# Patient Record
Sex: Male | Born: 1950
Health system: Southern US, Community
[De-identification: ages and names within clinical notes are randomized; demographics above are authoritative.]

## PROBLEM LIST (undated history)

## (undated) DIAGNOSIS — I10 Essential (primary) hypertension: Secondary | ICD-10-CM

## (undated) DIAGNOSIS — I509 Heart failure, unspecified: Secondary | ICD-10-CM

## (undated) DIAGNOSIS — J449 Chronic obstructive pulmonary disease, unspecified: Secondary | ICD-10-CM

## (undated) DIAGNOSIS — Z01818 Encounter for other preprocedural examination: Secondary | ICD-10-CM

## (undated) DIAGNOSIS — R490 Dysphonia: Secondary | ICD-10-CM

## (undated) DIAGNOSIS — E119 Type 2 diabetes mellitus without complications: Secondary | ICD-10-CM

## (undated) DIAGNOSIS — R053 Chronic cough: Secondary | ICD-10-CM

## (undated) DIAGNOSIS — I214 Non-ST elevation (NSTEMI) myocardial infarction: Secondary | ICD-10-CM

## (undated) DIAGNOSIS — R531 Weakness: Secondary | ICD-10-CM

## (undated) DIAGNOSIS — F17200 Nicotine dependence, unspecified, uncomplicated: Secondary | ICD-10-CM

## (undated) DIAGNOSIS — K219 Gastro-esophageal reflux disease without esophagitis: Secondary | ICD-10-CM

## (undated) DIAGNOSIS — N433 Hydrocele, unspecified: Secondary | ICD-10-CM

## (undated) DIAGNOSIS — I639 Cerebral infarction, unspecified: Secondary | ICD-10-CM

## (undated) DIAGNOSIS — Z923 Personal history of irradiation: Secondary | ICD-10-CM

## (undated) DIAGNOSIS — R0602 Shortness of breath: Secondary | ICD-10-CM

## (undated) DIAGNOSIS — R05 Cough: Secondary | ICD-10-CM

## (undated) DIAGNOSIS — R0600 Dyspnea, unspecified: Secondary | ICD-10-CM

## (undated) DIAGNOSIS — Z8679 Personal history of other diseases of the circulatory system: Secondary | ICD-10-CM

## (undated) DIAGNOSIS — C801 Malignant (primary) neoplasm, unspecified: Secondary | ICD-10-CM

## (undated) DIAGNOSIS — E7800 Pure hypercholesterolemia, unspecified: Secondary | ICD-10-CM

## (undated) DIAGNOSIS — I2510 Atherosclerotic heart disease of native coronary artery without angina pectoris: Secondary | ICD-10-CM

## (undated) HISTORY — DX: Personal history of irradiation: Z92.3

## (undated) HISTORY — PX: CORONARY ANGIOPLASTY: SHX604

## (undated) HISTORY — PX: CARDIAC CATHETERIZATION: SHX172

---
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
## 1998-08-18 HISTORY — PX: CARDIAC CATHETERIZATION: SHX172

## 1998-09-14 ENCOUNTER — Inpatient Hospital Stay (HOSPITAL_COMMUNITY): Admission: EM | Admit: 1998-09-14 | Discharge: 1998-09-16 | Payer: Self-pay | Admitting: Emergency Medicine

## 1998-09-14 ENCOUNTER — Encounter: Payer: Self-pay | Admitting: Emergency Medicine

## 1998-11-23 ENCOUNTER — Emergency Department (HOSPITAL_COMMUNITY): Admission: EM | Admit: 1998-11-23 | Discharge: 1998-11-23 | Payer: Self-pay | Admitting: Emergency Medicine

## 1999-01-07 ENCOUNTER — Inpatient Hospital Stay (HOSPITAL_COMMUNITY): Admission: EM | Admit: 1999-01-07 | Discharge: 1999-01-08 | Payer: Self-pay | Admitting: Emergency Medicine

## 1999-01-07 ENCOUNTER — Encounter: Payer: Self-pay | Admitting: Internal Medicine

## 1999-01-07 ENCOUNTER — Encounter: Payer: Self-pay | Admitting: Emergency Medicine

## 1999-02-02 ENCOUNTER — Encounter: Payer: Self-pay | Admitting: Internal Medicine

## 1999-02-02 ENCOUNTER — Ambulatory Visit (HOSPITAL_COMMUNITY): Admission: RE | Admit: 1999-02-02 | Discharge: 1999-02-02 | Payer: Self-pay | Admitting: Internal Medicine

## 1999-03-06 ENCOUNTER — Encounter: Payer: Self-pay | Admitting: Emergency Medicine

## 1999-03-07 ENCOUNTER — Inpatient Hospital Stay (HOSPITAL_COMMUNITY): Admission: EM | Admit: 1999-03-07 | Discharge: 1999-03-08 | Payer: Self-pay | Admitting: Emergency Medicine

## 1999-03-08 ENCOUNTER — Encounter: Payer: Self-pay | Admitting: Family Medicine

## 1999-03-12 ENCOUNTER — Encounter: Admission: RE | Admit: 1999-03-12 | Discharge: 1999-03-12 | Payer: Self-pay | Admitting: Family Medicine

## 2000-10-29 ENCOUNTER — Emergency Department (HOSPITAL_COMMUNITY): Admission: EM | Admit: 2000-10-29 | Discharge: 2000-10-30 | Payer: Self-pay | Admitting: *Deleted

## 2000-10-31 ENCOUNTER — Emergency Department (HOSPITAL_COMMUNITY): Admission: EM | Admit: 2000-10-31 | Discharge: 2000-10-31 | Payer: Self-pay | Admitting: Emergency Medicine

## 2001-02-15 ENCOUNTER — Ambulatory Visit (HOSPITAL_COMMUNITY): Admission: RE | Admit: 2001-02-15 | Discharge: 2001-02-15 | Payer: Self-pay | Admitting: Family Medicine

## 2002-03-12 ENCOUNTER — Encounter: Payer: Self-pay | Admitting: Emergency Medicine

## 2002-03-12 ENCOUNTER — Emergency Department (HOSPITAL_COMMUNITY): Admission: EM | Admit: 2002-03-12 | Discharge: 2002-03-12 | Payer: Self-pay | Admitting: Emergency Medicine

## 2005-04-02 ENCOUNTER — Inpatient Hospital Stay (HOSPITAL_COMMUNITY): Admission: EM | Admit: 2005-04-02 | Discharge: 2005-04-04 | Payer: Self-pay | Admitting: *Deleted

## 2005-04-02 ENCOUNTER — Ambulatory Visit: Payer: Self-pay | Admitting: Internal Medicine

## 2008-03-06 HISTORY — PX: THORACOTOMY/LOBECTOMY: SHX6116

## 2008-03-18 ENCOUNTER — Emergency Department (HOSPITAL_COMMUNITY): Admission: EM | Admit: 2008-03-18 | Discharge: 2008-03-18 | Payer: Self-pay | Admitting: Emergency Medicine

## 2008-03-21 ENCOUNTER — Emergency Department (HOSPITAL_COMMUNITY): Admission: EM | Admit: 2008-03-21 | Discharge: 2008-03-21 | Payer: Self-pay | Admitting: Emergency Medicine

## 2008-03-26 ENCOUNTER — Encounter: Admission: RE | Admit: 2008-03-26 | Discharge: 2008-03-26 | Payer: Self-pay | Admitting: Cardiothoracic Surgery

## 2008-03-26 ENCOUNTER — Ambulatory Visit: Payer: Self-pay | Admitting: Cardiothoracic Surgery

## 2008-04-04 ENCOUNTER — Encounter: Admission: RE | Admit: 2008-04-04 | Discharge: 2008-04-04 | Payer: Self-pay | Admitting: Cardiothoracic Surgery

## 2008-04-04 ENCOUNTER — Ambulatory Visit: Payer: Self-pay | Admitting: Cardiothoracic Surgery

## 2008-10-03 ENCOUNTER — Ambulatory Visit: Payer: Self-pay | Admitting: Cardiothoracic Surgery

## 2008-10-03 ENCOUNTER — Encounter: Admission: RE | Admit: 2008-10-03 | Discharge: 2008-10-03 | Payer: Self-pay | Admitting: Cardiothoracic Surgery

## 2009-03-11 ENCOUNTER — Ambulatory Visit: Payer: Self-pay | Admitting: Cardiothoracic Surgery

## 2009-03-11 ENCOUNTER — Encounter: Admission: RE | Admit: 2009-03-11 | Discharge: 2009-03-11 | Payer: Self-pay | Admitting: Cardiothoracic Surgery

## 2009-09-23 ENCOUNTER — Inpatient Hospital Stay (HOSPITAL_COMMUNITY)
Admission: EM | Admit: 2009-09-23 | Discharge: 2009-09-25 | Payer: Self-pay | Source: Home / Self Care | Admitting: Emergency Medicine

## 2009-09-23 ENCOUNTER — Ambulatory Visit: Payer: Self-pay | Admitting: Cardiology

## 2009-09-24 ENCOUNTER — Encounter (INDEPENDENT_AMBULATORY_CARE_PROVIDER_SITE_OTHER): Payer: Self-pay | Admitting: Internal Medicine

## 2009-09-25 ENCOUNTER — Encounter (INDEPENDENT_AMBULATORY_CARE_PROVIDER_SITE_OTHER): Payer: Self-pay | Admitting: Internal Medicine

## 2010-03-26 ENCOUNTER — Other Ambulatory Visit: Payer: Self-pay | Admitting: Internal Medicine

## 2010-04-01 LAB — CARDIAC PANEL(CRET KIN+CKTOT+MB+TROPI)
CK, MB: 3.4 ng/mL (ref 0.3–4.0)
CK, MB: 3.7 ng/mL (ref 0.3–4.0)
CK, MB: 4.1 ng/mL — ABNORMAL HIGH (ref 0.3–4.0)
Relative Index: 3.9 — ABNORMAL HIGH (ref 0.0–2.5)
Relative Index: INVALID (ref 0.0–2.5)
Relative Index: INVALID (ref 0.0–2.5)
Total CK: 106 U/L (ref 7–232)
Total CK: 84 U/L (ref 7–232)
Total CK: 88 U/L (ref 7–232)
Troponin I: 0.03 ng/mL (ref 0.00–0.06)
Troponin I: 0.03 ng/mL (ref 0.00–0.06)
Troponin I: 0.05 ng/mL (ref 0.00–0.06)

## 2010-04-01 LAB — COMPREHENSIVE METABOLIC PANEL
ALT: 16 U/L (ref 0–53)
ALT: 17 U/L (ref 0–53)
AST: 12 U/L (ref 0–37)
AST: 20 U/L (ref 0–37)
Albumin: 3.1 g/dL — ABNORMAL LOW (ref 3.5–5.2)
Albumin: 3.8 g/dL (ref 3.5–5.2)
Alkaline Phosphatase: 64 U/L (ref 39–117)
Alkaline Phosphatase: 75 U/L (ref 39–117)
BUN: 13 mg/dL (ref 6–23)
BUN: 14 mg/dL (ref 6–23)
CO2: 27 mEq/L (ref 19–32)
CO2: 27 mEq/L (ref 19–32)
Calcium: 8.2 mg/dL — ABNORMAL LOW (ref 8.4–10.5)
Calcium: 9.3 mg/dL (ref 8.4–10.5)
Chloride: 105 mEq/L (ref 96–112)
Chloride: 110 mEq/L (ref 96–112)
Creatinine, Ser: 0.89 mg/dL (ref 0.4–1.5)
Creatinine, Ser: 1.06 mg/dL (ref 0.4–1.5)
GFR calc Af Amer: >60 mL/min (ref >60)
GFR calc Af Amer: >60 mL/min (ref >60)
GFR calc non Af Amer: >60 mL/min (ref >60)
GFR calc non Af Amer: >60 mL/min (ref >60)
Glucose, Bld: 122 mg/dL — ABNORMAL HIGH (ref 70–99)
Glucose, Bld: 123 mg/dL — ABNORMAL HIGH (ref 70–99)
Potassium: 3.5 mEq/L (ref 3.5–5.1)
Potassium: 3.5 mEq/L (ref 3.5–5.1)
Sodium: 140 mEq/L (ref 135–145)
Sodium: 141 mEq/L (ref 135–145)
Total Bilirubin: 0.7 mg/dL (ref 0.3–1.2)
Total Bilirubin: 0.8 mg/dL (ref 0.3–1.2)
Total Protein: 6 g/dL (ref 6.0–8.3)
Total Protein: 7.1 g/dL (ref 6.0–8.3)

## 2010-04-01 LAB — ABO/RH: ABO/RH(D): B POS

## 2010-04-01 LAB — URINALYSIS, MICROSCOPIC ONLY
Bilirubin Urine: NEGATIVE
Glucose, UA: NEGATIVE mg/dL
Hgb urine dipstick: NEGATIVE
Ketones, ur: NEGATIVE mg/dL
Leukocytes, UA: NEGATIVE
Nitrite: NEGATIVE
Protein, ur: NEGATIVE mg/dL
Specific Gravity, Urine: 1.014 (ref 1.005–1.030)
Urobilinogen, UA: 1 mg/dL (ref 0.0–1.0)
pH: 8 (ref 5.0–8.0)

## 2010-04-01 LAB — PROTIME-INR
INR: 0.99 (ref 0.00–1.49)
INR: 1.1 (ref 0.00–1.49)
Prothrombin Time: 13.3 seconds (ref 11.6–15.2)
Prothrombin Time: 14.4 seconds (ref 11.6–15.2)

## 2010-04-01 LAB — LIPID PANEL
Cholesterol: 123 mg/dL (ref 0–200)
HDL: 45 mg/dL (ref >39)
LDL Cholesterol: 60 mg/dL (ref 0–99)
Total CHOL/HDL Ratio: 2.7 RATIO
Triglycerides: 91 mg/dL (ref <150)
VLDL: 18 mg/dL (ref 0–40)

## 2010-04-01 LAB — POCT CARDIAC MARKERS
CKMB, poc: 1.3 ng/mL (ref 1.0–8.0)
CKMB, poc: <1 ng/mL — ABNORMAL LOW (ref 1.0–8.0)
Myoglobin, poc: 77.3 ng/mL (ref 12–200)
Myoglobin, poc: 92 ng/mL (ref 12–200)
Troponin i, poc: <0.05 ng/mL (ref 0.00–0.09)
Troponin i, poc: <0.05 ng/mL (ref 0.00–0.09)

## 2010-04-01 LAB — CROSSMATCH
ABO/RH(D): B POS
Antibody Screen: NEGATIVE

## 2010-04-01 LAB — POCT I-STAT, CHEM 8
BUN: 14 mg/dL (ref 6–23)
Calcium, Ion: 1.06 mmol/L — ABNORMAL LOW (ref 1.12–1.32)
Chloride: 106 mEq/L (ref 96–112)
Creatinine, Ser: 1 mg/dL (ref 0.4–1.5)
Glucose, Bld: 123 mg/dL — ABNORMAL HIGH (ref 70–99)
HCT: 51 % (ref 39.0–52.0)
Hemoglobin: 17.3 g/dL — ABNORMAL HIGH (ref 13.0–17.0)
Potassium: 3.3 mEq/L — ABNORMAL LOW (ref 3.5–5.1)
Sodium: 140 mEq/L (ref 135–145)
TCO2: 25 mmol/L (ref 0–100)

## 2010-04-01 LAB — HEMOGLOBIN A1C
Hgb A1c MFr Bld: 5.8 % — ABNORMAL HIGH (ref <5.7)
Mean Plasma Glucose: 120 mg/dL — ABNORMAL HIGH (ref <117)

## 2010-04-01 LAB — DIFFERENTIAL
Basophils Absolute: 0.1 10*3/uL (ref 0.0–0.1)
Basophils Relative: 1 % (ref 0–1)
Eosinophils Absolute: 0.2 10*3/uL (ref 0.0–0.7)
Eosinophils Relative: 2 % (ref 0–5)
Lymphocytes Relative: 40 % (ref 12–46)
Lymphs Abs: 5.4 10*3/uL — ABNORMAL HIGH (ref 0.7–4.0)
Monocytes Absolute: 1.2 10*3/uL — ABNORMAL HIGH (ref 0.1–1.0)
Monocytes Relative: 9 % (ref 3–12)
Neutro Abs: 6.5 10*3/uL (ref 1.7–7.7)
Neutrophils Relative %: 48 % (ref 43–77)

## 2010-04-01 LAB — CBC
HCT: 40 % (ref 39.0–52.0)
HCT: 47.3 % (ref 39.0–52.0)
Hemoglobin: 13.6 g/dL (ref 13.0–17.0)
Hemoglobin: 16.2 g/dL (ref 13.0–17.0)
MCH: 30.3 pg (ref 26.0–34.0)
MCH: 30.4 pg (ref 26.0–34.0)
MCHC: 33.9 g/dL (ref 30.0–36.0)
MCHC: 34.2 g/dL (ref 30.0–36.0)
MCV: 88.8 fL (ref 78.0–100.0)
MCV: 89.5 fL (ref 78.0–100.0)
Platelets: 169 10*3/uL (ref 150–400)
Platelets: 213 10*3/uL (ref 150–400)
RBC: 4.47 MIL/uL (ref 4.22–5.81)
RBC: 5.32 MIL/uL (ref 4.22–5.81)
RDW: 15 % (ref 11.5–15.5)
RDW: 15.3 % (ref 11.5–15.5)
WBC: 13.5 10*3/uL — ABNORMAL HIGH (ref 4.0–10.5)
WBC: 9 10*3/uL (ref 4.0–10.5)

## 2010-04-01 LAB — TSH: TSH: 1.246 u[IU]/mL (ref 0.350–4.500)

## 2010-04-01 LAB — APTT: aPTT: 35 seconds (ref 24–37)

## 2010-04-01 LAB — RAPID URINE DRUG SCREEN, HOSP PERFORMED
Amphetamines: NOT DETECTED
Barbiturates: NOT DETECTED
Benzodiazepines: NOT DETECTED
Cocaine: NOT DETECTED
Opiates: NOT DETECTED
Tetrahydrocannabinol: NOT DETECTED

## 2010-04-01 LAB — ERYTHROPOIETIN: Erythropoietin: 30.3 m[IU]/mL (ref 2.6–34.0)

## 2010-04-01 LAB — SEDIMENTATION RATE: Sed Rate: 11 mm/hr (ref 0–16)

## 2010-04-01 LAB — CULTURE, BLOOD (ROUTINE X 2)
Culture: NO GROWTH
Culture: NO GROWTH

## 2010-04-01 LAB — BRAIN NATRIURETIC PEPTIDE: Pro B Natriuretic peptide (BNP): 53.8 pg/mL (ref 0.0–100.0)

## 2010-04-01 LAB — ANTI-NEUTROPHIL ANTIBODY

## 2010-04-01 LAB — MPO/PR-3 (ANCA) ANTIBODIES

## 2010-04-01 LAB — ANA: Anti Nuclear Antibody(ANA): NEGATIVE

## 2010-04-29 LAB — HEPATIC FUNCTION PANEL
ALT: 28 U/L (ref 0–53)
AST: 16 U/L (ref 0–37)
Albumin: 2.6 g/dL — ABNORMAL LOW (ref 3.5–5.2)
Alkaline Phosphatase: 82 U/L (ref 39–117)
Bilirubin, Direct: 0.1 mg/dL (ref 0.0–0.3)
Indirect Bilirubin: 0.5 mg/dL (ref 0.3–0.9)
Total Bilirubin: 0.6 mg/dL (ref 0.3–1.2)
Total Protein: 6 g/dL (ref 6.0–8.3)

## 2010-04-29 LAB — POCT I-STAT, CHEM 8
BUN: 14 mg/dL (ref 6–23)
Calcium, Ion: 1.17 mmol/L (ref 1.12–1.32)
Chloride: 103 mEq/L (ref 96–112)
Creatinine, Ser: 0.9 mg/dL (ref 0.4–1.5)
Glucose, Bld: 118 mg/dL — ABNORMAL HIGH (ref 70–99)
HCT: 39 % (ref 39.0–52.0)
Hemoglobin: 13.3 g/dL (ref 13.0–17.0)
Potassium: 3.7 mEq/L (ref 3.5–5.1)
Sodium: 138 mEq/L (ref 135–145)
TCO2: 29 mmol/L (ref 0–100)

## 2010-04-29 LAB — DIFFERENTIAL
Basophils Absolute: 0.1 10*3/uL (ref 0.0–0.1)
Basophils Relative: 1 % (ref 0–1)
Eosinophils Absolute: 0.6 10*3/uL (ref 0.0–0.7)
Eosinophils Relative: 6 % — ABNORMAL HIGH (ref 0–5)
Lymphocytes Relative: 15 % (ref 12–46)
Lymphs Abs: 1.7 10*3/uL (ref 0.7–4.0)
Monocytes Absolute: 0.7 10*3/uL (ref 0.1–1.0)
Monocytes Relative: 6 % (ref 3–12)
Neutro Abs: 7.7 10*3/uL (ref 1.7–7.7)
Neutrophils Relative %: 72 % (ref 43–77)

## 2010-04-29 LAB — CBC
HCT: 37.3 % — ABNORMAL LOW (ref 39.0–52.0)
Hemoglobin: 12.8 g/dL — ABNORMAL LOW (ref 13.0–17.0)
MCHC: 34.3 g/dL (ref 30.0–36.0)
MCV: 89.8 fL (ref 78.0–100.0)
Platelets: 317 10*3/uL (ref 150–400)
RBC: 4.16 MIL/uL — ABNORMAL LOW (ref 4.22–5.81)
RDW: 13.8 % (ref 11.5–15.5)
WBC: 10.8 10*3/uL — ABNORMAL HIGH (ref 4.0–10.5)

## 2010-04-29 LAB — GLUCOSE, CAPILLARY: Glucose-Capillary: 117 mg/dL — ABNORMAL HIGH (ref 70–99)

## 2010-04-29 LAB — LIPASE, BLOOD: Lipase: 37 U/L (ref 11–59)

## 2010-06-01 NOTE — Assessment & Plan Note (Signed)
OFFICE VISIT   Jonathon [name], Jonathon [name] A  DOB:  1950-08-17                                        October 03, 2008  CHART #:  [id number]   CURRENT PROBLEMS:  1. Status post right thoracotomy and stapling of blebs for spontaneous      pneumothorax in February 2010 (done in [place]).  2. Hypertension.  3. 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   PRESENT ILLNESS:  The patient is a 60 year old smoker who returns for a  46-month follow up with x-ray and CT scan.  He has had no recurrent  symptoms of pneumothorax since his thoracotomy and surgery in [place]  earlier this spring.  He continues to smoke half a pack at least daily.  He denies any shortness of breath, productive cough or hemoptysis.  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  He states the blood pressure medication he takes makes him  feel groggy and weak, and he cannot work while taking the medication so  he has been noncompliant with that.  He returns now to have a CT scan  review to assess the bullous nature of his left lung.   MEDICATIONS:  HCTZ 25 mg a day, oxycodone 5/325 mg daily for thoracotomy  pain, Advil one daily for pain.   PHYSICAL EXAMINATION:  Vital Signs:  His blood pressure is 170/90, pulse  is 60 and regular, saturation on room air is 96%.  General:  He is alert  and oriented.  Lungs:  Breath sounds are distant but clear.  Chest:  He  has a well-healed right thoracotomy incision.  Cardiac:  Rhythm is  regular without murmur.  Extremities:  No edema or tenderness.   A CT scan of the chest is performed which shows a well-healed right lung  following bleb resection with some postoperative changes, but no  nodularity.  The left lung has significant bullous emphysema of the  upper lobe.  There is no significant shift or mediastinal compression.  The left lung has no abnormal nodularity or mass.   IMPRESSION AND PLAN:  I  would recommend surgery to remove blebs in his  left lung without a significant medical reason such as pneumothorax or  progressive symptoms of COPD with oxygen dependence which he does not  have.  I explained to him that if he is smoking then he may become  oxygen dependent by the time he turns 64 and at this point he says, he  needs to smoke to help control his nerves.  I plan on seeing him back in  6 months with a chest x-ray.   ADDENDUM:  The patient is still having post thoracotomy discomfort and I  gave him a prescription for Lortab 5.0 to take 1 daily and I have  provided him with two refills to last until approximately 1 year postop.   Kerin Perna, [name]  Electronically Signed   PV/MEDQ  D:  10/03/2008  T:  10/04/2008  Job:  [id number]   cc:   Tracey Harries, [name]

## 2010-06-01 NOTE — Consult Note (Signed)
NEW PATIENT CONSULTATION   Koike, Coral A  DOB:  [birth year]                                        March 26, 2008  CHART #:  [id number]   REASON FOR CONSULTATION:  Status post right thoracotomy and stapling of  blebs for spontaneous right pneumothorax in [place] on March 06, 2008.   PRESENT ILLNESS:  The patient presents to the office for surgical  followup after undergoing a recent thoracic surgical procedure while he  was visiting in [place].  He has a 40 pack-year history of smoking and  he had a sudden onset of right chest discomfort while visiting a family  for wedding in [place].  The chest x-ray showed a partial right  pneumothorax and he was scheduled for surgery the next day.  He had a  right thoracotomy and stapling of blebs according to his history,  although medical records are being requested.  He had 2 chest tubes and  apparently, had air leak for approximately 1 week.  The chest tubes were  removed after 7 days and he was discharged home on the 8th day.  Since  surgery, he has had post thoracotomy pain and some discomfort with  swallowing in the epigastrium.  She denies fever, productive cough, and  hemoptysis.  He has stopped smoking..   CURRENT MEDICATIONS:  Hydrochlorothiazide, oxycodone, and Advil.   PAST MEDICAL HISTORY:  1. Hypertension.  2. No known drug allergies.  3. COPD with smoking.   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  He  stopped smoking after his surgery.  He does not drink significant  amounts of alcohol.   REVIEW OF SYSTEMS:  The patient has been seen in the [facility]  Emergency Department complaining of incisional discomfort and receiving  pain medication since his return to [place] from his surgery in  [place].  A CT scan was performed last week, which ruled out PE.  It did  show that he has no significant pathology in the right chest after his  surgery, specifically no  pneumothorax or pleural effusion.  There are  some minor areas of postoperative change.  The left lung of note has  significant apical bullae.  There is no suspicious pulmonary nodularity  in his lungs.  We do not know the pathology report of the specimen  removed in surgery at [place].  The patient has had stress tests  approximately 3 years ago, which he states were negative.  He has had  Dr. Windle Guard as a primary care physician intermittently.  Otherwise, he denies blood per rectum, diabetes, stroke, arrhythmia,  edema, or significant past trauma.  He states he has had an inguinal  hernia repair in the past, which he tolerated well without anesthetic  complications.   ALLERGIES:  He is now allergic to any medications.  He is right-hand  dominant.   PHYSICAL EXAMINATION:  VITAL SIGNS:  Blood pressure 140/95, pulse 60,  respirations 18, and saturation 97% on room air.  GENERAL:  He is a middle-aged male, accompanied by his wife, in no acute  distress.  HEENT:  Normocephalic.  NECK:  Without mass or adenopathy.  LUNGS:  Breath sounds are clear bilaterally.  He has a well-healed right  posterolateral thoracotomy incision and 2 chest tube sites, which are  also healing well.  There does appear to be a fluid collection or seroma  in the thoracotomy incision.  CARDIAC:  Rhythm is regular without murmur.  ABDOMEN:  Soft, benign.  EXTREMITIES:  Mild clubbing, but no cyanosis or edema.  Peripheral  pulses are intact.   LABORATORY DATA:  Chest x-ray taken today shows no right-sided  pneumothorax.  There is some mild subcutaneous air still remaining in  the right chest wall.  Cardiac silhouette is normal.   IMPRESSION AND PLAN:  The patient has had atypical postoperative course  following thoracotomy.  He states he has lost 15-20 pounds.  He still  has incisional pain.  He is somewhat weak.  He is interested in his  activity levels and what to expect.   We will plan on obtaining  his operative records from [place].  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  He knows not to lift more than 10 pounds.  I have  provided him with a prescription for Percodan (40 tablets),  hydrochlorothiazide 25 mg (30 tablets), and Ultram 50 mg (30 tablets).   An attempted aspiration of the fluid in his thoracotomy incision was  made under sterile prep and local 1% lidocaine; however, no fluid could  be removed.   The patient will plan on returning with a chest x-ray on April 04, 2008.   Kerin Perna, [name]  Electronically Signed   PV/MEDQ  D:  03/26/2008  T:  03/27/2008  Job:  [id number]

## 2010-06-01 NOTE — Assessment & Plan Note (Signed)
OFFICE VISIT   Jonathon Jonathon [name], Jonathon Jonathon [name]  DOB:  09-Aug-1950                                        April 04, 2008  CHART #:  [id number]   CURRENT PROBLEMS:  1. Status post right thoracotomy and stapling of blebs for spontaneous      pneumothorax in [place] on March 06, 2008.  2. Hypertension.  3. Chronic obstructive pulmonary disease with recent smoking      cessation.   PRESENT ILLNESS:  The patient is Jonathon [name] 60 year old ex-smoker, who was  receiving postoperative followup in our office after having emergency  right thoracotomy and bleb resection while on vacation in [place] last  month.  He was seen 10 days ago and is better since his last visit.  He  has less thoracotomy pain, less shortness of breath, increased strength.  No shortness of breath or productive cough.  He is taking  hydrochlorothiazide for his blood pressure and oxycodone and Advil for  pain.   PHYSICAL EXAMINATION:  VITAL SIGNS:  Blood pressure is 180/100, pulse  60, respirations 18, and saturation 97%.  LUNGS:  Breath sounds are clear and equal.  [name]:  The thoracotomy incision is healing well.  CARDIAC:  Rhythm is regular.  EXTREMITIES:  There is no peripheral edema.   PA and lateral [name] x-ray was performed, which show mild postoperative  changes in right lung, but no pleural effusion.  No pneumothorax and no  infiltrate.   PLAN:  The patient is still having some post-thoracotomy pain and I have  provided him with Jonathon Jonathon [name] as his symptoms right now  seem mild to moderate.  His blood pressure is poorly controlled and he  is in the process of reestablishing with Jonathon Jonathon [name], so I  have given him Jonathon Jonathon [name] 10 mg Jonathon Jonathon [name] to take with  his hydrochlorothiazide and told he could establish with primary care to  treat his hypertension.   On his CT scan taken in [place], he still has significant bullous  disease in the contralateral lower  left lung and he is at risk for Jonathon [name]  pneumothorax on that side.  He knows if symptoms arise, he should call  our office or report to the [facility] Emergency Department if after hours or  on the weekend.  Jonathon Jonathon [name] in 6 months will be  scheduled mainly to assess the bullous disease in his left lung.   Kerin Perna, [name]  Electronically Signed   PV/MEDQ  D:  04/04/2008  T:  04/05/2008  Job:  [id number]   cc:   Windle Guard, [name]

## 2010-06-01 NOTE — Assessment & Plan Note (Signed)
OFFICE VISIT   HILARY, PUNDT A  DOB:  09/08/1950                                        March 11, 2009  CHART #:  [id number]   CURRENT PROBLEMS:  1. Status post right thoracotomy stapling of blebs for spontaneous      pneumothorax in February 2010, ([place]).  2. Hypertension.  3. COPD with bullous emphysema involving the left upper lung field.   PRESENT ILLNESS:  The patient is a 60 year old [name] male who returns  for followup 1 year after undergoing a thoracotomy for resection of  blebs, which cause a right pneumothorax.  He also has significant  bullous disease in his left upper lung.  He smokes a pack of cigarettes  a day and states he cannot quit.  He has daily mild shortness of breath  but is able to work full time up to 8 to 10 hours a day.  A CT scan of  the chest performed 6 months ago showed no evidence of cancer with a  well-healed right lung and a left lung with bullous changes at the apex.  His FEV-1 is 1.3 and his FVC is 4.3 by office spirometry.  His oxygen  saturation is 99% on room air.  He basically has no significant  pulmonary complaints only mild postthoracotomy pain on the right.   PHYSICAL EXAMINATION:  Vital Signs:  Blood pressure is 150/90, pulse 72,  respirations 18, saturation 99%.  He is afebrile.  Weight is 214 pounds.  General:  He is a middle-aged middle [place] male in no acute distress.  Chest:  Breath sounds are slightly diminished bilaterally.  He has a  well-healed right thoracotomy scar.  Cardiac:  Rhythm is regular and he  has no peripheral edema.   PA and lateral chest x-ray shows no change in the evidence of COPD with  bullous lucency in the left upper lung field.  No pleural effusion.  No  mass.   IMPRESSION AND PLAN:  The patient now 1 year after surgery.  He still  has some postthoracotomy discomfort, and I have provided with no refill  prescription for tramadol.  He will return to the care of  his primary  care physician and return here as needed for any thoracic surgical  concerns.  I did review with him recommendations for reporting symptoms  of recurrent pneumothorax immediately to our office or presenting to the  [facility] Emergency Room after hours.   Kerin Perna, [name]  Electronically Signed   PV/MEDQ  D:  03/11/2009  T:  03/12/2009  Job:  [id number]   cc:   Tracey Harries, [name]

## 2010-06-04 NOTE — Discharge Summary (Signed)
Jonathon [name], Jonathon [name] NO.:  [id number]   MEDICAL RECORD NO.:  [id number]          PATIENT TYPE:  INP   LOCATION:  3742                         FACILITY:  [facility]   PHYSICIAN:  Duke Salvia, [name]  DATE OF BIRTH:  Jul 27, 1950   DATE OF ADMISSION:  04/02/2005  DATE OF DISCHARGE:  04/04/2005                           DISCHARGE SUMMARY - REFERRING   DISCHARGING PHYSICIAN:  Dr. Samule Ohm.   BRIEF HISTORY:  Mr. Niazi is a 60 year old [place] immigrant who  presented with substernal chest discomfort radiating to his neck and back  associated with shortness of breath and diaphoresis.  EKG showed T-wave  inversion in the inferolateral leads.   PAST MEDICAL HISTORY:  Notable for tobacco use, hypertension.   LABORATORY:  Admission H&H of 16.6 and 48.3, normal indices, platelets 222,  WBC is 8.4.  Subsequent hematologies were essentially unremarkable.  Admission PTT 31, PT 12.9, sodium 136, potassium 3.6, BUN 11, creatinine  0.9.  Subsequent chemistry on the 19th showed a potassium of 3.3. Hemoglobin  A1c was 5.8.  CK-MBs and relative indexes were negative for myocardial  infarction.  Fasting lipids showed total cholesterol 118, triglycerides 35,  HDL 42, LDL 69.  TSH 0.842. Chest x-ray showed emphysema without acute  disease.  Adenosine Myoview showed an EF of 54%, no signs of ischemia.   HOSPITAL COURSE:  Mr. Revak was admitted to [facility].  Overnight he did not have any further chest discomfort.  The patient refused  cath thus an inpatient Myoview was arranged. He had ruled out for myocardial  infarction. Adenosine Myoview was performed by Lavella Hammock with the above-  mentioned results. Tobacco cessation consult was also performed. Given the  negative adenosine Myoview results, it was felt that the patient could be  discharged home with outpatient follow-up.   DISCHARGE DIAGNOSES:  1.  Chest discomfort of uncertain etiology.  2.  Tobacco  use.  3.  Hypertension.   DISPOSITION:  He is discharged home after maintaining a low fat diet.  He is  asked to go to [facility] to pick up his prescriptions that they could be less  than $4.   MEDICATIONS:  1.  Coated aspirin 81 daily.  2.  Pepcid OTC daily.  3.  Nitroglycerin 0.4 p.r.n.  4.  Lisinopril/HCTZ 20/12.5 mg daily.  5.  Potassium 10 mEq daily.   He will follow up with Dr. Gala Romney as needed and he was asked to obtain a  primary care physician.  He was advised no tobacco use.      Joellyn Rued, P.A. [facility]    ______________________________  Duke Salvia, [name]    EW/MEDQ  D:  07/20/2005  T:  07/20/2005  Job:  [id number]

## 2010-06-04 NOTE — H&P (Signed)
NAMESUHEYB, RAUCCI NO.:  [id number]   MEDICAL RECORD NO.:  [id number]          PATIENT TYPE:  EMS   LOCATION:  MAJO                         FACILITY:  [facility]   PHYSICIAN:  Duke Salvia, [name]  DATE OF BIRTH:  09/13/1950   DATE OF ADMISSION:  04/02/2005  DATE OF DISCHARGE:                                HISTORY & PHYSICAL   HISTORY OF PRESENT ILLNESS:  Mr. Meiklejohn is seen at the request of Dr.  Lynelle Doctor in the ER because of chest pain.   He is a 60 year old [place] immigrant who has a strong smoking  history and stress incontinence exercise limitations noted by shortness of  breath at the top of stairs.  He has not had any chest discomfort at the top  of the stairs.   This morning, he awakened with a severe substernal chest discomfort  radiating into neck and into his back.  It is now markedly improved.  It was  accompanied by shortness of breath and diaphoresis.  He presented to the  emergency room with the aforementioned complaints.  Electrocardiogram was  interpreted as showing ST segment depression and T wave inversions in the  inferolateral leads.  ST segment elevation of about 1 mm or 0.5 mm was not  reported to me.   Currently, as noted, the patient is pain free.   CARDIAC RISK FACTORS:  1.  Cigarettes.  2.  Hypertension.   He does not have medical care because he has no insurance and cannot afford  it.   REVIEW OF SYSTEMS:  Otherwise negative.   PAST SURGICAL HISTORY:  Notable for a catheterization done about 6 years ago  for chest pain that was severe, but he thinks different from this.   SURGICAL HISTORY:  1.  Catheterization.  2.  Inguinal herniorrhaphy.   SOCIAL HISTORY:  He is married and has a son.  He smoke.  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   ALLERGIES:  No known drug allergies.   PHYSICAL EXAMINATION:  GENERAL:  He is a [place] appearing gentleman  who looks older than his stated age of 32.  VITAL  SIGNS:  His blood pressure is ranging from 156-160/84-90.  His heart  rate has been from 56-65.  His respirations are 18 and unlabored.  HEENT:  Notable for icterus or xanthoma.  NECK:  His neck veins were 8-9 cm.  His carotids were brisk and full  bilateral without bruits.  Neck was without kyphosis or scoliosis.  LUNGS:  Clear.  HEART:  Heart sounds were regular without murmurs or gallops.  ABDOMEN:  Soft with active bowel sounds without midline pulsation or  organomegaly.  EXTREMITIES:  Femoral pulses were 2+.  Distal pulses were intact.  There was  no clubbing, cyanosis, or edema.  NEUROLOGIC:  Grossly normal.   Electrocardiogram dated today at 0840 hours demonstrated sinus rhythm at 54  beats per minute.  Intervals were 0.33/0.09/0.44.  There was ST segment  elevation in leads V1 to V4, which was evident in 2004.  ST segment  depression and T wave inversions are clearly  worse now than in 2004.  ST  segment elevation and a pathology-appearing Q wave in lead aVL are new since  2004.  The electrocardiogram x1 hour later or so showed perhaps mild  worsening of the ST segment depression in lead aVF.  Further prolongation of  the QT interval.   IMPRESSION:  1.  Chest pain consistent with acute coronary syndrome.  2.  Electrocardiogram demonstrating ST segment in lead L with ST segment      depression and T wave inversions.  3.  Cardiac risk factors notable for (a) hypertension, (b) cigarette use.  4.  Bradycardia - relative.  5.  Class II to III congestive heart failure versus chronic obstructive      pulmonary disease - chronic.  6.  Chronic obstructive pulmonary disease, emphysema.   DISCUSSION:  Mr. Mozer presents with chest pain and an  electrocardiogram concerning for an acute lateral wall myocardial  infarction.  He is currently almost pain free with nitroglycerin, narcotics  and heparin.  I will repeat the electrocardiogram.  If ST segment elevation  persists, I think  we may be obligated to proceed, notwithstanding the  resolution of his chest discomfort.   PLAN:  1.  Continue current medications.  2.  Narcotic support.  3.  Hemoglobin A1C, TSH, FLP.  4.  Risk factor modification.  5.  Case management consultation for support.  6.  Catheterization with timing to be determined.           ______________________________  Duke Salvia, [name]     SCK/MEDQ  D:  04/02/2005  T:  04/04/2005  Job:  [id number]

## 2010-06-04 NOTE — H&P (Signed)
[place]  Patient:    Jonathon [name], Jonathon [name]                    MRN: [id number] Adm. Date:  [id number] Attending:  Willow Ora Dictator:   Nolon Nations, [name]                         History and Physical  PROBLEM LIST: 1. Chest pain. 2. Cardiac catheterization 80 with mild nonobstructive coronary artery disease,    ejection fraction 58%, 25% proximal left anterior descending artery, 20% mid    circumflex. 3. Tobacco use. 4. Hypertension. 5. Anxiety and depression. 6. Lower lobe granuloma. 7. Laryngeotracheal nodule by CT. 8. Emphysema.  HISTORY OF PRESENT ILLNESS:  Jonathon [name] is a 60 year old male with history f chest pain, isolated elevated troponin, presents to the emergency department with two hours of severe, pressure-like chest pain.  The pain started at 7 p.m. after eating at work.  It was associated with nausea with radiation to neck, palpitation, shortness of breath, and wheezing.   There as no diaphoresis.  He has a  two-month history of similar pain which has been milder and always following ______ .  He notices symptoms especially with chocolate and caffeine.  He also has a history of admissions in August and December 2000 for similar noncardiac chest pain.  The cardiac catheterization revealed nonobstructive coronary artery disease, and he was discharged in both situations with diagnosis of noncardiac chest pain.  He has 100-pack-year history of smoking and has COPD by x-ray.  His cardiac risk factors are hypertension, smoking, male.  MEDICATIONS: 1. Norvasc 10 mg p.o. q.d. 2. Prevacid 50 mg p.o. b.i.d. 3. Aspirin 325 mg q.d. 4. Paxil 20 mg q.d.  ALLERGIES:  No known drug allergies.  FAMILY HISTORY:  Asthma in the father.  Mother and siblings noncontributory.  SOCIAL HISTORY:  He works as a Conservation officer, nature at [employer].  He has four children from a prior marriage.  He lives with them and his fiance.  He  has a 100-pack-year history of tobacco.  he denies alcohol, denies IV drug use.  He has lived her for 10 years, originally from [place].  He denies TB exposure.  REVIEW OF SYSTEMS:  Negative.  No visual changes, fever, chills, night sweats, weight loss, emesis.  Positive history of cough, shortness of breath, wheezing,  fatigue, indigestion, nausea, palpitations.  PHYSICAL EXAMINATION:  VITAL SIGNS:  Blood pressure 158/97 to 142/86, pulse 97 to 76, respiratory rate 28 to 20.   O2 saturation 98.5 to 98% on room air.  Temperature 97.4.  GENERAL:  No acute distress, alert and oriented, hoarse voice.  HEENT:  PERRLA.  EOMI.  Oropharynx clear.  NECK:  Supple.  No lymphadenopathy, no thyromegaly.  TMs clear.  CARDIOVASCULAR:  Normal S1, S2.  No murmurs, rubs, or gallops.  LUNGS:  Poor air movement at the bases.  Wheezes mid lungs bilaterally.  ABDOMEN:  Positive bowel sounds, nontender, nondistended.  No hepatosplenomegaly.  NEUROLOGIC:  No focal defects.  EXTREMITIES:  Good peripheral pulses, no edema.  LABORATORY DATA:  White count 11.3, hemoglobin 15.5, hematocrit 44.7, platelets  255.  BMET: 141 chloride 105, potassium 3.8, BUN 13, glucose 94.  Troponin 2.61, CK 167, MB 2.2.  EKG: Poor R wave progression, anterior leads.  No ST-T changes.  Chest x-ray: COPD, no active disease.  ASSESSMENT/PLAN:   A 60 year old male with chest  pain.  1. Chest pain.  It does not appear cardiac in origin.  He has a history of    elevated troponin.  The patient appears to have severe reflex esophagitis that    causes bronchospasm or asthmatic bronchitis.  His chest pain did respond to    albuterol.  Will rule out for an MI with serial enzymes and EKG. 2. Gastroesophageal reflux disease.  Prevacid.  Will consider a GI workup. 3. Emphysema.  Albuterol nebulizers as needed.  Consider Atrovent and pulmonary    function tests. 4. Laryngeal nodule.  CT of the neck done after discharge on  January 17 revealed    a midline structure emanating off the anterior wall at proximal trachea below    the vocal cords.  It is thought to have been likely pathologically significant    but not a normal variant.  Followup is already scheduled as an outpatient for    the end of this month. 5. Depression.  Will continue Paxil.  Anxiety likely exacerbated by medical    problems. 6. Hypertension.  Will continue Norvasc. 7. Tobacco abuse.  Will urge cessation. 8. Fatigue.  Will check TSH.  It is concerning given his laryngeotracheal nodule. DD:  03/07/99 TD:  03/07/99 Job: [id number]

## 2010-06-04 NOTE — Discharge Summary (Signed)
[place]  Patient:    Jonathon [name], Jonathon [name]                    MRN: [id number] Adm. Date:  [id number] Disc. Date: 03/08/99 Attending:  Willow Ora Dictator:   Nolon Nations, [name] CC:         Nolon Nations, [name] at the [facility]                           Discharge Summary  DATE OF BIRTH:  1950/07/29  ADMISSION DIAGNOSES: 1. Chest pain. 2. Tobacco abuse. 3. Hypertension. 4. Anxiety/depression. 5. Left lower lobe granuloma. 6. Laryngeal/tracheal nodule by CT. 7. Emphysema.  DISCHARGE DIAGNOSES: 1. Noncardiac chest pain. 2. Tobacco abuse. 3. Hypertension. 4. Anxiety/depression. 5. Left lower lobe granuloma. 6. Laryngeal/tracheal nodule by CT. 7. Emphysema.  CONSULTATIONS:  None.  PROCEDURES:  None.  HISTORY OF PRESENT ILLNESS:  Mr. Mannis is a 60 year old male with a history of chest pain and an isolated elevated troponin who presents to the ED after two-hour episode of severe pressure-like chest pain. The pain started at 7 p.m after eating at work. It was associated with nausea, radiation to the neck, palpitations, severe shortness of breath, wheezing, with no diaphoresis. He does have a history of previous hospitalizations in August and December of 2000 for similar chest pain with isolated troponins that was ruled to be noncardiac in origin. He has had a one-and-a-half-month history of similar pain since his last discharge. The pain occurs following meals and a nap. He notices symptoms with chocolate and caffeine. He also has a 100-pack-year history of smoking. Please refer to the admit note for a more complete history and physical.  HOSPITAL COURSE: #1 - CHEST PAIN:  The patient was ruled out for an MI with cardiac enzymes and EKG. His EKG was normal. He does have elevated troponins similar to his previous hospitalizations. His troponins were 3.61, 4.25, and 4.17. His CK-MBs were normal, and his  EKG showed no signs of an MI or ischemia. He had no chest pain after admission. His cardiac pain was thought to be more likely related to reflux disease with the potential of subsequent bronchospasm or otherwise esophageal or coronary artery spasm. Please see below for the GI workup.  The patient had been hospitalized in both August and December of 2000 for a similar chest pain. Cardiology was consulted in both instances and his etiology of this isolated troponin and chest pain was thought to be noncardiac in origin. A cardiac catheterization was done in August of 2000 which showed mild nonobstructive coronary artery disease with a 25% lesion in the proximal LAD, a 20% lesion in the mid circumflex, and an EF of 68%.  #2 - GERD:  The patient does have a history of heartburn. His chest pain appears to be related to heartburn as it tends to occur after large meals followed by a nap. An upper GI study was performed while in-house which on a preliminary report revealed a normal upper GI. The final report is pending. The patient was switched to Protonix 40 mg q.d. while in-house and will be discharged with this after his hospitalization.  #3 - COPD:  The patient has a 100-pack-year history of smoking. He is currently smoking about three packs per day. COPD is evident on x-ray with emphysematous changes. The patient was counseled extensively on the importance of smoking cessation.  He agrees to attempt to quit smoking. However, he is hesitant to set an absolute quit date given his long history of smoking. He says that he will have much difficulty breaking the habit given how routine it is since the age of 66. He is willing to try both the patch and Wellbutrin. However, as he is unable to absolutely quit smoking at this point and time, the use of the patch would be contraindicated. The patient was counseled to this effect and the potential for decreasing his smoking to one pack a day for the next  two weeks and then setting a quit date at a follow-up visit is planned. The patient was started on Combivent 2 puffs b.i.d. while in-house ______ in his COPD. PFTs would be indicated in the future.  #4 - HYPERTENSION:  The patient was maintained on his Norvasc 10 mg p.o. q.d. His hypertension is well controlled.  #5 - ANXIETY/DEPRESSION:  The patient is treated with Paxil 20 mg q.d. This was maintained during his hospital stay. The patient was also started on Wellbutrin as described above to attempt to aid in smoking cessation.  #6 - LARYNGEAL/TRACHEAL NODULE BY CT:  The patient was found to have a nodule on a CT performed in December of 2000. A multiplanar reconstruction CT on January 16 revealed a midline structure emanating off the anterior wall of the proximal trachea, below the vocal cords. It was thought to be unlikely pathologically significant but not known to be a normal variant. A follow-up was recommended. The patient has a follow-up scheduled with Veverly Fells. Arletha Grippe, [name] of ENT. He reports that that visit is scheduled for the 26th of this month.  CONDITION ON DISCHARGE:  Good.  DISPOSITION:  Discharged to home.  DISCHARGE MEDICATIONS: 1. Norvasc 10 mg p.o. q.d. 2. Paxil 20 mg q.d. 3. Protonix 40 mg q.d. 4. Wellbutrin 100 mg b.i.d. 5. Combivent MDI 2 puffs b.i.d. 6. The patient is instructed to stop his Prevacid dose from before and is    encouraged to continue smoking cessation and discussion of nicotine patch    in the future.  INSTRUCTIONS:  The patient was instructed to begin exercising 20 to 30 minutes four to five times per week. He was instructed to avoid fatty or fried foods, as well as chocolate and caffeine. He was told that he should not be reclining or sleeping immediately after eating as this will exacerbate his gastroesophageal reflux disease.  FOLLOW-UP: 1. With Nolon Nations, [name] at the [facility] on March 23, 1999    at 2 p.m. The  following issues would be indicated for discussion at    follow-up:  Smoking cessation and the use of a nicotine patch, PFTs for     COPD diagnosis and management, further evaluation of his reflux disease and    assessment of the final report from the upper GI, the improvement of    symptoms associated with Combivent for COPD, results from ENT follow-up. 2. Follow up with ENT, Veverly Fells. Arletha Grippe, [name] on February 26 for assessment    of nodule. DD:  03/08/99 TD:  03/08/99 Job: [id number]

## 2010-06-04 NOTE — H&P (Signed)
[place]. [facility]  Patient:    Jonathon [name], Jonathon [name]                    MRN: [id number] Adm. Date:  [id number] Attending:  Willow Ora Dictator:   Nolon Nations                         History and Physical  DATE OF BIRTH:  09-Feb-1950  CHIEF COMPLAINT:  Chest pain.  PROBLEM LIST: 1. Chest pain. 2. History of cardiac catheterization August 2000 with mild nonobstructive coronary    artery disease, ejection fraction 68%, 25% proximal LAD, 20% mid circumflex. 3. Tobacco abuse. 4. Hypertension. 5. Anxiety/depression. 6. Left lower lobe granuloma. 7. Laryngeal/tracheal nodule. 8. Emphysema.  HISTORY OF PRESENT ILLNESS:  Mr. Bertha is a 60 year old male with history f chest pain and an isolated elevated troponin who presents to the ED after two-hour episode of severe pressure-like chest pain. It started at 7 p.m after eating at  work. He had some associated nausea, radiation in the neck, palpitations, severe shortness of breath, wheezes. He denies any diaphoresis.  He has a two-month history of similar pain which is milder and always following  meals ______. He notes that the symptoms are associated with chocolates and caffeine.  He was previously admitted in August as well as December of 2000 for a similar chest pain. He was ruled out for an MI in both hospitalizations and found to have a non-cardiac related chest pain. He elevated troponins at both instances and had an essentially negative cardiac catheterization in August of 2000.  He has a 100-pack-year history of smoking and COPD by x-ray.  CARDIAC RISK FACTORS:  Hypertension, smoking, male.  CURRENT MEDICATIONS: 1. Norvasc 10 mg p.o. q.d. 2. Prevacid 15 mg p.o. b.i.d. 3. Aspirin 325 mg q.d. 4. Paxil 20 mg q.d.  ALLERGIES:  No known allergies.  SOCIAL HISTORY:  The patient is engaged. Lives with fiancee and four children. Positive smoking history, 100-pack-year  history. Denies IV drugs. Reports one drink per year.  FAMILY HISTORY:  Father with asthma. Mother and siblings without any significant history. DD:  03/07/99 TD:  03/07/99 Job: [id number]

## 2010-06-04 NOTE — Discharge Summary (Signed)
[place]. [facility]  Patient:    Jonathon [name]                     MRN: [id number] Adm. Date:  [id number] Disc. Date: 01/08/99 Attending:  Phifer, Jonathon Sine Welcome Dictator:   Jonathon [name], [name] CC:         Jonathon [name], [name] [facility]             Jonathon [name], [name]             Jonathon [name], [name]                           Discharge Summary  DATE OF BIRTH:  1950/01/27.  DISCHARGE DIAGNOSES: 1. Chest pain with elevated troponins and nonelevated CKs.  Cardiac catheterization    in August of 2000 revealed mild amount of obstructive coronary artery disease    with an ejection fraction of 68%.  He had 25% proximal left anterior descending    coronary artery and a 20% mid-circumflex. 2. Longstanding heavy tobacco abuse. 3. Depression. 4. Hypertension. 5. Status post left inguinal hernia repair.  DISCHARGE MEDICATIONS: 1. Norvasc 10 mg p.o. q.d. 2. Aspirin 325 mg 1 p.o. q.d. 3. Prevacid 16 mg 1-2 q.d. p.r.n. abdominal pain. 4. Paxil 20 mg 1 q.d.  CONSULTANTS:  Jonathon [name], [name] of cardiology was consulted.  HISTORY OF PRESENT ILLNESS:  This is a 60 year old male who woke up with acute chest pain at 1 a.m.  The pain originates over the left chest, radiated over the entire chest, and was associated with shortness of breath, palpitations, and weakness.  There was no diaphoresis and no nausea.  Upon arrival to the emergency room, he had partial relief with nitroglycerin. Recent history of is significant only for mild nausea and loose stool after dinner the night before admission.  These symptoms were relieved with Tums.  Similar episode of chest pain in August of 2000 which resulted in a cardiac catheterization with results as described above.  His troponin I was elevated but a negative CK-MB.  Since that time, he gets brief chest pain once or twice a day not related to exertion.  SOCIAL HISTORY:  The patient is from [place].  He has  lived her for 10 years nd he works as a Conservation officer, nature at [employer].  He lives with his fiance.  He smokes three packs a day of cigarettes for many years.  He denies any alcohol or drugs.  FAMILY HISTORY:  Positive for hypertension but no diabetes and no myocardial infarction.  REVIEW OF SYSTEMS:  Significant for occasional heartburn, occasional headaches, and some shortness of breath with exertion.  He denies any GI symptoms such as melena or hematemesis.  PHYSICAL EXAMINATION:  VITAL SIGNS:  He has a temperature of 96.5, pulse 68, respirations 24, blood pressure 134/78.  He is saturating 92% on room air.  HEENT:  Clear with no icterus, erythema, or exudates.  NECK:  Supple with no JVD and no bruits.  LUNGS:  Clear to auscultation.  Increased expiratory phase.  CARDIOVASCULAR:  Regular rate and rhythm.  ABDOMEN:  Soft, nontender, and nondistended with normal active bowel sounds.  EXTREMITIES:  No clubbing, cyanosis, or edema with good pulses.  LABORATORY DATA:  Troponin I on admission is 8.82 with a CK of 162 and MB of 2.7. His sodium was 138, potassium of 3.7, chloride of 105, CO2  of 28, BUN 19, creatinine 1.2 with a sugar of 122, and calcium of 9.1.  He had a white cell count of 9.6, hemoglobin 16.3, platelets 203,000 with normal differential.  EKG shows normal sinus rhythm with a rate of 66, not significantly changed from  previous.  Chest x-ray shows no acute disease.  Mild hyperinflation.  HOSPITAL COURSE:  #1 - CHEST PAIN:  As mentioned, the patient has nonobstructive coronary artery disease.  At this point, we thought about other causes of chest pain.  The differential included pulmonary embolus as well as anxiety and depression at the top.  We obtained a spiral CT of the chest which was much delayed and then was n inadequate study.  Something went wrong with the injector in radiology.  Upon talking to the patient, he was obviously depressed with much  anxiety.  In fact, we were confidant enough that his pain was noncardiac and not from PE that we sent him home before he was able to have the CT repeated.  Because the CKs were negative, the patient did not have cardiac event; however,  elevated troponin I alone is certainly unusual.  Cardiology was consulted and recommended follow up as an outpatient to see if he has chronically increased troponin.  Otherwise, before discharge a homocystine level was ordered which was somehow not done and has not been in the computer as ordered.  We did an HIV test which can cause isolated elevated troponin and this was negative.  Also LDH with isoenzymes were also likewise ordered but somehow ot entered into the computer.  Troponins that followed showed a level of 3.67, 7.92, and then 3.68.  This shows no kind of trend whatsoever and was not related to the patients chest pain.  TSH was 0.649 which is within normal range.  Therefore, we believe the patient has isolated elevated troponins of an unknown cause.  Otherwise, for the patients chest pain, we have advised him to quit smoking. Follow-up EKGs showed no really significant change other than that the patient as mildly bradycardic.  We will follow this up as an outpatient.  #2 - DEPRESSION:  The patient was started on Paxil 20 mg a day which will be titrated up as an outpatient as tolerated.  His main life-time situation such as a son who is doing poorly at school and getting in trouble and a high stress job oth of which are stressing him out.  Also advised the patient to avoid caffeine or other stimulants as it may increase his anxiety and cause palpitations.  #3 - HYPERTENSION:  I believe this need better control given the patients risk factor of having smoking.  I have increased his Norvasc 10 mg p.o. q.d.  We will follow it up as an outpatient.  #4 - TOBACCO ABUSE:  Again have counselled the patient to stop smoking.   We will see how he does as an outpatient.   DISCHARGE INSTRUCTIONS:  The patient is to follow up with [facility].  He is also provided with the number of the outpatient clinic and he will follow up with Jonathon [name]. [name] or Dr. Tawni [name] as desired.  The patient left the floor in stable condition. DD:  01/30/99 TD:  01/30/99 Job: [id number]

## 2011-02-17 ENCOUNTER — Emergency Department (HOSPITAL_COMMUNITY): Payer: Medicaid Other

## 2011-02-17 ENCOUNTER — Other Ambulatory Visit: Payer: Self-pay

## 2011-02-17 ENCOUNTER — Encounter (HOSPITAL_COMMUNITY): Payer: Self-pay | Admitting: Emergency Medicine

## 2011-02-17 ENCOUNTER — Inpatient Hospital Stay (HOSPITAL_COMMUNITY)
Admission: EM | Admit: 2011-02-17 | Discharge: 2011-02-22 | DRG: 064 | Disposition: A | Discharge status: Rehabilitation Facility | Payer: Medicaid Other | Attending: Neurology | Admitting: Neurology

## 2011-02-17 DIAGNOSIS — I10 Essential (primary) hypertension: Secondary | ICD-10-CM | POA: Diagnosis present

## 2011-02-17 DIAGNOSIS — J449 Chronic obstructive pulmonary disease, unspecified: Secondary | ICD-10-CM | POA: Diagnosis present

## 2011-02-17 DIAGNOSIS — F17200 Nicotine dependence, unspecified, uncomplicated: Secondary | ICD-10-CM | POA: Diagnosis present

## 2011-02-17 DIAGNOSIS — G8190 Hemiplegia, unspecified affecting unspecified side: Secondary | ICD-10-CM | POA: Diagnosis present

## 2011-02-17 DIAGNOSIS — E876 Hypokalemia: Secondary | ICD-10-CM | POA: Diagnosis present

## 2011-02-17 DIAGNOSIS — R471 Dysarthria and anarthria: Secondary | ICD-10-CM | POA: Diagnosis present

## 2011-02-17 DIAGNOSIS — G936 Cerebral edema: Secondary | ICD-10-CM | POA: Diagnosis present

## 2011-02-17 DIAGNOSIS — Z9119 Patient's noncompliance with other medical treatment and regimen: Secondary | ICD-10-CM

## 2011-02-17 DIAGNOSIS — I613 Nontraumatic intracerebral hemorrhage in brain stem: Secondary | ICD-10-CM

## 2011-02-17 DIAGNOSIS — D72829 Elevated white blood cell count, unspecified: Secondary | ICD-10-CM | POA: Diagnosis present

## 2011-02-17 DIAGNOSIS — J4489 Other specified chronic obstructive pulmonary disease: Secondary | ICD-10-CM | POA: Diagnosis present

## 2011-02-17 DIAGNOSIS — I619 Nontraumatic intracerebral hemorrhage, unspecified: Principal | ICD-10-CM | POA: Diagnosis present

## 2011-02-17 DIAGNOSIS — Z91199 Patient's noncompliance with other medical treatment and regimen due to unspecified reason: Secondary | ICD-10-CM

## 2011-02-17 HISTORY — PX: TRANSTHORACIC ECHOCARDIOGRAM: SHX275

## 2011-02-17 HISTORY — DX: Nicotine dependence, unspecified, uncomplicated: F17.200

## 2011-02-17 HISTORY — DX: Heart failure, unspecified: I50.9

## 2011-02-17 HISTORY — DX: Essential (primary) hypertension: I10

## 2011-02-17 LAB — COMPREHENSIVE METABOLIC PANEL
ALT: 14 U/L (ref 0–53)
AST: 15 U/L (ref 0–37)
Albumin: 3.6 g/dL (ref 3.5–5.2)
Alkaline Phosphatase: 88 U/L (ref 39–117)
BUN: 16 mg/dL (ref 6–23)
CO2: 22 mEq/L (ref 19–32)
Calcium: 9.1 mg/dL (ref 8.4–10.5)
Chloride: 101 mEq/L (ref 96–112)
Creatinine, Ser: 0.91 mg/dL (ref 0.50–1.35)
GFR calc Af Amer: >90 mL/min (ref >90)
GFR calc non Af Amer: 90 mL/min — ABNORMAL LOW (ref >90)
Glucose, Bld: 127 mg/dL — ABNORMAL HIGH (ref 70–99)
Potassium: 3 mEq/L — ABNORMAL LOW (ref 3.5–5.1)
Sodium: 137 mEq/L (ref 135–145)
Total Bilirubin: 0.4 mg/dL (ref 0.3–1.2)
Total Protein: 7.7 g/dL (ref 6.0–8.3)

## 2011-02-17 LAB — CK TOTAL AND CKMB (NOT AT ARMC)
CK, MB: 2.9 ng/mL (ref 0.3–4.0)
Relative Index: 2.2 (ref 0.0–2.5)
Total CK: 131 U/L (ref 7–232)

## 2011-02-17 LAB — MRSA PCR SCREENING: MRSA by PCR: NEGATIVE

## 2011-02-17 LAB — DIFFERENTIAL
Basophils Absolute: 0 10*3/uL (ref 0.0–0.1)
Basophils Relative: 0 % (ref 0–1)
Eosinophils Absolute: 0.1 10*3/uL (ref 0.0–0.7)
Eosinophils Relative: 1 % (ref 0–5)
Lymphocytes Relative: 47 % — ABNORMAL HIGH (ref 12–46)
Lymphs Abs: 5.8 10*3/uL — ABNORMAL HIGH (ref 0.7–4.0)
Monocytes Absolute: 1.1 10*3/uL — ABNORMAL HIGH (ref 0.1–1.0)
Monocytes Relative: 9 % (ref 3–12)
Neutro Abs: 5.2 10*3/uL (ref 1.7–7.7)
Neutrophils Relative %: 43 % (ref 43–77)

## 2011-02-17 LAB — POCT I-STAT, CHEM 8
BUN: 15 mg/dL (ref 6–23)
Calcium, Ion: 1.12 mmol/L (ref 1.12–1.32)
Chloride: 106 mEq/L (ref 96–112)
Creatinine, Ser: 1 mg/dL (ref 0.50–1.35)
Glucose, Bld: 127 mg/dL — ABNORMAL HIGH (ref 70–99)
HCT: 51 % (ref 39.0–52.0)
Hemoglobin: 17.3 g/dL — ABNORMAL HIGH (ref 13.0–17.0)
Potassium: 3 mEq/L — ABNORMAL LOW (ref 3.5–5.1)
Sodium: 141 mEq/L (ref 135–145)
TCO2: 22 mmol/L (ref 0–100)

## 2011-02-17 LAB — CBC
HCT: 46.3 % (ref 39.0–52.0)
Hemoglobin: 15.1 g/dL (ref 13.0–17.0)
MCH: 28.9 pg (ref 26.0–34.0)
MCHC: 32.6 g/dL (ref 30.0–36.0)
MCV: 88.7 fL (ref 78.0–100.0)
Platelets: 211 10*3/uL (ref 150–400)
RBC: 5.22 MIL/uL (ref 4.22–5.81)
RDW: 14.2 % (ref 11.5–15.5)
WBC: 12.2 10*3/uL — ABNORMAL HIGH (ref 4.0–10.5)

## 2011-02-17 LAB — RAPID URINE DRUG SCREEN, HOSP PERFORMED
Amphetamines: NOT DETECTED
Barbiturates: NOT DETECTED
Benzodiazepines: NOT DETECTED
Cocaine: NOT DETECTED
Opiates: NOT DETECTED
Tetrahydrocannabinol: NOT DETECTED

## 2011-02-17 LAB — APTT: aPTT: 29 seconds (ref 24–37)

## 2011-02-17 LAB — PROTIME-INR
INR: 1.01 (ref 0.00–1.49)
Prothrombin Time: 13.5 seconds (ref 11.6–15.2)

## 2011-02-17 LAB — TROPONIN I: Troponin I: <0.3 ng/mL (ref <0.30)

## 2011-02-17 LAB — GLUCOSE, CAPILLARY: Glucose-Capillary: 129 mg/dL — ABNORMAL HIGH (ref 70–99)

## 2011-02-17 MED ORDER — ACETAMINOPHEN 650 MG RE SUPP: 650.0000 mg | RECTAL | Status: DC | PRN

## 2011-02-17 MED ORDER — LABETALOL HCL 5 MG/ML IV SOLN
5.0000 mg | Freq: Once | INTRAVENOUS | Status: AC
  Administered 2011-02-17: 5 mg via INTRAVENOUS

## 2011-02-17 MED ORDER — ACETAMINOPHEN 325 MG PO TABS
650.0000 mg | ORAL_TABLET | ORAL | Status: DC | PRN
  Administered 2011-02-18 – 2011-02-19 (×3): 650 mg via ORAL
  Filled 2011-02-17 (×3): qty 2

## 2011-02-17 MED ORDER — PANTOPRAZOLE SODIUM 40 MG IV SOLR
40.0000 mg | Freq: Every day | INTRAVENOUS | Status: DC
  Administered 2011-02-17 – 2011-02-18 (×2): 40 mg via INTRAVENOUS
  Filled 2011-02-17 (×3): qty 40

## 2011-02-17 MED ORDER — SENNOSIDES-DOCUSATE SODIUM 8.6-50 MG PO TABS
1.0000 | ORAL_TABLET | Freq: Two times a day (BID) | ORAL | Status: DC
  Administered 2011-02-18 – 2011-02-22 (×9): 1 via ORAL
  Filled 2011-02-17 (×12): qty 1

## 2011-02-17 MED ORDER — LABETALOL HCL 5 MG/ML IV SOLN: 5.0000 mg | Freq: Once | INTRAVENOUS | Status: AC

## 2011-02-17 MED ORDER — NICARDIPINE HCL IN NACL 20-0.86 MG/200ML-% IV SOLN
5.0000 mg/h | INTRAVENOUS | Status: DC
  Administered 2011-02-17: 9 mg/h via INTRAVENOUS
  Administered 2011-02-17: 10 mg/h via INTRAVENOUS
  Administered 2011-02-17: 12.5 mg/h via INTRAVENOUS
  Administered 2011-02-17 (×2): 9 mg/h via INTRAVENOUS
  Administered 2011-02-17: 10 mg/h via INTRAVENOUS
  Administered 2011-02-18: 5 mg/h via INTRAVENOUS
  Administered 2011-02-18 (×2): 8 mg/h via INTRAVENOUS
  Filled 2011-02-17 (×11): qty 200

## 2011-02-17 MED ORDER — ONDANSETRON HCL 4 MG/2ML IJ SOLN
INTRAMUSCULAR | Status: AC
  Administered 2011-02-17: 4 mg
  Filled 2011-02-17: qty 2

## 2011-02-17 MED ORDER — ONDANSETRON HCL 4 MG/2ML IJ SOLN
4.0000 mg | Freq: Four times a day (QID) | INTRAMUSCULAR | Status: DC | PRN
  Administered 2011-02-18: 4 mg via INTRAVENOUS
  Filled 2011-02-17 (×2): qty 2

## 2011-02-17 MED ORDER — LABETALOL HCL 5 MG/ML IV SOLN
10.0000 mg | INTRAVENOUS | Status: DC | PRN
  Administered 2011-02-17: 5 mg via INTRAVENOUS

## 2011-02-17 MED ORDER — SODIUM CHLORIDE 0.9 % IV SOLN
INTRAVENOUS | Status: DC
  Administered 2011-02-18: 08:00:00 via INTRAVENOUS

## 2011-02-17 MED ORDER — LABETALOL HCL 5 MG/ML IV SOLN
10.0000 mg | INTRAVENOUS | Status: DC | PRN
  Administered 2011-02-18: 20 mg via INTRAVENOUS
  Administered 2011-02-20 – 2011-02-21 (×6): 10 mg via INTRAVENOUS
  Filled 2011-02-17 (×5): qty 4

## 2011-02-17 MED ORDER — NICARDIPINE HCL IN NACL 20-0.86 MG/200ML-% IV SOLN
5.0000 mg/h | Freq: Once | INTRAVENOUS | Status: AC
  Administered 2011-02-17: 5 mg/h via INTRAVENOUS
  Filled 2011-02-17: qty 200

## 2011-02-17 NOTE — ED Notes (Signed)
Pt reportedly noncompliant with HBP medications. Was in [place] parking lot sitting when pt suddenly had HA, developed N?V and right sided weakness. Numbness to right arm, leg and tongue with slurred speech. Negative facial droop. 20G LFA EMS, 4mg  zofran PTA by EMS

## 2011-02-17 NOTE — ED Provider Notes (Addendum)
History     CSN: [id number]  Arrival date & time 02/17/11  1104   None     Chief Complaint  Patient presents with  . Code Stroke    (Consider location/radiation/quality/duration/timing/severity/associated sxs/prior treatment) Patient is a 61 y.o. male presenting with Acute Neurological Problem. The history is provided by the patient.  Cerebrovascular Accident This is a new problem. The current episode started less than 1 hour ago. The problem occurs constantly. The problem has not changed since onset.Associated symptoms include headaches. Pertinent negatives include no chest pain, no abdominal pain and no shortness of breath. The symptoms are aggravated by nothing. The symptoms are relieved by nothing. He has tried nothing for the symptoms.  Pt had sudden onset around 1015 of weakness , headache and vomiting.  The weakness has been mostly on the left side.  Pt has history of HTN and has not been on his medications for a few months.  Past Medical History  Diagnosis Date  . CHF (congestive heart failure)   . COPD (chronic obstructive pulmonary disease)   . Hypertension   . Smoker     No past surgical history on file.  No family history on file.  History  Substance Use Topics  . Smoking status: Current Everyday Smoker    Types: Cigarettes  . Smokeless tobacco: Not on file  . Alcohol Use: Not on file   patient does have history of tobacco use    Review of Systems  Respiratory: Negative for shortness of breath.   Cardiovascular: Negative for chest pain.  Gastrointestinal: Positive for nausea and vomiting. Negative for abdominal pain.  Neurological: Positive for headaches.  All other systems reviewed and are negative.    Allergies  Review of patient's allergies indicates no known allergies.  Home Medications  No current outpatient prescriptions on file. patient stopped taking his blood pressure medications a few months ago  BP 162/90  Pulse 54  Temp(Src) 98.4 F  (36.9 C) (Oral)  Resp 17  Ht 6' (1.829 m)  Wt 199 lb 11.8 oz (90.6 kg)  BMI 27.09 kg/m2  SpO2 94%  Physical Exam  Nursing note and vitals reviewed. Constitutional: He appears well-developed and well-nourished. He appears distressed.  HENT:  Head: Normocephalic and atraumatic.  Right Ear: External ear normal.  Left Ear: External ear normal.  Eyes: Conjunctivae are normal. Right eye exhibits no discharge. Left eye exhibits no discharge. No scleral icterus.  Neck: Neck supple. No tracheal deviation present.  Cardiovascular: Normal rate, regular rhythm and intact distal pulses.   Pulmonary/Chest: Effort normal and breath sounds normal. No stridor. No respiratory distress. He has no wheezes. He has no rales.  Abdominal: Soft. Bowel sounds are normal. He exhibits no distension. There is no tenderness. There is no rebound and no guarding.  Musculoskeletal: He exhibits no edema and no tenderness.  Neurological: He is alert. He has normal strength. He is not disoriented. Cranial nerve deficit:  no gross defecits noted. He exhibits normal muscle tone. He displays no seizure activity.       Pt alert, able to move all extremities however weakness left leg, Please see stroke team assessment for full neuro exam  Skin: Skin is warm and dry. No rash noted.  Psychiatric: He has a normal mood and affect.    ED Course  Procedures (including critical care time)  Date: 02/17/2011  Rate: 67  Rhythm: normal sinus rhythm  QRS Axis: normal  Intervals: normal  ST/T Wave abnormalities: normal  Conduction  Disutrbances:none  Narrative Interpretation: Consider anteroseptal infarct, borderline T-wave changes lateral leads  Old EKG Reviewed: unchanged except for borderline lateral T wave changes   Labs Reviewed  CBC - Abnormal; Notable for the following:    WBC 12.2 (*)    All other components within normal limits  DIFFERENTIAL - Abnormal; Notable for the following:    Lymphocytes Relative 47 (*)     Lymphs Abs 5.8 (*)    Monocytes Absolute 1.1 (*)    All other components within normal limits  COMPREHENSIVE METABOLIC PANEL - Abnormal; Notable for the following:    Potassium 3.0 (*)    Glucose, Bld 127 (*)    GFR calc non Af Amer 90 (*)    All other components within normal limits  GLUCOSE, CAPILLARY - Abnormal; Notable for the following:    Glucose-Capillary 129 (*)    All other components within normal limits  POCT I-STAT, CHEM 8 - Abnormal; Notable for the following:    Potassium 3.0 (*)    Glucose, Bld 127 (*)    Hemoglobin 17.3 (*)    All other components within normal limits  HEMOGLOBIN A1C - Abnormal; Notable for the following:    Hemoglobin A1C 5.7 (*)    Mean Plasma Glucose 117 (*)    All other components within normal limits  PROTIME-INR  APTT  CK TOTAL AND CKMB  TROPONIN I  URINE RAPID DRUG SCREEN (HOSP PERFORMED)  MRSA PCR SCREENING  LIPID PANEL  BASIC METABOLIC PANEL  CARDIAC PANEL(CRET KIN+CKTOT+MB+TROPI)  CARDIAC PANEL(CRET KIN+CKTOT+MB+TROPI)  CARDIAC PANEL(CRET KIN+CKTOT+MB+TROPI)   Ct Head Wo Contrast  02/17/2011  *RADIOLOGY REPORT*  Clinical Data: 61 year old male Code stroke. Vomiting, right side weakness.  CT HEAD WITHOUT CONTRAST  Technique:  Contiguous axial images were obtained from the base of the skull through the vertex without contrast.  Comparison: None.  Findings: Hyperdense intra-axial hemorrhage in the left dorsal pons with estimated volume of 1 ml tracks cephalad toward the left cerebral peduncle.  Questionable early extension into the left lateral aspect of the fourth ventricle - versus asymmetric coronoid calcification within that ventricle (this has Hounsfield units of less than 100).  No associated posterior fossa mass effect.  Basilar cisterns remain patent.  No other acute intracranial hemorrhage.  No supratentorial mass lesion or mass effect.  No ventriculomegaly.  Scattered supratentorial hypodense area is suggestive of chronic small vessel  disease, including I have focus in the right deep gray matter nuclei. No evidence of cortically based acute infarction identified.  Coarse pineal calcification. No suspicious intracranial vascular hyperdensity.  No acute osseous abnormality identified.  Small fluid level in the right maxillary sinus.  Other Visualized paranasal sinuses and mastoids are clear.  Visualized orbits and scalp soft tissues are within normal limits.  IMPRESSION: 1.  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  No mass effect or significant surrounding edema at this time. 2.  Questionable early extension into the left fourth ventricle. Alternatively, this could reflect asymmetric coronoid calcification. 3.  No other acute intracranial hemorrhage.  Chronic small vessel ischemia suspected.  Critical Value/emergent results were shared on 02/17/2011 at the time of imaging  with  neurologist Dr.Bezov, who was present in the imaging suite.  Original Report Authenticated By: Harley Hallmark, [name]     1. Pontine hemorrhage       MDM  Pt seen emergently upon arrival by the stroke team, Dr. Lyman Speller.  Pt will be admitted to the neuro icu.  At this time stable, protecting his airway.  HTN control has been initiated.  I have consulted Dr Danielle Dess who will see the patient in the ED.        Celene Kras, MD 02/17/11 1122  Celene Kras, MD 02/17/11 1124  Celene Kras, MD 02/19/11 [phone]

## 2011-02-17 NOTE — Consult Note (Signed)
Admission H&P    Chief Complaint: "right hemiparesis and nausea" HPI: Jonathon [name] is an 61 y.o. male who has been noncompliant with his medications and had sudden onset right sided hemiparesis with dysarthria and confusion and vomiting. NIHSS of 4. t-PA not given due to left pontine hemorrhage.   LSN: 10:15 am tPA Given: No: Hemorrhagic CVA mRankin: 2  Past Medical History  Diagnosis Date  . CHF (congestive heart failure)   . COPD (chronic obstructive pulmonary disease)   . Hypertension    No past surgical history on file.  No family history on file. Social History:  does not have a smoking history on file. He does not have any smokeless tobacco history on file. His alcohol and drug histories not on file.  Allergies: Allergies not on file  Medications Prior to Admission  Medication Dose Route Frequency Provider Last Rate Last Dose  . labetalol (NORMODYNE,TRANDATE) injection 5 mg  5 mg Intravenous Once Carmell Austria, MD   5 mg at 02/17/11 1112  . labetalol (NORMODYNE,TRANDATE) injection 5 mg  5 mg Intravenous Once Carmell Austria, MD      . niCARdipine (CARDENE-IV) infusion (0.1 mg/ml)  5 mg/hr Intravenous Once Carmell Austria, MD      . ondansetron [facility]) 4 MG/2ML injection        4 mg at 02/17/11 1050  . DISCONTD: labetalol (NORMODYNE,TRANDATE) injection 10 mg  10 mg Intravenous Q10 min PRN Carmell Austria, MD   5 mg at 02/17/11 1118   No current outpatient prescriptions on file as of 02/17/2011.   ROS: As above  Physical Examination: Blood pressure 161/143, pulse 71, temperature 97.5 F (36.4 C), resp. rate 25, height 6' (1.829 m), weight 95.255 kg (210 lb), SpO2 95.00%.  Neurologic Examination: MS: AAO*3, no aphasia, followed complex commands CN: EOMI, PERRL, VFF, no facial asymmetry, tongue midline, V2-V3 sensation reduced on right, mildly dysarthric Motor: no drift, 5/5 right arm drift, distal > proximal right arm and leg weakness (4+/5 - 3+/5), left side was 5/5. Sensory:  no deficit to pain in arms and legs Coord: F to N intact b/l Reflexes: 1+ throughout, mute plantars b/l Gait: deferred  Results for orders placed during the hospital encounter of 02/17/11 (from the past 48 hour(s))  POCT I-STAT, CHEM 8     Status: Abnormal   Collection Time   02/17/11 11:01 AM      Component Value Range Comment   Sodium 141  135 - 145 (mEq/L)    Potassium 3.0 (*) 3.5 - 5.1 (mEq/L)    Chloride 106  96 - 112 (mEq/L)    BUN 15  6 - 23 (mg/dL)    Creatinine, Ser 4.09  0.50 - 1.35 (mg/dL)    Glucose, Bld 811 (*) 70 - 99 (mg/dL)    Calcium, Ion 9.14  1.12 - 1.32 (mmol/L)    TCO2 22  0 - 100 (mmol/L)    Hemoglobin 17.3 (*) 13.0 - 17.0 (g/dL)    HCT 78.2  95.6 - 21.3 (%)   PROTIME-INR     Status: Normal   Collection Time   02/17/11 11:05 AM      Component Value Range Comment   Prothrombin Time 13.5  11.6 - 15.2 (seconds)    INR 1.01  0.00 - 1.49    APTT     Status: Normal   Collection Time   02/17/11 11:05 AM      Component Value Range Comment   aPTT 29  24 -  37 (seconds)   CBC     Status: Abnormal   Collection Time   02/17/11 11:05 AM      Component Value Range Comment   WBC 12.2 (*) 4.0 - 10.5 (K/uL)    RBC 5.22  4.22 - 5.81 (MIL/uL)    Hemoglobin 15.1  13.0 - 17.0 (g/dL)    HCT 65.7  84.6 - 96.2 (%)    MCV 88.7  78.0 - 100.0 (fL)    MCH 28.9  26.0 - 34.0 (pg)    MCHC 32.6  30.0 - 36.0 (g/dL)    RDW 95.2  84.1 - 32.4 (%)    Platelets 211  150 - 400 (K/uL)   DIFFERENTIAL     Status: Normal (Preliminary result)   Collection Time   02/17/11 11:05 AM      Component Value Range Comment   Neutrophils Relative PENDING  43 - 77 (%)    Neutro Abs PENDING  1.7 - 7.7 (K/uL)    Band Neutrophils PENDING  0 - 10 (%)    Lymphocytes Relative PENDING  12 - 46 (%)    Lymphs Abs PENDING  0.7 - 4.0 (K/uL)    Monocytes Relative PENDING  3 - 12 (%)    Monocytes Absolute PENDING  0.1 - 1.0 (K/uL)    Eosinophils Relative PENDING  0 - 5 (%)    Eosinophils Absolute PENDING  0.0 -  0.7 (K/uL)    Basophils Relative PENDING  0 - 1 (%)    Basophils Absolute PENDING  0.0 - 0.1 (K/uL)    WBC Morphology PENDING      RBC Morphology PENDING      Smear Review PENDING      nRBC PENDING  0 (/100 WBC)    Metamyelocytes Relative PENDING      Myelocytes PENDING      Promyelocytes Absolute PENDING      Blasts PENDING     GLUCOSE, CAPILLARY     Status: Abnormal   Collection Time   02/17/11 11:22 AM      Component Value Range Comment   Glucose-Capillary 129 (*) 70 - 99 (mg/dL)    Ct Head Wo Contrast  02/17/2011  *RADIOLOGY REPORT*  Clinical Data: 61 year old male Code stroke. Vomiting, right side weakness.  CT HEAD WITHOUT CONTRAST  Technique:  Contiguous axial images were obtained from the base of the skull through the vertex without contrast.  Comparison: None.  Findings: Hyperdense intra-axial hemorrhage in the left dorsal pons with estimated volume of 1 ml tracks cephalad toward the left cerebral peduncle.  Questionable early extension into the left lateral aspect of the fourth ventricle - versus asymmetric coronoid calcification within that ventricle (this has Hounsfield units of less than 100).  No associated posterior fossa mass effect.  Basilar cisterns remain patent.  No other acute intracranial hemorrhage.  No supratentorial mass lesion or mass effect.  No ventriculomegaly.  Scattered supratentorial hypodense area is suggestive of chronic small vessel disease, including I have focus in the right deep gray matter nuclei. No evidence of cortically based acute infarction identified.  Coarse pineal calcification. No suspicious intracranial vascular hyperdensity.  No acute osseous abnormality identified.  Small fluid level in the right maxillary sinus.  Other Visualized paranasal sinuses and mastoids are clear.  Visualized orbits and scalp soft tissues are within normal limits.  IMPRESSION: 1.  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  No mass effect or significant  surrounding edema at this time. 2.  Questionable early extension into the left fourth ventricle. Alternatively, this could reflect asymmetric coronoid calcification. 3.  No other acute intracranial hemorrhage.  Chronic small vessel ischemia suspected.  Critical Value/emergent results were shared on 02/17/2011 at the time of imaging  with  neurologist Dr.Hilton Saephan, who was present in the imaging suite.  Original Report Authenticated By: Harley Hallmark, [name]   Assessment: 61 y.o. Male who has hypertension and is non-compliant with him medications who comes in with sudden onset right hemiparesis, slurred speech and vomiting   Stroke Risk Factors - hypertension  Plan: 1. HgbA1c, fasting lipid panel 2. MRI, MRA  of the brain without contrast 3. PT consult, OT consult, Speech consult 4. Echocardiogram 5. Hold ASA and anticoagulants 6. Risk factor modification including smoking 7. Nicardipine drip at 5 mg/hr, can go up by 2.5 mg every 5 min. as needed for SBP below 140 to 160 8. Nursing Swallow Eval, if passes, heart healthy diet 9. Frequent Neurochecks 10. ICU  LOS: 0 days   Leonetta Mcgivern

## 2011-02-17 NOTE — Progress Notes (Signed)
  Echocardiogram 2D Echocardiogram has been performed.  Jonathon [name] [facility] 02/17/2011, 2:36 PM

## 2011-02-17 NOTE — Code Documentation (Signed)
61 yo male out shopping with wife when at 78 he had sudden onset R tongue/face numbness,  R side weakness, headache and vomiting.  EMS was called to [employer] parking lot and activated code stroke at 1034. Stroke team arrived at 1042, pt arrived at 78 and was met by EDP at that time. Pt was actively vomiting and treated with Zofran, then cleared for CT arriving there at 1052.  CT showed brain stem hemorrhage, read by neurologist at 1059. Pt was taken to room 2. BP was 181/98. Total of labetalol 10 mg given, with HR down to 59-60, but BP still elevated. Cardene drip requested. Considered for ATTACH II trial, but location of hemorrhage is and excluder. NIHSS=4 (see documentation).Neurosurgery consult requested.

## 2011-02-17 NOTE — Evaluation (Signed)
Clinical/Bedside Swallow Evaluation Patient Details  Name: Jonathon [name] MRN: [id number] DOB: [birth year] Today's Date: 02/17/2011  Past Medical History:  Past Medical History  Diagnosis Date  . CHF (congestive heart failure)   . COPD (chronic obstructive pulmonary disease)   . Hypertension    Past Surgical History: No past surgical history on file. HPI:  61 yr old admitted with acute nausea, right hemiparesis.  CT showed a pontine hemorrhage.   Assessment/Recommendations/Treatment Plan   SLP Assessment Clinical Impression Statement: Pt. mildly lethargic, however, able to maintain alertness during assessment.  Mild oral deficits with min labial residue.  Pharyngeal phase characterized by slower hyo-laryngeal elevation, however, ROM of laryngeal elevation appeared adequate.  No direct indications of airway compromise, however, pontine CVA increases pt.'s risk for silent aspiration.  Objective assesement warrented prior to recommneding diet/liquids.  MBS can be performed tomorrow morning.  Keep NPO except crucial meds by mouth if any (crush or whole in applesauce). Risk for Aspiration: Moderate  Swallow Evaluation Recommendations Recommended Consults: MBS General Recommendation: NPO except meds Oral Care Recommendations: Oral care BID  Treatment Plan Treatment Plan Recommendations:  (TX PLAN TO BE DEVELOPED AFTER MBS)    Individuals Consulted Consulted and Agree with Results and Recommendations: Patient;Family member/caregiver  Swallow Study General  Date of Onset: 02/17/11 HPI: 61 yr old admitted with acute nausea, right hemiparesis.  CT showed a pontine hemorrhage. Type of Study: Bedside swallow evaluation Diet Prior to this Study: NPO Respiratory Status: Supplemental O2 delivered via (comment) Behavior/Cognition: Cooperative;Lethargic Oral Cavity - Dentition: Adequate natural dentition Patient Positioning: Upright in bed Baseline Vocal Quality: Wet Volitional Cough:  Wet (slightly wet, baseline quality is very deep premorbid (fam)) Volitional Swallow: Able to elicit  Oral Motor/Sensory Function  Overall Oral Motor/Sensory Function: Impaired Labial ROM: Reduced right Labial Symmetry: Abnormal symmetry right Labial Strength: Reduced Lingual ROM:  (dec precision) Mandible: Within Functional Limits  Consistency Results  Ice Chips Ice chips: Within functional limits Presentation: Spoon  Thin Liquid Thin Liquid: Impaired Presentation: Cup Pharyngeal  Phase Impairments:  (slow, slightly sluggish)  Nectar Thick Liquid Nectar Thick Liquid: Not tested  Honey Thick Liquid Honey Thick Liquid: Not tested  Puree Puree: Impaired Presentation: Self Fed Oral Phase Impairments:  (? decreased sensation) Oral Phase Functional Implications:  (labial residue)  Solid Solid: Not tested   Royce Macadamia M.Ed [employer] [phone]  02/17/2011

## 2011-02-17 NOTE — ED Notes (Signed)
O2 sat down to 90-91%. O2 started at 2 l/[place].

## 2011-02-17 NOTE — ED Notes (Signed)
Jonathon Bering, MD shown Chem 8 lab test results

## 2011-02-17 NOTE — H&P (Signed)
Admission H&P    Chief Complaint: "right hemiparesis and nausea" HPI: Jonathon [name] is an 61 y.o. male who has been noncompliant with his medications and had sudden onset right sided hemiparesis with dysarthria and confusion and vomiting. NIHSS of 4. t-PA not given due to left pontine hemorrhage.   LSN: 10:15 am tPA Given: No: Hemorrhagic CVA mRankin: 2  Past Medical History  Diagnosis Date  . CHF (congestive heart failure)   . COPD (chronic obstructive pulmonary disease)   . Hypertension    No past surgical history on file.  No family history on file. Social History:  does not have a smoking history on file. He does not have any smokeless tobacco history on file. His alcohol and drug histories not on file.  Allergies: Allergies not on file  Medications Prior to Admission  Medication Dose Route Frequency Provider Last Rate Last Dose  . labetalol (NORMODYNE,TRANDATE) injection 5 mg  5 mg Intravenous Once Ronda Rajkumar, MD   5 mg at 02/17/11 1112  . labetalol (NORMODYNE,TRANDATE) injection 5 mg  5 mg Intravenous Once Mairyn Lenahan, MD      . niCARdipine (CARDENE-IV) infusion (0.1 mg/ml)  5 mg/hr Intravenous Once Gabriela Irigoyen, MD      . ondansetron (ZOFRAN) 4 MG/2ML injection        4 mg at 02/17/11 1050  . DISCONTD: labetalol (NORMODYNE,TRANDATE) injection 10 mg  10 mg Intravenous Q10 min PRN Ayleen Mckinstry, MD   5 mg at 02/17/11 1118   No current outpatient prescriptions on file as of 02/17/2011.   ROS: As above  Physical Examination: Blood pressure 161/143, pulse 71, temperature 97.5 F (36.4 C), resp. rate 25, height 6' (1.829 m), weight 95.255 kg (210 lb), SpO2 95.00%.  Neurologic Examination: MS: AAO*3, no aphasia, followed complex commands CN: EOMI, PERRL, VFF, no facial asymmetry, tongue midline, V2-V3 sensation reduced on right, mildly dysarthric Motor: no drift, 5/5 right arm drift, distal > proximal right arm and leg weakness (4+/5 - 3+/5), left side was 5/5. Sensory:  no deficit to pain in arms and legs Coord: F to N intact b/l Reflexes: 1+ throughout, mute plantars b/l Gait: deferred  Results for orders placed during the hospital encounter of 02/17/11 (from the past 48 hour(s))  POCT I-STAT, CHEM 8     Status: Abnormal   Collection Time   02/17/11 11:01 AM      Component Value Range Comment   Sodium 141  135 - 145 (mEq/L)    Potassium 3.0 (*) 3.5 - 5.1 (mEq/L)    Chloride 106  96 - 112 (mEq/L)    BUN 15  6 - 23 (mg/dL)    Creatinine, Ser 1.00  0.50 - 1.35 (mg/dL)    Glucose, Bld 127 (*) 70 - 99 (mg/dL)    Calcium, Ion 1.12  1.12 - 1.32 (mmol/L)    TCO2 22  0 - 100 (mmol/L)    Hemoglobin 17.3 (*) 13.0 - 17.0 (g/dL)    HCT 51.0  39.0 - 52.0 (%)   PROTIME-INR     Status: Normal   Collection Time   02/17/11 11:05 AM      Component Value Range Comment   Prothrombin Time 13.5  11.6 - 15.2 (seconds)    INR 1.01  0.00 - 1.49    APTT     Status: Normal   Collection Time   02/17/11 11:05 AM      Component Value Range Comment   aPTT 29  24 -   37 (seconds)   CBC     Status: Abnormal   Collection Time   02/17/11 11:05 AM      Component Value Range Comment   WBC 12.2 (*) 4.0 - 10.5 (K/uL)    RBC 5.22  4.22 - 5.81 (MIL/uL)    Hemoglobin 15.1  13.0 - 17.0 (g/dL)    HCT 46.3  39.0 - 52.0 (%)    MCV 88.7  78.0 - 100.0 (fL)    MCH 28.9  26.0 - 34.0 (pg)    MCHC 32.6  30.0 - 36.0 (g/dL)    RDW 14.2  11.5 - 15.5 (%)    Platelets 211  150 - 400 (K/uL)   DIFFERENTIAL     Status: Normal (Preliminary result)   Collection Time   02/17/11 11:05 AM      Component Value Range Comment   Neutrophils Relative PENDING  43 - 77 (%)    Neutro Abs PENDING  1.7 - 7.7 (K/uL)    Band Neutrophils PENDING  0 - 10 (%)    Lymphocytes Relative PENDING  12 - 46 (%)    Lymphs Abs PENDING  0.7 - 4.0 (K/uL)    Monocytes Relative PENDING  3 - 12 (%)    Monocytes Absolute PENDING  0.1 - 1.0 (K/uL)    Eosinophils Relative PENDING  0 - 5 (%)    Eosinophils Absolute PENDING  0.0 -  0.7 (K/uL)    Basophils Relative PENDING  0 - 1 (%)    Basophils Absolute PENDING  0.0 - 0.1 (K/uL)    WBC Morphology PENDING      RBC Morphology PENDING      Smear Review PENDING      nRBC PENDING  0 (/100 WBC)    Metamyelocytes Relative PENDING      Myelocytes PENDING      Promyelocytes Absolute PENDING      Blasts PENDING     GLUCOSE, CAPILLARY     Status: Abnormal   Collection Time   02/17/11 11:22 AM      Component Value Range Comment   Glucose-Capillary 129 (*) 70 - 99 (mg/dL)    Ct Head Wo Contrast  02/17/2011  *RADIOLOGY REPORT*  Clinical Data: 50-year-old male Code stroke. Vomiting, right side weakness.  CT HEAD WITHOUT CONTRAST  Technique:  Contiguous axial images were obtained from the base of the skull through the vertex without contrast.  Comparison: None.  Findings: Hyperdense intra-axial hemorrhage in the left dorsal pons with estimated volume of 1 ml tracks cephalad toward the left cerebral peduncle.  Questionable early extension into the left lateral aspect of the fourth ventricle - versus asymmetric coronoid calcification within that ventricle (this has Hounsfield units of less than 100).  No associated posterior fossa mass effect.  Basilar cisterns remain patent.  No other acute intracranial hemorrhage.  No supratentorial mass lesion or mass effect.  No ventriculomegaly.  Scattered supratentorial hypodense area is suggestive of chronic small vessel disease, including I have focus in the right deep gray matter nuclei. No evidence of cortically based acute infarction identified.  Coarse pineal calcification. No suspicious intracranial vascular hyperdensity.  No acute osseous abnormality identified.  Small fluid level in the right maxillary sinus.  Other Visualized paranasal sinuses and mastoids are clear.  Visualized orbits and scalp soft tissues are within normal limits.  IMPRESSION: 1.  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  No mass effect or significant  surrounding edema at this time. 2.    Questionable early extension into the left fourth ventricle. Alternatively, this could reflect asymmetric coronoid calcification. 3.  No other acute intracranial hemorrhage.  Chronic small vessel ischemia suspected.  Critical Value/emergent results were shared on 02/17/2011 at the time of imaging  with  neurologist Jonathon [name], who was present in the imaging suite.  Original Report Authenticated By: Jonathon [name], [name]   Assessment: 60 y.o. Male who has hypertension and is non-compliant with him medications who comes in with sudden onset right hemiparesis, slurred speech and vomiting   Stroke Risk Factors - hypertension  Plan: 1. HgbA1c, fasting lipid panel 2. MRI, MRA  of the brain without contrast 3. PT consult, OT consult, Speech consult 4. Echocardiogram 5. Hold ASA and anticoagulants 6. Risk factor modification including smoking 7. Nicardipine drip at 5 mg/hr, can go up by 2.5 mg every 5 min. as needed for SBP below 140 to 160 8. Nursing Swallow Eval, if passes, heart healthy diet 9. Frequent Neurochecks 10. ICU  LOS: 0 days   Dorien Mayotte   

## 2011-02-17 NOTE — ED Notes (Signed)
Neuro sx at bedside to examine pt.

## 2011-02-17 NOTE — Consult Note (Signed)
Reason for Consult to: Pontine hemorrhage Referring Physician: Dr. Elnora Morrison is an 61 y.o. male.  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 The CT scan also demonstrates that the patient had an old left external capsule lacunar these hadn't right thalamic stroke in the past also these apparently were silent as the patient's family and the patient did not admit to ever having had a stroke before.  Currently the patient is arousable to voice follows commands has weakness on the right side his speech is slurred. He is being admitted to the stroke service. I have been asked to consult secondary to the presence of blood in the patient's pons and the possibility of acute hydrocephalus.  Past Medical History  Diagnosis Date  . CHF (congestive heart failure)   . COPD (chronic obstructive pulmonary disease)   . Hypertension     No past surgical history on file.  No family history on file.  Social History:  does not have a smoking history on file. He does not have any smokeless tobacco history on file. His alcohol and drug histories not on file.  Allergies: Allergies not on file  Medications: I have reviewed the patient's current medications.  Results for orders placed during the hospital encounter of 02/17/11 (from the past 48 hour(s))  POCT I-STAT, CHEM 8     Status: Abnormal   Collection Time   02/17/11 11:01 AM      Component Value Range Comment   Sodium 141  135 - 145 (mEq/L)    Potassium 3.0 (*) 3.5 - 5.1 (mEq/L)    Chloride 106  96 - 112 (mEq/L)    BUN 15  6 - 23 (mg/dL)    Creatinine, Ser 9.14  0.50 - 1.35 (mg/dL)    Glucose, Bld 782 (*) 70 - 99 (mg/dL)    Calcium, Ion 9.56  1.12 - 1.32 (mmol/L)    TCO2 22  0 - 100 (mmol/L)    Hemoglobin 17.3 (*) 13.0 - 17.0 (g/dL)    HCT 21.3  08.6 - 57.8 (%)   PROTIME-INR     Status: Normal   Collection Time   02/17/11 11:05 AM      Component Value Range Comment   Prothrombin Time 13.5  11.6 - 15.2 (seconds)    INR 1.01  0.00 - 1.49    APTT     Status: Normal   Collection Time   02/17/11 11:05 AM      Component Value Range Comment   aPTT 29  24 - 37 (seconds)   CBC     Status: Abnormal   Collection Time   02/17/11 11:05 AM      Component Value Range Comment   WBC 12.2 (*) 4.0 - 10.5 (K/uL)    RBC 5.22  4.22 - 5.81 (MIL/uL)    Hemoglobin 15.1  13.0 - 17.0 (g/dL)    HCT 46.9  62.9 - 52.8 (%)    MCV 88.7  78.0 - 100.0 (fL)    MCH 28.9  26.0 - 34.0 (pg)    MCHC 32.6  30.0 - 36.0 (g/dL)    RDW 41.3  24.4 - 01.0 (%)    Platelets 211  150 - 400 (K/uL)  DIFFERENTIAL     Status: Abnormal   Collection Time   02/17/11 11:05 AM      Component Value Range Comment   Neutrophils Relative 43  43 - 77 (%)    Lymphocytes Relative 47 (*) 12 - 46 (%)    Monocytes Relative 9  3 - 12 (%)    Eosinophils Relative 1  0 - 5 (%)    Basophils Relative 0  0 - 1 (%)    Neutro Abs 5.2  1.7 - 7.7 (K/uL)    Lymphs Abs 5.8 (*) 0.7 - 4.0 (K/uL)    Monocytes Absolute 1.1 (*) 0.1 - 1.0 (K/uL)    Eosinophils Absolute 0.1  0.0 - 0.7 (K/uL)    Basophils Absolute 0.0  0.0 - 0.1 (K/uL)   COMPREHENSIVE METABOLIC PANEL     Status: Abnormal   Collection Time   02/17/11 11:05 AM      Component Value Range Comment   Sodium 137  135 - 145 (mEq/L)    Potassium 3.0 (*) 3.5 - 5.1 (mEq/L)    Chloride 101  96 - 112 (mEq/L)    CO2 22  19 - 32 (mEq/L)    Glucose, Bld 127 (*) 70 - 99 (mg/dL)    BUN 16  6 - 23 (mg/dL)    Creatinine, Ser 1.61  0.50 - 1.35 (mg/dL)    Calcium 9.1  8.4 - 10.5 (mg/dL)    Total Protein 7.7  6.0 - 8.3 (g/dL)    Albumin 3.6  3.5 - 5.2 (g/dL)    AST 15  0 - 37 (U/L)    ALT 14  0 - 53 (U/L)    Alkaline Phosphatase 88  39 - 117 (U/L)    Total Bilirubin  0.4  0.3 - 1.2 (mg/dL)    GFR calc non Af Amer 90 (*) >90 (mL/min)    GFR calc Af Amer >90  >90 (mL/min)   CK TOTAL AND CKMB     Status: Normal   Collection Time   02/17/11 11:05 AM      Component Value Range Comment   Total CK 131  7 - 232 (U/L)    CK, MB 2.9  0.3 - 4.0 (ng/mL)    Relative Index 2.2  0.0 - 2.5    TROPONIN I     Status: Normal   Collection Time   02/17/11 11:05 AM      Component Value Range Comment   Troponin I <0.30  <0.30 (ng/mL)   GLUCOSE, CAPILLARY     Status: Abnormal   Collection Time   02/17/11 11:22 AM      Component Value Range Comment   Glucose-Capillary 129 (*) 70 - 99 (mg/dL)     Ct Head Wo Contrast  02/17/2011  *RADIOLOGY REPORT*  Clinical Data: 61 year old male Code stroke. Vomiting, right side weakness.  CT HEAD WITHOUT CONTRAST  Technique:  Contiguous axial images were obtained from the base of the skull through the vertex without contrast.  Comparison: None.  Findings: Hyperdense intra-axial hemorrhage in the left dorsal pons with estimated volume of 1 ml tracks cephalad toward the left cerebral peduncle.  Questionable early extension into the left lateral aspect of the fourth ventricle - versus asymmetric coronoid calcification within that ventricle (this has Hounsfield units of less than 100).  No associated posterior fossa mass effect.  Basilar cisterns remain patent.  No other acute intracranial hemorrhage.  No supratentorial mass lesion or mass effect.  No ventriculomegaly.  Scattered  supratentorial hypodense area is suggestive of chronic small vessel disease, including I have focus in the right deep gray matter nuclei. No evidence of cortically based acute infarction identified.  Coarse pineal calcification. No suspicious intracranial vascular hyperdensity.  No acute osseous abnormality identified.  Small fluid level in the right maxillary sinus.  Other Visualized paranasal sinuses and mastoids are clear.  Visualized orbits and scalp soft tissues are within  normal limits.  IMPRESSION: 1.  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  No mass effect or significant surrounding edema at this time. 2.  Questionable early extension into the left fourth ventricle. Alternatively, this could reflect asymmetric coronoid calcification. 3.  No other acute intracranial hemorrhage.  Chronic small vessel ischemia suspected.  Critical Value/emergent results were shared on 02/17/2011 at the time of imaging  with  neurologist Dr.Bezov, who was present in the imaging suite.  Original Report Authenticated By: Harley Hallmark, [name]   Dg Chest Portable 1 View  02/17/2011  *RADIOLOGY REPORT*  Clinical Data: Vomiting and weakness.  Hemorrhagic brainstem stroke.  PORTABLE CHEST - 1 VIEW  Comparison: 09/23/2009 and CT of 09/24/2009.  Findings: Biapical emphysema.  Mild cardiomegaly. No pleural effusion or pneumothorax.  Numerous leads and wires project over the chest.  Lower lobe predominant interstitial thickening persists.  Possible superimposed left lower lobe airspace disease. This is at the site of probable scarring on the prior exam.  IMPRESSION:  1.  Cannot exclude early left lower lobe airspace disease. Infection or aspiration.  If PA and lateral radiographs are possible, they should be considered.  Alternately, short-term radiographic follow-up recommended. 2.  Bolus emphysema with lower lobe predominant interstitial thickening.  Original Report Authenticated By: Consuello Bossier, [name]    Review of Systems  Constitutional: Negative.   HENT: Negative.   Eyes: Positive for double vision.  Respiratory: Negative.   Cardiovascular: Negative.   Gastrointestinal: Negative.   Genitourinary: Negative.   Musculoskeletal: Negative.   Skin: Negative.   Neurological: Positive for dizziness, sensory change, speech change and focal weakness.  Endo/Heme/Allergies: Negative.   Psychiatric/Behavioral: Negative.    Blood pressure 165/92, pulse 65, temperature 97.5 F (36.4  C), resp. rate 25, height 6' (1.829 m), weight 95.255 kg (210 lb), SpO2 94.00%. Physical Exam patient arouses to voice and will follow commands more briskly with the left side than he does with the right side is evidence of right upper and right lower extremity weakness he has significant dysmetria using the right upper extremity and right lower extremity. The pupils are 2 mm and reactive there is end gaze nystagmus when the patient attempts to look right. The patient's tongue protrudes to the right side. His face does appear symmetric. Sensation is diminished on the right side of the patient's face compared to the left side and all 3 distributions. The patient demonstrates significant dysmetria on the right upper extremity.  Assessment/Plan: Small pontine hemorrhage on the left side of the pons measuring 1.6  centimeters. We will observe the patient for the near term if no clinical worsening occurs we will sign off when appropriate.  Jonathon [name] J 02/17/2011, 11:55 AM

## 2011-02-17 NOTE — ED Notes (Signed)
BP cont 170/94, cardene increased to 7.5 mg.

## 2011-02-17 NOTE — Progress Notes (Signed)
Patient c/o worsening numbness going from just the right side of his face evolving to his entire face.  Patient is still AOx4 and still at neuro baseline in upper and lower extremities.  Dr. Lyman Speller notified of this change.  No new orders given at this time will cont to monitor patient.

## 2011-02-18 ENCOUNTER — Inpatient Hospital Stay (HOSPITAL_COMMUNITY): Payer: Medicaid Other

## 2011-02-18 LAB — LIPID PANEL
Cholesterol: 133 mg/dL (ref 0–200)
HDL: 50 mg/dL (ref >39)
LDL Cholesterol: 72 mg/dL (ref 0–99)
Total CHOL/HDL Ratio: 2.7 RATIO
Triglycerides: 56 mg/dL (ref <150)
VLDL: 11 mg/dL (ref 0–40)

## 2011-02-18 LAB — HEMOGLOBIN A1C
Hgb A1c MFr Bld: 5.7 % — ABNORMAL HIGH (ref <5.7)
Mean Plasma Glucose: 117 mg/dL — ABNORMAL HIGH (ref <117)

## 2011-02-18 MED ORDER — HYDROCHLOROTHIAZIDE 25 MG PO TABS
25.0000 mg | ORAL_TABLET | Freq: Every day | ORAL | Status: DC
  Administered 2011-02-18 – 2011-02-22 (×5): 25 mg via ORAL
  Filled 2011-02-18 (×5): qty 1

## 2011-02-18 MED ORDER — NICARDIPINE HCL IN NACL 20-0.86 MG/200ML-% IV SOLN
5.0000 mg/h | INTRAVENOUS | Status: DC
  Administered 2011-02-18: 3 mg/h via INTRAVENOUS
  Filled 2011-02-18 (×3): qty 200

## 2011-02-18 MED ORDER — LISINOPRIL 5 MG PO TABS
5.0000 mg | ORAL_TABLET | Freq: Every day | ORAL | Status: DC
  Administered 2011-02-18 – 2011-02-21 (×4): 5 mg via ORAL
  Filled 2011-02-18 (×4): qty 1

## 2011-02-18 MED ORDER — POTASSIUM CHLORIDE 10 MEQ/100ML IV SOLN
10.0000 meq | INTRAVENOUS | Status: DC
  Filled 2011-02-18: qty 100

## 2011-02-18 MED ORDER — POTASSIUM CHLORIDE CRYS ER 20 MEQ PO TBCR
40.0000 meq | EXTENDED_RELEASE_TABLET | Freq: Once | ORAL | Status: AC
  Administered 2011-02-18: 40 meq via ORAL
  Filled 2011-02-18: qty 2

## 2011-02-18 NOTE — Procedures (Signed)
Modified Barium Swallow Procedure Note Patient Details  Name: Jonathon [name] MRN: [id number] Date of Birth: 1950/01/24  Today's Date: 02/18/2011 Time:  -    Past Medical History:  Past Medical History  Diagnosis Date  . CHF (congestive heart failure)   . COPD (chronic obstructive pulmonary disease)   . Hypertension   . Smoker    Past Surgical History: No past surgical history on file. HPI:  61 yr old with acute pontine CVA    Recommendation/Prognosis  Clinical Impression Dysphagia Diagnosis: Within Functional Limits Clinical impression: Pt. demonstrated functional oropharyngeal swallow ability with consistencies assessed.  One instance of brief delay with initiation of straw sip of thin liquid.  No difficulty managing oral transit or pharyngeal swallow of barium pill and thin liquids.  Recommed regular diet and thin liquids.    Swallow Evaluation Recommendations Solid Consistency: Regular Liquid Consistency: Thin  Liquid Administration via: Straw;Cup Medication Administration: Whole meds with liquid Supervision: Intermittent supervision to cue for compensatory strategies Compensations: Slow rate;Small sips/bites;Check for pocketing Postural Changes and/or Swallow Maneuvers: Seated upright 90 degrees Oral Care Recommendations: Oral care BID Recommendations for Other Services: Rehab consult Follow up Recommendations: None Prognosis Prognosis for Safe Diet Advancement: Good Individuals Consulted Consulted and Agree with Results and Recommendations: Family member/caregiver;Patient  SLP Assessment/Plan   SLP Goals  SLP Swallowing Goals Patient will consume recommended diet without observed clinical signs of aspiration with: Modified independent assistance Patient will utilize recommended strategies during swallow to increase swallowing safety with: Modified independent assistance  General:  Date of Onset: 02/17/11 HPI: 61 yr old with acute pontine CVA Type of Study:  Initial MBS Diet Prior to this Study: NPO Respiratory Status: Supplemental O2 delivered via (comment) Behavior/Cognition: Alert;Cooperative;Pleasant mood Oral Cavity - Dentition: Adequate natural dentition Oral Motor / Sensory Function:  (See BSE for OME results) Patient Positioning: Upright in chair Baseline Vocal Quality: Clear Volitional Cough: Wet (slightly wet, baseline quality is very deep premorbid (fam)) Volitional Swallow: Able to elicit Anatomy: Within functional limits Pharyngeal Secretions: Not observed secondary MBS  Oral Phase Oral Preparation/Oral Phase Oral Phase: WFL Pharyngeal Phase  Pharyngeal Phase Pharyngeal Phase: Impaired Pharyngeal Phase - Comment Pharyngeal Comment:  (DELAYED SWALLOW TO PYRIFORM X 1 WITH THIN & CRACKER) Cervical Esophageal Phase  Cervical Esophageal Phase Cervical Esophageal Phase: [facility]   Darrow Bussing.Ed [employer] [phone]  02/18/2011

## 2011-02-18 NOTE — Evaluation (Signed)
Physical Therapy Evaluation Patient Details Name: Jonathon [name] MRN: [id number] DOB: 1950/07/13 Today's Date: 02/18/2011  Problem List: There is no problem list on file for this patient.   Past Medical History:  Past Medical History  Diagnosis Date  . CHF (congestive heart failure)   . COPD (chronic obstructive pulmonary disease)   . Hypertension   . Smoker    Past Surgical History: No past surgical history on file.  PT Assessment/Plan/Recommendation PT Assessment Clinical Impression Statement: Jonathon [name] is an 61 y.o. male who has been noncompliant with his medications and had sudden onset right sided hemiparesis with dysarthria and confusion and vomiting 02/17/2011. NIHSS of 4. t-PA not given due to left pontine hemorrhage. he was admitted to the neuro ICU for further evaluation and treatment. PT eval now complete 02/18/11. Pt moving all extremities well with weakness noted on right (grossly 4/5 UE/LE) affecting general mobility, balance and gait. Will benefit physical therapy in the acute setting to maximize mobility so as to increase indenpendence and eventual d/c home. Rec CIR for continued therapies. If pt makes good progress he may be able to d/c home with outpatient physical therapy. Encouraged pt to mobilize with nursing and to continue using RUE/RLE as pt under the impression that he couldn't use them. PT Recommendation/Assessment: Patient will need skilled PT in the acute care venue PT Problem List: Decreased strength;Decreased activity tolerance;Decreased balance;Decreased mobility;Decreased coordination;Decreased knowledge of use of DME;Impaired sensation Barriers to Discharge: Inaccessible home environment Barriers to Discharge Comments: bed room on the second floor PT Therapy Diagnosis : Difficulty walking;Abnormality of gait;Generalized weakness PT Plan PT Frequency: Min 4X/week PT Treatment/Interventions: Gait training;Stair training;Functional mobility  training;Neuromuscular re-education;Balance training;Therapeutic exercise;Therapeutic activities;DME instruction;Patient/family education PT Recommendation Follow Up Recommendations: Inpatient Rehab Equipment Recommended: Defer to next venue PT Goals  Acute Rehab PT Goals PT Goal Formulation: With patient Time For Goal Achievement: 7 days Pt will go Supine/Side to Sit: with modified independence PT Goal: Supine/Side to Sit - Progress: Goal set today Pt will go Sit to Supine/Side: with modified independence PT Goal: Sit to Supine/Side - Progress: Goal set today Pt will go Sit to Stand: with modified independence PT Goal: Sit to Stand - Progress: Goal set today Pt will go Stand to Sit: with modified independence PT Goal: Stand to Sit - Progress: Goal set today Pt will Transfer Bed to Chair/Chair to Bed: with modified independence PT Transfer Goal: Bed to Chair/Chair to Bed - Progress: Goal set today Pt will Stand: with modified independence PT Goal: Stand - Progress: Goal set today Pt will Ambulate: >150 feet;with modified independence;with least restrictive assistive device PT Goal: Ambulate - Progress: Goal set today Pt will Go Up / Down Stairs: Flight;with min assist;with least restrictive assistive device PT Goal: Up/Down Stairs - Progress: Goal set today Additional Goals Additional Goal #1: Pt will demonstrate decreased risk of falls wtih Sharlene Motts balance score greater than or equal to 45/56 PT Goal: Additional Goal #1 - Progress: Goal set today  PT Evaluation Precautions/Restrictions  Precautions Precautions: Fall Prior Functioning  Home Living Lives With: Spouse Receives Help From: Family Type of Home: House Home Layout: Two level Alternate Level Stairs-Rails: Right Alternate Level Stairs-Number of Steps: one flight Home Access: Stairs to enter Entrance Stairs-Rails: Right Entrance Stairs-Number of Steps: 5 Home Adaptive Equipment: None Prior Function Level of  Independence: Independent with basic ADLs;Independent with homemaking with ambulation;Independent with gait;Independent with transfers Driving: Yes Vocation: Full time employment Cognition Cognition Arousal/Alertness: Awake/alert Overall Cognitive Status:  Appears within functional limits for tasks assessed Orientation Level: Oriented X4 Cognition - Other Comments: slightly self limiting at first with decreased insight into his condition; kept saying he couldn't move his RLE/RUE althought they were obviously moving in the bed; anxious Sensation/Coordination Sensation Light Touch: Impaired by gross assessment (RUE/RLE "numb") Stereognosis: Not tested Hot/Cold: Not tested Proprioception: Not tested Coordination Gross Motor Movements are Fluid and Coordinated: No Fine Motor Movements are Fluid and Coordinated: No Coordination and Movement Description: decreased on right (especially during gait) pt demonstrating slightly ataxic quality type steps Extremity Assessment RUE Assessment RUE Assessment:  (grossly 4/5; full active shoulder flex with encouragement) LUE Assessment LUE Assessment: Within Functional Limits RLE Assessment RLE Assessment:  (grossly 4/5) LLE Assessment LLE Assessment: Within Functional Limits Mobility (including Balance) Bed Mobility Bed Mobility: Yes Supine to Sit: 4: Min assist;HOB elevated (Comment degrees) (30 degrees) Supine to Sit Details (indicate cue type and reason): increased effort on pt's part but only needing minimal tactile cues for follow through and sequencing; pt limiting the use of his RUE and needing cues to actually use it during the transfer Sitting - Scoot to Edge of Bed: 5: Supervision Sitting - Scoot to Edge of Bed Details (indicate cue type and reason): verbal cues to use his RUE to assist Transfers Transfers: Yes Sit to Stand: 1: +2 Total assist;From bed;With upper extremity assist (75%) Sit to Stand Details (indicate cue type and reason):  pt initially reaching out for therapist and rehab tech to pull him up; with verbal cues to position hands on the bed and minimal facilitation for anterior translation in prep to stand pt able to stand with +2totalpt75% to steady him and assist with follow through; pt very fearful Stand to Sit: With armrests;With upper extremity assist;To chair/3-in-1;4: Min assist Stand to Sit Details: cues for encouragement and hand placement on chair; when pt forced to stand unsupported in prep to sit he lost his footing slightly and teetered to the left requiring assist to steady himself; cues for hands on arm rests and assist to guide him to chair slowly Ambulation/Gait Ambulation/Gait: Yes Ambulation/Gait Assistance: 1: +2 Total assist Ambulation/Gait Assistance Details (indicate cue type and reason): pt amb approx 6 ft with bilateral HHA and facilitation around waist to give pt confidence and assist with weight shift and reciprocal steps; pt with slight ataxic step on right with decreased stance time on right throwinig him off balance during gait; pt very fearful (RR increased to 30s but he does well with calming cues for deep breathing)  Ambulation Distance (Feet): 6 Feet Assistive device: 2 person hand held assist Gait Pattern: Ataxic;Trunk flexed;Decreased stance time - right;Decreased step length - right Stairs: No Wheelchair Mobility Wheelchair Mobility: No  Balance Balance Assessed: Yes Static Standing Balance Static Standing - Level of Assistance: 1: +2 Total assist (80%) Static Standing - Comment/# of Minutes: pt needing increased encouragement to look forward and extend through trunk (pt c/o dizziness throughout session that improved with focus on single object in front of him); when he was able to do this he stood very tall but still relied on bilateral HHA to steady himself; without upper extremity assist pt lost balance to left requring minA to correct Dynamic Standing Balance Dynamic Standing -  Balance Support: Bilateral upper extremity supported Dynamic Standing - Level of Assistance: 1: +2 Total assist (75%) Dynamic Standing - Balance Activities: Lateral lean/weight shifting Dynamic Standing - Comments: pt needing manual faciliation at hips for good technique with weight shift  as he is fearful to shift to right but able with no knee buckling Exercise  Total Joint Exercises Ankle Circles/Pumps: AROM;Both;10 reps;Supine End of Session PT - End of Session Equipment Utilized During Treatment: Gait belt Activity Tolerance: Patient tolerated treatment well;Patient limited by fatigue (anxiety; pt also with complaints of dizziness throughout session) Patient left: in chair;with call bell in reach;with family/visitor present Nurse Communication: Mobility status for transfers;Mobility status for ambulation General Behavior During Session: [facility] for tasks performed (anxious and slightly self limiting) Cognition: WFL for tasks performed  [facility] HELEN 02/18/2011, 3:49 PM

## 2011-02-18 NOTE — Progress Notes (Signed)
*  PRELIMINARY RESULTS* Vascular Ultrasound Carotid Duplex (Doppler) has been completed.  Preliminary findings: Bilaterally no significant ICA stenosis with antegrade vertebral flow.     Farrel Demark RDMS 02/18/2011, 11:55 AM

## 2011-02-18 NOTE — Plan of Care (Signed)
Problem: Phase II Progression Outcomes Goal: Tolerating diet/TF at goal rate MBS performed.  Recommend regular texture diet and thin liquids.  Breck Coons Worthington.Ed [employer] [phone]  02/18/2011

## 2011-02-18 NOTE — Progress Notes (Addendum)
Stroke Team Progress Note  HISTORY Jonathon [name] is an 61 y.o. male who has been noncompliant with his medications and had sudden onset right sided hemiparesis with dysarthria and confusion and vomiting 02/17/2011. NIHSS of 4. t-PA not given due to left pontine hemorrhage. he was admitted to the neuro ICU for further evaluation and treatment.He was considered for the ATACH-2 study but brainstem hemorrhage was an exclusion.  SUBJECTIVE His wife is at the bedside. Overall he feels his condition is stable. He stopped taking BP medication because it made him feel tired and not good. States he has always had high BP but it has never been a problem. Cannot remember name of antihypertensive med he has been on in the past.he has remained stable overnight with blood pressure tightly control on the Cardene drip. He has noticed improvement in his right-sided strength but still has persistent numbness.  OBJECTIVE Filed Vitals:   02/18/11 0637 02/18/11 0700 02/18/11 0715 02/18/11 0730  BP: 136/64 124/60 139/70 146/72  Pulse:  85 75 74  Temp:      TempSrc:      Resp: 17 22 18 17   Height:      Weight:      SpO2:  98% 99% 99%   CBG (last 3)  Basename 02/17/11 1122  GLUCAP 129*   Intake/Output from previous day: 01/31 0701 - 02/01 0700 In: 1646.2 [I.V.:1646.2] Out: 1970 [Urine:1970]  IV Fluid Intake:     . sodium chloride 75 mL/hr at 02/18/11 0800  . niCARDipine Stopped (02/18/11 0715)   Medications    . labetalol  5 mg Intravenous Once  . labetalol  5 mg Intravenous Once  . niCARDipine  5 mg/hr Intravenous Once  . ondansetron      . pantoprazole (PROTONIX) IV  40 mg Intravenous QHS  . senna-docusate  1 tablet Oral BID  PRN acetaminophen, acetaminophen, labetalol, ondansetron (ZOFRAN) IV, DISCONTD: labetalol  Diet:  NPO  Activity:  Bedrest   DVT Prophylaxis:  SCDs   Significant Diagnostic Studies: CBC    Component Value Date/Time   WBC 12.2* 02/17/2011 1105   RBC 5.22 02/17/2011  1105   HGB 15.1 02/17/2011 1105   HCT 46.3 02/17/2011 1105   PLT 211 02/17/2011 1105   MCV 88.7 02/17/2011 1105   MCH 28.9 02/17/2011 1105   MCHC 32.6 02/17/2011 1105   RDW 14.2 02/17/2011 1105   LYMPHSABS 5.8* 02/17/2011 1105   MONOABS 1.1* 02/17/2011 1105   EOSABS 0.1 02/17/2011 1105   BASOSABS 0.0 02/17/2011 1105   CMP    Component Value Date/Time   NA 137 02/17/2011 1105   K 3.0* 02/17/2011 1105   CL 101 02/17/2011 1105   CO2 22 02/17/2011 1105   GLUCOSE 127* 02/17/2011 1105   BUN 16 02/17/2011 1105   CREATININE 0.91 02/17/2011 1105   CALCIUM 9.1 02/17/2011 1105   PROT 7.7 02/17/2011 1105   ALBUMIN 3.6 02/17/2011 1105   AST 15 02/17/2011 1105   ALT 14 02/17/2011 1105   ALKPHOS 88 02/17/2011 1105   BILITOT 0.4 02/17/2011 1105   GFRNONAA 90* 02/17/2011 1105   GFRAA >90 02/17/2011 1105   COAGS Lab Results  Component Value Date   INR 1.01 02/17/2011   INR 1.10 09/24/2009   INR 0.99 09/23/2009   Lipid Panel    Component Value Date/Time   CHOL 133 02/18/2011 0335   TRIG 56 02/18/2011 0335   HDL 50 02/18/2011 0335   CHOLHDL 2.7 02/18/2011 0335   VLDL  11 02/18/2011 0335   LDLCALC 72 02/18/2011 0335   HgbA1C  Lab Results  Component Value Date   HGBA1C  Value: 5.8 (NOTE)                                                                       According to the ADA Clinical Practice Recommendations for 2011, when HbA1c is used as a screening test:   >=6.5%   Diagnostic of Diabetes Mellitus           (if abnormal result  is confirmed)  5.7-6.4%   Increased risk of developing Diabetes Mellitus  References:Diagnosis and Classification of Diabetes Mellitus,Diabetes Care,2011,34(Suppl 1):S62-S69 and Standards of Medical Care in         Diabetes - 2011,Diabetes Care,2011,34  (Suppl 1):S11-S61.* 09/25/2009   Urine Drug Screen    Component Value Date/Time   LABOPIA NONE DETECTED 02/17/2011 1918   COCAINSCRNUR NONE DETECTED 02/17/2011 1918   LABBENZ NONE DETECTED 02/17/2011 1918   AMPHETMU NONE DETECTED 02/17/2011 1918    THCU NONE DETECTED 02/17/2011 1918   LABBARB NONE DETECTED 02/17/2011 1918    Alcohol Level No results found for this basename: eth  Cardiac enzymes neg MRSA neg   CT of the brain   02/17/2011   1.  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  No mass effect or significant surrounding edema at this time. 2.  Questionable early extension into the left fourth ventricle. Alternatively, this could reflect asymmetric coronoid calcification. 3.  No other acute intracranial hemorrhage.  Chronic small vessel ischemia suspected.    MRI of the brain    MRA of the brain    2D Echocardiogram    Carotid Doppler    CXR   1.  Cannot exclude early left lower lobe airspace disease. Infection or aspiration.  If PA and lateral radiographs are possible, they should be considered.  Alternately, short-term radiographic follow-up recommended. 2.  Bolus emphysema with lower lobe predominant interstitial thickening.   EKG  normal sinus rhythm.   Physical Exam    Pleasant and awake and is currently not in distress. Afebrile. Head is nontraumatic. Neck is supple without bruit. Earring it is normal. Cardiac exam no murmur or gallop. Lungs clear to auscultation.  Neurological exam awake alert oriented x3 with normal speech and language function. Mild dysarthria. No aphasia. No facial weakness. Extraocular movements are full range but has some end gaze nystagmus and subjective diplopia. Tongue is midline. Motor system exam reveals no upper or lower extremitydrift. Fine finger movements are diminished on the right. Orbits left-to-right approximately. He has diminished touch pinprick sensation in the right hemibody. Coordination is slow but accurate. Plantars are downgoing.  ASSESSMENT [name] is a 61 y.o. male with a pontine hemorrhage with vasogenic edemia, secondary to malignant hypertension. On IV cardene for BP control.  -hypokalemia, 3.0 -leukocytosis, 12.2 -noncompliance with  medications  Hospital day # 1  TREATMENT/PLAN -Repeat imaging to evaluate stroke.continue Cardene with strict control of blood pressure. Start oral blood pressure medications after he passes swallow screen -OOB. Therapy evals. -MBSS today. Diet per ST. -Add antihypertensive once able to swallow -Replace K -Check 2D, Carotid doppler. F/u HgbA1c -Discuss with patient and wife and  answered questions.This patient is critically ill and at significant risk of neurological worsening, death and care requires constant monitoring of vital signs, hemodynamics,respiratory and cardiac monitoring, neurological assessment, discussion with family, other specialists and medical decision making of high complexity. I spent 30 minutes of neurocritical care time  in the care of  this patient.   Joaquin Music, ANP-BC, GNP-BC [facility] Pager: [phone] 02/18/2011 8:29 AM  Dr. Delia Heady, Stroke Center Medical Director, has personally reviewed chart, pertinent data, examined the patient and developed the plan of care.

## 2011-02-19 ENCOUNTER — Other Ambulatory Visit: Payer: Self-pay

## 2011-02-19 LAB — BASIC METABOLIC PANEL
BUN: 9 mg/dL (ref 6–23)
CO2: 22 mEq/L (ref 19–32)
Calcium: 9.4 mg/dL (ref 8.4–10.5)
Chloride: 102 mEq/L (ref 96–112)
Creatinine, Ser: 0.83 mg/dL (ref 0.50–1.35)
GFR calc Af Amer: >90 mL/min (ref >90)
GFR calc non Af Amer: >90 mL/min (ref >90)
Glucose, Bld: 115 mg/dL — ABNORMAL HIGH (ref 70–99)
Potassium: 3.3 mEq/L — ABNORMAL LOW (ref 3.5–5.1)
Sodium: 138 mEq/L (ref 135–145)

## 2011-02-19 LAB — CARDIAC PANEL(CRET KIN+CKTOT+MB+TROPI)
CK, MB: 3.2 ng/mL (ref 0.3–4.0)
CK, MB: 2.5 ng/mL (ref 0.3–4.0)
Relative Index: 2.4 (ref 0.0–2.5)
Relative Index: 2.2 (ref 0.0–2.5)
Total CK: 132 U/L (ref 7–232)
Total CK: 112 U/L (ref 7–232)
Troponin I: <0.3 ng/mL (ref <0.30)
Troponin I: <0.3 ng/mL (ref <0.30)

## 2011-02-19 MED ORDER — HYDROCODONE-ACETAMINOPHEN 5-325 MG PO TABS
1.0000 | ORAL_TABLET | ORAL | Status: DC | PRN
  Administered 2011-02-19 – 2011-02-20 (×3): 1 via ORAL
  Filled 2011-02-19 (×2): qty 1

## 2011-02-19 MED ORDER — GABAPENTIN 300 MG PO CAPS
300.0000 mg | ORAL_CAPSULE | Freq: Three times a day (TID) | ORAL | Status: DC
  Administered 2011-02-19 (×3): 300 mg via ORAL
  Filled 2011-02-19 (×6): qty 1

## 2011-02-19 MED ORDER — PANTOPRAZOLE SODIUM 40 MG PO TBEC
40.0000 mg | DELAYED_RELEASE_TABLET | Freq: Every day | ORAL | Status: DC
  Administered 2011-02-19 – 2011-02-22 (×4): 40 mg via ORAL
  Filled 2011-02-19 (×4): qty 1

## 2011-02-19 NOTE — Progress Notes (Signed)
Stroke Team Progress Note  HISTORY Jonathon [name] is an 61 y.o. male who has been noncompliant with his medications and had sudden onset right sided hemiparesis with dysarthria and confusion and vomiting 02/17/2011. NIHSS of 4. t-PA not given due to left pontine hemorrhage. he was admitted to the neuro ICU for further evaluation and treatment.He was considered for the ATACH-2 study but brainstem hemorrhage was an exclusion.  SUBJECTIVE His wife is at the bedside. Overall he feels his condition is stable. The patient reports onset of burning sensations involve the right arm in particular, but also the right leg. The patient feels numbness on the entire right side.  The patient indicates that he is eating and drinking well. Patient has been up out of bed on one occasion.  OBJECTIVE Filed Vitals:   02/19/11 0400 02/19/11 0500 02/19/11 0600 02/19/11 0700  BP: 150/77 143/78 143/90 162/90  Pulse: 66 57 61 54  Temp:      TempSrc:      Resp: 18 17 20 17   Height:      Weight:      SpO2:  94% 94%    CBG (last 3)   Basename 02/17/11 1122  GLUCAP 129*   Intake/Output from previous day: 02/01 0701 - 02/02 0700 In: 752.4 [P.O.:245; I.V.:507.4] Out: 3000 [Urine:3000]  IV Fluid Intake:      . sodium chloride Stopped (02/18/11 0900)  . niCARDipine Stopped (02/19/11 0200)  . DISCONTD: niCARDipine 3 mg/hr (02/18/11 1535)   Medications     . hydrochlorothiazide  25 mg Oral Daily  . lisinopril  5 mg Oral Daily  . pantoprazole (PROTONIX) IV  40 mg Intravenous QHS  . potassium chloride  40 mEq Oral Once  . senna-docusate  1 tablet Oral BID  . DISCONTD: potassium chloride  10 mEq Intravenous Q1 Hr x 4  PRN acetaminophen, acetaminophen, labetalol, ondansetron (ZOFRAN) IV  Diet:  Cardiac  Activity:  Bedrest   DVT Prophylaxis:  SCDs   Significant Diagnostic Studies: CBC    Component Value Date/Time   WBC 12.2* 02/17/2011 1105   RBC 5.22 02/17/2011 1105   HGB 15.1 02/17/2011 1105   HCT 46.3 02/17/2011 1105   PLT 211 02/17/2011 1105   MCV 88.7 02/17/2011 1105   MCH 28.9 02/17/2011 1105   MCHC 32.6 02/17/2011 1105   RDW 14.2 02/17/2011 1105   LYMPHSABS 5.8* 02/17/2011 1105   MONOABS 1.1* 02/17/2011 1105   EOSABS 0.1 02/17/2011 1105   BASOSABS 0.0 02/17/2011 1105   CMP    Component Value Date/Time   NA 137 02/17/2011 1105   K 3.0* 02/17/2011 1105   CL 101 02/17/2011 1105   CO2 22 02/17/2011 1105   GLUCOSE 127* 02/17/2011 1105   BUN 16 02/17/2011 1105   CREATININE 0.91 02/17/2011 1105   CALCIUM 9.1 02/17/2011 1105   PROT 7.7 02/17/2011 1105   ALBUMIN 3.6 02/17/2011 1105   AST 15 02/17/2011 1105   ALT 14 02/17/2011 1105   ALKPHOS 88 02/17/2011 1105   BILITOT 0.4 02/17/2011 1105   GFRNONAA 90* 02/17/2011 1105   GFRAA >90 02/17/2011 1105   COAGS Lab Results  Component Value Date   INR 1.01 02/17/2011   INR 1.10 09/24/2009   INR 0.99 09/23/2009   Lipid Panel    Component Value Date/Time   CHOL 133 02/18/2011 0335   TRIG 56 02/18/2011 0335   HDL 50 02/18/2011 0335   CHOLHDL 2.7 02/18/2011 0335   VLDL 11 02/18/2011 0335  LDLCALC 72 02/18/2011 0335   HgbA1C  Lab Results  Component Value Date   HGBA1C 5.7* 02/18/2011   Urine Drug Screen    Component Value Date/Time   LABOPIA NONE DETECTED 02/17/2011 1918   COCAINSCRNUR NONE DETECTED 02/17/2011 1918   LABBENZ NONE DETECTED 02/17/2011 1918   AMPHETMU NONE DETECTED 02/17/2011 1918   THCU NONE DETECTED 02/17/2011 1918   LABBARB NONE DETECTED 02/17/2011 1918    Alcohol Level No results found for this basename: eth  Cardiac enzymes neg MRSA neg   CT of the brain   02/17/2011   1.  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  No mass effect or significant surrounding edema at this time. 2.  Questionable early extension into the left fourth ventricle. Alternatively, this could reflect asymmetric coronoid calcification. 3.  No other acute intracranial hemorrhage.  Chronic small vessel ischemia suspected.    MRI of the brain    IMPRESSION:  Left paracentral posterior pontine hemorrhage extends into the  inferior aspect of the left mid brain. Surrounding vasogenic  edema. Mild deformity of the superior margin of the fourth  ventricle. This may represent a hypertensive hemorrhage. Please  see above.    MRA of the brain   IMPRESSION:  Intracranial atherosclerotic type changes as noted above.    2D Echocardiogram    Study Conclusions  - Left ventricle: The cavity size was normal. Wall thickness was increased in a pattern of moderate LVH. Systolic function was normal. The estimated ejection fraction was in the range of 55% to 60%. Wall motion was normal; there were no regional wall motion abnormalities. Doppler parameters are consistent with abnormal left ventricular relaxation (grade 1 diastolic dysfunction). - Left atrium: The atrium was mildly dilated. Impressions:  - No cardiac source of emboli was indentified.   Carotid Doppler    Vascular Ultrasound  Carotid Duplex (Doppler) has been completed. Preliminary findings: Bilaterally no significant ICA stenosis with antegrade vertebral flow.     CXR   1.  Cannot exclude early left lower lobe airspace disease. Infection or aspiration.  If PA and lateral radiographs are possible, they should be considered.  Alternately, short-term radiographic follow-up recommended. 2.  Bolus emphysema with lower lobe predominant interstitial thickening.   EKG  normal sinus rhythm.   Physical Exam    The patient is alert and cooperative at the time of the examination. The speech is somewhat hoarse, not aphasic.  Respiratory examination is clear.  Cardiovascular examination reveals a regular rate and rhythm, no obvious murmurs or rubs noted.  Abdomen reveals good bowel sounds, nontender.  The patient has mild weakness with grip of the right arm, and dysmetria with finger-nose-finger with the right arm and with toe to finger with the right leg.  No ataxia was  seen on the left side of body.  No drift is seen with the upper extremities.  The patient has symmetric reflexes, toes are downgoing bilaterally.  The patient was not ambulated.  Visual fields are full. Slight decrease in the right nasolabial fold is noted.   ASSESSMENT [name] is a 61 y.o. male with a pontine hemorrhage with vasogenic edemia, secondary to malignant hypertension. On IV cardene for BP control.  -hypokalemia, 3.0 -leukocytosis, 12.2 -noncompliance with medications  Neuropathic pain on the right body  EKG changes, normal troponin I and CK-MB levels.  Hospital day # 2  TREATMENT/PLAN  The patient appears to be stable at this point. We'll plan on a  recheck CT scan of the brain tomorrow, and possible transfer to the floor if the patient remains stable. The patient will be up in a chair today.  The patient reports neuropathic pain developing on the right side of the body. The patient will be started on gabapentin today.  Patient will be mobilized. Rehabilitation consult will be obtained.  The patient will be continued on blood pressure medications, and blood pressures will be monitored.   Dianah Field [facility] Pager: [phone] 02/19/2011 9:00 AM

## 2011-02-19 NOTE — Progress Notes (Signed)
Subjective: Patient reports Patient reports feeling somewhat better, however has significant pain in right shoulder and right upper extremity. Level of consciousness is much improved. Starting to mobilize very slowly.  Objective: Vital signs in last 24 hours: Temp:  [98.1 F (36.7 C)-98.4 F (36.9 C)] 98.4 F (36.9 C) (02/01 1957) Pulse Rate:  [54-125] 64  (02/02 1000) Resp:  [14-23] 21  (02/02 1000) BP: (119-187)/(73-116) 141/85 mmHg (02/02 1000) SpO2:  [92 %-99 %] 99 % (02/02 1000)  Intake/Output from previous day: 02/01 0701 - 02/02 0700 In: 752.4 [P.O.:245; I.V.:507.4] Out: 3000 [Urine:3000] Intake/Output this shift: Total I/O In: -  Out: 375 [Urine:375]  Slight right upper and right lower extremity weakness noted. Gaze appears conjugate now.  Lab Results:  Basename 02/17/11 1105 02/17/11 1101  WBC 12.2* --  HGB 15.1 17.3*  HCT 46.3 51.0  PLT 211 --   BMET  Basename 02/19/11 0611 02/17/11 1105  NA 138 137  K 3.3* 3.0*  CL 102 101  CO2 22 22  GLUCOSE 115* 127*  BUN 9 16  CREATININE 0.83 0.91  CALCIUM 9.4 9.1    Studies/Results: Ct Head Wo Contrast  02/17/2011  *RADIOLOGY REPORT*  Clinical Data: 61 year old male Code stroke. Vomiting, right side weakness.  CT HEAD WITHOUT CONTRAST  Technique:  Contiguous axial images were obtained from the base of the skull through the vertex without contrast.  Comparison: None.  Findings: Hyperdense intra-axial hemorrhage in the left dorsal pons with estimated volume of 1 ml tracks cephalad toward the left cerebral peduncle.  Questionable early extension into the left lateral aspect of the fourth ventricle - versus asymmetric coronoid calcification within that ventricle (this has Hounsfield units of less than 100).  No associated posterior fossa mass effect.  Basilar cisterns remain patent.  No other acute intracranial hemorrhage.  No supratentorial mass lesion or mass effect.  No ventriculomegaly.  Scattered supratentorial  hypodense area is suggestive of chronic small vessel disease, including I have focus in the right deep gray matter nuclei. No evidence of cortically based acute infarction identified.  Coarse pineal calcification. No suspicious intracranial vascular hyperdensity.  No acute osseous abnormality identified.  Small fluid level in the right maxillary sinus.  Other Visualized paranasal sinuses and mastoids are clear.  Visualized orbits and scalp soft tissues are within normal limits.  IMPRESSION: 1.  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  No mass effect or significant surrounding edema at this time. 2.  Questionable early extension into the left fourth ventricle. Alternatively, this could reflect asymmetric coronoid calcification. 3.  No other acute intracranial hemorrhage.  Chronic small vessel ischemia suspected.  Critical Value/emergent results were shared on 02/17/2011 at the time of imaging  with  neurologist Dr.Bezov, who was present in the imaging suite.  Original Report Authenticated By: Harley Hallmark, [name]   Mr Angiogram Head Wo Contrast  02/18/2011  *RADIOLOGY REPORT*  Clinical Data:  Right-sided hemiparesis and nausea.  Evaluate pontine bleed. Hypertensive patient.  MRI HEAD WITHOUT CONTRAST MRA HEAD WITHOUT CONTRAST  Technique: Multiplanar, multiecho pulse sequences of the brain and surrounding structures were obtained according to standard protocol without intravenous contrast.  Angiographic images of the head were obtained using MRA technique without contrast.  Comparison: 02/17/2011 CT.  No comparison MR.  MRI HEAD  Findings:  Left paracentral posterior pontine hemorrhage extends into the inferior aspect of the left mid brain.  Surrounding vasogenic edema.  Mild deformity of the superior margin of the fourth ventricle.  This may represent a hypertensive hemorrhage. Cavernoma or vascular abnormality not excluded.  No definitive adjacent abnormal vasculature ( slightly prominent size  vessel left aspect of the pons but no definitive abnormal draining vein).  This area can be reevaluated as hemorrhage clears to exclude underlying lesion/ vascular abnormality.  No acute thrombotic infarct.  Enlarged calcified pineal gland measuring up to 1.3 cm with slight impression upon the superior colliculus.  This may represent an incidental finding.  If the patient develops diplopia with upper gaze then follow-up imaging to exclude growth of pituitary gland recommended.  Shallow sella and small pituitary gland.  Remote left external capsule infarct with encephalomalacia with a small amount of blood breakdown products.  Tiny blood breakdown products within the thalami.  These areas may represent result of micro hemorrhages from high blood pressure/small vessel disease type changes which were partially hemorrhagic.  Remote small infarcts of the thalami and basal ganglia.  Moderate small vessel disease type changes.  Global atrophy without hydrocephalus.  Paranasal sinus opacification most notable inferior aspect right maxillary sinus.  Mild exophthalmos.  Small mega cisterna magna.  IMPRESSION: Left paracentral posterior pontine hemorrhage extends into the inferior aspect of the left mid brain.  Surrounding vasogenic edema.  Mild deformity of the superior margin of the fourth ventricle.  This may represent a hypertensive hemorrhage.  Please see above.  No acute thrombotic infarct.  Enlarged calcified pineal gland measuring up to 1.3 cm with slight impression upon the superior colliculus as noted above.  Remote left external capsule infarct with encephalomalacia with a small amount of blood breakdown products.  Tiny blood breakdown products within the thalami.  These areas may represent result of micro hemorrhages from high blood pressure/small vessel disease type changes which were partially hemorrhagic.  Remote small infarcts of the thalami and basal ganglia.  Moderate small vessel disease type changes.   Global atrophy without hydrocephalus.  Paranasal sinus opacification most notable inferior aspect right maxillary sinus.  MRA HEAD  Findings: Ectatic cavernous segment of the internal carotid arteries and supraclinoid aspect without high-grade stenosis.  Mild to moderate focal narrowing proximal A1 segment left anterior cerebral artery.  Mild narrowing M1 segment right middle cerebral artery.  Slightly small caliber of the A2 segment of the left anterior cerebral artery.  Middle cerebral artery mild branch vessel irregularity.  Codominant vertebral arteries.  Mild to moderate focal stenosis left vertebral artery beyond the takeoff of the PICA.  Mild irregularity of the PICAs.  Small caliber basilar artery.  This may be related to the fetal type origin of the posterior cerebral arteries.  Nonvisualization AICAs.  Moderate  P2 segment and mild distal branch vessel irregularity of the posterior cerebral arteries bilaterally  No discrete aneurysm or definitive findings of vascular malformation.  IMPRESSION: Intracranial atherosclerotic type changes as noted above.  Original Report Authenticated By: Fuller Canada, [name]   Mr Brain Wo Contrast  02/18/2011  *RADIOLOGY REPORT*  Clinical Data:  Right-sided hemiparesis and nausea.  Evaluate pontine bleed. Hypertensive patient.  MRI HEAD WITHOUT CONTRAST MRA HEAD WITHOUT CONTRAST  Technique: Multiplanar, multiecho pulse sequences of the brain and surrounding structures were obtained according to standard protocol without intravenous contrast.  Angiographic images of the head were obtained using MRA technique without contrast.  Comparison: 02/17/2011 CT.  No comparison MR.  MRI HEAD  Findings:  Left paracentral posterior pontine hemorrhage extends into the inferior aspect of the left mid brain.  Surrounding vasogenic edema.  Mild deformity of the  superior margin of the fourth ventricle.  This may represent a hypertensive hemorrhage. Cavernoma or vascular abnormality not  excluded.  No definitive adjacent abnormal vasculature ( slightly prominent size vessel left aspect of the pons but no definitive abnormal draining vein).  This area can be reevaluated as hemorrhage clears to exclude underlying lesion/ vascular abnormality.  No acute thrombotic infarct.  Enlarged calcified pineal gland measuring up to 1.3 cm with slight impression upon the superior colliculus.  This may represent an incidental finding.  If the patient develops diplopia with upper gaze then follow-up imaging to exclude growth of pituitary gland recommended.  Shallow sella and small pituitary gland.  Remote left external capsule infarct with encephalomalacia with a small amount of blood breakdown products.  Tiny blood breakdown products within the thalami.  These areas may represent result of micro hemorrhages from high blood pressure/small vessel disease type changes which were partially hemorrhagic.  Remote small infarcts of the thalami and basal ganglia.  Moderate small vessel disease type changes.  Global atrophy without hydrocephalus.  Paranasal sinus opacification most notable inferior aspect right maxillary sinus.  Mild exophthalmos.  Small mega cisterna magna.  IMPRESSION: Left paracentral posterior pontine hemorrhage extends into the inferior aspect of the left mid brain.  Surrounding vasogenic edema.  Mild deformity of the superior margin of the fourth ventricle.  This may represent a hypertensive hemorrhage.  Please see above.  No acute thrombotic infarct.  Enlarged calcified pineal gland measuring up to 1.3 cm with slight impression upon the superior colliculus as noted above.  Remote left external capsule infarct with encephalomalacia with a small amount of blood breakdown products.  Tiny blood breakdown products within the thalami.  These areas may represent result of micro hemorrhages from high blood pressure/small vessel disease type changes which were partially hemorrhagic.  Remote small infarcts of  the thalami and basal ganglia.  Moderate small vessel disease type changes.  Global atrophy without hydrocephalus.  Paranasal sinus opacification most notable inferior aspect right maxillary sinus.  MRA HEAD  Findings: Ectatic cavernous segment of the internal carotid arteries and supraclinoid aspect without high-grade stenosis.  Mild to moderate focal narrowing proximal A1 segment left anterior cerebral artery.  Mild narrowing M1 segment right middle cerebral artery.  Slightly small caliber of the A2 segment of the left anterior cerebral artery.  Middle cerebral artery mild branch vessel irregularity.  Codominant vertebral arteries.  Mild to moderate focal stenosis left vertebral artery beyond the takeoff of the PICA.  Mild irregularity of the PICAs.  Small caliber basilar artery.  This may be related to the fetal type origin of the posterior cerebral arteries.  Nonvisualization AICAs.  Moderate  P2 segment and mild distal branch vessel irregularity of the posterior cerebral arteries bilaterally  No discrete aneurysm or definitive findings of vascular malformation.  IMPRESSION: Intracranial atherosclerotic type changes as noted above.  Original Report Authenticated By: Fuller Canada, [name]   Dg Chest Portable 1 View  02/17/2011  *RADIOLOGY REPORT*  Clinical Data: Vomiting and weakness.  Hemorrhagic brainstem stroke.  PORTABLE CHEST - 1 VIEW  Comparison: 09/23/2009 and CT of 09/24/2009.  Findings: Biapical emphysema.  Mild cardiomegaly. No pleural effusion or pneumothorax.  Numerous leads and wires project over the chest.  Lower lobe predominant interstitial thickening persists.  Possible superimposed left lower lobe airspace disease. This is at the site of probable scarring on the prior exam.  IMPRESSION:  1.  Cannot exclude early left lower lobe airspace disease. Infection or aspiration.  If PA and lateral radiographs are possible, they should be considered.  Alternately, short-term radiographic follow-up  recommended. 2.  Bolus emphysema with lower lobe predominant interstitial thickening.  Original Report Authenticated By: Consuello Bossier, [name]   Dg Swallowing Func-no Report  02/18/2011  CLINICAL DATA: dysphagia   FLUOROSCOPY FOR SWALLOWING FUNCTION STUDY:  Fluoroscopy was provided for swallowing function study, which was  administered by a speech pathologist.  Final results and recommendations  from this study are contained within the speech pathology report.      Assessment/Plan: Stable second day past pontine hemorrhage. MRI scan does not show any unexpected pathology.  LOS: 2 days  We will sign off at this time.   Jonathon [name] 02/19/2011, 11:02 AM

## 2011-02-20 ENCOUNTER — Inpatient Hospital Stay (HOSPITAL_COMMUNITY): Payer: Medicaid Other

## 2011-02-20 MED ORDER — POTASSIUM CHLORIDE 20 MEQ PO PACK
20.0000 meq | PACK | Freq: Every day | ORAL | Status: DC
  Filled 2011-02-20: qty 1

## 2011-02-20 MED ORDER — POTASSIUM CHLORIDE CRYS ER 20 MEQ PO TBCR
20.0000 meq | EXTENDED_RELEASE_TABLET | Freq: Every day | ORAL | Status: DC
  Administered 2011-02-20 – 2011-02-22 (×3): 20 meq via ORAL
  Filled 2011-02-20 (×4): qty 1

## 2011-02-20 MED ORDER — DULOXETINE HCL 30 MG PO CPEP
30.0000 mg | ORAL_CAPSULE | Freq: Every day | ORAL | Status: DC
  Administered 2011-02-20 – 2011-02-21 (×2): 30 mg via ORAL
  Filled 2011-02-20 (×3): qty 1

## 2011-02-20 MED ORDER — GABAPENTIN 400 MG PO CAPS
400.0000 mg | ORAL_CAPSULE | Freq: Three times a day (TID) | ORAL | Status: DC
  Administered 2011-02-20 – 2011-02-22 (×8): 400 mg via ORAL
  Filled 2011-02-20 (×9): qty 1

## 2011-02-20 NOTE — Progress Notes (Signed)
Stroke Team Progress Note  HISTORY Jonathon [name] is an 61 y.o. male who has been noncompliant with his medications and had sudden onset right sided hemiparesis with dysarthria and confusion and vomiting 02/17/2011. NIHSS of 4. t-PA not given due to left pontine hemorrhage. he was admitted to the neuro ICU for further evaluation and treatment.He was considered for the ATACH-2 study but brainstem hemorrhage was an exclusion.  SUBJECTIVE His wife is at the bedside. Overall he feels his condition is stable. The patient reports onset of burning sensations involve the right arm in particular, but also the right leg. The patient feels numbness on the entire right side, including the face. The patient denies any burning sensations on the body. The gabapentin so far has not been helpful.  The patient indicates that he is eating and drinking well.  OBJECTIVE Filed Vitals:   02/20/11 0500 02/20/11 0600 02/20/11 0700 02/20/11 0800  BP: 149/92 147/100 155/87 152/97  Pulse: 62 66 61 65  Temp:    98.4 F (36.9 C)  TempSrc:    Oral  Resp: 19 12 15 20   Height:      Weight:      SpO2: 93% 92% 93% 95%   CBG (last 3)   Basename 02/17/11 1122  GLUCAP 129*   Intake/Output from previous day: 02/02 0701 - 02/03 0700 In: 480 [P.O.:480] Out: 1625 [Urine:1625]  IV Fluid Intake:      . sodium chloride Stopped (02/18/11 0900)  . niCARDipine Stopped (02/19/11 0200)   Medications     . gabapentin  300 mg Oral TID  . hydrochlorothiazide  25 mg Oral Daily  . lisinopril  5 mg Oral Daily  . pantoprazole  40 mg Oral Q1200  . senna-docusate  1 tablet Oral BID  . DISCONTD: pantoprazole (PROTONIX) IV  40 mg Intravenous QHS  PRN acetaminophen, acetaminophen, HYDROcodone-acetaminophen, labetalol, ondansetron (ZOFRAN) IV  Diet:  Cardiac  Activity:  Bedrest   DVT Prophylaxis:  SCDs   Significant Diagnostic Studies: CBC    Component Value Date/Time   WBC 12.2* 02/17/2011 1105   RBC 5.22 02/17/2011  1105   HGB 15.1 02/17/2011 1105   HCT 46.3 02/17/2011 1105   PLT 211 02/17/2011 1105   MCV 88.7 02/17/2011 1105   MCH 28.9 02/17/2011 1105   MCHC 32.6 02/17/2011 1105   RDW 14.2 02/17/2011 1105   LYMPHSABS 5.8* 02/17/2011 1105   MONOABS 1.1* 02/17/2011 1105   EOSABS 0.1 02/17/2011 1105   BASOSABS 0.0 02/17/2011 1105   CMP    Component Value Date/Time   NA 138 02/19/2011 0611   K 3.3* 02/19/2011 0611   CL 102 02/19/2011 0611   CO2 22 02/19/2011 0611   GLUCOSE 115* 02/19/2011 0611   BUN 9 02/19/2011 0611   CREATININE 0.83 02/19/2011 0611   CALCIUM 9.4 02/19/2011 0611   PROT 7.7 02/17/2011 1105   ALBUMIN 3.6 02/17/2011 1105   AST 15 02/17/2011 1105   ALT 14 02/17/2011 1105   ALKPHOS 88 02/17/2011 1105   BILITOT 0.4 02/17/2011 1105   GFRNONAA >90 02/19/2011 0611   GFRAA >90 02/19/2011 0611   COAGS Lab Results  Component Value Date   INR 1.01 02/17/2011   INR 1.10 09/24/2009   INR 0.99 09/23/2009   Lipid Panel    Component Value Date/Time   CHOL 133 02/18/2011 0335   TRIG 56 02/18/2011 0335   HDL 50 02/18/2011 0335   CHOLHDL 2.7 02/18/2011 0335   VLDL 11 02/18/2011 0335  LDLCALC 72 02/18/2011 0335   HgbA1C  Lab Results  Component Value Date   HGBA1C 5.7* 02/18/2011   Urine Drug Screen    Component Value Date/Time   LABOPIA NONE DETECTED 02/17/2011 1918   COCAINSCRNUR NONE DETECTED 02/17/2011 1918   LABBENZ NONE DETECTED 02/17/2011 1918   AMPHETMU NONE DETECTED 02/17/2011 1918   THCU NONE DETECTED 02/17/2011 1918   LABBARB NONE DETECTED 02/17/2011 1918    Alcohol Level No results found for this basename: eth  Cardiac enzymes neg MRSA neg   CT of the brain   02/17/2011   1.  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  No mass effect or significant surrounding edema at this time. 2.  Questionable early extension into the left fourth ventricle. Alternatively, this could reflect asymmetric coronoid calcification. 3.  No other acute intracranial hemorrhage.  Chronic small vessel ischemia suspected.     Repeat CT of the head 02/20/11:   IMPRESSION:  Interval slight increase in size of pontine hemorrhage as  previously diagnosed.   MRI of the brain   IMPRESSION:  Left paracentral posterior pontine hemorrhage extends into the  inferior aspect of the left mid brain. Surrounding vasogenic  edema. Mild deformity of the superior margin of the fourth  ventricle. This may represent a hypertensive hemorrhage. Please  see above.    MRA of the brain   IMPRESSION:  Intracranial atherosclerotic type changes as noted above.    2D Echocardiogram    Study Conclusions  - Left ventricle: The cavity size was normal. Wall thickness was increased in a pattern of moderate LVH. Systolic function was normal. The estimated ejection fraction was in the range of 55% to 60%. Wall motion was normal; there were no regional wall motion abnormalities. Doppler parameters are consistent with abnormal left ventricular relaxation (grade 1 diastolic dysfunction). - Left atrium: The atrium was mildly dilated. Impressions:  - No cardiac source of emboli was indentified.   Carotid Doppler    Vascular Ultrasound  Carotid Duplex (Doppler) has been completed. Preliminary findings: Bilaterally no significant ICA stenosis with antegrade vertebral flow.     CXR   1.  Cannot exclude early left lower lobe airspace disease. Infection or aspiration.  If PA and lateral radiographs are possible, they should be considered.  Alternately, short-term radiographic follow-up recommended. 2.  Bolus emphysema with lower lobe predominant interstitial thickening.   EKG  normal sinus rhythm.   Physical Exam    The patient is alert and cooperative at the time of the examination. The speech is somewhat hoarse, not aphasic.  Respiratory examination is clear.  Cardiovascular examination reveals a regular rate and rhythm, no obvious murmurs or rubs noted.  Abdomen reveals good bowel sounds, nontender.  The patient has  mild weakness with grip of the right arm, and dysmetria with finger-nose-finger with the right arm and with toe to finger with the right leg.  No ataxia was seen on the left side of body.  No drift is seen with the upper extremities.  The patient has symmetric reflexes, toes are downgoing bilaterally.  The patient was not ambulated.  Visual fields are full. Slight decrease in the right nasolabial fold is noted.   ASSESSMENT [name] is a 61 y.o. male with a pontine hemorrhage with vasogenic edemia, secondary to malignant hypertension. On IV cardene for BP control.  -hypokalemia, 3.0 -leukocytosis, 12.2 -noncompliance with medications  Neuropathic pain on the right body  EKG changes, normal troponin I  and CK-MB levels.  Hospital day # 3  TREATMENT/PLAN  The patient appears to be stable at this point. A CT scan of the head was checked, and was read out as a mild increase in size of the pontine hemorrhage. I have reviewed the scan, and the changes very minimal. The patient remains quite stable. The patient continues to complain of significant pain on the right arm and right leg. The patient will be up in a chair today.  The patient reports neuropathic pain developing on the right side of the body. The gabapentin will be increased, Cymbalta will be added.  Patient will be mobilized. Rehabilitation consult will be obtained.  The patient will be continued on blood pressure medications, and blood pressures will be monitored.  The patient will be transferred to the floor today, and blood work will be rechecked in the morning.   Dianah Field [facility] Pager: [phone] 02/20/2011 8:58 AM

## 2011-02-21 ENCOUNTER — Inpatient Hospital Stay (HOSPITAL_COMMUNITY): Payer: Medicaid Other

## 2011-02-21 DIAGNOSIS — I619 Nontraumatic intracerebral hemorrhage, unspecified: Secondary | ICD-10-CM

## 2011-02-21 MED ORDER — NICOTINE 14 MG/24HR TD PT24
14.0000 mg | MEDICATED_PATCH | TRANSDERMAL | Status: DC
  Administered 2011-02-21 – 2011-02-22 (×2): 14 mg via TRANSDERMAL
  Filled 2011-02-21 (×2): qty 1

## 2011-02-21 MED ORDER — LISINOPRIL 10 MG PO TABS
10.0000 mg | ORAL_TABLET | Freq: Every day | ORAL | Status: DC
  Administered 2011-02-22: 10 mg via ORAL
  Filled 2011-02-21: qty 1

## 2011-02-21 MED ORDER — LISINOPRIL 5 MG PO TABS
5.0000 mg | ORAL_TABLET | Freq: Once | ORAL | Status: AC
  Administered 2011-02-21: 5 mg via ORAL
  Filled 2011-02-21: qty 1

## 2011-02-21 NOTE — Consult Note (Signed)
Physical Medicine and Rehabilitation Consult Reason for Consult: Pontine hemorrhagic infarction Referring Phsyician: Dr. Wendall Mola is an 61 y.o. male.   HPI: 61 year old right-handed male with history of hypertension as well as congestive heart failure with noted noncompliance of medications who was admitted January 31st with right-sided weakness and slurred speech as well as altered mental status with vomiting. Cranial CT scan showed acute left brain stem hemorrhage centered at the pons without mass effect. Blood pressure noted to be elevated at 161/143 and placed on Cardene drip. Echocardiogram with ejection fraction 60% and grade 1 diastolic dysfunction. Carotid Dopplers with no internal carotid artery stenosis. Followup cranial CT scan on January 3 shows interval slight increase in size of pontine hemorrhage now measuring 1.2 x 1.0 cm. Neurosurgery Dr. Danielle Dess consulted advise conservative care and management of blood pressure. Physical medicine and rehabilitation was consulted for consideration of inpatient rehabilitation services.  Review of Systems  Constitutional: Positive for malaise/fatigue.  Respiratory: Negative for cough and shortness of breath.   Cardiovascular: Negative for chest pain.  Gastrointestinal: Positive for vomiting.  Genitourinary: Negative.   Musculoskeletal: Negative for myalgias.  Neurological: Positive for dizziness and headaches.  Psychiatric/Behavioral: Negative.    Past Medical History  Diagnosis Date  . CHF (congestive heart failure)   . COPD (chronic obstructive pulmonary disease)   . Hypertension   . Smoker    No past surgical history on file. No family history on file. Social History:  reports that he has been smoking Cigarettes.  He does not have any smokeless tobacco history on file. His alcohol and drug histories not on file. Allergies: No Known Allergies Medications Prior to Admission  Medication Dose Route Frequency Provider Last  Rate Last Dose  . 0.9 %  sodium chloride infusion   Intravenous Continuous Carmell Austria, MD      . acetaminophen (TYLENOL) tablet 650 mg  650 mg Oral Q4H PRN Carmell Austria, MD   650 mg at 02/19/11 1610   Or  . acetaminophen (TYLENOL) suppository 650 mg  650 mg Rectal Q4H PRN Carmell Austria, MD      . DULoxetine (CYMBALTA) DR capsule 30 mg  30 mg Oral Daily Lesly Dukes, MD   30 mg at 02/20/11 1035  . gabapentin (NEURONTIN) capsule 400 mg  400 mg Oral TID Lesly Dukes, MD   400 mg at 02/20/11 2157  . hydrochlorothiazide (HYDRODIURIL) tablet 25 mg  25 mg Oral Daily Annie Main, NP   25 mg at 02/20/11 1035  . HYDROcodone-acetaminophen (NORCO) 5-325 MG per tablet 1 tablet  1 tablet Oral Q4H PRN Lesly Dukes, MD   1 tablet at 02/20/11 1140  . labetalol (NORMODYNE,TRANDATE) injection 10-40 mg  10-40 mg Intravenous Q10 min PRN Carmell Austria, MD   10 mg at 02/21/11 0526  . labetalol (NORMODYNE,TRANDATE) injection 5 mg  5 mg Intravenous Once Carmell Austria, MD   5 mg at 02/17/11 1112  . labetalol (NORMODYNE,TRANDATE) injection 5 mg  5 mg Intravenous Once Carmell Austria, MD      . lisinopril (PRINIVIL,ZESTRIL) tablet 5 mg  5 mg Oral Daily Annie Main, NP   5 mg at 02/20/11 1035  . niCARdipine (CARDENE-IV) infusion (0.1 mg/ml)  5 mg/hr Intravenous Once Carmell Austria, MD 50 mL/hr at 02/17/11 1144 5 mg/hr at 02/17/11 1144  . ondansetron (ZOFRAN) 4 MG/2ML injection        4 mg at 02/17/11 1050  . ondansetron (ZOFRAN) injection 4 mg  4 mg  Intravenous Q6H PRN Carmell Austria, MD   4 mg at 02/18/11 0756  . pantoprazole (PROTONIX) EC tablet 40 mg  40 mg Oral Q1200 Severiano Gilbert, PHARMD   40 mg at 02/20/11 1140  . potassium chloride SA (K-DUR,KLOR-CON) CR tablet 20 mEq  20 mEq Oral Daily Crystal [place], PHARMD   20 mEq at 02/20/11 1300  . potassium chloride SA (K-DUR,KLOR-CON) CR tablet 40 mEq  40 mEq Oral Once Annie Main, NP   40 mEq at 02/18/11 1347  . senna-docusate (Senokot-S) tablet 1  tablet  1 tablet Oral BID Carmell Austria, MD   1 tablet at 02/20/11 2157  . DISCONTD: gabapentin (NEURONTIN) capsule 300 mg  300 mg Oral TID Lesly Dukes, MD   300 mg at 02/19/11 2100  . DISCONTD: labetalol (NORMODYNE,TRANDATE) injection 10 mg  10 mg Intravenous Q10 min PRN Carmell Austria, MD   5 mg at 02/17/11 1118  . DISCONTD: niCARdipine (CARDENE-IV) infusion (0.1 mg/ml)  5 mg/hr Intravenous Continuous Carmell Austria, MD 30 mL/hr at 02/18/11 1535 3 mg/hr at 02/18/11 1535  . DISCONTD: niCARdipine (CARDENE-IV) infusion (0.1 mg/ml)  5 mg/hr Intravenous Continuous Annie Main, NP   3 mg/hr at 02/19/11 0100  . DISCONTD: pantoprazole (PROTONIX) injection 40 mg  40 mg Intravenous QHS Carmell Austria, MD   40 mg at 02/18/11 2156  . DISCONTD: potassium chloride (KLOR-CON) packet 20 mEq  20 mEq Oral Daily Lesly Dukes, MD      . DISCONTD: potassium chloride 10 mEq in 100 mL IVPB  10 mEq Intravenous Q1 Hr x 4 Annie Main, NP       No current outpatient prescriptions on file as of 02/21/2011.    Home: Home Living Lives With: Spouse Receives Help From: Family Type of Home: House Home Layout: Two level Alternate Level Stairs-Rails: Right Alternate Level Stairs-Number of Steps: one flight Home Access: Stairs to enter Entrance Stairs-Rails: Right Entrance Stairs-Number of Steps: 5 Home Adaptive Equipment: None  Functional History: Prior Function Level of Independence: Independent with basic ADLs;Independent with homemaking with ambulation;Independent with gait;Independent with transfers Driving: Yes Vocation: Full time employment Functional Status:  Mobility: Bed Mobility Bed Mobility: Yes Supine to Sit: 4: Min assist;HOB elevated (Comment degrees) (30 degrees) Supine to Sit Details (indicate cue type and reason): increased effort on pt's part but only needing minimal tactile cues for follow through and sequencing; pt limiting the use of his RUE and needing cues to actually use it during the  transfer Sitting - Scoot to Edge of Bed: 5: Supervision Sitting - Scoot to Edge of Bed Details (indicate cue type and reason): verbal cues to use his RUE to assist Transfers Transfers: Yes Sit to Stand: 1: +2 Total assist;From bed;With upper extremity assist (75%) Sit to Stand Details (indicate cue type and reason): pt initially reaching out for therapist and rehab tech to pull him up; with verbal cues to position hands on the bed and minimal facilitation for anterior translation in prep to stand pt able to stand with +2totalpt75% to steady him and assist with follow through; pt very fearful Stand to Sit: With armrests;With upper extremity assist;To chair/3-in-1;4: Min assist Stand to Sit Details: cues for encouragement and hand placement on chair; when pt forced to stand unsupported in prep to sit he lost his footing slightly and teetered to the left requiring assist to steady himself; cues for hands on arm rests and assist to guide him to chair slowly Ambulation/Gait Ambulation/Gait: Yes Ambulation/Gait Assistance: 1: +  2 Total assist Ambulation/Gait Assistance Details (indicate cue type and reason): pt amb approx 6 ft with bilateral HHA and facilitation around waist to give pt confidence and assist with weight shift and reciprocal steps; pt with slight ataxic step on right with decreased stance time on right throwinig him off balance during gait; pt very fearful (RR increased to 30s but he does well with calming cues for deep breathing)  Ambulation Distance (Feet): 6 Feet Assistive device: 2 person hand held assist Gait Pattern: Ataxic;Trunk flexed;Decreased stance time - right;Decreased step length - right Stairs: No Wheelchair Mobility Wheelchair Mobility: No  ADL:    Cognition: Cognition Arousal/Alertness: Awake/alert Orientation Level: Oriented X4 Cognition Arousal/Alertness: Awake/alert Overall Cognitive Status: Appears within functional limits for tasks assessed Orientation  Level: Oriented X4 Cognition - Other Comments: slightly self limiting at first with decreased insight into his condition; kept saying he couldn't move his RLE/RUE althought they were obviously moving in the bed; anxious  Blood pressure 162/93, pulse 58, temperature 98.3 F (36.8 C), temperature source Oral, resp. rate 20, height 6' (1.829 m), weight 90.6 kg (199 lb 11.8 oz), SpO2 95.00%. Physical Exam  Constitutional: He is oriented to person, place, and time. He appears well-developed.  HENT:  Head: Normocephalic.  Neck: Normal range of motion. Neck supple. No thyromegaly present.  Cardiovascular: Normal rate and regular rhythm.   Pulmonary/Chest: Breath sounds normal. He has no wheezes.  Abdominal: He exhibits no distension. There is no tenderness.  Musculoskeletal: He exhibits no edema.  Neurological: He is alert and oriented to person, place, and time.       Patient's alert and oriented x3. Speech may be slightly dysarthric. He has mild sensory loss of the right tongue and face. Sensory exam over right arm and leg as one out of 2. He can sense light touch and pain however. Strength in the upper extremity on the right is 3-3+ out of 5 proximal to distal. Right lower extremities 2+ to 3/5 proximal to 4/5 distally. Reflexes are 1+. Cognitively he is intact and follows all commands and answers  Go questions. Insight and awareness are appropriate.  Skin: Skin is warm and dry.  Psychiatric: He has a normal mood and affect.    No results found for this or any previous visit (from the past 24 hour(s)). Ct Head Wo Contrast  02/20/2011  *RADIOLOGY REPORT*  Clinical Data: Pontine hemorrhage, follow-up  CT HEAD WITHOUT CONTRAST  Technique:  Contiguous axial images were obtained from the base of the skull through the vertex without contrast.  Comparison: MRI 02/18/2011, head CT 02/17/2011  Findings: There has been interval increase in size of left pontine hemorrhage as compared to the similar  comparison study, now 1.2 x 1.0 cm.  No new acute hemorrhage, acute infarction, or mass lesion. Mild diffuse cortical volume loss with proportional ventricular prominence again noted.  Basal ganglial lacunar infarcts are stable.  No midline shift.  IMPRESSION: Interval slight increase in size of pontine hemorrhage as previously diagnosed.  Original Report Authenticated By: Harrel Lemon, [name]    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 Does the need for close, 24 hr/day medical supervision in concert with the patient's rehab needs make it unreasonable for this patient to be served in a less intensive setting? Yes,  2. Co-Morbidities requiring supervision/potential complications: Hypertension, CHF, COPD 3. Due to bladder management, bowel management, safety, skin/wound care, medication administration and patient education, does the patient require 24 hr/day  rehab nursing? Yes 4. Does the patient require coordinated care of a physician, rehab nurse, PT (1-2 hrs/day, 5 days/week), OT (1-2 hrs/day, 5 days/week) and SLP (1-2 hrs/day, 5 days/week) to address physical and functional deficits in the context of the above medical diagnosis(es)? Yes Addressing deficits in the following areas: balance, endurance, locomotion, strength, transferring, bowel/bladder control, bathing, dressing, feeding, grooming, toileting, cognition, speech and psychosocial support 5. Can the patient actively participate in an intensive therapy program of at least 3 hrs of therapy per day at least 5 days per week? Yes 6. The potential for patient to make measurable gains while on inpatient rehab is excellent 7. Anticipated functional outcomes upon discharge from inpatients are supervision to modified in the PT, supervision to modified independent OT, modified independent SLP 8. Estimated rehab length of stay to reach the above functional goals is: 2 weeks 9. Does the  patient have adequate social supports to accommodate these discharge functional goals? Yes 10. Anticipated D/C setting: Home 11. Anticipated post D/C treatments: HH therapy 12. Overall Rehab/Functional Prognosis: excellent  RECOMMENDATIONS: This patient's condition is appropriate for continued rehabilitative care in the following setting: CIR Patient has agreed to participate in recommended program. Yes Note that insurance prior authorization may be required for reimbursement for recommended care.  Comment: Rehabilitation nurse to followup.   Ivory Broad, MD 02/21/2011

## 2011-02-21 NOTE — Progress Notes (Signed)
Speech Language/Pathology Patient was d/c'd by MD from ST.  Brief screen/follow-up assessment of diet tolerance reveals patient is tolerating a Regular diet with thin liquids without difficulty.  Patient does have a harsh voice quality, but reports it has been this way all his life.  SLP will sign-off at this time, as goals have been met.  Maryjo Rochester T

## 2011-02-21 NOTE — Progress Notes (Signed)
Patient with BP 180/102 manual; pulse 54 apical. Had scheduled BP meds @ 1010; prn labetalol not given secondary to pulse. Annie Main, NP on unit and notified. Will monitor.

## 2011-02-21 NOTE — Progress Notes (Signed)
PT Cancellation Note  Treatment cancelled at this time due to pt's BP 175/93.   RN administered BP meds approx. 30 mins before checking BP.   Will attempt back later today if time allows.     Lara Mulch 02/21/2011, 10:56 AM [phone]

## 2011-02-21 NOTE — Progress Notes (Addendum)
Stroke Team Progress Note  HISTORY Jonathon [name] is an 61 y.o. male who has been noncompliant with his medications and had sudden onset right sided hemiparesis with dysarthria and confusion and vomiting 02/17/2011. NIHSS of 4. t-PA not given due to left pontine hemorrhage. he was admitted to the neuro ICU for further evaluation and treatment.He was considered for the ATACH-2 study but brainstem hemorrhage was an exclusion.  SUBJECTIVE His wife is at the bedside. Overall he feels his condition is stable.He has been seen by rehab and await their opinion related to transfer there. RN reports elevated BP this am with HR 54.  OBJECTIVE Filed Vitals:   02/21/11 0625 02/21/11 1021 02/21/11 1140 02/21/11 1145  BP: 162/93 170/102 176/114 180/102  Pulse: 58 66 55 54  Temp:  97.9 F (36.6 C)    TempSrc:  Oral    Resp:  20    Height:      Weight:      SpO2:  94%     CBG (last 3)  No results found for this basename: GLUCAP:3 in the last 72 hours Intake/Output from previous day: 02/03 0701 - 02/04 0700 In: -  Out: 300 [Urine:300]  IV Fluid Intake:     . sodium chloride Stopped (02/18/11 0900)   Medications    . DULoxetine  30 mg Oral Daily  . gabapentin  400 mg Oral TID  . hydrochlorothiazide  25 mg Oral Daily  . lisinopril  10 mg Oral Daily  . lisinopril  5 mg Oral Once  . pantoprazole  40 mg Oral Q1200  . potassium chloride  20 mEq Oral Daily  . senna-docusate  1 tablet Oral BID  . DISCONTD: lisinopril  5 mg Oral Daily  PRN acetaminophen, acetaminophen, HYDROcodone-acetaminophen, labetalol, ondansetron (ZOFRAN) IV  Diet:  Cardiac  Activity:  Up with assistance DVT Prophylaxis:  SCDs   Significant Diagnostic Studies: CBC    Component Value Date/Time   WBC 12.2* 02/17/2011 1105   RBC 5.22 02/17/2011 1105   HGB 15.1 02/17/2011 1105   HCT 46.3 02/17/2011 1105   PLT 211 02/17/2011 1105   MCV 88.7 02/17/2011 1105   MCH 28.9 02/17/2011 1105   MCHC 32.6 02/17/2011 1105   RDW 14.2  02/17/2011 1105   LYMPHSABS 5.8* 02/17/2011 1105   MONOABS 1.1* 02/17/2011 1105   EOSABS 0.1 02/17/2011 1105   BASOSABS 0.0 02/17/2011 1105   CMP    Component Value Date/Time   NA 138 02/19/2011 0611   K 3.3* 02/19/2011 0611   CL 102 02/19/2011 0611   CO2 22 02/19/2011 0611   GLUCOSE 115* 02/19/2011 0611   BUN 9 02/19/2011 0611   CREATININE 0.83 02/19/2011 0611   CALCIUM 9.4 02/19/2011 0611   PROT 7.7 02/17/2011 1105   ALBUMIN 3.6 02/17/2011 1105   AST 15 02/17/2011 1105   ALT 14 02/17/2011 1105   ALKPHOS 88 02/17/2011 1105   BILITOT 0.4 02/17/2011 1105   GFRNONAA >90 02/19/2011 0611   GFRAA >90 02/19/2011 0611   COAGS Lab Results  Component Value Date   INR 1.01 02/17/2011   INR 1.10 09/24/2009   INR 0.99 09/23/2009   Lipid Panel    Component Value Date/Time   CHOL 133 02/18/2011 0335   TRIG 56 02/18/2011 0335   HDL 50 02/18/2011 0335   CHOLHDL 2.7 02/18/2011 0335   VLDL 11 02/18/2011 0335   LDLCALC 72 02/18/2011 0335   HgbA1C  Lab Results  Component Value Date   HGBA1C  5.7* 02/18/2011   Urine Drug Screen    Component Value Date/Time   LABOPIA NONE DETECTED 02/17/2011 1918   COCAINSCRNUR NONE DETECTED 02/17/2011 1918   LABBENZ NONE DETECTED 02/17/2011 1918   AMPHETMU NONE DETECTED 02/17/2011 1918   THCU NONE DETECTED 02/17/2011 1918   LABBARB NONE DETECTED 02/17/2011 1918    Alcohol Level No results found for this basename: eth  Cardiac enzymes neg MRSA neg   CT of the brain   02/20/2011    Interval slight increase in size of pontine hemorrhage as previously diagnosed. 02/17/2011   1.  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  No mass effect or significant surrounding edema at this time. 2.  Questionable early extension into the left fourth ventricle. Alternatively, this could reflect asymmetric coronoid calcification. 3.  No other acute intracranial hemorrhage.  Chronic small vessel ischemia suspected.    MRI of the brain  Left paracentral posterior pontine hemorrhage extends into the    inferior aspect of the left mid brain. Surrounding vasogenic  edema. Mild deformity of the superior margin of the fourth  ventricle. This may represent a hypertensive hemorrhage.   MRA of the brain  Intracranial atherosclerotic type changes   2D Echocardiogram  EF 55-60% with no source of embolus.   Carotid Doppler  No internal carotid artery stenosis bilaterally. Vertebrals with antegrade flow bilaterally.   CXR   1.  Cannot exclude early left lower lobe airspace disease. Infection or aspiration.  If PA and lateral radiographs are possible, they should be considered.  Alternately, short-term radiographic follow-up recommended. 2.  Bolus emphysema with lower lobe predominant interstitial thickening.   EKG  normal sinus rhythm.   Physical Exam    The patient is alert and cooperative at the time of the examination. The speech is somewhat hoarse, not aphasic.  Respiratory examination is clear.  Cardiovascular examination reveals a regular rate and rhythm, no obvious murmurs or rubs noted.  Abdomen reveals good bowel sounds, nontender.  The patient has mild weakness with grip of the right arm, and dysmetria with finger-nose-finger with the right arm and with toe to finger with the right leg.  No ataxia was seen on the left side of body.  No drift is seen with the upper extremities.  The patient has symmetric reflexes, toes are downgoing bilaterally.  The patient was not ambulated.  Visual fields are full. Slight decrease in the right nasolabial fold is noted.  ASSESSMENT Jonathon [name] is a 61 y.o. male with a pontine hemorrhage with vasogenic edemia, secondary to malignant hypertension. BP remains high.  -hypertension -hypokalemia, 3.3, on potassium supplement -leukocytosis, 12.2 -noncompliance with medications -right hemiparesis secondary to stroke -Neuropathic pain on the right body,  gabapentin increased, Cymbalta added -cigarette smoker  Hospital day #  4  TREATMENT/PLAN -increase lisinopril to 10 mg daily. Additional 5 mg today -nicotine patch -await rehab bed  Crixus Mcaulay, AVNP, ANP-BC, GNP-BC [facility] Pager: [phone] 02/21/2011 12:47 PM  Dr. Delia Heady, Stroke Center Medical Director, has personally reviewed chart, pertinent data, examined the patient and developed the plan of care.

## 2011-02-21 NOTE — Progress Notes (Signed)
Called to patient's room due to patient generally not feeling well. C/o nausea, dizziness and numbness of tongue. No change in strength. Labetalol last given @ 1735 with some decrease in BP. Labetalol given by on-coming RN secondary to issues with patient's IV site. Discussed changes with on-coming RN. Dr. Lovett Calender paged, awaiting return call.

## 2011-02-21 NOTE — Evaluation (Signed)
Occupational Therapy Evaluation Patient Details Name: Jonathon [name] MRN: [id number] DOB: [birth year] Today's Date: 02/21/2011 9:53-10:25  Problem List: There is no problem list on file for this patient.   Past Medical History:  Past Medical History  Diagnosis Date  . CHF (congestive heart failure)   . COPD (chronic obstructive pulmonary disease)   . Hypertension   . Smoker    Past Surgical History: No past surgical history on file.  OT Assessment/Plan/Recommendation OT Assessment Clinical Impression Statement: This 61 yo male admitted with sudden onset right sided hemiparesis with dysathria and confusion and vomiting. NIHSS of 4 found to have a left potine hemorrhage presents to acute OT with problems below thus affecting pt's PLOF at I with all BADLs/IADLs. Will benefit from acute OT and inpatient OT to get back to an I level.  OT Recommendation/Assessment: Patient will need skilled OT in the acute care venue OT Problem List: Decreased strength;Decreased range of motion;Impaired balance (sitting and/or standing);Decreased coordination;Decreased knowledge of use of DME or AE;Pain;Decreased cognition Barriers to Discharge: None OT Therapy Diagnosis : Generalized weakness;Cognitive deficits;Hemiplegia dominant side OT Plan OT Frequency: Min 2X/week OT Treatment/Interventions: Self-care/ADL training;Neuromuscular education;DME and/or AE instruction;Therapeutic activities;Cognitive remediation/compensation;Patient/family education;Balance training OT Recommendation Recommendations for Other Services: Rehab consult Follow Up Recommendations: Inpatient Rehab Equipment Recommended: Defer to next venue Individuals Consulted Consulted and Agree with Results and Recommendations: Patient OT Goals Acute Rehab OT Goals OT Goal Formulation: With patient Time For Goal Achievement: 7 days  ADL Goals Pt Will Perform Grooming: with set-up;with supervision;Standing at sink;Unsupported;Other  (comment) (at least 2 tasks) ADL Goal: Grooming - Progress: Goal set today  Pt Will Perform Upper Body Dressing: with set-up;with supervision;Sitting, chair;Sitting, bed;Unsupported ADL Goal: Upper Body Dressing - Progress: Goal set today  Pt Will Perform Lower Body Dressing: with set-up;with supervision;Sit to stand from chair;Sit to stand from bed;Supported;Other (comment) (with min guard A standing) ADL Goal: Lower Body Dressing - Progress: Goal set today  Pt Will Transfer to Toilet: with min assist;Ambulation;Regular height toilet;Grab bars ADL Goal: Toilet Transfer - Progress: Goal set today  Pt Will Perform Toileting - Clothing Manipulation: Independently;Standing ADL Goal: Toileting - Clothing Manipulation - Progress: Goal set today  Pt Will Perform Toileting - Hygiene: Independently;Sitting on 3-in-1 or toilet ADL Goal: Toileting - Hygiene - Progress: Goal set today  Additional ADL Goal #1: Pt will be S to get out of bed with HOB flat and no rails as well as without verbal cues for technique ADL Goal: Additional Goal #1 - Progress: Goal set today  Arm Goals Additional Arm Goal #1: Pt will be I with RUE AROM exercises to increase strength and decrease pain Arm Goal: Additional Goal #1 - Progress: Goal set today  OT Evaluation Precautions/Restrictions  Precautions Precautions: Fall Required Braces or Orthoses: No Restrictions Weight Bearing Restrictions: No Prior Functioning Home Living Lives With: Spouse Receives Help From: Family Type of Home: House Home Layout: Multi-level Alternate Level Stairs-Rails: Right Alternate Level Stairs-Number of Steps: one flight up and another down Home Access: Stairs to enter Entrance Stairs-Rails: Right Entrance Stairs-Number of Steps: 5 Bathroom Shower/Tub: Forensic scientist: Standard Bathroom Accessibility: Yes How Accessible: Accessible via walker Home Adaptive Equipment: None Prior Function Level of  Independence: Independent with basic ADLs;Independent with homemaking with ambulation;Independent with transfers;Independent with gait Driving: Yes Vocation: Full time employment ADL ADL Eating/Feeding: Simulated;Modified independent;Other (comment) (increased time due to decreased use of RUE) Where Assessed - Eating/Feeding: Edge of bed Grooming: Simulated;Supervision/safety;Set up;Other (comment) (increased  time due to decreased use of RUE) Where Assessed - Grooming: Sitting, bed;Unsupported Upper Body Bathing: Simulated;Supervision/safety;Set up;Other (comment) (increased time due to decreased [place] of RUE) Where Assessed - Upper Body Bathing: Unsupported;Sitting, bed Lower Body Bathing: Moderate assistance;Simulated (increased time due to decreased use of RUE, standing balance) Where Assessed - Lower Body Bathing: Sitting, bed;Supported;Other (comment) (raised bed since bed at home is higher) Upper Body Dressing: Simulated;Minimal assistance;Other (comment) (increased time due to decreased use of RUE) Where Assessed - Upper Body Dressing: Unsupported;Sitting, bed Lower Body Dressing: Moderate assistance;Other (comment) (increased time due to decreased use of RUE, standing balance) Where Assessed - Lower Body Dressing: Supported;Sit to stand from bed (raised since bed at home is higher) Toilet Transfer: Simulated;Minimal assistance Toilet Transfer Details (indicate cue type and reason): Bed to chair going to pt's right with two surfaces right next to each other, no more activity than this due to increased BP Toilet Transfer Method:  (stand turn) Toileting - Clothing Manipulation: Simulated;Minimal assistance;Other (comment) (increased time due to decreased use of RUE, standing balance) Where Assessed - Toileting Clothing Manipulation: Standing Toileting - Hygiene: Simulated;Minimal assistance;Other (comment) (increased time due to decreased use of RUE) Where Assessed - Toileting Hygiene: Sit  to stand from 3-in-1 or toilet Tub/Shower Transfer: Not assessed Equipment Used:  (none) Vision/Perception  Vision - History Baseline Vision: Wears glasses for distance only Patient Visual Report: No change from baseline Vision - Assessment Eye Alignment: Within Functional Limits Vision Assessment: Vision tested Ocular Range of Motion: Within Functional Limits Alignment/Gaze Preference: Within Defined Limits Tracking/Visual Pursuits: Able to track stimulus in all quads without difficulty Saccades: Within functional limits Convergence: Within functional limits Visual Fields: No apparent deficits Perception Perception: Within Functional Limits Cognition Cognition Arousal/Alertness: Awake/alert Overall Cognitive Status: Impaired Orientation Level: Oriented X4 Problem Solving: Requires assistance for problem solving Cognition - Other Comments: Wanted me to help him up OOB by letting him pull up holding onto my arm--I told him that I need to help teach him how to get up out of the bed by himself safely and he agreeed Sensation/Coordination Sensation Light Touch: Not tested (feels numb on the right--"pins and needles") Proprioception: Not tested Coordination Gross Motor Movements are Fluid and Coordinated: No Fine Motor Movements are Fluid and Coordinated: No Coordination and Movement Description: decreased for RUE gross and fine motor coordination due to decreased strength and painful right shoulder Extremity Assessment RUE Assessment RUE Assessment: Exceptions to [facility] RUE AROM (degrees) RUE Overall AROM Comments: 0-90 degrees shoulder flexion and abduction without pain, rest WFL but slow RUE PROM (degrees) RUE Overall PROM Comments: 0-145 degrees shoulder flexion and abduction without pain RUE Strength RUE Overall Strength Comments: 2/5 shoulder, rest 3/5  LUE Assessment LUE Assessment: Within Functional Limits Mobility  Bed Mobility Bed Mobility: Yes Rolling Left: 4: Min  assist;Other (comment) (max VCs for sequencing) Left Sidelying to Sit: 4: Min assist;HOB flat (max VCs for sequencing) Sitting - Scoot to Edge of Bed: 5: Supervision Transfers Transfers: Yes Sit to Stand: 4: Min assist;Without upper extremity assist;From bed;Other (comment) (raised due to bed at home is raised) Stand to Sit: 4: Min assist;With armrests;With upper extremity assist;To chair/3-in-1;Other (comment) (A for descent) Exercises Shoulder Exercises Shoulder Flexion: AROM;Right;5 reps;Seated;Other (comment) (encouraged him to do this 10 times every waking hour) Shoulder ABduction: AROM;Right;5 reps;Seated;Other (comment) (encouraged him to do this 10 times every waking hou) Shoulder External Rotation: AROM;5 reps;Seated;Other (comment) (encouraged him to do this 10 times every waking hou) End of Session  OT - End of Session Equipment Utilized During Treatment: Gait belt Activity Tolerance: Patient tolerated treatment well Patient left: in chair;with family/visitor present;Other (comment) (phone within reach, call bell wife had) General Behavior During Session: [facility] for tasks performed Cognition: [facility] for tasks performed   Evette Georges [phone] 02/21/2011, 11:16 AM

## 2011-02-22 ENCOUNTER — Inpatient Hospital Stay (HOSPITAL_COMMUNITY)
Admission: RE | Admit: 2011-02-22 | Discharge: 2011-03-03 | DRG: 945 | Disposition: A | Discharge status: Home / Self Care | Payer: Medicaid Other | Source: Ambulatory Visit | Attending: Physical Medicine & Rehabilitation | Admitting: Physical Medicine & Rehabilitation

## 2011-02-22 DIAGNOSIS — I639 Cerebral infarction, unspecified: Secondary | ICD-10-CM

## 2011-02-22 DIAGNOSIS — I613 Nontraumatic intracerebral hemorrhage in brain stem: Secondary | ICD-10-CM

## 2011-02-22 DIAGNOSIS — I6330 Cerebral infarction due to thrombosis of unspecified cerebral artery: Secondary | ICD-10-CM

## 2011-02-22 DIAGNOSIS — J4489 Other specified chronic obstructive pulmonary disease: Secondary | ICD-10-CM | POA: Diagnosis present

## 2011-02-22 DIAGNOSIS — I10 Essential (primary) hypertension: Secondary | ICD-10-CM | POA: Diagnosis present

## 2011-02-22 DIAGNOSIS — H532 Diplopia: Secondary | ICD-10-CM | POA: Diagnosis present

## 2011-02-22 DIAGNOSIS — Z5189 Encounter for other specified aftercare: Principal | ICD-10-CM

## 2011-02-22 DIAGNOSIS — I509 Heart failure, unspecified: Secondary | ICD-10-CM | POA: Diagnosis present

## 2011-02-22 DIAGNOSIS — I619 Nontraumatic intracerebral hemorrhage, unspecified: Secondary | ICD-10-CM | POA: Diagnosis present

## 2011-02-22 DIAGNOSIS — F17200 Nicotine dependence, unspecified, uncomplicated: Secondary | ICD-10-CM | POA: Diagnosis present

## 2011-02-22 DIAGNOSIS — J449 Chronic obstructive pulmonary disease, unspecified: Secondary | ICD-10-CM | POA: Diagnosis present

## 2011-02-22 DIAGNOSIS — E876 Hypokalemia: Secondary | ICD-10-CM | POA: Diagnosis present

## 2011-02-22 HISTORY — DX: Cerebral infarction, unspecified: I63.9

## 2011-02-22 MED ORDER — POTASSIUM CHLORIDE CRYS ER 20 MEQ PO TBCR
20.0000 meq | EXTENDED_RELEASE_TABLET | Freq: Every day | ORAL | Status: DC
  Administered 2011-02-23 – 2011-02-24 (×2): 20 meq via ORAL
  Filled 2011-02-22 (×5): qty 1

## 2011-02-22 MED ORDER — ALUM & MAG HYDROXIDE-SIMETH 200-200-20 MG/5ML PO SUSP: 30.0000 mL | ORAL | Status: DC | PRN

## 2011-02-22 MED ORDER — LABETALOL HCL 5 MG/ML IV SOLN: 5.0000 mg | Freq: Once | INTRAVENOUS | Status: DC

## 2011-02-22 MED ORDER — HYDROCODONE-ACETAMINOPHEN 10-325 MG PO TABS
1.0000 | ORAL_TABLET | Freq: Four times a day (QID) | ORAL | Status: DC | PRN
  Administered 2011-02-23 – 2011-02-24 (×2): 1 via ORAL
  Administered 2011-02-25: 2 via ORAL
  Administered 2011-02-26 – 2011-03-02 (×2): 1 via ORAL
  Filled 2011-02-22 (×4): qty 1
  Filled 2011-02-22: qty 2

## 2011-02-22 MED ORDER — PROMETHAZINE HCL 12.5 MG PO TABS: 12.5000 mg | ORAL_TABLET | Freq: Four times a day (QID) | ORAL | Status: DC | PRN

## 2011-02-22 MED ORDER — TRAZODONE HCL 50 MG PO TABS
25.0000 mg | ORAL_TABLET | Freq: Every evening | ORAL | Status: DC | PRN
  Administered 2011-03-02: 50 mg via ORAL
  Filled 2011-02-22: qty 1

## 2011-02-22 MED ORDER — HYDROCHLOROTHIAZIDE 25 MG PO TABS
25.0000 mg | ORAL_TABLET | Freq: Every day | ORAL | Status: DC
  Administered 2011-02-23 – 2011-03-03 (×9): 25 mg via ORAL
  Filled 2011-02-22 (×11): qty 1

## 2011-02-22 MED ORDER — ALUM & MAG HYDROXIDE-SIMETH 400-400-40 MG/5ML PO SUSP: 30.0000 mL | ORAL | Status: DC | PRN

## 2011-02-22 MED ORDER — LISINOPRIL 10 MG PO TABS
10.0000 mg | ORAL_TABLET | Freq: Every day | ORAL | Status: DC
  Administered 2011-02-23: 10 mg via ORAL
  Filled 2011-02-22 (×3): qty 1

## 2011-02-22 MED ORDER — PANTOPRAZOLE SODIUM 40 MG PO TBEC
40.0000 mg | DELAYED_RELEASE_TABLET | Freq: Every day | ORAL | Status: DC
  Administered 2011-02-23 – 2011-03-02 (×8): 40 mg via ORAL
  Filled 2011-02-22 (×8): qty 1

## 2011-02-22 MED ORDER — PROMETHAZINE HCL 12.5 MG RE SUPP: 12.5000 mg | Freq: Four times a day (QID) | RECTAL | Status: DC | PRN

## 2011-02-22 MED ORDER — BISACODYL 10 MG RE SUPP: 10.0000 mg | Freq: Every day | RECTAL | Status: DC | PRN

## 2011-02-22 MED ORDER — GABAPENTIN 400 MG PO CAPS
400.0000 mg | ORAL_CAPSULE | Freq: Three times a day (TID) | ORAL | Status: DC
  Administered 2011-02-22: 400 mg via ORAL
  Filled 2011-02-22 (×5): qty 1

## 2011-02-22 MED ORDER — PNEUMOCOCCAL VAC POLYVALENT 25 MCG/0.5ML IJ INJ
0.5000 mL | INJECTION | Freq: Once | INTRAMUSCULAR | Status: AC
  Administered 2011-02-22: 0.5 mL via INTRAMUSCULAR
  Filled 2011-02-22 (×3): qty 0.5

## 2011-02-22 MED ORDER — NICOTINE 14 MG/24HR TD PT24
14.0000 mg | MEDICATED_PATCH | TRANSDERMAL | Status: DC
  Administered 2011-02-23 – 2011-03-03 (×10): 14 mg via TRANSDERMAL
  Filled 2011-02-22 (×11): qty 1

## 2011-02-22 MED ORDER — DIPHENHYDRAMINE HCL 12.5 MG/5ML PO ELIX: 12.5000 mg | ORAL_SOLUTION | Freq: Four times a day (QID) | ORAL | Status: DC | PRN

## 2011-02-22 MED ORDER — SENNOSIDES-DOCUSATE SODIUM 8.6-50 MG PO TABS
1.0000 | ORAL_TABLET | Freq: Two times a day (BID) | ORAL | Status: DC
  Administered 2011-02-22 – 2011-03-03 (×18): 1 via ORAL
  Filled 2011-02-22 (×18): qty 1

## 2011-02-22 MED ORDER — FLEET ENEMA 7-19 GM/118ML RE ENEM
1.0000 | ENEMA | Freq: Once | RECTAL | Status: AC | PRN
  Filled 2011-02-22: qty 1

## 2011-02-22 MED ORDER — ACETAMINOPHEN 325 MG PO TABS
325.0000 mg | ORAL_TABLET | ORAL | Status: DC | PRN
  Administered 2011-02-23 – 2011-02-25 (×2): 650 mg via ORAL
  Filled 2011-02-22 (×2): qty 2

## 2011-02-22 MED ORDER — PROMETHAZINE HCL 25 MG/ML IJ SOLN: 12.5000 mg | Freq: Four times a day (QID) | INTRAMUSCULAR | Status: DC | PRN

## 2011-02-22 MED ORDER — TRAMADOL HCL 50 MG PO TABS
50.0000 mg | ORAL_TABLET | Freq: Four times a day (QID) | ORAL | Status: DC | PRN
  Administered 2011-02-25: 50 mg via ORAL
  Filled 2011-02-22: qty 1

## 2011-02-22 MED ORDER — GUAIFENESIN-DM 100-10 MG/5ML PO SYRP: 5.0000 mL | ORAL_SOLUTION | Freq: Four times a day (QID) | ORAL | Status: DC | PRN

## 2011-02-22 NOTE — Progress Notes (Signed)
Utilization review completed. Dirck Butch, RN, BSN. 02/22/11  

## 2011-02-22 NOTE — Plan of Care (Signed)
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 Co-morbidities: CHF (congestive heart failure)  COPD (chronic obstructive pulmonary disease)  Hypertension  Smoker    Functional Problem List  Patient demonstrates impairments in the following areas: Balance, Bladder, Bowel, Endurance, Medication Management, Motor, Sensory  and Vision  Basic ADL's: grooming, bathing, dressing and toileting Advanced ADL's: simple meal preparation  Transfers:  bed mobility, bed to chair, toilet, tub/shower, car, furniture and floor Locomotion:  ambulation, wheelchair mobility and stairs  Additional Impairments:  Functional use of right upper extremity  Anticipated Outcomes Item Anticipated Outcome  Eating/Swallowing    Basic self-care  Modified independent-supervision  Tolieting  Mod independent  Bowel/Bladder  mod independent  Transfers  Modified independent  Locomotion  Modified independent  Communication    Cognition    Pain  < 3 1-10 scale  Safety/Judgment  supervision  Other  Skin: no new breakdown, min assist with reposition and skin care management   Therapy Plan: PT Frequency: 2-3 X/day, 60-90 minutesPT: 2-3 times/day, 60-90 min, 5 of 7 days/week OT Frequency: 1-2 X/day, 60-90 minutes     Team Interventions: Item RN PT OT SLP SW TR Other  Self Care/Advanced ADL Retraining  x x      Neuromuscular Re-Education  x x      Therapeutic Activities  x x      UE/LE Strength Training/ROM  x x      UE/LE Coordination Activities  x x      Visual/Perceptual Remediation/Compensation  x x      DME/Adaptive Equipment Instruction  x x      Therapeutic Exercise  x x      Balance/Vestibular Training  x x      Patient/Family Education  x x      Cognitive Remediation/Compensation         Functional Mobility  Training  x x      Ambulation/Gait Training  x       Museum/gallery curator  x       Wheelchair Propulsion/Positioning  x       Functional Tourist information centre manager Reintegration  x x      Dysphagia/Aspiration Film/video editor         Bladder Management x        Bowel Management x        Disease Management/Prevention x        Pain Management x        Medication Management x        Skin Care/Wound Management x        Splinting/Orthotics         Discharge Planning  x   x    Psychosocial Support  x   x                       Team Discharge Planning: Destination:  Home Projected Follow-up:  PT, OT and Outpatient Projected Equipment Needs:  Tub Museum/gallery exhibitions officer, TBD for PT as patient progresses Patient/family involved in discharge planning:  Yes  MD ELOS: 10d Medical Rehab Prognosis:  Excellent Assessment: 61 yo male admitted and worked up for CVA now requiring CIR level PT,OT, Teaching laboratory technician  and daily MD visits.

## 2011-02-22 NOTE — Progress Notes (Signed)
Physical Therapy Treatment Patient Details Name: Jonathon [name] MRN: [id number] DOB: 16-May-1950 Today's Date: 02/22/2011  PT Assessment/Plan  PT - Assessment/Plan Comments on Treatment Session: Pt progressing with PT goals.  Encouragement given to pt to have more confidence in himself & self-awareness of his progression with mobilitiy.   PT Plan: Discharge plan remains appropriate PT Frequency: Min 4X/week Follow Up Recommendations: Inpatient Rehab Equipment Recommended: Defer to next venue PT Goals  Acute Rehab PT Goals PT Goal: Sit to Stand - Progress: Progressing toward goal PT Goal: Stand to Sit - Progress: Progressing toward goal PT Goal: Ambulate - Progress: Progressing toward goal  PT Treatment Precautions/Restrictions  Precautions Precautions: Fall Required Braces or Orthoses: No Restrictions Weight Bearing Restrictions: No Mobility (including Balance) Bed Mobility Bed Mobility: No Transfers Sit to Stand: Other (comment) (Min Guard (A)) Sit to Stand Details (indicate cue type and reason): Cues for hand placement & to ensure equipment in safe position Stand to Sit: 4: Min assist;To chair/3-in-1;With upper extremity assist;With armrests Stand to Sit Details: Assist to control descent.  Cues for hand placement & technique.  Ambulation/Gait Ambulation/Gait Assistance: Other (comment) (Min Guard (A)) Ambulation/Gait Assistance Details (indicate cue type and reason): Cues to stay inside RW, increased hip/knee flexion Rt leg to faciliate increased floor clearance.   Ambulation Distance (Feet): 80 Feet Assistive device: Rolling walker Gait Pattern: Decreased stride length;Decreased step length - right;Decreased step length - left;Decreased hip/knee flexion - right Stairs: No Wheelchair Mobility Wheelchair Mobility: No    Exercise    End of Session PT - End of Session Equipment Utilized During Treatment: Gait belt Activity Tolerance: Patient tolerated treatment  well Patient left: in chair;with call bell in reach;with family/visitor present Nurse Communication: Mobility status for ambulation;Mobility status for transfers General Behavior During Session: [facility] for tasks performed Cognition: [facility] for tasks performed  Lara Mulch 02/22/2011, 3:01 PM   [phone]

## 2011-02-22 NOTE — PMR Pre-admission (Signed)
PMR Admission Coordinator Pre-Admission Assessment  Patient:  Jonathon [name] is an 61 y.o., male MRN:  [id number] DOB:  Jan 08, 1951 Height:  6' (182.9 cm) Weight:  90.6 kg (199 lb 11.8 oz)  Insurance:  [facility] [employer] # [id number]  Eryc Bodey [phone] (aide to families with dependent children)  Eligible 02/21/11 Medicaid to terminate at the end of February due to son child being 1 yrs old.  Current Medical History:   Patient Admitting Diagnosis: L pontine hemorrhage  History of Present Illness: History of HTN and CHF noncompliance with medications.  Admitted 01/31 with right sided weakness and slurred speech, AMS, vomiting.  CT showed acute left brain stem hemorrhage at the pons.  Was placed on Cardene drip.  Dr. Danielle Dess followed for conservative care.  Patients Past Medical History:   Past Medical History  Diagnosis Date  . CHF (congestive heart failure)   . COPD (chronic obstructive pulmonary disease)   . Hypertension   . Smoker    Family Medical History:  family history is not on file. NIH Stroke scale: Total: 3  Glascow Coma Scale:   Patients Current Diet: Cardiac  Prior Rehab/Hospitalizations: Last hospitalization 3 yrs ago for chest pain.  Current Medications: Current facility-administered medications:0.9 %  sodium chloride infusion, , Intravenous, Continuous, Carmell Austria, MD;  acetaminophen (TYLENOL) suppository 650 mg, 650 mg, Rectal, Q4H PRN, Carmell Austria, MD;  acetaminophen (TYLENOL) tablet 650 mg, 650 mg, Oral, Q4H PRN, Carmell Austria, MD, 650 mg at 02/19/11 8657;  gabapentin (NEURONTIN) capsule 400 mg, 400 mg, Oral, TID, Lesly Dukes, MD, 400 mg at 02/22/11 8469 hydrochlorothiazide (HYDRODIURIL) tablet 25 mg, 25 mg, Oral, Daily, Annie Main, NP, 25 mg at 02/22/11 0954;  HYDROcodone-acetaminophen (NORCO) 5-325 MG per tablet 1 tablet, 1 tablet, Oral, Q4H PRN, Lesly Dukes, MD, 1 tablet at 02/20/11 1140;  labetalol (NORMODYNE,TRANDATE)  injection 10-40 mg, 10-40 mg, Intravenous, Q10 min PRN, Carmell Austria, MD, 10 mg at 02/21/11 1934 labetalol (NORMODYNE,TRANDATE) injection 5 mg, 5 mg, Intravenous, Once, Rajani Caesar, MD;  lisinopril (PRINIVIL,ZESTRIL) tablet 10 mg, 10 mg, Oral, Daily, Annie Main, NP, 10 mg at 02/22/11 0955;  lisinopril (PRINIVIL,ZESTRIL) tablet 5 mg, 5 mg, Oral, Once, Annie Main, NP, 5 mg at 02/21/11 1411;  lisinopril (PRINIVIL,ZESTRIL) tablet 5 mg, 5 mg, Oral, Once, Carmelina Peal, MD, 5 mg at 02/21/11 2342 nicotine (NICODERM CQ - dosed in mg/24 hours) patch 14 mg, 14 mg, Transdermal, Q24H, Annie Main, NP, 14 mg at 02/21/11 1410;  ondansetron (ZOFRAN) injection 4 mg, 4 mg, Intravenous, Q6H PRN, Carmell Austria, MD, 4 mg at 02/18/11 0756;  pantoprazole (PROTONIX) EC tablet 40 mg, 40 mg, Oral, Q1200, Severiano Gilbert, PHARMD, 40 mg at 02/21/11 1147 potassium chloride SA (K-DUR,KLOR-CON) CR tablet 20 mEq, 20 mEq, Oral, Daily, Crystal [place], PHARMD, 20 mEq at 02/22/11 6295;  senna-docusate (Senokot-S) tablet 1 tablet, 1 tablet, Oral, BID, Carmell Austria, MD, 1 tablet at 02/22/11 [phone];  DISCONTD: DULoxetine (CYMBALTA) DR capsule 30 mg, 30 mg, Oral, Daily, Lesly Dukes, MD, 30 mg at 02/21/11 1010 DISCONTD: lisinopril (PRINIVIL,ZESTRIL) tablet 5 mg, 5 mg, Oral, Daily, Annie Main, NP, 5 mg at 02/21/11 1010  Additional Precautions/Restrictions: Precautions Precautions: Fall Required Braces or Orthoses: No Restrictions Weight Bearing Restrictions: No  Therapy Assessments Physical Therapy: Precautions Precautions: Fall Required Braces or Orthoses: No Home Living Lives With: Spouse Receives Help From: Family Type of Home: House Home Layout: Multi-level Alternate Level Stairs-Rails: Right Alternate Level Stairs-Number of Steps: one flight  up and another down Home Access: Stairs to enter Entrance Stairs-Rails: Right Entrance Stairs-Number of Steps: 5 Bathroom Shower/Tub: Tub/shower  unit;Curtain Firefighter: Standard Bathroom Accessibility: Yes How Accessible: Accessible via walker Home Adaptive Equipment: None Prior Function Level of Independence: Independent with basic ADLs;Independent with homemaking with ambulation;Independent with transfers;Independent with gait Driving: Yes Vocation: Full time employment Coordination Gross Motor Movements are Fluid and Coordinated: No Fine Motor Movements are Fluid and Coordinated: No Coordination and Movement Description: decreased for RUE gross and fine motor coordination due to decreased strength and painful right shoulder  Occupational Therapy: Precautions Precautions: Fall Required Braces or Orthoses: No Home Living Lives With: Spouse Receives Help From: Family Type of Home: House Home Layout: Multi-level Alternate Level Stairs-Rails: Right Alternate Level Stairs-Number of Steps: one flight up and another down Home Access: Stairs to enter Entrance Stairs-Rails: Right Entrance Stairs-Number of Steps: 5 Bathroom Shower/Tub: Forensic scientist: Standard Bathroom Accessibility: Yes How Accessible: Accessible via walker Home Adaptive Equipment: None Prior Function Level of Independence: Independent with basic ADLs;Independent with homemaking with ambulation;Independent with transfers;Independent with gait Driving: Yes Vocation: Full time employment Coordination Gross Motor Movements are Fluid and Coordinated: No Fine Motor Movements are Fluid and Coordinated: No Coordination and Movement Description: decreased for RUE gross and fine motor coordination due to decreased strength and painful right shoulder Restrictions Weight Bearing Restrictions: No ADL Eating/Feeding: Simulated;Modified independent;Other (comment) (increased time due to decreased use of RUE) Where Assessed - Eating/Feeding: Edge of bed Grooming: Simulated;Supervision/safety;Set up;Other (comment) (increased time due to  decreased use of RUE) Where Assessed - Grooming: Sitting, bed;Unsupported Upper Body Bathing: Simulated;Supervision/safety;Set up;Other (comment) (increased time due to decreased [place] of RUE) Where Assessed - Upper Body Bathing: Unsupported;Sitting, bed Lower Body Bathing: Moderate assistance;Simulated (increased time due to decreased use of RUE, standing balance) Where Assessed - Lower Body Bathing: Sitting, bed;Supported;Other (comment) (raised bed since bed at home is higher) Upper Body Dressing: Simulated;Minimal assistance;Other (comment) (increased time due to decreased use of RUE) Where Assessed - Upper Body Dressing: Unsupported;Sitting, bed Lower Body Dressing: Moderate assistance;Other (comment) (increased time due to decreased use of RUE, standing balance) Where Assessed - Lower Body Dressing: Supported;Sit to stand from bed (raised since bed at home is higher) Toilet Transfer: Simulated;Minimal assistance Toilet Transfer Details (indicate cue type and reason): Bed to chair going to pt's right with two surfaces right next to each other, no more activity than this due to increased BP Toilet Transfer Method:  (stand turn) Toileting - Clothing Manipulation: Simulated;Minimal assistance;Other (comment) (increased time due to decreased use of RUE, standing balance) Where Assessed - Toileting Clothing Manipulation: Standing Toileting - Hygiene: Simulated;Minimal assistance;Other (comment) (increased time due to decreased use of RUE) Where Assessed - Toileting Hygiene: Sit to stand from 3-in-1 or toilet Tub/Shower Transfer: Not assessed Equipment Used:  (none)  SLP Recommendations: Recommendations for Other Services: Rehab consult Follow up Recommendations: None Equipment Recommended: Defer to next venue Recommended Consults: MBS General Recommendation: NPO except meds Solid Consistency: Regular Liquid Consistency: Thin Liquid Administration via: Straw;Cup Medication Administration:  Whole meds with liquid Supervision: Intermittent supervision to cue for compensatory strategies Compensations: Slow rate;Small sips/bites;Check for pocketing Postural Changes and/or Swallow Maneuvers: Seated upright 90 degrees Oral Care Recommendations: Oral care BID Recommendations for Other Services: Rehab consult Follow up Recommendations: None  Prior Function: Level of Independence: Independent with basic ADLs;Independent with homemaking with ambulation;Independent with transfers;Independent with gait Driving: Yes Vocation: Full time employment ADL Eating/Feeding: Simulated;Modified independent;Other (comment) (increased time due  to decreased use of RUE) Where Assessed - Eating/Feeding: Edge of bed Grooming: Simulated;Supervision/safety;Set up;Other (comment) (increased time due to decreased use of RUE) Where Assessed - Grooming: Sitting, bed;Unsupported Upper Body Bathing: Simulated;Supervision/safety;Set up;Other (comment) (increased time due to decreased [place] of RUE) Where Assessed - Upper Body Bathing: Unsupported;Sitting, bed Lower Body Bathing: Moderate assistance;Simulated (increased time due to decreased use of RUE, standing balance) Where Assessed - Lower Body Bathing: Sitting, bed;Supported;Other (comment) (raised bed since bed at home is higher) Upper Body Dressing: Simulated;Minimal assistance;Other (comment) (increased time due to decreased use of RUE) Where Assessed - Upper Body Dressing: Unsupported;Sitting, bed Lower Body Dressing: Moderate assistance;Other (comment) (increased time due to decreased use of RUE, standing balance) Where Assessed - Lower Body Dressing: Supported;Sit to stand from bed (raised since bed at home is higher) Toilet Transfer: Simulated;Minimal assistance Toilet Transfer Details (indicate cue type and reason): Bed to chair going to pt's right with two surfaces right next to each other, no more activity than this due to increased BP Toilet Transfer  Method:  (stand turn) Toileting - Clothing Manipulation: Simulated;Minimal assistance;Other (comment) (increased time due to decreased use of RUE, standing balance) Where Assessed - Toileting Clothing Manipulation: Standing Toileting - Hygiene: Simulated;Minimal assistance;Other (comment) (increased time due to decreased use of RUE) Where Assessed - Toileting Hygiene: Sit to stand from 3-in-1 or toilet Tub/Shower Transfer: Not assessed Equipment Used:  (none)  Additional Prior Functional Levels:  Bed Mobility: I Transfers: I Mobility - Walk/Wheelchair: I Upper Body Dressing: I Lower Body Dressing: I Grooming: I Eating/Drinking: I Toilet Transfer: I Bladder Continence: WNL Bowel Management: WNL Stair Climbing: I Communication: WNL Memory: WNL Cooking/Meal Prep: I Housework: I Money Management: I Driving: yes  Prior Activity Level: Community (5-7x/wk): Worked 30 to 35 hrs a week, very active daily  ADLs/Mobility: ADL Eating/Feeding: Simulated;Modified independent;Other (comment) (increased time due to decreased use of RUE) Where Assessed - Eating/Feeding: Edge of bed Grooming: Simulated;Supervision/safety;Set up;Other (comment) (increased time due to decreased use of RUE) Where Assessed - Grooming: Sitting, bed;Unsupported Upper Body Bathing: Simulated;Supervision/safety;Set up;Other (comment) (increased time due to decreased [place] of RUE) Where Assessed - Upper Body Bathing: Unsupported;Sitting, bed Lower Body Bathing: Moderate assistance;Simulated (increased time due to decreased use of RUE, standing balance) Where Assessed - Lower Body Bathing: Sitting, bed;Supported;Other (comment) (raised bed since bed at home is higher) Upper Body Dressing: Simulated;Minimal assistance;Other (comment) (increased time due to decreased use of RUE) Where Assessed - Upper Body Dressing: Unsupported;Sitting, bed Lower Body Dressing: Moderate assistance;Other (comment) (increased time due to  decreased use of RUE, standing balance) Where Assessed - Lower Body Dressing: Supported;Sit to stand from bed (raised since bed at home is higher) Toilet Transfer: Simulated;Minimal assistance Toilet Transfer Details (indicate cue type and reason): Bed to chair going to pt's right with two surfaces right next to each other, no more activity than this due to increased BP Toilet Transfer Method:  (stand turn) Toileting - Clothing Manipulation: Simulated;Minimal assistance;Other (comment) (increased time due to decreased use of RUE, standing balance) Where Assessed - Toileting Clothing Manipulation: Standing Toileting - Hygiene: Simulated;Minimal assistance;Other (comment) (increased time due to decreased use of RUE) Where Assessed - Toileting Hygiene: Sit to stand from 3-in-1 or toilet Tub/Shower Transfer: Not assessed Equipment Used:  (none)  Bed Mobility Bed Mobility: Yes Rolling Left: 4: Min assist;Other (comment) (max VCs for sequencing) Left Sidelying to Sit: 4: Min assist;HOB flat (max VCs for sequencing) Supine to Sit: 4: Min assist;HOB elevated (Comment degrees) (30 degrees)  Supine to Sit Details (indicate cue type and reason): increased effort on pt's part but only needing minimal tactile cues for follow through and sequencing; pt limiting the use of his RUE and needing cues to actually use it during the transfer Sitting - Scoot to Edge of Bed: 5: Supervision Sitting - Scoot to Edge of Bed Details (indicate cue type and reason): verbal cues to use his RUE to assist Transfers Transfers: Yes Sit to Stand: 4: Min assist;Without upper extremity assist;From bed;Other (comment) (raised due to bed at home is raised) Sit to Stand Details (indicate cue type and reason): pt initially reaching out for therapist and rehab tech to pull him up; with verbal cues to position hands on the bed and minimal facilitation for anterior translation in prep to stand pt able to stand with +2totalpt75% to steady  him and assist with follow through; pt very fearful Stand to Sit: 4: Min assist;With armrests;With upper extremity assist;To chair/3-in-1;Other (comment) (A for descent) Stand to Sit Details: cues for encouragement and hand placement on chair; when pt forced to stand unsupported in prep to sit he lost his footing slightly and teetered to the left requiring assist to steady himself; cues for hands on arm rests and assist to guide him to chair slowly Ambulation/Gait Ambulation/Gait: Yes Ambulation/Gait Assistance: 1: +2 Total assist Ambulation/Gait Assistance Details (indicate cue type and reason): pt amb approx 6 ft with bilateral HHA and facilitation around waist to give pt confidence and assist with weight shift and reciprocal steps; pt with slight ataxic step on right with decreased stance time on right throwinig him off balance during gait; pt very fearful (RR increased to 30s but he does well with calming cues for deep breathing)  Ambulation Distance (Feet): 6 Feet Assistive device: 2 person hand held assist Gait Pattern: Ataxic;Trunk flexed;Decreased stance time - right;Decreased step length - right Stairs: No Wheelchair Mobility Wheelchair Mobility: No Balance Balance Assessed: Yes Static Standing Balance Static Standing - Level of Assistance: 1: +2 Total assist (80%) Static Standing - Comment/# of Minutes: pt needing increased encouragement to look forward and extend through trunk (pt c/o dizziness throughout session that improved with focus on single object in front of him); when he was able to do this he stood very tall but still relied on bilateral HHA to steady himself; without upper extremity assist pt lost balance to left requring minA to correct Dynamic Standing Balance Dynamic Standing - Balance Support: Bilateral upper extremity supported Dynamic Standing - Level of Assistance: 1: +2 Total assist (75%) Dynamic Standing - Balance Activities: Lateral lean/weight shifting Dynamic  Standing - Comments: pt needing manual faciliation at hips for good technique with weight shift as he is fearful to shift to right but able with no knee buckling  Home Assistive Devices/Equipment:  Home Assistive Devices/Equipment Home Assistive Devices/Equipment: None  Discharge Planning:  Living Arrangements: Spouse/significant other Support Systems: Spouse/significant other Do you have any problems obtaining your medications?: No Type of Residence: Private residence Home Care Services: No  Previous Home Environment:  Living Arrangements: Spouse/significant other Support Systems: Spouse/significant other Do you have any problems obtaining your medications?: No Type of Residence: Private residence Home Care Services: No  Discharge Living Setting:  Plans for Discharge Living Setting: Patient's home;House;Lives with (comment) (Lives with wife and 76 yr old Dtr in split level home) Discharge Living Setting Number of Levels: split Discharge Living Setting Number of Steps: 5 Discharge Living Setting is Bedroom on Main Floor?: No (Bedrooms are upstairs 5-6  steps) Discharge Living Setting is Bathroom on Main Floor?: No  Social/Family/Support Systems:  Patient Roles: Spouse;Parent Contact Information: Ahron Hulbert (c) [phone] Gentry Pilson - son (c) [phone]) Anticipated Caregiver: Marylu Lund - wife Anticipated Caregiver's Contact Information: [phone] Ability/Limitations of Caregiver: Wife not working and can Geneticist, molecular Availability: 24/7 Discharge Plan Discussed with Primary Caregiver: Yes Is Caregiver In Agreement with Plan?: Yes Does Caregiver/Family have Issues with Lodging/Transportation while Pt is in Rehab?: No  Goals/Additional Needs:  Patient/Family Goal for Rehab: PT/OT/ST mod I goals (ELOS = 2 weeks) Cultural Considerations: Muslim, speaks [place] and Arabic, from [place] Dietary Needs: Heart Healthy Equipment Needs: TBD Pt/Family Agrees to Admission and willing to  participate: Yes Program Orientation Provided & Reviewed with Pt/Caregiver Including Roles  & Responsibilities: Yes  Preadmission Screen Completed By:  Trish Mage, 02/22/2011 10:57 AM  Patient's condition:  This patient's condition remains as documented in the Consult dated 02/21/11, in which the Rehabilitation Physician determined and documented that the patient's condition is appropriate for intensive rehabilitative care in an inpatient rehabilitation facility.  Preadmission Screen Competed by: Roderic Palau, RN, Time/Date,1102/02/21/13.  Discussed status with Dr. Riley Kill on 02/22/11 at 1104 (time/date) and received telephone approval for admission today.  Admission Coordinator:  Trish Mage, time1104/Date02/05/13

## 2011-02-22 NOTE — Progress Notes (Signed)
Rehab admissions - Evaluated for possible admission.  I spoke with patient and his wife.  Both are in agreement to inpatient rehab.  We do have bed available today and can plan admit to inpatient rehab today.  Pager [phone]

## 2011-02-22 NOTE — H&P (Signed)
Physical Medicine and Rehabilitation Admission H&P  Chief Complaint   Patient presents with   .  Dizziness, slurred speech, right sided weakness   :  HPI: [name] is a 61 year old right-handed male with history of hypertension as well as congestive heart failure with noted noncompliance of medications who was admitted January 31st with right-sided weakness and slurred speech as well as altered mental status with vomiting. Cranial CT scan showed acute left brain stem hemorrhage centered at the pons without mass effect. Blood pressure noted to be elevated at 161/143 and placed on Cardene drip. Echocardiogram with ejection fraction 60% and grade 1 diastolic dysfunction. Carotid Dopplers with no internal carotid artery stenosis. Neurosurgery Dr. Danielle Dess consulted advise conservative care and management of blood pressure. MRI brain done revealing "Left paracentral posterior pontine hemorrhage extends into the inferior aspect of the left mid brain. Surrounding vasogenic edema. Global atrophy, moderate SVD." Patient has had improvement in right sided weakness but has developed neuropathy Right face/ RUE>RLE. Cymbalta added but caused BP elevation. Therapies initiated and patient with slightly ataxic gait with impaired balance and anxiety with mobility.  Review of Systems  Eyes: Positive for blurred vision (right).  Respiratory: Negative for cough and shortness of breath.  Cardiovascular: Negative for chest pain and palpitations.  Gastrointestinal: Positive for heartburn. Negative for abdominal pain and constipation.  Genitourinary: Negative for dysuria and urgency.  Testicular mass?  Neurological: Positive for dizziness, tingling, sensory change (Numbness right hemi-body with dysethesias. "bugs crawling under the skin") and focal weakness. Negative for headaches.  Psychiatric/Behavioral: Positive for memory loss.   Past Medical History   Diagnosis  Date   .  CHF (congestive heart failure)    .   COPD (chronic obstructive pulmonary disease)    .  Hypertension    .  Smoker     Past surgical history:  Right thoracotomy for PTX.  Left IHR  Family history:  HTN: Mother  Social History: Married. Independent and self-employed. He reports that he has been smoking Cigarettes-1PPD. He does not have any smokeless tobacco history on file. His alcohol- whiskey couple of times a week. No illicit drugs. Wife supportive and can assist as needed past discharge.  Allergies: No Known Allergies  Hospital Medications:   .  gabapentin  400 mg  Oral  TID   .  hydrochlorothiazide  25 mg  Oral  Daily   .  labetalol  5 mg  Intravenous  Once   .  lisinopril  10 mg  Oral  Daily   .  lisinopril  5 mg  Oral  Once   .  lisinopril  5 mg  Oral  Once   .  nicotine  14 mg  Transdermal  Q24H   .  pantoprazole  40 mg  Oral  Q1200   .  potassium chloride  20 mEq  Oral  Daily   .  senna-docusate  1 tablet  Oral  BID   .  DISCONTD: DULoxetine  30 mg  Oral  Daily   .  DISCONTD: lisinopril  5 mg  Oral  Daily    No current outpatient prescriptions on file as of 02/22/2011.   Home:  Home Living  Lives With: Spouse  Receives Help From: Family  Type of Home: House  Home Layout: Multi-level  Alternate Level Stairs-Rails: Right  Alternate Level Stairs-Number of Steps: one flight up and another down  Home Access: Stairs to enter  Entrance Stairs-Rails: Right  Entrance Stairs-Number of Steps:  5  Bathroom Shower/Tub: Government social research officer: Yes  How Accessible: Accessible via walker  Home Adaptive Equipment: None  Functional History:  Prior Function  Level of Independence: Independent with basic ADLs;Independent with homemaking with ambulation;Independent with transfers;Independent with gait  Driving: Yes  Vocation: Full time employment  Functional Status:  Mobility:  Bed Mobility  Bed Mobility: Yes  Rolling Left: 4: Min assist;Other (comment) (max VCs for  sequencing)  Left Sidelying to Sit: 4: Min assist;HOB flat (max VCs for sequencing)  Supine to Sit: 4: Min assist;HOB elevated (Comment degrees) (30 degrees)  Supine to Sit Details (indicate cue type and reason): increased effort on pt's part but only needing minimal tactile cues for follow through and sequencing; pt limiting the use of his RUE and needing cues to actually use it during the transfer  Sitting - Scoot to Edge of Bed: 5: Supervision  Sitting - Scoot to Edge of Bed Details (indicate cue type and reason): verbal cues to use his RUE to assist  Transfers  Transfers: Yes  Sit to Stand: 4: Min assist;Without upper extremity assist;From bed;Other (comment) (raised due to bed at home is raised)  Sit to Stand Details (indicate cue type and reason): pt initially reaching out for therapist and rehab tech to pull him up; with verbal cues to position hands on the bed and minimal facilitation for anterior translation in prep to stand pt able to stand with +2totalpt75% to steady him and assist with follow through; pt very fearful  Stand to Sit: 4: Min assist;With armrests;With upper extremity assist;To chair/3-in-1;Other (comment) (A for descent)  Stand to Sit Details: cues for encouragement and hand placement on chair; when pt forced to stand unsupported in prep to sit he lost his footing slightly and teetered to the left requiring assist to steady himself; cues for hands on arm rests and assist to guide him to chair slowly  Ambulation/Gait  Ambulation/Gait: Yes  Ambulation/Gait Assistance: 1: +2 Total assist  Ambulation/Gait Assistance Details (indicate cue type and reason): pt amb approx 6 ft with bilateral HHA and facilitation around waist to give pt confidence and assist with weight shift and reciprocal steps; pt with slight ataxic step on right with decreased stance time on right throwinig him off balance during gait; pt very fearful (RR increased to 30s but he does well with calming cues for  deep breathing)  Ambulation Distance (Feet): 6 Feet  Assistive device: 2 person hand held assist  Gait Pattern: Ataxic;Trunk flexed;Decreased stance time - right;Decreased step length - right  Stairs: No  Wheelchair Mobility  Wheelchair Mobility: No  ADL:  ADL  Eating/Feeding: Simulated;Modified independent;Other (comment) (increased time due to decreased use of RUE)  Where Assessed - Eating/Feeding: Edge of bed  Grooming: Simulated;Supervision/safety;Set up;Other (comment) (increased time due to decreased use of RUE)  Where Assessed - Grooming: Sitting, bed;Unsupported  Upper Body Bathing: Simulated;Supervision/safety;Set up;Other (comment) (increased time due to decreased [place] of RUE)  Where Assessed - Upper Body Bathing: Unsupported;Sitting, bed  Lower Body Bathing: Moderate assistance;Simulated (increased time due to decreased use of RUE, standing balance)  Where Assessed - Lower Body Bathing: Sitting, bed;Supported;Other (comment) (raised bed since bed at home is higher)  Upper Body Dressing: Simulated;Minimal assistance;Other (comment) (increased time due to decreased use of RUE)  Where Assessed - Upper Body Dressing: Unsupported;Sitting, bed  Lower Body Dressing: Moderate assistance;Other (comment) (increased time due to decreased use of RUE, standing balance)  Where Assessed - Lower Body  Dressing: Supported;Sit to stand from bed (raised since bed at home is higher)  Toilet Transfer: Simulated;Minimal assistance  Toilet Transfer Details (indicate cue type and reason): Bed to chair going to pt's right with two surfaces right next to each other, no more activity than this due to increased BP  Toilet Transfer Method: (stand turn)  Toileting - Clothing Manipulation: Simulated;Minimal assistance;Other (comment) (increased time due to decreased use of RUE, standing balance)  Where Assessed - Toileting Clothing Manipulation: Standing  Toileting - Hygiene: Simulated;Minimal assistance;Other  (comment) (increased time due to decreased use of RUE)  Where Assessed - Toileting Hygiene: Sit to stand from 3-in-1 or toilet  Tub/Shower Transfer: Not assessed  Equipment Used: (none)  Cognition:  Cognition  Arousal/Alertness: Awake/alert  Orientation Level: Oriented X4  Cognition  Arousal/Alertness: Awake/alert  Overall Cognitive Status: Impaired  Orientation Level: Oriented X4  Problem Solving: Requires assistance for problem solving  Cognition - Other Comments: Wanted me to help him up OOB by letting him pull up holding onto my arm--I told him that I need to help teach him how to get up out of the bed by himself safely and he agreeed  Blood pressure 171/95, pulse 61, temperature 98.1 F (36.7 C), temperature source Oral, resp. rate 18, height 6' (1.829 m), weight 90.6 kg (199 lb 11.8 oz), SpO2 94.00%.  Physical Exam  Constitutional: He is oriented to person, place, and time and well-developed, well-nourished, and in no distress.  HENT:  Head: Normocephalic and atraumatic.  Eyes: Pupils are equal, round, and reactive to light.  Neck: Normal range of motion. Neck supple.  Cardiovascular: Normal rate and regular rhythm.  Pulmonary/Chest: Effort normal and breath sounds normal.  Abdominal: Soft. Bowel sounds are normal.  Musculoskeletal: Normal range of motion.  Neurological: He is alert and oriented to person, place, and time. A cranial nerve deficit is present. Coordination abnormal.  Blurry vision right improving. Patient with right facial and tongue sensory impairment. Sensation one out of 2 right arm 1+ out of 2 right leg. Tremor the strength grossly 3 to 4/5 with diminished fine motor movement. Right lower extremity grossly 4 minus to 4/5 with some diminishment of fine motor movements. Speech is clear. Cognitively he is intact. Reflexes 1+ throughout.  Skin: Skin is warm and dry.  Psychiatric: Memory, affect and judgment normal.   No results found for this or any previous visit  (from the past 48 hour(s)).  Ct Head Wo Contrast  02/21/2011 *RADIOLOGY REPORT* Clinical Data: Dizziness, nausea CT HEAD WITHOUT CONTRAST Technique: Contiguous axial images were obtained from the base of the skull through the vertex without contrast. Comparison: the previous day's study Findings: Stable 13 mm left pontine hemorrhage. No hydrocephalus. No new acute intracranial bleed, mass, mass effect, or parenchymal edema. Mild atrophy with resultant prominence of ventricles and sulci as before. No midline shift. Patchy nonspecific regions of hypoattenuation in deep and periventricular white matter bilaterally as before. Bone windows reveal no focal lesion. IMPRESSION: 1. Little change in left pontine hemorrhage. No acute/superimposed abnormality. Original Report Authenticated By: Osa Craver, [name]   Post Admission Physician Evaluation:  1. Functional deficits secondary to Hypertensive left pontine hemorrhage. 2. Patient is admitted to receive collaborative, interdisciplinary care between the physiatrist, rehab nursing staff, and therapy team. 3. Patient's level of medical complexity and substantial therapy needs in context of that medical necessity cannot be provided at a lesser intensity of care such as a SNF. 4. Patient has experienced substantial functional loss  from his/her baseline which was documented above under the "Functional History" and "Functional Status" headings. Judging by the patient's diagnosis, physical exam, and functional history, the patient has potential for functional progress which will result in measurable gains while on inpatient rehab. These gains will be of substantial and practical use upon discharge in facilitating mobility and self-care at the household level. 5. Physiatrist will provide 24 hour management of medical needs as well as oversight of the therapy plan/treatment and provide guidance as appropriate regarding the interaction of the two. 6. 24 hour rehab  nursing will assist with bladder management, bowel management, safety, medication administration and patient education and help integrate therapy concepts, techniques,education, etc. 7. PT will assess and treat for: NMR, Lower ext strength, ROM, pain mgt, adaptive equipment, balance and gait. Goals are: modified independent. 8. OT will assess and treat for: UES, NMR, adaptive equipment, ADLs, safety, fxnl mobility. Goals are: modified independent. 9. SLP will assess and treat for: not app 10. Case Management and Social Worker will assess and treat for psychological issues and discharge planning. 11. Team conference will be held weekly to assess progress toward goals and to determine barriers to discharge. 12. Patient will receive at least 3 hours of therapy per day at least 5 days per week. 13. ELOS and Prognosis: 7-10 days excellent Medical Problem List and Plan:  1. DVT Prophylaxis/Anticoagulation: Mechanical: Sequential compression devices, below knee Bilateral lower extremities  2. Pain Management: has had neuropathic pain right hemibody. Neurontin on board but reports minimal effect. ? Trial of lyrica. Will add nortriptyline qhs  3. Mood: some anxiety especially regarding dysesthesias. Ego support offered. Will monitor for now.  4.HTN: Monitor with bid checks. SBP still ranging 160-170 and lisinopril increased today. Monitor effects. Watch renal status with ace and diuretic on board.  5. COPD: No complaints of SOB or cough. No chronic medications.  6. CHF: Continue HCTZ. Monitor for signs of overload. Heart healthy diet. Check weights  7. Hypokalemia: Currently on supplement. ? Dilutional. Recheck in am.  8. Tobacco use: Educated patient stroke risk. Continue nicotine patch. Patient (and wife) smoke and not interested in quitting right now.   Ivory Broad, MD 02/22/11  2200

## 2011-02-22 NOTE — Discharge Summary (Signed)
Stroke Discharge Summary  Patient ID: Jonathon [name]      MRN: [id number]      DOB: 1950-05-02  Date of Admission: 02/17/2011 Date of Discharge: 02/22/2011  Attending Physician:  Darcella Cheshire, MD  Consulting Physician(s):    Faith Rogue, MD (PM&R)  Patient's PCP:  No primary provider on file.  Discharge Diagnoses:  -pontine hemorrhage with associated vasogenic edema, secondary to malignant hypertension. -malignant hypertension  -hypokalemia -leukocytosis -noncompliance with medications  -right hemiparesis secondary to stroke  -Neuropathic pain on the right body secondary to stroke -cigarette smoker -overweight, Body mass index is 27.09 kg/(m^2).  Past Medical History  Diagnosis Date  . CHF (congestive heart failure)   . COPD (chronic obstructive pulmonary disease)   . Hypertension   . Smoker    No past surgical history on file.  Medications to be continued on Rehab   . gabapentin  400 mg Oral TID  . hydrochlorothiazide  25 mg Oral Daily  . labetalol  5 mg Intravenous Once  . lisinopril  10 mg Oral Daily  . lisinopril  5 mg Oral Once  . lisinopril  5 mg Oral Once  . nicotine  14 mg Transdermal Q24H  . pantoprazole  40 mg Oral Q1200  . potassium chloride  20 mEq Oral Daily  . senna-docusate  1 tablet Oral BID  . DISCONTD: DULoxetine  30 mg Oral Daily  . DISCONTD: lisinopril  5 mg Oral Daily   Laboratory Studies CBC    Component Value Date/Time   WBC 12.2* 02/17/2011 1105   RBC 5.22 02/17/2011 1105   HGB 15.1 02/17/2011 1105   HCT 46.3 02/17/2011 1105   PLT 211 02/17/2011 1105   MCV 88.7 02/17/2011 1105   MCH 28.9 02/17/2011 1105   MCHC 32.6 02/17/2011 1105   RDW 14.2 02/17/2011 1105   LYMPHSABS 5.8* 02/17/2011 1105   MONOABS 1.1* 02/17/2011 1105   EOSABS 0.1 02/17/2011 1105   BASOSABS 0.0 02/17/2011 1105   CMP    Component Value Date/Time   NA 138 02/19/2011 0611   K 3.3* 02/19/2011 0611   CL 102 02/19/2011 0611   CO2 22 02/19/2011 0611   GLUCOSE 115*  02/19/2011 0611   BUN 9 02/19/2011 0611   CREATININE 0.83 02/19/2011 0611   CALCIUM 9.4 02/19/2011 0611   PROT 7.7 02/17/2011 1105   ALBUMIN 3.6 02/17/2011 1105   AST 15 02/17/2011 1105   ALT 14 02/17/2011 1105   ALKPHOS 88 02/17/2011 1105   BILITOT 0.4 02/17/2011 1105   GFRNONAA >90 02/19/2011 0611   GFRAA >90 02/19/2011 0611   COAGS Lab Results  Component Value Date   INR 1.01 02/17/2011   INR 1.10 09/24/2009   INR 0.99 09/23/2009   Lipid Panel    Component Value Date/Time   CHOL 133 02/18/2011 0335   TRIG 56 02/18/2011 0335   HDL 50 02/18/2011 0335   CHOLHDL 2.7 02/18/2011 0335   VLDL 11 02/18/2011 0335   LDLCALC 72 02/18/2011 0335   HgbA1C  Lab Results  Component Value Date   HGBA1C 5.7* 02/18/2011   Urine Drug Screen     Component Value Date/Time   LABOPIA NONE DETECTED 02/17/2011 1918   COCAINSCRNUR NONE DETECTED 02/17/2011 1918   LABBENZ NONE DETECTED 02/17/2011 1918   AMPHETMU NONE DETECTED 02/17/2011 1918   THCU NONE DETECTED 02/17/2011 1918   LABBARB NONE DETECTED 02/17/2011 1918    Alcohol Level No results found for this basename: eth  Cardiac enzymes neg  MRSA neg   Significant Diagnostic Studies CT of the brain  02/20/2011 Interval slight increase in size of pontine hemorrhage as previously diagnosed.  02/17/2011 1. 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 No mass effect or significant surrounding edema at this time. 2. Questionable early extension into the left fourth ventricle. Alternatively, this could reflect asymmetric coronoid calcification. 3. No other acute intracranial hemorrhage. Chronic small vessel ischemia suspected.  MRI of the brain  Left paracentral posterior pontine hemorrhage extends into the inferior aspect of the left mid brain. Surrounding vasogenic edema. Mild deformity of the superior margin of the fourth ventricle. This may represent a hypertensive hemorrhage.  MRA of the brain Intracranial atherosclerotic type changes  2D Echocardiogram EF 55-60% with no  source of embolus.  Carotid Doppler No internal carotid artery stenosis bilaterally. Vertebrals with antegrade flow bilaterally.  CXR 1. Cannot exclude early left lower lobe airspace disease. Infection or aspiration. If PA and lateral radiographs are possible, they should be considered. Alternately, short-term radiographic follow-up recommended. 2. Bolus emphysema with lower lobe predominant interstitial thickening.  EKG normal sinus rhythm.   History of Present Illness  Jonathon [name] is an 61 y.o. male who has been noncompliant with his medications and had sudden onset right sided hemiparesis with dysarthria and confusion and vomiting 02/17/2011. NIHSS of 4. t-PA not given due to left pontine hemorrhage. He was considered for the ATACH-2 study but brainstem hemorrhage was an exclusion. He was admitted to the neuro ICU for further evaluation and treatment.   Hospital Course   Pontine hemorrhage with vasogenic edema was secondary to malignant hypertension. He was placed on Cardene for blood pressure control. Hemorrhage remained stable during the first 24 hours, which is the highest risk timeframe for rebleeding. Once he passed a swallow evaluation, he was placed on hydrochlorothiazide 25 mg and lisinopril 5 mg for blood pressure control with good results. Blood pressure slightly creeped up after patient was placed on Cymbalta for right neuropathic pain that develoed secondary to the stroke. Cymbalta was then discontinued. In the meanwhile lisinopril had been increased to 10 mg.    Once the patient was stable off Cardene drip, he was transferred to the floor where he was evaluated physical therapy, occupational therapy and speech therapy. Due to ongoing right hemiparesis and ataxia, all recommend inpatient rehabilitation.His family is supportive. Rehab felt he was a good candidate for them; a bed has been secured for transfer there.  He developed right body neuropathic pain secondary to the stroke  while in the hospital. He was started on Neurontin with minimal results. Cymbalta was added, but as above, it was discontinued due to blood pressure increase  Patient has Stroke risk factors of hypertension, smoking and noncompliance and obesity  Discharge Exam  Blood pressure 171/95, pulse 61, temperature 98.1 F (36.7 C), temperature source Oral, resp. rate 18, height 6' (1.829 m), weight 90.6 kg (199 lb 11.8 oz), SpO2 94.00%. The patient is alert and cooperative at the time of the examination. The speech is somewhat hoarse, not aphasic.  Respiratory examination is clear.  Cardiovascular examination reveals a regular rate and rhythm, no obvious murmurs or rubs noted.  Abdomen reveals good bowel sounds, nontender.  The patient has mild weakness with grip of the right arm, and dysmetria with finger-nose-finger with the right arm and with toe to finger with the right leg.  No ataxia was seen on the left side of body.  No drift is seen with the upper  extremities.  The patient has symmetric reflexes, toes are downgoing bilaterally.  The patient was not ambulated.  Visual fields are full. Slight decrease in the right nasolabial fold is noted.   Discharge Diet  Cardiac thin liquids  Discharge Plan - Disposition:  Transfer to [facility] for ongoing PT, OT and ST - no antiplatelets secondary to hemorrhage - Ongoing risk factor control by Primary Care Physician. - Risk factor recommendations:  Hypertension target range 130-140/70-80 Lipid range - LDL < 100 and checked every 6 months, fasting Smoking cessation weight loss  - Follow-up primary provider in 1 month. Get one if you do not have. - Follow-up with Dr. Delia Heady in 2 months.  Signed Annie Main, AVNP, ANP-BC, [facility] Nurse Practitioner 02/22/2011, 11:56 AM  Dr. Lesia Sago has personally reviewed chart, pertinent data, examined the patient and developed the plan of care.   Lesly Dukes

## 2011-02-22 NOTE — Progress Notes (Signed)
Patient admitted to 4036 from 3000 at 7 with wife. Report received from [place], [place] on 3000. Oriented to unit, rehab schedule, call bell system, safety plan. Safety video viewed. Bed alarm activated. Patient and wife verbalized understanding of safety plan. Will continue with plan of care. Hedy Camara

## 2011-02-22 NOTE — Progress Notes (Signed)
Occupational Therapy Treatment Patient Details Name: JD MCCASTER MRN: [id number] DOB: [birth year] Today's Date: 02/22/2011 14:25-14:52  15sc, 12 ta OT Assessment/Plan OT Assessment/Plan Comments on Treatment Session: Pt still with significant balance impairment and ataxia.  LUE functional use and movement improving, however pt still reporting increased hypersensitivity and tingling throughout the UE.  Instructed pt/family to begin rubbing the UE with a soft fabric or his hand throughout the day to help desensitize it.  Also encouraged performing FM activities to help improve coordination.  Pt also noted with decreased activity tolerance.  Only able to tolerate sitting EOC for 1 to 2 mins before needing rest break..  Recommend continued intensive rehab at  inpatient to reach modified independent/ supervision level for D/C home. OT Plan: Discharge plan remains appropriate OT Frequency: Min 2X/week Recommendations for Other Services: Rehab consult Follow Up Recommendations: Inpatient Rehab Equipment Recommended: Defer to next venue OT Goals ADL Goals ADL Goal: Grooming - Progress: Progressing toward goals ADL Goal: Additional Goal #1 - Progress: Progressing toward goals Arm Goals Arm Goal: Additional Goal #1 - Progress: Progressing toward goals  OT Treatment Precautions/Restrictions  Precautions Precautions: Fall Required Braces or Orthoses: No Restrictions Weight Bearing Restrictions: No   ADL ADL Grooming: Performed;Teeth care;Brushing hair;Minimal assistance Grooming Details (indicate cue type and reason): Pt stood at the sink for grooming Where Assessed - Grooming: Standing at sink Ambulation Related to ADLs: Pt currently constant min assist for mobility.  Attempts to reach out for surfaces to stabilize with his UEs.. ADL Comments: Pt still with decreased dynamic and static standing balance.  Needs min assist for balance during selfcare tasks.  Also with  slightly impaired  coordination in the RUE which makes him not want to use it as much as he should. Mobility  Bed Mobility Bed Mobility: Yes Rolling Left: 5: Supervision;With rail Left Sidelying to Sit: 5: Supervision;With rails;HOB flat Transfers Transfers: Yes Sit to Stand: 4: Min assist;From bed;With upper extremity assist Sit to Stand Details (indicate cue type and reason): Cues for hand placement & to ensure equipment in safe position Stand to Sit: 4: Min assist;To chair/3-in-1;With upper extremity assist;With armrests Stand to Sit Details: Assist to control descent.  Cues for hand placement & technique.  Exercises Other Exercises Other Exercises: Instructed pt on FM in-and coin manipulation exercise to increase dexterity. Other Exercises: Worked on catching and tossing a beach ball to work on groos motor coordination and integration of the right and left UEs.  End of Session OT - End of Session Equipment Utilized During Treatment: Gait belt Activity Tolerance: Patient limited by fatigue Patient left: in chair;with call bell in reach;with family/visitor present General Behavior During Session: [facility] for tasks performed Cognition: [facility] for tasks performed  Arthor Gorter OTR/L 02/22/2011, 4:19 PM Pager number [phone]

## 2011-02-22 NOTE — H&P (Signed)
Physical Medicine and Rehabilitation Admission H&P     Chief Complaint  Patient presents with  . Dizziness, slurred speech, right sided weakness  : HPI:  [name] is a 61 year old right-handed male with history of hypertension as well as congestive heart failure with noted noncompliance of medications who was admitted January 31st with right-sided weakness and slurred speech as well as altered mental status with vomiting. Cranial CT scan showed acute left brain stem hemorrhage centered at the pons without mass effect. Blood pressure noted to be elevated at 161/143 and placed on Cardene drip. Echocardiogram with ejection fraction 60% and grade 1 diastolic dysfunction. Carotid Dopplers with no internal carotid artery stenosis.  Neurosurgery Dr. Danielle Dess consulted advise conservative care and management of blood pressure. MRI brain done revealing "Left paracentral posterior pontine hemorrhage extends into the inferior aspect of the left mid brain. Surrounding vasogenic edema. Global atrophy, moderate SVD." Patient has had improvement in right sided weakness but has developed neuropathy Right face/ RUE>RLE. Cymbalta added but caused  BP elevation. Therapies initiated and patient with slightly ataxic gait with impaired balance and anxiety with mobility.  Review of Systems  Eyes: Positive for blurred vision (right).  Respiratory: Negative for cough and shortness of breath.   Cardiovascular: Negative for chest pain and palpitations.  Gastrointestinal: Positive for heartburn. Negative for abdominal pain and constipation.  Genitourinary: Negative for dysuria and urgency.       Testicular mass?  Neurological: Positive for dizziness, tingling, sensory change (Numbness right hemi-body with dysethesias.  "bugs crawling under the skin") and focal weakness. Negative for headaches.  Psychiatric/Behavioral: Positive for memory loss.   Past Medical History  Diagnosis Date  . CHF (congestive heart failure)     . COPD (chronic obstructive pulmonary disease)   . Hypertension   . Smoker     Past surgical history:  Right thoracotomy for PTX.  Left IHR  Family history:   HTN: Mother  Social History:  Married.  Independent and self-employed. He reports that he has been smoking Cigarettes-1PPD.  He does not have any smokeless tobacco history on file. His alcohol- whiskey couple of times a week.  No illicit drugs. Wife supportive and can assist as needed past discharge.  Allergies: No Known Allergies  Hospital Medications:    . gabapentin  400 mg Oral TID  . hydrochlorothiazide  25 mg Oral Daily  . labetalol  5 mg Intravenous Once  . lisinopril  10 mg Oral Daily  . lisinopril  5 mg Oral Once  . lisinopril  5 mg Oral Once  . nicotine  14 mg Transdermal Q24H  . pantoprazole  40 mg Oral Q1200  . potassium chloride  20 mEq Oral Daily  . senna-docusate  1 tablet Oral BID  . DISCONTD: DULoxetine  30 mg Oral Daily  . DISCONTD: lisinopril  5 mg Oral Daily   No current outpatient prescriptions on file as of 02/22/2011.    Home: Home Living Lives With: Spouse Receives Help From: Family Type of Home: House Home Layout: Multi-level Alternate Level Stairs-Rails: Right Alternate Level Stairs-Number of Steps: one flight up and another down Home Access: Stairs to enter Entrance Stairs-Rails: Right Entrance Stairs-Number of Steps: 5 Bathroom Shower/Tub: Forensic scientist: Standard Bathroom Accessibility: Yes How Accessible: Accessible via walker Home Adaptive Equipment: None   Functional History: Prior Function Level of Independence: Independent with basic ADLs;Independent with homemaking with ambulation;Independent with transfers;Independent with gait Driving: Yes Vocation: Full time employment  Functional Status:  Mobility:  Bed Mobility Bed Mobility: Yes Rolling Left: 4: Min assist;Other (comment) (max VCs for sequencing) Left Sidelying to Sit: 4: Min  assist;HOB flat (max VCs for sequencing) Supine to Sit: 4: Min assist;HOB elevated (Comment degrees) (30 degrees) Supine to Sit Details (indicate cue type and reason): increased effort on pt's part but only needing minimal tactile cues for follow through and sequencing; pt limiting the use of his RUE and needing cues to actually use it during the transfer Sitting - Scoot to Edge of Bed: 5: Supervision Sitting - Scoot to Edge of Bed Details (indicate cue type and reason): verbal cues to use his RUE to assist Transfers Transfers: Yes Sit to Stand: 4: Min assist;Without upper extremity assist;From bed;Other (comment) (raised due to bed at home is raised) Sit to Stand Details (indicate cue type and reason): pt initially reaching out for therapist and rehab tech to pull him up; with verbal cues to position hands on the bed and minimal facilitation for anterior translation in prep to stand pt able to stand with +2totalpt75% to steady him and assist with follow through; pt very fearful Stand to Sit: 4: Min assist;With armrests;With upper extremity assist;To chair/3-in-1;Other (comment) (A for descent) Stand to Sit Details: cues for encouragement and hand placement on chair; when pt forced to stand unsupported in prep to sit he lost his footing slightly and teetered to the left requiring assist to steady himself; cues for hands on arm rests and assist to guide him to chair slowly Ambulation/Gait Ambulation/Gait: Yes Ambulation/Gait Assistance: 1: +2 Total assist Ambulation/Gait Assistance Details (indicate cue type and reason): pt amb approx 6 ft with bilateral HHA and facilitation around waist to give pt confidence and assist with weight shift and reciprocal steps; pt with slight ataxic step on right with decreased stance time on right throwinig him off balance during gait; pt very fearful (RR increased to 30s but he does well with calming cues for deep breathing)  Ambulation Distance (Feet): 6  Feet Assistive device: 2 person hand held assist Gait Pattern: Ataxic;Trunk flexed;Decreased stance time - right;Decreased step length - right Stairs: No Wheelchair Mobility Wheelchair Mobility: No  ADL: ADL Eating/Feeding: Simulated;Modified independent;Other (comment) (increased time due to decreased use of RUE) Where Assessed - Eating/Feeding: Edge of bed Grooming: Simulated;Supervision/safety;Set up;Other (comment) (increased time due to decreased use of RUE) Where Assessed - Grooming: Sitting, bed;Unsupported Upper Body Bathing: Simulated;Supervision/safety;Set up;Other (comment) (increased time due to decreased [place] of RUE) Where Assessed - Upper Body Bathing: Unsupported;Sitting, bed Lower Body Bathing: Moderate assistance;Simulated (increased time due to decreased use of RUE, standing balance) Where Assessed - Lower Body Bathing: Sitting, bed;Supported;Other (comment) (raised bed since bed at home is higher) Upper Body Dressing: Simulated;Minimal assistance;Other (comment) (increased time due to decreased use of RUE) Where Assessed - Upper Body Dressing: Unsupported;Sitting, bed Lower Body Dressing: Moderate assistance;Other (comment) (increased time due to decreased use of RUE, standing balance) Where Assessed - Lower Body Dressing: Supported;Sit to stand from bed (raised since bed at home is higher) Toilet Transfer: Simulated;Minimal assistance Toilet Transfer Details (indicate cue type and reason): Bed to chair going to pt's right with two surfaces right next to each other, no more activity than this due to increased BP Toilet Transfer Method:  (stand turn) Toileting - Clothing Manipulation: Simulated;Minimal assistance;Other (comment) (increased time due to decreased use of RUE, standing balance) Where Assessed - Toileting Clothing Manipulation: Standing Toileting - Hygiene: Simulated;Minimal assistance;Other (comment) (increased time due to decreased use of RUE) Where Assessed -  Toileting Hygiene: Sit to stand from 3-in-1 or toilet Tub/Shower Transfer: Not assessed Equipment Used:  (none)  Cognition: Cognition Arousal/Alertness: Awake/alert Orientation Level: Oriented X4 Cognition Arousal/Alertness: Awake/alert Overall Cognitive Status: Impaired Orientation Level: Oriented X4 Problem Solving: Requires assistance for problem solving Cognition - Other Comments: Wanted me to help him up OOB by letting him pull up holding onto my arm--I told him that I need to help teach him how to get up out of the bed by himself safely and he agreeed     Blood pressure 171/95, pulse 61, temperature 98.1 F (36.7 C), temperature source Oral, resp. rate 18, height 6' (1.829 m), weight 90.6 kg (199 lb 11.8 oz), SpO2 94.00%. Physical Exam  Constitutional: He is oriented to person, place, and time and well-developed, well-nourished, and in no distress.  HENT:  Head: Normocephalic and atraumatic.  Eyes: Pupils are equal, round, and reactive to light.  Neck: Normal range of motion. Neck supple.  Cardiovascular: Normal rate and regular rhythm.   Pulmonary/Chest: Effort normal and breath sounds normal.  Abdominal: Soft. Bowel sounds are normal.  Musculoskeletal: Normal range of motion.  Neurological: He is alert and oriented to person, place, and time. A cranial nerve deficit is present. Coordination abnormal.       Blurry vision right improving.  Patient with right facial and tongue sensory impairment. Sensation one out of 2 right arm 1+ out of 2 right leg. Tremor the strength grossly 3 to 4/5 with diminished fine motor movement. Right lower extremity grossly 4 minus to 4/5 with some diminishment of fine motor movements. Speech is clear. Cognitively he is intact. Reflexes 1+ throughout.  Skin: Skin is warm and dry.  Psychiatric: Memory, affect and judgment normal.    No results found for this or any previous visit (from the past 48 hour(s)). Ct Head Wo Contrast  02/21/2011   *RADIOLOGY REPORT*  Clinical Data: Dizziness, nausea  CT HEAD WITHOUT CONTRAST  Technique:  Contiguous axial images were obtained from the base of the skull through the vertex without contrast.  Comparison:   the previous day's study  Findings: Stable 13 mm left pontine hemorrhage.  No hydrocephalus. No new acute intracranial bleed, mass, mass effect, or parenchymal edema.  Mild atrophy with resultant prominence of ventricles and sulci as before.  No midline shift.  Patchy nonspecific regions of hypoattenuation in deep and periventricular white matter bilaterally as before.  Bone windows reveal no focal lesion.  IMPRESSION:  1.  Little change in left pontine hemorrhage. No acute/superimposed abnormality.  Original Report Authenticated By: Osa Craver, [name]    Post Admission Physician Evaluation: 1. Functional deficits secondary  to Hypertensive left pontine hemorrhage. 2. Patient is admitted to receive collaborative, interdisciplinary care between the physiatrist, rehab nursing staff, and therapy team. 3. Patient's level of medical complexity and substantial therapy needs in context of that medical necessity cannot be provided at a lesser intensity of care such as a SNF. 4. Patient has experienced substantial functional loss from his/her baseline which was documented above under the "Functional History" and "Functional Status" headings.  Judging by the patient's diagnosis, physical exam, and functional history, the patient has potential for functional progress which will result in measurable gains while on inpatient rehab.  These gains will be of substantial and practical use upon discharge  in facilitating mobility and self-care at the household level. 5. Physiatrist will provide 24 hour management of medical needs as well as oversight of the therapy plan/treatment and provide  guidance as appropriate regarding the interaction of the two. 6. 24 hour rehab nursing will assist with bladder  management, bowel management, safety, medication administration and patient education  and help integrate therapy concepts, techniques,education, etc. 7. PT will assess and treat for:  NMR, Lower ext strength, ROM, pain mgt, adaptive equipment, balance and gait.  Goals are: modified independent. 8. OT will assess and treat for: UES, NMR, adaptive equipment, ADLs, safety, fxnl mobility.   Goals are: modified independent. 9. SLP will assess and treat for: not app 10. Case Management and Social Worker will assess and treat for psychological issues and discharge planning. 11. Team conference will be held weekly to assess progress toward goals and to determine barriers to discharge. 12.  Patient will receive at least 3 hours of therapy per day at least 5 days per week. 13. ELOS and Prognosis: 7-10 days excellent   Medical Problem List and Plan:  1. DVT Prophylaxis/Anticoagulation: Mechanical: Sequential compression devices, below knee Bilateral lower extremities  2. Pain Management: has had neuropathic pain right hemibody. Neurontin on board but reports minimal effect. ? Trial of lyrica.  Will add nortriptyline qhs  3. Mood: some anxiety especially regarding dysesthesias.  Ego support offered.  Will monitor for now.  4.HTN: Monitor with bid checks. SBP still ranging 160-170 and lisinopril increased today.  Monitor effects.   Watch renal status with ace and diuretic on board.  5. COPD:  No complaints of SOB or cough.  No chronic medications.  6. CHF: Continue HCTZ.  Monitor for signs of overload.  Heart healthy diet. Check weights  7. Hypokalemia:  Currently on supplement. ? Dilutional.  Recheck in am.  8. Tobacco use:  Educated patient stroke risk. Continue nicotine patch.  Patient (and wife) smoke and not interested in quitting right now.  Ivory Broad, MD 02/22/2011, 11:12 AM

## 2011-02-22 NOTE — Progress Notes (Signed)
Stroke Team Progress Note  HISTORY Jonathon [name] is an 61 y.o. male who has been noncompliant with his medications and had sudden onset right sided hemiparesis with dysarthria and confusion and vomiting 02/17/2011. NIHSS of 4. t-PA not given due to left pontine hemorrhage. he was admitted to the neuro ICU for further evaluation and treatment.He was considered for the ATACH-2 study but brainstem hemorrhage was an exclusion.  SUBJECTIVE His wife is at the bedside and multiple family members. Medically ready for discharge to rehab; bed available.   OBJECTIVE Filed Vitals:   02/21/11 2050 02/21/11 2219 02/22/11 0040 02/22/11 0555  BP: 169/84 173/88 168/81 161/85  Pulse: 56 58 61 70  Temp:  98.3 F (36.8 C)  98.7 F (37.1 C)  TempSrc:  Oral    Resp:  20  22  Height:      Weight:      SpO2:  94%  96%   CBG (last 3)  No results found for this basename: GLUCAP:3 in the last 72 hours Intake/Output from previous day:   IV Fluid Intake:     . sodium chloride Stopped (02/18/11 0900)   Medications    . DULoxetine  30 mg Oral Daily  . gabapentin  400 mg Oral TID  . hydrochlorothiazide  25 mg Oral Daily  . labetalol  5 mg Intravenous Once  . lisinopril  10 mg Oral Daily  . lisinopril  5 mg Oral Once  . lisinopril  5 mg Oral Once  . nicotine  14 mg Transdermal Q24H  . pantoprazole  40 mg Oral Q1200  . potassium chloride  20 mEq Oral Daily  . senna-docusate  1 tablet Oral BID  . DISCONTD: lisinopril  5 mg Oral Daily  PRN acetaminophen, acetaminophen, HYDROcodone-acetaminophen, labetalol, ondansetron (ZOFRAN) IV  Diet:  Cardiac  Activity:  Up with assistance DVT Prophylaxis:  SCDs   Significant Diagnostic Studies: CBC    Component Value Date/Time   WBC 12.2* 02/17/2011 1105   RBC 5.22 02/17/2011 1105   HGB 15.1 02/17/2011 1105   HCT 46.3 02/17/2011 1105   PLT 211 02/17/2011 1105   MCV 88.7 02/17/2011 1105   MCH 28.9 02/17/2011 1105   MCHC 32.6 02/17/2011 1105   RDW 14.2  02/17/2011 1105   LYMPHSABS 5.8* 02/17/2011 1105   MONOABS 1.1* 02/17/2011 1105   EOSABS 0.1 02/17/2011 1105   BASOSABS 0.0 02/17/2011 1105   CMP    Component Value Date/Time   NA 138 02/19/2011 0611   K 3.3* 02/19/2011 0611   CL 102 02/19/2011 0611   CO2 22 02/19/2011 0611   GLUCOSE 115* 02/19/2011 0611   BUN 9 02/19/2011 0611   CREATININE 0.83 02/19/2011 0611   CALCIUM 9.4 02/19/2011 0611   PROT 7.7 02/17/2011 1105   ALBUMIN 3.6 02/17/2011 1105   AST 15 02/17/2011 1105   ALT 14 02/17/2011 1105   ALKPHOS 88 02/17/2011 1105   BILITOT 0.4 02/17/2011 1105   GFRNONAA >90 02/19/2011 0611   GFRAA >90 02/19/2011 0611   COAGS Lab Results  Component Value Date   INR 1.01 02/17/2011   INR 1.10 09/24/2009   INR 0.99 09/23/2009   Lipid Panel    Component Value Date/Time   CHOL 133 02/18/2011 0335   TRIG 56 02/18/2011 0335   HDL 50 02/18/2011 0335   CHOLHDL 2.7 02/18/2011 0335   VLDL 11 02/18/2011 0335   LDLCALC 72 02/18/2011 0335   HgbA1C  Lab Results  Component Value Date  HGBA1C 5.7* 02/18/2011   Urine Drug Screen    Component Value Date/Time   LABOPIA NONE DETECTED 02/17/2011 1918   COCAINSCRNUR NONE DETECTED 02/17/2011 1918   LABBENZ NONE DETECTED 02/17/2011 1918   AMPHETMU NONE DETECTED 02/17/2011 1918   THCU NONE DETECTED 02/17/2011 1918   LABBARB NONE DETECTED 02/17/2011 1918    Alcohol Level No results found for this basename: eth  Cardiac enzymes neg MRSA neg   CT of the brain   02/20/2011    Interval slight increase in size of pontine hemorrhage as previously diagnosed. 02/17/2011   1.  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  No mass effect or significant surrounding edema at this time. 2.  Questionable early extension into the left fourth ventricle. Alternatively, this could reflect asymmetric coronoid calcification. 3.  No other acute intracranial hemorrhage.  Chronic small vessel ischemia suspected.    MRI of the brain  Left paracentral posterior pontine hemorrhage extends into the    inferior aspect of the left mid brain. Surrounding vasogenic  edema. Mild deformity of the superior margin of the fourth  ventricle. This may represent a hypertensive hemorrhage.   MRA of the brain  Intracranial atherosclerotic type changes   2D Echocardiogram  EF 55-60% with no source of embolus.   Carotid Doppler  No internal carotid artery stenosis bilaterally. Vertebrals with antegrade flow bilaterally.   CXR   1.  Cannot exclude early left lower lobe airspace disease. Infection or aspiration.  If PA and lateral radiographs are possible, they should be considered.  Alternately, short-term radiographic follow-up recommended. 2.  Bolus emphysema with lower lobe predominant interstitial thickening.   EKG  normal sinus rhythm.   Physical Exam    The patient is alert and cooperative at the time of the examination. The speech is somewhat hoarse, not aphasic.  Respiratory examination is clear.  Cardiovascular examination reveals a regular rate and rhythm, no obvious murmurs or rubs noted.  Abdomen reveals good bowel sounds, nontender.  The patient has mild weakness with grip of the right arm, and dysmetria with finger-nose-finger with the right arm and with toe to finger with the right leg.  No ataxia was seen on the left side of body.  No drift is seen with the upper extremities.  The patient has symmetric reflexes, toes are downgoing bilaterally.  The patient was not ambulated.  Visual fields are full. Slight decrease in the right nasolabial fold is noted.  ASSESSMENT [name] is a 61 y.o. male with a pontine hemorrhage with vasogenic edemia, secondary to malignant hypertension. BP remains high.  -hypertension -hypokalemia, 3.3, on potassium supplement -leukocytosis, 12.2 -noncompliance with medications -right hemiparesis secondary to stroke -Neuropathic pain on the right body,  gabapentin increased, Cymbalta added -cigarette smoker, added nicotine  patch -new cardura over weekend, likely associated with increased BP  Hospital day # 5  TREATMENT/PLAN -stop cymbalta -monitor BP. May need to decrease lisinopril -discharge to rehab  Annie Main, AVNP, ANP-BC, GNP-BC [facility] Pager: [phone] 02/22/2011 9:44 AM  Dr. Lesia Sago has personally reviewed chart, pertinent data, examined the patient and developed the plan of care.   Lesly Dukes

## 2011-02-23 DIAGNOSIS — Z5189 Encounter for other specified aftercare: Secondary | ICD-10-CM

## 2011-02-23 DIAGNOSIS — I613 Nontraumatic intracerebral hemorrhage in brain stem: Secondary | ICD-10-CM

## 2011-02-23 DIAGNOSIS — I6330 Cerebral infarction due to thrombosis of unspecified cerebral artery: Secondary | ICD-10-CM

## 2011-02-23 MED ORDER — CLONIDINE HCL 0.1 MG PO TABS
0.1000 mg | ORAL_TABLET | Freq: Four times a day (QID) | ORAL | Status: DC | PRN
  Administered 2011-02-23 – 2011-03-02 (×2): 0.1 mg via ORAL
  Filled 2011-02-23 (×2): qty 1

## 2011-02-23 MED ORDER — GABAPENTIN 300 MG PO CAPS
600.0000 mg | ORAL_CAPSULE | Freq: Three times a day (TID) | ORAL | Status: DC
  Administered 2011-02-23 – 2011-02-24 (×6): 600 mg via ORAL
  Filled 2011-02-23 (×10): qty 2

## 2011-02-23 MED ORDER — LISINOPRIL 10 MG PO TABS
10.0000 mg | ORAL_TABLET | Freq: Two times a day (BID) | ORAL | Status: DC
  Administered 2011-02-23 – 2011-03-02 (×14): 10 mg via ORAL
  Filled 2011-02-23 (×16): qty 1

## 2011-02-23 NOTE — Evaluation (Signed)
Occupational Therapy Assessment and Plan  Patient Details  Name: PABLO STAUFFER MRN: [id number] Date of Birth: [birth year]  OT Diagnosis: abnormal posture, ataxia, disturbance of vision and hemiplegia affecting dominant side Rehab Potential: Rehab Potential: Good ELOS: 7-10 days   Today's Date: 02/23/2011 Time: 1000-1115 Time Calculation (min): 75 min  Assessment & Plan Clinical Impression: Mr. Nils Pyle is a 61 year old right-handed male with history of hypertension as well as congestive heart failure with noted noncompliance of medications who was admitted January 31st with right-sided weakness and slurred speech as well as altered mental status with vomiting. Cranial CT scan showed acute left brain stem hemorrhage centered at the pons without mass effect. Blood pressure noted to be elevated at 161/143 and placed on Cardene drip. Echocardiogram with ejection fraction 60% and grade 1 diastolic dysfunction. Carotid Dopplers with no internal carotid artery stenosis. Neurosurgery Dr. Danielle Dess consulted advise conservative care and management of blood pressure. MRI brain done revealing "Left paracentral posterior pontine hemorrhage extends into the inferior aspect of the left mid brain. Surrounding vasogenic edema. Global atrophy, moderate SVD." Patient has had improvement in right sided weakness but has developed neuropathy Right face/ RUE>RLE. Patient transferred to CIR on 02/22/2011 .   Patient currently requires min to mod assist with basic self-care skills and IADL secondary to muscle weakness, decreased cardiorespiratoy endurance, ataxia and decreased coordination, decreased visual acuity and decreased standing balance, decreased postural control and decreased balance strategies.  Prior to hospitalization, patient could complete BADL/IADL tasks with independence.  Patient will benefit from skilled intervention to increase independence with basic self-care skills and increase level of  independence with iADL prior to discharge home with care partner.  Anticipate patient will require intermittent supervision and follow up home health verses outpatient.  OT - End of Session Activity Tolerance: Tolerates 30+ min activity with multiple rests Endurance Deficit: Yes  OT Assessment Rehab Potential: Good Barriers to Discharge: None OT Plan OT Frequency: 1-2 X/day, 60-90 minutes Estimated Length of Stay: 7-10 days OT Treatment/Interventions: Balance/vestibular training;DME/adaptive equipment instruction;Community reintegration;Functional mobility training;Self Care/advanced ADL retraining;Patient/family education;Neuromuscular re-education;Therapeutic Activities;Therapeutic Exercise;UE/LE Strength taining/ROM;Visual/perceptual remediation/compensation;UE/LE Coordination activities OT Recommendation Follow Up Recommendations: Home health OT verses Outpatient OT Equipment Recommended: Tub/shower seat (TBD as patient progresses)  Precautions/Restrictions  Precautions: Fall Precaution Comments: right sided ataxia and paresthesias Required Braces or Orthoses: No Weight Bearing Restrictions: No Other Position/Activity Restrictions: monitor BP, no strenuous activity if systolic > 165 and diastolic > 100  Pain Patient reports occasional RLE pain only with lower body bath & dressing when trying to reach foot, not rated, repositioned, declined medication.  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 Leisure: Hobbies-yes (Comment) Comments: enjoys spending time with his 6 young grandchildren ADL ADL Eating: Set up Grooming: Minimal assistance Upper Body Bathing: Supervision/safety Lower Body Bathing: Minimal assistance Upper Body Dressing: Setup Lower Body Dressing: Minimal assistance Toileting: Minimal assistance Toilet Transfer: Minimal assistance Toilet Transfer Method: Stand pivot Toilet Transfer Equipment: Raised toilet seat;Grab bars Film/video editor: Moderate assistance Film/video editor Method: Warden/ranger: Transfer tub bench;Grab bars ADL Comments: Verbal cues  to sit down to doff/donn pants instead of stand on one leg. Vision/Perception  Vision - History Baseline Vision: Wears glasses all the time (sometimes not for reading) Patient Visual Report: Diplopia;Blurring of vision (occasionally occurs in superior R/L fields, glasses help) Vision - Assessment Eye Alignment: Within Functional Limits Vision Assessment: Vision not tested formally Visual Fields: No apparent deficits Perception: Within Functional Limits Praxis: Intact  Cognition Overall Cognitive Status: Appears within functional limits for tasks assessed (pt states hard to stay focused and to concentrate) Arousal/Alertness: Awake/alert Orientation Level: Oriented X4 Attention: Selective Selective Attention: Appears intact Memory: Appears intact Awareness: Appears intact Problem Solving: Appears intact Safety/Judgment: Appears intact Sensation Sensation Light Touch: Appears Intact Light Touch Impaired Details: Impaired RUE Stereognosis: Not tested Hot/Cold: Not tested Proprioception: Impaired Detail Proprioception Impaired Details: Impaired RLE Additional Comments: right side "more numb" Coordination Gross Motor Movements are Fluid and Coordinated: No Fine Motor Movements are Fluid and  Coordinated: No Coordination and Movement Description: right LE dysmetria and ataxia during open chain mobility Finger Nose Finger Test: slow, decreased accuarcy, and decreased rhythm  Motor  Motor: Ataxia;Abnormal postural alignment and control Motor - Skilled Clinical Observations: slow and deliberate movements  Mobility  Transfers Sit to Stand: 3: Mod assist Stand to Sit: 4: Min assist  Trunk/Postural Assessment  Postural Control: Deficits on evaluation Trunk Control: mild ataxia at trunk with open chain movement Righting Reactions: delayed  Balance Balance Assessed: Yes Static Sitting Balance Static Sitting - Balance Support: No upper extremity supported;Feet supported Static Sitting - Level of Assistance: 7: Independent Dynamic Sitting Balance Dynamic Sitting - Balance Support: Feet supported;During functional activity Dynamic Sitting - Level of Assistance: 5: Stand by assistance Static Standing Balance Static Standing - Balance Support: No upper extremity supported Static Standing - Level of Assistance: 4: Min assist Dynamic Standing Balance Dynamic Standing - Balance Support: No upper extremity supported Dynamic Standing - Level of Assistance: 3: Mod assist Dynamic Standing - Balance Activities: Lateral lean/weight shifting;Forward lean/weight shifting;Reaching across midline Extremity/Trunk Assessment RUE Assessment: Exceptions to [facility] (lump noted in deltoid, no pain reported) RUE Overall AROM Comments: 0~150 degrees shoulder flexion and abduction without pain, WFL below shoulder  RUE Overall PROM Comments: shoulder - grossly WFL RUE Overall Strength Comments: 3+/5 shoulder, below shoulder 4/5 except decreased grip strength LUE Assessment LUE Assessment: Within Functional Limits  See Care Plan for Long Term Goals  Recommendations for other services: None  Discharge Criteria: Patient will be discharged from OT if patient refuses treatment 3 consecutive times without  medical reason, if treatment goals not met, if there is a change in medical status, if patient makes no progress towards goals or if patient is discharged from hospital.  The above assessment, treatment plan, treatment alternatives and goals were discussed and mutually agreed upon: by patient  Chyrel Taha 02/23/2011, 3:37 PM

## 2011-02-23 NOTE — Progress Notes (Addendum)
[facility] Individual Statement of Services  Patient Name:  Jonathon [name]  Date:  02/23/2011  Welcome to the [facility].  Our goal is to provide you with an individualized program based on your diagnosis and situation, designed to meet your specific needs.  With this comprehensive rehabilitation program, you will be expected to participate in at least 3 hours of rehabilitation therapies Monday-Friday, with modified therapy programming on the weekends.  Your rehabilitation program will include the following services:  Physical Therapy (PT), Occupational Therapy (OT), Speech Therapy (ST), 24 hour per day rehabilitation nursing, Therapeutic Recreaction (TR), Case Management (RN and Child psychotherapist), Rehabilitation Medicine, Nutrition Services and Pharmacy Services  Weekly team conferences will be held on Wednesdays to discuss your progress.  Your RN Case Designer, television/film set will talk with you frequently to get your input and to update you on team discussions.  Team conferences with you and your family in attendance may also be held.  Target discharge date:03/04/11 Overall predicted outcome: Supervision - modified independent  Depending on your progress and recovery, your program may change.  Your RN Case Estate agent will coordinate services and will keep you informed of any changes.  Your RN Sports coach and SW names and contact numbers are listed  below.  The following services may also be recommended but are not provided by the [facility]:   Driving Evaluations  Home Health Rehabiltiation Services  Outpatient Rehabilitatation [facility]  Vocational Rehabilitation   Arrangements will be made to provide these services after discharge if needed.  Arrangements include referral to agencies that provide these services.  Your insurance has been verified to be:  Medicaid Your primary doctor is:  Dr Tracey Harries  Pertinent information will be shared with your doctor and your insurance company.  Case Manager: Lutricia Horsfall, [facility] [phone]  Social Worker:  Dossie Der, [place] [phone]  Information discussed with and copy given to patient by: Meryl Dare, 02/23/2011

## 2011-02-23 NOTE — Progress Notes (Signed)
Recreational Therapy Assessment and Plan  Patient Details  Name: Jonathon [name] MRN: [id number] Date of Birth: [birth year]  Rehab Potential: Good ELOS: 10 days   Assessment Clinical Impression: Pt presents with decreased activity tolerance, decreased functional mobility, decreased balance, right sided weakness limiting pt's independence with leisure/community pursuits.  Plan to focus on community reintegration skills and energy conservation during ELOS.   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) Other Leisure Interests: Insurance account manager for Education?: Yes Does patient have pets?: Yes (2 dogs & a rooster outside) Social interaction - Mood/Behavior: Cooperative Recreational Therapy Orientation Orientation -Reviewed with patient: Available activity resources Strengths/Weaknesses Patient Strengths/Abilities: Active premorbidly Patient weaknesses: Physical limitations  Plan Rec Therapy Plan Is patient appropriate for Therapeutic Recreation?: Yes Rehab Potential: Good Treatment times per week: min 1 time >20 minutes Estimated Length of Stay: 10 days Therapy Goals Achieved By:: Recreation/leisure participation;Community reintegration/education;1:1 session;Adaptive equipment instruction;Patient/family education  Recommendations for other services: None  Discharge Criteria: Patient will be discharged from TR if patient refuses treatment 3 consecutive times without medical reason.  If treatment goals not met, if there is a change in medical status, if patient makes no progress towards goals or if patient is discharged from hospital.  The above assessment, treatment plan, treatment alternatives and goals were discussed and mutually agreed upon: by  patient  Jonathon [name] 02/23/2011, 10:01 AM

## 2011-02-23 NOTE — Progress Notes (Signed)
Physical Therapy Session Note  Patient Details  Name: Jonathon [name] MRN: [id number] Date of Birth: [birth year]  Today's Date: 02/23/2011 Time: 1530-1600 Time Calculation (min): 30 min  Precautions: Precautions Precautions: Fall Precaution Comments: right sided ataxia and paresthesias Required Braces or Orthoses: No Restrictions Weight Bearing Restrictions: No Other Position/Activity Restrictions: monitor BP, no strenuous activity is systolic > 165 and diastolic > 100  Short Term Goals: PT Short Term Goal 1: same as LTGs due to short length of stay PT Short Term Goal 1 - Progress: Progressing toward goal  Skilled Therapeutic Interventions/Progress Updates:     General Chart Reviewed: Yes Amount of Missed PT Time (min): 45 Minutes (declined due to fatigue) Family/Caregiver Present: Yes (wife)   Pain Pain Assessment Pain Assessment: No/denies pain Pain Score: 0-No pain  Other Treatments  Patient supine in bed upon entry, stating he is exhausted and cannot get out of bed. Wife at bedside. Encouragement and education provided re: benefits of activity and role of PT. Patient and wife had questions about causes of post-stroke fatigue. Education provided on the healing process of the brain as well as some medication side effects that may also cause fatigue. Patient stated he does not consistently take blood pressure medications at home and feels the medications given for his blood pressure today may be causing some of his fatigue. Education about importance of taking blood pressure medication, especially following acute CVA, to maintain blood pressure in a good range as defined by his medical doctor to help in preventing another stroke especially now that he has had a stroke he is at higher risk of another one. Patient and wife verbalized understanding. Referred them to medical staff for further questions about medications and medication management. Patient continued to decline OOB with  PT. Will follow up tomorrow.  Therapy/Group: Individual Therapy  Romeo Rabon 02/23/2011, 4:40 PM

## 2011-02-23 NOTE — Evaluation (Signed)
Physical Therapy Assessment and Plan  Patient Details  Name: Jonathon [name] MRN: [id number] Date of Birth: 1950-08-25  PT Diagnosis: Abnormal posture, Abnormality of gait, Ataxia, Impaired sensation and Muscle weakness Rehab Potential: Good ELOS: 7-10 days   Today's Date: 02/23/2011 Time: 7829-5621 Time Calculation (min): 55 min  Assessment & Plan Clinical Impression:  Patient is a 61 y.o male with history of HTN, CHF, COPD, EF 60% with grade 1 diastolic dysfunction, + PPD smoker, and noncompliance of medications who was admitted to the hospital on 02/17/11 with right-sided weakness and slurred speech as well as altered mental status with vomiting. Cranial CT scan showed acute left brain stem hemorrhage centered at the pons without mass effect. MRI brain showed left paracentral posterior pontine hemorrhage extends into the inferior aspect of the left mid brain, surrounding vasogenic edema, global atrophy, and moderate SVD. Patient was admitted to [facility] 02/22/11 for continued therapies.   Prior to hospitalization, patient was independent with all mobility, ADLs, IADLs, and was self-employed full time for moving company. Patient lives with wife in 2-level home with 5 steps to enter and 15 steps to access second floor when bedroom and full bathroom are located.  Patient currently requires mod assist with mobility and has BERG balance score of 21/56 = 100% risk of fall secondary to impaired timing and sequencing of muscle activation, ataxia of trunk and right LE, right LE dysmetria, decreased graded weight shifts in all planes, delayed balance reactions and reaction time to LOB, and significantly decreased activity tolerance resulting in decreased functional independence. Patient will benefit from skilled inpatient PT to maximize safe functional mobility, minimize fall risk and decrease caregiver burden for planned discharge home at anticipated modified independent level.  Anticipate patient will  benefit from follow up OP at discharge.  PT - End of Session Activity Tolerance: Tolerates < 10 min activity, no significant change in vital signs Endurance Deficit: Yes Endurance Deficit Description: 6 seated rest breaks during BERG balance assessment PT Assessment Rehab Potential: Good Barriers to Discharge: None PT Plan PT Frequency: 2-3 X/day, 60-90 minutes Estimated Length of Stay: 7-10 days PT Treatment/Interventions: Ambulation/gait training;Balance/vestibular training;DME/adaptive equipment instruction;Community reintegration;Functional mobility training;Neuromuscular re-education;Patient/family education;Therapeutic Exercise;Therapeutic Activities;Stair training;UE/LE Strength taining/ROM;UE/LE Coordination activities;Visual/perceptual remediation/compensation;Wheelchair propulsion/positioning PT Recommendation Follow Up Recommendations: Outpatient PT Equipment Recommended:  (TBD as patient progresses)  Precautions/Restrictions Precautions Precautions: Fall Precaution Comments: right sided ataxia and paresthesias Required Braces or Orthoses: No Restrictions Weight Bearing Restrictions: No Other Position/Activity Restrictions: monitor BP, no strenuous activity if systolic > 165 and diastolic > 100 General Chart reviewed, wife present for first 15 min of session. Vital Signs Therapy Vitals Pulse Rate: 70  BP: 164/97 mmHg Patient Position, if appropriate: Sitting Oxygen Therapy SpO2: 96 % O2 Device: None (Room air) Pulse Oximetry Type: Intermittent Pain Pain Assessment Pain Assessment: No/denies pain Pain Score: 0-No pain Home Living/Prior Functioning Home Living Lives With: Spouse Receives Help From: Family Type of Home: House Home Layout: Two level Alternate Level Stairs-Rails: Right Alternate Level Stairs-Number of Steps: 15 Home Access: Stairs to enter Entrance Stairs-Rails: Doctor, general practice of Steps: 5 Bathroom Shower/Tub: Investment banker, corporate: Standard Bathroom Accessibility: Yes How Accessible: Accessible via walker Home Adaptive Equipment: None Prior Function Level of Independence: Independent with basic ADLs;Independent with homemaking with ambulation;Independent with gait;Independent with transfers Able to Take Stairs?: Yes Driving: Yes Vocation: Full time employment Vocation Requirements: was self-employed full time for moving copany, drove to the office daily Leisure: Hobbies-yes (Comment) Comments: enjoys spending time  with his 6 young grandchildren Vision/Perception  Vision - History Baseline Vision: Wears glasses for distance only Patient Visual Report: No change from baseline Vision - Assessment Eye Alignment: Within Functional Limits Ocular Range of Motion: Within Functional Limits Alignment/Gaze Preference: Within Defined Limits Visual Fields: No apparent deficits Perception Perception: Within Functional Limits  Cognition Overall Cognitive Status: Appears within functional limits for tasks assessed Arousal/Alertness: Awake/alert Orientation Level: Oriented X4 Attention: Selective Selective Attention: Appears intact Memory: Appears intact Awareness: Appears intact Problem Solving: Appears intact Safety/Judgment: Appears intact Sensation Sensation Light Touch: Impaired Detail Light Touch Impaired Details: Impaired RLE Stereognosis: Not tested Hot/Cold: Not tested Proprioception: Impaired Detail Proprioception Impaired Details: Impaired RLE Additional Comments: right side "more numb" and feels like "electricity" with frequent paresthesias Coordination Gross Motor Movements are Fluid and Coordinated: No Fine Motor Movements are Fluid and Coordinated: No Coordination and Movement Description: right LE dysmetria and ataxia during open chain mobility Heel Shin Test: impaired, dysmetria, decreased grading and accuracy Motor  Motor Motor: Ataxia;Abnormal postural alignment  and control Motor - Skilled Clinical Observations: multi-beat clonus at right ankle  Mobility Bed Mobility Bed Mobility: No Transfers Sit to Stand: 3: Mod assist Stand to Sit: 4: Min assist Locomotion  Ambulation Ambulation/Gait Assistance: 3: Mod assist Ambulation Distance (Feet): 25 Feet Assistive device: Other (Comment) (right hand held assist) Ambulation/Gait Assistance Details (indicate cue type and reason): ataxia with decreased grading of weight shift in all planes, decreased accuracy of right foot placement, decreased left step length, decreased right stance time Stairs / Additional Locomotion Stairs: No (did not perform due to high blood pressure ) Wheelchair Mobility Wheelchair Mobility: No (gait is primary means of mobility)  Trunk/Postural Assessment  Cervical Assessment Cervical Assessment: Within Functional Limits Thoracic Assessment Thoracic Assessment: Within Functional Limits Lumbar Assessment Lumbar Assessment: Within Functional Limits Postural Control Postural Control: Deficits on evaluation Trunk Control: mild ataxia at trunk with open chain movement Righting Reactions: delayed  Balance Balance Balance Assessed: Yes Standardized Balance Assessment Standardized Balance Assessment: Berg Balance Test Berg Balance Test Sit to Stand: Needs minimal aid to stand or to stabilize Standing Unsupported: Able to stand 2 minutes with supervision Sitting with Back Unsupported but Feet Supported on Floor or Stool: Able to sit safely and securely 2 minutes Stand to Sit: Uses backs of legs against chair to control descent Transfers: Needs one person to assist Standing Unsupported with Eyes Closed: Able to stand 10 seconds with supervision Standing Ubsupported with Feet Together: Needs help to attain position but able to stand for 30 seconds with feet together From Standing, Reach Forward with Outstretched Arm: Can reach forward >5 cm safely (2") From Standing Position,  Pick up Object from Floor: Unable to try/needs assist to keep balance From Standing Position, Turn to Look Behind Over each Shoulder: Looks behind one side only/other side shows less weight shift Turn 360 Degrees: Needs assistance while turning Standing Unsupported, Alternately Place Feet on Step/Stool: Needs assistance to keep from falling or unable to try Standing Unsupported, One Foot in Front: Loses balance while stepping or standing Standing on One Leg: Tries to lift leg/unable to hold 3 seconds but remains standing independently Total Score: 21  Static Sitting Balance Static Sitting - Balance Support: No upper extremity supported;Feet supported Static Sitting - Level of Assistance: 7: Independent Dynamic Sitting Balance Dynamic Sitting - Balance Support: Feet supported;During functional activity;No upper extremity supported Dynamic Sitting - Level of Assistance: 5: Stand by assistance Static Standing Balance Static Standing - Balance Support:  No upper extremity supported Static Standing - Level of Assistance: 4: Min assist Dynamic Standing Balance Dynamic Standing - Balance Support: No upper extremity supported;During functional activity Dynamic Standing - Level of Assistance: 3: Mod assist Extremity Assessment    RLE Assessment RLE Assessment: Exceptions to WFL (AROM WNL, strength grossly 3+/5, Tone WNL) LLE Assessment LLE Assessment: Within Functional Limits (AROM WNL, strength grossly 4/5. Tone WNL.)  Treatment Initiated During Session: BERG balance assessment completed, score 21/56 = 100% risk of fall. Discussed score result and functional implications. Patient verbalized understanding. Gait with right hand held assist mod assist 25 feet x 2, manual facilitation for graded lateral weight shifts, cues for increased left step length for step through pattern.  Recommendations for other services: None  Discharge Criteria: Patient will be discharged from PT if patient refuses  treatment 3 consecutive times without medical reason, if treatment goals not met, if there is a change in medical status, if patient makes no progress towards goals or if patient is discharged from hospital.  The above assessment, treatment plan, treatment alternatives and goals were discussed and mutually agreed upon: by patient and by family  Romeo Rabon 02/23/2011, 12:12 PM

## 2011-02-23 NOTE — Progress Notes (Signed)
Social Work Assessment and Plan Assessment and Plan  Patient Name: Jonathon [name]  ZOXWR'[id number] Date: 02/23/2011  Problem List:  Patient Active Problem List  Diagnoses  . Pontine hemorrhage  . HTN (hypertension)    Past Medical History:  Past Medical History  Diagnosis Date  . CHF (congestive heart failure)   . COPD (chronic obstructive pulmonary disease)   . Hypertension   . Smoker     Past Surgical History: No past surgical history on file.  Discharge Planning  Discharge Planning Support Systems: Spouse/significant other;Children;Church/faith community Assistance Needed: May require assist at discharge Patient expects to be discharged to:: Home Case Management Consult Needed: Yes (Comment) (RNCM following)  Social/Family/Support Systems Social/Family/Support Systems Patient Roles: Spouse;Parent;Other (Comment) Writer)  Employment Status Employment Status Employment Status: Employed Name of Employer: Self [employer] Return to Work Plans: Plans to return to work eventually Fish farm manager Issues: No issues Guardian/Conservator: None  Abuse/Neglect    Emotional Status Emotional Status Pt's affect, behavior adn adjustment status: Pt is very tired from therapies and lets wife answer for him.  He has been healthy and plans to be again. He wants to be independent. Recent Psychosocial Issues: Other health issues Pyschiatric History: No history- deferred Beck Depression Screen due to coping appropriately with stroke.  Does smoke he has no plans to quit, understands the risks.  Drinks feels socially no issues.  Patient/Family Perceptions, Expectations & Goals Pt/Family Perceptions, Expectations and Goals Pt/Family understanding of illness & functional limitations: Pt and wife can explain his stroke and deficits.  They are encouraged by his progress thus far, but also worried about his blood pressure.  Aware MD is working on this. Premorbid  pt/family roles/activities: Husband, Father, Associate Professor, Home Owner, etc Anticipated changes in roles/activities/participation: Plans to resume at discharge Pt/family expectations/goals: Pt states: " I plan to do for myself, no help."  Wife reports: " I will help with his care I will be there with him at home."  Longs Drug Stores: None Premorbid Home Care/DME Agencies: None Transportation available at discharge: Wife or children Resource referrals recommended: Support group (specify) ([facility] Support Group)  Discharge Visual merchandiser Resources: Medicaid (specify county) Medical sales representative) Financial Resources: Employment Surveyor, quantity Screen Referred: Yes Living Expenses: Lives with family Money Management: Patient;Spouse Home Management: Wife Patient/Family Preliminary Plans: Return home wife can be there with him.  Both are hopeful he will do well here.  Discussed following up with the MD and the importance of taking his medications at home.  Pt reports he will now because doesn't want this to happen again.  Clinical Impression:  Pleasant gentleman lying in bed trying to rest.  Wife at bedside and reports she will assist at home.  Concerned regarding blood pressure issues, aware MD working on and adjusting meds. Fairly high level, feel short length of stay.  Encouraged wife to go to DSS to follow up with Medicaid coverage.      Lucy Chris 02/23/2011

## 2011-02-23 NOTE — Progress Notes (Signed)
Patient ID: Jonathon [name], male   DOB: 06/08/1950, 61 y.o.   MRN: [id number] Subjective/Complaints:  Complains of bugs crawling on skin sensation.    Review of Systems  Eyes: Positive for double vision.  Neurological: Positive for sensory change. Dizziness: R hemibody.  All other systems reviewed and are negative.    Objective: Vital Signs: Blood pressure 164/97, pulse 70, temperature 98.7 F (37.1 C), temperature source Oral, resp. rate 18, height 6' (1.829 m), weight 91 kg (200 lb 9.9 oz), SpO2 96.00%. Ct Head Wo Contrast  02/21/2011  *RADIOLOGY REPORT*  Clinical Data: Dizziness, nausea  CT HEAD WITHOUT CONTRAST  Technique:  Contiguous axial images were obtained from the base of the skull through the vertex without contrast.  Comparison:   the previous day's study  Findings: Stable 13 mm left pontine hemorrhage.  No hydrocephalus. No new acute intracranial bleed, mass, mass effect, or parenchymal edema.  Mild atrophy with resultant prominence of ventricles and sulci as before.  No midline shift.  Patchy nonspecific regions of hypoattenuation in deep and periventricular white matter bilaterally as before.  Bone windows reveal no focal lesion.  IMPRESSION:  1.  Little change in left pontine hemorrhage. No acute/superimposed abnormality.  Original Report Authenticated By: Osa Craver, [name]   No results found for this or any previous visit (from the past 72 hour(s)).   Gen:  NAD Mood/affect : Approp Lungs:  Clear Cor:  RRR no Murmur Abd:  + BS soft NT Ext:  -C/C/E Sensation:  Reduce abil;ity to distinguish Lt touch, + hyperesthesia Motor 5/5 in L Bi,Delt, tri grip, HF, KE, ADF 4/5 in R Delt,Bi,Tri,Grip, HF, KE, ADF CN:  Diplopia with R lat gaze, partial INO   Assessment/Plan: 1. Functional deficits secondary to L dorsal pontine ICH which require 3+ hours per day of interdisciplinary therapy in a comprehensive inpatient rehab setting. Physiatrist is providing close team  supervision and 24 hour management of active medical problems listed below. Physiatrist and rehab team continue to assess barriers to discharge/monitor patient progress toward functional and medical goals. FIM:       FIM - Toileting Toileting steps completed by patient: Adjust clothing prior to toileting;Performs perineal hygiene;Adjust clothing after toileting Toileting Assistive Devices: Grab bar or rail for support Toileting: 7: Independent: No helper, no device  FIM - Diplomatic Services operational officer Devices: Grab bars Toilet Transfers: 4-To toilet/BSC: Min A (steadying Pt. > 75%)  FIM - Bed/Chair Transfer Bed/Chair Transfer Assistive Devices: Bed rails;Arm rests Bed/Chair Transfer: 5: Supine > Sit: Supervision (verbal cues/safety issues);5: Sit > Supine: Supervision (verbal cues/safety issues);4: Bed > Chair or W/C: Min A (steadying Pt. > 75%);4: Chair or W/C > Bed: Min A (steadying Pt. > 75%)     Comprehension Comprehension Mode: Auditory Comprehension: 6-Follows complex conversation/direction: With extra time/assistive device  Expression Expression Mode: Verbal Expression: 6-Expresses complex ideas: With extra time/assistive device  Social Interaction Social Interaction: 7-Interacts appropriately with others - No medications needed.  Problem Solving Problem Solving: 6-Solves complex problems: With extra time  Memory Memory: 6-More than reasonable amt of time  Medical Problem List and Plan:  1. DVT Prophylaxis/Anticoagulation: Mechanical: Sequential compression devices, below knee Bilateral lower extremities  2. Pain Management: has had neuropathic pain right hemibody. Neurontin on board but reports minimal effect. ? Trial of lyrica. Will add nortriptyline qhs  3. Mood: some anxiety especially regarding dysesthesias. Ego support offered. Will monitor for now.  4.HTN: Monitor with bid checks. SBP still ranging  160-170 and lisinopril increased today. Monitor  effects. Watch renal status with ace and diuretic on board.  5. COPD: No complaints of SOB or cough. No chronic medications.  6. CHF: Continue HCTZ. Monitor for signs of overload. Heart healthy diet. Check weights  7. Hypokalemia: Currently on supplement. ? Dilutional. Recheck in am.  8. Tobacco use: Educated patient stroke risk. Continue nicotine patch. Patient (and wife) smoke and not interested in quitting right now  LOS (Days) 1 A FACE TO FACE EVALUATION WAS PERFORMED  [name] E 02/23/2011, 9:53 AM

## 2011-02-23 NOTE — Progress Notes (Signed)
Patient information reviewed and entered into [facility] system by Shanan Mcmiller, RN, CRRN, PPS Coordinator.  Information including medical coding and functional independence measure will be reviewed and updated through discharge.    

## 2011-02-24 NOTE — Patient Care Conference (Signed)
Inpatient RehabilitationTeam Conference Note Date: 02/23/2011   Time: 11:15 AM    Patient Name: Jonathon [name]      Medical Record Number: [id number]  Date of Birth: 04-Jul-1950 Sex: Male         Room/Bed: 4036/4036-01 Payor Info: Payor: MEDICAID [place]  Plan: MEDICAID [place] ACCESS  Product Type: *No Product type*     Admitting Diagnosis: LT CVA  Admit Date/Time:  02/22/2011  4:59 PM Admission Comments: No comment available   Primary Diagnosis:  Pontine hemorrhage Principal Problem: Pontine hemorrhage  Patient Active Problem List  Diagnoses Date Noted  . Pontine hemorrhage 02/23/2011  . HTN (hypertension) 02/23/2011    Expected Discharge Date: Expected Discharge Date: 03/04/11  Team Members Present: Physician: Dr. Claudette Laws Case Manager Present: Lutricia Horsfall, RN Social Worker Present: Dossie Der, LCSW Nurse Present: Laural Roes, RN PT Present: Edman Circle, PT OT Present: Bretta Bang, OT SLP Present: Fae Pippin, SLP     Current Status/Progress Goal Weekly Team Focus  Medical     *        Bowel/Bladder     *        Swallow/Nutrition/ Hydration     *        ADL's     *        Mobility     *        Communication     *        Safety/Cognition/ Behavioral Observations    *        Pain     *        Skin     *           *See Interdisciplinary Assessment and Plan and progress notes for long and short-term goals  Barriers to Discharge:      Possible Resolutions to Barriers:       Discharge Planning/Teaching Needs:         Team Discussion:    *  Evaluations in process today. Pt with poor balance and coordination, ataxic.No vestibular sx. Pt hypertensive. Poor activity tolerance. Overall mod A.  D/c plan is home with wife.  Revisions to Treatment Plan:  none      Meryl Dare 02/24/2011, 9:31 AM

## 2011-02-24 NOTE — Progress Notes (Signed)
Per State Regulation 482.30 This chart was reviewed for medical necessity with respect to the patient's Admission/Duration of stay. Pt motivated and participating in therapies. Pt with HTN, monitoring BP. Multi medication adjustment this AM. Team conference yesterday. Met with pt and his wife yesterday and reported on team conference. Explained goals and target d/c date of 03/04/11. Both verbalized understanding and in agreement.   Meryl Dare                 Nurse Care Manager              Next Review Date: 02/28/11

## 2011-02-24 NOTE — Progress Notes (Signed)
Occupational Therapy Session Note  Patient Details  Name: Jonathon [name] MRN: [id number] Date of Birth: [birth year]  Today's Date: 02/24/2011 Time: 1120-1205 Time Calculation (min): 45 min  Pain:  No reports of pain  Precautions:  Precautions: Fall Precaution Comments: right sided ataxia and paresthesias Required Braces or Orthoses: No Weight Bearing Restrictions: No Other Position/Activity Restrictions: monitor BP, no strenuous activity is systolic > 165 and diastolic > 100  Short Term Goals: OT Short Term Goal 1: STG = LTG of overall Mod I and Supervision  Skilled Therapeutic Interventions:  Patient in bed upon arrival with wife and daughter present.  Initially, patient reports feeling very tired and lightheaded yet with encouragement patient willing to participate in session with 3 rest breaks provided in a 45 min session.  Engaged in bed mobility, bed>W/C stand step transfer, propelled W/C (feet only- pushing backward and forward, then hands only, then RUE & RLE only), sit><stands, and dynamic standing balance.  Focus session on forced use of RUE & RLE with all mobility, significant weight shifts to right while reaching in standing with min assist to recover each time.  Educated wife and daughter regarding the need to first allow patient to attempt a task before providing assistance.  Therapy/Group: Individual Therapy  Heberto Sturdevant 02/24/2011, 12:14 PM

## 2011-02-24 NOTE — Progress Notes (Signed)
Physical Therapy Note  Patient Details  Name: [name] MRN: [id number] Date of Birth: Jan 01, 1951 Today's Date: 02/24/2011  10:30- 11:00 individual therapy pt complained of dizziness and fatigue. Performed exercises in bed bridging, lumbar rolls, and ankle pumps focusing on control and speed. Pt educated on motor learning after stroke.   Julian Reil 02/24/2011, 12:13 PM

## 2011-02-24 NOTE — Progress Notes (Signed)
Occupational Therapy Session Note  Patient Details  Name: Jonathon [name] MRN: [id number] Date of Birth: March 29, 1950  Today's Date: 02/24/2011 Time: 0930-1000 Time Calculation (min): 30 min  Precautions: Precautions Precautions: Fall Precaution Comments: right sided ataxia and paresthesias Required Braces or Orthoses: No Restrictions Weight Bearing Restrictions: No Other Position/Activity Restrictions: monitor BP, no strenuous activity is systolic > 165 and diastolic > 100  Short Term Goals: OT Short Term Goal 1: STG = LTG of overall Mod I and Supervision  Skilled Therapeutic Interventions/Progress Updates:    Pt not in room upon arrival, out on grounds pass with wife.  Pt returned and reports that he did not want to bathe and dress this session as he had done it yesterday and still felt clean.  Educated pt on importance of bathing and dressing with therapy to increase independence and education on safety and alternate strategies to complete tasks that may be increasingly difficult post CVA.  Simulated tub/shower transfer in ADL tub room with tub bench.  Educated pt on safety with transfers in and out of tub/shower and fall risk, encouraging tub bench at this point.  Pt's wife reports that their tub is very high to step over, pt and wife agreeing that tub bench would be beneficial as well as a grab bar or two.  Functional mobility around ADL apt with hand held assist in kitchen and bathroom.  Pt reports feeling dizzy and required 2 rest breaks during session.  General General Amount of Missed OT Time (min): 30 Minutes (pt on grounds pass with wife off the floor) Vital Signs Therapy Vitals Pulse Rate: 73  BP: 160/92 mmHg Patient Position, if appropriate: Sitting Pain Pain Assessment Pain Assessment: 0-10 Pain Score:   5 Pain Type: Acute pain Pain Location: Arm Pain Orientation: Right Pain Descriptors: Tingling Pain Frequency: Occasional Pain Onset: Gradual Patients Stated  Pain Goal: 0 Pain Intervention(s): Emotional support Multiple Pain Sites: No  Therapy/Group: Individual Therapy  Leonette Monarch 02/24/2011, 10:59 AM

## 2011-02-24 NOTE — Progress Notes (Signed)
Patient ID: Jonathon [name], male   DOB: [birth year], 61 y.o.   MRN: [id number] Subjective/Complaints:  Complains of bugs crawling on skin sensation.  No better  Review of Systems  Eyes: Positive for double vision.  Neurological: Positive for sensory change. Dizziness: R hemibody.  All other systems reviewed and are negative.    Objective: Vital Signs: Blood pressure 161/92, pulse 71, temperature 98.5 F (36.9 C), temperature source Oral, resp. rate 18, height 6' (1.829 m), weight 91.309 kg (201 lb 4.8 oz), SpO2 92.00%. No results found. No results found for this or any previous visit (from the past 72 hour(s)).   Gen:  NAD Mood/affect : Approp Lungs:  Clear Cor:  RRR no Murmur Abd:  + BS soft NT Ext:  -C/C/E Sensation:  Reduce abil;ity to distinguish Lt touch, + hyperesthesia Motor 5/5 in L Bi,Delt, tri grip, HF, KE, ADF 4/5 in R Delt,Bi,Tri,Grip, HF, KE, ADF CN:  Diplopia with R lat gaze, partial INO   Assessment/Plan: 1. Functional deficits secondary to L dorsal pontine ICH which require 3+ hours per day of interdisciplinary therapy in a comprehensive inpatient rehab setting. Physiatrist is providing close team supervision and 24 hour management of active medical problems listed below. Physiatrist and rehab team continue to assess barriers to discharge/monitor patient progress toward functional and medical goals. FIM:       FIM - Toileting Toileting steps completed by patient: Adjust clothing prior to toileting;Performs perineal hygiene;Adjust clothing after toileting Toileting Assistive Devices: Grab bar or rail for support Toileting: 7: Independent: No helper, no device  FIM - Diplomatic Services operational officer Devices: Grab bars Toilet Transfers: 4-To toilet/BSC: Min A (steadying Pt. > 75%)  FIM - Bed/Chair Transfer Bed/Chair Transfer Assistive Devices: Bed rails;Arm rests Bed/Chair Transfer: 5: Supine > Sit: Supervision (verbal cues/safety  issues);5: Sit > Supine: Supervision (verbal cues/safety issues);4: Bed > Chair or W/C: Min A (steadying Pt. > 75%);4: Chair or W/C > Bed: Min A (steadying Pt. > 75%)  FIM - Locomotion: Wheelchair Locomotion: Wheelchair: 0: Activity did not occur FIM - Locomotion: Ambulation Ambulation/Gait Assistance: 3: Mod assist Locomotion: Ambulation: 1: Travels less than 50 ft with moderate assistance (Pt: 50 - 74%) (distance limited due to high blood pressure)  Comprehension Comprehension Mode: Auditory Comprehension: 7-Follows complex conversation/direction: With no assist  Expression Expression Mode: Verbal Expression: 7-Expresses complex ideas: With no assist  Social Interaction Social Interaction: 6-Interacts appropriately with others with medication or extra time (anti-anxiety, antidepressant).  Problem Solving Problem Solving: 7-Solves complex problems: Recognizes & self-corrects  Memory Memory: 6-More than reasonable amt of time  Medical Problem List and Plan:  1. DVT Prophylaxis/Anticoagulation: Mechanical: Sequential compression devices, below knee Bilateral lower extremities  2. Pain Management: has had neuropathic pain right hemibody. Neurontin on board but reports minimal effect. Increase dose titrate upward. ? Trial of lyrica. Will add nortriptyline qhs if Neurontin ineffective 3. Mood: some anxiety especially regarding dysesthesias. Ego support offered. Will monitor for now.  4.HTN: Monitor with bid checks. SBP still ranging 160-170 and lisinopril increased today. Monitor effects. Watch renal status with ace and diuretic on board. May need further increase. 5. COPD: No complaints of SOB or cough. No chronic medications.  6. CHF: Continue HCTZ. Monitor for signs of overload. Heart healthy diet. Check weights  7. Hypokalemia: Currently on supplement. ? Dilutional. Recheck in am.  8. Tobacco use: Educated patient stroke risk. Continue nicotine patch. Patient (and wife) smoke and not  interested in quitting right now  LOS (Days) 2 A FACE TO FACE EVALUATION WAS PERFORMED  KIRSTEINS,ANDREW E 02/24/2011, 8:03 AM

## 2011-02-24 NOTE — Progress Notes (Signed)
Physical Therapy Session Note  Patient Details  Name: Jonathon [name] MRN: [id number] Date of Birth: [birth year]  Today's Date: 02/24/2011 Time: 0730-0828 Time Calculation (min): 58 min  Precautions: Precautions Precautions: Fall Precaution Comments: right sided ataxia and paresthesias Required Braces or Orthoses: No Restrictions Weight Bearing Restrictions: No Other Position/Activity Restrictions: monitor BP, no strenuous activity is systolic > 165 and diastolic > 100  Short Term Goals: PT Short Term Goal 1: same as LTGs due to short length of stay PT Short Term Goal 1 - Progress: Progressing toward goal  Skilled Therapeutic Interventions/Progress Updates:     General Chart Reviewed: Yes Family/Caregiver Present: Yes (wife) Vital Signs Therapy Vitals Pulse Rate: 73  BP: 160/92 mmHg Patient Position, if appropriate: Sitting Pain Pain Assessment Pain Assessment: 0-10 Pain Score:   5 Pain Type: Acute pain Pain Location: Arm Pain Orientation: Right Pain Descriptors: Tingling Pain Frequency: Occasional Pain Onset: Gradual Patients Stated Pain Goal: 0 Pain Intervention(s): Emotional support, patient premedicated Multiple Pain Sites: No    Treatments  Standing dynamic pre-gait activities with emphasis on graded right weight shifts and upright posture followed by gait 50 feet x 4 with mirror for visual feedback, PT behind patient with manual facilitation at rib cage for graded weight shifts and providing min-mod assist with LOB right > left. Verbal cues for increased left step length and increased right stance time for improved symmetry. Improved smoothness of movement and accuracy of right LE placement on the floor noted during last trial. Stair negotiation sideways with left rail for home access leading with right LE, focus on accuracy of placement of right LE and smoothness of movement of right LE min assist x 10 steps. Standing alternating toe taps on 4-inch step, emphasis  on graded lateral weight shifts and graded accurate movement of right LE min assist. Balance reactions emerging. Standing on aerex foam with feet together catching and throwing ball mod assist for hip and ankle strategies and reaction time. Gait with PT behind patient x 25 feet min assist following with much improved placement of right LE and smoother more graded weight shifts noted.  Therapy/Group: Individual Therapy  Romeo Rabon 02/24/2011, 9:33 AM

## 2011-02-25 DIAGNOSIS — Z5189 Encounter for other specified aftercare: Secondary | ICD-10-CM

## 2011-02-25 DIAGNOSIS — I6330 Cerebral infarction due to thrombosis of unspecified cerebral artery: Secondary | ICD-10-CM

## 2011-02-25 DIAGNOSIS — I69993 Ataxia following unspecified cerebrovascular disease: Secondary | ICD-10-CM

## 2011-02-25 MED ORDER — POTASSIUM CHLORIDE CRYS ER 20 MEQ PO TBCR
20.0000 meq | EXTENDED_RELEASE_TABLET | Freq: Two times a day (BID) | ORAL | Status: DC
  Administered 2011-02-25 – 2011-03-03 (×12): 20 meq via ORAL
  Filled 2011-02-25 (×14): qty 1

## 2011-02-25 MED ORDER — GABAPENTIN 400 MG PO CAPS
800.0000 mg | ORAL_CAPSULE | Freq: Three times a day (TID) | ORAL | Status: DC
  Administered 2011-02-25 – 2011-03-03 (×19): 800 mg via ORAL
  Filled 2011-02-25 (×22): qty 2

## 2011-02-25 NOTE — Progress Notes (Signed)
Patient ID: Jonathon [name], male   DOB: 03/22/1950, 61 y.o.   MRN: [id number] Subjective/Complaints:  Complains of bugs crawling on skin sensation.  No better but slept well  Review of Systems  Eyes: Positive for double vision.  Neurological: Positive for sensory change. Dizziness: R hemibody.  All other systems reviewed and are negative.    Objective: Vital Signs: Blood pressure 143/88, pulse 63, temperature 98.4 F (36.9 C), temperature source Oral, resp. rate 18, height 6' (1.829 m), weight 91.309 kg (201 lb 4.8 oz), SpO2 90.00%. No results found. No results found for this or any previous visit (from the past 72 hour(s)).   Gen:  NAD Mood/affect : Approp Lungs:  Clear Cor:  RRR no Murmur Abd:  + BS soft NT Ext:  -C/C/E Sensation:  Reduce abil;ity to distinguish Lt touch, + hyperesthesia Motor 5/5 in L Bi,Delt, tri grip, HF, KE, ADF 4/5 in R Delt,Bi,Tri,Grip, HF, KE, ADF CN:  Diplopia with R lat gaze, partial INO   Assessment/Plan: 1. Functional deficits secondary to L dorsal pontine ICH which require 3+ hours per day of interdisciplinary therapy in a comprehensive inpatient rehab setting. Physiatrist is providing close team supervision and 24 hour management of active medical problems listed below. Physiatrist and rehab team continue to assess barriers to discharge/monitor patient progress toward functional and medical goals. FIM:       FIM - Toileting Toileting steps completed by patient: Adjust clothing prior to toileting;Performs perineal hygiene;Adjust clothing after toileting Toileting Assistive Devices: Grab bar or rail for support Toileting: 7: Independent: No helper, no device  FIM - Diplomatic Services operational officer Devices: Grab bars Toilet Transfers: 4-To toilet/BSC: Min A (steadying Pt. > 75%)  FIM - Bed/Chair Transfer Bed/Chair Transfer Assistive Devices: Bed rails;Arm rests Bed/Chair Transfer: 3: Bed > Chair or W/C: Mod A (lift or lower  assist);4: Chair or W/C > Bed: Min A (steadying Pt. > 75%)  FIM - Locomotion: Wheelchair Locomotion: Wheelchair: 1: Total Assistance/staff pushes wheelchair (Pt<25%) (gait is primary means of mobility) FIM - Locomotion: Ambulation Ambulation/Gait Assistance: 3: Mod assist Locomotion: Ambulation: 2: Travels 50 - 149 ft with moderate assistance (Pt: 50 - 74%)  Comprehension Comprehension Mode: Auditory Comprehension: 7-Follows complex conversation/direction: With no assist  Expression Expression Mode: Verbal Expression: 7-Expresses complex ideas: With no assist  Social Interaction Social Interaction: 6-Interacts appropriately with others with medication or extra time (anti-anxiety, antidepressant).  Problem Solving Problem Solving: 7-Solves complex problems: Recognizes & self-corrects  Memory Memory: 6-Assistive device: No helper  Medical Problem List and Plan:  1. DVT Prophylaxis/Anticoagulation: Mechanical: Sequential compression devices, below knee Bilateral lower extremities  2. Pain Management: has had neuropathic pain right hemibody. Neurontin on board but reports minimal effect. Increase dose titrate upward to 800mg  Will add nortriptyline qhs if Neurontin ineffective 3. Mood: some anxiety especially regarding dysesthesias. Ego support offered. Will monitor for now.  4.HTN: Monitor with bid checks. SBP still ranging 160-170 and lisinopril increased today. Monitor effects. Watch renal status with ace and diuretic on board. May need further increase. 5. COPD: No complaints of SOB or cough. No chronic medications.  6. CHF: Continue HCTZ. Monitor for signs of overload. Heart healthy diet. Check weights  7. Hypokalemia: Currently on supplement. ? Dilutional. Recheck in am.  8. Tobacco use: Educated patient stroke risk. Continue nicotine patch. Patient (and wife) smoke and not interested in quitting right now  LOS (Days) 3 A FACE TO FACE EVALUATION WAS PERFORMED  KIRSTEINS,ANDREW  E 02/25/2011, 7:26  AM    

## 2011-02-25 NOTE — Progress Notes (Signed)
Physical Therapy Session Note  Patient Details  Name: Jonathon [name] MRN: [id number] Date of Birth: 03-Dec-1950  Today's Date: 02/25/2011 Time: 1105-1205 Time Calculation (min): 60 min  Precautions: Precautions Precautions: Fall Precaution Comments: right sided ataxia and paresthesias Required Braces or Orthoses: No Restrictions Weight Bearing Restrictions: No Other Position/Activity Restrictions: monitor BP, no strenuous activity is systolic > 165 and diastolic > 100  Short Term Goals: PT Short Term Goal 1: same as LTGs due to short length of stay PT Short Term Goal 1 - Progress: Progressing toward goal  Skilled Therapeutic Interventions/Progress Updates: Treatment focused on neuromuscular re-education via manual contacts, VCs and weight bearing, transfer and gait training. Therapeutic activities in standing, challenging RUE and RLE coordination and motor control, with occasional min A for balance.  PROM R heel cord due to pt's c/o tightness.  Pt instructed in use of sheet, stool for stretching, pt return demonstrated safely.  Gait training with RW x 200' with min A for safety, focusing on upright trunk, forward gaze.  Gait speed for 10 m timed test = 32 seconds, = 1'/second, = risk for recurrent falls.  Pt complained of fatigue, but was able to participate for a full hour, given several short rest breaks.  During gait training, he declined a rest break.  Later, wife was present in room, and she stated that she had been walking him to BR.  PT urged them to use call bell for staff until she had completed training by therapists       Vital Signs BP 145/100, HR 66 after standing activities   Pain Pain Assessment Pain Assessment: 0-10 Pain Score:   5 (R UE and RLE, muscular pain) Pain Type: Acute pain Pain Location: Leg Pain Orientation: Right Pain Descriptors: Sore;Spasm Pain Frequency: Intermittent Pain Onset: With Activity Patients Stated Pain Goal: 2 Pain Intervention(s):  Medication (See eMAR)        Therapy/Group: Individual Therapy  Jerusha Reising 02/25/2011, 12:20 PM

## 2011-02-25 NOTE — Progress Notes (Signed)
Physical Therapy Session Note  Patient Details  Name: JARAN SAINZ MRN: [id number] Date of Birth: 03-23-1950  Today's Date: 02/25/2011 Time: 14:21   General Amount of Missed PT Time (min): 30 Minutes Missed Time Reason: Patient fatigue;Other (comment) (pt asleep, family member asked to not wake him) Family/Caregiver Present: Yes  Georges Mouse 02/25/2011, 2:21 PM

## 2011-02-25 NOTE — Progress Notes (Signed)
Occupational Therapy Session Note  Patient Details  Name: Jonathon [name] MRN: [id number] Date of Birth: Aug 14, 1950  Today's Date: 02/25/2011  Precautions: Precautions Precautions: Fall Precaution Comments: right sided ataxia and paresthesias Required Braces or Orthoses: No Restrictions Weight Bearing Restrictions: No Other Position/Activity Restrictions: monitor BP, no strenuous activity is systolic > 165 and diastolic > 100  Short Term Goals: OT Short Term Goal 1: STG = LTG of overall Mod I and Supervision  Treatment Session 1: Time: 1308-6578 Time Calculation (min): 50 min General Amount of Missed OT Time (min): 10 Minutes (pt on grounds pass off unit with wife) Pt already bathed and dressed, reporting that wife helped bathe him last night.  Pt and wife reporting that patient completed UB bathing and dressing and wife completed LB bathing and dressing.  Educated pt on importance of participating in bathing and dressing with therapy to educate pt on safety and various strategies to increase independence with task.  Pt donned bilat shoes with setup and increased time to don Rt shoe.  Educated pt on proper technique for sit to stand/stand to sit and encouraged pt to use BUE to push up to standing.  Functional mobility with hand held assist and slow gait.  NM re-ed in supine and sitting with focus on PNF patterns and BUE use to increase RUE ROM in functional tasks.  Treatment Session 2: Time: 1300-1305 Time Calculation: 5 min Pt in bed upon arrival.  Pt refused OT session this afternoon stating that he is too tired and fatigued to participate.  Encouraged pt to participate and explained to him the importance of getting out of bed to the healing process.  Pt was adamant to remain in bed.  Returned to check on pt 10 mins later with pt asleep and wife requesting that we cancel for today.   Therapy/Group: Individual Therapy  Leonette Monarch 02/25/2011, 9:44 AM

## 2011-02-26 NOTE — Progress Notes (Signed)
Patient ID: Jonathon [name], male   DOB: [birth year], 61 y.o.   MRN: [id number] Subjective/Complaints:  Complains of numb skin sensation.  slept well  Review of Systems  Eyes: Positive for double vision.  Neurological: Positive for sensory change. Dizziness: R hemibody.  All other systems reviewed and are negative.    Objective: Vital Signs: Blood pressure 148/90, pulse 66, temperature 98.3 F (36.8 C), temperature source Oral, resp. rate 16, height 6' (1.829 m), weight 91.309 kg (201 lb 4.8 oz), SpO2 94.00%. No results found. No results found for this or any previous visit (from the past 72 hour(s)).   Gen:  NAD Mood/affect : Approp Lungs:  Clear Cor:  RRR no Murmur Abd:  + BS soft NT Ext:  -C/C/E Sensation:  Reduce abil;ity to distinguish Lt touch, + hyperesthesia Motor 5/5 in L Bi,Delt, tri grip, HF, KE, ADF 4/5 in R Delt,Bi,Tri,Grip, HF, KE, ADF CN:  Diplopia with R lat gaze, partial INO   Assessment/Plan: 1. Functional deficits secondary to L dorsal pontine ICH which require 3+ hours per day of interdisciplinary therapy in a comprehensive inpatient rehab setting. Physiatrist is providing close team supervision and 24 hour management of active medical problems listed below. Physiatrist and rehab team continue to assess barriers to discharge/monitor patient progress toward functional and medical goals. FIM: FIM - Bathing Bathing: 0: Activity did not occur (wife assisted pt with bathing, reports pt completing 5/10 )  FIM - Upper Body Dressing/Undressing Upper body dressing/undressing: 0: Activity did not occur FIM - Lower Body Dressing/Undressing Lower body dressing/undressing steps patient completed: Don/Doff left shoe;Don/Doff right shoe Lower body dressing/undressing: 1: Total-Patient completed less than 25% of tasks (wife assisted with dressing; pt donned shoes in therapy)  FIM - Toileting Toileting steps completed by patient: Adjust clothing prior to  toileting;Performs perineal hygiene;Adjust clothing after toileting Toileting Assistive Devices: Grab bar or rail for support;Toilet Aid/prosthesis/orthosis Toileting: 6: Assistive device: No helper  FIM - Diplomatic Services operational officer Devices: Therapist, music Transfers: 4-To toilet/BSC: Min A (steadying Pt. > 75%)  FIM - Bed/Chair Transfer Bed/Chair Transfer Assistive Devices: Therapist, occupational: 5: Supine > Sit: Supervision (verbal cues/safety issues);5: Sit > Supine: Supervision (verbal cues/safety issues);4: Bed > Chair or W/C: Min A (steadying Pt. > 75%);4: Chair or W/C > Bed: Min A (steadying Pt. > 75%)  FIM - Locomotion: Wheelchair Locomotion: Wheelchair: 0: Activity did not occur FIM - Locomotion: Ambulation Ambulation/Gait Assistance: 3: Mod assist Locomotion: Ambulation: 4: Travels 150 ft or more with minimal assistance (Pt.>75%)  Comprehension Comprehension Mode: Auditory Comprehension: 7-Follows complex conversation/direction: With no assist  Expression Expression Mode: Verbal Expression: 7-Expresses complex ideas: With no assist  Social Interaction Social Interaction: 6-Interacts appropriately with others with medication or extra time (anti-anxiety, antidepressant).  Problem Solving Problem Solving: 6-Solves complex problems: With extra time  Memory Memory: 7-Complete Independence: No helper  Medical Problem List and Plan:  1. DVT Prophylaxis/Anticoagulation: Mechanical: Sequential compression devices, below knee Bilateral lower extremities  2. Pain Management: has had neuropathic pain right hemibody. Neurontin on board but reports minimal effect. Increase dose titrate upward to 800mg  Will add nortriptyline qhs if Neurontin ineffective 3. Mood: some anxiety especially regarding dysesthesias. Ego support offered. Will monitor for now.  4.HTN: Monitor with bid checks. SBP still ranging 160-170 and lisinopril increased today. Monitor effects.  Watch renal status with ace and diuretic on board. May need further increase.Check BMET 2/11 5. COPD: No complaints of SOB or cough. No chronic medications.  6. CHF: Continue HCTZ. Monitor for signs of overload. Heart healthy diet. Check weights  7. Hypokalemia: Currently on supplement. ? Dilutional. Recheck 2/11  8. Tobacco use: Educated patient stroke risk. Continue nicotine patch. Patient (and wife) smoke and not interested in quitting right now  LOS (Days) 4 A FACE TO FACE EVALUATION WAS PERFORMED  Jakarius Flamenco E 02/26/2011, 10:03 AM

## 2011-02-26 NOTE — Progress Notes (Signed)
Occupational Therapy Note  Patient Details  Name: Jonathon [name] MRN: [id number] Date of Birth: 30-Mar-1950 Today's Date: 02/26/2011 Group Treatment Pain:  None Time:  1300-1400 ( ) Pt. Engaged in therapeutic activities group with emphasis on sit to stand and standing balance. Pt was minimal to supervision with dynamic activities. No loss of balance.  No Pain       Humberto Seals 02/26/2011, 6:37 PM

## 2011-02-27 LAB — GLUCOSE, CAPILLARY: Glucose-Capillary: 154 mg/dL — ABNORMAL HIGH (ref 70–99)

## 2011-02-27 NOTE — Progress Notes (Signed)
Occupational Therapy Session Note  Patient Details  Name: Jonathon [name] MRN: [id number] Date of Birth: [birth year]  Today's Date: 02/27/2011  1st Session Time: 0900 -1000 Time Calculation (min): 60 min  Pain: No report of pain  Precautions: Precautions: Fall Precaution Comments: right sided ataxia and paresthesias Required Braces or Orthoses: No Weight Bearing Restrictions: No Other Position/Activity Restrictions: monitor BP, no strenuous activity is systolic > 165 and diastolic > 100  Short Term Goals: OT Short Term Goal 1: STG = LTG of overall Mod I and Supervision  Skilled Therapeutic Interventions: IADL retraining at ADL apartment with emphasis on safety during meal prep, dynamic standing and sitting balance, energy conservation, and home modifications in prep for discharge.   Patient completed breakfast meal prep due to having already bathed and dressed prior to OT treatment.  Patient cooked scrambled eggs for his wife in ADL apartment, first attempting to stand unsupported at stove and then revised to seated on swivel stool due to report of dizzyness and mild fatigue.  Patient completed meal prep to his satisfaction and verbalized that retrieving cookware from low cabinets was particularly difficult.  OT educated family on pre-discharge recommendation to revise kitchen arrangement to bring patient's favorite cookware (pots, pans, and utensils) to levels within easy reach.  Therapy: Individual Therapy  2nd Session Time: 4098-1191 Time Calculation (min): 45 min  Pain: No report of pain  Skilled Therapeutic Interventions: Therapeutic activities with emphasis on home exercise routine to improve dynamic standing balance using Nintendo Wii Balance Board and Wii Fit program.  Patient and spouse reported at beginning of treatment that they owned Wii system but did not know of games or activities available to assist with fitness goals.   OT demo'd use of Wii Fit and Balance board  and patient participated in training session successfully that revealed his balance deficits and provided insights on his BMI and general fitness level.  Following treatment, both patient and spouse were impressed by device and stated that would consider purchase of components demo'd.  Therapy: Individual Therapy  Georgeanne Nim 02/27/2011, 4:09 PM

## 2011-02-27 NOTE — Progress Notes (Signed)
Physical Therapy Note  Patient Details  Name: Jonathon [name] MRN: [id number] Date of Birth: [birth year] Today's Date: 02/27/2011  1478-2956 (40 minutes) individual Pain: pt reports no pain but c/o "tingling sensations Rt extremities" Focus of treatment: Gait training with/without AD to challenge dynamic standing balance; therapeutic activity for bilateral LE strengthening/standing balance Treatment: WC mobility 50 feet SBA using bilateral UEs to improve coordination/strength of RT UE; Gait with RW min assist 120 feet X 1 with decreased step length on left; Kinetron in standing with/without UE support 3 X 20 reps for strengthening bilateral LEs and to challenge standing balance during weight shifts. Pt. Able to maintain erect standing performing on Kinetron without UE support. ; Gait to room from gym without AD 120 feet min/mod assist with tactile cues to decrease forward trunk lean/decreased step length on left.   [name],JIM 02/27/2011, 11:33 AM

## 2011-02-27 NOTE — Progress Notes (Signed)
Physical Therapy Note  Patient Details  Name: QAADIR KENT MRN: [id number] Date of Birth: 1950/03/29 Today's Date: 02/27/2011  1430 1510 (40 minutes) individual Pain- no complaint of pain; pt reports RT extremity "tightness" Focus of treatment: Gait training without assistive device to improve balance reactions Treatment: WC mobility: pt propelled wc using bilateral UEs 120 feet (room to gym) SBA; Gait 60 feet X 3 without AD min assist with pt holding ball above head to improve trunk extension in stance; Nustep Level 4 X 10 minutes to improve tolerance to activity. Pt. Required less assist for balance with improved trunk/hip extension during gait.   Nkechi Linehan,JIM 02/27/2011, 3:29 PM

## 2011-02-27 NOTE — Progress Notes (Signed)
Patient ID: Jonathon [name], male   DOB: May 24, 1950, 61 y.o.   MRN: [id number] Subjective/Complaints:  Complains of numb skin sensation.  slept well.  Asked about KCL   Review of Systems  Eyes: Positive for double vision.  Neurological: Positive for sensory change. Dizziness: R hemibody.  All other systems reviewed and are negative.    Objective: Vital Signs: Blood pressure 145/81, pulse 66, temperature 98.5 F (36.9 C), temperature source Oral, resp. rate 18, height 6' (1.829 m), weight 91.309 kg (201 lb 4.8 oz), SpO2 92.00%. No results found. No results found for this or any previous visit (from the past 72 hour(s)).   Gen:  NAD Mood/affect : Approp Lungs:  Clear Cor:  RRR no Murmur Abd:  + BS soft NT Ext:  -C/C/E Sensation:  Reduce abil;ity to distinguish Lt touch, + hyperesthesia Motor 5/5 in L Bi,Delt, tri grip, HF, KE, ADF 4/5 in R Delt,Bi,Tri,Grip, HF, KE, ADF CN:  Diplopia with R lat gaze, partial INO   Assessment/Plan: 1. Functional deficits secondary to L dorsal pontine ICH which require 3+ hours per day of interdisciplinary therapy in a comprehensive inpatient rehab setting. Physiatrist is providing close team supervision and 24 hour management of active medical problems listed below. Physiatrist and rehab team continue to assess barriers to discharge/monitor patient progress toward functional and medical goals. FIM: FIM - Bathing Bathing: 0: Activity did not occur (he only wanted his face washed)  FIM - Upper Body Dressing/Undressing Upper body dressing/undressing: 0: Activity did not occur (wife helped with dressing) FIM - Lower Body Dressing/Undressing Lower body dressing/undressing steps patient completed: Don/Doff left shoe;Don/Doff right shoe Lower body dressing/undressing: 0: Activity did not occur (wife assist with dressing.patient said wife did all.)  FIM - Toileting Toileting steps completed by patient: Adjust clothing prior to toileting;Performs  perineal hygiene;Adjust clothing after toileting Toileting Assistive Devices: Grab bar or rail for support Toileting: 6: Assistive device: No helper  FIM - Diplomatic Services operational officer Devices: Grab bars Toilet Transfers: 4-To toilet/BSC: Min A (steadying Pt. > 75%)  FIM - Bed/Chair Transfer Bed/Chair Transfer Assistive Devices: Therapist, occupational: 4: Bed > Chair or W/C: Min A (steadying Pt. > 75%)  FIM - Locomotion: Wheelchair Locomotion: Wheelchair: 0: Activity did not occur FIM - Locomotion: Ambulation Ambulation/Gait Assistance: 3: Mod assist Locomotion: Ambulation: 4: Travels 150 ft or more with minimal assistance (Pt.>75%)  Comprehension Comprehension Mode: Asleep;Not assessed Comprehension: 7-Follows complex conversation/direction: With no assist  Expression Expression Mode: Not assessed;Asleep Expression: 7-Expresses complex ideas: With no assist  Social Interaction Social Interaction Mode: Asleep Social Interaction: 6-Interacts appropriately with others with medication or extra time (anti-anxiety, antidepressant).  Problem Solving Problem Solving Mode: Asleep Problem Solving: 6-Solves complex problems: With extra time  Memory Memory Mode: Asleep Memory: 7-Complete Independence: No helper  Medical Problem List and Plan:  1. DVT Prophylaxis/Anticoagulation: Mechanical: Sequential compression devices, below knee Bilateral lower extremities  2. Pain Management: has had neuropathic pain right hemibody. Neurontin on board but reports minimal effect. Increase dose titrate upward to 800mg  Will add nortriptyline qhs if Neurontin ineffective 3. Mood: some anxiety especially regarding dysesthesias. Ego support offered. Will monitor for now.  4.HTN: Monitor with bid checks. SBP still ranging 160-170 and lisinopril increased today. Monitor effects. Watch renal status with ace and diuretic on board. May need further increase.Check BMET 2/11 5. COPD: No  complaints of SOB or cough. No chronic medications.  6. CHF: Continue HCTZ. Monitor for signs of overload. Heart healthy diet.  Check weights  7. Hypokalemia: Currently on supplement. ? Dilutional. Recheck 2/11  8. Tobacco use: Educated patient stroke risk. Continue nicotine patch. Patient (and wife) smoke and not interested in quitting right now  LOS (Days) 5 A FACE TO FACE EVALUATION WAS PERFORMED  KIRSTEINS,ANDREW E 02/27/2011, 9:05 AM

## 2011-02-28 LAB — BASIC METABOLIC PANEL
BUN: 18 mg/dL (ref 6–23)
CO2: 26 mEq/L (ref 19–32)
Calcium: 9.5 mg/dL (ref 8.4–10.5)
Chloride: 102 mEq/L (ref 96–112)
Creatinine, Ser: 0.98 mg/dL (ref 0.50–1.35)
GFR calc Af Amer: >90 mL/min (ref >90)
GFR calc non Af Amer: 87 mL/min — ABNORMAL LOW (ref >90)
Glucose, Bld: 112 mg/dL — ABNORMAL HIGH (ref 70–99)
Potassium: 3.9 mEq/L (ref 3.5–5.1)
Sodium: 140 mEq/L (ref 135–145)

## 2011-02-28 LAB — DIFFERENTIAL
Basophils Absolute: 0.1 10*3/uL (ref 0.0–0.1)
Basophils Relative: 1 % (ref 0–1)
Eosinophils Absolute: 0.2 10*3/uL (ref 0.0–0.7)
Eosinophils Relative: 2 % (ref 0–5)
Lymphocytes Relative: 34 % (ref 12–46)
Lymphs Abs: 2.9 10*3/uL (ref 0.7–4.0)
Monocytes Absolute: 1.1 10*3/uL — ABNORMAL HIGH (ref 0.1–1.0)
Monocytes Relative: 13 % — ABNORMAL HIGH (ref 3–12)
Neutro Abs: 4.3 10*3/uL (ref 1.7–7.7)
Neutrophils Relative %: 51 % (ref 43–77)

## 2011-02-28 LAB — CBC
HCT: 46.1 % (ref 39.0–52.0)
Hemoglobin: 15.3 g/dL (ref 13.0–17.0)
MCH: 29.7 pg (ref 26.0–34.0)
MCHC: 33.2 g/dL (ref 30.0–36.0)
MCV: 89.3 fL (ref 78.0–100.0)
Platelets: 216 10*3/uL (ref 150–400)
RBC: 5.16 MIL/uL (ref 4.22–5.81)
RDW: 13.6 % (ref 11.5–15.5)
WBC: 8.5 10*3/uL (ref 4.0–10.5)

## 2011-02-28 NOTE — Progress Notes (Signed)
Occupational Therapy Session Note  Patient Details  Name: NIYAM BISPING MRN: [id number] Date of Birth: [birth year]  Today's Date: 02/28/2011  Precautions: Precautions Precautions: Fall Precaution Comments: right sided ataxia and paresthesias Required Braces or Orthoses: No Restrictions Weight Bearing Restrictions: No Other Position/Activity Restrictions: monitor BP, no strenuous activity is systolic > 165 and diastolic > 100  Short Term Goals: OT Short Term Goal 1: STG = LTG of overall Mod I and Supervision  Treatment Session 1: Time: 4098-1191 Time Calculation (min): 55 min Pt had completed self-care prior to session with assist of wife.  Explained to pt that bathing and dressing goals were set and would like to spend some therapy time focusing on adaptations and education on strategies to assist with bathing and dressing.  Encouraged pt to wait for therapist to complete bathing and dressing tomorrow.  Therapeutic exercise with focus on activity tolerance, balance, and BUE strengthening.  BUE task in standing at mirror in PNF patterns and strengthening with 2# medicine ball in sitting.  Nustep for 10 mins with focus on maintaining contact with RUE on handle. Pt with no c/o pain.  Reports sensation of bugs in RUE and RLE throughout session.  Treatment Session 2: Time: 1300-1330 Time Calculation: 30 min Engaged in therapeutic activity with focus on dynamic standing balance and RUE use in Wii bowling task.  Pt with no LOB and demonstrated appropriate balance reactions. Pt with no c/o pain this session.  Therapy/Group: Individual Therapy  Leonette Monarch 02/28/2011, 10:58 AM

## 2011-02-28 NOTE — Progress Notes (Signed)
Patient ID: Jonathon [name], male   DOB: Jun 11, 1950, 61 y.o.   MRN: [id number] Subjective/Complaints:  No complains of numb skin sensation.  slept well.  Review of Systems  Eyes: Positive for double vision.  Neurological: Positive for sensory change. Dizziness: R hemibody.  All other systems reviewed and are negative.    Objective: Vital Signs: Blood pressure 122/78, pulse 62, temperature 98.3 F (36.8 C), temperature source Oral, resp. rate 20, height 6' (1.829 m), weight 91.309 kg (201 lb 4.8 oz), SpO2 96.00%. No results found. Results for orders placed during the hospital encounter of 02/22/11 (from the past 72 hour(s))  GLUCOSE, CAPILLARY     Status: Abnormal   Collection Time   02/27/11  9:40 PM      Component Value Range Comment   Glucose-Capillary 154 (*) 70 - 99 (mg/dL)    Comment 1 Notify RN      Comment 2 Documented in Chart     CBC     Status: Normal   Collection Time   02/28/11  7:00 AM      Component Value Range Comment   WBC 8.5  4.0 - 10.5 (K/uL)    RBC 5.16  4.22 - 5.81 (MIL/uL)    Hemoglobin 15.3  13.0 - 17.0 (g/dL)    HCT 40.9  81.1 - 91.4 (%)    MCV 89.3  78.0 - 100.0 (fL)    MCH 29.7  26.0 - 34.0 (pg)    MCHC 33.2  30.0 - 36.0 (g/dL)    RDW 78.2  95.6 - 21.3 (%)    Platelets 216  150 - 400 (K/uL)   DIFFERENTIAL     Status: Abnormal   Collection Time   02/28/11  7:00 AM      Component Value Range Comment   Neutrophils Relative 51  43 - 77 (%)    Neutro Abs 4.3  1.7 - 7.7 (K/uL)    Lymphocytes Relative 34  12 - 46 (%)    Lymphs Abs 2.9  0.7 - 4.0 (K/uL)    Monocytes Relative 13 (*) 3 - 12 (%)    Monocytes Absolute 1.1 (*) 0.1 - 1.0 (K/uL)    Eosinophils Relative 2  0 - 5 (%)    Eosinophils Absolute 0.2  0.0 - 0.7 (K/uL)    Basophils Relative 1  0 - 1 (%)    Basophils Absolute 0.1  0.0 - 0.1 (K/uL)      Gen:  NAD Mood/affect : Approp Lungs:  Clear Cor:  RRR no Murmur Abd:  + BS soft NT Ext:  -C/C/E Sensation:  Reduce abil;ity to distinguish  Lt touch, + hyperesthesia Motor 5/5 in L Bi,Delt, tri grip, HF, KE, ADF 4/5 in R Delt,Bi,Tri,Grip, HF, KE, ADF CN:  Diplopia with R lat gaze, partial INO Ataxia noted in RUE and RLE  Assessment/Plan: 1. Functional deficits secondary to L dorsal pontine ICH which require 3+ hours per day of interdisciplinary therapy in a comprehensive inpatient rehab setting. Physiatrist is providing close team supervision and 24 hour management of active medical problems listed below. Physiatrist and rehab team continue to assess barriers to discharge/monitor patient progress toward functional and medical goals. FIM: FIM - Bathing Bathing: 0: Activity did not occur (he only wanted his face washed)  FIM - Upper Body Dressing/Undressing Upper body dressing/undressing: 0: Activity did not occur (wife helped with dressing) FIM - Lower Body Dressing/Undressing Lower body dressing/undressing steps patient completed: Don/Doff left shoe;Don/Doff right shoe Lower body dressing/undressing:  0: Activity did not occur (wife assist with dressing.patient said wife did all.)  FIM - Toileting Toileting steps completed by patient: Adjust clothing prior to toileting;Performs perineal hygiene;Adjust clothing after toileting Toileting Assistive Devices: Grab bar or rail for support Toileting: 6: Assistive device: No helper  FIM - Diplomatic Services operational officer Devices: Grab bars Toilet Transfers: 4-To toilet/BSC: Min A (steadying Pt. > 75%)  FIM - Bed/Chair Transfer Bed/Chair Transfer Assistive Devices: Therapist, occupational: 0: Activity did not occur  FIM - Locomotion: Wheelchair Locomotion: Wheelchair: 2: Travels 50 - 149 ft with supervision, cueing or coaxing FIM - Locomotion: Ambulation Locomotion: Ambulation Assistive Devices: Designer, industrial/product Ambulation/Gait Assistance: 4: Min assist Locomotion: Ambulation: 2: Travels 50 - 149 ft with minimal assistance  (Pt.>75%)  Comprehension Comprehension Mode: Asleep;Not assessed Comprehension: 7-Follows complex conversation/direction: With no assist  Expression Expression Mode: Verbal Expression: 7-Expresses complex ideas: With no assist  Social Interaction Social Interaction Mode: Asleep Social Interaction: 6-Interacts appropriately with others with medication or extra time (anti-anxiety, antidepressant).  Problem Solving Problem Solving Mode: Asleep Problem Solving: 6-Solves complex problems: With extra time  Memory Memory Mode: Asleep Memory: 7-Complete Independence: No helper  Medical Problem List and Plan:  1. DVT Prophylaxis/Anticoagulation: Mechanical: Sequential compression devices, below knee Bilateral lower extremities  2. Pain Management: has had neuropathic pain right hemibody. Neurontin on board but reports minimal effect. Increase dose titrate upward to 800mg  Will add nortriptyline qhs if Neurontin ineffective 3. Mood: some anxiety especially regarding dysesthesias. Ego support offered. Will monitor for now.  4.HTN: Monitor with bid checks. SBP still ranging 160-170 and lisinopril increased today. Monitor effects. Watch renal status with ace and diuretic on board. May need further increase.Check BMET 2/11 5. COPD: No complaints of SOB or cough. No chronic medications.  6. CHF: Continue HCTZ. Monitor for signs of overload. Heart healthy diet. Check weights  7. Hypokalemia: Currently on supplement. ? Dilutional. Recheck 2/11  8. Tobacco use: Educated patient stroke risk. Continue nicotine patch. Patient (and wife) smoke and not interested in quitting right now  LOS (Days) 6 A FACE TO FACE EVALUATION WAS PERFORMED  KIRSTEINS,ANDREW E 02/28/2011, 7:49 AM

## 2011-02-28 NOTE — Progress Notes (Signed)
Physical Therapy Note  Patient Details  Name: Jonathon [name] MRN: [id number] Date of Birth: Dec 19, 1950 Today's Date: 02/28/2011  1330-1405 (35 minutes) individual  Pain- no complaint of pain Focus of treatment: gait training without AD to facilitate protective balance reactions; therapeutic activity to facilitate ankle protective balance reaction Treatment: Gait 200+ feet min assist with gait in all directions- forward,sideways, backwards with head turns  X 2 with occ. Min assist when turning : standing on soft surface (foam) performing Wii bowling game min to close supervision; throwing basketball with alternate steps forward and backward with assist forbalance X 1 with cues to maintain wider base of support. Pt. Ambulated without AD to/from room min for safety.  Chirstina Haan,JIM 02/28/2011, 2:00 PM

## 2011-02-28 NOTE — Progress Notes (Signed)
Physical Therapy Note  Patient Details  Name: Jonathon [name] MRN: [id number] Date of Birth: January 19, 1950 Today's Date: 02/28/2011  1100-1155 (55 minutes) individual Pain- no c/o of pain . Pt continues to report "tightness" Rt extremities Focus of treatment: Gait training without AD ; therapeutic activities to facilitate standing balance reactions/improve single leg stance time Treatment: gait room to gym X 120 feet min assist with tactile cues at shoulder to facilitate trunk extension in stance. Pts displays increasing step length and improve arm swing during gait; Standing alternate stepping to 6 inch step X 10 with one loss of balance; alternate stepping over 4 inch object on floor min assist with decreased stance time RT LE; stepping forward/backward over 4 inch object min assist with assist for balance.   Jonathon [name],JIM 02/28/2011, 11:34 AM

## 2011-03-01 NOTE — Progress Notes (Signed)
Physical Therapy Session Note  Patient Details  Name: Jonathon [name] MRN: [id number] Date of Birth: 03/11/1950  Today's Date: 03/01/2011 Time: 1300-1400   Skilled Therapeutic Interventions/Progress Updates:   Patient demonstrates increased fall risk as noted by score of  41 /56 on Berg Balance Scale.  (<36= high risk for falls, close to 100%; 37-45 significant >80%; 46-51 moderate >50%; 52-55 lower >25%) but this is improved from initial score of 21.  Fall prevention and risks discussed, pt in agreement to use RW at all times until told otherwise by f/u PT.  Discussed increased risk during dual tasks conditions. Practiced floor transfers for fall recovery.  Discussed moving d/c date up with pt and primary therapists, pt in agreement.   Therapy Documentation Precautions:  Precautions Precautions: Fall Precaution Comments: falls, monitor BP Required Braces or Orthoses: No Restrictions Weight Bearing Restrictions: No Other Position/Activity Restrictions: monitor BP, no strenuous activity is systolic > 165 and diastolic > 100    Vital Signs: BP 124/108 afterr esting, nurse informed Therapy Vitals Pulse Rate: 68  BP: 149/104 mmHg Pain: Pain Assessment Pain Assessment: No/denies pain Mobility: Transfers Sit to Stand: 6: Modified independent (Device/Increase time);Without upper extremity assist;With upper extremity assist Stand to Sit: With upper extremity assist;6: Modified independent (Device/Increase time) Stand Pivot Transfers: 6: Modified independent (Device/Increase time) Locomotion : Transfers sit to stand and stand pivot mod I with UE use Gait with RW 150 x 2 S with decreased cadence, question d/t fatigue after lunch vs. RW slowing him as compared to without device in am session, discussed fall risks, pt practiced head turns (horizonatal) States he does have dizziness with mobility but unable to provide more specifics :    Balance: Standardized Balance  Assessment Standardized Balance Assessment: Berg Balance Test Berg Balance Test Sit to Stand: Able to stand without using hands and stabilize independently Standing Unsupported: Able to stand safely 2 minutes Sitting with Back Unsupported but Feet Supported on Floor or Stool: Able to sit safely and securely 2 minutes Stand to Sit: Controls descent by using hands Transfers: Able to transfer safely, definite need of hands Standing Unsupported with Eyes Closed: Able to stand 10 seconds with supervision Standing Ubsupported with Feet Together: Able to place feet together independently and stand for 1 minute with supervision From Standing, Reach Forward with Outstretched Arm: Can reach confidently >25 cm (10") From Standing Position, Pick up Object from Floor: Able to pick up shoe, needs supervision From Standing Position, Turn to Look Behind Over each Shoulder: Needs supervision when turning Turn 360 Degrees: Needs close supervision or verbal cueing Standing Unsupported, Alternately Place Feet on Step/Stool: Able to complete 4 steps without aid or supervision Standing Unsupported, One Foot in Front: Able to plae foot ahead of the other independently and hold 30 seconds Standing on One Leg: Able to lift leg independently and hold 5-10 seconds Total Score: 41  Dynamic Standing Balance Dynamic Standing - Comments: heel toe raises, tandem stance, single leg stance, hip Abduction with 5 # on RLE- needed atleast 1 UE for support to balance  Other Treatments: Treatments Therapeutic Activity: floor transfer, performed without A after demonstration  See FIM for current functional status  Therapy/Group: Individual Therapy  Michaelene Song 03/01/2011, 1:44 PM

## 2011-03-01 NOTE — Progress Notes (Signed)
Occupational Therapy Session Note  Patient Details  Name: Jonathon [name] MRN: [id number] Date of Birth: 08-Feb-1950  Today's Date: 03/01/2011 Time: 1030-1120 Time Calculation (min): 50 min   Skilled Therapeutic Interventions/Progress Updates:    Patient seen this am for 1:1 OT intervention to address neuromuscular reeducation to left upper and lower extremity with both gross and fine motor movements.  Worked with patient on graded motor activity, with walking (placing foot) and with functional upper extremity tasks. Challenged patient to utilize upper extremities while walking through mildly busy environment (hospital hallway.)  Patient's wife present for OT session.  Therapy Documentation Precautions:  Precautions Precautions: Fall Precaution Comments: falls, monitor BP Required Braces or Orthoses: No Restrictions Weight Bearing Restrictions: No Other Position/Activity Restrictions: monitor BP, no strenuous activity is systolic > 165 and diastolic > 100 General: Pain: Pain Assessment Pain Assessment: No/denies pain:  Patient consistently reports nerve pain type symptoms,  "it feels like a band wrapped tightly around" forearm, ankle, and foot on left side.  See FIM for current functional status  Therapy/Group: Individual Therapy  [name] 03/01/2011, 2:44 PM

## 2011-03-01 NOTE — Progress Notes (Addendum)
Physical Therapy Session Note  Patient Details  Name: Jonathon [name] MRN: [id number] Date of Birth: 1950-02-10  Today's Date: 03/01/2011 Time:1134-1205 30 min tx session  Short Term Goals: Week 1:  PT Short Term Goal 1(DO NOT USE): same as LTGs due to short length of stay PT Short Term Goal 1 - Progress: Progressing toward goal  Skilled Therapeutic Interventions/Progress Updates:    balance training for fall prevention; education on stroke risk factors/BP med compliance on rest break  Therapy Documentation Precautions:  Precautions Precautions: Fall Precaution Comments: right sided ataxia and paresthesias Required Braces or Orthoses: No Restrictions Weight Bearing Restrictions: No Other Position/Activity Restrictions: monitor BP, no strenuous activity is systolic > 165 and diastolic > 100   Vital Signs: Therapy Vitals Pulse Rate: 66  BP: 147/96 mmHg Pain: Pain Assessment Pain Assessment: No/denies pain   Locomotion :min A gait without device Gait Gait: Yes Gait Pattern: Step-through pattern Gait velocity: NT, LOB to R, increased stagger with head turns- min A 150 x 2      Balance: Dynamic Standing Balance Dynamic Standing - Comments: heel toe raises, tandem stance, single leg stance, hip Abduction with 5 # on RLE- needed atleast 1 UE for support to balance Exercises: Other Exercises Other Exercises: alphabet with R ankle     See FIM for current functional status  Therapy/Group: Individual Therapy  Michaelene Song 03/01/2011, 12:27 PM

## 2011-03-01 NOTE — Progress Notes (Signed)
Occupational Therapy Session Note  Patient Details  Name: Jonathon [name] MRN: [id number] Date of Birth: 11/23/1950  Today's Date: 03/01/2011 Time: 0900-0955 Time Calculation (min): 55 min  Short Term Goals: No short term goals set  Skilled Therapeutic Interventions/Progress Updates:    Pt seen for ADL retraining at walk-in shower level with focus on increased independence with bathing and dressing.  Pt mod I with bathing, min assist with dressing - requiring mod verbal cues to attempt tasks.  Pt requesting wife to assist in all tasks, provided encouragement to attempt activity prior to asking for assistance.  Pt only required assist with donning Rt sock due to SOB with bending forward.  Therapy Documentation Precautions:  Precautions Precautions: Fall Precaution Comments: right sided ataxia and paresthesias Required Braces or Orthoses: No Restrictions Weight Bearing Restrictions: No Other Position/Activity Restrictions: monitor BP, no strenuous activity is systolic > 165 and diastolic > 100 Pain:   No c/o pain this session.   See FIM for current functional status  Therapy/Group: Individual Therapy  Leonette Monarch 03/01/2011, 10:53 AM

## 2011-03-01 NOTE — Progress Notes (Signed)
Patient ID: Jonathon [name], male   DOB: [birth year], 61 y.o.   MRN: [id number] Subjective/Complaints:   complains of numb skin sensation.  slept well but has pain when sleeping on R shoulder.  Review of Systems  Eyes: Positive for double vision.  Neurological: Positive for sensory change. Dizziness: R hemibody.  All other systems reviewed and are negative.    Objective: Vital Signs: Blood pressure 150/82, pulse 66, temperature 98.5 F (36.9 C), temperature source Oral, resp. rate 17, height 6' (1.829 m), weight 91.309 kg (201 lb 4.8 oz), SpO2 94.00%. No results found. Results for orders placed during the hospital encounter of 02/22/11 (from the past 72 hour(s))  GLUCOSE, CAPILLARY     Status: Abnormal   Collection Time   02/27/11  9:40 PM      Component Value Range Comment   Glucose-Capillary 154 (*) 70 - 99 (mg/dL)    Comment 1 Notify RN      Comment 2 Documented in Chart     CBC     Status: Normal   Collection Time   02/28/11  7:00 AM      Component Value Range Comment   WBC 8.5  4.0 - 10.5 (K/uL)    RBC 5.16  4.22 - 5.81 (MIL/uL)    Hemoglobin 15.3  13.0 - 17.0 (g/dL)    HCT 41.3  24.4 - 01.0 (%)    MCV 89.3  78.0 - 100.0 (fL)    MCH 29.7  26.0 - 34.0 (pg)    MCHC 33.2  30.0 - 36.0 (g/dL)    RDW 27.2  53.6 - 64.4 (%)    Platelets 216  150 - 400 (K/uL)   DIFFERENTIAL     Status: Abnormal   Collection Time   02/28/11  7:00 AM      Component Value Range Comment   Neutrophils Relative 51  43 - 77 (%)    Neutro Abs 4.3  1.7 - 7.7 (K/uL)    Lymphocytes Relative 34  12 - 46 (%)    Lymphs Abs 2.9  0.7 - 4.0 (K/uL)    Monocytes Relative 13 (*) 3 - 12 (%)    Monocytes Absolute 1.1 (*) 0.1 - 1.0 (K/uL)    Eosinophils Relative 2  0 - 5 (%)    Eosinophils Absolute 0.2  0.0 - 0.7 (K/uL)    Basophils Relative 1  0 - 1 (%)    Basophils Absolute 0.1  0.0 - 0.1 (K/uL)   BASIC METABOLIC PANEL     Status: Abnormal   Collection Time   02/28/11  7:00 AM      Component Value Range  Comment   Sodium 140  135 - 145 (mEq/L)    Potassium 3.9  3.5 - 5.1 (mEq/L)    Chloride 102  96 - 112 (mEq/L)    CO2 26  19 - 32 (mEq/L)    Glucose, Bld 112 (*) 70 - 99 (mg/dL)    BUN 18  6 - 23 (mg/dL)    Creatinine, Ser 0.34  0.50 - 1.35 (mg/dL)    Calcium 9.5  8.4 - 10.5 (mg/dL)    GFR calc non Af Amer 87 (*) >90 (mL/min)    GFR calc Af Amer >90  >90 (mL/min)      Gen:  NAD Mood/affect : Approp Lungs:  Clear Cor:  RRR no Murmur Abd:  + BS soft NT Ext:  -C/C/E Sensation:  Reduce abil;ity to distinguish Lt touch, + hyperesthesia Motor 5/5  in L Bi,Delt, tri grip, HF, KE, ADF 4/5 in R Delt,Bi,Tri,Grip, HF, KE, ADF CN:  Diplopia with R lat gaze, partial INO Ataxia noted in RUE and RLE  Assessment/Plan: 1. Functional deficits secondary to L dorsal pontine ICH which require 3+ hours per day of interdisciplinary therapy in a comprehensive inpatient rehab setting. Physiatrist is providing close team supervision and 24 hour management of active medical problems listed below. Physiatrist and rehab team continue to assess barriers to discharge/monitor patient progress toward functional and medical goals. FIM: FIM - Bathing Bathing: 0: Activity did not occur (he only wanted his face washed)  FIM - Upper Body Dressing/Undressing Upper body dressing/undressing: 0: Activity did not occur (wife helped with dressing) FIM - Lower Body Dressing/Undressing Lower body dressing/undressing steps patient completed: Don/Doff left shoe;Don/Doff right shoe Lower body dressing/undressing: 0: Activity did not occur (wife assist with dressing.patient said wife did all.)  FIM - Toileting Toileting steps completed by patient: Adjust clothing prior to toileting;Performs perineal hygiene;Adjust clothing after toileting Toileting Assistive Devices: Grab bar or rail for support Toileting: 6: Assistive device: No helper  FIM - Diplomatic Services operational officer Devices: Grab bars Toilet  Transfers: 4-To toilet/BSC: Min A (steadying Pt. > 75%)  FIM - Bed/Chair Transfer Bed/Chair Transfer Assistive Devices: Therapist, occupational: 0: Activity did not occur  FIM - Locomotion: Wheelchair Locomotion: Wheelchair: 0: Activity did not occur FIM - Locomotion: Ambulation Locomotion: Ambulation Assistive Devices: Designer, industrial/product Ambulation/Gait Assistance: 5: Supervision Locomotion: Ambulation: 2: Travels 50 - 149 ft with supervision/safety issues  Comprehension Comprehension Mode: Asleep;Not assessed Comprehension: 7-Follows complex conversation/direction: With no assist  Expression Expression Mode: Verbal Expression: 7-Expresses complex ideas: With no assist  Social Interaction Social Interaction Mode: Asleep Social Interaction: 7-Interacts appropriately with others - No medications needed.  Problem Solving Problem Solving Mode: Asleep Problem Solving: 7-Solves complex problems: Recognizes & self-corrects  Memory Memory Mode: Asleep Memory: 7-Complete Independence: No helper  Medical Problem List and Plan:  1. DVT Prophylaxis/Anticoagulation: Mechanical: Sequential compression devices, below knee Bilateral lower extremities  2. Pain Management: has had neuropathic pain right hemibody. Neurontin on boardless pain still numb of course.R shoulder pain when he sleeps on it 3. Mood: some anxiety especially regarding dysesthesias. Ego support offered. Will monitor for now.  4.HTN: Monitor with bid checks. . Watch renal status with ace and diuretic on board. May need further increase.Check BMET 2/11 looks ok 5. COPD: No complaints of SOB or cough. No chronic medications.  6. CHF: Continue HCTZ. Monitor for signs of overload. Heart healthy diet. Check weights  7. Hypokalemia: Currently on supplement. ? Dilutional. Recheck 2/11  8. Tobacco use: Educated patient stroke risk. Continue nicotine patch. Patient (and wife) smoke and not interested in quitting right now  LOS  (Days) 7 A FACE TO FACE EVALUATION WAS PERFORMED  Carina Chaplin E 03/01/2011, 7:37 AM

## 2011-03-02 MED ORDER — LISINOPRIL 20 MG PO TABS
20.0000 mg | ORAL_TABLET | Freq: Two times a day (BID) | ORAL | Status: DC
  Filled 2011-03-02 (×2): qty 1

## 2011-03-02 MED ORDER — LISINOPRIL 20 MG PO TABS
20.0000 mg | ORAL_TABLET | Freq: Two times a day (BID) | ORAL | Status: DC
  Administered 2011-03-02 – 2011-03-03 (×2): 20 mg via ORAL
  Filled 2011-03-02 (×4): qty 1

## 2011-03-02 MED ORDER — NORTRIPTYLINE HCL 25 MG PO CAPS
25.0000 mg | ORAL_CAPSULE | Freq: Every day | ORAL | Status: DC
  Administered 2011-03-02: 25 mg via ORAL
  Filled 2011-03-02 (×2): qty 1

## 2011-03-02 NOTE — Discharge Summary (Signed)
Occupational Therapy Discharge Summary  Patient Details  Name: Jonathon [name] MRN: [id number] Date of Birth: 06-11-1950 Today's Date: 03/02/2011  Patient has met 10 of 10 long term goals due to improved activity tolerance, improved balance, ability to compensate for deficits, functional use of  RIGHT upper extremity and improved awareness.  Patient to discharge at overall Supervision level.  Patient's care partner is independent to provide the necessary physical and cognitive assistance at discharge.  Pt requires encouragement to attempt tasks with increased independence as he has learned to depend on wife to assist with many tasks.  Educated wife on encouraging pt to attempt a task before asking for assistance as pt is usually able to complete the task independently.  Reasons goals not met: n/a  Recommendation:  Patient will benefit from ongoing skilled OT services in outpatient setting to continue to advance functional skills in the area of iADL.  Equipment: tub bench and RW  Reasons for discharge: treatment goals met and discharge from hospital  Patient/family agrees with progress made and goals achieved: Yes  OT Discharge Precautions/Restrictions  Precautions Precautions: Fall Precaution Comments: falls, monitor BP Restrictions Weight Bearing Restrictions: No General   Vital Signs   Pain Pain Assessment Pain Assessment: No/denies pain Pain Score:   2 Pain Type: Acute pain Pain Location: Arm Pain Orientation: Right Pain Descriptors: Aching ("little aching") Pain Intervention(s): Medication (See eMAR) ADL ADL Eating: Independent Where Assessed-Eating: Chair Grooming: Supervision/safety Where Assessed-Grooming: Standing at sink Upper Body Bathing: Modified independent Where Assessed-Upper Body Bathing: Shower Lower Body Bathing: Modified independent Where Assessed-Lower Body Bathing: Shower Upper Body Dressing: Modified independent (Device) Where Assessed-Upper  Body Dressing: Sitting at sink Lower Body Dressing: Supervision/safety Where Assessed-Lower Body Dressing: Sitting at sink Toileting: Supervision/safety Toilet Transfer: Close supervision Toilet Transfer Method: Surveyor, minerals: Raised toilet seat;Grab bars Tub/Shower Transfer: Close supervison;Minimal cueing Web designer Method: Engineer, technical sales: Insurance underwriter: Distant supervision Film/video editor Method: Warden/ranger: Emergency planning/management officer ADL Comments: Verbal cues to sit down to doff/donn pants instead of stand on one leg. Vision/Perception  Vision - History Baseline Vision: Wears glasses all the time Patient Visual Report: No change from baseline Vision - Assessment Eye Alignment: Within Functional Limits Perception Perception: Within Functional Limits Praxis Praxis: Intact  Cognition Overall Cognitive Status: Appears within functional limits for tasks assessed Arousal/Alertness: Awake/alert Orientation Level: Oriented X4 Attention: Selective Sensation Sensation Light Touch: Impaired by gross assessment (delayed recognition and difficulty locating precise location) Stereognosis: Not tested Hot/Cold: Not tested Proprioception: Appears Intact Additional Comments: continuous tingling and numbness entire Rt side Coordination Gross Motor Movements are Fluid and Coordinated: No Fine Motor Movements are Fluid and Coordinated: No Coordination and Movement Description: RUE dysmetria, slow, decreased accuracy Finger Nose Finger Test: slow, decreased accuracy Motor    Mobility     Trunk/Postural Assessment     Balance   Extremity/Trunk Assessment RUE Assessment RUE Assessment: Exceptions to [facility] RUE AROM (degrees) RUE Overall AROM Comments: WFL, shoulder with increased ROM and strength RUE PROM (degrees) RUE Overall PROM Comments: WFL RUE Strength RUE Overall Strength  Comments: grossly 4/5 overall except decreased grip strength LUE Assessment LUE Assessment: Within Functional Limits  See FIM for current functional status  Leonette Monarch 03/02/2011, 3:45 PM

## 2011-03-02 NOTE — Progress Notes (Signed)
Physical Therapy Discharge Summary and Session Note  Patient Details  Name: Jonathon [name] MRN: [id number] Date of Birth: [birth year] Today's Date: 03/02/2011  Patient has met 8 of 8 long term goals due to improved activity tolerance, improved balance and improved postural control.  Patient to discharge at an ambulatory level, Supervision.   Patient's care partner is independent to provide the necessary cognitive assistance at discharge. Pt issued HEP of calf raises and toe raises, and standing HC stretch,  to facilitate postural responses.   Recommendation:  Patient will benefit from ongoing skilled PT services in outpatient setting to continue to advance safe functional mobility, address ongoing impairments in motor control, strength, balance, flexibility, activity tolerance and minimize fall risk.  Equipment: RW  Reasons for discharge: treatment goals met  Patient/family agrees with progress made and goals achieved: Yes  PT Discharge Precautions/Restrictions Precautions Precautions: Fall Precaution Comments: falls, monitor BP Restrictions Weight Bearing Restrictions: No Vital Signs: BP at rest 165/10; PB after mild exercise = 165/104.    Pt has orders for no strenuous activity if BP> 165/100   Pain Pain Assessment Pain Assessment: No/denies pain Vision/Perception  Vision - History Baseline Vision: Wears glasses all the time Patient Visual Report: No change from baseline Vision - Assessment Eye Alignment: Within Functional Limits Perception Perception: Within Functional Limits Praxis Praxis: Intact  Cognition Overall Cognitive Status: Appears within functional limits for tasks assessed Arousal/Alertness: Awake/alert Orientation Level: Oriented X4 Attention: Selective Sensation Sensation Light Touch: Impaired by gross assessment (delayed recognition and difficulty locating precise location) Stereognosis: Not tested Hot/Cold: Not tested Proprioception: impaired R  ankle Additional Comments: continuous tingling and numbness entire Rt side  Motor Coordination Gross Motor Movements are Fluid and Coordinated: No Fine Motor Movements are Fluid and Coordinated: No Coordination and Movement Description: RUE dysmetria, slow, decreased accuracy Finger Nose Finger Test: slow, decreased accuracy   Motor Motor: Ataxia Motor - Skilled Clinical Observations: mild ataxia, RLE Motor - Discharge Observations: improved at d/c  Mobility   Locomotion  Ambulation Ambulation/Gait Assistance: 5: Supervision  Trunk/Postural Assessment     Balance- Berg as of 03/01/11= 41/56   Extremity Assessment  RUE Assessment RUE Assessment: Exceptions to [facility] RUE AROM (degrees) RUE Overall AROM Comments: WFL, shoulder with increased ROM and strength RUE PROM (degrees) RUE Overall PROM Comments: WFL RUE Strength RUE Overall Strength Comments: grossly 4/5 overall except decreased grip strength LUE Assessment LUE Assessment: Within Functional Limits RLE assessment, exceptions to [facility]- strength knee and hip 4-/5; ankle DF 5/5; continues with tight HC LLE assessment, WFLs, strength grossly 5/5  Treatment session- generalized strengthening for increased activity tolerance and improved balance reactions. Neuromuscular re-education RLE for improved motor control for graded movements in standing, as well as dressing in sitting.  Gait training without AD x 200' x 1, focusing on increased cadence, visual scanning of environment.  Wife trained in walking with pt, standing on his R side, holding hands.      See FIM for current functional status  [name] 03/02/2011, 5:26 PM

## 2011-03-02 NOTE — Plan of Care (Signed)
Problem: RH Car Transfers Goal: LTG Patient will perform car transfers with assist (PT) LTG: Patient will perform car transfers with assistance (PT).  Outcome: Completed/Met Date Met:  03/02/11 Discussion only.

## 2011-03-02 NOTE — Progress Notes (Signed)
Patient ID: Jonathon [name], male   DOB: 11-25-1950, 61 y.o.   MRN: [id number] Met with pt and spouse to report on team conference. Both in agreement with new discharge date of 03/03/11. Explained importance of following up with pt's PCP re: BP and med monitoring. Also reminded pt's wife that his Medicaid expires end of this month. She reports that she will re-apply. Both expressed understanding of need for f/u and compliance with meds.

## 2011-03-02 NOTE — Progress Notes (Signed)
Patient Details  Name: CAMREN LIPSETT MRN: [id number] Date of Birth: 05-Oct-1950  Today's Date: 03/02/2011 Time: 1310-1356 Pt participated in dynamic gait and balance group addressing gait x 200' with supervision without assistive device, ascending and descending steps with rail, ramp and curb with supervision. Dynamic balance activities included standing on foam balance board and wedge while tossing a ball and playing shuffle board with supervision. Pt benefited from group session for socialization and to challenge therapy skills into a community type environment.     Therapy/Group: Group Therapy  Activity Level: Moderate:  Level of assist: Supervision  Junior Kenedy 03/02/2011, 5:41 PM

## 2011-03-02 NOTE — Patient Care Conference (Signed)
Inpatient RehabilitationTeam Conference Note Date: 03/02/2011   Time: 10:30 AM   Patient Name: Jonathon [name]      Medical Record Number: [id number]  Date of Birth: Jun 02, 1950 Sex: Male         Room/Bed: 4036/4036-01 Payor Info: Payor: MEDICAID [place]  Plan: MEDICAID [place] ACCESS  Product Type: *No Product type*     Admitting Diagnosis: LT CVA  Admit Date/Time:  02/22/2011  4:59 PM Admission Comments: No comment available   Primary Diagnosis:  Pontine hemorrhage Principal Problem: Pontine hemorrhage  Patient Active Problem List  Diagnoses Date Noted  . Pontine hemorrhage 02/23/2011  . HTN (hypertension) 02/23/2011    Expected Discharge Date: Expected Discharge Date: 03/03/11  Team Members Present: Physician: Dr. Claudette Laws Case Manager Present: Lutricia Horsfall, RN Social Worker Present: Dossie Der, LCSW PT Present: Illene Bolus, Varney Biles, PT OT Present: Leonette Monarch, Felipa Eth, OT SLP Present: Fae Pippin, SLP Orvan Seen, RN     Current Status/Progress Goal Weekly Team Focus  Medical   Hypertension, right-sided paresthesias  Improved blood pressure, improve paresthesia  Adjust medications   Bowel/Bladder   Continent B&B, WNL  WNL      Swallow/Nutrition/ Hydration             ADL's   mod I bathing, min A UB and LB dressing, supervision transfers  supervision overall, mod I toileting  decreased reliance on wife for everything, increasing independence with functional tasks, d/c planning   Mobility   mod I tranfers;  supervision gait x 150' with RW  mod I tranfers; supervision gait x 150'  pt and family ed   Communication             Safety/Cognition/ Behavioral Observations            Pain   No c/o pain         Skin   intact  intact         *See Interdisciplinary Assessment and Plan and progress notes for long and short-term goals  Barriers to Discharge: Losing insurance making postoperative followup more difficult      Possible Resolutions to Barriers:  Improved status as much as possible while inpatient    Discharge Planning/Teaching Needs:  Home with wife who can provide 24 hr supervision, staying here with pt now.      Team Discussion:  Adjusting BP medication -- monitoring BP. Check BMET in AM. Pt needs F/U PCP arranged.   Revisions to Treatment Plan:  Change d/c date to 03/03/11   Continued Need for Acute Rehabilitation Level of Care: The patient requires daily medical management by a physician with specialized training in physical medicine and rehabilitation for the following conditions: Daily direction of a multidisciplinary physical rehabilitation program to ensure safe treatment while eliciting the highest outcome that is of practical value to the patient.: Yes Daily medical management of patient stability for increased activity during participation in an intensive rehabilitation regime.: Yes Daily analysis of laboratory values and/or radiology reports with any subsequent need for medication adjustment of medical intervention for : Neurological problems  Meryl Dare 03/03/2011, 2:14 PM

## 2011-03-02 NOTE — Progress Notes (Signed)
Physical Therapy Note  Patient Details  Name: Jonathon [name] MRN: [id number] Date of Birth: 1950-01-24 Today's Date: 03/02/2011  Time:  13:01- 13:56 55 minutes  Pain:  None  Group Therapy/Skilled Interventions:  Pt participated in dynamic gait and balance group addressing gait x 200' with supervision without assistive device, ascending and descending steps with rail, ramp and curb with supervision.  Dynamic balance activities included standing on foam balance board and wedge while tossing a ball and playing shuffle board with supervision.  Pt benefited from group session for socialization and to challenge therapy skills into a community type environment.   Georges Mouse 03/02/2011, 3:47 PM

## 2011-03-02 NOTE — Progress Notes (Signed)
Occupational Therapy Session Note  Patient Details  Name: Jonathon [name] MRN: [id number] Date of Birth: March 01, 1950  Today's Date: 03/02/2011 Time: 1308-6578 and 1410-1455 Time Calculation (min): 40 min and 45 min  Short Term Goals: No short term goals set  Skilled Therapeutic Interventions/Progress Updates:    Pt with increased BP prior to session so engaged in light self-care and home maintenance tasks.  Pt had completed bathing and dressing prior to session and reports that his wife did not assist with LB dressing.  Discussed tub/shower transfer and suggested use of tubbench to increase safety and independence, pt and wife agreeable.  Pt demonstrated safety with simulated tub/shower transfer.  Discussed use of RW with all functional mobility and transfers to increase safety.  Encouraged pt to use RUE with functional tasks.  2) Engaged in therapeutic exercise with focus on challenging dynamic standing balance, BUE use in functional tasks, RUE FMC, and forced use of RUE.  Pt with increased balance reactions and ability to bend towards the floor to obtain items without LOB.  Pt required 2 rest breaks during session.  Pt demonstrating increased functional use of RUE with encouragement.  Therapy Documentation Precautions:  Precautions Precautions: Fall Precaution Comments: falls, monitor BP Required Braces or Orthoses: No Restrictions Weight Bearing Restrictions: No Other Position/Activity Restrictions: monitor BP, no strenuous activity is systolic > 165 and diastolic > 100 General:   Vital Signs: Therapy Vitals Pulse Rate: 70  BP: 163/101 mmHg Patient Position, if appropriate: Sitting Pain: Pain Assessment Pain Assessment: 0-10 Pain Score:   3 Pain Type: Acute pain Pain Location: Arm Pain Orientation: Right Pain Descriptors: Aching;Sore Pain Intervention(s): RN premedicated  See FIM for current functional status  Therapy/Group: Individual Therapy  Leonette Monarch 03/02/2011, 12:30 PM

## 2011-03-02 NOTE — Progress Notes (Signed)
Social Work Patient ID: Jonathon [name], male   DOB: 19-Apr-1950, 61 y.o.   MRN: [id number]  Met with pt and wife to discuss discharge needs.  Agreeable to Outpatient Rehab and have made arrangements With [facility] for OPPT, OPOT follow up.  Referral made for rolling walker, and tub bench via [facility]. Wife to follow up with [place] DSS to see if Medicaid could be extended since it supposely ends the end of February. Discussed Social Security Disability and have given them an application to complete.  Aware of [facility] resources. Pt is very pleased with his progress and is ready to return home.  Wife has completed family education and is comfortable with pt's care. Set for discharge tomorrow.  Appt made with pt's primary MD-Dr Everlene Other 2/20 11:15

## 2011-03-02 NOTE — Progress Notes (Signed)
Patient ID: Jonathon [name], male   DOB: June 20, 1950, 61 y.o.   MRN: [id number] Subjective/Complaints:   complains of numb skin sensation.  Entire R side bothers him and awakens him Review of Systems  Eyes: Positive for double vision.  Neurological: Positive for sensory change. Dizziness: R hemibody.  All other systems reviewed and are negative.    Objective: Vital Signs: Blood pressure 162/100, pulse 62, temperature 98.1 F (36.7 C), temperature source Oral, resp. rate 17, height 6' (1.829 m), weight 91.309 kg (201 lb 4.8 oz), SpO2 95.00%. No results found. Results for orders placed during the hospital encounter of 02/22/11 (from the past 72 hour(s))  GLUCOSE, CAPILLARY     Status: Abnormal   Collection Time   02/27/11  9:40 PM      Component Value Range Comment   Glucose-Capillary 154 (*) 70 - 99 (mg/dL)    Comment 1 Notify RN      Comment 2 Documented in Chart     CBC     Status: Normal   Collection Time   02/28/11  7:00 AM      Component Value Range Comment   WBC 8.5  4.0 - 10.5 (K/uL)    RBC 5.16  4.22 - 5.81 (MIL/uL)    Hemoglobin 15.3  13.0 - 17.0 (g/dL)    HCT 62.9  52.8 - 41.3 (%)    MCV 89.3  78.0 - 100.0 (fL)    MCH 29.7  26.0 - 34.0 (pg)    MCHC 33.2  30.0 - 36.0 (g/dL)    RDW 24.4  01.0 - 27.2 (%)    Platelets 216  150 - 400 (K/uL)   DIFFERENTIAL     Status: Abnormal   Collection Time   02/28/11  7:00 AM      Component Value Range Comment   Neutrophils Relative 51  43 - 77 (%)    Neutro Abs 4.3  1.7 - 7.7 (K/uL)    Lymphocytes Relative 34  12 - 46 (%)    Lymphs Abs 2.9  0.7 - 4.0 (K/uL)    Monocytes Relative 13 (*) 3 - 12 (%)    Monocytes Absolute 1.1 (*) 0.1 - 1.0 (K/uL)    Eosinophils Relative 2  0 - 5 (%)    Eosinophils Absolute 0.2  0.0 - 0.7 (K/uL)    Basophils Relative 1  0 - 1 (%)    Basophils Absolute 0.1  0.0 - 0.1 (K/uL)   BASIC METABOLIC PANEL     Status: Abnormal   Collection Time   02/28/11  7:00 AM      Component Value Range Comment   Sodium 140  135 - 145 (mEq/L)    Potassium 3.9  3.5 - 5.1 (mEq/L)    Chloride 102  96 - 112 (mEq/L)    CO2 26  19 - 32 (mEq/L)    Glucose, Bld 112 (*) 70 - 99 (mg/dL)    BUN 18  6 - 23 (mg/dL)    Creatinine, Ser 5.36  0.50 - 1.35 (mg/dL)    Calcium 9.5  8.4 - 10.5 (mg/dL)    GFR calc non Af Amer 87 (*) >90 (mL/min)    GFR calc Af Amer >90  >90 (mL/min)      Gen:  NAD Mood/affect : Approp Lungs:  Clear Cor:  RRR no Murmur Abd:  + BS soft NT Ext:  -C/C/[name] Sensation:  Reduce abil;ity to distinguish Lt touch, + hyperesthesia Motor 5/5 in L Bi,Delt, tri  grip, HF, KE, ADF 4/5 in R Delt,Bi,Tri,Grip, HF, KE, ADF CN:  Diplopia with R lat gaze, partial INO Ataxia noted in RUE and RLE  Assessment/Plan: 1. Functional deficits secondary to L dorsal pontine ICH which require 3+ hours per day of interdisciplinary therapy in a comprehensive inpatient rehab setting. Physiatrist is providing close team supervision and 24 hour management of active medical problems listed below. Physiatrist and rehab team continue to assess barriers to discharge/monitor patient progress toward functional and medical goals. FIM: FIM - Bathing Bathing Steps Patient Completed: Chest;Right Arm;Left Arm;Abdomen;Front perineal area;Buttocks;Right upper leg;Left upper leg;Right lower leg (including foot);Left lower leg (including foot) Bathing: 6: Assistive device (Comment)  FIM - Upper Body Dressing/Undressing Upper body dressing/undressing steps patient completed: Thread/unthread right sleeve of pullover shirt/dresss;Thread/unthread left sleeve of pullover shirt/dress;Put head through opening of pull over shirt/dress Upper body dressing/undressing: 4: Min-Patient completed 75 plus % of tasks FIM - Lower Body Dressing/Undressing Lower body dressing/undressing steps patient completed: Thread/unthread right underwear leg;Thread/unthread left underwear leg;Pull underwear up/down;Thread/unthread right pants leg;Thread/unthread  left pants leg;Pull pants up/down;Don/Doff left sock;Don/Doff right shoe;Don/Doff left shoe Lower body dressing/undressing: 4: Min-Patient completed 75 plus % of tasks  FIM - Toileting Toileting steps completed by patient: Adjust clothing prior to toileting;Performs perineal hygiene;Adjust clothing after toileting Toileting Assistive Devices: Grab bar or rail for support Toileting: 6: Assistive device: No helper  FIM - Diplomatic Services operational officer Devices: Grab bars Toilet Transfers: 4-To toilet/BSC: Min A (steadying Pt. > 75%)  FIM - Bed/Chair Transfer Bed/Chair Transfer Assistive Devices: Therapist, occupational:  (mod I in gym with UE use)  FIM - Locomotion: Wheelchair Locomotion: Wheelchair: 0: Activity did not occur FIM - Locomotion: Ambulation Locomotion: Ambulation Assistive Devices: Designer, industrial/product Ambulation/Gait Assistance: 5: Supervision;4: Min assist Locomotion: Ambulation: 4: Travels 150 ft or more with minimal assistance (Pt.>75%) (min A without device, S with RW)  Comprehension Comprehension Mode: Auditory Comprehension: 7-Follows complex conversation/direction: With no assist  Expression Expression Mode: Verbal Expression: 7-Expresses complex ideas: With no assist  Social Interaction Social Interaction Mode: Asleep Social Interaction: 7-Interacts appropriately with others - No medications needed.  Problem Solving Problem Solving Mode: Asleep Problem Solving: 7-Solves complex problems: Recognizes & self-corrects  Memory Memory Mode: Asleep Memory: 5-Recognizes or recalls 90% of the time/requires cueing < 10% of the time  Medical Problem List and Plan:  1. DVT Prophylaxis/Anticoagulation: Mechanical: Sequential compression devices, below knee Bilateral lower extremities  2. Pain Management: has had neuropathic pain right hemibody. Neurontin on boardless pain still numb of course.R shoulder pain when he sleeps on it.  Add nortriptyline 3.  Mood: some anxiety especially regarding dysesthesias. Ego support offered. Will monitor for now.  4.HTN: Monitor with bid checks. . Watch renal status with ace and diuretic on board. May need further increase.Check BMET 2/11 looks ok.  Increase lisinopril 5. COPD: No complaints of SOB or cough. No chronic medications.  6. CHF: Continue HCTZ. Monitor for signs of overload. Heart healthy diet. Check weights  7. Hypokalemia: Currently on supplement. ? Dilutional. Recheck 2/11  8. Tobacco use: Educated patient stroke risk. Continue nicotine patch. Patient (and wife) smoke and not interested in quitting right now  LOS (Days) 8 A FACE TO FACE EVALUATION WAS PERFORMED  Jonathon [name] 03/02/2011, 8:44 AM

## 2011-03-03 DIAGNOSIS — I69993 Ataxia following unspecified cerebrovascular disease: Secondary | ICD-10-CM

## 2011-03-03 DIAGNOSIS — Z5189 Encounter for other specified aftercare: Secondary | ICD-10-CM

## 2011-03-03 DIAGNOSIS — I6330 Cerebral infarction due to thrombosis of unspecified cerebral artery: Secondary | ICD-10-CM

## 2011-03-03 MED ORDER — HYDROCHLOROTHIAZIDE 25 MG PO TABS: 25.0000 mg | ORAL_TABLET | Freq: Every day | ORAL | Status: DC

## 2011-03-03 MED ORDER — POTASSIUM CHLORIDE CRYS ER 20 MEQ PO TBCR: 20.0000 meq | EXTENDED_RELEASE_TABLET | Freq: Two times a day (BID) | ORAL | Status: DC

## 2011-03-03 MED ORDER — NICOTINE 14 MG/24HR TD PT24: 1.0000 | MEDICATED_PATCH | TRANSDERMAL | Status: AC

## 2011-03-03 MED ORDER — NORTRIPTYLINE HCL 25 MG PO CAPS: 25.0000 mg | ORAL_CAPSULE | Freq: Every day | ORAL | Status: DC

## 2011-03-03 MED ORDER — SENNOSIDES-DOCUSATE SODIUM 8.6-50 MG PO TABS: 1.0000 | ORAL_TABLET | Freq: Two times a day (BID) | ORAL | Status: DC

## 2011-03-03 MED ORDER — LISINOPRIL 20 MG PO TABS: 20.0000 mg | ORAL_TABLET | Freq: Two times a day (BID) | ORAL | Status: DC

## 2011-03-03 MED ORDER — GABAPENTIN 400 MG PO CAPS: 800.0000 mg | ORAL_CAPSULE | Freq: Three times a day (TID) | ORAL | Status: DC

## 2011-03-03 MED ORDER — TRAZODONE 25 MG HALF TABLET: 50.0000 mg | ORAL_TABLET | Freq: Every evening | ORAL | Status: DC | PRN

## 2011-03-03 NOTE — Progress Notes (Signed)
[facility] Discharge Summary Patient Details  Name: Jonathon [name] MRN: [id number] Date of Birth: 08-Sep-1950  Long term goals set: 1  Long term goals met: 1  Comments on progress toward goals: Pt met supervision level for TR tasks focusing on dynamic standing balance, safety, activity tolerance, and community reintegration.  Pt met goal and is ready for discharge home today with wife.  Reasons goals not met: n/a  Equipment acquired: n/a  Reasons for discharge: discharge from hospital  Patient/family agrees with progress made and goals achieved: Yes  [name] 03/03/2011, 7:57 AM

## 2011-03-03 NOTE — Progress Notes (Signed)
Patient and wife given discharge information by Marina Goodell- all questions answered. Patient assisted to car by nurse tech, and belongings carried out by wife

## 2011-03-03 NOTE — Progress Notes (Signed)
Patient ID: Jonathon [name], male   DOB: Aug 23, 1950, 61 y.o.   MRN: [id number] Subjective/Complaints:   complains of numb skin sensation.  Did not awaken last noc, received Nortriptyline  Review of Systems  Eyes: Positive for double vision.  Neurological: Positive for sensory change. Dizziness: R hemibody.  All other systems reviewed and are negative.    Objective: Vital Signs: Blood pressure 145/91, pulse 58, temperature 98.5 F (36.9 C), temperature source Oral, resp. rate 18, height 6' (1.829 m), weight 91.309 kg (201 lb 4.8 oz), SpO2 92.00%. No results found. Results for orders placed during the hospital encounter of 02/22/11 (from the past 72 hour(s))  CBC     Status: Normal   Collection Time   02/28/11  7:00 AM      Component Value Range Comment   WBC 8.5  4.0 - 10.5 (K/uL)    RBC 5.16  4.22 - 5.81 (MIL/uL)    Hemoglobin 15.3  13.0 - 17.0 (g/dL)    HCT 40.9  81.1 - 91.4 (%)    MCV 89.3  78.0 - 100.0 (fL)    MCH 29.7  26.0 - 34.0 (pg)    MCHC 33.2  30.0 - 36.0 (g/dL)    RDW 78.2  95.6 - 21.3 (%)    Platelets 216  150 - 400 (K/uL)   DIFFERENTIAL     Status: Abnormal   Collection Time   02/28/11  7:00 AM      Component Value Range Comment   Neutrophils Relative 51  43 - 77 (%)    Neutro Abs 4.3  1.7 - 7.7 (K/uL)    Lymphocytes Relative 34  12 - 46 (%)    Lymphs Abs 2.9  0.7 - 4.0 (K/uL)    Monocytes Relative 13 (*) 3 - 12 (%)    Monocytes Absolute 1.1 (*) 0.1 - 1.0 (K/uL)    Eosinophils Relative 2  0 - 5 (%)    Eosinophils Absolute 0.2  0.0 - 0.7 (K/uL)    Basophils Relative 1  0 - 1 (%)    Basophils Absolute 0.1  0.0 - 0.1 (K/uL)   BASIC METABOLIC PANEL     Status: Abnormal   Collection Time   02/28/11  7:00 AM      Component Value Range Comment   Sodium 140  135 - 145 (mEq/L)    Potassium 3.9  3.5 - 5.1 (mEq/L)    Chloride 102  96 - 112 (mEq/L)    CO2 26  19 - 32 (mEq/L)    Glucose, Bld 112 (*) 70 - 99 (mg/dL)    BUN 18  6 - 23 (mg/dL)    Creatinine, Ser 0.86   0.50 - 1.35 (mg/dL)    Calcium 9.5  8.4 - 10.5 (mg/dL)    GFR calc non Af Amer 87 (*) >90 (mL/min)    GFR calc Af Amer >90  >90 (mL/min)      Gen:  NAD Mood/affect : Approp Lungs:  Clear Cor:  RRR no Murmur Abd:  + BS soft NT Ext:  -C/C/E Sensation:  Reduce abil;ity to distinguish Lt touch, - hyperesthesia Motor 5/5 in L Bi,Delt, tri grip, HF, KE, ADF 4/5 in R Delt,Bi,Tri,Grip, HF, KE, ADF CN:  Diplopia with R lat gaze, partial INO Ataxia noted in RUE and RLE  Assessment/Plan: 1. Functional deficits secondary to L dorsal pontine ICH stable for D/C     FIM: FIM - Bathing Bathing Steps Patient Completed: Chest;Right Arm;Left Arm;Abdomen;Front perineal  area;Buttocks;Right upper leg;Left upper leg;Right lower leg (including foot);Left lower leg (including foot) Bathing: 0: Activity did not occur  FIM - Upper Body Dressing/Undressing Upper body dressing/undressing steps patient completed: Thread/unthread right sleeve of pullover shirt/dresss;Thread/unthread left sleeve of pullover shirt/dress;Put head through opening of pull over shirt/dress;Pull shirt over trunk Upper body dressing/undressing: 0: Activity did not occur FIM - Lower Body Dressing/Undressing Lower body dressing/undressing steps patient completed: Thread/unthread right underwear leg;Thread/unthread left underwear leg;Pull underwear up/down;Thread/unthread right pants leg;Thread/unthread left pants leg;Pull pants up/down;Don/Doff right sock;Don/Doff left sock Lower body dressing/undressing: 0: Activity did not occur  FIM - Toileting Toileting steps completed by patient: Adjust clothing prior to toileting;Performs perineal hygiene;Adjust clothing after toileting Toileting Assistive Devices: Grab bar or rail for support Toileting: 0: Activity did not occur  FIM - Diplomatic Services operational officer Devices: Grab bars Toilet Transfers: 0-Activity did not occur  FIM - Landscape architect Devices: Therapist, occupational: 0: Activity did not occur  FIM - Locomotion: Wheelchair Locomotion: Wheelchair: 0: Activity did not occur FIM - Locomotion: Ambulation Locomotion: Ambulation Assistive Devices: Designer, industrial/product Ambulation/Gait Assistance: 5: Supervision Locomotion: Ambulation: 5: Travels 150 ft or more with supervision/safety issues  Comprehension Comprehension Mode: Auditory Comprehension: 7-Follows complex conversation/direction: With no assist  Expression Expression Mode: Verbal Expression: 5-Expresses basic needs/ideas: With no assist  Social Interaction Social Interaction Mode: Asleep Social Interaction: 7-Interacts appropriately with others - No medications needed.  Problem Solving Problem Solving Mode: Asleep Problem Solving: 7-Solves complex problems: Recognizes & self-corrects  Memory Memory Mode: Asleep Memory: 6-Assistive device: No helper  Medical Problem List and Plan:  1. DVT Prophylaxis/Anticoagulation:discontinue        2. Pain Management: has had neuropathic pain right hemibody. Neurontin on boardless pain still numb of course.R shoulder pain when he sleeps on it.  Add nortriptyline 3. Mood: some anxiety especially regarding dysesthesias. Ego support offered. Will monitor for now.  4.HTN: Monitor with bid checks. . Watch renal status with ace and diuretic on board. May need further increase.Increase lisinopril has helped BP 5. COPD: No complaints of SOB or cough. No chronic medications.  6. CHF: Continue HCTZ.  Heart healthy diet. Check weights Well compensated    7. Hypokalemia: Currently on supplement.needs KCL for HCTZ     8. Tobacco use: Educated patient stroke risk. Continue nicotine patch. Patient (and wife) smoke and not interested in quitting right now  LOS (Days) 9 A FACE TO FACE EVALUATION WAS PERFORMED  Jonathon [name] E 03/03/2011, 6:53 AM

## 2011-03-05 NOTE — Discharge Summary (Signed)
NAMETIMOTEO, CARREIRO NO.:  [id number]  MEDICAL RECORD NO.:  [id number]  LOCATION:  4036                         FACILITY:  [facility]  PHYSICIAN:  Erick Colace, M.D.DATE OF BIRTH:  [birth year]  DATE OF ADMISSION:  02/22/2011 DATE OF DISCHARGE:  03/03/2011                              DISCHARGE SUMMARY   DISCHARGE DIAGNOSES: 1. Left brain stem hemorrhage. 2. Hypertension. 3. Post stroke neuropathy. 4. Chronic obstructive pulmonary disease. 5. Hypokalemia, resolved.  HISTORY OF PRESENT ILLNESS:  Jonathon [name] is a 61 year old, right-handed male with history of hypertension as well as CHF and noncompliance with medications who was admitted February 17, 2011, with right-sided weakness and slurred speech, as well as mental status changes with vomiting.  CT of head done showed acute left brain stem hemorrhage centered in pons without mass effect.  Blood pressure is elevated at admission at 161/143.  The patient was placed on Cardene drip and Neurosurgery was consulted for input.  Dr. Danielle Dess consulted conservative care and management of blood pressure.  MRI of brain done, revealing left paracentral posterior pontine hemorrhage extending to inferior aspect of left mid brain with surrounding vasogenic edema, global atrophy and moderate small-vessel disease.  Carotid Dopplers done showed no ICA stenosis.  An 2D echo done showed EF of 60% with grade 1 diastolic dysfunction.  Neurology was consulted for input and has been following.  The patient with complaints of neuropathy and he was started on Neurontin and Cymbalta added additionally.  However, this was discontinued due to BP elevation.  Therapies were initiated and the patient currently continues with right-sided weakness with neuropathy, right facial weakness, as well as slight ataxic gait with impaired balance as well as anxiety with mobility.  Rehab was consulted and we felt that the patient would  benefit from a CIR program.  REVIEW OF SYSTEMS:  Positive for blurred vision in right eye.  Negative for cough, shortness of breath, chest pain or palpitations.  Positive for issues with heartburn.  Negative for dysuria or urgency.  The patient questions history of some testicular mass.  Neurologic, the patient with complaints of numbness, right hemibody with dysesthesias as well as tingling.  Complains of dizziness as well as focal weakness, right side.  Reports issues with decreased memory.  PAST MEDICAL HISTORY:  CHF, COPD and hypertension.  PAST SURGICAL HISTORY:  Right thoracotomy for pneumothorax as well as left inguinal hernia repair.  FAMILY HISTORY:  Mother with history of hypertension.  SOCIAL HISTORY:  The patient is married, is independent and self- employed.  He smokes 1 pack per day.  Does not have a history of smokeless tobacco use.  He uses a shot of whiskey couple times a week. Does not use illicit drugs.  Wife is supportive and can assist past discharge.  ALLERGIES:  No known drug allergies.  Home is multi-level with 5 steps at entry.  FUNCTIONAL HISTORY:  The patient was independent with basic ADLs.  FUNCTIONAL STATUS:  The patient is min assist for bed mobility and transfers with cuing requires for sequencing.  He has required +2 total assist for ambulating 6 feet with bilateral handheld assist with slightly ataxic stance on the right.  He  is at supervision for upper body bathing and min assist for dressing.  He requires mod assist for lower body bathing and dressing.  Min assist for toileting.  PHYSICAL EXAMINATION:  VITAL SIGNS:  Blood pressure 171/95, pulse 61, temperature 98.1 and respiratory rate 18. GENERAL:  The patient is well-nourished, well-developed male, in no acute distress. HEENT:  Pupils equal, round and reactive to light.  Oral mucosa is pink and moist with fair dentition.  Atraumatic, normocephalic. NECK:  Supple with normal range of  motion. CARDIOVASCULAR:  Normal rate and regular rhythm. PULMONARY:  Respiratory effort normal.  Breath sounds normal. ABDOMEN:  Soft and nontender. MUSCULOSKELETAL:  Normal range of motion. NEUROLOGIC:  The patient is alert and oriented x3.  Cranial nerve deficit is present.  Coordination is abnormal.  Blurry vision on right improving.  The patient with right facial and tongue sensory impairment. Sensation is 1/2 right arm, 1+/2 right leg.  Strength is grossly 3-4/5 with diminished fine motor coordination.  Right lower extremity is grossly 4- to 4/5 with some diminishment of fine motor movements. Speech is clear.  Cognitively, he is intact.  Reflexes are 1+. SKIN:  Warm and dry. PSYCHIATRIC:  Memory, affect and judgment are normal.  HOSPITAL COURSE:  Jonathon [name] was admitted to Rehab on February 22, 2011, for inpatient therapies to consist of PT, OT at least 3 hours 5 days a week.  Past admission, physiatrist, rehab, RN, and therapy team have worked together to provide customized collaborative interdisciplinary care.  Rehab RN has worked with the patient on bowel and bladder program as well as safety.  The patient has had complaints of neuropathy with numbness.  His Neurontin was adjusted and slowly Increased. Additionally, nortriptyline was added with some improvement in his symptoms.  The patient's blood pressures were monitored on b.i.d. basis and these were noted to be labile.  His medications have slowly been titrated upwards.  Blood pressure at time of discharge is at 145/91.  The patient has had routine labs checked during this stay.  The patient was noted to be hypokalemic due to diuretic on board.  He was started on potassium supplement.  Check of lytes from February 11, shows hypokalemia to have resolved with sodium 140, potassium 3.9, chloride 102, CO2 26, BUN 18, creatinine 0.98, glucose 112.  CBC reveals hemoglobin 15.3, hematocrit 46.1, white count 8.5,  platelets 216.  During patient's stay in rehab, weekly team conferences were held to monitor the patient's progress, set goals, as well as discuss barriers to discharge.  At admission, the patient required mod assist with mobility with Berg balance score of 21/56, with impaired timing and sequencing of muscle activation.  He was also noted to have ataxia of trunk and right lower extremity with right lower extremity dysmetria, delayed balance reaction and significantly decreased activity tolerance. OT evaluation revealed the patient requiring min to mod assist for basic self-care and ADLs due to his muscle weakness, decreased cardiorespiratory endurance, ataxia, as well as decreased visual activity with decreased coordination and decreased standing balance.  OT has worked with the patient with focus on challenging dynamic standing balance, as well as bilateral upper extremity use and functional tasks. Right upper extremity fine motor control with forced use of right upper extremity.  The patient has progressed to being at supervision level for ADLs.  The patient requires encouragement to attempt tasks with increase independence as he has learned to depend on his wife to assist him with many  Of these  tasks.  Wife was educated on encouraging the patient to attempt tasks before asking for assistance as the patient is usually able to complete tasks independently.  The patient has progressed along in his mobility and he is at supervision level for ambulating 200 feet without assisted device.  Supervision level for ascending and descending steps with rail, ramp and curb.  The patient's wife is able to perform necessary cognitive assistance past discharge.  Additionally, the patient has been issued with home exercise plan for raises, toe raises, and standing HC stretch to facilitate postural risk for falls.  He is set up to continue outpatient PT, OT at [facility] past  discharge.  DISCHARGE MEDICATIONS: 1. Gabapentin 400 mg 2 p.o. t.i.d. 2. Hydrochlorothiazide 25 mg a day. 3. Lisinopril 20 mg b.i.d. 4. Nicotine patch 14 mg changing daily for 3 weeks, then decrease to 7     mg daily for 4 weeks, then stop. 5. Nortriptyline 25 mg at bedtime. 6. K-Dur 20 mEq p.o. b.i.d. 7. Senokot 1 p.o. b.i.d. 8. Trazodone 25 mg at bedtime.  DIET INSTRUCTION:  Low fat, low cholesterol.  ACTIVITY LEVEL:  At 24-hours supervision.  Walks with use of walker.  No alcohol.  SPECIAL INSTRUCTIONS:  May ambulate in home with supervision.  No driving until cleared by Dr. Wynn Banker.  Neuro outpatient rehab, PT, OT, to begin on March 08, 2011, 10 a.m. to 11:15.  FOLLOWUP:  The patient to follow up with Dr. Anne Hahn with Dr. Pearlean Brownie in 6 weeks.  Follow up with Dr. Wynn Banker on March 25, 2011, at 9:30 a.m. Follow up with Dr. Everlene Other on March 09, 2011, at 11:15.     Delle Reining, P.A.   ______________________________ Erick Colace, [name]    PL/MEDQ  D:  03/04/2011  T:  03/05/2011  Job:  [id number]  cc:   Pramod P. Pearlean Brownie, MD Tracey Harries, [name]

## 2011-03-08 ENCOUNTER — Ambulatory Visit: Payer: Medicaid Other | Attending: Physical Medicine & Rehabilitation | Admitting: Physical Therapy

## 2011-03-08 DIAGNOSIS — R269 Unspecified abnormalities of gait and mobility: Secondary | ICD-10-CM | POA: Insufficient documentation

## 2011-03-08 DIAGNOSIS — IMO0001 Reserved for inherently not codable concepts without codable children: Secondary | ICD-10-CM | POA: Insufficient documentation

## 2011-03-08 DIAGNOSIS — I69998 Other sequelae following unspecified cerebrovascular disease: Secondary | ICD-10-CM | POA: Insufficient documentation

## 2011-03-08 DIAGNOSIS — M6281 Muscle weakness (generalized): Secondary | ICD-10-CM | POA: Insufficient documentation

## 2011-03-16 ENCOUNTER — Ambulatory Visit: Payer: Medicaid Other | Admitting: Physical Therapy

## 2011-03-16 ENCOUNTER — Encounter: Payer: Medicaid Other | Admitting: Occupational Therapy

## 2011-03-18 ENCOUNTER — Ambulatory Visit: Payer: Medicaid Other | Attending: Physical Medicine & Rehabilitation | Admitting: Physical Therapy

## 2011-03-18 ENCOUNTER — Ambulatory Visit: Payer: Medicaid Other | Admitting: Occupational Therapy

## 2011-03-18 DIAGNOSIS — Z5189 Encounter for other specified aftercare: Secondary | ICD-10-CM | POA: Insufficient documentation

## 2011-03-18 DIAGNOSIS — I69998 Other sequelae following unspecified cerebrovascular disease: Secondary | ICD-10-CM | POA: Insufficient documentation

## 2011-03-18 DIAGNOSIS — M6281 Muscle weakness (generalized): Secondary | ICD-10-CM | POA: Insufficient documentation

## 2011-03-18 DIAGNOSIS — R269 Unspecified abnormalities of gait and mobility: Secondary | ICD-10-CM | POA: Insufficient documentation

## 2011-03-18 DIAGNOSIS — R279 Unspecified lack of coordination: Secondary | ICD-10-CM | POA: Insufficient documentation

## 2011-03-22 ENCOUNTER — Encounter: Payer: Medicaid Other | Admitting: Occupational Therapy

## 2011-03-22 ENCOUNTER — Ambulatory Visit: Payer: Medicaid Other | Admitting: Physical Therapy

## 2011-03-25 ENCOUNTER — Encounter: Payer: Medicaid Other | Admitting: Occupational Therapy

## 2011-03-25 ENCOUNTER — Ambulatory Visit: Payer: Medicaid Other | Admitting: Physical Therapy

## 2011-03-25 ENCOUNTER — Inpatient Hospital Stay: Payer: Self-pay | Admitting: Physical Medicine & Rehabilitation

## 2011-03-29 ENCOUNTER — Encounter: Payer: Medicaid Other | Admitting: Occupational Therapy

## 2011-03-29 ENCOUNTER — Ambulatory Visit: Payer: Medicaid Other | Admitting: Physical Therapy

## 2011-04-01 ENCOUNTER — Ambulatory Visit: Payer: Medicaid Other | Admitting: Occupational Therapy

## 2011-04-01 ENCOUNTER — Ambulatory Visit: Payer: Medicaid Other | Admitting: Physical Therapy

## 2011-04-05 ENCOUNTER — Encounter: Payer: Medicaid Other | Admitting: Occupational Therapy

## 2011-04-05 ENCOUNTER — Ambulatory Visit: Payer: Medicaid Other | Admitting: Physical Therapy

## 2011-04-07 ENCOUNTER — Encounter: Payer: Self-pay | Admitting: Physical Medicine & Rehabilitation

## 2011-04-08 ENCOUNTER — Encounter: Payer: Medicaid Other | Admitting: Occupational Therapy

## 2011-04-08 ENCOUNTER — Ambulatory Visit: Payer: Medicaid Other | Admitting: Physical Therapy

## 2011-04-12 ENCOUNTER — Encounter (HOSPITAL_COMMUNITY): Payer: Self-pay | Admitting: *Deleted

## 2011-04-12 ENCOUNTER — Other Ambulatory Visit: Payer: Self-pay

## 2011-04-12 ENCOUNTER — Emergency Department (HOSPITAL_COMMUNITY): Payer: Medicaid Other

## 2011-04-12 ENCOUNTER — Ambulatory Visit: Payer: Medicaid Other | Admitting: Physical Therapy

## 2011-04-12 ENCOUNTER — Encounter: Payer: Medicaid Other | Admitting: Occupational Therapy

## 2011-04-12 ENCOUNTER — Observation Stay (HOSPITAL_COMMUNITY)
Admission: EM | Admit: 2011-04-12 | Discharge: 2011-04-14 | Disposition: A | Discharge status: Home / Self Care | Payer: Medicaid Other | Attending: Internal Medicine | Admitting: Internal Medicine

## 2011-04-12 DIAGNOSIS — R0602 Shortness of breath: Secondary | ICD-10-CM | POA: Insufficient documentation

## 2011-04-12 DIAGNOSIS — R29898 Other symptoms and signs involving the musculoskeletal system: Secondary | ICD-10-CM | POA: Insufficient documentation

## 2011-04-12 DIAGNOSIS — I509 Heart failure, unspecified: Secondary | ICD-10-CM | POA: Insufficient documentation

## 2011-04-12 DIAGNOSIS — J4489 Other specified chronic obstructive pulmonary disease: Secondary | ICD-10-CM | POA: Insufficient documentation

## 2011-04-12 DIAGNOSIS — F17200 Nicotine dependence, unspecified, uncomplicated: Secondary | ICD-10-CM | POA: Insufficient documentation

## 2011-04-12 DIAGNOSIS — R079 Chest pain, unspecified: Principal | ICD-10-CM | POA: Diagnosis present

## 2011-04-12 DIAGNOSIS — I69998 Other sequelae following unspecified cerebrovascular disease: Secondary | ICD-10-CM | POA: Insufficient documentation

## 2011-04-12 DIAGNOSIS — R51 Headache: Secondary | ICD-10-CM | POA: Insufficient documentation

## 2011-04-12 DIAGNOSIS — J449 Chronic obstructive pulmonary disease, unspecified: Secondary | ICD-10-CM | POA: Insufficient documentation

## 2011-04-12 DIAGNOSIS — J3800 Paralysis of vocal cords and larynx, unspecified: Secondary | ICD-10-CM | POA: Insufficient documentation

## 2011-04-12 LAB — BASIC METABOLIC PANEL
BUN: 13 mg/dL (ref 6–23)
CO2: 27 mEq/L (ref 19–32)
Calcium: 9 mg/dL (ref 8.4–10.5)
Chloride: 99 mEq/L (ref 96–112)
Creatinine, Ser: 1.02 mg/dL (ref 0.50–1.35)
GFR calc Af Amer: 90 mL/min — ABNORMAL LOW (ref >90)
GFR calc non Af Amer: 77 mL/min — ABNORMAL LOW (ref >90)
Glucose, Bld: 97 mg/dL (ref 70–99)
Potassium: 3.4 mEq/L — ABNORMAL LOW (ref 3.5–5.1)
Sodium: 135 mEq/L (ref 135–145)

## 2011-04-12 LAB — CBC
HCT: 46.2 % (ref 39.0–52.0)
HCT: 44.4 % (ref 39.0–52.0)
Hemoglobin: 16.1 g/dL (ref 13.0–17.0)
Hemoglobin: 15.7 g/dL (ref 13.0–17.0)
MCH: 30.3 pg (ref 26.0–34.0)
MCH: 30.8 pg (ref 26.0–34.0)
MCHC: 34.8 g/dL (ref 30.0–36.0)
MCHC: 35.4 g/dL (ref 30.0–36.0)
MCV: 86.8 fL (ref 78.0–100.0)
MCV: 87.2 fL (ref 78.0–100.0)
Platelets: 196 10*3/uL (ref 150–400)
Platelets: 202 10*3/uL (ref 150–400)
RBC: 5.32 MIL/uL (ref 4.22–5.81)
RBC: 5.09 MIL/uL (ref 4.22–5.81)
RDW: 14 % (ref 11.5–15.5)
RDW: 13.9 % (ref 11.5–15.5)
WBC: 8.5 10*3/uL (ref 4.0–10.5)
WBC: 9.6 10*3/uL (ref 4.0–10.5)

## 2011-04-12 LAB — CREATININE, SERUM
Creatinine, Ser: 1.13 mg/dL (ref 0.50–1.35)
GFR calc Af Amer: 79 mL/min — ABNORMAL LOW (ref >90)
GFR calc non Af Amer: 68 mL/min — ABNORMAL LOW (ref >90)

## 2011-04-12 LAB — TROPONIN I: Troponin I: <0.3 ng/mL (ref <0.30)

## 2011-04-12 LAB — CARDIAC PANEL(CRET KIN+CKTOT+MB+TROPI)
CK, MB: 2.4 ng/mL (ref 0.3–4.0)
Relative Index: INVALID (ref 0.0–2.5)
Total CK: 84 U/L (ref 7–232)
Troponin I: <0.3 ng/mL (ref <0.30)

## 2011-04-12 LAB — PRO B NATRIURETIC PEPTIDE: Pro B Natriuretic peptide (BNP): 96.5 pg/mL (ref 0–125)

## 2011-04-12 MED ORDER — ACETAMINOPHEN 325 MG PO TABS
650.0000 mg | ORAL_TABLET | Freq: Four times a day (QID) | ORAL | Status: DC | PRN
  Administered 2011-04-12: 650 mg via ORAL
  Filled 2011-04-12: qty 2

## 2011-04-12 MED ORDER — HYDRALAZINE HCL 20 MG/ML IJ SOLN
10.0000 mg | Freq: Three times a day (TID) | INTRAMUSCULAR | Status: DC | PRN
  Administered 2011-04-13: 10 mg via INTRAVENOUS
  Filled 2011-04-12: qty 1

## 2011-04-12 MED ORDER — SODIUM CHLORIDE 0.9 % IV SOLN: 250.0000 mL | INTRAVENOUS | Status: DC | PRN

## 2011-04-12 MED ORDER — ACETAMINOPHEN 650 MG RE SUPP: 650.0000 mg | Freq: Four times a day (QID) | RECTAL | Status: DC | PRN

## 2011-04-12 MED ORDER — NORTRIPTYLINE HCL 25 MG PO CAPS
25.0000 mg | ORAL_CAPSULE | Freq: Every day | ORAL | Status: DC
  Administered 2011-04-12 – 2011-04-13 (×2): 25 mg via ORAL
  Filled 2011-04-12 (×4): qty 1

## 2011-04-12 MED ORDER — SODIUM CHLORIDE 0.9 % IJ SOLN
3.0000 mL | Freq: Two times a day (BID) | INTRAMUSCULAR | Status: DC
  Administered 2011-04-12 – 2011-04-13 (×3): 3 mL via INTRAVENOUS

## 2011-04-12 MED ORDER — NITROGLYCERIN 0.4 MG SL SUBL: 0.4000 mg | SUBLINGUAL_TABLET | SUBLINGUAL | Status: DC | PRN

## 2011-04-12 MED ORDER — ASPIRIN EC 325 MG PO TBEC
325.0000 mg | DELAYED_RELEASE_TABLET | Freq: Every day | ORAL | Status: DC
  Administered 2011-04-13 – 2011-04-14 (×2): 325 mg via ORAL
  Filled 2011-04-12 (×2): qty 1

## 2011-04-12 MED ORDER — SODIUM CHLORIDE 0.9 % IJ SOLN: 3.0000 mL | INTRAMUSCULAR | Status: DC | PRN

## 2011-04-12 MED ORDER — SENNOSIDES-DOCUSATE SODIUM 8.6-50 MG PO TABS
1.0000 | ORAL_TABLET | Freq: Two times a day (BID) | ORAL | Status: DC
  Administered 2011-04-12 – 2011-04-13 (×2): 1 via ORAL
  Filled 2011-04-12 (×6): qty 1

## 2011-04-12 MED ORDER — POTASSIUM CHLORIDE CRYS ER 20 MEQ PO TBCR
40.0000 meq | EXTENDED_RELEASE_TABLET | Freq: Two times a day (BID) | ORAL | Status: DC
  Administered 2011-04-13 – 2011-04-14 (×3): 40 meq via ORAL
  Filled 2011-04-12 (×5): qty 2

## 2011-04-12 MED ORDER — DOCUSATE SODIUM 100 MG PO CAPS
100.0000 mg | ORAL_CAPSULE | Freq: Two times a day (BID) | ORAL | Status: DC
  Administered 2011-04-12 – 2011-04-13 (×2): 100 mg via ORAL
  Filled 2011-04-12 (×6): qty 1

## 2011-04-12 MED ORDER — METOPROLOL TARTRATE 50 MG PO TABS
50.0000 mg | ORAL_TABLET | Freq: Two times a day (BID) | ORAL | Status: DC
  Administered 2011-04-12 – 2011-04-14 (×3): 50 mg via ORAL
  Filled 2011-04-12 (×6): qty 1

## 2011-04-12 MED ORDER — ENOXAPARIN SODIUM 40 MG/0.4ML ~~LOC~~ SOLN
40.0000 mg | SUBCUTANEOUS | Status: DC
  Administered 2011-04-12 – 2011-04-13 (×2): 40 mg via SUBCUTANEOUS
  Filled 2011-04-12 (×4): qty 0.4

## 2011-04-12 MED ORDER — LISINOPRIL 20 MG PO TABS
20.0000 mg | ORAL_TABLET | Freq: Two times a day (BID) | ORAL | Status: DC
  Administered 2011-04-12 – 2011-04-14 (×4): 20 mg via ORAL
  Filled 2011-04-12 (×6): qty 1

## 2011-04-12 MED ORDER — POLYETHYLENE GLYCOL 3350 17 G PO PACK
17.0000 g | PACK | Freq: Every day | ORAL | Status: DC | PRN
  Filled 2011-04-12: qty 1

## 2011-04-12 MED ORDER — OXYCODONE HCL 5 MG PO TABS
5.0000 mg | ORAL_TABLET | ORAL | Status: DC | PRN
  Administered 2011-04-12: 5 mg via ORAL
  Filled 2011-04-12: qty 1

## 2011-04-12 MED ORDER — HYDROCHLOROTHIAZIDE 25 MG PO TABS
25.0000 mg | ORAL_TABLET | Freq: Every day | ORAL | Status: DC
  Administered 2011-04-12 – 2011-04-14 (×3): 25 mg via ORAL
  Filled 2011-04-12 (×3): qty 1

## 2011-04-12 MED ORDER — GABAPENTIN 400 MG PO CAPS
800.0000 mg | ORAL_CAPSULE | Freq: Three times a day (TID) | ORAL | Status: DC
  Administered 2011-04-12 – 2011-04-14 (×5): 800 mg via ORAL
  Filled 2011-04-12 (×9): qty 2

## 2011-04-12 MED ORDER — AMLODIPINE BESYLATE 5 MG PO TABS
5.0000 mg | ORAL_TABLET | Freq: Every day | ORAL | Status: DC
  Administered 2011-04-12 – 2011-04-14 (×3): 5 mg via ORAL
  Filled 2011-04-12 (×4): qty 1

## 2011-04-12 MED ORDER — IPRATROPIUM BROMIDE 0.02 % IN SOLN
0.5000 mg | Freq: Once | RESPIRATORY_TRACT | Status: AC
  Administered 2011-04-12: 0.5 mg via RESPIRATORY_TRACT
  Filled 2011-04-12: qty 2.5

## 2011-04-12 MED ORDER — SODIUM CHLORIDE 0.9 % IV SOLN: INTRAVENOUS | Status: DC

## 2011-04-12 MED ORDER — SODIUM CHLORIDE 0.9 % IJ SOLN
3.0000 mL | Freq: Two times a day (BID) | INTRAMUSCULAR | Status: DC
  Administered 2011-04-14: 3 mL via INTRAVENOUS

## 2011-04-12 MED ORDER — ALBUTEROL SULFATE (5 MG/ML) 0.5% IN NEBU
5.0000 mg | INHALATION_SOLUTION | Freq: Once | RESPIRATORY_TRACT | Status: AC
  Administered 2011-04-12: 5 mg via RESPIRATORY_TRACT
  Filled 2011-04-12: qty 1

## 2011-04-12 MED ORDER — MORPHINE SULFATE 2 MG/ML IJ SOLN: 1.0000 mg | INTRAMUSCULAR | Status: DC | PRN

## 2011-04-12 MED ORDER — ASPIRIN 325 MG PO TABS
325.0000 mg | ORAL_TABLET | ORAL | Status: AC
  Filled 2011-04-12: qty 1

## 2011-04-12 NOTE — H&P (Signed)
Hospital Admission Note Date: 04/12/2011  Patient name: Jonathon [name] Medical record number: [id number] Date of birth: Mar 17, 1950 Age: 61 y.o. Gender: male PCP: Aura Dials, MD, MD  Attending physician: Altha Harm, MD  Chief Complaint: Chest pain  History of Present Illness: This patient is a lovely 61 year old [name] gentleman who presents today with chest pain onset since this morning. The patient describes the pain as being a heaviness on his chest in the substernal region nonradiating. He rates the pain as a 6/10. He did not take anything for the pain and is unable to identify any provocative or palliative features. At the time of my evaluation the patient states that the pain was resolved at the felt well. Of note the patient has had a recent pontine hemorrhagic stroke with residual right-sided weakness which is unchanged since his discharge from rehabilitation. The patient also has some vocal cord paralysis from the past which renders him hoarse when he speaking. The patient however has had no change since his stroke and he has had no difficulty with even drinking. The patient denies any fever, chills, nausea, vomiting, diarrhea, dizziness, near syncope, cough. The patient is a pack per day tobacco user for greater than 45 years.  In the emergency room the patient was noted to have sinus bradycardia with heart rates down to range of 40s. Please note the patient is on clonidine and metoprolol both of which he took today.  Scheduled Meds:   . albuterol  5 mg Nebulization Once  . aspirin EC  325 mg Oral Daily  . aspirin  325 mg Oral STAT  . docusate sodium  100 mg Oral BID  . enoxaparin  40 mg Subcutaneous Q24H  . gabapentin  800 mg Oral TID  . hydrochlorothiazide  25 mg Oral Daily  . ipratropium  0.5 mg Nebulization Once  . lisinopril  20 mg Oral BID  . metoprolol tartrate  50 mg Oral BID  . nortriptyline  25 mg Oral QHS  . senna-docusate  1 tablet Oral BID  .  sodium chloride  3 mL Intravenous Q12H  . sodium chloride  3 mL Intravenous Q12H   Continuous Infusions:   . DISCONTD: sodium chloride     PRN Meds:.sodium chloride, acetaminophen, acetaminophen, morphine, nitroGLYCERIN, oxyCODONE, polyethylene glycol, sodium chloride Allergies: Review of patient's allergies indicates no known allergies. Past Medical History  Diagnosis Date  . CHF (congestive heart failure)   . COPD (chronic obstructive pulmonary disease)   . Hypertension   . Smoker   . Stroke   . Chest pain   . Shortness of breath    Past Surgical History  Procedure Date  . Hernia repair   . Lobectomy    History reviewed. No pertinent family history. History   Social History  . Marital Status: Married    Spouse Name: [name]    Number of Children: [name]  . Years of Education: [name]   Occupational History  . Not on file.   Social History Main Topics  . Smoking status: Current Everyday Smoker -- 1.0 packs/day for 45 years    Types: Cigarettes  . Smokeless tobacco: Never Used  . Alcohol Use: No  . Drug Use: No  . Sexually Active: Yes   Other Topics Concern  . Not on file   Social History Narrative  . No narrative on file   Review of Systems: A comprehensive review of systems was negative. Except as noted in the history of present illness. Physical Exam:  No intake or output data in the 24 hours ending 04/12/11 1848 General: Alert, awake, oriented x3, in no acute distress.  HEENT: [place]/AT PEERL, EOMI Neck: Trachea midline,  no masses, no thyromegal,y no JVD, no carotid bruit OROPHARYNX:  Moist, No exudate/ erythema/lesions.  Heart: Regular rate and rhythm, without murmurs, rubs, gallops, PMI non-displaced, no heaves or thrills on palpation.  Lungs: Clear to auscultation, no wheezing or rhonchi noted. No increased vocal fremitus resonant to percussion  Abdomen: Soft, nontender, nondistended, positive bowel sounds, no masses no hepatosplenomegaly noted..  Neuro: No focal  neurological deficits noted cranial nerves II through XII grossly intact. DTRs 2+ bilaterally upper and lower extremities. Strength 5 out of 5 in bilateral upper and lower extremities. Musculoskeletal: No warm swelling or erythema around joints, no spinal tenderness noted. Psychiatric: Patient alert and oriented x3, good insight and cognition, good recent to remote recall. Lymph node survey: No cervical axillary or inguinal lymphadenopathy noted.  Lab results:  Basename 04/12/11 1008  NA 135  K 3.4*  CL 99  CO2 27  GLUCOSE 97  BUN 13  CREATININE 1.02  CALCIUM 9.0  MG --  PHOS --   No results found for this basename: AST:2,ALT:2,ALKPHOS:2,BILITOT:2,PROT:2,ALBUMIN:2 in the last 72 hours No results found for this basename: LIPASE:2,AMYLASE:2 in the last 72 hours  Basename 04/12/11 1812 04/12/11 1008  WBC 9.6 8.5  NEUTROABS -- --  HGB 15.7 16.1  HCT 44.4 46.2  MCV 87.2 86.8  PLT 202 196    Basename 04/12/11 1008  CKTOTAL --  CKMB --  CKMBINDEX --  TROPONINI <0.30   No components found with this basename: POCBNP:3 No results found for this basename: DDIMER:2 in the last 72 hours No results found for this basename: HGBA1C:2 in the last 72 hours No results found for this basename: CHOL:2,HDL:2,LDLCALC:2,TRIG:2,CHOLHDL:2,LDLDIRECT:2 in the last 72 hours No results found for this basename: TSH,T4TOTAL,FREET3,T3FREE,THYROIDAB in the last 72 hours No results found for this basename: VITAMINB12:2,FOLATE:2,FERRITIN:2,TIBC:2,IRON:2,RETICCTPCT:2 in the last 72 hours Imaging results:  Dg Chest 2 View  04/12/2011  *RADIOLOGY REPORT*  Clinical Data: Shortness of breath, chest pain.  CHEST - 2 VIEW  Comparison: 02/17/2011  Findings: There is hyperinflation of the lungs compatible with COPD.  Heart is normal size.  Densities in the lung bases likely reflects scarring.  No effusions.  No acute bony abnormality. Postoperative changes in the right hilum.  IMPRESSION: COPD/chronic changes.  No  acute findings.  Original Report Authenticated By: Cyndie Chime, [name]   Ct Head Wo Contrast  04/12/2011  *RADIOLOGY REPORT*  Clinical Data: Headache.  CT HEAD WITHOUT CONTRAST  Technique:  Contiguous axial images were obtained from the base of the skull through the vertex without contrast.  Comparison: 02/21/2011  Findings: Previously seen left pontine hemorrhage is no longer visualized. There is atrophy and chronic small vessel disease changes. No acute intracranial abnormality.  Specifically, no hemorrhage, hydrocephalus, mass lesion, acute infarction, or significant intracranial injury.  No acute calvarial abnormality. Visualized paranasal sinuses and mastoids clear.  Orbital soft tissues unremarkable.  IMPRESSION: No acute intracranial abnormality.  Atrophy, chronic microvascular disease.  Original Report Authenticated By: Cyndie Chime, [name]   Other results: ZOX:WRUEA bradycardia.   Patient Active Hospital Problem List: 1. Chest pain: The patient will be admitted on observation basis. His initial troponin is negative and we'll cycle his enzymes to rule out cardiac cause of his pain. It is possible the patient would like him to have to undergo a stress test  for further evaluation.  3. Tobacco use disorder: The patient has been counseled against further tobacco use and his affect of small vessel disease.  4. Hypertension: Blood pressures are controlled at this time. The patient will likely need to have a vasodilator to help maintain his blood pressure normal range is while decreasing the rate limiting medications. I will hold his clonidine for now and continue him on his metoprolol.  5. Mild hypokalemia: Replete orally  Pharrell Ledford A. 04/12/2011, 6:48 PM

## 2011-04-12 NOTE — ED Notes (Signed)
Patient reports he woke with chest pain, sob, and numbness in his tongue.  Patient has hx of cva jan 30th and has right sided deficit.  Patient had hemorrhagic stroke

## 2011-04-12 NOTE — ED Notes (Signed)
Pt given a cup of ice water per Rexene Edison, RN

## 2011-04-12 NOTE — Progress Notes (Signed)
Admitting MD paged and clarified with RN we are not working patient up for stroke. Jonathon [name]

## 2011-04-12 NOTE — ED Provider Notes (Signed)
History     CSN: [id number]  Arrival date & time 04/12/11  4098   First MD Initiated Contact with Patient 04/12/11 1049      Chief Complaint  Patient presents with  . Stroke Symptoms  . Chest Pain  . Shortness of Breath    (Consider location/radiation/quality/duration/timing/severity/associated sxs/prior treatment) HPI Comments: Patient presents with chest pain and shortness of breath that he woke up with this morning.  Patient does smoke one pack of cigarettes per day but denies any inhaler use at home.  He describes the chest pain as a pressure sensation.  This gradually improved to the morning without intervention.  He denies cough or fevers.  He notes that he had a stroke January 30 and has some mild residual right-sided deficits from this.  He describes it dryness in his mouth and on his tongue but no be change in weakness on that side despite what is reported in the initial nursing notes.  Patient denies prior cardiac history.  No cardiac stents.  Patient is a 61 y.o. male presenting with chest pain. The history is provided by the patient and the spouse. No language interpreter was used.  Chest Pain Chest pain occurs constantly. The chest pain is improving. Primary symptoms include shortness of breath. Pertinent negatives for primary symptoms include no fever, no cough, no abdominal pain, no nausea and no vomiting.  Associated symptoms include weakness.     Past Medical History  Diagnosis Date  . CHF (congestive heart failure)   . COPD (chronic obstructive pulmonary disease)   . Hypertension   . Smoker   . Stroke     Past Surgical History  Procedure Date  . Hernia repair   . Lobectomy     History reviewed. No pertinent family history.  History  Substance Use Topics  . Smoking status: Current Everyday Smoker    Types: Cigarettes  . Smokeless tobacco: Not on file  . Alcohol Use: Yes      Review of Systems  Constitutional: Negative for fever and chills.    HENT: Negative.   Eyes: Negative.  Negative for discharge and redness.  Respiratory: Positive for shortness of breath. Negative for cough.   Cardiovascular: Positive for chest pain.  Gastrointestinal: Negative.  Negative for nausea, vomiting and abdominal pain.  Genitourinary: Negative.  Negative for hematuria.  Musculoskeletal: Negative.  Negative for back pain.  Skin: Negative.  Negative for color change and rash.  Neurological: Positive for weakness. Negative for syncope and headaches.  Hematological: Negative.  Negative for adenopathy.  Psychiatric/Behavioral: Negative.  Negative for confusion.  All other systems reviewed and are negative.    Allergies  Review of patient's allergies indicates no known allergies.  Home Medications   Current Outpatient Rx  Name Route Sig Dispense Refill  . ASPIRIN EC 81 MG PO TBEC Oral Take 81 mg by mouth daily.    Marland Kitchen CLONIDINE HCL 0.3 MG PO TABS Oral Take 0.3 mg by mouth 2 (two) times daily.    Marland Kitchen GABAPENTIN 400 MG PO CAPS Oral Take 800 mg by mouth 3 (three) times daily.    Marland Kitchen HYDROCHLOROTHIAZIDE 25 MG PO TABS Oral Take 25 mg by mouth daily.    Marland Kitchen LISINOPRIL 20 MG PO TABS Oral Take 20 mg by mouth 2 (two) times daily.    Marland Kitchen METOPROLOL SUCCINATE ER 100 MG PO TB24 Oral Take 100 mg by mouth daily. Take with or immediately following a meal.    . NITROGLYCERIN 0.4 MG SL  SUBL Sublingual Place 0.4 mg under the tongue every 5 (five) minutes as needed. For chest pain    . NORTRIPTYLINE HCL 25 MG PO CAPS Oral Take 25 mg by mouth at bedtime.    Bernadette Hoit SODIUM 8.6-50 MG PO TABS Oral Take 1 tablet by mouth 2 (two) times daily.      BP 161/97  Pulse 49  Temp(Src) 97.3 F (36.3 C) (Oral)  Resp 20  Ht 5\' 10"  (1.778 m)  Wt 200 lb (90.719 kg)  BMI 28.70 kg/m2  SpO2 96%  Physical Exam  Nursing note and vitals reviewed. Constitutional: He is oriented to person, place, and time. He appears well-developed and well-nourished.  Non-toxic appearance.  He does not have a sickly appearance.  HENT:  Head: Normocephalic and atraumatic.  Eyes: Conjunctivae, EOM and lids are normal. Pupils are equal, round, and reactive to light.  Neck: Trachea normal, normal range of motion and full passive range of motion without pain. Neck supple.  Cardiovascular: Normal rate, regular rhythm and normal heart sounds.   Pulmonary/Chest: Effort normal. No respiratory distress. He has wheezes.  Abdominal: Soft. Normal appearance. He exhibits no distension. There is no tenderness. There is no rebound and no CVA tenderness.  Musculoskeletal: Normal range of motion. He exhibits no edema.  Neurological: He is alert and oriented to person, place, and time. He has normal strength.       Mild right arm and right leg weakness compared with the left.  There is subtle right pronator drift on exam.  Patient still has good strength against resistance.  Sensation to light touch is intact.  Face is symmetric and tongue is midline.  Skin: Skin is warm, dry and intact. No rash noted.  Psychiatric: He has a normal mood and affect. His behavior is normal. Judgment and thought content normal.    ED Course  Procedures (including critical care time)   Labs Reviewed  CBC  BASIC METABOLIC PANEL  PRO B NATRIURETIC PEPTIDE  TROPONIN I   Dg Chest 2 View  04/12/2011  *RADIOLOGY REPORT*  Clinical Data: Shortness of breath, chest pain.  CHEST - 2 VIEW  Comparison: 02/17/2011  Findings: There is hyperinflation of the lungs compatible with COPD.  Heart is normal size.  Densities in the lung bases likely reflects scarring.  No effusions.  No acute bony abnormality. Postoperative changes in the right hilum.  IMPRESSION: COPD/chronic changes.  No acute findings.  Original Report Authenticated By: Cyndie Chime, [name]     No diagnosis found.   Date: 04/12/2011  Rate: 49  Rhythm: normal sinus rhythm  QRS Axis: normal  Intervals: normal  ST/T Wave abnormalities: T wave inversions in  inferior and lateral leads  Conduction Disutrbances:none  Narrative Interpretation:   Old EKG Reviewed: Changed from 02-19-11 with T wave inversions in lateral leads only    MDM  Patient will require admission for further evaluation of his chest pressure and shortness of breath.  He did have some wheezes on exam and so is receiving a nebulizer treatment here.  He does have cardiac risk factors and I believe warrants admission for further evaluation of this pain at his last stress test was in 2007.  Patient is getting a head CT as he failed a swallow screen to some feelings of difficulty swallowing.  Patient to remove a CDU pending his studies and admission to an unassigned medicine team.        Nat Christen, MD 04/12/11 [phone]

## 2011-04-12 NOTE — ED Notes (Signed)
Pt wife reports woke with symptoms. LSN last night

## 2011-04-12 NOTE — ED Notes (Signed)
3712-01 Ready 

## 2011-04-12 NOTE — ED Notes (Signed)
Secretary to order ST swallow eval

## 2011-04-13 DIAGNOSIS — R079 Chest pain, unspecified: Secondary | ICD-10-CM

## 2011-04-13 LAB — BASIC METABOLIC PANEL
BUN: 14 mg/dL (ref 6–23)
BUN: 14 mg/dL (ref 6–23)
CO2: 29 mEq/L (ref 19–32)
CO2: 27 mEq/L (ref 19–32)
Calcium: 8.9 mg/dL (ref 8.4–10.5)
Calcium: 9.4 mg/dL (ref 8.4–10.5)
Chloride: 100 mEq/L (ref 96–112)
Chloride: 100 mEq/L (ref 96–112)
Creatinine, Ser: 1.03 mg/dL (ref 0.50–1.35)
Creatinine, Ser: 0.98 mg/dL (ref 0.50–1.35)
GFR calc Af Amer: 89 mL/min — ABNORMAL LOW (ref >90)
GFR calc Af Amer: >90 mL/min (ref >90)
GFR calc non Af Amer: 76 mL/min — ABNORMAL LOW (ref >90)
GFR calc non Af Amer: 87 mL/min — ABNORMAL LOW (ref >90)
Glucose, Bld: 104 mg/dL — ABNORMAL HIGH (ref 70–99)
Glucose, Bld: 120 mg/dL — ABNORMAL HIGH (ref 70–99)
Potassium: 3.3 mEq/L — ABNORMAL LOW (ref 3.5–5.1)
Potassium: 3.5 mEq/L (ref 3.5–5.1)
Sodium: 137 mEq/L (ref 135–145)
Sodium: 137 mEq/L (ref 135–145)

## 2011-04-13 LAB — CARDIAC PANEL(CRET KIN+CKTOT+MB+TROPI)
CK, MB: 2.2 ng/mL (ref 0.3–4.0)
CK, MB: 2.1 ng/mL (ref 0.3–4.0)
Relative Index: INVALID (ref 0.0–2.5)
Relative Index: INVALID (ref 0.0–2.5)
Total CK: 83 U/L (ref 7–232)
Total CK: 77 U/L (ref 7–232)
Troponin I: <0.3 ng/mL (ref <0.30)
Troponin I: <0.3 ng/mL (ref <0.30)

## 2011-04-13 NOTE — Consult Note (Signed)
CARDIOLOGY CONSULT NOTE    Patient ID: Jonathon [name] MRN: [id number] DOB/AGE: 1950-04-14 61 y.o.  Admit date: 04/12/2011 Referring Physician: Robb Matar Primary Physician: Aura Dials, MD, MD Primary Cardiologist:  New Reason for Consultation: Chest Pain  Active Problems:  HTN (hypertension)  Chest pain at rest   HPI:  61 year old [name] gentleman who presents 3/26 with chest pain onset since this morning. The patient describes the pain as being a heaviness on his chest in the substernal region nonradiating. He rates the pain as a 6/10. He did not take anything for the pain and is unable to identify any provocative or palliative features. 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 The patient also has some vocal cord paralysis from the past which renders him hoarse when he speaking. The patient however has had no change since his stroke and he has had no difficulty with even drinking.  The patient denies any fever, chills, nausea, vomiting, diarrhea, dizziness, near syncope, cough. The patient is a pack per day tobacco user for greater than 45 years. He has had a previous right thoracotomy for blebs and has some chronic exertional dyspnea.  Not likely to do good on ETT due to dyspnea and right sided weakness  Enzymes negative and had normal EF by echo 1/13 when he had his CVA   @ROS @ All other systems reviewed and negative except as noted above  Past Medical History  Diagnosis Date  . CHF (congestive heart failure)   . COPD (chronic obstructive pulmonary disease)   . Hypertension   . Smoker   . Stroke   . Chest pain   . Shortness of breath     History reviewed. No pertinent family history.  History   Social History  . Marital Status: Married    Spouse Name: [name]    Number of Children: [name]  . Years of Education: [name]    Occupational History  . Not on file.   Social History Main Topics  . Smoking status: Current Everyday Smoker -- 1.0 packs/day for 45 years    Types: Cigarettes  . Smokeless tobacco: Never Used  . Alcohol Use: No  . Drug Use: No  . Sexually Active: Yes   Other Topics Concern  . Not on file   Social History Narrative  . No narrative on file    Past Surgical History  Procedure Date  . Hernia repair   . Lobectomy         . amLODipine  5 mg Oral Daily  . aspirin EC  325 mg Oral Daily  . aspirin  325 mg Oral STAT  . docusate sodium  100 mg Oral BID  . enoxaparin  40 mg Subcutaneous Q24H  . gabapentin  800 mg Oral TID  . hydrochlorothiazide  25 mg Oral Daily  . lisinopril  20 mg Oral BID  . metoprolol tartrate  50 mg Oral BID  . nortriptyline  25 mg Oral QHS  . potassium chloride  40 mEq Oral BID  . senna-docusate  1 tablet Oral BID  . sodium chloride  3 mL Intravenous Q12H  . sodium chloride  3 mL Intravenous Q12H      . DISCONTD: sodium chloride      Physical Exam:   Affect appropriate Chronically ill [name] male with hoarse voice HEENT: normal Neck supple with no adenopathy JVP normal  no bruits no thyromegaly Lungs poor breath sounds no wheezing and good diaphragmatic motion Right Thoracotomy Heart:  S1/S2 no murmur, no rub, gallop or click PMI normal Abdomen: benighn, BS positve, no tenderness, no AAA no bruit.  No HSM or HJR Distal pulses intact with no bruits No edema Neuro some residual RUE/RLE weakness Skin warm and dry No muscular atrophy  Labs:   Lab Results  Component Value Date   WBC 9.6 04/12/2011   HGB 15.7 04/12/2011   HCT 44.4 04/12/2011   MCV 87.2 04/12/2011   PLT 202 04/12/2011    Lab 04/13/11 0159  NA 137  K 3.3*  CL 100  CO2 29  BUN 14  CREATININE 1.03  CALCIUM 8.9  PROT --  BILITOT --  ALKPHOS --  ALT --  AST --  GLUCOSE 104*   Lab Results  Component Value Date   CKTOTAL 77 04/13/2011   CKMB 2.1 04/13/2011    TROPONINI <0.30 04/13/2011    Lab Results  Component Value Date   CHOL 133 02/18/2011   CHOL  Value: 123        ATP III CLASSIFICATION:  <200     mg/dL   Desirable  161-096  mg/dL   Borderline High  >=045    mg/dL   High        4/0/9811   Lab Results  Component Value Date   HDL 50 02/18/2011   HDL 45 09/25/2009   Lab Results  Component Value Date   LDLCALC 72 02/18/2011   LDLCALC  Value: 60        Total Cholesterol/HDL:CHD Risk Coronary Heart Disease Risk Table                     Men   Women  1/2 Average Risk   3.4   3.3  Average Risk       5.0   4.4  2 X Average Risk   9.6   7.1  3 X Average Risk  23.4   11.0        Use the calculated Patient Ratio above and the CHD Risk Table to determine the patient's CHD Risk.        ATP III CLASSIFICATION (LDL):  <100     mg/dL   Optimal  914-782  mg/dL   Near or Above                    Optimal  130-159  mg/dL   Borderline  956-213  mg/dL   High  >086     mg/dL   Very High 05/24/8467   Lab Results  Component Value Date   TRIG 56 02/18/2011   TRIG 91 09/25/2009   Lab Results  Component Value Date   CHOLHDL 2.7 02/18/2011   CHOLHDL 2.7 09/25/2009   No results found for this basename: LDLDIRECT      Radiology: Dg Chest 2 View  04/12/2011  *RADIOLOGY REPORT*  Clinical Data: Shortness of breath, chest pain.  CHEST - 2 VIEW  Comparison: 02/17/2011  Findings: There is hyperinflation of the lungs compatible with COPD.  Heart is normal size.  Densities in the lung bases likely reflects scarring.  No effusions.  No acute bony abnormality. Postoperative changes in the right hilum.  IMPRESSION: COPD/chronic changes.  No acute findings.  Original Report Authenticated By: Cyndie Chime, [name]   Ct Head Wo Contrast  04/12/2011  *RADIOLOGY REPORT*  Clinical Data: Headache.  CT HEAD WITHOUT CONTRAST  Technique:  Contiguous axial images were obtained from the base of the skull through the vertex without contrast.  Comparison: 02/21/2011  Findings: Previously seen left pontine  hemorrhage is no longer visualized. There is atrophy and chronic small vessel disease changes. No acute intracranial abnormality.  Specifically, no hemorrhage, hydrocephalus, mass lesion, acute infarction, or significant intracranial injury.  No acute calvarial abnormality. Visualized paranasal sinuses and mastoids clear.  Orbital soft tissues unremarkable.  IMPRESSION: No acute intracranial abnormality.  Atrophy, chronic microvascular disease.  Original Report Authenticated By: Cyndie Chime, [name]    EKG:   NSR inferolateral T wave changes new since January.  No acute ST elevation.     ASSESSMENT AND PLAN:  Chest Pain.  Limited episode with negative enzymes.  Hesitant for invasive evaluation given recent CVA and likely inability to anticoagulate.  Clinical presentation low risk but ECG changes a little worrisome in smoker Agree with risk stratification with myovue.  Will use Lexiscan given lung disease and right sided weakness HTN:  Continue diuretic and norvasc.  Well controlled Pulm:  COPD with ongoing smoking and previous thoracotomy.  F/U PVT.  Smoking cessation consult although he is not motivated to quit  Signed: [name] 04/13/2011, 3:14 PM

## 2011-04-13 NOTE — Progress Notes (Signed)
Utilization review completed. Jonathon [name] 04/13/2011 

## 2011-04-13 NOTE — Progress Notes (Signed)
Subjective: No complains. Chest pain free Objective: Filed Vitals:   04/12/11 1203 04/12/11 1630 04/12/11 2011 04/13/11 0500  BP: 149/84 169/97 155/86 172/97  Pulse: 42 56 51 47  Temp: 97.5 F (36.4 C) 97.8 F (36.6 C) 98 F (36.7 C) 98.2 F (36.8 C)  TempSrc: Oral  Oral   Resp: 18 18 18 18   Height:      Weight:    95.119 kg (209 lb 11.2 oz)  SpO2: 95% 94% 95% 91%   Weight change:   Intake/Output Summary (Last 24 hours) at 04/13/11 1142 Last data filed at 04/13/11 0900  Gross per 24 hour  Intake    240 ml  Output      0 ml  Net    240 ml    General: Alert, awake, oriented x3, in no acute distress.  HEENT: No bruits, no goiter.  Heart: Regular rate and rhythm, without murmurs, rubs, gallops.  Lungs: good air movement and CTA B/L Abdomen: Soft, nontender, nondistended, positive bowel sounds.  Neuro: Grossly intact, nonfocal.   Lab Results:  Basename 04/13/11 0159 04/12/11 1812 04/12/11 1008  NA 137 -- 135  K 3.3* -- 3.4*  CL 100 -- 99  CO2 29 -- 27  GLUCOSE 104* -- 97  BUN 14 -- 13  CREATININE 1.03 1.13 1.02  CALCIUM 8.9 -- 9.0  MG -- -- --  PHOS -- -- --   No results found for this basename: AST:2,ALT:2,ALKPHOS:2,BILITOT:2,PROT:2,ALBUMIN:2 in the last 72 hours No results found for this basename: LIPASE:2,AMYLASE:2 in the last 72 hours  Basename 04/12/11 1812 04/12/11 1008  WBC 9.6 8.5  NEUTROABS -- --  HGB 15.7 16.1  HCT 44.4 46.2  MCV 87.2 86.8  PLT 202 196    Basename 04/13/11 0159 04/12/11 1753 04/12/11 1008  CKTOTAL 83 84 --  CKMB 2.2 2.4 --  CKMBINDEX -- -- --  TROPONINI <0.30 <0.30 <0.30   No components found with this basename: POCBNP:3 No results found for this basename: DDIMER:2 in the last 72 hours No results found for this basename: HGBA1C:2 in the last 72 hours No results found for this basename: CHOL:2,HDL:2,LDLCALC:2,TRIG:2,CHOLHDL:2,LDLDIRECT:2 in the last 72 hours No results found for this basename:  TSH,T4TOTAL,FREET3,T3FREE,THYROIDAB in the last 72 hours No results found for this basename: VITAMINB12:2,FOLATE:2,FERRITIN:2,TIBC:2,IRON:2,RETICCTPCT:2 in the last 72 hours  Micro Results: No results found for this or any previous visit (from the past 240 hour(s)).  Studies/Results: Dg Chest 2 View  04/12/2011  *RADIOLOGY REPORT*  Clinical Data: Shortness of breath, chest pain.  CHEST - 2 VIEW  Comparison: 02/17/2011  Findings: There is hyperinflation of the lungs compatible with COPD.  Heart is normal size.  Densities in the lung bases likely reflects scarring.  No effusions.  No acute bony abnormality. Postoperative changes in the right hilum.  IMPRESSION: COPD/chronic changes.  No acute findings.  Original Report Authenticated By: Cyndie Chime, [name]   Ct Head Wo Contrast  04/12/2011  *RADIOLOGY REPORT*  Clinical Data: Headache.  CT HEAD WITHOUT CONTRAST  Technique:  Contiguous axial images were obtained from the base of the skull through the vertex without contrast.  Comparison: 02/21/2011  Findings: Previously seen left pontine hemorrhage is no longer visualized. There is atrophy and chronic small vessel disease changes. No acute intracranial abnormality.  Specifically, no hemorrhage, hydrocephalus, mass lesion, acute infarction, or significant intracranial injury.  No acute calvarial abnormality. Visualized paranasal sinuses and mastoids clear.  Orbital soft tissues unremarkable.  IMPRESSION: No acute intracranial abnormality.  Atrophy, chronic microvascular disease.  Original Report Authenticated By: Cyndie Chime, [name]    Medications: I have reviewed the patient's current medications.  Assessment and plan: Active Problems:  Chest pain at rest: -CM negative, ECK shows new T wave inversion. Will call cards for possible stress test as an inpatient. -patient has multiple medical risk, history stroke, tobacco abuse, age and male.    LOS: 1 day   Marinda Elk [name] Pager:  [phone] [facility] 04/13/2011, 11:42 AM

## 2011-04-13 NOTE — Evaluation (Signed)
Clinical/Bedside Swallow Evaluation Patient Details  Name: Jonathon [name] MRN: [id number] DOB: December 31, 1950 Today's Date: 04/13/2011  Past Medical History:  Past Medical History  Diagnosis Date  . CHF (congestive heart failure)   . COPD (chronic obstructive pulmonary disease)   . Hypertension   . Smoker   . Stroke   . Chest pain   . Shortness of breath    Past Surgical History:  Past Surgical History  Procedure Date  . Hernia repair   . Lobectomy    HPI:  61 y.o. male admitted from home with chest pain.  Recent pontine CVA with CIR admission in early February, MBSS on 02/18/11 revealing a delayed swallow to the pyriform sinuses without any aspiration and tolerated a Regular, Thin liquid diet throughout that hospital stay and since.   Assessment/Recommendations/Treatment Plan   SLP Assessment Clinical Impression Statement: Demonstrates a functional oral-pharyngeal swallow without any clinical indicators of a pharyngeal dysphagia.  Hoarse vocal quality is his baseline since his 20's and he has never sought an assessment or diagnosis. Risk for Aspiration: None Other Related Risk Factors: Previous CVA  Swallow Evaluation Recommendations Diet Recommendations: Regular;Thin liquid Liquid Administration via: Straw;Cup Medication Administration: Whole meds with liquid Supervision: Patient able to self feed Follow up Recommendations: None  Treatment Plan Treatment Plan Recommendations: No treatment recommended at this time  Myra Rude, M.S.,CCC-SLP Pager [phone] 04/13/2011,1:04 PM

## 2011-04-13 NOTE — Consult Note (Signed)
Pt smokes 1 ppd and is in action stage wanting to quit. Recommended 21 mg patch x 6 weeks, 14 mg patch x 2 weeks and 7 mg patch x 2 weeks. Discussed patch use instructions and how to taper. Referred to 1-800 quit now for f/u and support. Discussed oral fixation substitutes, second hand smoke and in home smoking policy. Reviewed and gave pt Written education/contact information.

## 2011-04-14 ENCOUNTER — Observation Stay (HOSPITAL_COMMUNITY): Payer: Medicaid Other

## 2011-04-14 ENCOUNTER — Encounter: Payer: Medicaid Other | Admitting: Occupational Therapy

## 2011-04-14 ENCOUNTER — Ambulatory Visit: Payer: Medicaid Other | Admitting: Physical Therapy

## 2011-04-14 DIAGNOSIS — R079 Chest pain, unspecified: Secondary | ICD-10-CM

## 2011-04-14 MED ORDER — TECHNETIUM TC 99M TETROFOSMIN IV KIT
30.0000 | PACK | Freq: Once | INTRAVENOUS | Status: AC | PRN
  Administered 2011-04-14: 30 via INTRAVENOUS

## 2011-04-14 MED ORDER — LISINOPRIL 40 MG PO TABS
40.0000 mg | ORAL_TABLET | Freq: Two times a day (BID) | ORAL | Status: DC
  Filled 2011-04-14: qty 1

## 2011-04-14 MED ORDER — NITROGLYCERIN 0.4 MG SL SUBL: 0.4000 mg | SUBLINGUAL_TABLET | SUBLINGUAL | Status: DC | PRN

## 2011-04-14 MED ORDER — REGADENOSON 0.4 MG/5ML IV SOLN
0.4000 mg | Freq: Once | INTRAVENOUS | Status: AC
  Administered 2011-04-14: 0.4 mg via INTRAVENOUS
  Filled 2011-04-14: qty 5

## 2011-04-14 MED ORDER — AMLODIPINE BESYLATE 10 MG PO TABS: 10.0000 mg | ORAL_TABLET | Freq: Every day | ORAL | Status: DC

## 2011-04-14 MED ORDER — TECHNETIUM TC 99M TETROFOSMIN IV KIT
10.0000 | PACK | Freq: Once | INTRAVENOUS | Status: AC | PRN
  Administered 2011-04-14: 10 via INTRAVENOUS

## 2011-04-14 NOTE — Progress Notes (Signed)
Patient seen briefly in nuclear stress room.   Pre Test: No CP/SOB. HR upper 40's, BP elevated at baseline. EKG is abnormal at baseline & shows TWI I, and TWI/ST depression II, III, avF and V6 similar to admit EKG as previously described.   During Test: c/o immediate SOB and abdominal cramping. First subsequent EKG difficult to interpret given patient movement but next EKG did not show any significant change from baseline. VSS - HR increased to 70s and BP remained elevated.  Post Test: Started to feel better. EKG remains at baseline. BP still elevated, HR in 70s.  Await interpretation of images.  Faizah Kandler PA-C 04/14/2011

## 2011-04-14 NOTE — Progress Notes (Signed)
Subjective: No complains. Chest pain free Objective: Filed Vitals:   04/14/11 0927 04/14/11 0929 04/14/11 0931 04/14/11 1055  BP: 162/94 176/110 172/102 184/106  Pulse: 79 72 70 60  Temp:      TempSrc:      Resp:      Height:      Weight:      SpO2:       Weight change: 3.357 kg (7 lb 6.4 oz)  Intake/Output Summary (Last 24 hours) at 04/14/11 1056 Last data filed at 04/14/11 1610  Gross per 24 hour  Intake    720 ml  Output      0 ml  Net    720 ml    General: Alert, awake, oriented x3, in no acute distress.  HEENT: No bruits, no goiter.  Heart: Regular rate and rhythm, without murmurs, rubs, gallops.  Lungs: good air movement and CTA B/L Abdomen: Soft, nontender, nondistended, positive bowel sounds.  Neuro: Grossly intact, nonfocal.   Lab Results:  Basename 04/13/11 1401 04/13/11 0159 04/12/11 1812 04/12/11 1008  NA 137 137 -- 135  K 3.5 3.3* -- 3.4*  CL 100 100 -- 99  CO2 27 29 -- 27  GLUCOSE 120* 104* -- 97  BUN 14 14 -- 13  CREATININE 0.98 1.03 1.13 1.02  CALCIUM 9.4 8.9 -- 9.0  MG -- -- -- --  PHOS -- -- -- --   No results found for this basename: AST:2,ALT:2,ALKPHOS:2,BILITOT:2,PROT:2,ALBUMIN:2 in the last 72 hours No results found for this basename: LIPASE:2,AMYLASE:2 in the last 72 hours  Basename 04/12/11 1812 04/12/11 1008  WBC 9.6 8.5  NEUTROABS -- --  HGB 15.7 16.1  HCT 44.4 46.2  MCV 87.2 86.8  PLT 202 196    Basename 04/13/11 1045 04/13/11 0159 04/12/11 1753  CKTOTAL 77 83 84  CKMB 2.1 2.2 2.4  CKMBINDEX -- -- --  TROPONINI <0.30 <0.30 <0.30   No components found with this basename: POCBNP:3 No results found for this basename: DDIMER:2 in the last 72 hours No results found for this basename: HGBA1C:2 in the last 72 hours No results found for this basename: CHOL:2,HDL:2,LDLCALC:2,TRIG:2,CHOLHDL:2,LDLDIRECT:2 in the last 72 hours No results found for this basename: TSH,T4TOTAL,FREET3,T3FREE,THYROIDAB in the last 72 hours No results  found for this basename: VITAMINB12:2,FOLATE:2,FERRITIN:2,TIBC:2,IRON:2,RETICCTPCT:2 in the last 72 hours  Micro Results: No results found for this or any previous visit (from the past 240 hour(s)).  Studies/Results: Ct Head Wo Contrast  04/12/2011  *RADIOLOGY REPORT*  Clinical Data: Headache.  CT HEAD WITHOUT CONTRAST  Technique:  Contiguous axial images were obtained from the base of the skull through the vertex without contrast.  Comparison: 02/21/2011  Findings: Previously seen left pontine hemorrhage is no longer visualized. There is atrophy and chronic small vessel disease changes. No acute intracranial abnormality.  Specifically, no hemorrhage, hydrocephalus, mass lesion, acute infarction, or significant intracranial injury.  No acute calvarial abnormality. Visualized paranasal sinuses and mastoids clear.  Orbital soft tissues unremarkable.  IMPRESSION: No acute intracranial abnormality.  Atrophy, chronic microvascular disease.  Original Report Authenticated By: Cyndie Chime, [name]    Medications: I have reviewed the patient's current medications.  Assessment and plan: Active Problems:  Chest pain at rest: -CM negative, ECK shows new T wave inversion. awaiting stress test results. -patient has multiple medical risk, history stroke, tobacco abuse, age and male. -tabacco counseling.    LOS: 2 days   Marinda Elk [name] Pager: [phone] [facility] 04/14/2011, 10:56 AM

## 2011-04-14 NOTE — Discharge Summary (Signed)
Admit date: 04/12/2011 Discharge date: 04/14/2011  Primary Care Physician:  Aura Dials, MD, MD   Discharge Diagnoses:   Active Hospital Problems  Diagnoses Date Noted   . HTN (hypertension) 02/23/2011     Resolved Hospital Problems  Diagnoses Date Noted Date Resolved  . Chest pain at rest 04/13/2011 04/14/2011     DISCHARGE MEDICATION: Medication List  As of 04/14/2011  2:58 PM   TAKE these medications         amLODipine 10 MG tablet   Commonly known as: NORVASC   Take 1 tablet (10 mg total) by mouth daily.      aspirin EC 81 MG tablet   Take 81 mg by mouth daily.      cloNIDine 0.3 MG tablet   Commonly known as: CATAPRES   Take 0.3 mg by mouth 2 (two) times daily.      gabapentin 400 MG capsule   Commonly known as: NEURONTIN   Take 800 mg by mouth 3 (three) times daily.      hydrochlorothiazide 25 MG tablet   Commonly known as: HYDRODIURIL   Take 25 mg by mouth daily.      lisinopril 20 MG tablet   Commonly known as: PRINIVIL,ZESTRIL   Take 20 mg by mouth 2 (two) times daily.      metoprolol succinate 100 MG 24 hr tablet   Commonly known as: TOPROL-XL   Take 100 mg by mouth daily. Take with or immediately following a meal.      nitroGLYCERIN 0.4 MG SL tablet   Commonly known as: NITROSTAT   Place 1 tablet (0.4 mg total) under the tongue every 5 (five) minutes as needed. For chest pain      nortriptyline 25 MG capsule   Commonly known as: PAMELOR   Take 25 mg by mouth at bedtime.      senna-docusate 8.6-50 MG per tablet   Commonly known as: Senokot-S   Take 1 tablet by mouth 2 (two) times daily.              Consults: Treatment Team:  Rounding [facility]cardiology, MD   SIGNIFICANT DIAGNOSTIC STUDIES:  Dg Chest 2 View  04/12/2011  *RADIOLOGY REPORT*  Clinical Data: Shortness of breath, chest pain.  CHEST - 2 VIEW  Comparison: 02/17/2011  Findings: There is hyperinflation of the lungs compatible with COPD.  Heart is normal size.  Densities in the lung  bases likely reflects scarring.  No effusions.  No acute bony abnormality. Postoperative changes in the right hilum.  IMPRESSION: COPD/chronic changes.  No acute findings.  Original Report Authenticated By: Cyndie Chime, [name]   Ct Head Wo Contrast  04/12/2011  *RADIOLOGY REPORT*  Clinical Data: Headache.  CT HEAD WITHOUT CONTRAST  Technique:  Contiguous axial images were obtained from the base of the skull through the vertex without contrast.  Comparison: 02/21/2011  Findings: Previously seen left pontine hemorrhage is no longer visualized. There is atrophy and chronic small vessel disease changes. No acute intracranial abnormality.  Specifically, no hemorrhage, hydrocephalus, mass lesion, acute infarction, or significant intracranial injury.  No acute calvarial abnormality. Visualized paranasal sinuses and mastoids clear.  Orbital soft tissues unremarkable.  IMPRESSION: No acute intracranial abnormality.  Atrophy, chronic microvascular disease.  Original Report Authenticated By: Cyndie Chime, [name]   Nm Myocar Multi W/spect W/wall Motion / Ef  04/14/2011  Lexiscan Myovue:  Indication: Chest Pain  The patient was given .4 mg of Lexiscan as a bolus for stress images.  Resting ECG showed SR rate 48 LVH with strain.  No changes with infusion. BP stable at 159/91. No SSCP.  Images were reconstructed in the vetical , horizontal and short axis views.  Resting and stress images showed a small inferolateral wall infarct at mid and basal levels with no ischemia.  SDS 7 abnormal in septum.  EF 55% with no definitive RWMA  Impression:  Small inferolateral wall infarct mid and basal level no ischemia.  EF 55%  [name] MD [facility]  Original Report Authenticated By: Marita Snellen     CARDIAC CATH & OTHER PROCEDURES: NM Mycardial infarction: Images were reconstructed in the vetical , horizontal and short axis views. Resting and stress images showed a small inferolateral wall infarct at mid and basal levels with no  ischemia. SDS 7 abnormal in septum. EF 55% with no definitive RWMA.   No results found for this or any previous visit (from the past 240 hour(s)).  BRIEF ADMITTING H & P:  61 year old [name] gentleman who presents today with chest pain onset since this morning. The patient describes the pain as being a heaviness on his chest in the substernal region nonradiating. He rates the pain as a 6/10. He did not take anything for the pain and is unable to identify any provocative or palliative features. At the time of my evaluation the patient states that the pain was resolved at the felt well. Of note the patient has had a recent pontine hemorrhagic stroke with residual right-sided weakness which is unchanged since his discharge from rehabilitation. The patient also has some vocal cord paralysis from the past which renders him hoarse when he speaking. The patient however has had no change since his stroke and he has had no difficulty with even drinking.  The patient denies any fever, chills, nausea, vomiting, diarrhea, dizziness, near syncope, cough. The patient is a pack per day tobacco user for greater than 45 years.  Active Hospital Problems  Diagnoses Date Noted   . HTN (hypertension): His lisinopril was decrease on admission. Then it was increase at a higher dose as well as his norvasc. He will follow up with his PCP as an outpatient. Will titrate his Bp as blood pressure tolerates. 02/23/2011     Resolved Hospital Problems  Diagnoses Date Noted Date Resolved  . Chest pain at rest: Cardiac enzymes were cycled which were negative,  due to his high risk for any cardiac event and his appearance of the EKG (showed new T inversiona and axis change) cardiology was consulted they've agreed with a Myoview that showed results as above. The patient had a hemorrhagic stroke about 2 months ago. The patient was offered a choice between having in-hospital cath versus doing it as an outpatient. He chose to do it as  an outpatient he will go out of aspirin, and nitroglycerin when necessary for any further chest pains. He has been advised that if he develops chest pain that is not improved with the nitroglycerin, he should come to the emergency room.  04/13/2011 04/14/2011     Disposition and Follow-up:  Discharge Orders    Future Appointments: Provider: Department: Dept Phone: Center:   04/19/2011 10:30 AM Roselind Messier, PT [facility] [phone] [facility]   04/19/2011 11:15 AM Colonel Bald, OT [facility] [phone] [facility]   04/21/2011 9:45 AM Roselind Messier, PT [facility] [phone] [facility]   04/21/2011 10:30 AM Marlis Edelson, PT [facility] [phone] [facility]   04/26/2011 10:30 AM Roselind Messier,  PT [facility] [phone] [facility]   04/26/2011 11:15 AM Colonel Bald, OT [facility] [phone] [facility]   04/29/2011 10:30 AM Colonel Bald, OT [facility] [phone] [facility]   04/29/2011 11:15 AM Roselind Messier, PT [facility] [phone] [facility]   05/02/2011 9:30 AM Dyann Kief, PA [facility] [phone] [facility]     Future Orders Please Complete By Expires   Diet - low sodium heart healthy      Increase activity slowly        Follow-up Information    Follow up with [facility] in 1 week. (You will see Jacolyn Reedy, PA-C at Dr. Fabio Bering office on 05/02/11 at 9:30am. If you have any concerns in the meantime, don't hesitate to contact Dr. Fabio Bering office)    Contact information:   [address] [place] [address]           DISCHARGE EXAM:  See progress note  Blood pressure 177/98, pulse 56, temperature 98.4 F (36.9 C), temperature source Oral, resp. rate 18, height 5\' 10"  (1.778 m), weight 94.076 kg (207 lb 6.4 oz), SpO2 94.00%.   Basename 04/13/11 1401 04/13/11 0159  NA 137 137  K 3.5 3.3*  CL 100 100  CO2 27 29  GLUCOSE 120* 104*  BUN 14 14  CREATININE 0.98 1.03  CALCIUM 9.4 8.9  MG -- --  PHOS -- --   No results  found for this basename: AST:2,ALT:2,ALKPHOS:2,BILITOT:2,PROT:2,ALBUMIN:2 in the last 72 hours No results found for this basename: LIPASE:2,AMYLASE:2 in the last 72 hours  Basename 04/12/11 1812 04/12/11 1008  WBC 9.6 8.5  NEUTROABS -- --  HGB 15.7 16.1  HCT 44.4 46.2  MCV 87.2 86.8  PLT 202 196    Signed: Marinda Elk [name] 04/14/2011, 2:58 PM

## 2011-04-14 NOTE — Progress Notes (Signed)
Pt discharged to home per MD order.  Pt and family received all discharge instructions and follow-up appointment information, including prescriptions.  Pt also received heart failure education folder and tobacco cessation information.  Efraim Kaufmann

## 2011-04-14 NOTE — Progress Notes (Signed)
Nuclear tesults reviewed with Dr. Eden Emms. Dr. Eden Emms is concerned for small inferolateral wall infarct in the setting of multiple cardiac risk factors and abnormal EKG. However, the patient has not had any further chest pain and enzymes have remained negative, and nuc was negative for ischemia Dr. Eden Emms recommended cardiac cath tomorrow but due to patient preference to go home, he also felt the option of close follow-up as an outpatient was feasible. I discussed these options with the patient including results of tests & risks of going home with the patient who verbalized understanding. He ultimately elected to go home and follow-up as an outpatient. I educated him on warning signs and when to contact our office.  He will follow up with Jacolyn Reedy, PA-C at Dr. Fabio Bering office on 05/02/11 at 9:30am - Dr. Eden Emms will also be there that day. He will also need to continue ASA if okay with primary team and will primary team to write rx for NTG at discharge (0.4mg  SL q89min x 3, disp #25 with 2 refills). I contacted Dr. David Stall to discuss the plan as well. He may benefit from additional uptitration of his blood pressure medicine based on afternoon rounding blood pressure results, as he was hypertensive earlier this AM.  Ronie Spies PA-C

## 2011-04-14 NOTE — Progress Notes (Signed)
Patient ID: Jonathon [name], male   DOB: [birth year], 61 y.o.   MRN: [id number]    @ Subjective:  Denies SSCP, palpitations or Dyspnea Anxious to go home  Objective:  Filed Vitals:   04/13/11 1805 04/13/11 1905 04/13/11 2100 04/14/11 0500  BP: 184/100 164/90 144/75 164/92  Pulse:   62 50  Temp:   98.5 F (36.9 C) 98.5 F (36.9 C)  TempSrc:   Oral Oral  Resp:   18 18  Height:      Weight:    207 lb 6.4 oz (94.076 kg)  SpO2:   94% 93%    Intake/Output from previous day:  Intake/Output Summary (Last 24 hours) at 04/14/11 0855 Last data filed at 04/14/11 4098  Gross per 24 hour  Intake    960 ml  Output      0 ml  Net    960 ml    Physical Exam: Affect appropriate Healthy:  appears stated age HEENT: Hoarse voice Neck supple with no adenopathy JVP normal no bruits no thyromegaly Lungs S/P right thoracotomy with COPD no active wheezing Heart:  S1/S2 no murmur, no rub, gallop or click PMI normal Abdomen: benighn, BS positve, no tenderness, no AAA no bruit.  No HSM or HJR Distal pulses intact with no bruits No edema Neuro non-focal Skin warm and dry No muscular weakness   Lab Results: Basic Metabolic Panel:  Basename 04/13/11 1401 04/13/11 0159  NA 137 137  K 3.5 3.3*  CL 100 100  CO2 27 29  GLUCOSE 120* 104*  BUN 14 14  CREATININE 0.98 1.03  CALCIUM 9.4 8.9  MG -- --  PHOS -- --   Liver Function Tests: No results found for this basename: AST:2,ALT:2,ALKPHOS:2,BILITOT:2,PROT:2,ALBUMIN:2 in the last 72 hours No results found for this basename: LIPASE:2,AMYLASE:2 in the last 72 hours CBC:  Basename 04/12/11 1812 04/12/11 1008  WBC 9.6 8.5  NEUTROABS -- --  HGB 15.7 16.1  HCT 44.4 46.2  MCV 87.2 86.8  PLT 202 196   Cardiac Enzymes:  Basename 04/13/11 1045 04/13/11 0159 04/12/11 1753  CKTOTAL 77 83 84  CKMB 2.1 2.2 2.4  CKMBINDEX -- -- --  TROPONINI <0.30 <0.30 <0.30    Imaging: Dg Chest 2 View  04/12/2011  *RADIOLOGY REPORT*  Clinical  Data: Shortness of breath, chest pain.  CHEST - 2 VIEW  Comparison: 02/17/2011  Findings: There is hyperinflation of the lungs compatible with COPD.  Heart is normal size.  Densities in the lung bases likely reflects scarring.  No effusions.  No acute bony abnormality. Postoperative changes in the right hilum.  IMPRESSION: COPD/chronic changes.  No acute findings.  Original Report Authenticated By: Cyndie Chime, [name]   Ct Head Wo Contrast  04/12/2011  *RADIOLOGY REPORT*  Clinical Data: Headache.  CT HEAD WITHOUT CONTRAST  Technique:  Contiguous axial images were obtained from the base of the skull through the vertex without contrast.  Comparison: 02/21/2011  Findings: Previously seen left pontine hemorrhage is no longer visualized. There is atrophy and chronic small vessel disease changes. No acute intracranial abnormality.  Specifically, no hemorrhage, hydrocephalus, mass lesion, acute infarction, or significant intracranial injury.  No acute calvarial abnormality. Visualized paranasal sinuses and mastoids clear.  Orbital soft tissues unremarkable.  IMPRESSION: No acute intracranial abnormality.  Atrophy, chronic microvascular disease.  Original Report Authenticated By: Cyndie Chime, [name]    Cardiac Studies:  ECG:  NSR labile inferior T waves Orders placed in visit on 04/12/11  .  EKG 12-LEAD     Telemetry:  NSR no arrhythmia  Echo:   Medications:     . amLODipine  5 mg Oral Daily  . aspirin EC  325 mg Oral Daily  . aspirin  325 mg Oral STAT  . docusate sodium  100 mg Oral BID  . enoxaparin  40 mg Subcutaneous Q24H  . gabapentin  800 mg Oral TID  . hydrochlorothiazide  25 mg Oral Daily  . lisinopril  20 mg Oral BID  . metoprolol tartrate  50 mg Oral BID  . nortriptyline  25 mg Oral QHS  . potassium chloride  40 mEq Oral BID  . regadenoson  0.4 mg Intravenous Once  . senna-docusate  1 tablet Oral BID  . sodium chloride  3 mL Intravenous Q12H  . sodium chloride  3 mL Intravenous Q12H        Assessment/Plan:  Chest Pain:  Resolved R/O Myovue today.  Ok to D/C home if normal HTN:  Continue current meds.   Smoking:  COPD with previous surgery for BLEBS.  Smoking cessation has seen patient  Jonathon [name] 04/14/2011, 8:55 AM

## 2011-04-19 ENCOUNTER — Ambulatory Visit: Payer: Medicaid Other | Admitting: Physical Therapy

## 2011-04-19 ENCOUNTER — Encounter: Payer: Medicaid Other | Admitting: Occupational Therapy

## 2011-04-20 ENCOUNTER — Encounter: Payer: Self-pay | Admitting: Physical Medicine & Rehabilitation

## 2011-04-21 ENCOUNTER — Ambulatory Visit: Payer: Self-pay | Admitting: *Deleted

## 2011-04-21 ENCOUNTER — Ambulatory Visit: Payer: Medicaid Other | Admitting: Physical Therapy

## 2011-04-21 ENCOUNTER — Encounter: Payer: Medicaid Other | Admitting: Occupational Therapy

## 2011-04-26 ENCOUNTER — Encounter: Payer: Medicaid Other | Admitting: Occupational Therapy

## 2011-04-26 ENCOUNTER — Ambulatory Visit: Payer: Medicaid Other | Admitting: Physical Therapy

## 2011-04-29 ENCOUNTER — Encounter: Payer: Medicaid Other | Admitting: Occupational Therapy

## 2011-04-29 ENCOUNTER — Inpatient Hospital Stay (HOSPITAL_COMMUNITY)
Admission: EM | Admit: 2011-04-29 | Discharge: 2011-05-03 | DRG: 287 | Disposition: A | Discharge status: Home / Self Care | Payer: Medicaid Other | Attending: Cardiovascular Disease | Admitting: Cardiovascular Disease

## 2011-04-29 ENCOUNTER — Emergency Department (HOSPITAL_COMMUNITY): Payer: Medicaid Other

## 2011-04-29 ENCOUNTER — Ambulatory Visit: Payer: Medicaid Other | Admitting: Physical Therapy

## 2011-04-29 DIAGNOSIS — E1159 Type 2 diabetes mellitus with other circulatory complications: Secondary | ICD-10-CM

## 2011-04-29 DIAGNOSIS — I69998 Other sequelae following unspecified cerebrovascular disease: Secondary | ICD-10-CM

## 2011-04-29 DIAGNOSIS — Z7982 Long term (current) use of aspirin: Secondary | ICD-10-CM

## 2011-04-29 DIAGNOSIS — J4489 Other specified chronic obstructive pulmonary disease: Secondary | ICD-10-CM | POA: Diagnosis present

## 2011-04-29 DIAGNOSIS — F17200 Nicotine dependence, unspecified, uncomplicated: Secondary | ICD-10-CM | POA: Diagnosis present

## 2011-04-29 DIAGNOSIS — I152 Hypertension secondary to endocrine disorders: Secondary | ICD-10-CM

## 2011-04-29 DIAGNOSIS — F10929 Alcohol use, unspecified with intoxication, unspecified: Secondary | ICD-10-CM | POA: Diagnosis present

## 2011-04-29 DIAGNOSIS — F1010 Alcohol abuse, uncomplicated: Secondary | ICD-10-CM | POA: Diagnosis present

## 2011-04-29 DIAGNOSIS — R5383 Other fatigue: Secondary | ICD-10-CM | POA: Diagnosis present

## 2011-04-29 DIAGNOSIS — R0789 Other chest pain: Principal | ICD-10-CM | POA: Diagnosis present

## 2011-04-29 DIAGNOSIS — I619 Nontraumatic intracerebral hemorrhage, unspecified: Secondary | ICD-10-CM | POA: Insufficient documentation

## 2011-04-29 DIAGNOSIS — I10 Essential (primary) hypertension: Secondary | ICD-10-CM | POA: Diagnosis present

## 2011-04-29 DIAGNOSIS — R5381 Other malaise: Secondary | ICD-10-CM | POA: Diagnosis present

## 2011-04-29 DIAGNOSIS — I2510 Atherosclerotic heart disease of native coronary artery without angina pectoris: Secondary | ICD-10-CM | POA: Diagnosis present

## 2011-04-29 DIAGNOSIS — E876 Hypokalemia: Secondary | ICD-10-CM | POA: Diagnosis present

## 2011-04-29 DIAGNOSIS — J449 Chronic obstructive pulmonary disease, unspecified: Secondary | ICD-10-CM | POA: Diagnosis present

## 2011-04-29 DIAGNOSIS — G459 Transient cerebral ischemic attack, unspecified: Secondary | ICD-10-CM | POA: Diagnosis present

## 2011-04-29 LAB — BASIC METABOLIC PANEL
BUN: 12 mg/dL (ref 6–23)
CO2: 20 mEq/L (ref 19–32)
Calcium: 9 mg/dL (ref 8.4–10.5)
Chloride: 96 mEq/L (ref 96–112)
Creatinine, Ser: 1.14 mg/dL (ref 0.50–1.35)
GFR calc Af Amer: 78 mL/min — ABNORMAL LOW (ref >90)
GFR calc non Af Amer: 68 mL/min — ABNORMAL LOW (ref >90)
Glucose, Bld: 161 mg/dL — ABNORMAL HIGH (ref 70–99)
Potassium: 2.7 mEq/L — CL (ref 3.5–5.1)
Sodium: 136 mEq/L (ref 135–145)

## 2011-04-29 LAB — CBC
HCT: 47.3 % (ref 39.0–52.0)
Hemoglobin: 16.9 g/dL (ref 13.0–17.0)
MCH: 30.8 pg (ref 26.0–34.0)
MCHC: 35.7 g/dL (ref 30.0–36.0)
MCV: 86.2 fL (ref 78.0–100.0)
Platelets: 204 10*3/uL (ref 150–400)
RBC: 5.49 MIL/uL (ref 4.22–5.81)
RDW: 13.7 % (ref 11.5–15.5)
WBC: 11.3 10*3/uL — ABNORMAL HIGH (ref 4.0–10.5)

## 2011-04-29 LAB — TROPONIN I: Troponin I: <0.3 ng/mL (ref <0.30)

## 2011-04-29 MED ORDER — ONDANSETRON HCL 4 MG/2ML IJ SOLN
4.0000 mg | Freq: Once | INTRAMUSCULAR | Status: AC
  Administered 2011-04-29: 4 mg via INTRAVENOUS
  Filled 2011-04-29: qty 2

## 2011-04-29 MED ORDER — ASPIRIN 81 MG PO CHEW
324.0000 mg | CHEWABLE_TABLET | Freq: Once | ORAL | Status: AC
  Administered 2011-04-29: 324 mg via ORAL
  Filled 2011-04-29: qty 4

## 2011-04-29 MED ORDER — POTASSIUM CHLORIDE CRYS ER 20 MEQ PO TBCR
40.0000 meq | EXTENDED_RELEASE_TABLET | Freq: Once | ORAL | Status: AC
  Administered 2011-04-30: 40 meq via ORAL
  Filled 2011-04-29: qty 2

## 2011-04-29 NOTE — ED Notes (Signed)
Report received from EMS, EDPA at [facility]. Alert, NAD, calm, interactive. Pt seen by EDPA prior to RN assessment, see  PA notes, orders received and intitated. Family at [facility], lab and xray at door.

## 2011-04-29 NOTE — ED Notes (Signed)
Back from xray. No changes, family at [facility].

## 2011-04-29 NOTE — ED Notes (Signed)
Blood drawn, pt to xray.

## 2011-04-29 NOTE — ED Notes (Signed)
Pt here by EMS, pt reported as intoxicated, c/o CP, transported only, recent admission noted for CVA. EMS reports difficult vague exam. Family at [facility].

## 2011-04-29 NOTE — ED Provider Notes (Signed)
History     CSN: [id number]  Arrival date & time 04/29/11  2220   First MD Initiated Contact with Patient 04/29/11 2223      Chief Complaint  Patient presents with  . Chest Pain    (Consider location/radiation/quality/duration/timing/severity/associated sxs/prior treatment) HPI  Pt presents to the ED with complaints of chest pain that started at 10:00pm tonight. He was admitted to the hospital recently and opted to do an outpatient cath. He is supposed to have the cath done in 2 days on 05-02-2011. He is acting intoxicated and smells strongly of alcohol.  The wife admits to them having drank a lot of alcohol tonight. She approximates a 1/5 of liquor. The patient is heaving in the exam room and refuses to answer any of my questions at this time. Unable to obtain a thorough HPI due to intoxication.  Pt given 324 aspirin since arriving in ED. Pt currently chest pain free. Pt has a hx of recent stroke.  Past Medical History  Diagnosis Date  . CHF (congestive heart failure)   . COPD (chronic obstructive pulmonary disease)   . Hypertension   . Smoker   . Stroke   . Chest pain   . Shortness of breath     Past Surgical History  Procedure Date  . Hernia repair   . Lobectomy     No family history on file.  History  Substance Use Topics  . Smoking status: Current Everyday Smoker -- 1.0 packs/day for 45 years    Types: Cigarettes  . Smokeless tobacco: Never Used  . Alcohol Use: No      Review of Systems  All other systems reviewed and are negative.    Allergies  Review of patient's allergies indicates no known allergies.  Home Medications   Current Outpatient Rx  Name Route Sig Dispense Refill  . AMLODIPINE BESYLATE 10 MG PO TABS Oral Take 10 mg by mouth daily.    . ASPIRIN EC 81 MG PO TBEC Oral Take 81 mg by mouth daily.    Marland Kitchen CLONIDINE HCL 0.3 MG PO TABS Oral Take 0.3 mg by mouth 2 (two) times daily.    Marland Kitchen GABAPENTIN 400 MG PO CAPS Oral Take 800 mg by mouth 3 (three)  times daily.    Marland Kitchen HYDROCHLOROTHIAZIDE 25 MG PO TABS Oral Take 25 mg by mouth daily.    Marland Kitchen LISINOPRIL 20 MG PO TABS Oral Take 20 mg by mouth 2 (two) times daily.    Marland Kitchen METOPROLOL SUCCINATE ER 100 MG PO TB24 Oral Take 100 mg by mouth daily. Take with or immediately following a meal. Time taken unknown.    Marland Kitchen NITROGLYCERIN 0.4 MG SL SUBL Sublingual Place 1 tablet (0.4 mg total) under the tongue every 5 (five) minutes as needed. For chest pain 30 tablet 0  . NORTRIPTYLINE HCL 25 MG PO CAPS Oral Take 25 mg by mouth at bedtime.      BP 135/76  Pulse 86  Temp(Src) 97.6 F (36.4 C) (Oral)  Resp 20  SpO2 98%  Physical Exam  Nursing note and vitals reviewed. Constitutional: He appears well-developed and well-nourished. No distress.       Pt intoxicated  HENT:  Head: Normocephalic and atraumatic.  Eyes: Pupils are equal, round, and reactive to light.  Neck: Normal range of motion. Neck supple.  Cardiovascular: Normal rate and regular rhythm.   Pulmonary/Chest: Effort normal. No respiratory distress. He has no wheezes.  Abdominal: Soft.  Neurological: He is alert.  Skin: Skin is warm and dry. He is not diaphoretic.    ED Course  Procedures (including critical care time)  Labs Reviewed  CBC - Abnormal; Notable for the following:    WBC 11.3 (*)    All other components within normal limits  BASIC METABOLIC PANEL - Abnormal; Notable for the following:    Potassium 2.7 (*)    Glucose, Bld 161 (*)    GFR calc non Af Amer 68 (*)    GFR calc Af Amer 78 (*)    All other components within normal limits  TROPONIN I   Dg Chest 2 View  04/29/2011  *RADIOLOGY REPORT*  Clinical Data: Chest pain.  CHEST - 2 VIEW  Comparison: 04/12/2011  Findings: Decreased lung volumes are seen with development of bibasilar atelectasis.  No evidence of pulmonary consolidation or pleural effusion.  Heart size is within normal limits.  Pulmonary emphysema again demonstrated.  Surgical clips again noted in the right  hilum.  IMPRESSION:  1.  Decreased lung volumes with development of bibasilar atelectasis. 2.  Bullous emphysema.  Original Report Authenticated By: Danae Orleans, [name]     1. Chest pain       MDM   Date: 04/29/2011  Rate: 85  Rhythm: normal sinus rhythm  QRS Axis: left  Intervals: normal  ST/T Wave abnormalities: normal  Conduction Disutrbances:none  Narrative Interpretation:    Old EKG Reviewed: unchanged from April 12, 2011   Pt after being chest pain free is no longer acting belligerent. He is resting comfortably and responding appropriately to my questioning and still remains chest pain free and is agreeable to being admitted.   I have discussed the patient with Dr. Norlene Campbell who agrees that the patient needs to be admitted to the hospital.  [facility] hospitalists have agreed to admit patient. They are coming to see patient right now for further admission        Dorthula Matas, [place] 04/30/11 0003

## 2011-04-29 NOTE — ED Notes (Signed)
Denies pain, states, "just don't feel well", admits to some sob ( a little) and nausea, given ASA. Son at [facility]. difficutlto obtain information from pt.

## 2011-04-30 ENCOUNTER — Encounter (HOSPITAL_COMMUNITY): Payer: Self-pay | Admitting: Family Medicine

## 2011-04-30 ENCOUNTER — Inpatient Hospital Stay (HOSPITAL_COMMUNITY): Payer: Medicaid Other

## 2011-04-30 DIAGNOSIS — E876 Hypokalemia: Secondary | ICD-10-CM | POA: Diagnosis present

## 2011-04-30 DIAGNOSIS — R079 Chest pain, unspecified: Secondary | ICD-10-CM

## 2011-04-30 DIAGNOSIS — I619 Nontraumatic intracerebral hemorrhage, unspecified: Secondary | ICD-10-CM | POA: Insufficient documentation

## 2011-04-30 DIAGNOSIS — I152 Hypertension secondary to endocrine disorders: Secondary | ICD-10-CM

## 2011-04-30 DIAGNOSIS — I10 Essential (primary) hypertension: Secondary | ICD-10-CM

## 2011-04-30 DIAGNOSIS — R0789 Other chest pain: Secondary | ICD-10-CM | POA: Diagnosis present

## 2011-04-30 DIAGNOSIS — F10929 Alcohol use, unspecified with intoxication, unspecified: Secondary | ICD-10-CM | POA: Diagnosis present

## 2011-04-30 LAB — BASIC METABOLIC PANEL
BUN: 10 mg/dL (ref 6–23)
CO2: 23 mEq/L (ref 19–32)
Calcium: 8.7 mg/dL (ref 8.4–10.5)
Chloride: 100 mEq/L (ref 96–112)
Creatinine, Ser: 0.89 mg/dL (ref 0.50–1.35)
GFR calc Af Amer: >90 mL/min (ref >90)
GFR calc non Af Amer: >90 mL/min (ref >90)
Glucose, Bld: 104 mg/dL — ABNORMAL HIGH (ref 70–99)
Potassium: 3.7 mEq/L (ref 3.5–5.1)
Sodium: 138 mEq/L (ref 135–145)

## 2011-04-30 LAB — ETHANOL: Alcohol, Ethyl (B): 139 mg/dL — ABNORMAL HIGH (ref 0–11)

## 2011-04-30 LAB — LIPID PANEL
Cholesterol: 103 mg/dL (ref 0–200)
HDL: 32 mg/dL — ABNORMAL LOW (ref >39)
LDL Cholesterol: 56 mg/dL (ref 0–99)
Total CHOL/HDL Ratio: 3.2 RATIO
Triglycerides: 76 mg/dL (ref <150)
VLDL: 15 mg/dL (ref 0–40)

## 2011-04-30 LAB — CBC
HCT: 44.3 % (ref 39.0–52.0)
Hemoglobin: 15.8 g/dL (ref 13.0–17.0)
MCH: 30.4 pg (ref 26.0–34.0)
MCHC: 35.7 g/dL (ref 30.0–36.0)
MCV: 85.4 fL (ref 78.0–100.0)
Platelets: 194 10*3/uL (ref 150–400)
RBC: 5.19 MIL/uL (ref 4.22–5.81)
RDW: 13.7 % (ref 11.5–15.5)
WBC: 9.4 10*3/uL (ref 4.0–10.5)

## 2011-04-30 LAB — MAGNESIUM: Magnesium: 1.8 mg/dL (ref 1.5–2.5)

## 2011-04-30 LAB — TROPONIN I
Troponin I: <0.3 ng/mL (ref <0.30)
Troponin I: <0.3 ng/mL (ref <0.30)
Troponin I: <0.3 ng/mL (ref <0.30)

## 2011-04-30 MED ORDER — ACETAMINOPHEN 650 MG RE SUPP: 650.0000 mg | Freq: Four times a day (QID) | RECTAL | Status: DC | PRN

## 2011-04-30 MED ORDER — ATORVASTATIN CALCIUM 40 MG PO TABS
40.0000 mg | ORAL_TABLET | Freq: Every day | ORAL | Status: DC
  Administered 2011-04-30 – 2011-05-02 (×3): 40 mg via ORAL
  Filled 2011-04-30 (×4): qty 1

## 2011-04-30 MED ORDER — ASPIRIN 325 MG PO TABS
325.0000 mg | ORAL_TABLET | Freq: Every day | ORAL | Status: DC
  Administered 2011-04-30 – 2011-05-03 (×3): 325 mg via ORAL
  Filled 2011-04-30 (×4): qty 1

## 2011-04-30 MED ORDER — ACETAMINOPHEN 325 MG PO TABS: 650.0000 mg | ORAL_TABLET | Freq: Four times a day (QID) | ORAL | Status: DC | PRN

## 2011-04-30 MED ORDER — HYDROCODONE-ACETAMINOPHEN 5-325 MG PO TABS
1.0000 | ORAL_TABLET | ORAL | Status: DC | PRN
  Administered 2011-05-02: 1 via ORAL
  Filled 2011-04-30: qty 1

## 2011-04-30 MED ORDER — ALBUTEROL SULFATE (5 MG/ML) 0.5% IN NEBU: 2.5000 mg | INHALATION_SOLUTION | RESPIRATORY_TRACT | Status: DC | PRN

## 2011-04-30 MED ORDER — METOPROLOL SUCCINATE ER 100 MG PO TB24
100.0000 mg | ORAL_TABLET | Freq: Every day | ORAL | Status: DC
  Administered 2011-04-30: 100 mg via ORAL
  Filled 2011-04-30 (×3): qty 1

## 2011-04-30 MED ORDER — GABAPENTIN 400 MG PO CAPS
800.0000 mg | ORAL_CAPSULE | Freq: Three times a day (TID) | ORAL | Status: DC
  Administered 2011-04-30 – 2011-05-03 (×9): 800 mg via ORAL
  Filled 2011-04-30 (×12): qty 2

## 2011-04-30 MED ORDER — ENOXAPARIN SODIUM 40 MG/0.4ML ~~LOC~~ SOLN
40.0000 mg | SUBCUTANEOUS | Status: DC
  Administered 2011-04-30 – 2011-05-01 (×2): 40 mg via SUBCUTANEOUS
  Filled 2011-04-30 (×4): qty 0.4

## 2011-04-30 MED ORDER — FOLIC ACID 1 MG PO TABS
1.0000 mg | ORAL_TABLET | Freq: Every day | ORAL | Status: DC
  Administered 2011-04-30 – 2011-05-02 (×3): 1 mg via ORAL
  Filled 2011-04-30 (×4): qty 1

## 2011-04-30 MED ORDER — NICOTINE 14 MG/24HR TD PT24
14.0000 mg | MEDICATED_PATCH | Freq: Every day | TRANSDERMAL | Status: DC
  Administered 2011-04-30 – 2011-05-03 (×4): 14 mg via TRANSDERMAL
  Filled 2011-04-30 (×4): qty 1

## 2011-04-30 MED ORDER — LISINOPRIL 20 MG PO TABS
20.0000 mg | ORAL_TABLET | Freq: Two times a day (BID) | ORAL | Status: DC
  Administered 2011-04-30 – 2011-05-03 (×7): 20 mg via ORAL
  Filled 2011-04-30 (×8): qty 1

## 2011-04-30 MED ORDER — AMLODIPINE BESYLATE 10 MG PO TABS
10.0000 mg | ORAL_TABLET | Freq: Every day | ORAL | Status: DC
  Administered 2011-04-30 – 2011-05-03 (×4): 10 mg via ORAL
  Filled 2011-04-30 (×4): qty 1

## 2011-04-30 MED ORDER — NITROGLYCERIN 0.4 MG SL SUBL: 0.4000 mg | SUBLINGUAL_TABLET | SUBLINGUAL | Status: DC | PRN

## 2011-04-30 MED ORDER — CLONIDINE HCL 0.3 MG PO TABS
0.3000 mg | ORAL_TABLET | Freq: Two times a day (BID) | ORAL | Status: DC
  Administered 2011-04-30 – 2011-05-03 (×8): 0.3 mg via ORAL
  Filled 2011-04-30 (×9): qty 1

## 2011-04-30 MED ORDER — IPRATROPIUM BROMIDE 0.02 % IN SOLN: 0.5000 mg | RESPIRATORY_TRACT | Status: DC | PRN

## 2011-04-30 MED ORDER — SODIUM CHLORIDE 0.9 % IJ SOLN
3.0000 mL | Freq: Two times a day (BID) | INTRAMUSCULAR | Status: DC
  Administered 2011-04-30 – 2011-05-03 (×6): 3 mL via INTRAVENOUS

## 2011-04-30 MED ORDER — POTASSIUM CHLORIDE IN NACL 20-0.9 MEQ/L-% IV SOLN
INTRAVENOUS | Status: DC
  Administered 2011-04-30 (×2): via INTRAVENOUS
  Filled 2011-04-30 (×8): qty 1000

## 2011-04-30 MED ORDER — VITAMIN B-1 100 MG PO TABS
100.0000 mg | ORAL_TABLET | Freq: Every day | ORAL | Status: DC
  Administered 2011-04-30 – 2011-05-03 (×4): 100 mg via ORAL
  Filled 2011-04-30 (×4): qty 1

## 2011-04-30 MED ORDER — HYDROCHLOROTHIAZIDE 25 MG PO TABS
25.0000 mg | ORAL_TABLET | Freq: Every day | ORAL | Status: DC
  Administered 2011-04-30 – 2011-05-03 (×4): 25 mg via ORAL
  Filled 2011-04-30 (×4): qty 1

## 2011-04-30 MED ORDER — NORTRIPTYLINE HCL 25 MG PO CAPS
25.0000 mg | ORAL_CAPSULE | Freq: Every day | ORAL | Status: DC
  Administered 2011-04-30 – 2011-05-02 (×3): 25 mg via ORAL
  Filled 2011-04-30 (×4): qty 1

## 2011-04-30 NOTE — Consult Note (Signed)
CARDIOLOGY CONSULT NOTE    Patient ID: Jonathon [name] MRN: [id number] DOB/AGE: 06-11-1950 61 y.o.  Admit date: 04/29/2011 Referring Physician: Robb [name] Primary Physician: Jonathon Dials, MD, MD Primary Cardiologist: Jonathon [name] Reason for Consultation: Recurrent Chest pain  Principal Problem:  *Chest pain Active Problems:  COPD (chronic obstructive pulmonary disease)  Alcohol intoxication  Hypertension  History of CVA (cerebrovascular accident)  Hypokalemia   HPI:  61 year old [name] gentleman who presented  3/26 with chest pain onset in  morning. The patient describes the pain as being a heaviness on his chest in the substernal region nonradiating. He rates the pain as a 6/10. He did not take anything for the pain and is unable to identify any provocative or palliative features. Currently pain free and asymptomatic Of note the patient has had a recent pontine hemorrhagic stroke with residual right-sided weakness which is unchanged since his discharge from rehabilitation. The patient also has some vocal cord paralysis from the past which renders him hoarse when he speaking. The patient however has had no change since his stroke and he has had no difficulty with even drinking.  The patient denies any fever, chills, nausea, vomiting, diarrhea, dizziness, near syncope, cough. The patient is a pack per day tobacco user for greater than 45 years. He has had a previous right thoracotomy for blebs and has some chronic exertional dyspnea. Enzymes negative and had normal EF by echo 1/13 when he had his CVA He did not want to stay in hospital and outpatient F/U with our group was scheduled for 4/15.  Readmitted with chest pain.  Midsternal.  Not related to food.  ETOH level high but he indicates infrequent drinking Also had pain radiating to right arm with dyspnea and clammyness.  ? Swelling in right arm and leg.  Admission ECG normal and enzymes negative.  Stroke 1/31 was pontine and hemorragic.   Likely related to HTN.  Dr Jonathon [name] saw in hospital at that time as well as Jonathon [name]   @ROS @ All other systems reviewed and negative except as noted above  Past Medical History  Diagnosis Date  . CHF (congestive heart failure)   . COPD (chronic obstructive pulmonary disease)   . Hypertension   . Smoker   . Stroke   . Chest pain   . Shortness of breath     Family History  Problem Relation Age of Onset  . Hypertension      History   Social History  . Marital Status: Married    Spouse Name: [name]    Number of Children: [name]  . Years of Education: [name]   Occupational History  . Not on file.   Social History Main Topics  . Smoking status: Current Everyday Smoker -- 1.0 packs/day for 45 years    Types: Cigarettes  . Smokeless tobacco: Never Used  . Alcohol Use: Yes  . Drug Use: No  . Sexually Active: Yes   Other Topics Concern  . Not on file   Social History Narrative  . No narrative on file    Past Surgical History  Procedure Date  . Hernia repair   . Lobectomy         . amLODipine  10 mg Oral Daily  . aspirin  324 mg Oral Once  . aspirin  325 mg Oral Daily  . atorvastatin  40 mg Oral q1800  . cloNIDine  0.3 mg Oral BID  . enoxaparin  40 mg Subcutaneous Q24H  . folic acid  1 mg Oral  Daily  . gabapentin  800 mg Oral TID  . hydrochlorothiazide  25 mg Oral Daily  . lisinopril  20 mg Oral BID  . metoprolol succinate  100 mg Oral Daily  . nicotine  14 mg Transdermal Daily  . nortriptyline  25 mg Oral QHS  . ondansetron  4 mg Intravenous Once  . potassium chloride  40 mEq Oral Once  . sodium chloride  3 mL Intravenous Q12H  . thiamine  100 mg Oral Daily      . 0.9 % NaCl with KCl 20 mEq / L 75 mL/hr at 04/30/11 0438    Physical Exam: Blood pressure 128/78, pulse 63, temperature 98.6 F (37 C), temperature source Oral, resp. rate 18, height 5\' 10"  (1.778 m), weight 204 lb 12.9 oz (92.9 kg), SpO2 96.00%.  Chronically ill overweight e[place]ian male Hoarse  voice from focal cord paralysis S/P right pneumonectomy S1/S2 distant BS positive Mild weakness RU/RLE  PT plus one bilaterally Skin warm and dry   Labs:   Lab Results  Component Value Date   WBC 9.4 04/30/2011   HGB 15.8 04/30/2011   HCT 44.3 04/30/2011   MCV 85.4 04/30/2011   PLT 194 04/30/2011    Lab 04/30/11 0401  NA 138  K 3.7  CL 100  CO2 23  BUN 10  CREATININE 0.89  CALCIUM 8.7  PROT --  BILITOT --  ALKPHOS --  ALT --  AST --  GLUCOSE 104*   Lab Results  Component Value Date   CKTOTAL 77 04/13/2011   CKMB 2.1 04/13/2011   TROPONINI <0.30 04/30/2011    Lab Results  Component Value Date   CHOL 103 04/30/2011   CHOL 133 02/18/2011   CHOL  Value: 123        ATP III CLASSIFICATION:  <200     mg/dL   Desirable  161-096  mg/dL   Borderline High  >=045    mg/dL   High        4/0/9811   Lab Results  Component Value Date   HDL 32* 04/30/2011   HDL 50 02/18/2011   HDL 45 09/25/2009   Lab Results  Component Value Date   LDLCALC 56 04/30/2011   LDLCALC 72 02/18/2011   LDLCALC  Value: 60        Total Cholesterol/HDL:CHD Risk Coronary Heart Disease Risk Table                     Men   Women  1/2 Average Risk   3.4   3.3  Average Risk       5.0   4.4  2 X Average Risk   9.6   7.1  3 X Average Risk  23.4   11.0        Use the calculated Patient Ratio above and the CHD Risk Table to determine the patient's CHD Risk.        ATP III CLASSIFICATION (LDL):  <100     mg/dL   Optimal  914-782  mg/dL   Near or Above                    Optimal  130-159  mg/dL   Borderline  956-213  mg/dL   High  >086     mg/dL   Very High 05/24/8467   Lab Results  Component Value Date   TRIG 76 04/30/2011   TRIG 56 02/18/2011   TRIG 91 09/25/2009  Lab Results  Component Value Date   CHOLHDL 3.2 04/30/2011   CHOLHDL 2.7 02/18/2011   CHOLHDL 2.7 09/25/2009   No results found for this basename: LDLDIRECT      Radiology: Dg Chest 2 View  04/29/2011  *RADIOLOGY REPORT*  Clinical Data: Chest pain.  CHEST - 2 VIEW   Comparison: 04/12/2011  Findings: Decreased lung volumes are seen with development of bibasilar atelectasis.  No evidence of pulmonary consolidation or pleural effusion.  Heart size is within normal limits.  Pulmonary emphysema again demonstrated.  Surgical clips again noted in the right hilum.  IMPRESSION:  1.  Decreased lung volumes with development of bibasilar atelectasis. 2.  Bullous emphysema.  Original Report Authenticated By: Danae Orleans, [name]   Dg Chest 2 View  04/12/2011  *RADIOLOGY REPORT*  Clinical Data: Shortness of breath, chest pain.  CHEST - 2 VIEW  Comparison: 02/17/2011  Findings: There is hyperinflation of the lungs compatible with COPD.  Heart is normal size.  Densities in the lung bases likely reflects scarring.  No effusions.  No acute bony abnormality. Postoperative changes in the right hilum.  IMPRESSION: COPD/chronic changes.  No acute findings.  Original Report Authenticated By: Cyndie Chime, [name]   Ct Head Wo Contrast  04/12/2011  *RADIOLOGY REPORT*  Clinical Data: Headache.  CT HEAD WITHOUT CONTRAST  Technique:  Contiguous axial images were obtained from the base of the skull through the vertex without contrast.  Comparison: 02/21/2011  Findings: Previously seen left pontine hemorrhage is no longer visualized. There is atrophy and chronic small vessel disease changes. No acute intracranial abnormality.  Specifically, no hemorrhage, hydrocephalus, mass lesion, acute infarction, or significant intracranial injury.  No acute calvarial abnormality. Visualized paranasal sinuses and mastoids clear.  Orbital soft tissues unremarkable.  IMPRESSION: No acute intracranial abnormality.  Atrophy, chronic microvascular disease.  Original Report Authenticated By: Cyndie Chime, [name]   Nm Myocar Multi W/spect W/wall Motion / Ef  04/14/2011  Lexiscan Myovue:  Indication: Chest Pain  The patient was given .4 mg of Lexiscan as a bolus for stress images.  Resting ECG showed SR rate 48 LVH with  strain.  No changes with infusion. BP stable at 159/91. No SSCP.  Images were reconstructed in the vetical , horizontal and short axis views.  Resting and stress images showed a small inferolateral wall infarct at mid and basal levels with no ischemia.  SDS 7 abnormal in septum.  EF 55% with no definitive RWMA  Impression:  Small inferolateral wall infarct mid and basal level no ischemia.  EF 55%  [name] MD [facility]  Original Report Authenticated By: Marita Snellen    EKG: NSR normal ECG no ischemic changes   ASSESSMENT AND PLAN:  Chest Pain.  Recurrent SSCP requiring hospitalization:  Favor diagnostic cath Monday.  Discussed with patient and wife.  Will need neuro to weigh in on safety of anticoagulation given pontine hemorrage on 1/31 ASA for now uncless enzymes become positive or ECG changes HTN:  Improved continue current meds CVA:  Some sensory issues in arm but good motor recovery since rehab COPD:  With vocal cord issues and pneumonectomy  Limited pulmonary reserve  Signed: [name] 04/30/2011, 2:10 PM

## 2011-04-30 NOTE — ED Notes (Signed)
Report called to primary RN [id number], preparing to transport. Up with tele-tech, denies sx at this time.

## 2011-04-30 NOTE — H&P (Signed)
PCP:   Aura Dials, MD, MD   Chief Complaint:  Chest pains  HPI: This is a 61 year old male that was brought in after he developed chest pains at approximately 9 PM. There is report of the patient not breathing well, he was nauseous, vomiting -a single episode. He was clammy. Chest pain was centrally located and. There is no report of palpitation no presyncopal. He reports of fevers or chills. Patient has COPD with history of lobectomy. He has chronic shortness of breath. But today his dyspnea was worse. He is a chronic wheeze which was unchanged. History provided by the patient's wife was intoxicated. Per wife the patient has not taken his medication for the past 2 days and has been drinking steadily for the past 2 days. She states he usually does not drink. The patient is alert and oriented, however, declined to inputs in the conversation. The patient was recently admitted for chest pains and was scheduled to have a left heart cath done on the 15th of this month.  Review of Systems:  Unable to obtain  Past Medical History: Past Medical History  Diagnosis Date  . CHF (congestive heart failure)   . COPD (chronic obstructive pulmonary disease)   . Hypertension   . Smoker   . Stroke   . Chest pain   . Shortness of breath    Past Surgical History  Procedure Date  . Hernia repair   . Lobectomy     Medications: Prior to Admission medications   Medication Sig Start Date End Date Taking? Authorizing Provider  amLODipine (NORVASC) 10 MG tablet Take 10 mg by mouth daily. 04/14/11 04/13/12 Yes Marinda Elk, MD  aspirin EC 81 MG tablet Take 81 mg by mouth daily.   Yes Historical Provider, MD  cloNIDine (CATAPRES) 0.3 MG tablet Take 0.3 mg by mouth 2 (two) times daily.   Yes Historical Provider, MD  gabapentin (NEURONTIN) 400 MG capsule Take 800 mg by mouth 3 (three) times daily. 03/03/11 03/02/12 Yes Evlyn Kanner Love, PA  hydrochlorothiazide (HYDRODIURIL) 25 MG tablet Take 25 mg by mouth  daily. 03/03/11 03/02/12 Yes Evlyn Kanner Love, PA  lisinopril (PRINIVIL,ZESTRIL) 20 MG tablet Take 20 mg by mouth 2 (two) times daily. 03/03/11 03/02/12 Yes Evlyn Kanner Love, PA  metoprolol succinate (TOPROL-XL) 100 MG 24 hr tablet Take 100 mg by mouth daily. Take with or immediately following a meal. Time taken unknown.   Yes Historical Provider, MD  nitroGLYCERIN (NITROSTAT) 0.4 MG SL tablet Place 1 tablet (0.4 mg total) under the tongue every 5 (five) minutes as needed. For chest pain 04/14/11  Yes Marinda Elk, MD  nortriptyline (PAMELOR) 25 MG capsule Take 25 mg by mouth at bedtime. 03/03/11 03/02/12 Yes Evlyn Kanner Love, PA    Allergies:  No Known Allergies  Social History:  reports that he has been smoking Cigarettes.  He has a 45 pack-year smoking history. He has never used smokeless tobacco. He reports that he drinks alcohol. He reports that he does not use illicit drugs.  Family History: Family History  Problem Relation Age of Onset  . Hypertension      Physical Exam: Filed Vitals:   04/29/11 2227 04/29/11 2330  BP: 135/83 135/76  Pulse: 86   Temp: 97.6 F (36.4 C)   TempSrc: Oral   Resp: 20 20  SpO2: 96% 98%    General:  Alert and oriented times three, well developed and nourished, no acute distress Eyes: PERRLA, pink conjunctiva, no scleral  icterus ENT: Moist oral mucosa, neck supple, no thyromegaly Lungs: clear to ascultation, no wheeze, no crackles, no use of accessory muscles Cardiovascular: regular rate and rhythm, no regurgitation, no gallops, no murmurs. No carotid bruits, no JVD Abdomen: soft, positive BS, non-tender, non-distended, no organomegaly, not an acute abdomen GU: not examined Neuro: CN II - XII grossly intact, sensation intact Musculoskeletal: strength 5/5 all extremities, no clubbing, cyanosis or edema Skin: no rash, no subcutaneous crepitation, no decubitus Psych: appropriate patient   Labs on Admission:   [facility] 04/29/11 2229  NA 136  K 2.7*    CL 96  CO2 20  GLUCOSE 161*  BUN 12  CREATININE 1.14  CALCIUM 9.0  MG --  PHOS --   No results found for this basename: AST:2,ALT:2,ALKPHOS:2,BILITOT:2,PROT:2,ALBUMIN:2 in the last 72 hours No results found for this basename: LIPASE:2,AMYLASE:2 in the last 72 hours  Basename 04/29/11 2229  WBC 11.3*  NEUTROABS --  HGB 16.9  HCT 47.3  MCV 86.2  PLT 204    Basename 04/29/11 2229  CKTOTAL --  CKMB --  CKMBINDEX --  TROPONINI <0.30   No components found with this basename: POCBNP:3 No results found for this basename: DDIMER:2 in the last 72 hours No results found for this basename: HGBA1C:2 in the last 72 hours No results found for this basename: CHOL:2,HDL:2,LDLCALC:2,TRIG:2,CHOLHDL:2,LDLDIRECT:2 in the last 72 hours No results found for this basename: TSH,T4TOTAL,FREET3,T3FREE,THYROIDAB in the last 72 hours No results found for this basename: VITAMINB12:2,FOLATE:2,FERRITIN:2,TIBC:2,IRON:2,RETICCTPCT:2 in the last 72 hours  Micro Results: No results found for this or any previous visit (from the past 240 hour(s)).   Radiological Exams on Admission: Dg Chest 2 View  04/29/2011  *RADIOLOGY REPORT*  Clinical Data: Chest pain.  CHEST - 2 VIEW  Comparison: 04/12/2011  Findings: Decreased lung volumes are seen with development of bibasilar atelectasis.  No evidence of pulmonary consolidation or pleural effusion.  Heart size is within normal limits.  Pulmonary emphysema again demonstrated.  Surgical clips again noted in the right hilum.  IMPRESSION:  1.  Decreased lung volumes with development of bibasilar atelectasis. 2.  Bullous emphysema.  Original Report Authenticated By: Danae Orleans, [name]     EKG: Normal sinus rhythm  Assessment/Plan Present on Admission:  .Chest pain Admit to telemetry Aspirin, beta blocker, resume home medications  cycle cardiac enzymes   consult [facility] cardiology in the a.m. DVT prophylaxis and nitroglycerin when necessary  Tobacco  abuse Alcohol abuse Nicotine patch and nebulizers ordered Will order alcohol level, if positive patient likely will need  CIWA protocol  .Hypokalemia Repeat in the ER and with IV fluids Check magnesium level  Full code DVT prophylaxis Team 2/Dr. Lennie Odor, [facility] 04/30/2011, 1:14 AM

## 2011-04-30 NOTE — ED Provider Notes (Signed)
Medical screening examination/treatment/procedure(s) were performed by non-physician practitioner and as supervising physician I was immediately available for consultation/collaboration.  Naudia Crosley M Ailey Wessling, MD 04/30/11 0119 

## 2011-04-30 NOTE — Progress Notes (Signed)
Subjective:  No chest pain or SOB today.  Objective: Filed Vitals:   04/30/11 0245 04/30/11 0312 04/30/11 0649 04/30/11 1107  BP:  157/80 124/78 141/87  Pulse: 88 86 76   Temp:  97.4 F (36.3 C) 98.6 F (37 C)   TempSrc:      Resp:  18 18   Height:  5\' 10"  (1.778 m)    Weight:  92.9 kg (204 lb 12.9 oz)    SpO2: 97% 92% 93%    Weight change:   Intake/Output Summary (Last 24 hours) at 04/30/11 1303 Last data filed at 04/30/11 0919  Gross per 24 hour  Intake   1320 ml  Output      0 ml  Net   1320 ml    General: Alert, awake, oriented x3, in no acute distress.  HEENT: No bruits, no goiter.  Heart: Regular rate and rhythm, without murmurs, rubs, gallops.  Lungs: Good air movement CTA B/L.  Abdomen: Soft, nontender, nondistended, positive bowel sounds.  Neuro: Grossly intact, nonfocal.   Lab Results:  [facility] 04/30/11 0401 04/29/11 2229  NA 138 136  K 3.7 2.7*  CL 100 96  CO2 23 20  GLUCOSE 104* 161*  BUN 10 12  CREATININE 0.89 1.14  CALCIUM 8.7 9.0  MG 1.8 --  PHOS -- --   No results found for this basename: AST:2,ALT:2,ALKPHOS:2,BILITOT:2,PROT:2,ALBUMIN:2 in the last 72 hours No results found for this basename: LIPASE:2,AMYLASE:2 in the last 72 hours  Basename 04/30/11 0401 04/29/11 2229  WBC 9.4 11.3*  NEUTROABS -- --  HGB 15.8 16.9  HCT 44.3 47.3  MCV 85.4 86.2  PLT 194 204    Basename 04/30/11 0847 04/30/11 0401 04/29/11 2229  CKTOTAL -- -- --  CKMB -- -- --  CKMBINDEX -- -- --  TROPONINI <0.30 <0.30 <0.30   No components found with this basename: POCBNP:3 No results found for this basename: DDIMER:2 in the last 72 hours No results found for this basename: HGBA1C:2 in the last 72 hours  Basename 04/30/11 0847  CHOL 103  HDL 32*  LDLCALC 56  TRIG 76  CHOLHDL 3.2  LDLDIRECT --   No results found for this basename: TSH,T4TOTAL,FREET3,T3FREE,THYROIDAB in the last 72 hours No results found for this basename:  VITAMINB12:2,FOLATE:2,FERRITIN:2,TIBC:2,IRON:2,RETICCTPCT:2 in the last 72 hours  Micro Results: No results found for this or any previous visit (from the past 240 hour(s)).  Studies/Results: Dg Chest 2 View  04/29/2011  *RADIOLOGY REPORT*  Clinical Data: Chest pain.  CHEST - 2 VIEW  Comparison: 04/12/2011  Findings: Decreased lung volumes are seen with development of bibasilar atelectasis.  No evidence of pulmonary consolidation or pleural effusion.  Heart size is within normal limits.  Pulmonary emphysema again demonstrated.  Surgical clips again noted in the right hilum.  IMPRESSION:  1.  Decreased lung volumes with development of bibasilar atelectasis. 2.  Bullous emphysema.  Original Report Authenticated By: Danae Orleans, [name]    Medications: I have reviewed the patient's current medications.  Assessment and plan: Principal Problem: *Chest pain: -Cardiac enzymes negative x 3, EKG SR T inversion V1-V2. Continue aspirin will consult cards for possible cath. He was suppose to follow up with them on 05/02/2011. -On ASA, betablocker and stains.  COPD (chronic obstructive pulmonary disease) -Stable.  Alcohol intoxication -ETOH level 139 no sign, thiamine and folate. -monitor with CIWA.  Hypertension Stable.  History of CVA (cerebrovascular accident) Asa  Hypokalemia: Resolved.       LOS: 1 day  Marinda Elk [name] Pager: [phone] [facility] 04/30/2011, 1:03 PM

## 2011-04-30 NOTE — ED Notes (Signed)
Admitting MD at [facility], KCl given PO, family at [facility] x2.

## 2011-05-01 DIAGNOSIS — R079 Chest pain, unspecified: Secondary | ICD-10-CM

## 2011-05-01 MED ORDER — ASPIRIN 81 MG PO CHEW
324.0000 mg | CHEWABLE_TABLET | ORAL | Status: AC
  Administered 2011-05-02: 324 mg via ORAL
  Filled 2011-05-01: qty 4

## 2011-05-01 MED ORDER — SODIUM CHLORIDE 0.9 % IJ SOLN
3.0000 mL | Freq: Two times a day (BID) | INTRAMUSCULAR | Status: DC
  Administered 2011-05-01: 3 mL via INTRAVENOUS

## 2011-05-01 MED ORDER — SODIUM CHLORIDE 0.9 % IV SOLN: 250.0000 mL | INTRAVENOUS | Status: DC | PRN

## 2011-05-01 MED ORDER — SODIUM CHLORIDE 0.9 % IV SOLN
1.0000 mL/kg/h | INTRAVENOUS | Status: DC
  Administered 2011-05-02: 1 mL/kg/h via INTRAVENOUS

## 2011-05-01 MED ORDER — SODIUM CHLORIDE 0.9 % IJ SOLN: 3.0000 mL | INTRAMUSCULAR | Status: DC | PRN

## 2011-05-01 NOTE — Progress Notes (Signed)
SUBJECTIVE: The patient is doing well today.  At this time, he denies chest pain, shortness of breath, or any new concerns.     Marland Kitchen amLODipine  10 mg Oral Daily  . aspirin  325 mg Oral Daily  . atorvastatin  40 mg Oral q1800  . cloNIDine  0.3 mg Oral BID  . enoxaparin  40 mg Subcutaneous Q24H  . folic acid  1 mg Oral Daily  . gabapentin  800 mg Oral TID  . hydrochlorothiazide  25 mg Oral Daily  . lisinopril  20 mg Oral BID  . metoprolol succinate  100 mg Oral Daily  . nicotine  14 mg Transdermal Daily  . nortriptyline  25 mg Oral QHS  . sodium chloride  3 mL Intravenous Q12H  . thiamine  100 mg Oral Daily      . 0.9 % NaCl with KCl 20 mEq / L 75 mL/hr at 04/30/11 1829    OBJECTIVE: Physical Exam: Filed Vitals:   04/30/11 1357 04/30/11 2214 05/01/11 0251 05/01/11 0544  BP: 128/78 128/69 120/64 133/76  Pulse: 63 67 60 58  Temp: 98.6 F (37 C) 98.2 F (36.8 C) 98.4 F (36.9 C) 98.8 F (37.1 C)  TempSrc: Oral Oral Oral Oral  Resp: 18 16 18 18   Height:      Weight:    204 lb 5.9 oz (92.7 kg)  SpO2: 96% 93% 94% 97%    Intake/Output Summary (Last 24 hours) at 05/01/11 0901 Last data filed at 05/01/11 0600  Gross per 24 hour  Intake 2962.5 ml  Output      0 ml  Net 2962.5 ml    GEN- The patient is well appearing, alert and oriented x 3 today.   Head- normocephalic, atraumatic Eyes-  Sclera clear, conjunctiva pink Ears- hearing intact Oropharynx- clear Neck- supple, no JVP Lymph- no cervical lymphadenopathy Lungs- Clear to ausculation bilaterally, normal work of breathing Heart- Regular rate and rhythm, no murmurs, rubs or gallops, PMI not laterally displaced GI- soft, NT, ND, + BS Extremities- no clubbing, cyanosis, or edema  LABS: Basic Metabolic Panel:  Basename 04/30/11 0401 04/29/11 2229  NA 138 136  K 3.7 2.7*  CL 100 96  CO2 23 20  GLUCOSE 104* 161*  BUN 10 12  CREATININE 0.89 1.14  CALCIUM 8.7 9.0  MG 1.8 --  PHOS -- --   Liver Function  Tests: No results found for this basename: AST:2,ALT:2,ALKPHOS:2,BILITOT:2,PROT:2,ALBUMIN:2 in the last 72 hours No results found for this basename: LIPASE:2,AMYLASE:2 in the last 72 hours CBC:  Basename 04/30/11 0401 04/29/11 2229  WBC 9.4 11.3*  NEUTROABS -- --  HGB 15.8 16.9  HCT 44.3 47.3  MCV 85.4 86.2  PLT 194 204   Cardiac Enzymes:  Basename 04/30/11 1525 04/30/11 0847 04/30/11 0401  CKTOTAL -- -- --  CKMB -- -- --  CKMBINDEX -- -- --  TROPONINI <0.30 <0.30 <0.30   Basename 04/30/11 0847  CHOL 103  HDL 32*  LDLCALC 56  TRIG 76  CHOLHDL 3.2  LDLDIRECT --    ASSESSMENT AND PLAN:  Principal Problem:  *Chest pain Active Problems:  COPD (chronic obstructive pulmonary disease)  Alcohol intoxication  Hypertension  History of CVA (cerebrovascular accident)  Hypokalemia   ASSESSMENT AND PLAN:  Chest Pain. Recurrent SSCP requiring hospitalization:   Per Dr Ricki Miller initial consult, we will proceed with diagnostic cath Monday. Risks, benefits, and alterntives to cath with possible PCI were discussed with patient and wife. Will need neuro  to weigh in on safety of anticoagulation given pontine hemorrage on 1/31  ASA for now uncless enzymes become positive or ECG changes   HTN: Improved continue current meds   CVA: as above  COPD: With vocal cord issues and pneumonectomy Limited pulmonary reserve Smoking cessation strongly advised today.  He is contemplating cessation.   Hillis Range, MD 05/01/2011 9:01 AM

## 2011-05-01 NOTE — Progress Notes (Signed)
Pt HR 40s and sustaining, pt is asymptomatic up in the chair. Notified Tereso Newcomer, PA no new orders received. Will continue to monitor.

## 2011-05-01 NOTE — Progress Notes (Signed)
Subjective:  No chest pain or SOB today.  Objective: Filed Vitals:   04/30/11 1357 04/30/11 2214 05/01/11 0251 05/01/11 0544  BP: 128/78 128/69 120/64 133/76  Pulse: 63 67 60 58  Temp: 98.6 F (37 C) 98.2 F (36.8 C) 98.4 F (36.9 C) 98.8 F (37.1 C)  TempSrc: Oral Oral Oral Oral  Resp: 18 16 18 18   Height:      Weight:    92.7 kg (204 lb 5.9 oz)  SpO2: 96% 93% 94% 97%   Weight change: -0.2 kg (-7.1 oz)  Intake/Output Summary (Last 24 hours) at 05/01/11 0906 Last data filed at 05/01/11 0900  Gross per 24 hour  Intake 3282.5 ml  Output      0 ml  Net 3282.5 ml    General: Alert, awake, oriented x3, in no acute distress.  HEENT: No bruits, no goiter.  Heart: Regular rate and rhythm, without murmurs, rubs, gallops.  Lungs: Good air movement CTA B/L.  Abdomen: Soft, nontender, nondistended, positive bowel sounds.  Neuro: Grossly intact, nonfocal.   Lab Results:  [facility] 04/30/11 0401 04/29/11 2229  NA 138 136  K 3.7 2.7*  CL 100 96  CO2 23 20  GLUCOSE 104* 161*  BUN 10 12  CREATININE 0.89 1.14  CALCIUM 8.7 9.0  MG 1.8 --  PHOS -- --   No results found for this basename: AST:2,ALT:2,ALKPHOS:2,BILITOT:2,PROT:2,ALBUMIN:2 in the last 72 hours No results found for this basename: LIPASE:2,AMYLASE:2 in the last 72 hours  Basename 04/30/11 0401 04/29/11 2229  WBC 9.4 11.3*  NEUTROABS -- --  HGB 15.8 16.9  HCT 44.3 47.3  MCV 85.4 86.2  PLT 194 204    Basename 04/30/11 1525 04/30/11 0847 04/30/11 0401  CKTOTAL -- -- --  CKMB -- -- --  CKMBINDEX -- -- --  TROPONINI <0.30 <0.30 <0.30   No components found with this basename: POCBNP:3 No results found for this basename: DDIMER:2 in the last 72 hours No results found for this basename: HGBA1C:2 in the last 72 hours  Basename 04/30/11 0847  CHOL 103  HDL 32*  LDLCALC 56  TRIG 76  CHOLHDL 3.2  LDLDIRECT --   No results found for this basename: TSH,T4TOTAL,FREET3,T3FREE,THYROIDAB in the last 72 hours No  results found for this basename: VITAMINB12:2,FOLATE:2,FERRITIN:2,TIBC:2,IRON:2,RETICCTPCT:2 in the last 72 hours  Micro Results: No results found for this or any previous visit (from the past 240 hour(s)).  Studies/Results: Dg Chest 2 View  04/29/2011  *RADIOLOGY REPORT*  Clinical Data: Chest pain.  CHEST - 2 VIEW  Comparison: 04/12/2011  Findings: Decreased lung volumes are seen with development of bibasilar atelectasis.  No evidence of pulmonary consolidation or pleural effusion.  Heart size is within normal limits.  Pulmonary emphysema again demonstrated.  Surgical clips again noted in the right hilum.  IMPRESSION:  1.  Decreased lung volumes with development of bibasilar atelectasis. 2.  Bullous emphysema.  Original Report Authenticated By: Danae Orleans, [name]   Ct Head Wo Contrast  04/30/2011  *RADIOLOGY REPORT*  Clinical Data: Follow-up prior stroke.  CT HEAD WITHOUT CONTRAST  Technique:  Contiguous axial images were obtained from the base of the skull through the vertex without contrast.  Comparison: CT head without contrast 04/12/2011.  MRI brain without contrast 02/18/2011.  Findings: The previously noted brain stem hemorrhage has resolved. Lacunar infarcts of the left basal ganglia are stable.  Mild subcortical white matter hypoattenuation is similar to the prior exam.  New acute cortical infarct, hemorrhage, or mass  lesion is evident. The ventricles are normal size.  No significant extra-axial fluid collection is present.  The paranasal sinuses and mastoid air cells are clear.  The osseous skull is intact.  IMPRESSION:  1.  The previously noted left brain stem hemorrhage has resolved. 2.  Stable remote lacunar infarcts of the left basal ganglia. 3.  Stable subcortical white matter disease.  Original Report Authenticated By: Jamesetta Orleans. MATTERN, [name]    Medications: I have reviewed the patient's current medications.  Assessment and plan: Principal Problem: *Chest pain: -Cards agree  with diagnostic cath. ? DES stent vs Bare metals stent.  -On ASA, betablocker and stains. -Neuro recommendation pending. -cards agreed to take over.  COPD (chronic obstructive pulmonary disease) -Stable.  Alcohol intoxication -thiamine and folate. -monitor with CIWA.  Hypertension Excellent controlled of his BP. Will continue tight controlled of BP 2/2 to his history of hemorraghic stroke.  History of CVA (Left brain hemorraghic stroke) Asa  Hypokalemia: Resolved.     LOS: 2 days   Marinda Elk [name] Pager: [phone] [facility] 05/01/2011, 9:06 AM

## 2011-05-01 NOTE — Consult Note (Signed)
Reason for Consult: "safety of using heparin during cardiac cath and anti-platelet agents down the line"  HPI: Jonathon [name] is an 61 y.o. Male who had a hemmorhagic stroke several months ago in a setting of elevated blood pressure. I was consulted by cardiology regarding the safety of heparin use during the cardiac cath procedure as patient had chest pains as well as anti-platelet use down the line.   Past Medical History  Diagnosis Date  . CHF (congestive heart failure)   . COPD (chronic obstructive pulmonary disease)   . Hypertension   . Smoker   . Stroke   . Chest pain   . Shortness of breath    Medications: I have reviewed the patient's current medications.  Past Surgical History  Procedure Date  . Hernia repair   . Lobectomy    Family History  Problem Relation Age of Onset  . Hypertension     Social History:  reports that he has been smoking Cigarettes.  He has a 45 pack-year smoking history. He has never used smokeless tobacco. He reports that he drinks alcohol. He reports that he does not use illicit drugs.  Allergies: No Known Allergies  ROS: as above  Blood pressure 133/76, pulse 58, temperature 98.8 F (37.1 C), temperature source Oral, resp. rate 18, height 5\' 10"  (1.778 m), weight 92.7 kg (204 lb 5.9 oz), SpO2 97.00%.  Neurological exam: AAO*3. No aphasia.  Was able to tell me months of the year forwards and backwards correctly, exhibiting good attention span. Recall was 3 of 3 after 5 minutes. Followed complex commands. Cranial nerves: EOMI, PERRL. Visual fields were full. Sensation to V1 through V3 areas of the face was intact and symmetric throughout. There was no facial asymmetry. Hearing to finger rub was equal and symmetrical bilaterally. Shoulder shrug was 5/5 and symmetric bilaterally. Head rotation was 5/5 bilaterally. There was no dysarthria or palatal deviation. Motor: strength was 5/5 and symmetric throughout. Sensory: was intact throughout to light  touch on right. Coordination: finger-to-nose were intact and symmetric bilaterally. Reflexes: were 2+ in upper extremities and 1+ at the knees and 1+ at the ankles. Plantar response was downgoing bilaterally. Gait: deferred.  Results for orders placed during the hospital encounter of 04/29/11 (from the past 48 hour(s))  CBC     Status: Abnormal   Collection Time   04/29/11 10:29 PM      Component Value Range Comment   WBC 11.3 (*) 4.0 - 10.5 (K/uL)    RBC 5.49  4.22 - 5.81 (MIL/uL)    Hemoglobin 16.9  13.0 - 17.0 (g/dL)    HCT 16.1  09.6 - 04.5 (%)    MCV 86.2  78.0 - 100.0 (fL)    MCH 30.8  26.0 - 34.0 (pg)    MCHC 35.7  30.0 - 36.0 (g/dL)    RDW 40.9  81.1 - 91.4 (%)    Platelets 204  150 - 400 (K/uL)   BASIC METABOLIC PANEL     Status: Abnormal   Collection Time   04/29/11 10:29 PM      Component Value Range Comment   Sodium 136  135 - 145 (mEq/L)    Potassium 2.7 (*) 3.5 - 5.1 (mEq/L)    Chloride 96  96 - 112 (mEq/L)    CO2 20  19 - 32 (mEq/L)    Glucose, Bld 161 (*) 70 - 99 (mg/dL)    BUN 12  6 - 23 (mg/dL)    Creatinine, Ser  1.14  0.50 - 1.35 (mg/dL)    Calcium 9.0  8.4 - 10.5 (mg/dL)    GFR calc non Af Amer 68 (*) >90 (mL/min)    GFR calc Af Amer 78 (*) >90 (mL/min)   TROPONIN I     Status: Normal   Collection Time   04/29/11 10:29 PM      Component Value Range Comment   Troponin I <0.30  <0.30 (ng/mL)   ETHANOL     Status: Abnormal   Collection Time   04/30/11  1:35 AM      Component Value Range Comment   Alcohol, Ethyl (B) 139 (*) 0 - 11 (mg/dL)   TROPONIN I     Status: Normal   Collection Time   04/30/11  4:01 AM      Component Value Range Comment   Troponin I <0.30  <0.30 (ng/mL)   CBC     Status: Normal   Collection Time   04/30/11  4:01 AM      Component Value Range Comment   WBC 9.4  4.0 - 10.5 (K/uL)    RBC 5.19  4.22 - 5.81 (MIL/uL)    Hemoglobin 15.8  13.0 - 17.0 (g/dL)    HCT 16.1  09.6 - 04.5 (%)    MCV 85.4  78.0 - 100.0 (fL)    MCH 30.4  26.0 -  34.0 (pg)    MCHC 35.7  30.0 - 36.0 (g/dL)    RDW 40.9  81.1 - 91.4 (%)    Platelets 194  150 - 400 (K/uL)   BASIC METABOLIC PANEL     Status: Abnormal   Collection Time   04/30/11  4:01 AM      Component Value Range Comment   Sodium 138  135 - 145 (mEq/L)    Potassium 3.7  3.5 - 5.1 (mEq/L) NO VISIBLE HEMOLYSIS   Chloride 100  96 - 112 (mEq/L)    CO2 23  19 - 32 (mEq/L)    Glucose, Bld 104 (*) 70 - 99 (mg/dL)    BUN 10  6 - 23 (mg/dL)    Creatinine, Ser 7.82  0.50 - 1.35 (mg/dL)    Calcium 8.7  8.4 - 10.5 (mg/dL)    GFR calc non Af Amer >90  >90 (mL/min)    GFR calc Af Amer >90  >90 (mL/min)   MAGNESIUM     Status: Normal   Collection Time   04/30/11  4:01 AM      Component Value Range Comment   Magnesium 1.8  1.5 - 2.5 (mg/dL)   LIPID PANEL     Status: Abnormal   Collection Time   04/30/11  8:47 AM      Component Value Range Comment   Cholesterol 103  0 - 200 (mg/dL)    Triglycerides 76  <956 (mg/dL)    HDL 32 (*) >21 (mg/dL)    Total CHOL/HDL Ratio 3.2      VLDL 15  0 - 40 (mg/dL)    LDL Cholesterol 56  0 - 99 (mg/dL)   TROPONIN I     Status: Normal   Collection Time   04/30/11  8:47 AM      Component Value Range Comment   Troponin I <0.30  <0.30 (ng/mL)   TROPONIN I     Status: Normal   Collection Time   04/30/11  3:25 PM      Component Value Range Comment   Troponin I <0.30  <0.30 (ng/mL)  Dg Chest 2 View  04/29/2011  *RADIOLOGY REPORT*  Clinical Data: Chest pain.  CHEST - 2 VIEW  Comparison: 04/12/2011  Findings: Decreased lung volumes are seen with development of bibasilar atelectasis.  No evidence of pulmonary consolidation or pleural effusion.  Heart size is within normal limits.  Pulmonary emphysema again demonstrated.  Surgical clips again noted in the right hilum.  IMPRESSION:  1.  Decreased lung volumes with development of bibasilar atelectasis. 2.  Bullous emphysema.  Original Report Authenticated By: Danae Orleans, [name]   Ct Head Wo Contrast  04/30/2011   *RADIOLOGY REPORT*  Clinical Data: Follow-up prior stroke.  CT HEAD WITHOUT CONTRAST  Technique:  Contiguous axial images were obtained from the base of the skull through the vertex without contrast.  Comparison: CT head without contrast 04/12/2011.  MRI brain without contrast 02/18/2011.  Findings: The previously noted brain stem hemorrhage has resolved. Lacunar infarcts of the left basal ganglia are stable.  Mild subcortical white matter hypoattenuation is similar to the prior exam.  New acute cortical infarct, hemorrhage, or mass lesion is evident. The ventricles are normal size.  No significant extra-axial fluid collection is present.  The paranasal sinuses and mastoid air cells are clear.  The osseous skull is intact.  IMPRESSION:  1.  The previously noted left brain stem hemorrhage has resolved. 2.  Stable remote lacunar infarcts of the left basal ganglia. 3.  Stable subcortical white matter disease.  Original Report Authenticated By: Jamesetta Orleans. MATTERN, [name]   Assessment/Plan: 61 years old man with a recent left pontine hemorrhage in a setting of elevated blood pressure in the end of March - patient presents with chest pains and Cardiology asked me to comment regarding the safety of heparin use for the cardiac cath and regarding further anti-platelet use down the line.  1) In terms of bleeding risk, given his history - the patient is always at a higher risk than general population. The risk of bleeding can be reduced by making sure that he is normotensive for the procedure. Also, long term anticoagulation with heparin and warfarin should be avoided, but given the risk of serious cardiac complications, short-term use for the procedure is probably ok. Long-term, it would be best if he is not on ASA and Plavix for a long time, but if the drug-eluting stent is much more beneficial for cardiac purposes than a metallic stent, then it is likely best to go with the stent.  2) Call with questions  Kynsli Haapala,  Clarke Peretz 05/01/2011, 9:12 AM

## 2011-05-02 ENCOUNTER — Encounter (HOSPITAL_COMMUNITY)
Admission: EM | Disposition: A | Discharge status: Home / Self Care | Payer: Self-pay | Source: Home / Self Care | Attending: Cardiovascular Disease

## 2011-05-02 ENCOUNTER — Encounter: Payer: Self-pay | Admitting: Physician Assistant

## 2011-05-02 DIAGNOSIS — I2510 Atherosclerotic heart disease of native coronary artery without angina pectoris: Secondary | ICD-10-CM

## 2011-05-02 HISTORY — PX: LEFT HEART CATHETERIZATION WITH CORONARY ANGIOGRAM: SHX5451

## 2011-05-02 LAB — CBC
HCT: 44.7 % (ref 39.0–52.0)
Hemoglobin: 15.2 g/dL (ref 13.0–17.0)
MCH: 29.5 pg (ref 26.0–34.0)
MCHC: 34 g/dL (ref 30.0–36.0)
MCV: 86.8 fL (ref 78.0–100.0)
Platelets: 185 10*3/uL (ref 150–400)
RBC: 5.15 MIL/uL (ref 4.22–5.81)
RDW: 13.3 % (ref 11.5–15.5)
WBC: 7.8 10*3/uL (ref 4.0–10.5)

## 2011-05-02 LAB — BASIC METABOLIC PANEL
BUN: 15 mg/dL (ref 6–23)
CO2: 30 mEq/L (ref 19–32)
Calcium: 9.1 mg/dL (ref 8.4–10.5)
Chloride: 102 mEq/L (ref 96–112)
Creatinine, Ser: 1.04 mg/dL (ref 0.50–1.35)
GFR calc Af Amer: 88 mL/min — ABNORMAL LOW (ref >90)
GFR calc non Af Amer: 76 mL/min — ABNORMAL LOW (ref >90)
Glucose, Bld: 117 mg/dL — ABNORMAL HIGH (ref 70–99)
Potassium: 3.6 mEq/L (ref 3.5–5.1)
Sodium: 140 mEq/L (ref 135–145)

## 2011-05-02 LAB — PROTIME-INR
INR: 0.97 (ref 0.00–1.49)
Prothrombin Time: 13.1 seconds (ref 11.6–15.2)

## 2011-05-02 SURGERY — LEFT HEART CATHETERIZATION WITH CORONARY ANGIOGRAM
Anesthesia: Moderate Sedation

## 2011-05-02 MED ORDER — ONDANSETRON HCL 4 MG/2ML IJ SOLN: 4.0000 mg | Freq: Four times a day (QID) | INTRAMUSCULAR | Status: DC | PRN

## 2011-05-02 MED ORDER — ASPIRIN EC 325 MG PO TBEC
325.0000 mg | DELAYED_RELEASE_TABLET | Freq: Every day | ORAL | Status: DC
  Filled 2011-05-02 (×2): qty 1

## 2011-05-02 MED ORDER — OXYCODONE-ACETAMINOPHEN 5-325 MG PO TABS: 1.0000 | ORAL_TABLET | ORAL | Status: DC | PRN

## 2011-05-02 MED ORDER — ACETAMINOPHEN 325 MG PO TABS: 650.0000 mg | ORAL_TABLET | ORAL | Status: DC | PRN

## 2011-05-02 MED ORDER — NITROGLYCERIN 0.2 MG/ML ON CALL CATH LAB
INTRAVENOUS | Status: AC
  Filled 2011-05-02: qty 1

## 2011-05-02 MED ORDER — SODIUM CHLORIDE 0.9 % IJ SOLN: 3.0000 mL | INTRAMUSCULAR | Status: DC | PRN

## 2011-05-02 MED ORDER — DIAZEPAM 2 MG PO TABS: 2.0000 mg | ORAL_TABLET | ORAL | Status: DC | PRN

## 2011-05-02 MED ORDER — SODIUM CHLORIDE 0.9 % IJ SOLN
3.0000 mL | Freq: Two times a day (BID) | INTRAMUSCULAR | Status: DC
  Administered 2011-05-03: 3 mL via INTRAVENOUS

## 2011-05-02 MED ORDER — SODIUM CHLORIDE 0.9 % IV SOLN
1.0000 mL/kg/h | INTRAVENOUS | Status: DC
  Administered 2011-05-02 (×2): 1 mL/kg/h via INTRAVENOUS

## 2011-05-02 MED ORDER — FENTANYL CITRATE 0.05 MG/ML IJ SOLN
INTRAMUSCULAR | Status: AC
  Filled 2011-05-02: qty 2

## 2011-05-02 MED ORDER — HEPARIN (PORCINE) IN NACL 2-0.9 UNIT/ML-% IJ SOLN
INTRAMUSCULAR | Status: AC
  Filled 2011-05-02: qty 2000

## 2011-05-02 MED ORDER — MIDAZOLAM HCL 2 MG/2ML IJ SOLN
INTRAMUSCULAR | Status: AC
  Filled 2011-05-02: qty 2

## 2011-05-02 MED ORDER — SODIUM CHLORIDE 0.9 % IV SOLN: 250.0000 mL | INTRAVENOUS | Status: DC

## 2011-05-02 MED ORDER — LIDOCAINE HCL (PF) 1 % IJ SOLN
INTRAMUSCULAR | Status: AC
  Filled 2011-05-02: qty 30

## 2011-05-02 NOTE — CV Procedure (Signed)
      Catheterization   Indication: Chest Pain  Procedure: After informed consent and clinical "time out" the right groin was prepped and draped in a sterile fashion.  A 5Fr sheath was placed in the right femoral artery using seldinger technique and local lidocaine.  Standard JL4, JR4 and angled pigtail catheters were used to engage the coronary arteries.  Coronary arteries were visualized in orthogonal views using caudal and cranial angulation.  RAO ventriculography was done using 29* cc of contrast.    Medications:   Versed: 2 mg's  Fentanyl: 25 ug's  Coronary Arteries: Right dominant with no anomalies  LM: Short segment normal  LAD: 40% mid at take off of large branching D1    IM: small and normal  D1: Large branching artery 30% mid    Circumflex: Normal   OM1: 30% mid vessel lesion    RCA: Normal and dominant   PDA: Normal  PLA: Normal  Ventriculography: EF: 60 %, no RWMA's  Hemodynamics:  Aortic Pressure: 159 79 mmHg  LV Pressure: 142 5  mmHg  Impression:  Mild nonobstructive disease in LAD and OM and D1.  Noncardiac chest pain.  Ok to D/C in am  [name] 05/02/2011 2:56 PM

## 2011-05-02 NOTE — Progress Notes (Signed)
Patient ID: Jonathon [name], male   DOB: [birth year], 61 y.o.   MRN: [id number]    @ Subjective:  Denies SSCP, palpitations or Dyspnea   Objective:  Filed Vitals:   05/01/11 0802 05/01/11 1438 05/01/11 2156 05/02/11 0517  BP: 129/88 125/76 134/85 141/87  Pulse: 54 52 47 46  Temp: 97.8 F (36.6 C) 97.6 F (36.4 C) 98 F (36.7 C) 98.2 F (36.8 C)  TempSrc: Oral Oral Oral Oral  Resp: 18 18 18 18   Height:      Weight:    203 lb 4.8 oz (92.216 kg)  SpO2: 96% 96% 97% 96%    Intake/Output from previous day:  Intake/Output Summary (Last 24 hours) at 05/02/11 0957 Last data filed at 05/02/11 0700  Gross per 24 hour  Intake 1158.7 ml  Output      0 ml  Net 1158.7 ml    Physical Exam: Affect appropriate Healthy:  appears stated age HEENT: Hoarse voice Neck supple with no adenopathy JVP normal no bruits no thyromegaly Lungs S/P right thoracotomy with COPD no active wheezing Heart:  S1/S2 no murmur, no rub, gallop or click PMI normal Abdomen: benighn, BS positve, no tenderness, no AAA no bruit.  No HSM or HJR Distal pulses intact with no bruits No edema Neuro non-focal Skin warm and dry No muscular weakness   Lab Results: Basic Metabolic Panel:  Basename 05/02/11 0510 04/30/11 0401  NA 140 138  K 3.6 3.7  CL 102 100  CO2 30 23  GLUCOSE 117* 104*  BUN 15 10  CREATININE 1.04 0.89  CALCIUM 9.1 8.7  MG -- 1.8  PHOS -- --   Liver Function Tests: No results found for this basename: AST:2,ALT:2,ALKPHOS:2,BILITOT:2,PROT:2,ALBUMIN:2 in the last 72 hours No results found for this basename: LIPASE:2,AMYLASE:2 in the last 72 hours CBC:  Basename 05/02/11 0510 04/30/11 0401  WBC 7.8 9.4  NEUTROABS -- --  HGB 15.2 15.8  HCT 44.7 44.3  MCV 86.8 85.4  PLT 185 194   Cardiac Enzymes:  Basename 04/30/11 1525 04/30/11 0847 04/30/11 0401  CKTOTAL -- -- --  CKMB -- -- --  CKMBINDEX -- -- --  TROPONINI <0.30 <0.30 <0.30    Imaging: Ct Head Wo  Contrast  04/30/2011  *RADIOLOGY REPORT*  Clinical Data: Follow-up prior stroke.  CT HEAD WITHOUT CONTRAST  Technique:  Contiguous axial images were obtained from the base of the skull through the vertex without contrast.  Comparison: CT head without contrast 04/12/2011.  MRI brain without contrast 02/18/2011.  Findings: The previously noted brain stem hemorrhage has resolved. Lacunar infarcts of the left basal ganglia are stable.  Mild subcortical white matter hypoattenuation is similar to the prior exam.  New acute cortical infarct, hemorrhage, or mass lesion is evident. The ventricles are normal size.  No significant extra-axial fluid collection is present.  The paranasal sinuses and mastoid air cells are clear.  The osseous skull is intact.  IMPRESSION:  1.  The previously noted left brain stem hemorrhage has resolved. 2.  Stable remote lacunar infarcts of the left basal ganglia. 3.  Stable subcortical white matter disease.  Original Report Authenticated By: Jamesetta Orleans. MATTERN, [name]    Cardiac Studies:  ECG:  NSR labile inferior T waves   Telemetry:  NSR no arrhythmia bradycardic in 40's at times  Echo:   Medications:      . amLODipine  10 mg Oral Daily  . aspirin  324 mg Oral Pre-Cath  . aspirin  325  mg Oral Daily  . atorvastatin  40 mg Oral q1800  . cloNIDine  0.3 mg Oral BID  . enoxaparin  40 mg Subcutaneous Q24H  . folic acid  1 mg Oral Daily  . gabapentin  800 mg Oral TID  . hydrochlorothiazide  25 mg Oral Daily  . lisinopril  20 mg Oral BID  . nicotine  14 mg Transdermal Daily  . nortriptyline  25 mg Oral QHS  . sodium chloride  3 mL Intravenous Q12H  . sodium chloride  3 mL Intravenous Q12H  . thiamine  100 mg Oral Daily  . DISCONTD: metoprolol succinate  100 mg Oral Daily        . sodium chloride 1 mL/kg/hr (05/02/11 0400)  . 0.9 % NaCl with KCl 20 mEq / L 75 mL/hr at 04/30/11 1829    Assessment/Plan:  HTN:  Continue current meds.   Smoking:  COPD with previous  surgery for BLEBS.  Smoking cessation has seen patient TIA:  Aprreciate neuro note. Agree it should be safe to anticoagulate and use ASA/Plavix if stent needed Chest Pain:  Recurrent.  Cath latter today [name] 05/02/2011, 9:57 AM

## 2011-05-02 NOTE — Interval H&P Note (Signed)
History and Physical Interval Note:  05/02/2011 2:55 PM  Jonathon [name]  has presented today for surgery, with the diagnosis of Chest Pain  The various methods of treatment have been discussed with the patient and family. After consideration of risks, benefits and other options for treatment, the patient has consented to  Procedure(s) (LRB): LEFT HEART CATHETERIZATION WITH CORONARY ANGIOGRAM ([name]) as a surgical intervention .  The patients' history has been reviewed, patient examined, no change in status, stable for surgery.  I have reviewed the patients' chart and labs.  Questions were answered to the patient's satisfaction.     [name]

## 2011-05-02 NOTE — H&P (View-Only) (Signed)
CARDIOLOGY CONSULT NOTE    Patient ID: Jonathon [name] MRN: [id number] DOB/AGE: 01/27/1950 61 y.o.  Admit date: 04/12/2011 Referring Physician: Robb Matar Primary Physician: Aura Dials, MD, MD Primary Cardiologist:  New Reason for Consultation: Chest Pain  Active Problems:  HTN (hypertension)  Chest pain at rest   HPI:  61 year old [name] gentleman who presents 3/26 with chest pain onset since this morning. The patient describes the pain as being a heaviness on his chest in the substernal region nonradiating. He rates the pain as a 6/10. He did not take anything for the pain and is unable to identify any provocative or palliative features. 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 The patient also has some vocal cord paralysis from the past which renders him hoarse when he speaking. The patient however has had no change since his stroke and he has had no difficulty with even drinking.  The patient denies any fever, chills, nausea, vomiting, diarrhea, dizziness, near syncope, cough. The patient is a pack per day tobacco user for greater than 45 years. He has had a previous right thoracotomy for blebs and has some chronic exertional dyspnea.  Not likely to do good on ETT due to dyspnea and right sided weakness  Enzymes negative and had normal EF by echo 1/13 when he had his CVA   @ROS @ All other systems reviewed and negative except as noted above  Past Medical History  Diagnosis Date  . CHF (congestive heart failure)   . COPD (chronic obstructive pulmonary disease)   . Hypertension   . Smoker   . Stroke   . Chest pain   . Shortness of breath     History reviewed. No pertinent family history.  History   Social History  . Marital Status: Married    Spouse Name: [name]    Number of Children: [name]  . Years of Education: [name]    Occupational History  . Not on file.   Social History Main Topics  . Smoking status: Current Everyday Smoker -- 1.0 packs/day for 45 years    Types: Cigarettes  . Smokeless tobacco: Never Used  . Alcohol Use: No  . Drug Use: No  . Sexually Active: Yes   Other Topics Concern  . Not on file   Social History Narrative  . No narrative on file    Past Surgical History  Procedure Date  . Hernia repair   . Lobectomy         . amLODipine  5 mg Oral Daily  . aspirin EC  325 mg Oral Daily  . aspirin  325 mg Oral STAT  . docusate sodium  100 mg Oral BID  . enoxaparin  40 mg Subcutaneous Q24H  . gabapentin  800 mg Oral TID  . hydrochlorothiazide  25 mg Oral Daily  . lisinopril  20 mg Oral BID  . metoprolol tartrate  50 mg Oral BID  . nortriptyline  25 mg Oral QHS  . potassium chloride  40 mEq Oral BID  . senna-docusate  1 tablet Oral BID  . sodium chloride  3 mL Intravenous Q12H  . sodium chloride  3 mL Intravenous Q12H      . DISCONTD: sodium chloride      Physical Exam:   Affect appropriate Chronically ill [name] male with hoarse voice HEENT: normal Neck supple with no adenopathy JVP normal  no bruits no thyromegaly Lungs poor breath sounds no wheezing and good diaphragmatic motion Right Thoracotomy Heart:  S1/S2 no murmur, no rub, gallop or click PMI normal Abdomen: benighn, BS positve, no tenderness, no AAA no bruit.  No HSM or HJR Distal pulses intact with no bruits No edema Neuro some residual RUE/RLE weakness Skin warm and dry No muscular atrophy  Labs:   Lab Results  Component Value Date   WBC 9.6 04/12/2011   HGB 15.7 04/12/2011   HCT 44.4 04/12/2011   MCV 87.2 04/12/2011   PLT 202 04/12/2011    Lab 04/13/11 0159  NA 137  K 3.3*  CL 100  CO2 29  BUN 14  CREATININE 1.03  CALCIUM 8.9  PROT --  BILITOT --  ALKPHOS --  ALT --  AST --  GLUCOSE 104*   Lab Results  Component Value Date   CKTOTAL 77 04/13/2011   CKMB 2.1 04/13/2011    TROPONINI <0.30 04/13/2011    Lab Results  Component Value Date   CHOL 133 02/18/2011   CHOL  Value: 123        ATP III CLASSIFICATION:  <200     mg/dL   Desirable  161-096  mg/dL   Borderline High  >=045    mg/dL   High        4/0/9811   Lab Results  Component Value Date   HDL 50 02/18/2011   HDL 45 09/25/2009   Lab Results  Component Value Date   LDLCALC 72 02/18/2011   LDLCALC  Value: 60        Total Cholesterol/HDL:CHD Risk Coronary Heart Disease Risk Table                     Men   Women  1/2 Average Risk   3.4   3.3  Average Risk       5.0   4.4  2 X Average Risk   9.6   7.1  3 X Average Risk  23.4   11.0        Use the calculated Patient Ratio above and the CHD Risk Table to determine the patient's CHD Risk.        ATP III CLASSIFICATION (LDL):  <100     mg/dL   Optimal  914-782  mg/dL   Near or Above                    Optimal  130-159  mg/dL   Borderline  956-213  mg/dL   High  >086     mg/dL   Very High 05/24/8467   Lab Results  Component Value Date   TRIG 56 02/18/2011   TRIG 91 09/25/2009   Lab Results  Component Value Date   CHOLHDL 2.7 02/18/2011   CHOLHDL 2.7 09/25/2009   No results found for this basename: LDLDIRECT      Radiology: Dg Chest 2 View  04/12/2011  *RADIOLOGY REPORT*  Clinical Data: Shortness of breath, chest pain.  CHEST - 2 VIEW  Comparison: 02/17/2011  Findings: There is hyperinflation of the lungs compatible with COPD.  Heart is normal size.  Densities in the lung bases likely reflects scarring.  No effusions.  No acute bony abnormality. Postoperative changes in the right hilum.  IMPRESSION: COPD/chronic changes.  No acute findings.  Original Report Authenticated By: Cyndie Chime, [name]   Ct Head Wo Contrast  04/12/2011  *RADIOLOGY REPORT*  Clinical Data: Headache.  CT HEAD WITHOUT CONTRAST  Technique:  Contiguous axial images were obtained from the base of the skull through the vertex without contrast.  Comparison: 02/21/2011  Findings: Previously seen left pontine  hemorrhage is no longer visualized. There is atrophy and chronic small vessel disease changes. No acute intracranial abnormality.  Specifically, no hemorrhage, hydrocephalus, mass lesion, acute infarction, or significant intracranial injury.  No acute calvarial abnormality. Visualized paranasal sinuses and mastoids clear.  Orbital soft tissues unremarkable.  IMPRESSION: No acute intracranial abnormality.  Atrophy, chronic microvascular disease.  Original Report Authenticated By: Cyndie Chime, [name]    EKG:   NSR inferolateral T wave changes new since January.  No acute ST elevation.     ASSESSMENT AND PLAN:  Chest Pain.  Limited episode with negative enzymes.  Hesitant for invasive evaluation given recent CVA and likely inability to anticoagulate.  Clinical presentation low risk but ECG changes a little worrisome in smoker Agree with risk stratification with myovue.  Will use Lexiscan given lung disease and right sided weakness HTN:  Continue diuretic and norvasc.  Well controlled Pulm:  COPD with ongoing smoking and previous thoracotomy.  F/U PVT.  Smoking cessation consult although he is not motivated to quit  Signed: [name] 04/13/2011, 3:14 PM

## 2011-05-03 DIAGNOSIS — R079 Chest pain, unspecified: Secondary | ICD-10-CM

## 2011-05-03 LAB — CBC
HCT: 46.4 % (ref 39.0–52.0)
Hemoglobin: 16 g/dL (ref 13.0–17.0)
MCH: 29.9 pg (ref 26.0–34.0)
MCHC: 34.5 g/dL (ref 30.0–36.0)
MCV: 86.7 fL (ref 78.0–100.0)
Platelets: 200 10*3/uL (ref 150–400)
RBC: 5.35 MIL/uL (ref 4.22–5.81)
RDW: 13.3 % (ref 11.5–15.5)
WBC: 8.1 10*3/uL (ref 4.0–10.5)

## 2011-05-03 LAB — BASIC METABOLIC PANEL
BUN: 12 mg/dL (ref 6–23)
CO2: 30 mEq/L (ref 19–32)
Calcium: 9.1 mg/dL (ref 8.4–10.5)
Chloride: 97 mEq/L (ref 96–112)
Creatinine, Ser: 1 mg/dL (ref 0.50–1.35)
GFR calc Af Amer: >90 mL/min (ref >90)
GFR calc non Af Amer: 79 mL/min — ABNORMAL LOW (ref >90)
Glucose, Bld: 98 mg/dL (ref 70–99)
Potassium: 3.5 mEq/L (ref 3.5–5.1)
Sodium: 136 mEq/L (ref 135–145)

## 2011-05-03 MED ORDER — NICOTINE 14 MG/24HR TD PT24: 1.0000 | MEDICATED_PATCH | Freq: Every day | TRANSDERMAL | Status: DC

## 2011-05-03 NOTE — Discharge Summary (Signed)
CARDIOLOGY DISCHARGE SUMMARY   Patient ID: Jonathon [name] MRN: [id number] DOB/AGE: 06/08/1950 61 y.o.  Admit date: 04/29/2011 Discharge date: 05/03/2011  Primary Discharge Diagnosis:  Chest Pain Secondary Discharge Diagnosis:  Patient Active Problem List  Diagnoses  . Pontine hemorrhage  . Chest pain  . COPD (chronic obstructive pulmonary disease)  . Alcohol intoxication  . Hypertension  . History of Left Stroke, hemorrhagic  . Hypokalemia    Consults: Internal medicine, neurology  Procedures: Cardiac catheterization, coronary arteriogram, left ventriculogram, CT of the head  Hospital Course: Jonathon [name] is a 61 year old male with no previous history of coronary artery disease. He had prolonged chest pain and was initially seen by Dr. Eden Emms in March 2013. He had a recent CVA and risk stratification with a Myoview was recommended. The Myoview showed a small infarct but no ischemia. He was scheduled for an outpatient catheterization.   The patient developed chest pain on 04/30/2011 at rest. He had not been taking his medications for the 2 days prior to admission and had been drinking steadily. His EtOH level was elevated. His ECG was not acute and initial enzymes were negative. He was hypokalemic with a potassium level of 2.7. He was admitted for further evaluation and treatment.  His cardiac enzymes remained negative for MI. A lipid profile showed a slightly low HDL but his LDL was under good control. He was placed on when necessary medications for alcohol withdrawal as well as thiamine and folate acid.  He remained stable and on 05/02/2011 he was taken to the cath lab. The results are listed below. Dr. Eden Emms reviewed the films and felt that he had nonobstructive disease and no further inpatient workup was indicated. On 05/03/2011, Jonathon [name] is ambulating without chest pain or shortness of breath and considered stable for discharge, to followup as an  outpatient.   Labs:   Lab Results  Component Value Date   WBC 8.1 05/03/2011   HGB 16.0 05/03/2011   HCT 46.4 05/03/2011   MCV 86.7 05/03/2011   PLT 200 05/03/2011    Lab 05/03/11 0528  NA 136  K 3.5  CL 97  CO2 30  BUN 12  CREATININE 1.00  CALCIUM 9.1  PROT --  BILITOT --  ALKPHOS --  ALT --  AST --  GLUCOSE 98    Basename 04/30/11 1525  CKTOTAL --  CKMB --  CKMBINDEX --  TROPONINI <0.30   Lipid Panel     Component Value Date/Time   CHOL 103 04/30/2011 0847   TRIG 76 04/30/2011 0847   HDL 32* 04/30/2011 0847   CHOLHDL 3.2 04/30/2011 0847   VLDL 15 04/30/2011 0847   LDLCALC 56 04/30/2011 0847    Pro B Natriuretic peptide (BNP)  Date/Time Value Range Status  04/12/2011 10:08 AM 96.5  0-125 (pg/mL) Final  09/23/2009  7:46 PM 53.8  0.0-100.0 (pg/mL) Final    Basename 05/02/11 0510  INR 0.97   Ethyl Alcohol level: 139    Cardiac cath:  LM: Short segment normal  LAD: 40% mid at take off of large branching D1  IM: small and normal  D1: Large branching artery 30% mid  Circumflex: Normal  OM1: 30% mid vessel lesion  RCA: Normal and dominant  PDA: Normal  PLA: Normal  Ventriculography: EF: 60 %, no RWMA's   Radiology:  Dg Chest 2 View 04/29/2011  *RADIOLOGY REPORT*  Clinical Data: Chest pain.  CHEST - 2 VIEW  Comparison: 04/12/2011  Findings: Decreased lung volumes  are seen with development of bibasilar atelectasis.  No evidence of pulmonary consolidation or pleural effusion.  Heart size is within normal limits.  Pulmonary emphysema again demonstrated.  Surgical clips again noted in the right hilum.  IMPRESSION:  1.  Decreased lung volumes with development of bibasilar atelectasis. 2.  Bullous emphysema.  Original Report Authenticated By: Danae Orleans, [name]   Ct Head Wo Contrast 04/30/2011  *RADIOLOGY REPORT*  Clinical Data: Follow-up prior stroke.  CT HEAD WITHOUT CONTRAST  Technique:  Contiguous axial images were obtained from the base of the skull through the  vertex without contrast.  Comparison: CT head without contrast 04/12/2011.  MRI brain without contrast 02/18/2011.  Findings: The previously noted brain stem hemorrhage has resolved. Lacunar infarcts of the left basal ganglia are stable.  Mild subcortical white matter hypoattenuation is similar to the prior exam.  New acute cortical infarct, hemorrhage, or mass lesion is evident. The ventricles are normal size.  No significant extra-axial fluid collection is present.  The paranasal sinuses and mastoid air cells are clear.  The osseous skull is intact.  IMPRESSION:  1.  The previously noted left brain stem hemorrhage has resolved. 2.  Stable remote lacunar infarcts of the left basal ganglia. 3.  Stable subcortical white matter disease.  Original Report Authenticated By: Jamesetta Orleans. MATTERN, [name]    EKG: 29-Apr-2011 22:36:40 SINUS RHYTHM ~ normal P axis, V-rate 50- 99 PROBABLE LEFT ATRIAL ABNORMALITY ~ P >9mS, <-0.56mV V1 LEFT AXIS DEVIATION ~ QRS axis (-30,-90) PROBABLE ANTEROSEPTAL INFARCT, AGE INDETERM ~ Q >85mS, T neg, V1-V3 Vent. rate 85 BPM PR interval 168 ms QRS duration 104 ms QT/QTc 420/499 ms P-R-T axes 69 -32 82  FOLLOW UP PLANS AND APPOINTMENTS Discharge Orders    Future Appointments: Provider: Department: Dept Phone: Center:   05/09/2011 3:15 PM Wendall Stade, MD [facility] [phone] [facility]     No Known Allergies Medication List  As of 05/03/2011 12:38 PM   TAKE these medications         amLODipine 10 MG tablet   Commonly known as: NORVASC   Take 10 mg by mouth daily.      aspirin EC 81 MG tablet   Take 81 mg by mouth daily.      cloNIDine 0.3 MG tablet   Commonly known as: CATAPRES   Take 0.3 mg by mouth 2 (two) times daily.      gabapentin 400 MG capsule   Commonly known as: NEURONTIN   Take 800 mg by mouth 3 (three) times daily.      hydrochlorothiazide 25 MG tablet   Commonly known as: HYDRODIURIL   Take 25 mg by mouth daily.       lisinopril 20 MG tablet   Commonly known as: PRINIVIL,ZESTRIL   Take 20 mg by mouth 2 (two) times daily.      metoprolol succinate 100 MG 24 hr tablet   Commonly known as: TOPROL-XL   Take 100 mg by mouth daily. Take with or immediately following a meal. Time taken unknown.      nicotine 14 mg/24hr patch   Commonly known as: NICODERM CQ - dosed in mg/24 hours   Place 1 patch onto the skin daily.      nitroGLYCERIN 0.4 MG SL tablet   Commonly known as: NITROSTAT   Place 1 tablet (0.4 mg total) under the tongue every 5 (five) minutes as needed. For chest pain      nortriptyline 25 MG capsule  Commonly known as: PAMELOR   Take 25 mg by mouth at bedtime.           Follow-up Information    Follow up with BOUSKA,DAVID E, MD. (As needed)       Follow up with [name], MD. (April 22nd at 3:15 pm)    Contact information:   [address] [place] [address] [phone]          BRING ALL MEDICATIONS WITH YOU TO FOLLOW UP APPOINTMENTS  Time spent with patient to include physician time: 41 min Signed: Theodore Demark 05/03/2011, 12:38 PM Co-Sign MD

## 2011-05-03 NOTE — Progress Notes (Signed)
Pt discharged home, given discharge instructions questions answered. D/c'd IV and Tele. Refused to use wheelchair. Wife escorting pt out.

## 2011-05-03 NOTE — Discharge Instructions (Signed)
NO HEAVY LIFTING OR SEXUAL ACTIVITY X 7 DAYS. NO DRIVING X 2 DAYS. NO SOAKING BATHS, HOT TUBS, POOLS, ETC., X 5 DAYS.  NO Alcohol NO Tobacco

## 2011-05-03 NOTE — Progress Notes (Signed)
Patient Name: Jonathon [name] Date of Encounter: 05/03/2011  Principal Problem:  *Chest pain Active Problems:  COPD (chronic obstructive pulmonary disease)  Alcohol intoxication  Hypertension  History of Left Stroke, hemorrhagic  Hypokalemia    SUBJECTIVE: No chest pain, no SOB. Wants to go home.   OBJECTIVE  Filed Vitals:   05/02/11 2000 05/02/11 2118 05/03/11 0554 05/03/11 1003  BP: 134/80 137/83 134/84 149/90  Pulse: 57 65 66 66  Temp:  98.1 F (36.7 C) 98.3 F (36.8 C)   TempSrc:  Oral Oral   Resp:  18 18   Height:      Weight:   203 lb 6.4 oz (92.262 kg)   SpO2:  95% 95%     Intake/Output Summary (Last 24 hours) at 05/03/11 1255 Last data filed at 05/03/11 1000  Gross per 24 hour  Intake   1610 ml  Output   2000 ml  Net   -390 ml   Weight change: 1.6 oz (0.045 kg)  Filed Weights   05/01/11 0544 05/02/11 0517 05/03/11 0554  Weight: 204 lb 5.9 oz (92.7 kg) 203 lb 4.8 oz (92.216 kg) 203 lb 6.4 oz (92.262 kg)     PHYSICAL EXAM  General: Well developed, well nourished, male in no acute distress. Head: Normocephalic, atraumatic.  Neck: Supple without bruits, JVD not elevated. Lungs:  Resp regular and unlabored, CTA bilaterally. Heart: RRR, S1, S2, no S3, S4, or murmurs. Abdomen: Soft, non-tender, non-distended, BS + x 4.  Extremities: No clubbing, cyanosis, no edema. Cath site without hematoma or bruit  Neuro: Alert and oriented X 3. Moves all extremities spontaneously. Psych: Normal affect.  LABS:  CBC: Basename 05/03/11 0528 05/02/11 0510  WBC 8.1 7.8  NEUTROABS -- --  HGB 16.0 15.2  HCT 46.4 44.7  MCV 86.7 86.8  PLT 200 185   INR: Basename 05/02/11 0510  INR 0.97   Basic Metabolic Panel: Basename 05/03/11 0528 05/02/11 0510  NA 136 140  K 3.5 3.6  CL 97 102  CO2 30 30  GLUCOSE 98 117*  BUN 12 15  CREATININE 1.00 1.04  CALCIUM 9.1 9.1  MG -- --  PHOS -- --   Cardiac Enzymes: Basename 04/30/11 1525  CKTOTAL --  CKMB --    CKMBINDEX --  TROPONINI <0.30    TELE:   SR     Radiology/Studies: Dg Chest 2 View 04/29/2011  *RADIOLOGY REPORT*  Clinical Data: Chest pain.  CHEST - 2 VIEW  Comparison: 04/12/2011  Findings: Decreased lung volumes are seen with development of bibasilar atelectasis.  No evidence of pulmonary consolidation or pleural effusion.  Heart size is within normal limits.  Pulmonary emphysema again demonstrated.  Surgical clips again noted in the right hilum.  IMPRESSION:  1.  Decreased lung volumes with development of bibasilar atelectasis. 2.  Bullous emphysema.  Original Report Authenticated By: Danae Orleans, [name]   Ct Head Wo Contrast 04/30/2011  *RADIOLOGY REPORT*  Clinical Data: Follow-up prior stroke.  CT HEAD WITHOUT CONTRAST  Technique:  Contiguous axial images were obtained from the base of the skull through the vertex without contrast.  Comparison: CT head without contrast 04/12/2011.  MRI brain without contrast 02/18/2011.  Findings: The previously noted brain stem hemorrhage has resolved. Lacunar infarcts of the left basal ganglia are stable.  Mild subcortical white matter hypoattenuation is similar to the prior exam.  New acute cortical infarct, hemorrhage, or mass lesion is evident. The ventricles are normal size.  No  significant extra-axial fluid collection is present.  The paranasal sinuses and mastoid air cells are clear.  The osseous skull is intact.  IMPRESSION:  1.  The previously noted left brain stem hemorrhage has resolved. 2.  Stable remote lacunar infarcts of the left basal ganglia. 3.  Stable subcortical white matter disease.  Original Report Authenticated By: Jamesetta Orleans. MATTERN, [name]     Current Medications:   . amLODipine  10 mg Oral Daily  . aspirin EC  325 mg Oral Daily  . aspirin  325 mg Oral Daily  . atorvastatin  40 mg Oral q1800  . cloNIDine  0.3 mg Oral BID  . enoxaparin  40 mg Subcutaneous Q24H  . fentaNYL      . folic acid  1 mg Oral Daily  . gabapentin  800  mg Oral TID  . heparin      . hydrochlorothiazide  25 mg Oral Daily  . lidocaine      . lisinopril  20 mg Oral BID  . midazolam      . nicotine  14 mg Transdermal Daily  . nitroGLYCERIN      . nortriptyline  25 mg Oral QHS  . sodium chloride  3 mL Intravenous Q12H  . sodium chloride  3 mL Intravenous Q12H  . thiamine  100 mg Oral Daily    ASSESSMENT AND PLAN: Principal Problem:  *Chest pain Active Problems:  COPD (chronic obstructive pulmonary disease)  Alcohol intoxication  Hypertension  History of Left Stroke, hemorrhagic  Hypokalemia  Plan: Pt chest pain has resolved and his electrolyte abnormalities have been supplemented. Dr Eden Emms has seen Jonathon [name] and he will be discharged home, to follow up as an outpatient.  Signed, Theodore Demark , PA-C 12:55 PM 05/03/2011  Patient examined.  S/P pneumonectomy cath site A with no hematoma. Cath with no critical CAD Ready for D/C  [name] 05/03/2011

## 2011-05-09 ENCOUNTER — Encounter: Payer: Self-pay | Admitting: Cardiovascular Disease

## 2011-05-12 ENCOUNTER — Ambulatory Visit (INDEPENDENT_AMBULATORY_CARE_PROVIDER_SITE_OTHER): Payer: Self-pay | Admitting: Cardiovascular Disease

## 2011-05-12 ENCOUNTER — Encounter: Payer: Self-pay | Admitting: Cardiovascular Disease

## 2011-05-12 VITALS — BP 112/76 | HR 53 | Wt 207.0 lb

## 2011-05-12 DIAGNOSIS — J449 Chronic obstructive pulmonary disease, unspecified: Secondary | ICD-10-CM

## 2011-05-12 DIAGNOSIS — R079 Chest pain, unspecified: Secondary | ICD-10-CM

## 2011-05-12 DIAGNOSIS — I613 Nontraumatic intracerebral hemorrhage in brain stem: Secondary | ICD-10-CM

## 2011-05-12 DIAGNOSIS — I10 Essential (primary) hypertension: Secondary | ICD-10-CM

## 2011-05-12 DIAGNOSIS — I619 Nontraumatic intracerebral hemorrhage, unspecified: Secondary | ICD-10-CM

## 2011-05-12 NOTE — Assessment & Plan Note (Signed)
F/U Dr Everlene Other.  ? Musculoskeletal.  Cath with no flow limiting lesions

## 2011-05-12 NOTE — Patient Instructions (Signed)
Your physician recommends that you schedule a follow-up appointment in: AS NEEDED  Your physician recommends that you continue on your current medications as directed. Please refer to the Current Medication list given to you today.  

## 2011-05-12 NOTE — Assessment & Plan Note (Signed)
Improved continue current meds ?

## 2011-05-12 NOTE — Progress Notes (Signed)
Patient ID: Jonathon [name], male   DOB: 1950-10-16, 61 y.o.   MRN: [id number] 61 yo [name] with two recent hospitalizations for SSCP  Had cath on 04/29/11 with no obstructive disease.  40% LAD at D1 take off.  R/O normal LV function.  Previous pneumonectomy with chronic vocal chord hoarseness.  Pontine hemorage in 1/13 with rehab and left sided weakness improved.  HTN better controlled.  Still smoking Wife smokes and makes it hard for him to quit.  Was drinking before last hospital admission also but says he has stopped this.  Still with muscular sounding chest pain And dyspnea.  No cough, sputum or fever  ROS: Denies fever, malais, weight loss, blurry vision, decreased visual acuity, cough, sputum, SOB, hemoptysis, pleuritic pain, palpitaitons, heartburn, abdominal pain, melena, lower extremity edema, claudication, or rash.  All other systems reviewed and negative  General: Affect appropriate Chronically ill with hoarse voice HEENT: normal Neck supple with no adenopathy JVP normal no bruits no thyromegaly Lungs no wheezing poor air movement partial Right lobectomy Heart:  S1/S2 no murmur, no rub, gallop or click PMI normal Abdomen: benighn, BS positve, no tenderness, no AAA no bruit.  No HSM or HJR Distal pulses intact with no bruits cath site RFA well healed No edema Neuro non-focal Skin warm and dry No muscular weakness   Current Outpatient Prescriptions  Medication Sig Dispense Refill  . amLODipine (NORVASC) 10 MG tablet Take 10 mg by mouth daily.      Marland Kitchen aspirin EC 81 MG tablet Take 81 mg by mouth daily.      . cloNIDine (CATAPRES) 0.3 MG tablet Take 0.3 mg by mouth 2 (two) times daily.      Marland Kitchen gabapentin (NEURONTIN) 400 MG capsule Take 800 mg by mouth 3 (three) times daily.      . hydrochlorothiazide (HYDRODIURIL) 25 MG tablet Take 25 mg by mouth daily.      Marland Kitchen lisinopril (PRINIVIL,ZESTRIL) 20 MG tablet Take 20 mg by mouth 2 (two) times daily.      . metoprolol succinate  (TOPROL-XL) 100 MG 24 hr tablet Take 100 mg by mouth daily. Take with or immediately following a meal. Time taken unknown.      . nitroGLYCERIN (NITROSTAT) 0.4 MG SL tablet Place 1 tablet (0.4 mg total) under the tongue every 5 (five) minutes as needed. For chest pain  30 tablet  0  . nortriptyline (PAMELOR) 25 MG capsule Take 25 mg by mouth at bedtime.      Marland Kitchen DISCONTD: potassium chloride SA (K-DUR,KLOR-CON) 20 MEQ tablet Take 1 tablet (20 mEq total) by mouth 2 (two) times daily.  60 tablet  1    Allergies  Review of patient's allergies indicates no known allergies.  Electrocardiogram:  NSR rate 85 LAD otherwise normal  Assessment and Plan

## 2011-05-12 NOTE — Assessment & Plan Note (Signed)
Stable with rehab.  ASA  BP better controlled

## 2011-05-12 NOTE — Assessment & Plan Note (Signed)
Still smoking  F/U PFT;s with primary.  Counseled for less than 10 minutes.  Wont be successful unless wife quits

## 2011-08-29 ENCOUNTER — Encounter (HOSPITAL_COMMUNITY): Payer: Self-pay | Admitting: *Deleted

## 2011-08-29 ENCOUNTER — Emergency Department (HOSPITAL_COMMUNITY): Payer: Medicaid Other

## 2011-08-29 ENCOUNTER — Emergency Department (HOSPITAL_COMMUNITY)
Admission: EM | Admit: 2011-08-29 | Discharge: 2011-08-29 | Disposition: A | Discharge status: Home / Self Care | Payer: Medicaid Other | Attending: Emergency Medicine | Admitting: Emergency Medicine

## 2011-08-29 DIAGNOSIS — R5381 Other malaise: Secondary | ICD-10-CM | POA: Insufficient documentation

## 2011-08-29 DIAGNOSIS — R079 Chest pain, unspecified: Secondary | ICD-10-CM | POA: Insufficient documentation

## 2011-08-29 DIAGNOSIS — R5383 Other fatigue: Secondary | ICD-10-CM | POA: Insufficient documentation

## 2011-08-29 DIAGNOSIS — I69359 Hemiplegia and hemiparesis following cerebral infarction affecting unspecified side: Secondary | ICD-10-CM

## 2011-08-29 DIAGNOSIS — I69959 Hemiplegia and hemiparesis following unspecified cerebrovascular disease affecting unspecified side: Secondary | ICD-10-CM | POA: Insufficient documentation

## 2011-08-29 DIAGNOSIS — R209 Unspecified disturbances of skin sensation: Secondary | ICD-10-CM | POA: Insufficient documentation

## 2011-08-29 DIAGNOSIS — R279 Unspecified lack of coordination: Secondary | ICD-10-CM | POA: Insufficient documentation

## 2011-08-29 DIAGNOSIS — Z79899 Other long term (current) drug therapy: Secondary | ICD-10-CM | POA: Insufficient documentation

## 2011-08-29 DIAGNOSIS — I10 Essential (primary) hypertension: Secondary | ICD-10-CM | POA: Insufficient documentation

## 2011-08-29 LAB — COMPREHENSIVE METABOLIC PANEL
ALT: 19 U/L (ref 0–53)
AST: 18 U/L (ref 0–37)
Albumin: 3.5 g/dL (ref 3.5–5.2)
Alkaline Phosphatase: 73 U/L (ref 39–117)
BUN: 9 mg/dL (ref 6–23)
CO2: 33 mEq/L — ABNORMAL HIGH (ref 19–32)
Calcium: 9.7 mg/dL (ref 8.4–10.5)
Chloride: 97 mEq/L (ref 96–112)
Creatinine, Ser: 1.04 mg/dL (ref 0.50–1.35)
GFR calc Af Amer: 88 mL/min — ABNORMAL LOW (ref >90)
GFR calc non Af Amer: 76 mL/min — ABNORMAL LOW (ref >90)
Glucose, Bld: 126 mg/dL — ABNORMAL HIGH (ref 70–99)
Potassium: 4 mEq/L (ref 3.5–5.1)
Sodium: 138 mEq/L (ref 135–145)
Total Bilirubin: 0.3 mg/dL (ref 0.3–1.2)
Total Protein: 7.9 g/dL (ref 6.0–8.3)

## 2011-08-29 LAB — CBC WITH DIFFERENTIAL/PLATELET
Basophils Absolute: 0 10*3/uL (ref 0.0–0.1)
Basophils Relative: 0 % (ref 0–1)
Eosinophils Absolute: 0.2 10*3/uL (ref 0.0–0.7)
Eosinophils Relative: 2 % (ref 0–5)
HCT: 47.4 % (ref 39.0–52.0)
Hemoglobin: 16.4 g/dL (ref 13.0–17.0)
Lymphocytes Relative: 41 % (ref 12–46)
Lymphs Abs: 4 10*3/uL (ref 0.7–4.0)
MCH: 30.6 pg (ref 26.0–34.0)
MCHC: 34.6 g/dL (ref 30.0–36.0)
MCV: 88.4 fL (ref 78.0–100.0)
Monocytes Absolute: 0.8 10*3/uL (ref 0.1–1.0)
Monocytes Relative: 8 % (ref 3–12)
Neutro Abs: 4.9 10*3/uL (ref 1.7–7.7)
Neutrophils Relative %: 49 % (ref 43–77)
Platelets: 202 10*3/uL (ref 150–400)
RBC: 5.36 MIL/uL (ref 4.22–5.81)
RDW: 13.8 % (ref 11.5–15.5)
WBC: 10 10*3/uL (ref 4.0–10.5)

## 2011-08-29 LAB — POCT I-STAT TROPONIN I: Troponin i, poc: 0 ng/mL (ref 0.00–0.08)

## 2011-08-29 MED ORDER — METOPROLOL SUCCINATE ER 100 MG PO TB24: 100.0000 mg | ORAL_TABLET | Freq: Every day | ORAL | Status: DC

## 2011-08-29 MED ORDER — GABAPENTIN 800 MG PO TABS: 800.0000 mg | ORAL_TABLET | Freq: Three times a day (TID) | ORAL | Status: DC

## 2011-08-29 NOTE — ED Notes (Signed)
The pt has had mid-chest pain with rt arm radiation  For 2-3 days with sob dizziness and nausea.  Stroke in January since then he has had numbness and tingling  On his tongue and his lips.  He has been out of neurontin for awhile

## 2011-08-29 NOTE — ED Provider Notes (Signed)
History     CSN: [id number]  Arrival date & time 08/29/11  1522   First MD Initiated Contact with Patient 08/29/11 2006      Chief Complaint  Patient presents with  . Chest Pain  . Stroke Symptoms    (Consider location/radiation/quality/duration/timing/severity/associated sxs/prior treatment) HPI Comments: Jonathon [name] is a 61 y.o. Male patient has had stiffness and numbness right hemispheric since stopping his gabapentin 5 days ago. He ran out of his usual prescription and cannot afford a new one. He has noticed some chest pain hat is not ongoing. He denies fever, chills, nausea, or vomiting. There no other aggravating or palliative factors.   Patient is a 61 y.o. male presenting with chest pain. The history is provided by the patient.  Chest Pain     Past Medical History  Diagnosis Date  . CHF (congestive heart failure)   . COPD (chronic obstructive pulmonary disease)   . Hypertension   . Smoker   . Stroke   . Chest pain   . Shortness of breath     Past Surgical History  Procedure Date  . Hernia repair   . Lobectomy     Family History  Problem Relation Age of Onset  . Hypertension      History  Substance Use Topics  . Smoking status: Current Everyday Smoker -- 1.0 packs/day for 45 years    Types: Cigarettes  . Smokeless tobacco: Never Used  . Alcohol Use: Yes      Review of Systems  Cardiovascular: Positive for chest pain.  All other systems reviewed and are negative.    Allergies  Review of patient's allergies indicates no known allergies.  Home Medications   Current Outpatient Rx  Name Route Sig Dispense Refill  . AMLODIPINE BESYLATE 10 MG PO TABS Oral Take 10 mg by mouth daily.    . ASPIRIN EC 81 MG PO TBEC Oral Take 81 mg by mouth daily.    Marland Kitchen CLONIDINE HCL 0.3 MG PO TABS Oral Take 0.3 mg by mouth 2 (two) times daily.    Marland Kitchen GABAPENTIN 400 MG PO CAPS Oral Take 800 mg by mouth 3 (three) times daily.    Marland Kitchen HYDROCHLOROTHIAZIDE 25 MG PO  TABS Oral Take 25 mg by mouth daily.    Marland Kitchen LISINOPRIL 20 MG PO TABS Oral Take 20 mg by mouth 2 (two) times daily.    Marland Kitchen METOPROLOL SUCCINATE ER 100 MG PO TB24 Oral Take 100 mg by mouth daily. Take with or immediately following a meal. Time taken unknown.    Marland Kitchen NORTRIPTYLINE HCL 25 MG PO CAPS Oral Take 25 mg by mouth at bedtime.    Marland Kitchen GABAPENTIN 800 MG PO TABS Oral Take 1 tablet (800 mg total) by mouth 3 (three) times daily. 9 tablet 0  . GABAPENTIN 800 MG PO TABS Oral Take 1 tablet (800 mg total) by mouth 3 (three) times daily. 90 tablet 0  . METOPROLOL SUCCINATE ER 100 MG PO TB24 Oral Take 1 tablet (100 mg total) by mouth daily. Take with or immediately following a meal. 3 tablet 0  . NITROGLYCERIN 0.4 MG SL SUBL Sublingual Place 1 tablet (0.4 mg total) under the tongue every 5 (five) minutes as needed. For chest pain 30 tablet 0    BP 143/77  Pulse 63  Temp 98 F (36.7 C) (Oral)  Resp 21  SpO2 95%  Physical Exam  Nursing note and vitals reviewed. Constitutional: He is oriented to person, place, and  time. He appears well-developed and well-nourished.  HENT:  Head: Normocephalic and atraumatic.  Right Ear: External ear normal.  Left Ear: External ear normal.  Eyes: Conjunctivae and EOM are normal. Pupils are equal, round, and reactive to light.  Neck: Normal range of motion and phonation normal. Neck supple.  Cardiovascular: Normal rate, regular rhythm, normal heart sounds and intact distal pulses.   Pulmonary/Chest: Effort normal and breath sounds normal. He exhibits no bony tenderness.  Abdominal: Soft. Normal appearance. There is no tenderness.  Musculoskeletal: Normal range of motion.  Neurological: He is alert and oriented to person, place, and time. He has normal strength. No cranial nerve deficit or sensory deficit.       His mild, subjective numbness, right arm, and right leg. He has dis-coordination right arm and leg.  Skin: Skin is warm, dry and intact.  Psychiatric: He has a  normal mood and affect. His behavior is normal. Judgment and thought content normal.    ED Course  Procedures (including critical care time)   We were able to secure. 3 days' worth of his gabapentin and Toprol, from the pharmacy, emergency fund.    Date: 08/29/2011  Rate: 63  Rhythm: normal sinus rhythm  QRS Axis: normal  Intervals: normal  ST/T Wave abnormalities: nonspecific T wave changes  Conduction Disutrbances:left anterior fascicular block  Narrative Interpretation:   Old EKG Reviewed: unchanged   Labs Reviewed  COMPREHENSIVE METABOLIC PANEL - Abnormal; Notable for the following:    CO2 33 (*)     Glucose, Bld 126 (*)     GFR calc non Af Amer 76 (*)     GFR calc Af Amer 88 (*)     All other components within normal limits  CBC WITH DIFFERENTIAL  POCT I-STAT TROPONIN I  URINALYSIS, ROUTINE W REFLEX MICROSCOPIC   Dg Chest 2 View  08/29/2011  *RADIOLOGY REPORT*  Clinical Data: Chest pain, stroke symptoms  CHEST - 2 VIEW  Comparison: 04/29/2011  Findings: Cardiomediastinal silhouette is stable.  Surgical clips right hilum again noted.  No acute infiltrate or pleural effusion. No pulmonary edema. Mild hyperinflation again noted. Again noted status post right upper thoracotomy.  IMPRESSION: No active disease. Mild hyperinflation again noted.  Original Report Authenticated By: Natasha Mead, [name]   Ct Head Wo Contrast  08/29/2011  *RADIOLOGY REPORT*  Clinical Data: Weakness, right-sided numbness  CT HEAD WITHOUT CONTRAST  Technique:  Contiguous axial images were obtained from the base of the skull through the vertex without contrast.  Comparison: Head CT 04/30/2011  Findings: No acute intracranial hemorrhage.  No focal mass lesion. No CT evidence of acute infarction.   No midline shift or mass effect.  No hydrocephalus.  Basilar cisterns are patent.  There are periventricular and subcortical white matter hypodensities which are not changed from prior.  Stable CSF density in the  posterior fossa likely represents arachnoid cyst.  Paranasal sinuses and mastoid air cells are clear.  Orbits are normal.  IMPRESSION:  1.  No acute intracranial findings. 2.  Atrophy and microvascular disease similar to prior.  Original Report Authenticated By: Genevive Bi, [name]     1. CVA, old, hemiparesis       MDM  Patient has recurrent and worsening symptoms after stopping his gabapentin, that are exacerbation of his prior symptoms, and related to his prior stroke. Doubt new stroke, ACS, metabolic instability, or occult infection. Patient stable for discharge.   Plan: Home Medications- usual; Home Treatments- rest; Recommended folIlow up-  PCP 1 week.  I wrote a new prescription for gabapentin  so he can shop around prior to purchasing it         Flint Melter, MD 08/29/11 2245

## 2011-08-29 NOTE — ED Notes (Signed)
Pt had stroke in Jan 2013 and then increased in weakness to right side and numbness to tongue over last 2 days.  Pt reports onset of chest pain 2 days ago.

## 2011-08-29 NOTE — ED Notes (Signed)
Spoke with pt about having no insurance/medication assistance.  Pt given Needymeds info and applications to assist him with his Rxs.  CSW assisted pt with getting assistance with Rxs from [facility].  Emotional support also offered.

## 2011-10-11 ENCOUNTER — Telehealth: Payer: Self-pay | Admitting: *Deleted

## 2011-10-11 NOTE — Telephone Encounter (Signed)
The patient cannot be admitted from home to inpatient rehabilitation.  His best course of action is to be evaluated by his primary care physician To see if he needs skilled nursing care

## 2011-10-11 NOTE — Telephone Encounter (Signed)
Wife Marylu Lund calling to ask about Brandn going back to rehabilitation facility--Dr Kirsteins would have to ok.

## 2011-10-13 ENCOUNTER — Telehealth: Payer: Self-pay | Admitting: *Deleted

## 2011-10-17 NOTE — Telephone Encounter (Signed)
Left 2nd message for Mrs Rivadeneira to return my call.

## 2011-11-01 ENCOUNTER — Ambulatory Visit: Payer: Medicaid Other | Attending: Family Medicine | Admitting: Physical Therapy

## 2011-11-01 DIAGNOSIS — IMO0001 Reserved for inherently not codable concepts without codable children: Secondary | ICD-10-CM | POA: Insufficient documentation

## 2011-11-01 DIAGNOSIS — R5381 Other malaise: Secondary | ICD-10-CM | POA: Insufficient documentation

## 2011-11-01 DIAGNOSIS — I69998 Other sequelae following unspecified cerebrovascular disease: Secondary | ICD-10-CM | POA: Insufficient documentation

## 2011-11-01 DIAGNOSIS — M6281 Muscle weakness (generalized): Secondary | ICD-10-CM | POA: Insufficient documentation

## 2011-11-28 ENCOUNTER — Emergency Department (HOSPITAL_COMMUNITY)
Admission: EM | Admit: 2011-11-28 | Discharge: 2011-11-28 | Disposition: A | Discharge status: Home / Self Care | Payer: Medicaid Other | Attending: Emergency Medicine | Admitting: Emergency Medicine

## 2011-11-28 ENCOUNTER — Encounter (HOSPITAL_COMMUNITY): Payer: Self-pay | Admitting: Emergency Medicine

## 2011-11-28 ENCOUNTER — Emergency Department (HOSPITAL_COMMUNITY)
Admission: EM | Admit: 2011-11-28 | Discharge: 2011-11-28 | Disposition: A | Discharge status: Home / Self Care | Payer: Medicaid Other | Source: Home / Self Care

## 2011-11-28 DIAGNOSIS — J4489 Other specified chronic obstructive pulmonary disease: Secondary | ICD-10-CM | POA: Insufficient documentation

## 2011-11-28 DIAGNOSIS — I10 Essential (primary) hypertension: Secondary | ICD-10-CM | POA: Insufficient documentation

## 2011-11-28 DIAGNOSIS — Z7982 Long term (current) use of aspirin: Secondary | ICD-10-CM | POA: Insufficient documentation

## 2011-11-28 DIAGNOSIS — Z8673 Personal history of transient ischemic attack (TIA), and cerebral infarction without residual deficits: Secondary | ICD-10-CM | POA: Insufficient documentation

## 2011-11-28 DIAGNOSIS — R0989 Other specified symptoms and signs involving the circulatory and respiratory systems: Secondary | ICD-10-CM | POA: Insufficient documentation

## 2011-11-28 DIAGNOSIS — I509 Heart failure, unspecified: Secondary | ICD-10-CM | POA: Insufficient documentation

## 2011-11-28 DIAGNOSIS — F17200 Nicotine dependence, unspecified, uncomplicated: Secondary | ICD-10-CM | POA: Insufficient documentation

## 2011-11-28 DIAGNOSIS — L02219 Cutaneous abscess of trunk, unspecified: Secondary | ICD-10-CM | POA: Insufficient documentation

## 2011-11-28 DIAGNOSIS — R0609 Other forms of dyspnea: Secondary | ICD-10-CM | POA: Insufficient documentation

## 2011-11-28 DIAGNOSIS — L02213 Cutaneous abscess of chest wall: Secondary | ICD-10-CM

## 2011-11-28 DIAGNOSIS — L03319 Cellulitis of trunk, unspecified: Secondary | ICD-10-CM | POA: Insufficient documentation

## 2011-11-28 DIAGNOSIS — R079 Chest pain, unspecified: Secondary | ICD-10-CM | POA: Insufficient documentation

## 2011-11-28 DIAGNOSIS — Z79899 Other long term (current) drug therapy: Secondary | ICD-10-CM | POA: Insufficient documentation

## 2011-11-28 DIAGNOSIS — J449 Chronic obstructive pulmonary disease, unspecified: Secondary | ICD-10-CM | POA: Insufficient documentation

## 2011-11-28 MED ORDER — HYDROCODONE-ACETAMINOPHEN 5-325 MG PO TABS
1.0000 | ORAL_TABLET | Freq: Once | ORAL | Status: AC
  Administered 2011-11-28: 1 via ORAL
  Filled 2011-11-28: qty 1

## 2011-11-28 MED ORDER — HYDROCODONE-ACETAMINOPHEN 5-325 MG PO TABS: 1.0000 | ORAL_TABLET | ORAL | Status: DC | PRN

## 2011-11-28 MED ORDER — LIDOCAINE-EPINEPHRINE (PF) 2 %-1:200000 IJ SOLN
20.0000 mL | Freq: Once | INTRAMUSCULAR | Status: AC
  Administered 2011-11-28: 20 mL
  Filled 2011-11-28: qty 20

## 2011-11-28 MED ORDER — LIDOCAINE-EPINEPHRINE 2 %-1:100000 IJ SOLN
20.0000 mL | Freq: Once | INTRAMUSCULAR | Status: DC
  Filled 2011-11-28: qty 20

## 2011-11-28 MED ORDER — CLINDAMYCIN HCL 150 MG PO CAPS: 300.0000 mg | ORAL_CAPSULE | Freq: Three times a day (TID) | ORAL | Status: DC

## 2011-11-28 NOTE — ED Provider Notes (Signed)
History  This chart was scribed for Loren Racer, MD by Shari Heritage and Leone Payor, ER Scribes. The patient was seen in room TR06C/TR06C. Patient's care was started at 1156.  CSN: [id number]  Arrival date & time 11/28/11  1125   First MD Initiated Contact with Patient 11/28/11 1156      Chief Complaint  Patient presents with  . Abscess     The history is provided by the patient. No language interpreter was used.    HPI Comments: Jonathon [name] is a 61 y.o. male who presents to the Emergency Department complaining of a painful, erythematous, gradually worsening abscess to the right side of chest under breast. Pt reports prior abscess to right forehead that required incision and drainage. Pt denies fever, chills, nausea or vomiting. Patient has a medical history of stroke (January 2013), CHF, COPD and HTN.  Past Medical History  Diagnosis Date  . CHF (congestive heart failure)   . COPD (chronic obstructive pulmonary disease)   . Hypertension   . Smoker   . Stroke   . Chest pain   . Shortness of breath     Past Surgical History  Procedure Date  . Hernia repair   . Lobectomy     Family History  Problem Relation Age of Onset  . Hypertension      History  Substance Use Topics  . Smoking status: Current Every Day Smoker -- 1.0 packs/day for 45 years    Types: Cigarettes  . Smokeless tobacco: Never Used  . Alcohol Use: Yes      Review of Systems  Constitutional: Negative for fever and chills.  Gastrointestinal: Negative for nausea and vomiting.  Skin: Positive for rash (Positive for erythema to right breast).    Allergies  Review of patient's allergies indicates no known allergies.  Home Medications   Current Outpatient Rx  Name  Route  Sig  Dispense  Refill  . ALBUTEROL SULFATE HFA 108 (90 BASE) MCG/ACT IN AERS   Inhalation   Inhale 2 puffs into the lungs every 6 (six) hours as needed. For COPD         . AMLODIPINE BESYLATE 10 MG PO TABS  Oral   Take 10 mg by mouth daily.         . ASPIRIN EC 81 MG PO TBEC   Oral   Take 81 mg by mouth daily.         Marland Kitchen CLONIDINE HCL 0.3 MG PO TABS   Oral   Take 0.3 mg by mouth 2 (two) times daily.         Marland Kitchen GABAPENTIN 800 MG PO TABS   Oral   Take 1 tablet (800 mg total) by mouth 3 (three) times daily.   9 tablet   0   . HYDROCHLOROTHIAZIDE 25 MG PO TABS   Oral   Take 25 mg by mouth daily.         Marland Kitchen LISINOPRIL 20 MG PO TABS   Oral   Take 20 mg by mouth 2 (two) times daily.         Marland Kitchen METOPROLOL SUCCINATE ER 100 MG PO TB24   Oral   Take 1 tablet (100 mg total) by mouth daily. Take with or immediately following a meal.   3 tablet   0   . NITROGLYCERIN 0.4 MG SL SUBL   Sublingual   Place 1 tablet (0.4 mg total) under the tongue every 5 (five) minutes as needed. For chest pain  30 tablet   0   . CLINDAMYCIN HCL 150 MG PO CAPS   Oral   Take 2 capsules (300 mg total) by mouth 3 (three) times daily.   30 capsule   0   . HYDROCODONE-ACETAMINOPHEN 5-325 MG PO TABS   Oral   Take 1 tablet by mouth every 4 (four) hours as needed for pain.   10 tablet   0   . HYDROCODONE-ACETAMINOPHEN 5-325 MG PO TABS   Oral   Take 2 tablets by mouth every 4 (four) hours as needed for pain.   10 tablet   0     Triage Vitals: BP 159/106  Pulse 90  Temp 98.5 F (36.9 C) (Oral)  Resp 18  SpO2 96%  Physical Exam  Nursing note and vitals reviewed. Constitutional: He is oriented to person, place, and time. He appears well-developed and well-nourished. No distress.  HENT:  Head: Normocephalic and atraumatic.  Eyes: EOM are normal.  Neck: Neck supple. No tracheal deviation present.  Cardiovascular: Normal rate.   Pulmonary/Chest: Effort normal. No respiratory distress.  Musculoskeletal: Normal range of motion.  Neurological: He is alert and oriented to person, place, and time.  Skin: Skin is warm and dry.       Right lateral breast with surrounding erythema, fluctuance  and fullness. As seen on BS ultrasound, uncomplicated abscess present.   Psychiatric: He has a normal mood and affect. His behavior is normal.    ED Course  Procedures (including critical care time)  DIAGNOSTIC STUDIES: Oxygen Saturation is 96% on room air, adequate by my interpretation.    COORDINATION OF CARE: 12:00pm- Patient informed of current plan for treatment and evaluation and agrees with plan at this time.     1. Abscess of chest wall       MDM  I personally performed the services described in this documentation, which was scribed in my presence. The recorded information has been reviewed and is accurate.  I and D performed by PA. Advised to return in 2 days for wound check.   Loren Racer, MD 12/11/11 1114

## 2011-11-28 NOTE — ED Provider Notes (Signed)
Medical screening examination/treatment/procedure(s) were conducted as a shared visit with non-physician practitioner(s) and myself.  I personally evaluated the patient during the encounter   Loren Racer, MD 11/28/11 1459

## 2011-11-28 NOTE — ED Notes (Signed)
Pt c/o abscess to right side of chest; pt sts hx of same and here to have lanced; pt denies drainage

## 2011-11-28 NOTE — ED Provider Notes (Signed)
Incision and drainage performed in fast track area at the request of attending Dr. Ranae Palms as follows:  INCISION AND DRAINAGE Performed by: Wynetta Emery Consent: Verbal consent obtained. Risks and benefits: risks, benefits and alternatives were discussed Type: abscess  Body area: Right chest  Anesthesia: local infiltration  Local anesthetic: lidocaine 2% with epinephrine  Anesthetic total: 5 ml  Complexity: complex Blunt dissection to break up loculations  Drainage: purulent  Drainage amount: 5   Packing material: None   Patient tolerance: Patient tolerated the procedure well with no immediate complications.    Wynetta Emery, PA-C 11/28/11 1242

## 2011-11-30 ENCOUNTER — Emergency Department (HOSPITAL_COMMUNITY)
Admission: EM | Admit: 2011-11-30 | Discharge: 2011-11-30 | Disposition: A | Discharge status: Home / Self Care | Payer: Medicaid Other | Attending: Emergency Medicine | Admitting: Emergency Medicine

## 2011-11-30 ENCOUNTER — Encounter (HOSPITAL_COMMUNITY): Payer: Self-pay | Admitting: Emergency Medicine

## 2011-11-30 DIAGNOSIS — I10 Essential (primary) hypertension: Secondary | ICD-10-CM | POA: Insufficient documentation

## 2011-11-30 DIAGNOSIS — N611 Abscess of the breast and nipple: Secondary | ICD-10-CM

## 2011-11-30 DIAGNOSIS — I509 Heart failure, unspecified: Secondary | ICD-10-CM | POA: Insufficient documentation

## 2011-11-30 DIAGNOSIS — J449 Chronic obstructive pulmonary disease, unspecified: Secondary | ICD-10-CM | POA: Insufficient documentation

## 2011-11-30 DIAGNOSIS — Z7982 Long term (current) use of aspirin: Secondary | ICD-10-CM | POA: Insufficient documentation

## 2011-11-30 DIAGNOSIS — Z79899 Other long term (current) drug therapy: Secondary | ICD-10-CM | POA: Insufficient documentation

## 2011-11-30 DIAGNOSIS — N61 Mastitis without abscess: Secondary | ICD-10-CM | POA: Insufficient documentation

## 2011-11-30 DIAGNOSIS — F17200 Nicotine dependence, unspecified, uncomplicated: Secondary | ICD-10-CM | POA: Insufficient documentation

## 2011-11-30 DIAGNOSIS — J4489 Other specified chronic obstructive pulmonary disease: Secondary | ICD-10-CM | POA: Insufficient documentation

## 2011-11-30 DIAGNOSIS — Z8673 Personal history of transient ischemic attack (TIA), and cerebral infarction without residual deficits: Secondary | ICD-10-CM | POA: Insufficient documentation

## 2011-11-30 MED ORDER — HYDROCODONE-ACETAMINOPHEN 5-325 MG PO TABS
2.0000 | ORAL_TABLET | Freq: Once | ORAL | Status: AC
  Administered 2011-11-30: 2 via ORAL
  Filled 2011-11-30: qty 2

## 2011-11-30 MED ORDER — HYDROCODONE-ACETAMINOPHEN 5-325 MG PO TABS: 2.0000 | ORAL_TABLET | ORAL | Status: DC | PRN

## 2011-11-30 NOTE — ED Notes (Signed)
Pt here for packing to be removed from abscess on chest

## 2011-11-30 NOTE — ED Provider Notes (Signed)
History  This chart was scribed for Doug Sou, MD by Shari Heritage, ED Scribe. The patient was seen in room TR05C/TR05C. Patient's care was started at 1234.     CSN: [id number]  Arrival date & time 11/30/11  1037   First MD Initiated Contact with Patient 11/30/11 1234      Chief Complaint  Patient presents with  . Wound Check    The history is provided by the patient and medical records. No language interpreter was used.    HPI Comments: Jonathon [name] is a 61 y.o. male with a history of skin infections who presents to the Emergency Department requesting wound recheck and packing removal for an abscess to his right chest. Patient reports that there is still some moderate soreness and erythema to the area. He was seen here on Monday, November 11 complaining of an abscess. He was evaluated by Dr. Loren Racer and had an I&D procedure performed by Wynetta Emery, PA. Per medical records, there was a significant amount of purulent drainage from the site. Patient has a medical history of CHF, stroke, COPD and HTN. His surgical history includes hernia repair and lobectomy. He is a current every day smoker.  Past Medical History  Diagnosis Date  . CHF (congestive heart failure)   . COPD (chronic obstructive pulmonary disease)   . Hypertension   . Smoker   . Stroke   . Chest pain   . Shortness of breath     Past Surgical History  Procedure Date  . Hernia repair   . Lobectomy     Family History  Problem Relation Age of Onset  . Hypertension      History  Substance Use Topics  . Smoking status: Current Every Day Smoker -- 1.0 packs/day for 45 years    Types: Cigarettes  . Smokeless tobacco: Never Used  . Alcohol Use: Yes      Review of Systems  Constitutional: Negative.   HENT: Negative.   Respiratory: Negative.   Cardiovascular: Negative.   Gastrointestinal: Negative.   Musculoskeletal: Negative.   Skin: Positive for wound.  Neurological: Negative.     Hematological: Negative.   Psychiatric/Behavioral: Negative.     Allergies  Review of patient's allergies indicates no known allergies.  Home Medications   Current Outpatient Rx  Name  Route  Sig  Dispense  Refill  . AMLODIPINE BESYLATE 10 MG PO TABS   Oral   Take 10 mg by mouth daily.         . ASPIRIN EC 81 MG PO TBEC   Oral   Take 81 mg by mouth daily.         Marland Kitchen CLINDAMYCIN HCL 150 MG PO CAPS   Oral   Take 2 capsules (300 mg total) by mouth 3 (three) times daily.   30 capsule   0   . CLONIDINE HCL 0.3 MG PO TABS   Oral   Take 0.3 mg by mouth 2 (two) times daily.         Marland Kitchen GABAPENTIN 800 MG PO TABS   Oral   Take 1 tablet (800 mg total) by mouth 3 (three) times daily.   9 tablet   0   . HYDROCHLOROTHIAZIDE 25 MG PO TABS   Oral   Take 25 mg by mouth daily.         Marland Kitchen HYDROCODONE-ACETAMINOPHEN 5-325 MG PO TABS   Oral   Take 1 tablet by mouth every 4 (four) hours as needed for  pain.   10 tablet   0   . LISINOPRIL 20 MG PO TABS   Oral   Take 20 mg by mouth 2 (two) times daily.         Marland Kitchen METOPROLOL SUCCINATE ER 100 MG PO TB24   Oral   Take 1 tablet (100 mg total) by mouth daily. Take with or immediately following a meal.   3 tablet   0   . ALBUTEROL SULFATE HFA 108 (90 BASE) MCG/ACT IN AERS   Inhalation   Inhale 2 puffs into the lungs every 6 (six) hours as needed. For COPD         . NITROGLYCERIN 0.4 MG SL SUBL   Sublingual   Place 1 tablet (0.4 mg total) under the tongue every 5 (five) minutes as needed. For chest pain   30 tablet   0     Triage Vitals: BP 119/83  Pulse 60  Temp 98.4 F (36.9 C) (Oral)  Resp 18  SpO2 96%  Physical Exam  Nursing note and vitals reviewed. Constitutional: He is oriented to person, place, and time. He appears well-developed and well-nourished. No distress.  HENT:  Head: Normocephalic and atraumatic.  Eyes: EOM are normal. Pupils are equal, round, and reactive to light.  Neck: Neck supple. No  tracheal deviation present.  Cardiovascular: Normal rate.   Pulmonary/Chest: Effort normal. No respiratory distress.  Abdominal: Soft. He exhibits no distension.  Musculoskeletal: Normal range of motion. He exhibits no edema.  Neurological: He is alert and oriented to person, place, and time. No sensory deficit.  Skin: Skin is warm and dry.  Psychiatric: He has a normal mood and affect. His behavior is normal.   right breast there is a wound with fluctuance, draining white pus with surrounding tenderness.  ED Course  Procedures (including critical care time) DIAGNOSTIC STUDIES: Oxygen Saturation is 96% on room air, adequate by my interpretation.    COORDINATION OF CARE: 12:37 PM- Patient informed of current plan for treatment and evaluation and agrees with plan at this time. Patient did not drive himself. Pus was expressed from the wound of right breast with direct pressure. The abscess at the right breast was opened further   12:57 PM- Performed a repeat I&D to allow further drainage.  INCISION AND DRAINAGE PROCEDURE NOTE: Patient identification was confirmed and verbal consent was obtained. This procedure was performed by Doug Sou, MD at 12:57 PM. Site: right breast  Needle size: [id number] Anesthetic used (type and amt): local infiltration, lidocaine 2% with epinephrine,2  ml Blade size: 11 Drainage: Moderate Complexity: Complex  Site anesthetized, incision made over site, wound drained and explored loculations, rinsed with copious amounts of normal saline, wound packed with sterile gauze, covered with dry, sterile dressing.  Pt tolerated procedure well without complications.  Instructions for care discussed verbally and pt provided with additional written instructions for homecare and f/u.   No diagnosis found.    MDM  Plan prescription Vicodin wound check in 2 days if not improving Diagnosis abscess right breast       I personally performed the services  described in this documentation, which was scribed in my presence. The recorded information has been reviewed and is accurate.    Doug Sou, MD 11/30/11 1314

## 2011-12-01 LAB — TISSUE CULTURE: Gram Stain: NONE SEEN

## 2011-12-02 NOTE — ED Notes (Addendum)
+   Tissue-Staph. Species Chart sent to EDP office for review.

## 2011-12-04 NOTE — ED Notes (Signed)
Chart returned from EDP office. Per [facility] PA-C, no further treatment necessary at this time.

## 2012-03-06 ENCOUNTER — Ambulatory Visit: Payer: Medicaid Other | Admitting: Physical Therapy

## 2012-03-06 ENCOUNTER — Ambulatory Visit: Payer: Medicaid Other | Attending: Family Medicine | Admitting: Occupational Therapy

## 2012-03-06 ENCOUNTER — Ambulatory Visit: Payer: Medicaid Other

## 2012-03-06 DIAGNOSIS — I69998 Other sequelae following unspecified cerebrovascular disease: Secondary | ICD-10-CM | POA: Insufficient documentation

## 2012-03-06 DIAGNOSIS — IMO0001 Reserved for inherently not codable concepts without codable children: Secondary | ICD-10-CM | POA: Insufficient documentation

## 2012-03-06 DIAGNOSIS — R279 Unspecified lack of coordination: Secondary | ICD-10-CM | POA: Insufficient documentation

## 2012-03-06 DIAGNOSIS — M6281 Muscle weakness (generalized): Secondary | ICD-10-CM | POA: Insufficient documentation

## 2012-03-13 ENCOUNTER — Ambulatory Visit: Payer: Medicaid Other | Admitting: Occupational Therapy

## 2012-03-21 ENCOUNTER — Ambulatory Visit: Payer: Medicaid Other | Admitting: Occupational Therapy

## 2012-03-29 ENCOUNTER — Ambulatory Visit: Payer: Medicaid Other | Attending: Family Medicine | Admitting: Occupational Therapy

## 2012-03-29 DIAGNOSIS — IMO0001 Reserved for inherently not codable concepts without codable children: Secondary | ICD-10-CM | POA: Insufficient documentation

## 2012-03-29 DIAGNOSIS — R279 Unspecified lack of coordination: Secondary | ICD-10-CM | POA: Insufficient documentation

## 2012-03-29 DIAGNOSIS — M6281 Muscle weakness (generalized): Secondary | ICD-10-CM | POA: Insufficient documentation

## 2012-03-29 DIAGNOSIS — I69998 Other sequelae following unspecified cerebrovascular disease: Secondary | ICD-10-CM | POA: Insufficient documentation

## 2012-04-03 ENCOUNTER — Ambulatory Visit: Payer: Medicaid Other | Admitting: Occupational Therapy

## 2012-04-12 ENCOUNTER — Other Ambulatory Visit: Payer: Self-pay | Admitting: Urology

## 2012-04-12 ENCOUNTER — Ambulatory Visit: Payer: Medicaid Other | Admitting: Occupational Therapy

## 2012-04-16 DIAGNOSIS — I5030 Unspecified diastolic (congestive) heart failure: Secondary | ICD-10-CM | POA: Insufficient documentation

## 2012-04-18 ENCOUNTER — Encounter (HOSPITAL_BASED_OUTPATIENT_CLINIC_OR_DEPARTMENT_OTHER): Payer: Self-pay | Admitting: *Deleted

## 2012-04-19 ENCOUNTER — Ambulatory Visit: Payer: Medicaid Other | Admitting: *Deleted

## 2012-04-19 ENCOUNTER — Encounter (HOSPITAL_BASED_OUTPATIENT_CLINIC_OR_DEPARTMENT_OTHER): Payer: Self-pay | Admitting: *Deleted

## 2012-04-19 NOTE — Progress Notes (Signed)
NPO AFTER MN. ARRIVES AT 073O.  NEEDS ISTAT. CURRENT EKG AND CXR IN EPIC AND CHART. REQUESTED LOV NOTE FROM DR Everlene Other. PT DENIES TAKING REGULAR DAILY INHALER. CLEARANCE NOTE W/ CHART. WILL TAKE NORVASC AND CLONIDINE AM OF SURG W/ SIP OF WATER.

## 2012-04-20 ENCOUNTER — Encounter: Payer: Medicaid Other | Admitting: *Deleted

## 2012-04-23 NOTE — H&P (Signed)
History of Present Illness           F/u left-sided scrotal swelling referred by Dr. Tracey Harries March 2014. He has noticed left scrotal swelling for about a year. It seems to be getting larger. It is bothersome. It gets in the way ambulating and sitting. He has bilateral hernia repair -- the right when he was a child and the left in his 72's.    He voids with a good flow. He has a post-void dribble. He has no frequency. He has no urgency.   Comorbidities include a CVA last year, COPD. NTG use.   Jan 2012 PSA 0.18 Mar 2012 DRE normal  Another issue trouble with erections. He uses NTG about once a year for CP. He uses Viagra. I warned them about the combination and not to use PDE5i. He cannot get an erection hard enough to penetrate. He has a good libido.    Interval hx He returns to have a scrotal ultrasound, review the results. Also to discuss treatment of his erectile dysfunction.  Scrotal ultrasound today reveals the right testicle is normal in appearance without mass, the right epididymis has a few small scattered cysts. The left testicle is normal in appearance without mass. There is a large left hydrocele about 10 cm. Please see the technician sheet for details.   Past Medical History Problems  1. History of  Benign Essential Hypertension 401.1 2. History of  Cough 786.2 3. History of  Diastolic Congestive Heart Failure 428.30 4. History of  Difficulty Breathing (Dyspnea) 786.09 5. History of  Esophageal Reflux 530.81 6. History of  Hydrocele Bilateral 603.9 7. History of  Insomnia 780.52 8. History of  Nicotine Dependence 305.1 9. History of  Vitamin D Deficiency 268.9  Surgical History Problems  1. History of  Inguinal Hernia Repair 2. History of  Lung Surgery  Current Meds 1. ALPRAZolam 0.5 MG Oral Tablet; Therapy: (Recorded:08Feb2012) to 2. Aspirin 81 MG Oral Tablet; Therapy: (Recorded:08Feb2012) to 3. Atenolol 25 MG Oral Tablet; Therapy: (Recorded:08Feb2012)  to 4. Lisinopril-Hydrochlorothiazide 20-12.5 MG Oral Tablet; Therapy: (Recorded:08Feb2012) to 5. Nitroglycerin SUBL; Therapy: (Recorded:08Feb2012) to 6. Pantoprazole Sodium TBEC; Therapy: (Recorded:08Feb2012) to  Allergies Medication  1. No Known Drug Allergies  Family History Problems  1. Paternal history of  Emphysema 2. Family history of  Family Health Status Number Of Children 2 sons and 2 daughters 3. Maternal history of  Hypertension V17.49  Social History Problems  1. Alcohol Use 2. Marital History - Currently Married wife named Marylu Lund 3. Occupation: Barista 4. Tobacco Use V15.82 1 ppd currently  Vitals Vital Signs [Data Includes: Last 1 Day]  26Mar2014 12:40PM  Blood Pressure: 138 / 83 Temperature: 98.2 F Heart Rate: 55  Physical Exam Constitutional: Well nourished and well developed . No acute distress.  Pulmonary: No respiratory distress and normal respiratory rhythm and effort.  Cardiovascular: Heart rate and rhythm are normal . No peripheral edema.  Neuro/Psych:. Mood and affect are appropriate.    Results/Data Urine [Data Includes: Last 1 Day]   26Mar2014  COLOR YELLOW   APPEARANCE CLEAR   SPECIFIC GRAVITY 1.010   pH 6.5   GLUCOSE NEG mg/dL  BILIRUBIN NEG   KETONE NEG mg/dL  BLOOD TRACE   PROTEIN NEG mg/dL  UROBILINOGEN 0.2 mg/dL  NITRITE NEG   LEUKOCYTE ESTERASE NEG   SQUAMOUS EPITHELIAL/HPF NONE SEEN   WBC NONE SEEN WBC/hpf  RBC 0-2 RBC/hpf  BACTERIA NONE SEEN   CRYSTALS NONE SEEN   CASTS  NONE SEEN    Discussion/Summary     I discussed Muse instructions, nature/R/B of therapy. We discussed alternatives again such as PDE5i, injections or the VED. Sample given.   In regards to his hydrocele we reviewed the scrotal US and nature, R/B of surveillance, aspiration, formal surgical excision. He wants to proceed with surgery. We discussed risks of hydrocelectomy such as infection, bleeding, anesthetic risks , CV / pulm risks among others. We  discussed typical post-op swelling and induration.  I'll have him see Dr. Everlene Other to ensure he doesnt need any cardiac eval.     Signatures Electronically signed by : Jerilee Field, [name]; Apr 11 2012  1:39PM  The patient saw Dr. Everlene Other and was cleared for surgery.

## 2012-04-24 ENCOUNTER — Ambulatory Visit (HOSPITAL_BASED_OUTPATIENT_CLINIC_OR_DEPARTMENT_OTHER): Payer: Medicaid Other | Admitting: Anesthesiology

## 2012-04-24 ENCOUNTER — Encounter (HOSPITAL_BASED_OUTPATIENT_CLINIC_OR_DEPARTMENT_OTHER): Payer: Self-pay | Admitting: Anesthesiology

## 2012-04-24 ENCOUNTER — Encounter (HOSPITAL_BASED_OUTPATIENT_CLINIC_OR_DEPARTMENT_OTHER): Payer: Self-pay | Admitting: *Deleted

## 2012-04-24 ENCOUNTER — Encounter (HOSPITAL_BASED_OUTPATIENT_CLINIC_OR_DEPARTMENT_OTHER)
Admission: RE | Disposition: A | Discharge status: Home / Self Care | Payer: Self-pay | Source: Ambulatory Visit | Attending: Urology

## 2012-04-24 ENCOUNTER — Ambulatory Visit (HOSPITAL_BASED_OUTPATIENT_CLINIC_OR_DEPARTMENT_OTHER)
Admission: RE | Admit: 2012-04-24 | Discharge: 2012-04-24 | Disposition: A | Discharge status: Home / Self Care | Payer: Medicaid Other | Source: Ambulatory Visit | Attending: Urology | Admitting: Urology

## 2012-04-24 DIAGNOSIS — Z79899 Other long term (current) drug therapy: Secondary | ICD-10-CM | POA: Insufficient documentation

## 2012-04-24 DIAGNOSIS — Z8673 Personal history of transient ischemic attack (TIA), and cerebral infarction without residual deficits: Secondary | ICD-10-CM | POA: Insufficient documentation

## 2012-04-24 DIAGNOSIS — J4489 Other specified chronic obstructive pulmonary disease: Secondary | ICD-10-CM | POA: Insufficient documentation

## 2012-04-24 DIAGNOSIS — I5030 Unspecified diastolic (congestive) heart failure: Secondary | ICD-10-CM | POA: Insufficient documentation

## 2012-04-24 DIAGNOSIS — J449 Chronic obstructive pulmonary disease, unspecified: Secondary | ICD-10-CM | POA: Insufficient documentation

## 2012-04-24 DIAGNOSIS — K219 Gastro-esophageal reflux disease without esophagitis: Secondary | ICD-10-CM | POA: Insufficient documentation

## 2012-04-24 DIAGNOSIS — I10 Essential (primary) hypertension: Secondary | ICD-10-CM | POA: Insufficient documentation

## 2012-04-24 DIAGNOSIS — N433 Hydrocele, unspecified: Secondary | ICD-10-CM | POA: Insufficient documentation

## 2012-04-24 DIAGNOSIS — I509 Heart failure, unspecified: Secondary | ICD-10-CM | POA: Insufficient documentation

## 2012-04-24 HISTORY — PX: HYDROCELE EXCISION: SHX482

## 2012-04-24 HISTORY — DX: Hydrocele, unspecified: N43.3

## 2012-04-24 HISTORY — DX: Dysphonia: R49.0

## 2012-04-24 HISTORY — DX: Weakness: R53.1

## 2012-04-24 HISTORY — DX: Chronic cough: R05.3

## 2012-04-24 HISTORY — DX: Shortness of breath: R06.02

## 2012-04-24 HISTORY — DX: Personal history of other diseases of the circulatory system: Z86.79

## 2012-04-24 HISTORY — DX: Encounter for other preprocedural examination: Z01.818

## 2012-04-24 HISTORY — DX: Chronic obstructive pulmonary disease, unspecified: J44.9

## 2012-04-24 HISTORY — DX: Cough: R05

## 2012-04-24 LAB — POCT I-STAT 4, (NA,K, GLUC, HGB,HCT)
Glucose, Bld: 120 mg/dL — ABNORMAL HIGH (ref 70–99)
HCT: 49 % (ref 39.0–52.0)
Hemoglobin: 16.7 g/dL (ref 13.0–17.0)
Potassium: 3.4 mEq/L — ABNORMAL LOW (ref 3.5–5.1)
Sodium: 140 mEq/L (ref 135–145)

## 2012-04-24 SURGERY — HYDROCELECTOMY
Anesthesia: Spinal | Site: Scrotum | Laterality: Left | Wound class: Clean

## 2012-04-24 MED ORDER — ACETAMINOPHEN 10 MG/ML IV SOLN
1000.0000 mg | Freq: Once | INTRAVENOUS | Status: DC | PRN
  Filled 2012-04-24: qty 100

## 2012-04-24 MED ORDER — KETOROLAC TROMETHAMINE 30 MG/ML IJ SOLN
INTRAMUSCULAR | Status: DC | PRN
  Administered 2012-04-24: 30 mg via INTRAVENOUS

## 2012-04-24 MED ORDER — LIDOCAINE HCL (CARDIAC) 20 MG/ML IV SOLN
INTRAVENOUS | Status: DC | PRN
  Administered 2012-04-24: 75 mg via INTRAVENOUS

## 2012-04-24 MED ORDER — PROPOFOL 10 MG/ML IV EMUL
INTRAVENOUS | Status: DC | PRN
  Administered 2012-04-24: 25 ug/kg/min via INTRAVENOUS

## 2012-04-24 MED ORDER — SODIUM CHLORIDE 0.9 % IR SOLN
Status: DC | PRN
  Administered 2012-04-24: 500 mL

## 2012-04-24 MED ORDER — BUPIVACAINE HCL 0.25 % IJ SOLN
INTRAMUSCULAR | Status: DC | PRN
  Administered 2012-04-24: 4 mL

## 2012-04-24 MED ORDER — CEFAZOLIN SODIUM 1-5 GM-% IV SOLN
1.0000 g | INTRAVENOUS | Status: DC
  Filled 2012-04-24: qty 50

## 2012-04-24 MED ORDER — PROMETHAZINE HCL 25 MG/ML IJ SOLN
6.2500 mg | INTRAMUSCULAR | Status: DC | PRN
  Filled 2012-04-24: qty 1

## 2012-04-24 MED ORDER — CEFAZOLIN SODIUM-DEXTROSE 2-3 GM-% IV SOLR
2.0000 g | INTRAVENOUS | Status: AC
  Administered 2012-04-24: 2 g via INTRAVENOUS
  Filled 2012-04-24: qty 50

## 2012-04-24 MED ORDER — HYDROMORPHONE HCL PF 1 MG/ML IJ SOLN
0.2500 mg | INTRAMUSCULAR | Status: DC | PRN
  Administered 2012-04-24: 0.25 mg via INTRAVENOUS
  Filled 2012-04-24: qty 1

## 2012-04-24 MED ORDER — MIDAZOLAM HCL 5 MG/5ML IJ SOLN
INTRAMUSCULAR | Status: DC | PRN
  Administered 2012-04-24 (×4): 0.5 mg via INTRAVENOUS

## 2012-04-24 MED ORDER — OXYCODONE HCL 5 MG/5ML PO SOLN
5.0000 mg | Freq: Once | ORAL | Status: DC | PRN
  Filled 2012-04-24: qty 5

## 2012-04-24 MED ORDER — ONDANSETRON HCL 4 MG/2ML IJ SOLN
INTRAMUSCULAR | Status: DC | PRN
  Administered 2012-04-24: 4 mg via INTRAVENOUS

## 2012-04-24 MED ORDER — OXYCODONE HCL 5 MG PO TABS
5.0000 mg | ORAL_TABLET | Freq: Once | ORAL | Status: DC | PRN
  Filled 2012-04-24: qty 1

## 2012-04-24 MED ORDER — FENTANYL CITRATE 0.05 MG/ML IJ SOLN
INTRAMUSCULAR | Status: DC | PRN
  Administered 2012-04-24: 50 ug via INTRAVENOUS

## 2012-04-24 MED ORDER — LACTATED RINGERS IV SOLN
INTRAVENOUS | Status: DC
  Administered 2012-04-24 (×4): via INTRAVENOUS
  Filled 2012-04-24: qty 1000

## 2012-04-24 MED ORDER — MEPERIDINE HCL 25 MG/ML IJ SOLN
6.2500 mg | INTRAMUSCULAR | Status: DC | PRN
  Filled 2012-04-24: qty 1

## 2012-04-24 MED ORDER — OXYCODONE-ACETAMINOPHEN 5-325 MG PO TABS: 1.0000 | ORAL_TABLET | ORAL | Status: DC | PRN

## 2012-04-24 MED ORDER — BUPIVACAINE HCL (PF) 0.5 % IJ SOLN
INTRAMUSCULAR | Status: DC | PRN
  Administered 2012-04-24: 2 mL

## 2012-04-24 MED ORDER — CEPHALEXIN 500 MG PO CAPS: 500.0000 mg | ORAL_CAPSULE | Freq: Two times a day (BID) | ORAL | Status: DC

## 2012-04-24 SURGICAL SUPPLY — 26 items
BANDAGE GAUZE ELAST BULKY 4 IN (GAUZE/BANDAGES/DRESSINGS) ×2 IMPLANT
BLADE SURG 15 STRL LF DISP TIS (BLADE) ×1 IMPLANT
BLADE SURG 15 STRL SS (BLADE) ×2
CLOTH BEACON ORANGE TIMEOUT ST (SAFETY) ×2 IMPLANT
COVER MAYO STAND STRL (DRAPES) ×2 IMPLANT
COVER TABLE BACK 60X90 (DRAPES) ×4 IMPLANT
DRAPE PED LAPAROTOMY (DRAPES) ×2 IMPLANT
ELECT REM PT RETURN 9FT ADLT (ELECTROSURGICAL) ×2
ELECTRODE REM PT RTRN 9FT ADLT (ELECTROSURGICAL) ×1 IMPLANT
GAUZE SPONGE 4X4 16PLY XRAY LF (GAUZE/BANDAGES/DRESSINGS) ×2 IMPLANT
GLOVE BIOGEL M STRL SZ7.5 (GLOVE) ×2 IMPLANT
GLOVE SKINSENSE NS SZ7.0 (GLOVE) ×2
GLOVE SKINSENSE STRL SZ7.0 (GLOVE) IMPLANT
GOWN STRL REIN XL XLG (GOWN DISPOSABLE) ×3 IMPLANT
NEEDLE HYPO 22GX1.5 SAFETY (NEEDLE) ×1 IMPLANT
NS IRRIG 500ML POUR BTL (IV SOLUTION) ×2 IMPLANT
PACK BASIN DAY SURGERY FS (CUSTOM PROCEDURE TRAY) ×2 IMPLANT
SPONGE LAP 4X18 X RAY DECT (DISPOSABLE) ×4 IMPLANT
SUPPORT SCROTAL LG STRP (MISCELLANEOUS) ×1 IMPLANT
SUT CHROMIC 3 0 SH 27 (SUTURE) ×3 IMPLANT
SUT VIC AB 2-0 SH 27 (SUTURE) ×2
SUT VIC AB 2-0 SH 27XBRD (SUTURE) IMPLANT
SYR CONTROL 10ML LL (SYRINGE) ×1 IMPLANT
TRAY DSU PREP LF (CUSTOM PROCEDURE TRAY) ×2 IMPLANT
TUBE CONNECTING 12X1/4 (SUCTIONS) ×2 IMPLANT
YANKAUER SUCT BULB TIP NO VENT (SUCTIONS) ×2 IMPLANT

## 2012-04-24 NOTE — Interval H&P Note (Signed)
History and Physical Interval Note:  04/24/2012 8:27 AM  Jonathon [name]  has presented today for surgery, with the diagnosis of Left Hydrocele  The various methods of treatment have been discussed with the patient and family. After consideration of risks, benefits and other options for treatment, the patient has consented to  Procedure(s) with comments: HYDROCELECTOMY ADULT (Left) - 90 mins req for this case    as a surgical intervention . I discussed with the patient  And his wife the side effects of the proposed treatment, the likelihood of the patient achieving the goals of the procedure, and any potential problems that might occur during the procedure or recuperation. The patient's history has been reviewed, patient examined, no change in status, stable for surgery.  I have reviewed the patient's chart and labs.  Questions were answered to the patient's satisfaction.     Antony Haste

## 2012-04-24 NOTE — Anesthesia Procedure Notes (Addendum)
Spinal  Patient location during procedure: OR Start time: 04/24/2012 8:58 AM End time: 04/24/2012 9:01 AM Staffing Anesthesiologist: Lewie Loron R Performed by: anesthesiologist  Preanesthetic Checklist Completed: patient identified, site marked, surgical consent, pre-op evaluation, timeout performed, IV checked, risks and benefits discussed and monitors and equipment checked Spinal Block Patient position: sitting Prep: ChloraPrep Patient monitoring: heart rate, continuous pulse ox and blood pressure Approach: midline Location: L3-4 Injection technique: single-shot Needle Needle type: Quincke  Needle gauge: 22 G Needle length: 9 cm Assessment Sensory level: T8 Additional Notes Expiration date of kit checked and confirmed. Patient tolerated procedure well, without complications.    Procedure Name: MAC Date/Time: 04/24/2012 8:55 AM Performed by: Fran Lowes Pre-anesthesia Checklist: Patient identified, Timeout performed, Emergency Drugs available, Suction available and Patient being monitored Oxygen Delivery Method: Simple face mask

## 2012-04-24 NOTE — Transfer of Care (Signed)
  Immediate Anesthesia Transfer of Care Note  Patient: Jonathon [name]  Procedure(s) Performed: Procedure(s) (LRB): HYDROCELECTOMY ADULT (Left)  Patient Location: PACU  Anesthesia Type: General  Level of Consciousness: awake, sedated, patient cooperative and responds to stimulation  Airway & Oxygen Therapy: Patient Spontanous Breathing and Patient connected to face mask oxygen  Post-op Assessment: Report given to PACU RN, Post -op Vital signs reviewed and stable and Patient moving all extremities  Post vital signs: Reviewed and stable  Complications: No apparent anesthesia complications

## 2012-04-24 NOTE — Op Note (Signed)
Preoperative diagnosis: Left hydrocele Postoperative diagnosis: Left hydrocele  Procedure: Left hydrocelectomy  Surgeon: Mena Goes  Type of anesthesia: Spinal  Findings: Left hydrocele with normal testicle on palpation  Description of procedure: After consent was obtained patient brought the operating room. After adequate anesthesia the external genitalia were prepped and draped in the usual sterile fashion. A timeout was performed to confirm the patient and procedure. The left scrotum was infiltrated for local anesthetic and a transverse incision was made through the skin. The Bovie was used to dissect off the darkest fascia from the hydrocele sac which was then delivered through the incision. The hydrocele was opened and 500 mL of clear straw fluid was drained. The testicle was delivered and appeared normal. The testicle was palpably normal without mass. Interestingly the hydrocele followed another channel to a posterior hydrocele collection behind the testicle. Both hydrocele sacs appear to be blind-ending without connection proximally.  The ostia between the anterior and posterior hydrocele was widely opened being careful to avoid any cord structures. Excess hydrocele sac was removed and the remaining hydrocele sac was everted behind the cord and closed upon itself with a running 3-0 chromic suture obliterating any dead space. The excess sac was loosely reapproximated there was no tension or constriction on the cord. Adequate hemostasis was insured a Penrose drain was placed through separate stab incision more inferior and anterior left scrotum. It was secured to the skin with a 3-0 chromic suture.  The testicle was irrigated and placed back in the scrotum without torsion. The darkest fascia was run closed with 2-0 Vicryl suture. The skin was closed with interrupted vertical mattress sutures of 3-0 chromic.  The incision was cleaned and a sterile dressing of fluffs and a jock strap were placed.  Patient was awakened and taken to the recovery room in stable condition.  Blood loss: Minimal  Complications: None  Drains: Penrose  Specimens: Hydrocele sac to pathology.

## 2012-04-24 NOTE — Anesthesia Preprocedure Evaluation (Addendum)
Anesthesia Evaluation  Patient identified by MRN, date of birth, ID band Patient awake    Reviewed: Allergy & Precautions, H&P , NPO status , Patient's Chart, lab work & pertinent test results, reviewed documented beta blocker date and time   Airway Mallampati: II TM Distance: >3 FB Neck ROM: Full    Dental  (+) Dental Advisory Given   Pulmonary neg pulmonary ROS, shortness of breath and with exertion, COPDCurrent Smoker,    + decreased breath sounds      Cardiovascular hypertension, Pt. on medications and Pt. on home beta blockers + CAD Rhythm:Regular Rate:Normal  Echo 01/2011 - Left ventricle: The cavity size was normal. Wall thickness  was increased in a pattern of moderate LVH. Systolic  function was normal. The estimated ejection fraction was  in the range of 55% to 60%. Wall motion was normal; there  were no regional wall motion abnormalities. Doppler  parameters are consistent with abnormal left ventricular  relaxation (grade 1 diastolic dysfunction). - Left atrium: The atrium was mildly dilated.  Impressions: - No cardiac source of emboli was indentified.   CAD Non obstructive CAD (~40% stenosis) noted on Cath 04/2011.    Neuro/Psych CVA negative psych ROS   GI/Hepatic negative GI ROS, Neg liver ROS,   Endo/Other  negative endocrine ROS  Renal/GU negative Renal ROS     Musculoskeletal negative musculoskeletal ROS (+)   Abdominal   Peds  Hematology negative hematology ROS (+)   Anesthesia Other Findings   Reproductive/Obstetrics                      Anesthesia Physical Anesthesia Plan  ASA: III  Anesthesia Plan: Spinal   Post-op Pain Management:    Induction: Intravenous  Airway Management Planned:   Additional Equipment:   Intra-op Plan:   Post-operative Plan:   Informed Consent: I have reviewed the patients History and Physical, chart, labs and discussed the procedure  including the risks, benefits and alternatives for the proposed anesthesia with the patient or authorized representative who has indicated his/her understanding and acceptance.   Dental advisory given  Plan Discussed with: CRNA  Anesthesia Plan Comments: (Pt non-compliant with pulmonology recs/COPD regimen. Severe COPD. Had a long discussion about pulmonary complications with general anesthesia in unmanaged severe COPD. Plan spinal and pt agrees.)      Anesthesia Quick Evaluation

## 2012-04-24 NOTE — Anesthesia Postprocedure Evaluation (Signed)
Anesthesia Post Note  Patient: Jonathon [name]  Procedure(s) Performed: Procedure(s) (LRB): HYDROCELECTOMY ADULT (Left)  Anesthesia type: Spinal  Patient location: PACU  Post pain: Pain level controlled  Post assessment: Post-op Vital signs reviewed  Last Vitals: BP 147/106  Pulse 52  Temp(Src) 36.3 C (Oral)  Resp 16  Ht 6' (1.829 m)  Wt 214 lb 5 oz (97.212 kg)  BMI 29.06 kg/m2  SpO2 100%  Post vital signs: Reviewed  Level of consciousness: sedated  Complications: No apparent anesthesia complications

## 2012-04-25 ENCOUNTER — Encounter (HOSPITAL_BASED_OUTPATIENT_CLINIC_OR_DEPARTMENT_OTHER): Payer: Self-pay | Admitting: Urology

## 2012-09-04 ENCOUNTER — Encounter (HOSPITAL_COMMUNITY)
Admission: RE | Disposition: A | Discharge status: Home / Self Care | Payer: Self-pay | Source: Ambulatory Visit | Attending: Cardiology

## 2012-09-04 ENCOUNTER — Ambulatory Visit (HOSPITAL_COMMUNITY)
Admission: RE | Admit: 2012-09-04 | Discharge: 2012-09-04 | Disposition: A | Discharge status: Home / Self Care | Payer: Medicaid Other | Source: Ambulatory Visit | Attending: Cardiology | Admitting: Cardiology

## 2012-09-04 DIAGNOSIS — E669 Obesity, unspecified: Secondary | ICD-10-CM | POA: Insufficient documentation

## 2012-09-04 DIAGNOSIS — I69959 Hemiplegia and hemiparesis following unspecified cerebrovascular disease affecting unspecified side: Secondary | ICD-10-CM | POA: Insufficient documentation

## 2012-09-04 DIAGNOSIS — I2789 Other specified pulmonary heart diseases: Secondary | ICD-10-CM | POA: Insufficient documentation

## 2012-09-04 DIAGNOSIS — J449 Chronic obstructive pulmonary disease, unspecified: Secondary | ICD-10-CM | POA: Insufficient documentation

## 2012-09-04 DIAGNOSIS — Z79899 Other long term (current) drug therapy: Secondary | ICD-10-CM | POA: Insufficient documentation

## 2012-09-04 DIAGNOSIS — IMO0002 Reserved for concepts with insufficient information to code with codable children: Secondary | ICD-10-CM | POA: Insufficient documentation

## 2012-09-04 DIAGNOSIS — I2510 Atherosclerotic heart disease of native coronary artery without angina pectoris: Secondary | ICD-10-CM | POA: Insufficient documentation

## 2012-09-04 DIAGNOSIS — K219 Gastro-esophageal reflux disease without esophagitis: Secondary | ICD-10-CM | POA: Insufficient documentation

## 2012-09-04 DIAGNOSIS — J4489 Other specified chronic obstructive pulmonary disease: Secondary | ICD-10-CM | POA: Insufficient documentation

## 2012-09-04 DIAGNOSIS — F17200 Nicotine dependence, unspecified, uncomplicated: Secondary | ICD-10-CM | POA: Insufficient documentation

## 2012-09-04 DIAGNOSIS — E785 Hyperlipidemia, unspecified: Secondary | ICD-10-CM | POA: Insufficient documentation

## 2012-09-04 DIAGNOSIS — Z7982 Long term (current) use of aspirin: Secondary | ICD-10-CM | POA: Insufficient documentation

## 2012-09-04 DIAGNOSIS — I10 Essential (primary) hypertension: Secondary | ICD-10-CM | POA: Insufficient documentation

## 2012-09-04 HISTORY — PX: ABDOMINAL ANGIOGRAM: SHX5499

## 2012-09-04 HISTORY — PX: LEFT AND RIGHT HEART CATHETERIZATION WITH CORONARY ANGIOGRAM: SHX5449

## 2012-09-04 LAB — POCT I-STAT 3, ART BLOOD GAS (G3+)
Acid-Base Excess: 2 mmol/L (ref 0.0–2.0)
Bicarbonate: 27.5 mEq/L — ABNORMAL HIGH (ref 20.0–24.0)
O2 Saturation: 92 %
TCO2: 29 mmol/L (ref 0–100)
pCO2 arterial: 46.2 mmHg — ABNORMAL HIGH (ref 35.0–45.0)
pH, Arterial: 7.383 (ref 7.350–7.450)
pO2, Arterial: 67 mmHg — ABNORMAL LOW (ref 80.0–100.0)

## 2012-09-04 LAB — POCT I-STAT 3, VENOUS BLOOD GAS (G3P V)
Acid-Base Excess: 2 mmol/L (ref 0.0–2.0)
Bicarbonate: 27.1 mEq/L — ABNORMAL HIGH (ref 20.0–24.0)
Bicarbonate: 29 mEq/L — ABNORMAL HIGH (ref 20.0–24.0)
O2 Saturation: 65 %
O2 Saturation: 70 %
TCO2: 29 mmol/L (ref 0–100)
TCO2: 30 mmol/L (ref 0–100)
pCO2, Ven: 51.1 mmHg — ABNORMAL HIGH (ref 45.0–50.0)
pCO2, Ven: 51.2 mmHg — ABNORMAL HIGH (ref 45.0–50.0)
pH, Ven: 7.333 — ABNORMAL HIGH (ref 7.250–7.300)
pH, Ven: 7.361 — ABNORMAL HIGH (ref 7.250–7.300)
pO2, Ven: 37 mmHg (ref 30.0–45.0)
pO2, Ven: 39 mmHg (ref 30.0–45.0)

## 2012-09-04 SURGERY — LEFT AND RIGHT HEART CATHETERIZATION WITH CORONARY ANGIOGRAM
Anesthesia: LOCAL

## 2012-09-04 MED ORDER — MIDAZOLAM HCL 2 MG/2ML IJ SOLN
INTRAMUSCULAR | Status: AC
  Filled 2012-09-04: qty 2

## 2012-09-04 MED ORDER — SODIUM CHLORIDE 0.9 % IV SOLN: 250.0000 mL | INTRAVENOUS | Status: DC | PRN

## 2012-09-04 MED ORDER — HEPARIN (PORCINE) IN NACL 2-0.9 UNIT/ML-% IJ SOLN
INTRAMUSCULAR | Status: AC
  Filled 2012-09-04: qty 1000

## 2012-09-04 MED ORDER — METOPROLOL TARTRATE 1 MG/ML IV SOLN
INTRAVENOUS | Status: AC
  Filled 2012-09-04: qty 5

## 2012-09-04 MED ORDER — SODIUM CHLORIDE 0.9 % IJ SOLN: 3.0000 mL | Freq: Two times a day (BID) | INTRAMUSCULAR | Status: DC

## 2012-09-04 MED ORDER — HYDROMORPHONE HCL PF 2 MG/ML IJ SOLN
INTRAMUSCULAR | Status: AC
  Filled 2012-09-04: qty 1

## 2012-09-04 MED ORDER — SODIUM CHLORIDE 0.9 % IJ SOLN: 3.0000 mL | INTRAMUSCULAR | Status: DC | PRN

## 2012-09-04 MED ORDER — ASPIRIN 81 MG PO CHEW
324.0000 mg | CHEWABLE_TABLET | ORAL | Status: AC
  Administered 2012-09-04: 324 mg via ORAL
  Filled 2012-09-04: qty 4

## 2012-09-04 MED ORDER — NITROGLYCERIN 0.2 MG/ML ON CALL CATH LAB
INTRAVENOUS | Status: AC
  Filled 2012-09-04: qty 1

## 2012-09-04 MED ORDER — ACETAMINOPHEN 325 MG PO TABS: 650.0000 mg | ORAL_TABLET | ORAL | Status: DC | PRN

## 2012-09-04 MED ORDER — SODIUM CHLORIDE 0.9 % IV SOLN
INTRAVENOUS | Status: DC
  Administered 2012-09-04: 09:00:00 via INTRAVENOUS

## 2012-09-04 MED ORDER — SODIUM CHLORIDE 0.9 % IV SOLN: 1.0000 mL/kg/h | INTRAVENOUS | Status: DC

## 2012-09-04 MED ORDER — ONDANSETRON HCL 4 MG/2ML IJ SOLN: 4.0000 mg | Freq: Four times a day (QID) | INTRAMUSCULAR | Status: DC | PRN

## 2012-09-04 MED ORDER — LIDOCAINE HCL (PF) 1 % IJ SOLN
INTRAMUSCULAR | Status: AC
  Filled 2012-09-04: qty 30

## 2012-09-04 MED ORDER — HYDRALAZINE HCL 20 MG/ML IJ SOLN
INTRAMUSCULAR | Status: AC
  Filled 2012-09-04: qty 1

## 2012-09-04 NOTE — H&P (Signed)
  Please see office visit notes for complete details of HPI.  

## 2012-09-04 NOTE — CV Procedure (Signed)
Procedures performed: Femoral access Left heart catheterization including left ventriculography, selective right and left coronary arteriography. Right heart catheterization and calculation of cardiac output and cardiac index by Fick.. Abdominal aortogram. Abdominal aortogram.  Indication: Dyspnea, HTN, hyperlipidemia, prior history of stroke with residual defect, COPD, obesity, patient presenting with worsening symptoms, has multiple cardiovascular risk factors and CT scan of the chest and with significant coronary calcification. For definitive diagnosis of coronary artery disease and the severity of coronary artery disease he was brought to the cardiac catheterization lab to evaluate his coronary anatomy. Abdominal aortogram was performed the valve for renal artery stenosis as he had uncontrolled hypertension. His blood pressure on presentation was 200/105 mm mercury.  HEMODYNAMIC DATA: The left ventricle pressure was 191/11 with  EDP23. Aortic pressure was  195/105 with a mean of 141 mmHg. There was no pressure gradient across the aortic valve   Right coronary artery: Right coronary is a large caliber vessel and a dominant vessel. It is tortuous. There is a very smooth long 2030% stenosis in the midsegment..   Left main coronary artery: Left main coronary artery is a large caliber vessel, which is smooth and normal. It is short.   Circumflex: Circumflex is a moderate caliber vessel, giving origin to a small obtuse marginal 1. There is very mild luminal irregularity in the mid circumflex constituting 20-30% stenosis.  Ramus intermediate: NA.   LAD: LAD is a large caliber vessel, giving origin to 2 moderate-sized D1 and D2 from the midsegment and several small diagonals. At the bifurcation of the D2, the D2 has mild 20% mild luminal irregularity and the LAD also shows very minimal luminal irregularity at this location. Mid to distal segment of the LAD shows very mild luminal irregularity.  Tortuosity evident in the LAD.Marland Kitchen   Left ventriculogram: Left ventriculography revealed ejection fraction of 55-60%. There was no wall motion abnormality.   Right heart catheterization:  RA Pressure 9/1 Mean 1.mm Hg. RA Saturation 70 RV: 39/0 EDP 12 PA: 45/23 with mean of 21 mm Hg. PCW: 19/19 with mean of 17 mm Hg. Aortic saturation:  92 Cardiac Output: 5.75, Cardiac Index: 2.73 by FICK method.   Peripheral arthrogram: No evidence of abdominal aneurysm. 2 renal arteries one on either side and they're widely patent. There is tortuosity of the abdominal aorta. No significant atherosclerotic changes.  IMPRESSIONS:  1. No significant coronary artery disease, right dominant circulation. LVEF: 55-60 2. Right heart cath revealing: Moderate pulmonary hypertension with preserved cardiac output and cardiac index  TECHNIQUE PROCEDURE: Under sterile precautions using a 5-French right femoral access and a 7-French right femoral vein access, a balloon-tip Swan-Ganz catheter was advanced into the right atrium, right ventricle and then into the pulmonary capillary wedge position via right femoral venous access. Right-sided hemodynamics the carefully performed and the data was carefully analyzed. The catheter was then pulled out of the body.  A 5 [place] multipurpose B2 catheter was advanced via right femoral arterial access. The catheter was advanced into the left ventricle and left ventriculography was performed in the RAO projection. The catheter was then pulled into the ascending aorta and selective right and left coronary arteriogram was performed. Abdominal aortogram was performed in the AP projection with hand contrast injection.  The cath was then pulled out of the body. No immediate complications.

## 2012-09-04 NOTE — Interval H&P Note (Signed)
History and Physical Interval Note:  09/04/2012 10:02 AM  Jonathon [name]  has presented today for surgery, with the diagnosis of CAD  The various methods of treatment have been discussed with the patient and family. After consideration of risks, benefits and other options for treatment, the patient has consented to  Procedure(s): LEFT AND RIGHT HEART CATHETERIZATION WITH CORONARY ANGIOGRAM ([name]) and possible PTCA as a surgical intervention .  The patient's history has been reviewed, patient examined, no change in status, stable for surgery.  I have reviewed the patient's chart and labs.  Questions were answered to the patient's satisfaction.     Pamella Pert

## 2013-09-22 ENCOUNTER — Emergency Department (HOSPITAL_COMMUNITY): Payer: Medicare Other

## 2013-09-22 ENCOUNTER — Encounter (HOSPITAL_COMMUNITY): Payer: Self-pay | Admitting: Emergency Medicine

## 2013-09-22 ENCOUNTER — Emergency Department (HOSPITAL_COMMUNITY)
Admission: EM | Admit: 2013-09-22 | Discharge: 2013-09-22 | Disposition: A | Discharge status: Home / Self Care | Payer: Medicare Other | Attending: Emergency Medicine | Admitting: Emergency Medicine

## 2013-09-22 DIAGNOSIS — Z8673 Personal history of transient ischemic attack (TIA), and cerebral infarction without residual deficits: Secondary | ICD-10-CM | POA: Insufficient documentation

## 2013-09-22 DIAGNOSIS — E119 Type 2 diabetes mellitus without complications: Secondary | ICD-10-CM | POA: Diagnosis not present

## 2013-09-22 DIAGNOSIS — R0789 Other chest pain: Secondary | ICD-10-CM

## 2013-09-22 DIAGNOSIS — R071 Chest pain on breathing: Secondary | ICD-10-CM | POA: Insufficient documentation

## 2013-09-22 DIAGNOSIS — Z7982 Long term (current) use of aspirin: Secondary | ICD-10-CM | POA: Insufficient documentation

## 2013-09-22 DIAGNOSIS — E785 Hyperlipidemia, unspecified: Secondary | ICD-10-CM | POA: Diagnosis not present

## 2013-09-22 DIAGNOSIS — Z79899 Other long term (current) drug therapy: Secondary | ICD-10-CM | POA: Insufficient documentation

## 2013-09-22 DIAGNOSIS — I10 Essential (primary) hypertension: Secondary | ICD-10-CM | POA: Diagnosis not present

## 2013-09-22 DIAGNOSIS — F17200 Nicotine dependence, unspecified, uncomplicated: Secondary | ICD-10-CM | POA: Insufficient documentation

## 2013-09-22 DIAGNOSIS — R079 Chest pain, unspecified: Secondary | ICD-10-CM | POA: Insufficient documentation

## 2013-09-22 DIAGNOSIS — J449 Chronic obstructive pulmonary disease, unspecified: Secondary | ICD-10-CM | POA: Insufficient documentation

## 2013-09-22 DIAGNOSIS — I509 Heart failure, unspecified: Secondary | ICD-10-CM | POA: Diagnosis not present

## 2013-09-22 DIAGNOSIS — J4489 Other specified chronic obstructive pulmonary disease: Secondary | ICD-10-CM | POA: Insufficient documentation

## 2013-09-22 LAB — I-STAT TROPONIN, ED
Troponin i, poc: 0 ng/mL (ref 0.00–0.08)
Troponin i, poc: 0 ng/mL (ref 0.00–0.08)

## 2013-09-22 LAB — BASIC METABOLIC PANEL
Anion gap: 14 (ref 5–15)
BUN: 16 mg/dL (ref 6–23)
CO2: 25 mEq/L (ref 19–32)
Calcium: 9.2 mg/dL (ref 8.4–10.5)
Chloride: 100 mEq/L (ref 96–112)
Creatinine, Ser: 1.23 mg/dL (ref 0.50–1.35)
GFR calc Af Amer: 70 mL/min — ABNORMAL LOW (ref >90)
GFR calc non Af Amer: 61 mL/min — ABNORMAL LOW (ref >90)
Glucose, Bld: 132 mg/dL — ABNORMAL HIGH (ref 70–99)
Potassium: 4 mEq/L (ref 3.7–5.3)
Sodium: 139 mEq/L (ref 137–147)

## 2013-09-22 LAB — CBC WITH DIFFERENTIAL/PLATELET
Basophils Absolute: 0 10*3/uL (ref 0.0–0.1)
Basophils Relative: 0 % (ref 0–1)
Eosinophils Absolute: 0.2 10*3/uL (ref 0.0–0.7)
Eosinophils Relative: 2 % (ref 0–5)
HCT: 44.1 % (ref 39.0–52.0)
Hemoglobin: 15.3 g/dL (ref 13.0–17.0)
Lymphocytes Relative: 36 % (ref 12–46)
Lymphs Abs: 3.4 10*3/uL (ref 0.7–4.0)
MCH: 30 pg (ref 26.0–34.0)
MCHC: 34.7 g/dL (ref 30.0–36.0)
MCV: 86.5 fL (ref 78.0–100.0)
Monocytes Absolute: 1.1 10*3/uL — ABNORMAL HIGH (ref 0.1–1.0)
Monocytes Relative: 12 % (ref 3–12)
Neutro Abs: 4.7 10*3/uL (ref 1.7–7.7)
Neutrophils Relative %: 50 % (ref 43–77)
Platelets: 217 10*3/uL (ref 150–400)
RBC: 5.1 MIL/uL (ref 4.22–5.81)
RDW: 14.3 % (ref 11.5–15.5)
WBC: 9.4 10*3/uL (ref 4.0–10.5)

## 2013-09-22 MED ORDER — HYDROCODONE-ACETAMINOPHEN 5-325 MG PO TABS
2.0000 | ORAL_TABLET | Freq: Once | ORAL | Status: AC
  Administered 2013-09-22: 2 via ORAL
  Filled 2013-09-22: qty 2

## 2013-09-22 MED ORDER — IBUPROFEN 800 MG PO TABS
800.0000 mg | ORAL_TABLET | Freq: Once | ORAL | Status: AC
  Administered 2013-09-22: 800 mg via ORAL
  Filled 2013-09-22: qty 1

## 2013-09-22 MED ORDER — HYDROCODONE-ACETAMINOPHEN 5-325 MG PO TABS: 1.0000 | ORAL_TABLET | ORAL | Status: DC | PRN

## 2013-09-22 NOTE — ED Notes (Signed)
Pt requesting ibuprofen or Tylenol for pain. Dr. Leonides Schanz notified.

## 2013-09-22 NOTE — Discharge Instructions (Signed)

## 2013-09-22 NOTE — ED Notes (Signed)
Pt c/o pain in his rt chest when he moves his arm  edp notified

## 2013-09-22 NOTE — ED Provider Notes (Signed)
TIME SEEN: 5:17 PM  CHIEF COMPLAINT: Right-sided chest pain  HPI: Patient is a 63 year old male with history of hypertension, diabetes, hyperlipidemia, tobacco use, prior CVA, prior right-sided pneumothorax in 2010 in [place] that required a VATS? Who presents to the emergency Department right-sided chest pain that started 3 days ago. It has been intermittent. It hurts worse with movement, coughing and sneezing. He states he has had shortness of breath because it hurts to take a deep breath. No nausea, vomiting, diarrhea. No diaphoresis or dizziness. No fever. He has had a mild dry cough. No lower extremity swelling or pain. History of PE or DVT. No history of coronary artery disease.  ROS: See HPI Constitutional: no fever  Eyes: no drainage  ENT: no runny nose   Cardiovascular:   chest pain  Resp:  SOB  GI: no vomiting GU: no dysuria Integumentary: no rash  Allergy: no hives  Musculoskeletal: no leg swelling  Neurological: no slurred speech ROS otherwise negative  PAST MEDICAL HISTORY/PAST SURGICAL HISTORY:  Past Medical History  Diagnosis Date  . Hypertension   . Smoker   . History of CVA (cerebrovascular accident) 02-22-2011    LEFT BRAIN STEM PONTINE HEMORRHAGE--  RIGHT SIDED WEAKNESS  . History of CHF (congestive heart failure) MONITORED BY DR BOUSKA    DIASTOLIC  . Hydrocele, left   . Chronic cough   . Short of breath on exertion   . Harsh voice quality     PT STATES NORMAL FOR HIM  . COPD, severe     PT DENIES REGULAR DAILY INHALER ANY PRN  . Pre-operative clearance     GIVEN BY DR Coletta Memos  . Weakness of right side of body     S/P CVA   FEB 2013    MEDICATIONS:  Prior to Admission medications   Medication Sig Start Date End Date Taking? Authorizing Provider  albuterol (PROVENTIL HFA;VENTOLIN HFA) 108 (90 BASE) MCG/ACT inhaler Inhale 2 puffs into the lungs every 6 (six) hours as needed (for copd).     Historical Provider, MD  aspirin EC 325 MG tablet Take 325 mg by  mouth daily.    Historical Provider, MD  atorvastatin (LIPITOR) 10 MG tablet Take 5 mg by mouth daily.    Historical Provider, MD  cloNIDine (CATAPRES) 0.3 MG tablet Take 0.3 mg by mouth 2 (two) times daily.    Historical Provider, MD  gabapentin (NEURONTIN) 400 MG capsule Take 800 mg by mouth 3 (three) times daily.    Historical Provider, MD  hydrochlorothiazide (HYDRODIURIL) 25 MG tablet Take 25 mg by mouth daily.    Historical Provider, MD  lisinopril (PRINIVIL,ZESTRIL) 20 MG tablet Take 20 mg by mouth 2 (two) times daily.    Historical Provider, MD  metoprolol succinate (TOPROL-XL) 100 MG 24 hr tablet Take 50 mg by mouth daily. Take with or immediately following a meal.    Historical Provider, MD  nortriptyline (PAMELOR) 25 MG capsule Take 25 mg by mouth at bedtime.    Historical Provider, MD  vitamin B-12 (CYANOCOBALAMIN) 500 MCG tablet Take 500 mcg by mouth daily.    Historical Provider, MD    ALLERGIES:  No Known Allergies  SOCIAL HISTORY:  History  Substance Use Topics  . Smoking status: Current Every Day Smoker -- 1.00 packs/day for 48 years    Types: Cigarettes  . Smokeless tobacco: Never Used  . Alcohol Use: Yes     Comment: OCCASIONAL--      FAMILY HISTORY: Family History  Problem Relation Age of Onset  . Hypertension      EXAM: BP 132/70  Temp(Src) 98.4 F (36.9 C) (Oral)  Resp 18  SpO2 99% CONSTITUTIONAL: Alert and oriented and responds appropriately to questions. Well-appearing; well-nourished HEAD: Normocephalic EYES: Conjunctivae clear, PERRL ENT: normal nose; no rhinorrhea; moist mucous membranes; pharynx without lesions noted NECK: Supple, no meningismus, no LAD  CARD: RRR; S1 and S2 appreciated; no murmurs, no clicks, no rubs, no gallops RESP: Normal chest excursion without splinting or tachypnea; breath sounds clear and equal bilaterally; no wheezes, no rhonchi, no rales, large surgical scar on his posterior right flank that is well-healed, patient  appears to have diminished breath sounds at bilateral apices, no crepitus, chest wall is nontender to palpation over the right axillary region without ecchymosis or deformity or lesion ABD/GI: Normal bowel sounds; non-distended; soft, non-tender, no rebound, no guarding BACK:  The back appears normal and is non-tender to palpation, there is no CVA tenderness EXT: Normal ROM in all joints; non-tender to palpation; no edema; normal capillary refill; no cyanosis    SKIN: Normal color for age and race; warm NEURO: Moves all extremities equally PSYCH: The patient's mood and manner are appropriate. Grooming and personal hygiene are appropriate.  MEDICAL DECISION MAKING: Patient here with complaints of right-sided chest pain. He has had a prior pneumothorax. Less likely ACS given its atypical nature but he does have multiple risk factors. EKG shows no ischemic changes or arrhythmia. Troponin negative. Chest x-ray pending. Doubt DVT as he has no risk factors other than age. He is not hypoxic or tachycardic. States pain is worse with movement.  ED PROGRESS: Chest x-ray shows severe bullous emphysema that is unchanged. No pneumothorax. No infiltrate or edema. Patient is now requesting ibuprofen for pain. We'll repeat troponin but anticipate discharge home.   Second troponin negative. We'll discharge home. Patient does report that he has been working out and using weights and and this may be the cause of his pain. I do think this is musculoskeletal in nature but have discussed with him he needs close outpatient followup. Have discussed strict return precautions. He verbalizes understanding and is comfortable with plan.   EKG Interpretation  Date/Time:  Sunday September 22 2013 16:38:20 EDT Ventricular Rate:  70 PR Interval:  160 QRS Duration: 96 QT Interval:  412 QTC Calculation: 444 R Axis:   -8 Text Interpretation:  Normal sinus rhythm Normal ECG Confirmed by Marcus Schwandt,  DO, Shyheim Tanney ([id number]) on 09/22/2013  5:17:14 PM         Sylvia, DO 09/22/13 2131

## 2013-09-22 NOTE — ED Notes (Signed)
C/o chest pain that is worse with cough time 3 days. No nausea or vomiting or shob. Wife at bedside

## 2013-09-22 NOTE — ED Notes (Signed)
Patient transported to X-ray 

## 2013-09-22 NOTE — ED Notes (Signed)
The pt and his wife are eating food from outside ,  He reports that he has minimal pain and he feels much better

## 2013-09-22 NOTE — ED Notes (Signed)
Pain med given 

## 2013-09-22 NOTE — ED Notes (Signed)
Med given no distress

## 2013-09-27 ENCOUNTER — Encounter (HOSPITAL_COMMUNITY): Payer: Self-pay | Admitting: Emergency Medicine

## 2013-09-27 ENCOUNTER — Emergency Department (HOSPITAL_COMMUNITY): Payer: Medicare Other

## 2013-09-27 ENCOUNTER — Observation Stay (HOSPITAL_COMMUNITY)
Admission: EM | Admit: 2013-09-27 | Discharge: 2013-09-29 | Disposition: A | Discharge status: Home / Self Care | Payer: Medicare Other | Attending: Internal Medicine | Admitting: Internal Medicine

## 2013-09-27 DIAGNOSIS — I509 Heart failure, unspecified: Secondary | ICD-10-CM | POA: Insufficient documentation

## 2013-09-27 DIAGNOSIS — K449 Diaphragmatic hernia without obstruction or gangrene: Secondary | ICD-10-CM | POA: Insufficient documentation

## 2013-09-27 DIAGNOSIS — I152 Hypertension secondary to endocrine disorders: Secondary | ICD-10-CM | POA: Diagnosis present

## 2013-09-27 DIAGNOSIS — Z23 Encounter for immunization: Secondary | ICD-10-CM | POA: Insufficient documentation

## 2013-09-27 DIAGNOSIS — Z8673 Personal history of transient ischemic attack (TIA), and cerebral infarction without residual deficits: Secondary | ICD-10-CM | POA: Insufficient documentation

## 2013-09-27 DIAGNOSIS — I5030 Unspecified diastolic (congestive) heart failure: Secondary | ICD-10-CM | POA: Insufficient documentation

## 2013-09-27 DIAGNOSIS — R0789 Other chest pain: Secondary | ICD-10-CM | POA: Diagnosis present

## 2013-09-27 DIAGNOSIS — I619 Nontraumatic intracerebral hemorrhage, unspecified: Secondary | ICD-10-CM

## 2013-09-27 DIAGNOSIS — Z902 Acquired absence of lung [part of]: Secondary | ICD-10-CM | POA: Insufficient documentation

## 2013-09-27 DIAGNOSIS — E876 Hypokalemia: Secondary | ICD-10-CM

## 2013-09-27 DIAGNOSIS — I10 Essential (primary) hypertension: Secondary | ICD-10-CM | POA: Diagnosis not present

## 2013-09-27 DIAGNOSIS — J42 Unspecified chronic bronchitis: Secondary | ICD-10-CM

## 2013-09-27 DIAGNOSIS — F17200 Nicotine dependence, unspecified, uncomplicated: Secondary | ICD-10-CM | POA: Diagnosis not present

## 2013-09-27 DIAGNOSIS — I613 Nontraumatic intracerebral hemorrhage in brain stem: Secondary | ICD-10-CM

## 2013-09-27 DIAGNOSIS — J4489 Other specified chronic obstructive pulmonary disease: Secondary | ICD-10-CM | POA: Insufficient documentation

## 2013-09-27 DIAGNOSIS — E1159 Type 2 diabetes mellitus with other circulatory complications: Secondary | ICD-10-CM | POA: Diagnosis present

## 2013-09-27 DIAGNOSIS — Z720 Tobacco use: Secondary | ICD-10-CM

## 2013-09-27 DIAGNOSIS — R079 Chest pain, unspecified: Secondary | ICD-10-CM

## 2013-09-27 DIAGNOSIS — Z79899 Other long term (current) drug therapy: Secondary | ICD-10-CM | POA: Diagnosis not present

## 2013-09-27 DIAGNOSIS — Z7982 Long term (current) use of aspirin: Secondary | ICD-10-CM | POA: Diagnosis not present

## 2013-09-27 DIAGNOSIS — R918 Other nonspecific abnormal finding of lung field: Secondary | ICD-10-CM

## 2013-09-27 DIAGNOSIS — J449 Chronic obstructive pulmonary disease, unspecified: Secondary | ICD-10-CM

## 2013-09-27 DIAGNOSIS — E119 Type 2 diabetes mellitus without complications: Secondary | ICD-10-CM

## 2013-09-27 DIAGNOSIS — R071 Chest pain on breathing: Secondary | ICD-10-CM

## 2013-09-27 LAB — CBC WITH DIFFERENTIAL/PLATELET
Basophils Absolute: 0 10*3/uL (ref 0.0–0.1)
Basophils Relative: 0 % (ref 0–1)
Eosinophils Absolute: 0.2 10*3/uL (ref 0.0–0.7)
Eosinophils Relative: 2 % (ref 0–5)
HCT: 44.9 % (ref 39.0–52.0)
Hemoglobin: 15.4 g/dL (ref 13.0–17.0)
Lymphocytes Relative: 36 % (ref 12–46)
Lymphs Abs: 3.4 10*3/uL (ref 0.7–4.0)
MCH: 30 pg (ref 26.0–34.0)
MCHC: 34.3 g/dL (ref 30.0–36.0)
MCV: 87.4 fL (ref 78.0–100.0)
Monocytes Absolute: 0.9 10*3/uL (ref 0.1–1.0)
Monocytes Relative: 9 % (ref 3–12)
Neutro Abs: 4.8 10*3/uL (ref 1.7–7.7)
Neutrophils Relative %: 53 % (ref 43–77)
Platelets: 222 10*3/uL (ref 150–400)
RBC: 5.14 MIL/uL (ref 4.22–5.81)
RDW: 14.5 % (ref 11.5–15.5)
WBC: 9.3 10*3/uL (ref 4.0–10.5)

## 2013-09-27 LAB — BASIC METABOLIC PANEL
Anion gap: 12 (ref 5–15)
BUN: 13 mg/dL (ref 6–23)
CO2: 24 mEq/L (ref 19–32)
Calcium: 8.8 mg/dL (ref 8.4–10.5)
Chloride: 101 mEq/L (ref 96–112)
Creatinine, Ser: 1.18 mg/dL (ref 0.50–1.35)
GFR calc Af Amer: 74 mL/min — ABNORMAL LOW (ref >90)
GFR calc non Af Amer: 64 mL/min — ABNORMAL LOW (ref >90)
Glucose, Bld: 136 mg/dL — ABNORMAL HIGH (ref 70–99)
Potassium: 3.9 mEq/L (ref 3.7–5.3)
Sodium: 137 mEq/L (ref 137–147)

## 2013-09-27 LAB — PRO B NATRIURETIC PEPTIDE: Pro B Natriuretic peptide (BNP): 36 pg/mL (ref 0–125)

## 2013-09-27 LAB — I-STAT TROPONIN, ED: Troponin i, poc: 0 ng/mL (ref 0.00–0.08)

## 2013-09-27 MED ORDER — OXYCODONE-ACETAMINOPHEN 7.5-325 MG PO TABS: 1.0000 | ORAL_TABLET | Freq: Four times a day (QID) | ORAL | Status: DC | PRN

## 2013-09-27 MED ORDER — ONDANSETRON HCL 4 MG/2ML IJ SOLN
4.0000 mg | Freq: Once | INTRAMUSCULAR | Status: AC
  Administered 2013-09-27: 4 mg via INTRAVENOUS
  Filled 2013-09-27: qty 2

## 2013-09-27 MED ORDER — IOHEXOL 350 MG/ML SOLN
100.0000 mL | Freq: Once | INTRAVENOUS | Status: AC | PRN
  Administered 2013-09-27: 100 mL via INTRAVENOUS

## 2013-09-27 MED ORDER — HYDROMORPHONE HCL PF 1 MG/ML IJ SOLN
1.0000 mg | Freq: Once | INTRAMUSCULAR | Status: AC
  Administered 2013-09-27: 1 mg via INTRAVENOUS
  Filled 2013-09-27: qty 1

## 2013-09-27 NOTE — ED Provider Notes (Signed)
CSN: [id number]     Arrival date & time 09/27/13  1804 History   First MD Initiated Contact with Patient 09/27/13 2127     Chief Complaint  Patient presents with  . Chest Pain    The patient says he has been having a dry cough for a week.  He said he was here for the same thing and was sent home with vicodin.       (Consider location/radiation/quality/duration/timing/severity/associated sxs/prior Treatment) Patient is a 63 y.o. male presenting with chest pain. The history is provided by the patient.  Chest Pain Pain location:  R chest and R lateral chest Pain quality: sharp   Pain radiates to the back: yes   Pain severity:  Severe Duration:  2 weeks Timing:  Intermittent Progression:  Worsening Chronicity:  New Context: breathing, movement and raising an arm   Context: not at rest   Relieved by:  Rest Worsened by:  Coughing, exertion and movement Associated symptoms: back pain and shortness of breath   Associated symptoms: no fever, no nausea and not vomiting   Risk factors: male sex and smoking      Pt is a 63 y/o male w/ PMHx of HTN, COPD, CVA, and lobectomy who presents to the ED w/ rt sided chest pain and rt sided back pain. He was seen on 9/6 in the ED for the same reason w/ ACS ruled out and was given a rx for vicodin that has not helped w/ pain. He states that pain has worsened since his last visit and he rates pain as 8-9/10 in severity. Pain is sharp in nature. Pt states that rt chest and back pain worsens w/ movement and is alleviated w/ rest. He is able to sleep throughout the night. Denies fevers, night sweats, and N/V. Denies recent falls or trauma to chest wall. He has SOB that is at his baseline.   Past Medical History  Diagnosis Date  . Hypertension   . Smoker   . History of CVA (cerebrovascular accident) 02-22-2011    LEFT BRAIN STEM PONTINE HEMORRHAGE--  RIGHT SIDED WEAKNESS  . History of CHF (congestive heart failure) MONITORED BY DR BOUSKA    DIASTOLIC  .  Hydrocele, left   . Chronic cough   . Short of breath on exertion   . Harsh voice quality     PT STATES NORMAL FOR HIM  . COPD, severe     PT DENIES REGULAR DAILY INHALER ANY PRN  . Pre-operative clearance     GIVEN BY DR Coletta Memos  . Weakness of right side of body     S/P CVA   FEB 2013   Past Surgical History  Procedure Laterality Date  . Thoracotomy/lobectomy Right 03-06-2008    AND STAPLING OF BLEBS FOR SPONTANOUS PNEUMOTHORAX  . Cardiac catheterization  05-02-2011  DR NISHAN    MILD NONOBSTRUCTIVE CAD/ LAD, OM , AND D1/ EF 60^  . Cardiac catheterization  AUG 2000    NON-OBSTRUTIVE CAD/ EF 68%/ 25% PROXIMAL LAD/ 20% MID CIRCUMFLEX  . Transthoracic echocardiogram  02-17-2011    MODERATE LVH/ LVSF NORMAL/ EF 67-89%/ GRADE I DIASTOLIC DYSFUNCTION  . Inguinal hernia repair Left   . Hydrocele excision Left 04/24/2012    Procedure: HYDROCELECTOMY ADULT;  Surgeon: Fredricka Bonine, MD;  Location: [facility];  Service: Urology;  Laterality: Left;  90 mins req for this case      Family History  Problem Relation Age of Onset  . Hypertension  History  Substance Use Topics  . Smoking status: Current Every Day Smoker -- 1.00 packs/day for 48 years    Types: Cigarettes  . Smokeless tobacco: Never Used  . Alcohol Use: Yes     Comment: OCCASIONAL--      Review of Systems  Constitutional: Negative for fever.  Respiratory: Positive for shortness of breath.   Cardiovascular: Positive for chest pain.  Gastrointestinal: Negative for nausea and vomiting.  Musculoskeletal: Positive for back pain.      Allergies  Review of patient's allergies indicates no known allergies.  Home Medications   Prior to Admission medications   Medication Sig Start Date End Date Taking? Authorizing Provider  albuterol (PROVENTIL HFA;VENTOLIN HFA) 108 (90 BASE) MCG/ACT inhaler Inhale 2 puffs into the lungs every 6 (six) hours as needed (for copd).    Yes Historical Provider, MD   aspirin EC 325 MG tablet Take 325 mg by mouth daily.   Yes Historical Provider, MD  atorvastatin (LIPITOR) 10 MG tablet Take 5 mg by mouth daily.   Yes Historical Provider, MD  cloNIDine (CATAPRES) 0.3 MG tablet Take 0.3 mg by mouth 2 (two) times daily.   Yes Historical Provider, MD  gabapentin (NEURONTIN) 400 MG capsule Take 800 mg by mouth 3 (three) times daily.   Yes Historical Provider, MD  hydrochlorothiazide (HYDRODIURIL) 25 MG tablet Take 25 mg by mouth daily.   Yes Historical Provider, MD  lisinopril (PRINIVIL,ZESTRIL) 20 MG tablet Take 20 mg by mouth 2 (two) times daily.   Yes Historical Provider, MD  metFORMIN (GLUCOPHAGE) 500 MG tablet Take 500 mg by mouth daily with breakfast.   Yes Historical Provider, MD  metoprolol succinate (TOPROL-XL) 100 MG 24 hr tablet Take 50 mg by mouth daily. Take with or immediately following a meal.   Yes Historical Provider, MD  nortriptyline (PAMELOR) 25 MG capsule Take 25 mg by mouth at bedtime.   Yes Historical Provider, MD  vitamin B-12 (CYANOCOBALAMIN) 500 MCG tablet Take 500 mcg by mouth daily.   Yes Historical Provider, MD  zolpidem (AMBIEN) 5 MG tablet Take 5 mg by mouth at bedtime as needed for sleep.  09/04/13  Yes Historical Provider, MD  oxyCODONE-acetaminophen (PERCOCET) 7.5-325 MG per tablet Take 1 tablet by mouth every 6 (six) hours as needed for pain. 09/27/13   Dot Lanes, MD   BP 144/84  Pulse 69  Temp(Src) 98.5 F (36.9 C) (Oral)  Resp 19  SpO2 95% Physical Exam  Constitutional: He appears well-developed and well-nourished. No distress.  Cardiovascular: Normal rate and regular rhythm.   No murmur heard. Pulmonary/Chest: Effort normal. He has wheezes (expiratory wheezes on lt lung fields).  Abdominal: Soft. Bowel sounds are normal. There is no tenderness.  Musculoskeletal:  Tender to palpation of rt lateral thoracic region above lobectomy incision scar. No chest wall tenderness. No pain on passive movement of rt arm.     ED  Course  Procedures (including critical care time) Labs Review Labs Reviewed  BASIC METABOLIC PANEL - Abnormal; Notable for the following:    Glucose, Bld 136 (*)    GFR calc non Af Amer 64 (*)    GFR calc Af Amer 74 (*)    All other components within normal limits  PRO B NATRIURETIC PEPTIDE  CBC WITH DIFFERENTIAL  Randolm Idol, ED    Imaging Review Dg Chest 2 View  09/27/2013   CLINICAL DATA:  Chest pain. Dry cough for a week. History of pneumothorax.  EXAM: CHEST  2 VIEW  COMPARISON:  09/22/2013.  FINDINGS: Emphysema is present with large bulla at the RIGHT base. Basilar atelectasis. Surgical clips present around the RIGHT hilum. Defect in the RIGHT posterior sixth rib compatible with prior thoracotomy. Tortuous thoracic aorta. Cardiopericardial silhouette within normal limits for projection.  IMPRESSION: No acute abnormality. Unchanged severe bullous emphysema and postoperative changes of the chest.   Electronically Signed   By: Dereck Ligas [name]   On: 09/27/2013 20:43   Ct Angio Chest W/cm &/or Wo Cm  09/27/2013   CLINICAL DATA:  Chest pain.  EXAM: CT ANGIOGRAPHY CHEST WITH CONTRAST  TECHNIQUE: Multidetector CT imaging of the chest was performed using the standard protocol during bolus administration of intravenous contrast. Multiplanar CT image reconstructions and MIPs were obtained to evaluate the vascular anatomy.  CONTRAST:  153mL OMNIPAQUE IOHEXOL 350 MG/ML SOLN  COMPARISON:  09/24/2009.  FINDINGS: The chest wall is unremarkable.  The heart is normal in size. No pericardial effusion. No mediastinal or hilar mass. There are small right paratracheal and right hilar lymph nodes. The esophagus is grossly normal. There is a small hiatal hernia and mild distal esophageal wall thickening. There is marked tortuosity, ectasia and calcification of the thoracic aorta but no dissection.  The pulmonary arterial tree is fairly well opacified. No filling defects to suggest pulmonary emboli.   Examination of the lung parenchyma demonstrates severe emphysematous changes with large apical bulla. There is a mass in the lingula adjacent to the heart which measures 2.7 x 1.7 cm. This is worrisome for a primary lung neoplasm. PET-CT suggested for further evaluation. There is a right-sided pleural lipoma. No infiltrates or edema. No pleural effusion.  The upper abdomen is grossly normal.  Review of the MIP images confirms the above findings.  IMPRESSION: 1. No CT findings for pulmonary embolism. 2. Tortuosity, ectasia and calcification of the thoracic aorta but no dissection. 3. Severe bullous emphysema. 4. Soft tissue mass in the lingula adjacent to the heart could be compressive atelectasis but suspicious for pulmonary neoplasm. PET-CT suggested for further evaluation.   Electronically Signed   By: Kalman Jewels [name]   On: 09/27/2013 23:14     EKG Interpretation   Date/Time:  Friday September 27 2013 18:10:54 EDT Ventricular Rate:  74 PR Interval:  164 QRS Duration: 94 QT Interval:  408 QTC Calculation: 452 R Axis:   -55 Text Interpretation:  Normal sinus rhythm Left anterior fascicular block  Nonspecific ST and T wave abnormality Abnormal ECG No significant change  since last tracing Confirmed by BEATON  MD, ROBERT ([id number]) on 09/27/2013  9:53:24 PM      MDM   Final diagnoses:  Lung mass    Pt presents to the ED with worsening of his rt sided back and chest pain since 9/6. He has tried vicodin w/o any relief in pain. CXR unchanged from previous xray, no signs of PNA. Troponin x 1 negative. EKG negative for ST elevation. Will proceed w/ CT angio.   CT angio reveals soft tissue mass in the lingula adjacent to the heart that could be compressive atelectasis but suspicious for pulmonary neoplasm. Will need PET-CT further evaluation. IV hydromorphone started for pain control.  Will need admission to hospital for further work up. Consult to hospitalist made.   Hospitalist to admit  patient to med surg.      Julious Oka, MD 09/27/13 3244  Julious Oka, MD 09/27/13 [phone]

## 2013-09-27 NOTE — H&P (Signed)
[facility] History and Physical  ADITH TEJADA XAJ:[id number] DOB: 11-Mar-1950 DOA: 09/27/2013  Referring physician: ED physician PCP: Phineas Inches, MD  Specialists:   Chief Complaint: Chest pain  HPI: Jonathon [name] is a 63 y.o. male with PMH of PMHx of smoking, HTN, COPD, CVA, DM-II,  and lobectomy who presents to the ED w/ rt sided chest pain and rt sided back pain.  Patient reports that his chest pain started 1 month ago, progressively getting worse, particularly in the last week. He was seen in ED on 9/6 for the same reason. He was ruled out for ACS, and discharged to home with a prescription of Vicodin. He has been taking pain medication, without significant help. Chest pain is located at right upper chest, radiating to the right shoulder, right arm and right side of the back. It is constant, 8/10 in severity, stabbing-like pain. It is aggravated by moving her right arm. Patient has baseline cough and SOB due COPD, which have not been worsening. He does not have fever, chills, nausea, vomiting, abdominal pain, diarrhea. Troponin x 1 negative. CT angio reveals soft tissue mass in the lingula adjacent to the heart that could be compressive atelectasis but suspicious for pulmonary neoplasm per radiologist. Patient is admitted to the inpatient for further evaluation and pain control.   Review of Systems: As presented in the history of presenting illness, rest negative.  Where does patient live?  Lives with wife in [place] Can patient participate in ADLs? Yes  Allergy: No Known Allergies  Past Medical History  Diagnosis Date  . Hypertension   . Smoker   . History of CVA (cerebrovascular accident) 02-22-2011    LEFT BRAIN STEM PONTINE HEMORRHAGE--  RIGHT SIDED WEAKNESS  . History of CHF (congestive heart failure) MONITORED BY DR BOUSKA    DIASTOLIC  . Hydrocele, left   . Chronic cough   . Short of breath on exertion   . Harsh voice quality     PT STATES NORMAL  FOR HIM  . COPD, severe     PT DENIES REGULAR DAILY INHALER ANY PRN  . Pre-operative clearance     GIVEN BY DR Coletta Memos  . Weakness of right side of body     S/P CVA   FEB 2013    Past Surgical History  Procedure Laterality Date  . Thoracotomy/lobectomy Right 03-06-2008    AND STAPLING OF BLEBS FOR SPONTANOUS PNEUMOTHORAX  . Cardiac catheterization  05-02-2011  DR NISHAN    MILD NONOBSTRUCTIVE CAD/ LAD, OM , AND D1/ EF 60^  . Cardiac catheterization  AUG 2000    NON-OBSTRUTIVE CAD/ EF 68%/ 25% PROXIMAL LAD/ 20% MID CIRCUMFLEX  . Transthoracic echocardiogram  02-17-2011    MODERATE LVH/ LVSF NORMAL/ EF 09-47%/ GRADE I DIASTOLIC DYSFUNCTION  . Inguinal hernia repair Left   . Hydrocele excision Left 04/24/2012    Procedure: HYDROCELECTOMY ADULT;  Surgeon: Fredricka Bonine, MD;  Location: [facility];  Service: Urology;  Laterality: Left;  90 mins req for this case       Social History:  reports that he has been smoking Cigarettes.  He has a 48 pack-year smoking history. He has never used smokeless tobacco. He reports that he drinks alcohol. He reports that he does not use illicit drugs.  Family History:  Family History  Problem Relation Age of Onset  . Hypertension Mother   . Lung disease Father     died of "colapsed lung" per patient  Prior to Admission medications   Medication Sig Start Date End Date Taking? Authorizing Provider  albuterol (PROVENTIL HFA;VENTOLIN HFA) 108 (90 BASE) MCG/ACT inhaler Inhale 2 puffs into the lungs every 6 (six) hours as needed (for copd).    Yes Historical Provider, MD  aspirin EC 325 MG tablet Take 325 mg by mouth daily.   Yes Historical Provider, MD  atorvastatin (LIPITOR) 10 MG tablet Take 5 mg by mouth daily.   Yes Historical Provider, MD  cloNIDine (CATAPRES) 0.3 MG tablet Take 0.3 mg by mouth 2 (two) times daily.   Yes Historical Provider, MD  gabapentin (NEURONTIN) 400 MG capsule Take 800 mg by mouth 3 (three) times  daily.   Yes Historical Provider, MD  hydrochlorothiazide (HYDRODIURIL) 25 MG tablet Take 25 mg by mouth daily.   Yes Historical Provider, MD  lisinopril (PRINIVIL,ZESTRIL) 20 MG tablet Take 20 mg by mouth 2 (two) times daily.   Yes Historical Provider, MD  metFORMIN (GLUCOPHAGE) 500 MG tablet Take 500 mg by mouth daily with breakfast.   Yes Historical Provider, MD  metoprolol succinate (TOPROL-XL) 100 MG 24 hr tablet Take 50 mg by mouth daily. Take with or immediately following a meal.   Yes Historical Provider, MD  nortriptyline (PAMELOR) 25 MG capsule Take 25 mg by mouth at bedtime.   Yes Historical Provider, MD  vitamin B-12 (CYANOCOBALAMIN) 500 MCG tablet Take 500 mcg by mouth daily.   Yes Historical Provider, MD  zolpidem (AMBIEN) 5 MG tablet Take 5 mg by mouth at bedtime as needed for sleep.  09/04/13  Yes Historical Provider, MD  oxyCODONE-acetaminophen (PERCOCET) 7.5-325 MG per tablet Take 1 tablet by mouth every 6 (six) hours as needed for pain. 09/27/13   Dot Lanes, MD    Physical Exam: Filed Vitals:   09/27/13 2330 09/27/13 2345 09/28/13 0001 09/28/13 0015  BP: 138/85 136/92  135/96  Pulse: 61 63  69  Temp:   98.3 F (36.8 C)   TempSrc:   Oral   Resp: 17 18  19   SpO2: 95% 95%  94%   General: Not in acute distress HEENT:       Eyes: PERRL, EOMI, no scleral icterus       ENT: No discharge from the ears and nose, no pharynx injection, no tonsillar enlargement.        Neck: No JVD, no bruit, no mass felt. Cardiac: S1/S2, RRR, No murmurs, gallops or rubs Pulm: Good air movement bilaterally. Clear to auscultation bilaterally. No rales, wheezing, rhonchi or rubs. Abd: Soft, nondistended, nontender, no rebound pain, no organomegaly, BS present Ext: No edema. 2+DP/PT pulse bilaterally Musculoskeletal: there is large well healed surgical scar over the right lateral thoracic and back region. There is significant tenderness to palpation of right lateral thoracic region above  surgical scar. No chest wall tenderness.  Skin: No rashes.  Neuro: Alert and oriented X3, cranial nerves II-XII grossly intact, muscle strength 5/5 in all extremeties, sensation to light touch intact. Brachial reflex 2+ bilaterally. Knee reflex 1+ bilaterally.  Psych: Patient is not psychotic, no suicidal or hemocidal ideation.  Labs on Admission:  Basic Metabolic Panel:  Recent Labs Lab 09/22/13 1650 09/27/13 1848  NA 139 137  K 4.0 3.9  CL 100 101  CO2 25 24  GLUCOSE 132* 136*  BUN 16 13  CREATININE 1.23 1.18  CALCIUM 9.2 8.8   Liver Function Tests: No results found for this basename: AST, ALT, ALKPHOS, BILITOT, PROT, ALBUMIN,  in the  last 168 hours No results found for this basename: LIPASE, AMYLASE,  in the last 168 hours No results found for this basename: AMMONIA,  in the last 168 hours CBC:  Recent Labs Lab 09/22/13 1848 09/27/13 1848  WBC 9.4 9.3  NEUTROABS 4.7 4.8  HGB 15.3 15.4  HCT 44.1 44.9  MCV 86.5 87.4  PLT 217 222   Cardiac Enzymes: No results found for this basename: CKTOTAL, CKMB, CKMBINDEX, TROPONINI,  in the last 168 hours  BNP (last 3 results)  Recent Labs  09/27/13 1848  PROBNP 36.0   CBG: No results found for this basename: GLUCAP,  in the last 168 hours  Radiological Exams on Admission: Dg Chest 2 View  09/27/2013   CLINICAL DATA:  Chest pain. Dry cough for a week. History of pneumothorax.  EXAM: CHEST  2 VIEW  COMPARISON:  09/22/2013.  FINDINGS: Emphysema is present with large bulla at the RIGHT base. Basilar atelectasis. Surgical clips present around the RIGHT hilum. Defect in the RIGHT posterior sixth rib compatible with prior thoracotomy. Tortuous thoracic aorta. Cardiopericardial silhouette within normal limits for projection.  IMPRESSION: No acute abnormality. Unchanged severe bullous emphysema and postoperative changes of the chest.   Electronically Signed   By: Dereck Ligas [name]   On: 09/27/2013 20:43   Ct Angio Chest W/cm &/or  Wo Cm  09/27/2013   CLINICAL DATA:  Chest pain.  EXAM: CT ANGIOGRAPHY CHEST WITH CONTRAST  TECHNIQUE: Multidetector CT imaging of the chest was performed using the standard protocol during bolus administration of intravenous contrast. Multiplanar CT image reconstructions and MIPs were obtained to evaluate the vascular anatomy.  CONTRAST:  140mL OMNIPAQUE IOHEXOL 350 MG/ML SOLN  COMPARISON:  09/24/2009.  FINDINGS: The chest wall is unremarkable.  The heart is normal in size. No pericardial effusion. No mediastinal or hilar mass. There are small right paratracheal and right hilar lymph nodes. The esophagus is grossly normal. There is a small hiatal hernia and mild distal esophageal wall thickening. There is marked tortuosity, ectasia and calcification of the thoracic aorta but no dissection.  The pulmonary arterial tree is fairly well opacified. No filling defects to suggest pulmonary emboli.  Examination of the lung parenchyma demonstrates severe emphysematous changes with large apical bulla. There is a mass in the lingula adjacent to the heart which measures 2.7 x 1.7 cm. This is worrisome for a primary lung neoplasm. PET-CT suggested for further evaluation. There is a right-sided pleural lipoma. No infiltrates or edema. No pleural effusion.  The upper abdomen is grossly normal.  Review of the MIP images confirms the above findings.  IMPRESSION: 1. No CT findings for pulmonary embolism. 2. Tortuosity, ectasia and calcification of the thoracic aorta but no dissection. 3. Severe bullous emphysema. 4. Soft tissue mass in the lingula adjacent to the heart could be compressive atelectasis but suspicious for pulmonary neoplasm. PET-CT suggested for further evaluation.   Electronically Signed   By: Kalman Jewels [name]   On: 09/27/2013 23:14    EKG: Independently reviewed. Sinus rhythm, regular, left axis deviation, normal R wave progression, normal QT interval, No ischemic change in T waves or ST  segments.  Assessment/Plan Principal Problem:   Chest pain Active Problems:   COPD (chronic obstructive pulmonary disease)   Hypertension   History of Left Stroke, hemorrhagic   Tobacco abuse   DM II (diabetes mellitus, type II), controlled  1. chest pain: Patient's chest pain is more likely from his chest wall pain.there is significant tenderness  over the right lateral thoracic region above surgical scar. Given patient's significant risk factors, including smoking, diabetes, hypertension, it is important to rule out ACS. Initial workup was negative for troponin and EKG. Pulmonary embolism was ruled out by negative CTA, which accidentally revealed a soft tissue mass in the lingula adjacent to the heart. This could be compressive atelectasis but suspicious for pulmonary neoplasm per radiologist.   -Will admit to med-surg bed - pain control: MS-contin bid and percocet q8h, plus prn dilaudid - trop X3 - Zofran for nausea - repeat EKG in AM - Consider PET-CT   2. COPD: Stable. No signs of acute exacerbation. Lung auscultation is clear bilaterally -Continue home albuterol inhaler  3.hypertension: Blood pressure is reasonably controlled -Continue home medications  4.type 2 diabetes: recently diagnosed. On metformin at home -Discontinue metformin -SSI - A1c  5.tobacco abuse:  - Consult to social  6. history of hemorrhagic stroke: No function deficit. No new issues -Continue home medications, including aspirin and lipito    DVT ppx: SQ Heparin  Code Status: Full code Family Communication: Yes, patient's wife at bed side Disposition Plan: Admit to inpatient  Ivor Costa [facility] Pager [phone]  If 7PM-7AM, please contact night-coverage [email or web address] Password [email or web address] 09/28/2013, 12:30 AM

## 2013-09-27 NOTE — Discharge Instructions (Signed)

## 2013-09-27 NOTE — ED Notes (Signed)
The patient says he has been having a dry cough for a week.  He said he was here for the same thing and was sent home with vicodin.  The patient says when he moves, cough or does anything his right side of his chest hurts and it radiates to the back.   He says sometimes he is a little SOB, but no other symptoms.

## 2013-09-28 ENCOUNTER — Encounter (HOSPITAL_COMMUNITY): Payer: Self-pay | Admitting: Internal Medicine

## 2013-09-28 DIAGNOSIS — R071 Chest pain on breathing: Secondary | ICD-10-CM

## 2013-09-28 DIAGNOSIS — F17200 Nicotine dependence, unspecified, uncomplicated: Secondary | ICD-10-CM | POA: Diagnosis not present

## 2013-09-28 DIAGNOSIS — R0789 Other chest pain: Secondary | ICD-10-CM | POA: Diagnosis not present

## 2013-09-28 DIAGNOSIS — I5030 Unspecified diastolic (congestive) heart failure: Secondary | ICD-10-CM | POA: Diagnosis not present

## 2013-09-28 DIAGNOSIS — E119 Type 2 diabetes mellitus without complications: Secondary | ICD-10-CM | POA: Diagnosis present

## 2013-09-28 DIAGNOSIS — J449 Chronic obstructive pulmonary disease, unspecified: Secondary | ICD-10-CM | POA: Diagnosis not present

## 2013-09-28 LAB — COMPREHENSIVE METABOLIC PANEL
ALT: 12 U/L (ref 0–53)
AST: 13 U/L (ref 0–37)
Albumin: 3.1 g/dL — ABNORMAL LOW (ref 3.5–5.2)
Alkaline Phosphatase: 101 U/L (ref 39–117)
Anion gap: 9 (ref 5–15)
BUN: 10 mg/dL (ref 6–23)
CO2: 27 mEq/L (ref 19–32)
Calcium: 8.7 mg/dL (ref 8.4–10.5)
Chloride: 103 mEq/L (ref 96–112)
Creatinine, Ser: 1 mg/dL (ref 0.50–1.35)
GFR calc Af Amer: >90 mL/min (ref >90)
GFR calc non Af Amer: 78 mL/min — ABNORMAL LOW (ref >90)
Glucose, Bld: 127 mg/dL — ABNORMAL HIGH (ref 70–99)
Potassium: 3.7 mEq/L (ref 3.7–5.3)
Sodium: 139 mEq/L (ref 137–147)
Total Bilirubin: 0.3 mg/dL (ref 0.3–1.2)
Total Protein: 7 g/dL (ref 6.0–8.3)

## 2013-09-28 LAB — GLUCOSE, CAPILLARY
Glucose-Capillary: 109 mg/dL — ABNORMAL HIGH (ref 70–99)
Glucose-Capillary: 112 mg/dL — ABNORMAL HIGH (ref 70–99)
Glucose-Capillary: 121 mg/dL — ABNORMAL HIGH (ref 70–99)

## 2013-09-28 LAB — PROTIME-INR
INR: 1.06 (ref 0.00–1.49)
Prothrombin Time: 13.8 seconds (ref 11.6–15.2)

## 2013-09-28 LAB — APTT: aPTT: 33 seconds (ref 24–37)

## 2013-09-28 LAB — TROPONIN I
Troponin I: <0.3 ng/mL (ref <0.30)
Troponin I: <0.3 ng/mL (ref <0.30)
Troponin I: <0.3 ng/mL (ref <0.30)

## 2013-09-28 LAB — HEMOGLOBIN A1C
Hgb A1c MFr Bld: 6.7 % — ABNORMAL HIGH (ref <5.7)
Mean Plasma Glucose: 146 mg/dL — ABNORMAL HIGH (ref <117)

## 2013-09-28 MED ORDER — HYDROCHLOROTHIAZIDE 25 MG PO TABS
25.0000 mg | ORAL_TABLET | Freq: Every day | ORAL | Status: DC
  Administered 2013-09-28 – 2013-09-29 (×2): 25 mg via ORAL
  Filled 2013-09-28 (×2): qty 1

## 2013-09-28 MED ORDER — ASPIRIN EC 325 MG PO TBEC
325.0000 mg | DELAYED_RELEASE_TABLET | Freq: Every day | ORAL | Status: DC
  Administered 2013-09-28 – 2013-09-29 (×2): 325 mg via ORAL
  Filled 2013-09-28 (×3): qty 1

## 2013-09-28 MED ORDER — ATORVASTATIN CALCIUM 10 MG PO TABS
5.0000 mg | ORAL_TABLET | Freq: Every day | ORAL | Status: DC
  Administered 2013-09-28 – 2013-09-29 (×2): 5 mg via ORAL
  Filled 2013-09-28 (×2): qty 0.5

## 2013-09-28 MED ORDER — NORTRIPTYLINE HCL 25 MG PO CAPS
25.0000 mg | ORAL_CAPSULE | Freq: Every day | ORAL | Status: DC
  Administered 2013-09-28 (×2): 25 mg via ORAL
  Filled 2013-09-28 (×3): qty 1

## 2013-09-28 MED ORDER — MORPHINE SULFATE ER 30 MG PO TBCR
30.0000 mg | EXTENDED_RELEASE_TABLET | Freq: Two times a day (BID) | ORAL | Status: DC
  Administered 2013-09-28: 30 mg via ORAL
  Filled 2013-09-28 (×2): qty 1

## 2013-09-28 MED ORDER — OXYCODONE-ACETAMINOPHEN 5-325 MG PO TABS
1.0000 | ORAL_TABLET | Freq: Four times a day (QID) | ORAL | Status: DC | PRN
  Administered 2013-09-28: 2 via ORAL
  Filled 2013-09-28: qty 2

## 2013-09-28 MED ORDER — INFLUENZA VAC SPLIT QUAD 0.5 ML IM SUSY
0.5000 mL | PREFILLED_SYRINGE | INTRAMUSCULAR | Status: AC
  Administered 2013-09-29: 0.5 mL via INTRAMUSCULAR
  Filled 2013-09-28: qty 0.5

## 2013-09-28 MED ORDER — METOPROLOL SUCCINATE ER 50 MG PO TB24
50.0000 mg | ORAL_TABLET | Freq: Every day | ORAL | Status: DC
  Administered 2013-09-28 – 2013-09-29 (×2): 50 mg via ORAL
  Filled 2013-09-28 (×2): qty 1

## 2013-09-28 MED ORDER — MORPHINE SULFATE ER 15 MG PO TBCR
15.0000 mg | EXTENDED_RELEASE_TABLET | Freq: Two times a day (BID) | ORAL | Status: DC
  Administered 2013-09-28 – 2013-09-29 (×2): 15 mg via ORAL
  Filled 2013-09-28 (×2): qty 1

## 2013-09-28 MED ORDER — SODIUM CHLORIDE 0.9 % IV SOLN: 250.0000 mL | INTRAVENOUS | Status: DC | PRN

## 2013-09-28 MED ORDER — TRAMADOL HCL 50 MG PO TABS: 50.0000 mg | ORAL_TABLET | Freq: Two times a day (BID) | ORAL | Status: DC | PRN

## 2013-09-28 MED ORDER — ONDANSETRON HCL 4 MG/2ML IJ SOLN
4.0000 mg | Freq: Three times a day (TID) | INTRAMUSCULAR | Status: AC | PRN
  Administered 2013-09-28: 4 mg via INTRAVENOUS
  Filled 2013-09-28: qty 2

## 2013-09-28 MED ORDER — HEPARIN SODIUM (PORCINE) 5000 UNIT/ML IJ SOLN
5000.0000 [IU] | Freq: Three times a day (TID) | INTRAMUSCULAR | Status: DC
  Administered 2013-09-28 – 2013-09-29 (×4): 5000 [IU] via SUBCUTANEOUS
  Filled 2013-09-28 (×6): qty 1

## 2013-09-28 MED ORDER — CYANOCOBALAMIN 500 MCG PO TABS
500.0000 ug | ORAL_TABLET | Freq: Every day | ORAL | Status: DC
  Administered 2013-09-28 – 2013-09-29 (×2): 500 ug via ORAL
  Filled 2013-09-28 (×2): qty 1

## 2013-09-28 MED ORDER — HYDROMORPHONE HCL PF 1 MG/ML IJ SOLN: 1.0000 mg | INTRAMUSCULAR | Status: DC | PRN

## 2013-09-28 MED ORDER — MORPHINE SULFATE 2 MG/ML IJ SOLN
2.0000 mg | INTRAMUSCULAR | Status: DC | PRN
  Administered 2013-09-28: 2 mg via INTRAVENOUS
  Filled 2013-09-28: qty 1

## 2013-09-28 MED ORDER — SODIUM CHLORIDE 0.9 % IJ SOLN
3.0000 mL | INTRAMUSCULAR | Status: DC | PRN
Start: 2013-09-28 — End: 2013-09-28

## 2013-09-28 MED ORDER — INSULIN ASPART 100 UNIT/ML ~~LOC~~ SOLN
0.0000 [IU] | Freq: Three times a day (TID) | SUBCUTANEOUS | Status: DC
Start: 2013-09-28 — End: 2013-09-29
  Administered 2013-09-28: 1 [IU] via SUBCUTANEOUS

## 2013-09-28 MED ORDER — ALBUTEROL SULFATE (2.5 MG/3ML) 0.083% IN NEBU
3.0000 mL | INHALATION_SOLUTION | Freq: Four times a day (QID) | RESPIRATORY_TRACT | Status: DC | PRN
  Administered 2013-09-28: 3 mL via RESPIRATORY_TRACT
  Filled 2013-09-28: qty 3

## 2013-09-28 MED ORDER — GABAPENTIN 400 MG PO CAPS
800.0000 mg | ORAL_CAPSULE | Freq: Three times a day (TID) | ORAL | Status: DC
  Administered 2013-09-28 – 2013-09-29 (×4): 800 mg via ORAL
  Filled 2013-09-28 (×6): qty 2

## 2013-09-28 MED ORDER — ZOLPIDEM TARTRATE 5 MG PO TABS: 5.0000 mg | ORAL_TABLET | Freq: Every evening | ORAL | Status: DC | PRN

## 2013-09-28 MED ORDER — SODIUM CHLORIDE 0.9 % IJ SOLN
3.0000 mL | Freq: Two times a day (BID) | INTRAMUSCULAR | Status: DC
  Administered 2013-09-28 – 2013-09-29 (×3): 3 mL via INTRAVENOUS

## 2013-09-28 MED ORDER — SODIUM CHLORIDE 0.9 % IJ SOLN
3.0000 mL | Freq: Two times a day (BID) | INTRAMUSCULAR | Status: DC
  Administered 2013-09-28: 3 mL via INTRAVENOUS

## 2013-09-28 MED ORDER — LISINOPRIL 20 MG PO TABS
20.0000 mg | ORAL_TABLET | Freq: Two times a day (BID) | ORAL | Status: DC
  Administered 2013-09-28 – 2013-09-29 (×4): 20 mg via ORAL
  Filled 2013-09-28 (×5): qty 1

## 2013-09-28 MED ORDER — CLONIDINE HCL 0.3 MG PO TABS
0.3000 mg | ORAL_TABLET | Freq: Two times a day (BID) | ORAL | Status: DC
  Administered 2013-09-28 – 2013-09-29 (×4): 0.3 mg via ORAL
  Filled 2013-09-28 (×5): qty 1

## 2013-09-28 MED ORDER — NICOTINE 21 MG/24HR TD PT24
21.0000 mg | MEDICATED_PATCH | Freq: Every day | TRANSDERMAL | Status: DC
  Filled 2013-09-28 (×2): qty 1

## 2013-09-28 NOTE — Progress Notes (Signed)
Pt c/o vomitting and severe chest pain. Zofran and morphine iv given. Oxygen at 2l given as well. Vitals stable. Eliseo Squires MD made aware.

## 2013-09-28 NOTE — Progress Notes (Signed)
Pt admitted to the unit. Pt is alert and oriented. Pt oriented to room, staff, and call bell. Bed in lowest position. Full assessment to Epic. Call bell with in reach. Told to call for assists. Will continue to monitor.  Jonathon [name] E  

## 2013-09-28 NOTE — Progress Notes (Signed)
Called to get report. RN will call back.

## 2013-09-28 NOTE — Progress Notes (Signed)
Patient admitted by Dr. Blaine Hamper- please see H&P. 1. chest pain:  -long term pain - pain control: MS-contin bid and ultram prn - trop X3 neg - Zofran for nausea  -  PET-CT outpt  COPD: Stable. No signs of acute exacerbation. Lung auscultation is clear bilaterally  -Continue home albuterol inhaler   hypertension: Blood pressure is reasonably controlled  -Continue home medications   type 2 diabetes: recently diagnosed. On metformin at home  -Discontinue metformin  -SSI  - A1c   tobacco abuse:  - Consult to social    history of hemorrhagic stroke: No function deficit. No new issues  -Continue home medications, including aspirin and lipito   Home in AM?

## 2013-09-28 NOTE — Clinical Social Work Note (Signed)
CSW consulted by MD regarding tobacco abuse. CSW spoke with patient's RN [name]) regarding smoking cessation program. Per [name], patient is no longer interested in smoking resources. No further needs. CSW signing off.  [place], [place] [facility] Clinical Social Worker [phone]

## 2013-09-28 NOTE — ED Notes (Signed)
Hospitalist at the bedside 

## 2013-09-29 DIAGNOSIS — R0789 Other chest pain: Secondary | ICD-10-CM | POA: Diagnosis not present

## 2013-09-29 DIAGNOSIS — J449 Chronic obstructive pulmonary disease, unspecified: Secondary | ICD-10-CM

## 2013-09-29 DIAGNOSIS — I10 Essential (primary) hypertension: Secondary | ICD-10-CM

## 2013-09-29 LAB — GLUCOSE, CAPILLARY
Glucose-Capillary: 119 mg/dL — ABNORMAL HIGH (ref 70–99)
Glucose-Capillary: 117 mg/dL — ABNORMAL HIGH (ref 70–99)

## 2013-09-29 MED ORDER — MORPHINE SULFATE ER 15 MG PO TBCR: 15.0000 mg | EXTENDED_RELEASE_TABLET | Freq: Two times a day (BID) | ORAL | Status: DC

## 2013-09-29 MED ORDER — ALBUTEROL SULFATE (2.5 MG/3ML) 0.083% IN NEBU: 2.5000 mg | INHALATION_SOLUTION | Freq: Four times a day (QID) | RESPIRATORY_TRACT | Status: DC | PRN

## 2013-09-29 MED ORDER — NICOTINE 21 MG/24HR TD PT24
21.0000 mg | MEDICATED_PATCH | Freq: Every day | TRANSDERMAL | Status: DC
Start: 2013-09-29 — End: 2013-10-04

## 2013-09-29 MED ORDER — OXYCODONE-ACETAMINOPHEN 7.5-325 MG PO TABS: 1.0000 | ORAL_TABLET | Freq: Four times a day (QID) | ORAL | Status: DC | PRN

## 2013-09-29 NOTE — Progress Notes (Signed)
Patient discharge teaching given, including activity, diet, follow-up appoints, and medications. Patient verbalized understanding of all discharge instructions. IV access was d/c'd. Vitals are stable. Skin is intact except as charted in most recent assessments. Pt to be escorted out by NT, to be driven home by family. 

## 2013-09-29 NOTE — ED Provider Notes (Signed)
I saw and evaluated the patient, reviewed the resident's note and I agree with the findings and plan.   .Face to face Exam:  General:  Awake HEENT:  Atraumatic Resp:  Normal effort Abd:  Nondistended Neuro:No focal weakness Lymph: No adenopathy  I reviewed and discussed the ekg with the resident  Dot Lanes, MD 09/29/13 1025

## 2013-09-29 NOTE — Discharge Summary (Signed)
Physician Discharge Summary  Jonathon [name] JSE:[id number] DOB: 19-Dec-1950 DOA: 09/27/2013  PCP: Phineas Inches, MD  Admit date: 09/27/2013 Discharge date: 09/29/2013  Time spent: 35 minutes  Recommendations for Outpatient Follow-up:  1. Needs PET CT 2. Stop smoking  Discharge Diagnoses:  Principal Problem:   Chest pain Active Problems:   COPD (chronic obstructive pulmonary disease)   Hypertension   History of Left Stroke, hemorrhagic   Tobacco abuse   DM II (diabetes mellitus, type II), controlled   Discharge Condition: improved  Diet recommendation: cardiac  Filed Weights   09/28/13 0125 09/28/13 0624 09/29/13 0519  Weight: 98.612 kg (217 lb 6.4 oz) 98.612 kg (217 lb 6.4 oz) 98.748 kg (217 lb 11.2 oz)    History of present illness:  Jonathon [name] is a 63 y.o. male with PMH of PMHx of smoking, HTN, COPD, CVA, DM-II, and lobectomy who presents to the ED w/ rt sided chest pain and rt sided back pain.  Patient reports that his chest pain started 1 month ago, progressively getting worse, particularly in the last week. He was seen in ED on 9/6 for the same reason. He was ruled out for ACS, and discharged to home with a prescription of Vicodin. He has been taking pain medication, without significant help. Chest pain is located at right upper chest, radiating to the right shoulder, right arm and right side of the back. It is constant, 8/10 in severity, stabbing-like pain. It is aggravated by moving her right arm. Patient has baseline cough and SOB due COPD, which have not been worsening. He does not have fever, chills, nausea, vomiting, abdominal pain, diarrhea. Troponin x 1 negative. CT angio reveals soft tissue mass in the lingula adjacent to the heart that could be compressive atelectasis but suspicious for pulmonary neoplasm per radiologist. Patient is admitted to the inpatient for further evaluation and pain control.   Hospital Course:  chest pain:  -long term pain - per  noted - pain control: MS-contin bid and PRN  - trop X3 neg  - Zofran for nausea  - PET-CT outpt   Severe COPD: Stable. No signs of acute exacerbation. Lung auscultation is clear bilaterally  -Continue home albuterol inhaler   hypertension: Blood pressure is reasonably controlled  -Continue home medications   type 2 diabetes: recently diagnosed. On metformin at home  -Discontinue metformin  -SSI  - A1c   tobacco abuse:  - Consult to social  history of hemorrhagic stroke: No function deficit. No new issues   -Continue home medications, including aspirin and lipito    Procedures:    Consultations:    Discharge Exam: Filed Vitals:   09/29/13 0921  BP: 127/62  Pulse: 52  Temp:   Resp:     General: A+Ox3, NAD Cardiovascular: rrr Respiratory: no wheezing  Discharge Instructions You were cared for by a hospitalist during your hospital stay. If you have any questions about your discharge medications or the care you received while you were in the hospital after you are discharged, you can call the unit and asked to speak with the hospitalist on call if the hospitalist that took care of you is not available. Once you are discharged, your primary care physician will handle any further medical issues. Please note that NO REFILLS for any discharge medications will be authorized once you are discharged, as it is imperative that you return to your primary care physician (or establish a relationship with a primary care physician if you do not have  one) for your aftercare needs so that they can reassess your need for medications and monitor your lab values.   Current Discharge Medication List    START taking these medications   Details  oxyCODONE-acetaminophen (PERCOCET) 7.5-325 MG per tablet Take 1 tablet by mouth every 6 (six) hours as needed for pain. Qty: 30 tablet, Refills: 0      CONTINUE these medications which have NOT CHANGED   Details  albuterol (PROVENTIL  HFA;VENTOLIN HFA) 108 (90 BASE) MCG/ACT inhaler Inhale 2 puffs into the lungs every 6 (six) hours as needed (for copd).     aspirin EC 325 MG tablet Take 325 mg by mouth daily.    atorvastatin (LIPITOR) 10 MG tablet Take 5 mg by mouth daily.    cloNIDine (CATAPRES) 0.3 MG tablet Take 0.3 mg by mouth 2 (two) times daily.    gabapentin (NEURONTIN) 400 MG capsule Take 800 mg by mouth 3 (three) times daily.    hydrochlorothiazide (HYDRODIURIL) 25 MG tablet Take 25 mg by mouth daily.    lisinopril (PRINIVIL,ZESTRIL) 20 MG tablet Take 20 mg by mouth 2 (two) times daily.    metFORMIN (GLUCOPHAGE) 500 MG tablet Take 500 mg by mouth daily with breakfast.    metoprolol succinate (TOPROL-XL) 100 MG 24 hr tablet Take 50 mg by mouth daily. Take with or immediately following a meal.    nortriptyline (PAMELOR) 25 MG capsule Take 25 mg by mouth at bedtime.    vitamin B-12 (CYANOCOBALAMIN) 500 MCG tablet Take 500 mcg by mouth daily.    zolpidem (AMBIEN) 5 MG tablet Take 5 mg by mouth at bedtime as needed for sleep.       STOP taking these medications     HYDROcodone-acetaminophen (NORCO/VICODIN) 5-325 MG per tablet        No Known Allergies Follow-up Information   Call [facility].   Specialty:  Pulmonology   Contact information:   [address] [place] [phone]       The results of significant diagnostics from this hospitalization (including imaging, microbiology, ancillary and laboratory) are listed below for reference.    Significant Diagnostic Studies: Dg Chest 2 View  09/27/2013   CLINICAL DATA:  Chest pain. Dry cough for a week. History of pneumothorax.  EXAM: CHEST  2 VIEW  COMPARISON:  09/22/2013.  FINDINGS: Emphysema is present with large bulla at the RIGHT base. Basilar atelectasis. Surgical clips present around the RIGHT hilum. Defect in the RIGHT posterior sixth rib compatible with prior thoracotomy. Tortuous thoracic aorta. Cardiopericardial  silhouette within normal limits for projection.  IMPRESSION: No acute abnormality. Unchanged severe bullous emphysema and postoperative changes of the chest.   Electronically Signed   By: Dereck Ligas [name]   On: 09/27/2013 20:43   Dg Chest 2 View  09/22/2013   CLINICAL DATA:  Right-sided chest pain.  Shortness of breath.  EXAM: CHEST  2 VIEW  COMPARISON:  08/29/2011  FINDINGS: Severe bullous emphysema again demonstrated. No evidence of pneumothorax or pleural effusion. No evidence of pulmonary infiltrate or edema. Heart size remains stable and within normal limits. Tortuosity of thoracic aorta is unchanged. Surgical clips again seen in the right hilum.  IMPRESSION: No active disease.  Severe bullous emphysema.   Electronically Signed   By: Earle Gell [name]   On: 09/22/2013 18:43   Ct Angio Chest W/cm &/or Wo Cm  09/27/2013   CLINICAL DATA:  Chest pain.  EXAM: CT ANGIOGRAPHY CHEST WITH CONTRAST  TECHNIQUE: Multidetector CT imaging of the chest was performed using the standard protocol during bolus administration of intravenous contrast. Multiplanar CT image reconstructions and MIPs were obtained to evaluate the vascular anatomy.  CONTRAST:  166mL OMNIPAQUE IOHEXOL 350 MG/ML SOLN  COMPARISON:  09/24/2009.  FINDINGS: The chest wall is unremarkable.  The heart is normal in size. No pericardial effusion. No mediastinal or hilar mass. There are small right paratracheal and right hilar lymph nodes. The esophagus is grossly normal. There is a small hiatal hernia and mild distal esophageal wall thickening. There is marked tortuosity, ectasia and calcification of the thoracic aorta but no dissection.  The pulmonary arterial tree is fairly well opacified. No filling defects to suggest pulmonary emboli.  Examination of the lung parenchyma demonstrates severe emphysematous changes with large apical bulla. There is a mass in the lingula adjacent to the heart which measures 2.7 x 1.7 cm. This is worrisome for a primary  lung neoplasm. PET-CT suggested for further evaluation. There is a right-sided pleural lipoma. No infiltrates or edema. No pleural effusion.  The upper abdomen is grossly normal.  Review of the MIP images confirms the above findings.  IMPRESSION: 1. No CT findings for pulmonary embolism. 2. Tortuosity, ectasia and calcification of the thoracic aorta but no dissection. 3. Severe bullous emphysema. 4. Soft tissue mass in the lingula adjacent to the heart could be compressive atelectasis but suspicious for pulmonary neoplasm. PET-CT suggested for further evaluation.   Electronically Signed   By: Kalman Jewels [name]   On: 09/27/2013 23:14    Microbiology: No results found for this or any previous visit (from the past 240 hour(s)).   Labs: Basic Metabolic Panel:  Recent Labs Lab 09/22/13 1650 09/27/13 1848 09/28/13 0720  NA 139 137 139  K 4.0 3.9 3.7  CL 100 101 103  CO2 25 24 27   GLUCOSE 132* 136* 127*  BUN 16 13 10   CREATININE 1.23 1.18 1.00  CALCIUM 9.2 8.8 8.7   Liver Function Tests:  Recent Labs Lab 09/28/13 0720  AST 13  ALT 12  ALKPHOS 101  BILITOT 0.3  PROT 7.0  ALBUMIN 3.1*   No results found for this basename: LIPASE, AMYLASE,  in the last 168 hours No results found for this basename: AMMONIA,  in the last 168 hours CBC:  Recent Labs Lab 09/22/13 1848 09/27/13 1848  WBC 9.4 9.3  NEUTROABS 4.7 4.8  HGB 15.3 15.4  HCT 44.1 44.9  MCV 86.5 87.4  PLT 217 222   Cardiac Enzymes:  Recent Labs Lab 09/28/13 0720 09/28/13 1315 09/28/13 1928  TROPONINI <0.30 <0.30 <0.30   BNP: BNP (last 3 results)  Recent Labs  09/27/13 1848  PROBNP 36.0   CBG:  Recent Labs Lab 09/28/13 0811 09/28/13 1212 09/28/13 1731 09/29/13 0155 09/29/13 0747  GLUCAP 109* 112* 121* 119* 117*       Signed:  VANN, JESSICA  [facility] 09/29/2013, 9:24 AM

## 2013-09-30 ENCOUNTER — Encounter (HOSPITAL_COMMUNITY): Payer: Self-pay | Admitting: Emergency Medicine

## 2013-09-30 ENCOUNTER — Emergency Department (HOSPITAL_COMMUNITY): Payer: Medicare Other

## 2013-09-30 ENCOUNTER — Emergency Department (HOSPITAL_COMMUNITY)
Admission: EM | Admit: 2013-09-30 | Discharge: 2013-09-30 | Disposition: A | Discharge status: Home / Self Care | Payer: Medicare Other | Attending: Emergency Medicine | Admitting: Emergency Medicine

## 2013-09-30 DIAGNOSIS — Z8739 Personal history of other diseases of the musculoskeletal system and connective tissue: Secondary | ICD-10-CM | POA: Diagnosis not present

## 2013-09-30 DIAGNOSIS — Z8673 Personal history of transient ischemic attack (TIA), and cerebral infarction without residual deficits: Secondary | ICD-10-CM | POA: Diagnosis not present

## 2013-09-30 DIAGNOSIS — J449 Chronic obstructive pulmonary disease, unspecified: Secondary | ICD-10-CM | POA: Diagnosis not present

## 2013-09-30 DIAGNOSIS — Z87448 Personal history of other diseases of urinary system: Secondary | ICD-10-CM | POA: Diagnosis not present

## 2013-09-30 DIAGNOSIS — R0609 Other forms of dyspnea: Secondary | ICD-10-CM | POA: Insufficient documentation

## 2013-09-30 DIAGNOSIS — Z9889 Other specified postprocedural states: Secondary | ICD-10-CM | POA: Insufficient documentation

## 2013-09-30 DIAGNOSIS — I10 Essential (primary) hypertension: Secondary | ICD-10-CM | POA: Insufficient documentation

## 2013-09-30 DIAGNOSIS — R0989 Other specified symptoms and signs involving the circulatory and respiratory systems: Secondary | ICD-10-CM | POA: Insufficient documentation

## 2013-09-30 DIAGNOSIS — Z7982 Long term (current) use of aspirin: Secondary | ICD-10-CM | POA: Insufficient documentation

## 2013-09-30 DIAGNOSIS — Z79899 Other long term (current) drug therapy: Secondary | ICD-10-CM | POA: Diagnosis not present

## 2013-09-30 DIAGNOSIS — R5381 Other malaise: Secondary | ICD-10-CM | POA: Diagnosis not present

## 2013-09-30 DIAGNOSIS — R5383 Other fatigue: Secondary | ICD-10-CM | POA: Insufficient documentation

## 2013-09-30 DIAGNOSIS — R079 Chest pain, unspecified: Secondary | ICD-10-CM | POA: Diagnosis present

## 2013-09-30 DIAGNOSIS — R0789 Other chest pain: Secondary | ICD-10-CM | POA: Diagnosis not present

## 2013-09-30 DIAGNOSIS — F17200 Nicotine dependence, unspecified, uncomplicated: Secondary | ICD-10-CM | POA: Diagnosis not present

## 2013-09-30 DIAGNOSIS — I509 Heart failure, unspecified: Secondary | ICD-10-CM | POA: Diagnosis not present

## 2013-09-30 DIAGNOSIS — J4489 Other specified chronic obstructive pulmonary disease: Secondary | ICD-10-CM | POA: Insufficient documentation

## 2013-09-30 LAB — COMPREHENSIVE METABOLIC PANEL
ALT: 33 U/L (ref 0–53)
AST: 29 U/L (ref 0–37)
Albumin: 3.6 g/dL (ref 3.5–5.2)
Alkaline Phosphatase: 143 U/L — ABNORMAL HIGH (ref 39–117)
Anion gap: 12 (ref 5–15)
BUN: 16 mg/dL (ref 6–23)
CO2: 26 mEq/L (ref 19–32)
Calcium: 9.1 mg/dL (ref 8.4–10.5)
Chloride: 95 mEq/L — ABNORMAL LOW (ref 96–112)
Creatinine, Ser: 1.18 mg/dL (ref 0.50–1.35)
GFR calc Af Amer: 74 mL/min — ABNORMAL LOW (ref >90)
GFR calc non Af Amer: 64 mL/min — ABNORMAL LOW (ref >90)
Glucose, Bld: 138 mg/dL — ABNORMAL HIGH (ref 70–99)
Potassium: 3.6 mEq/L — ABNORMAL LOW (ref 3.7–5.3)
Sodium: 133 mEq/L — ABNORMAL LOW (ref 137–147)
Total Bilirubin: 0.5 mg/dL (ref 0.3–1.2)
Total Protein: 7.8 g/dL (ref 6.0–8.3)

## 2013-09-30 LAB — CBC
HCT: 43.3 % (ref 39.0–52.0)
Hemoglobin: 15.1 g/dL (ref 13.0–17.0)
MCH: 29.4 pg (ref 26.0–34.0)
MCHC: 34.9 g/dL (ref 30.0–36.0)
MCV: 84.4 fL (ref 78.0–100.0)
Platelets: 186 10*3/uL (ref 150–400)
RBC: 5.13 MIL/uL (ref 4.22–5.81)
RDW: 14 % (ref 11.5–15.5)
WBC: 7.4 10*3/uL (ref 4.0–10.5)

## 2013-09-30 LAB — I-STAT TROPONIN, ED
Troponin i, poc: 0 ng/mL (ref 0.00–0.08)
Troponin i, poc: 0 ng/mL (ref 0.00–0.08)

## 2013-09-30 MED ORDER — FENTANYL CITRATE 0.05 MG/ML IJ SOLN
50.0000 ug | Freq: Once | INTRAMUSCULAR | Status: AC
  Administered 2013-09-30: 50 ug via INTRAVENOUS
  Filled 2013-09-30: qty 2

## 2013-09-30 MED ORDER — HYDROMORPHONE HCL PF 1 MG/ML IJ SOLN: 1.0000 mg | Freq: Once | INTRAMUSCULAR | Status: DC

## 2013-09-30 MED ORDER — SODIUM CHLORIDE 0.9 % IV SOLN
Freq: Once | INTRAVENOUS | Status: AC
  Administered 2013-09-30: 15:00:00 via INTRAVENOUS

## 2013-09-30 MED ORDER — KETOROLAC TROMETHAMINE 30 MG/ML IJ SOLN
30.0000 mg | Freq: Once | INTRAMUSCULAR | Status: AC
  Administered 2013-09-30: 30 mg via INTRAVENOUS
  Filled 2013-09-30: qty 1

## 2013-09-30 MED ORDER — HYDROMORPHONE HCL 2 MG PO TABS: 1.0000 mg | ORAL_TABLET | ORAL | Status: DC | PRN

## 2013-09-30 NOTE — ED Provider Notes (Signed)
CSN: [id number]     Arrival date & time 09/30/13  0931 History   First MD Initiated Contact with Patient 09/30/13 1012     Chief Complaint  Patient presents with  . Chest Pain     (Consider location/radiation/quality/duration/timing/severity/associated sxs/prior Treatment) HPI Patient presents one day after being discharged from this facility with persistent right-sided chest pain. The pain is nonradiating, severe, sore, focally in the right upper chest.  There is associated dyspnea, no new fever, chills, vomiting, the patient is nauseous. Patient also complains of generalized weakness. No relief with home and narcotic use. No new confusion, disorientation, falling.  Past Medical History  Diagnosis Date  . Hypertension   . Smoker   . History of CVA (cerebrovascular accident) 02-22-2011    LEFT BRAIN STEM PONTINE HEMORRHAGE--  RIGHT SIDED WEAKNESS  . History of CHF (congestive heart failure) MONITORED BY DR BOUSKA    DIASTOLIC  . Hydrocele, left   . Chronic cough   . Short of breath on exertion   . Harsh voice quality     PT STATES NORMAL FOR HIM  . COPD, severe     PT DENIES REGULAR DAILY INHALER ANY PRN  . Pre-operative clearance     GIVEN BY DR Coletta Memos  . Weakness of right side of body     S/P CVA   FEB 2013   Past Surgical History  Procedure Laterality Date  . Thoracotomy/lobectomy Right 03-06-2008    AND STAPLING OF BLEBS FOR SPONTANOUS PNEUMOTHORAX  . Cardiac catheterization  05-02-2011  DR NISHAN    MILD NONOBSTRUCTIVE CAD/ LAD, OM , AND D1/ EF 60^  . Cardiac catheterization  AUG 2000    NON-OBSTRUTIVE CAD/ EF 68%/ 25% PROXIMAL LAD/ 20% MID CIRCUMFLEX  . Transthoracic echocardiogram  02-17-2011    MODERATE LVH/ LVSF NORMAL/ EF 27-03%/ GRADE I DIASTOLIC DYSFUNCTION  . Inguinal hernia repair Left   . Hydrocele excision Left 04/24/2012    Procedure: HYDROCELECTOMY ADULT;  Surgeon: Fredricka Bonine, MD;  Location: [facility];  Service: Urology;   Laterality: Left;  90 mins req for this case      Family History  Problem Relation Age of Onset  . Hypertension Mother   . Lung disease Father     died of "colapsed lung" per patient   History  Substance Use Topics  . Smoking status: Current Every Day Smoker -- 1.00 packs/day for 48 years    Types: Cigarettes  . Smokeless tobacco: Never Used  . Alcohol Use: Yes     Comment: OCCASIONAL--      Review of Systems  Constitutional:       Per HPI, otherwise negative  HENT:       Per HPI, otherwise negative  Respiratory:       Per HPI, otherwise negative  Cardiovascular:       Per HPI, otherwise negative  Gastrointestinal: Negative for vomiting.  Endocrine:       Negative aside from HPI  Genitourinary:       Neg aside from HPI   Musculoskeletal:       Per HPI, otherwise negative  Skin: Negative.   Neurological: Negative for syncope.      Allergies  Review of patient's allergies indicates no known allergies.  Home Medications   Prior to Admission medications   Medication Sig Start Date End Date Taking? Authorizing Provider  albuterol (PROVENTIL HFA;VENTOLIN HFA) 108 (90 BASE) MCG/ACT inhaler Inhale 2 puffs into the lungs every 6 (  six) hours as needed (for copd).     Historical Provider, MD  albuterol (PROVENTIL) (2.5 MG/3ML) 0.083% nebulizer solution Take 3 mLs (2.5 mg total) by nebulization every 6 (six) hours as needed for wheezing or shortness of breath. 09/29/13   Geradine Girt, DO  aspirin EC 325 MG tablet Take 325 mg by mouth daily.    Historical Provider, MD  atorvastatin (LIPITOR) 10 MG tablet Take 5 mg by mouth daily.    Historical Provider, MD  cloNIDine (CATAPRES) 0.3 MG tablet Take 0.3 mg by mouth 2 (two) times daily.    Historical Provider, MD  gabapentin (NEURONTIN) 400 MG capsule Take 800 mg by mouth 3 (three) times daily.    Historical Provider, MD  hydrochlorothiazide (HYDRODIURIL) 25 MG tablet Take 25 mg by mouth daily.    Historical Provider, MD    lisinopril (PRINIVIL,ZESTRIL) 20 MG tablet Take 20 mg by mouth 2 (two) times daily.    Historical Provider, MD  metFORMIN (GLUCOPHAGE) 500 MG tablet Take 500 mg by mouth daily with breakfast.    Historical Provider, MD  metoprolol succinate (TOPROL-XL) 100 MG 24 hr tablet Take 50 mg by mouth daily. Take with or immediately following a meal.    Historical Provider, MD  morphine (MS CONTIN) 15 MG 12 hr tablet Take 1 tablet (15 mg total) by mouth every 12 (twelve) hours. 09/29/13   Geradine Girt, DO  nicotine (NICODERM CQ - DOSED IN MG/24 HOURS) 21 mg/24hr patch Place 1 patch (21 mg total) onto the skin daily. 09/29/13   Geradine Girt, DO  nortriptyline (PAMELOR) 25 MG capsule Take 25 mg by mouth at bedtime.    Historical Provider, MD  oxyCODONE-acetaminophen (PERCOCET) 7.5-325 MG per tablet Take 1 tablet by mouth every 6 (six) hours as needed for pain. 09/29/13   Geradine Girt, DO  vitamin B-12 (CYANOCOBALAMIN) 500 MCG tablet Take 500 mcg by mouth daily.    Historical Provider, MD  zolpidem (AMBIEN) 5 MG tablet Take 5 mg by mouth at bedtime as needed for sleep.  09/04/13   Historical Provider, MD   BP 127/72  Pulse 60  Temp(Src) 98.6 F (37 C) (Oral)  Resp 18  SpO2 98% Physical Exam  Nursing note and vitals reviewed. Constitutional: He is oriented to person, place, and time. He appears well-developed. No distress.  HENT:  Head: Normocephalic and atraumatic.  Eyes: Conjunctivae and EOM are normal.  Cardiovascular: Normal rate and regular rhythm.   Pulmonary/Chest: Effort normal. No stridor. No respiratory distress.    Abdominal: He exhibits no distension.  Musculoskeletal: He exhibits no edema.  Neurological: He is alert and oriented to person, place, and time.  Skin: Skin is warm and dry.  Psychiatric: He has a normal mood and affect.    ED Course  Procedures (including critical care time) Labs Review Labs Reviewed  COMPREHENSIVE METABOLIC PANEL - Abnormal; Notable for the  following:    Sodium 133 (*)    Potassium 3.6 (*)    Chloride 95 (*)    Glucose, Bld 138 (*)    Alkaline Phosphatase 143 (*)    GFR calc non Af Amer 64 (*)    GFR calc Af Amer 74 (*)    All other components within normal limits  CBC  I-STAT TROPOININ, ED  Randolm Idol, ED    Imaging Review Dg Chest 2 View  09/30/2013   CLINICAL DATA:  Chest pain.  EXAM: CHEST  2 VIEW  COMPARISON:  September 27, 2013.  FINDINGS: Cardiomediastinal silhouette appears normal. Stable emphysematous disease is noted in both upper lobes with hyperexpansion lungs consistent with chronic obstructive pulmonary disease. No pneumothorax or pleural effusion is noted. Stable scarring is noted in both lung bases. No acute cardiopulmonary abnormality seen.  IMPRESSION: No acute cardiopulmonary abnormality seen. Findings consistent with chronic obstructive pulmonary disease.   Electronically Signed   By: Sabino Dick [name]   On: 09/30/2013 10:47     EKG Interpretation   Date/Time:  Monday September 30 2013 09:39:32 EDT Ventricular Rate:  68 PR Interval:  166 QRS Duration: 98 QT Interval:  384 QTC Calculation: 408 R Axis:   -57 Text Interpretation:  Normal sinus rhythm Left anterior fascicular block  Nonspecific T wave abnormality Abnormal ECG Sinus rhythm Artifact T wave  abnormality Abnormal ekg Confirmed by Carmin Muskrat  MD [phone]) on  09/30/2013 10:16:49 AM     Review of patient's chart demonstrates discharge yesterday, CT scan 3 days ago, with findings in the left lung concerning for possible neoplasm requiring additional evaluation.  I discussed patient's case with radiology, then with outpatient radiology in an effort to expedite his procedure. This procedures not available as an inpatient. On exam the patient remains in similar condition, with decreased pain, there was mild nausea possibly from his narcotic.   MDM   Patient presents a visit for discharge, following an admission for chest pain,  during which his son have a possible neoplasm. Today's evaluation is largely reassuring, with unremarkable labs, stable vital signs, and on exam the patient is breathing spontaneously, without any evidence for distress. With reassuring findings, though the patient does have need for further evaluation and management, patient was discharged in stable condition to his request, do to him and the patient's this afternoon. She will followup as previously scheduled for outpatient CT/PET scan  Carmin Muskrat, MD 09/30/13 1503

## 2013-09-30 NOTE — ED Notes (Signed)
MD Vanita Panda advised that pt inquiring about PET scan and that pt

## 2013-09-30 NOTE — Discharge Instructions (Signed)
As discussed, your evaluation today has been largely reassuring.  But, it is important that you monitor your condition carefully, and do not hesitate to return to the ED if you develop new, or concerning changes in your condition.  Please use the provided medication and you of your previous medication for pain relief.  Do not take all of the medication at the same time, as this may be life threatening.  Otherwise, please follow-up with your physician for appropriate ongoing care.  Chest Pain (Nonspecific) It is often hard to give a specific diagnosis for the cause of chest pain. There is always a chance that your pain could be related to something serious, such as a heart attack or a blood clot in the lungs. You need to follow up with your health care provider for further evaluation. CAUSES   Heartburn.  Pneumonia or bronchitis.  Anxiety or stress.  Inflammation around your heart (pericarditis) or lung (pleuritis or pleurisy).  A blood clot in the lung.  A collapsed lung (pneumothorax). It can develop suddenly on its own (spontaneous pneumothorax) or from trauma to the chest.  Shingles infection (herpes zoster virus). The chest wall is composed of bones, muscles, and cartilage. Any of these can be the source of the pain.  The bones can be bruised by injury.  The muscles or cartilage can be strained by coughing or overwork.  The cartilage can be affected by inflammation and become sore (costochondritis). DIAGNOSIS  Lab tests or other studies may be needed to find the cause of your pain. Your health care provider may have you take a test called an ambulatory electrocardiogram (ECG). An ECG records your heartbeat patterns over a 24-hour period. You may also have other tests, such as:  Transthoracic echocardiogram (TTE). During echocardiography, sound waves are used to evaluate how blood flows through your heart.  Transesophageal echocardiogram (TEE).  Cardiac monitoring. This  allows your health care provider to monitor your heart rate and rhythm in real time.  Holter monitor. This is a portable device that records your heartbeat and can help diagnose heart arrhythmias. It allows your health care provider to track your heart activity for several days, if needed.  Stress tests by exercise or by giving medicine that makes the heart beat faster. TREATMENT   Treatment depends on what may be causing your chest pain. Treatment may include:  Acid blockers for heartburn.  Anti-inflammatory medicine.  Pain medicine for inflammatory conditions.  Antibiotics if an infection is present.  You may be advised to change lifestyle habits. This includes stopping smoking and avoiding alcohol, caffeine, and chocolate.  You may be advised to keep your head raised (elevated) when sleeping. This reduces the chance of acid going backward from your stomach into your esophagus. Most of the time, nonspecific chest pain will improve within 2-3 days with rest and mild pain medicine.  HOME CARE INSTRUCTIONS   If antibiotics were prescribed, take them as directed. Finish them even if you start to feel better.  For the next few days, avoid physical activities that bring on chest pain. Continue physical activities as directed.  Do not use any tobacco products, including cigarettes, chewing tobacco, or electronic cigarettes.  Avoid drinking alcohol.  Only take medicine as directed by your health care provider.  Follow your health care provider's suggestions for further testing if your chest pain does not go away.  Keep any follow-up appointments you made. If you do not go to an appointment, you could develop lasting (chronic)  problems with pain. If there is any problem keeping an appointment, call to reschedule. SEEK MEDICAL CARE IF:   Your chest pain does not go away, even after treatment.  You have a rash with blisters on your chest.  You have a fever. SEEK IMMEDIATE MEDICAL  CARE IF:   You have increased chest pain or pain that spreads to your arm, neck, jaw, back, or abdomen.  You have shortness of breath.  You have an increasing cough, or you cough up blood.  You have severe back or abdominal pain.  You feel nauseous or vomit.  You have severe weakness.  You faint.  You have chills. This is an emergency. Do not wait to see if the pain will go away. Get medical help at once. Call your local emergency services (911 in [place] Do not drive yourself to the hospital. MAKE SURE YOU:   Understand these instructions.  Will watch your condition.  Will get help right away if you are not doing well or get worse. Document Released: 10/13/2004 Document Revised: 01/08/2013 Document Reviewed: 08/09/2007 [facility] Patient Information 2015 [place], [place]. This information is not intended to replace advice given to you by your health care provider. Make sure you discuss any questions you have with your health care provider.

## 2013-09-30 NOTE — ED Notes (Signed)
Dr.Lockwood at bedside  

## 2013-09-30 NOTE — ED Notes (Addendum)
Just d/c from hosptal yesterday Cp x 10 days was seen here and admitted  and told to have special tests but cannot get it till  9/22  Woke up at 630 this am and pain was bad hurts to breath.

## 2013-10-04 ENCOUNTER — Emergency Department (HOSPITAL_COMMUNITY): Payer: Medicare Other

## 2013-10-04 ENCOUNTER — Emergency Department (HOSPITAL_COMMUNITY)
Admission: EM | Admit: 2013-10-04 | Discharge: 2013-10-04 | Disposition: A | Discharge status: Home / Self Care | Payer: Medicare Other | Attending: Emergency Medicine | Admitting: Emergency Medicine

## 2013-10-04 ENCOUNTER — Encounter (HOSPITAL_COMMUNITY): Payer: Self-pay | Admitting: Emergency Medicine

## 2013-10-04 DIAGNOSIS — Z9889 Other specified postprocedural states: Secondary | ICD-10-CM | POA: Insufficient documentation

## 2013-10-04 DIAGNOSIS — F17200 Nicotine dependence, unspecified, uncomplicated: Secondary | ICD-10-CM | POA: Insufficient documentation

## 2013-10-04 DIAGNOSIS — R0602 Shortness of breath: Secondary | ICD-10-CM | POA: Insufficient documentation

## 2013-10-04 DIAGNOSIS — J441 Chronic obstructive pulmonary disease with (acute) exacerbation: Secondary | ICD-10-CM | POA: Diagnosis not present

## 2013-10-04 DIAGNOSIS — Z8673 Personal history of transient ischemic attack (TIA), and cerebral infarction without residual deficits: Secondary | ICD-10-CM | POA: Insufficient documentation

## 2013-10-04 DIAGNOSIS — R0789 Other chest pain: Secondary | ICD-10-CM | POA: Diagnosis present

## 2013-10-04 DIAGNOSIS — M549 Dorsalgia, unspecified: Secondary | ICD-10-CM | POA: Diagnosis not present

## 2013-10-04 DIAGNOSIS — Z87448 Personal history of other diseases of urinary system: Secondary | ICD-10-CM | POA: Insufficient documentation

## 2013-10-04 DIAGNOSIS — I10 Essential (primary) hypertension: Secondary | ICD-10-CM | POA: Insufficient documentation

## 2013-10-04 DIAGNOSIS — Z79899 Other long term (current) drug therapy: Secondary | ICD-10-CM | POA: Insufficient documentation

## 2013-10-04 DIAGNOSIS — R071 Chest pain on breathing: Secondary | ICD-10-CM | POA: Insufficient documentation

## 2013-10-04 DIAGNOSIS — Z7982 Long term (current) use of aspirin: Secondary | ICD-10-CM | POA: Diagnosis not present

## 2013-10-04 LAB — I-STAT TROPONIN, ED: Troponin i, poc: 0 ng/mL (ref 0.00–0.08)

## 2013-10-04 LAB — PRO B NATRIURETIC PEPTIDE: Pro B Natriuretic peptide (BNP): 96.4 pg/mL (ref 0–125)

## 2013-10-04 LAB — CBC WITH DIFFERENTIAL/PLATELET
Basophils Absolute: 0 10*3/uL (ref 0.0–0.1)
Basophils Relative: 0 % (ref 0–1)
Eosinophils Absolute: 0.2 10*3/uL (ref 0.0–0.7)
Eosinophils Relative: 2 % (ref 0–5)
HCT: 41.5 % (ref 39.0–52.0)
Hemoglobin: 14.2 g/dL (ref 13.0–17.0)
Lymphocytes Relative: 39 % (ref 12–46)
Lymphs Abs: 2.9 10*3/uL (ref 0.7–4.0)
MCH: 29.1 pg (ref 26.0–34.0)
MCHC: 34.2 g/dL (ref 30.0–36.0)
MCV: 85 fL (ref 78.0–100.0)
Monocytes Absolute: 0.7 10*3/uL (ref 0.1–1.0)
Monocytes Relative: 10 % (ref 3–12)
Neutro Abs: 3.7 10*3/uL (ref 1.7–7.7)
Neutrophils Relative %: 49 % (ref 43–77)
Platelets: 213 10*3/uL (ref 150–400)
RBC: 4.88 MIL/uL (ref 4.22–5.81)
RDW: 14 % (ref 11.5–15.5)
WBC: 7.5 10*3/uL (ref 4.0–10.5)

## 2013-10-04 LAB — BASIC METABOLIC PANEL
Anion gap: 10 (ref 5–15)
BUN: 12 mg/dL (ref 6–23)
CO2: 26 mEq/L (ref 19–32)
Calcium: 9.1 mg/dL (ref 8.4–10.5)
Chloride: 99 mEq/L (ref 96–112)
Creatinine, Ser: 1.51 mg/dL — ABNORMAL HIGH (ref 0.50–1.35)
GFR calc Af Amer: 55 mL/min — ABNORMAL LOW (ref >90)
GFR calc non Af Amer: 47 mL/min — ABNORMAL LOW (ref >90)
Glucose, Bld: 114 mg/dL — ABNORMAL HIGH (ref 70–99)
Potassium: 4.3 mEq/L (ref 3.7–5.3)
Sodium: 135 mEq/L — ABNORMAL LOW (ref 137–147)

## 2013-10-04 MED ORDER — CYCLOBENZAPRINE HCL 10 MG PO TABS
10.0000 mg | ORAL_TABLET | ORAL | Status: AC
  Administered 2013-10-04: 10 mg via ORAL
  Filled 2013-10-04: qty 1

## 2013-10-04 MED ORDER — CYCLOBENZAPRINE HCL 10 MG PO TABS: 10.0000 mg | ORAL_TABLET | Freq: Two times a day (BID) | ORAL | Status: DC | PRN

## 2013-10-04 MED ORDER — MORPHINE SULFATE 4 MG/ML IJ SOLN
4.0000 mg | INTRAMUSCULAR | Status: AC
  Administered 2013-10-04: 4 mg via INTRAVENOUS
  Filled 2013-10-04: qty 1

## 2013-10-04 NOTE — ED Provider Notes (Signed)
CSN: [id number]     Arrival date & time 10/04/13  1521 History   First MD Initiated Contact with Patient 10/04/13 2101     Chief Complaint  Patient presents with  . Chest Pain    The paient said he is having SOB and chest pain that starts in his ribs on the right side and radiates to mid chest.  . Shortness of Breath     (Consider location/radiation/quality/duration/timing/severity/associated sxs/prior Treatment) HPI Jonathon [name] 63 y.o. with a pmh of COPD, smoking, CVA and chf presents to the ED for chest pain. Patient has had ongoing chest pain for 3-4 weeks. He was seen and admitted for this pain recently here. CTA PE was neg for pulmonary embolism, bur concerning for a mass that he has a PET scan scheduled for in 3 days. Pain is localized to the right side of his chest. Exacerbated with "moving my right arm and when I sit up", no known relieving factors. Pain is sharp in character. It is moderate in severity. Home dilaudid is helping only minimally. No change in baseline SOB. This pain is not worsened with exertion. No N/V/D. No fever, chills, sweats.   Past Medical History  Diagnosis Date  . Hypertension   . Smoker   . History of CVA (cerebrovascular accident) 02-22-2011    LEFT BRAIN STEM PONTINE HEMORRHAGE--  RIGHT SIDED WEAKNESS  . History of CHF (congestive heart failure) MONITORED BY DR BOUSKA    DIASTOLIC  . Hydrocele, left   . Chronic cough   . Short of breath on exertion   . Harsh voice quality     PT STATES NORMAL FOR HIM  . COPD, severe     PT DENIES REGULAR DAILY INHALER ANY PRN  . Pre-operative clearance     GIVEN BY DR Coletta Memos  . Weakness of right side of body     S/P CVA   FEB 2013   Past Surgical History  Procedure Laterality Date  . Thoracotomy/lobectomy Right 03-06-2008    AND STAPLING OF BLEBS FOR SPONTANOUS PNEUMOTHORAX  . Cardiac catheterization  05-02-2011  DR NISHAN    MILD NONOBSTRUCTIVE CAD/ LAD, OM , AND D1/ EF 60^  . Cardiac catheterization   AUG 2000    NON-OBSTRUTIVE CAD/ EF 68%/ 25% PROXIMAL LAD/ 20% MID CIRCUMFLEX  . Transthoracic echocardiogram  02-17-2011    MODERATE LVH/ LVSF NORMAL/ EF 93-71%/ GRADE I DIASTOLIC DYSFUNCTION  . Inguinal hernia repair Left   . Hydrocele excision Left 04/24/2012    Procedure: HYDROCELECTOMY ADULT;  Surgeon: Fredricka Bonine, MD;  Location[facility];  Service: Urology;  Laterality: Left;  90 mins req for this case      Family History  Problem Relation Age of Onset  . Hypertension Mother   . Lung disease Father     died of "colapsed lung" per patient   History  Substance Use Topics  . Smoking status: Current Every Day Smoker -- 1.00 packs/day for 48 years    Types: Cigarettes  . Smokeless tobacco: Never Used  . Alcohol Use: Yes     Comment: OCCASIONAL--      Review of Systems  All other systems reviewed and are negative.     Allergies  Review of patient's allergies indicates no known allergies.  Home Medications   Prior to Admission medications   Medication Sig Start Date End Date Taking? Authorizing Provider  albuterol (PROVENTIL HFA;VENTOLIN HFA) 108 (90 BASE) MCG/ACT inhaler Inhale 2 puffs into  the lungs every 6 (six) hours as needed for wheezing or shortness of breath (for copd).    Yes Historical Provider, MD  albuterol (PROVENTIL) (2.5 MG/3ML) 0.083% nebulizer solution Take 3 mLs (2.5 mg total) by nebulization every 6 (six) hours as needed for wheezing or shortness of breath. 09/29/13  Yes Geradine Girt, DO  aspirin EC 325 MG tablet Take 325 mg by mouth daily.   Yes Historical Provider, MD  atorvastatin (LIPITOR) 10 MG tablet Take 5 mg by mouth daily.   Yes Historical Provider, MD  cloNIDine (CATAPRES) 0.3 MG tablet Take 0.3 mg by mouth 2 (two) times daily.   Yes Historical Provider, MD  gabapentin (NEURONTIN) 400 MG capsule Take 800 mg by mouth 3 (three) times daily.   Yes Historical Provider, MD  hydrochlorothiazide (HYDRODIURIL) 25 MG tablet  Take 25 mg by mouth daily.   Yes Historical Provider, MD  HYDROmorphone (DILAUDID) 2 MG tablet Take 0.5 tablets (1 mg total) by mouth every 4 (four) hours as needed for severe pain. 09/30/13  Yes Carmin Muskrat, MD  lisinopril (PRINIVIL,ZESTRIL) 20 MG tablet Take 20 mg by mouth 2 (two) times daily.   Yes Historical Provider, MD  metFORMIN (GLUCOPHAGE) 500 MG tablet Take 500 mg by mouth daily with breakfast.   Yes Historical Provider, MD  metoprolol succinate (TOPROL-XL) 100 MG 24 hr tablet Take 50 mg by mouth daily. Take with or immediately following a meal.   Yes Historical Provider, MD  nortriptyline (PAMELOR) 25 MG capsule Take 25 mg by mouth at bedtime.   Yes Historical Provider, MD  vitamin B-12 (CYANOCOBALAMIN) 500 MCG tablet Take 500 mcg by mouth daily.   Yes Historical Provider, MD  zolpidem (AMBIEN) 5 MG tablet Take 5 mg by mouth at bedtime as needed for sleep.  09/04/13  Yes Historical Provider, MD  cyclobenzaprine (FLEXERIL) 10 MG tablet Take 1 tablet (10 mg total) by mouth 2 (two) times daily as needed for muscle spasms. 10/04/13   Kelby Aline, MD   BP 149/84  Pulse 51  Temp(Src) 97.6 F (36.4 C) (Oral)  Resp 17  SpO2 97% Physical Exam  Nursing note and vitals reviewed. Constitutional: He is oriented to person, place, and time. He appears well-developed and well-nourished. No distress.  HENT:  Head: Normocephalic and atraumatic.  Eyes: Conjunctivae and EOM are normal. Right eye exhibits no discharge. Left eye exhibits no discharge.  Neck: Normal range of motion. Neck supple. No tracheal deviation present.  Cardiovascular: Normal rate, regular rhythm and normal heart sounds.  Exam reveals no friction rub.   No murmur heard. Pulmonary/Chest: Effort normal and breath sounds normal. No stridor. No respiratory distress. He has no wheezes. He has no rales.   He exhibits tenderness.    Abdominal: Soft. He exhibits no distension. There is no tenderness. There is no rebound and no  guarding.  Musculoskeletal:       Thoracic back: He exhibits tenderness.       Back:  Neurological: He is alert and oriented to person, place, and time.  Skin: Skin is warm.  Psychiatric: He has a normal mood and affect.    ED Course  Procedures (including critical care time) Labs Review Labs Reviewed  BASIC METABOLIC PANEL - Abnormal; Notable for the following:    Sodium 135 (*)    Glucose, Bld 114 (*)    Creatinine, Ser 1.51 (*)    GFR calc non Af Amer 47 (*)    GFR calc Af Amer 55 (*)  All other components within normal limits  PRO B NATRIURETIC PEPTIDE  CBC WITH DIFFERENTIAL  I-STAT TROPOININ, ED    Imaging Review Dg Chest 2 View  10/04/2013   CLINICAL DATA:  Three week history of right-sided chest pain radiating into the right on, associated with shortness of breath. Prior history of spontaneous pneumothorax in 2010. Current history of hypertension, diabetes and COPD.  EXAM: CHEST  2 VIEW  COMPARISON:  Two-view chest x-ray 09/30/2013, 09/27/2013, 09/22/2013, 08/29/2011. CTA chest 09/27/2013.  FINDINGS: Postsurgical changes related to right upper lobectomy. Severe bullous emphysematous changes in both lungs. Mediastinal fat accounts for an opacity in the right infrahilar region, as noted on the prior CT, unchanged. Linear scarring in the left lower lobe. No new pulmonary parenchymal abnormality. No pneumothorax. No pleural effusions.  Cardiac silhouette mildly enlarged but stable. Thoracic aorta tortuous, unchanged. Hilar and mediastinal contours otherwise unremarkable. Visualized bony thorax intact.  IMPRESSION: Severe COPD/emphysema. Left lower lobe scarring. No acute cardiopulmonary disease.   Electronically Signed   By: Evangeline Dakin [name]   On: 10/04/2013 22:07     EKG Interpretation None      MDM   Final diagnoses:  Chest wall pain  Mid back pain    Pt with multiple visits for chest pain. Positional and reproducible. LHC in 2014 that not overly suggestive of  clinically sig disease. No PE on recent admission. VSS. NAD. He does perseverate on the mass that is currently being worked up which I discussed with him at length will need to be worked up on an outpatient basis. ECG NSR without any dynamic changes suggestive of ischemia. No resp distress. Equal breath sounds bilaterally, but there is some faint wheezing bilaterally, and he reports usual improvement with his home albuterol. Right thoracic paraspinous muscle ttp with some noted spasm. CXR ne for PNA, PTX, or mediastinal widening, with similar surgical changes compared to previous CXR. BNP neg doubt acute CHF exacerbation. Given the positional nature, reproduction on exam, and previous work ups, this is reassuring and likely MSK. Symptoms improved with 4 mg IV morphine and flexeril. Gave an rx for flexeril as he already has narcotics at home. Plan for PET as scheduled. FU with PCP for mgt of the patient's pain, which he reports he will do.Strong return precautions given for worsening symptoms or any other alarming or concerning symptoms or issues. The patient was in agreement with the treatment plan and I answered all of their questions. The patient was stable for dc. At dc, the patient ambulated without difficulty, was moving all four extremities, symptoms improved, NAD. and AOx4. Care discussed with my attending, Dr. Reather Converse. If performed and available, imaging studies and labs reviewed.     Kelby Aline, MD 10/05/13 [phone]

## 2013-10-04 NOTE — ED Notes (Signed)
The paient said he is having SOB and chest pain that starts in his ribs on the right side and radiates to mid chest.  The patient denies any other symptoms.

## 2013-10-04 NOTE — ED Notes (Signed)
EDP at bedside assessing patient

## 2013-10-04 NOTE — ED Notes (Signed)
Patient transported to X-ray 

## 2013-10-05 NOTE — ED Provider Notes (Signed)
Medical screening examination/treatment/procedure(s) were conducted as a shared visit with non-physician practitioner(s) or resident and myself. I personally evaluated the patient during the encounter and agree with the findings.  I have personally reviewed any xrays and/ or EKG's with the provider and I agree with interpretation.  And patient with recurrent right-sided chest pain similar to multiple previous episodes. Patient recent CT angiogram with a blood clot. Patient has close followup outpatient for possible cancer workup. Patient had unremarkable coronary angiogram recently, reviewed. Patient says pain is worse with movement and position. On exam regular rate and rhythm, lungs overall clear to auscultation, well-appearing, abdomen soft nontender. Discussed continued outpatient followup and reasons to return.  Chest pain atypical   Mariea Clonts, MD 10/05/13 [phone]

## 2013-10-08 ENCOUNTER — Encounter: Payer: Self-pay | Admitting: Internal Medicine

## 2013-10-08 ENCOUNTER — Ambulatory Visit (INDEPENDENT_AMBULATORY_CARE_PROVIDER_SITE_OTHER): Payer: Medicare Other | Admitting: Internal Medicine

## 2013-10-08 VITALS — BP 102/60 | HR 55 | Temp 98.5°F | Ht 72.0 in | Wt 220.0 lb

## 2013-10-08 DIAGNOSIS — J449 Chronic obstructive pulmonary disease, unspecified: Secondary | ICD-10-CM

## 2013-10-08 DIAGNOSIS — I10 Essential (primary) hypertension: Secondary | ICD-10-CM

## 2013-10-08 DIAGNOSIS — R9389 Abnormal findings on diagnostic imaging of other specified body structures: Secondary | ICD-10-CM

## 2013-10-08 DIAGNOSIS — R071 Chest pain on breathing: Secondary | ICD-10-CM

## 2013-10-08 DIAGNOSIS — R0789 Other chest pain: Secondary | ICD-10-CM

## 2013-10-08 MED ORDER — IRBESARTAN 150 MG PO TABS: 150.0000 mg | ORAL_TABLET | Freq: Every day | ORAL | Status: DC

## 2013-10-08 NOTE — Patient Instructions (Addendum)
Stop lisinopril Start Avapro(Ibesartan) 150 mg one daily - if blood pressure too high (over 140/90)  can take it twice daily     The pain under you ribs that is working over to the left side is probably gas pain. Treatment consists of avoiding foods that cause gas (especially beans and boiled eggs  and raw vegetables like spinach and salads)  and citrucel powder in the tall can take 1 heaping tsp twice daily with a large glass of water.  Pain should improve w/in 2 weeks       Please see patient coordinator before you leave today  to sign for records from [facility] in 2005   Please schedule a follow up office visit in 4 weeks, sooner if needed cxr and pfts - you may need shoulder evaluation in meantime

## 2013-10-08 NOTE — Progress Notes (Signed)
Subjective:    Patient ID: Jonathon [name], male    DOB: 01/23/1950  MRN: [id number]  HPI  63 yo palestinian  male quit smoking 09/17/13 dx 2010 with R PTX secondary to emphysematous blebs in [place] where underwetn RULobectomy for bullous dz   then felt better but then similar pain around first of sep 2015 with pain ant and post on R > went to ER with ? Lingular mass so referred to pulmonary clinic 10/08/2013 by Dr Coletta Memos   10/08/2013 1st [facility] office visit/ [facility]   Chief Complaint  Patient presents with  . Pulmonary Consult    Self referral. Pt c/o right side pain and SOB x 1 month. Went to ED on 09/27/13 and CT Chest was performed. He states that last night the pain started moving across his chest. He gets SOB when he walks " for a while" and with walking up stairs. He is using albuterol inhaler 3-4 times per wk and neb maybe once per month.   cp better p rx with opioids, very similar to prev cp with ptx but now pain migrating over to L side, better lying flat, not really pleuritic, some cough and hoarseness chronically on acei but "no worse than usual"  Worse pain is with R arm abduction and Int rotation  No obvious other patterns in day to day or daytime variabilty or assoc    chest tightness, subjective wheeze or overt sinus  symptoms. No unusual exp hx or h/o childhood pna/ asthma or knowledge of premature birth.  Sleeping ok without nocturnal  or early am exacerbation  of respiratory  c/o's or need for noct saba. Also denies any obvious fluctuation of symptoms with weather or environmental changes or other aggravating or alleviating factors except as outlined above   Current Medications, Allergies, Complete Past Medical History, Past Surgical History, Family History, and Social History were reviewed in [employer] record.           Review of Systems  Constitutional: Negative for fever, chills, activity change, appetite change and unexpected  weight change.  HENT: Negative for congestion, dental problem, postnasal drip, rhinorrhea, sneezing, sore throat, trouble swallowing and voice change.   Eyes: Negative for visual disturbance.  Respiratory: Positive for shortness of breath. Negative for cough and choking.   Cardiovascular: Positive for chest pain. Negative for leg swelling.  Gastrointestinal: Negative for nausea, vomiting and abdominal pain.  Genitourinary: Negative for difficulty urinating.       Acid heartburn  Musculoskeletal: Negative for arthralgias.  Skin: Negative for rash.  Psychiatric/Behavioral: Negative for behavioral problems and confusion.       Objective:   Physical Exam  Wt Readings from Last 3 Encounters:  10/08/13 220 lb (99.791 kg)  09/29/13 217 lb 11.2 oz (98.748 kg)  09/04/12 204 lb (92.534 kg)      amb very hoarse arabic male nad  HEENT mild turbinate edema.  Oropharynx no thrush or excess pnd or cobblestoning.  No JVD or cervical adenopathy. Mild accessory muscle hypertrophy. Trachea midline, nl thryroid. Chest was hyperinflated by percussion with diminished breath sounds and moderate increased exp time without wheeze. Hoover sign positive at mid inspiration. Regular rate and rhythm without murmur gallop or rub or increase P2 or edema.  Abd: no hsm, nl excursion. Ext warm without cyanosis or clubbing.   Cp is reproduced with R arm abd/ int rotation against resistance.      10/04/13 cxr Severe COPD/emphysema. Left lower lobe  scarring. No acute  cardiopulmonary disease.      Assessment & Plan:

## 2013-10-09 ENCOUNTER — Emergency Department (HOSPITAL_COMMUNITY)
Admission: EM | Admit: 2013-10-09 | Discharge: 2013-10-09 | Disposition: A | Discharge status: Home / Self Care | Payer: Medicare Other | Attending: Emergency Medicine | Admitting: Emergency Medicine

## 2013-10-09 ENCOUNTER — Encounter (HOSPITAL_COMMUNITY): Payer: Self-pay | Admitting: Emergency Medicine

## 2013-10-09 ENCOUNTER — Emergency Department (HOSPITAL_COMMUNITY): Payer: Medicare Other

## 2013-10-09 DIAGNOSIS — Z8673 Personal history of transient ischemic attack (TIA), and cerebral infarction without residual deficits: Secondary | ICD-10-CM | POA: Diagnosis not present

## 2013-10-09 DIAGNOSIS — I10 Essential (primary) hypertension: Secondary | ICD-10-CM | POA: Insufficient documentation

## 2013-10-09 DIAGNOSIS — R0789 Other chest pain: Secondary | ICD-10-CM

## 2013-10-09 DIAGNOSIS — R079 Chest pain, unspecified: Secondary | ICD-10-CM | POA: Diagnosis present

## 2013-10-09 DIAGNOSIS — Z87448 Personal history of other diseases of urinary system: Secondary | ICD-10-CM | POA: Diagnosis not present

## 2013-10-09 DIAGNOSIS — Z9889 Other specified postprocedural states: Secondary | ICD-10-CM | POA: Diagnosis not present

## 2013-10-09 DIAGNOSIS — Z87891 Personal history of nicotine dependence: Secondary | ICD-10-CM | POA: Diagnosis not present

## 2013-10-09 DIAGNOSIS — Z79899 Other long term (current) drug therapy: Secondary | ICD-10-CM | POA: Diagnosis not present

## 2013-10-09 DIAGNOSIS — Z7982 Long term (current) use of aspirin: Secondary | ICD-10-CM | POA: Insufficient documentation

## 2013-10-09 DIAGNOSIS — R9389 Abnormal findings on diagnostic imaging of other specified body structures: Secondary | ICD-10-CM | POA: Insufficient documentation

## 2013-10-09 DIAGNOSIS — I509 Heart failure, unspecified: Secondary | ICD-10-CM | POA: Diagnosis not present

## 2013-10-09 DIAGNOSIS — J441 Chronic obstructive pulmonary disease with (acute) exacerbation: Secondary | ICD-10-CM | POA: Insufficient documentation

## 2013-10-09 LAB — BASIC METABOLIC PANEL
Anion gap: 12 (ref 5–15)
BUN: 12 mg/dL (ref 6–23)
CO2: 26 mEq/L (ref 19–32)
Calcium: 9.1 mg/dL (ref 8.4–10.5)
Chloride: 100 mEq/L (ref 96–112)
Creatinine, Ser: 1.24 mg/dL (ref 0.50–1.35)
GFR calc Af Amer: 70 mL/min — ABNORMAL LOW (ref >90)
GFR calc non Af Amer: 60 mL/min — ABNORMAL LOW (ref >90)
Glucose, Bld: 115 mg/dL — ABNORMAL HIGH (ref 70–99)
Potassium: 4.3 mEq/L (ref 3.7–5.3)
Sodium: 138 mEq/L (ref 137–147)

## 2013-10-09 LAB — CBC
HCT: 42.8 % (ref 39.0–52.0)
Hemoglobin: 14.5 g/dL (ref 13.0–17.0)
MCH: 29.1 pg (ref 26.0–34.0)
MCHC: 33.9 g/dL (ref 30.0–36.0)
MCV: 85.9 fL (ref 78.0–100.0)
Platelets: 230 10*3/uL (ref 150–400)
RBC: 4.98 MIL/uL (ref 4.22–5.81)
RDW: 14.2 % (ref 11.5–15.5)
WBC: 7.1 10*3/uL (ref 4.0–10.5)

## 2013-10-09 LAB — I-STAT TROPONIN, ED: Troponin i, poc: 0 ng/mL (ref 0.00–0.08)

## 2013-10-09 LAB — PRO B NATRIURETIC PEPTIDE: Pro B Natriuretic peptide (BNP): 56.2 pg/mL (ref 0–125)

## 2013-10-09 MED ORDER — HYDROMORPHONE HCL 1 MG/ML IJ SOLN
1.0000 mg | Freq: Once | INTRAMUSCULAR | Status: AC
  Administered 2013-10-09: 1 mg via INTRAVENOUS
  Filled 2013-10-09: qty 1

## 2013-10-09 MED ORDER — METHOCARBAMOL 1000 MG/10ML IJ SOLN
1000.0000 mg | Freq: Once | INTRAMUSCULAR | Status: DC
  Filled 2013-10-09: qty 10

## 2013-10-09 MED ORDER — METHOCARBAMOL 500 MG PO TABS: 500.0000 mg | ORAL_TABLET | Freq: Two times a day (BID) | ORAL | Status: DC | PRN

## 2013-10-09 MED ORDER — NAPROXEN 500 MG PO TABS
500.0000 mg | ORAL_TABLET | Freq: Once | ORAL | Status: AC
  Administered 2013-10-09: 500 mg via ORAL
  Filled 2013-10-09: qty 1

## 2013-10-09 MED ORDER — NAPROXEN 500 MG PO TABS: 500.0000 mg | ORAL_TABLET | Freq: Two times a day (BID) | ORAL | Status: DC

## 2013-10-09 MED ORDER — METHOCARBAMOL 500 MG PO TABS
750.0000 mg | ORAL_TABLET | Freq: Once | ORAL | Status: AC
  Administered 2013-10-09: 750 mg via ORAL
  Filled 2013-10-09: qty 2

## 2013-10-09 NOTE — Assessment & Plan Note (Signed)
He has a large bullous lesion adjacent to the atx on the Left which is not likely causing his predominantly R sided pain but will definitely need f/u and plan to first to do pfts after 4 weeks off acei and go from there.   Cannot r/o malignancy but strongly doubt it

## 2013-10-09 NOTE — ED Notes (Signed)
Reports pain in right chest and back that feels like it did in 2010 when he had a lung collapse.  Pts back is tender to palpation.  No respiratory distress noted.

## 2013-10-09 NOTE — ED Notes (Addendum)
Pt reports hx of right lung collapsed in 2010, reports he is having right sided chest pain like that incident. Pain 10/10. Reports he has been to the ED several times in the last few weeks for chest pain. Today reports chest pain is the worst it has been. 50 year smoking hx, hx CHF and COPD

## 2013-10-09 NOTE — Discharge Instructions (Signed)
Chest Pain (Nonspecific) °It is often hard to give a specific diagnosis for the cause of chest pain. There is always a chance that your pain could be related to something serious, such as a heart attack or a blood clot in the lungs. You need to follow up with your health care provider for further evaluation. °CAUSES  °· Heartburn. °· Pneumonia or bronchitis. °· Anxiety or stress. °· Inflammation around your heart (pericarditis) or lung (pleuritis or pleurisy). °· A blood clot in the lung. °· A collapsed lung (pneumothorax). It can develop suddenly on its own (spontaneous pneumothorax) or from trauma to the chest. °· Shingles infection (herpes zoster virus). °The chest wall is composed of bones, muscles, and cartilage. Any of these can be the source of the pain. °· The bones can be bruised by injury. °· The muscles or cartilage can be strained by coughing or overwork. °· The cartilage can be affected by inflammation and become sore (costochondritis). °DIAGNOSIS  °Lab tests or other studies may be needed to find the cause of your pain. Your health care provider may have you take a test called an ambulatory electrocardiogram (ECG). An ECG records your heartbeat patterns over a 24-hour period. You may also have other tests, such as: °· Transthoracic echocardiogram (TTE). During echocardiography, sound waves are used to evaluate how blood flows through your heart. °· Transesophageal echocardiogram (TEE). °· Cardiac monitoring. This allows your health care provider to monitor your heart rate and rhythm in real time. °· Holter monitor. This is a portable device that records your heartbeat and can help diagnose heart arrhythmias. It allows your health care provider to track your heart activity for several days, if needed. °· Stress tests by exercise or by giving medicine that makes the heart beat faster. °TREATMENT  °· Treatment depends on what may be causing your chest pain. Treatment may include: °¨ Acid blockers for  heartburn. °¨ Anti-inflammatory medicine. °¨ Pain medicine for inflammatory conditions. °¨ Antibiotics if an infection is present. °· You may be advised to change lifestyle habits. This includes stopping smoking and avoiding alcohol, caffeine, and chocolate. °· You may be advised to keep your head raised (elevated) when sleeping. This reduces the chance of acid going backward from your stomach into your esophagus. °Most of the time, nonspecific chest pain will improve within 2-3 days with rest and mild pain medicine.  °HOME CARE INSTRUCTIONS  °· If antibiotics were prescribed, take them as directed. Finish them even if you start to feel better. °· For the next few days, avoid physical activities that bring on chest pain. Continue physical activities as directed. °· Do not use any tobacco products, including cigarettes, chewing tobacco, or electronic cigarettes. °· Avoid drinking alcohol. °· Only take medicine as directed by your health care provider. °· Follow your health care provider's suggestions for further testing if your chest pain does not go away. °· Keep any follow-up appointments you made. If you do not go to an appointment, you could develop lasting (chronic) problems with pain. If there is any problem keeping an appointment, call to reschedule. °SEEK MEDICAL CARE IF:  °· Your chest pain does not go away, even after treatment. °· You have a rash with blisters on your chest. °· You have a fever. °SEEK IMMEDIATE MEDICAL CARE IF:  °· You have increased chest pain or pain that spreads to your arm, neck, jaw, back, or abdomen. °· You have shortness of breath. °· You have an increasing cough, or you cough   up blood.  You have severe back or abdominal pain.  You feel nauseous or vomit.  You have severe weakness.  You faint.  You have chills. This is an emergency. Do not wait to see if the pain will go away. Get medical help at once. Call your local emergency services (911 in [place] Do not drive  yourself to the hospital. MAKE SURE YOU:   Understand these instructions.  Will watch your condition.  Will get help right away if you are not doing well or get worse. Document Released: 10/13/2004 Document Revised: 01/08/2013 Document Reviewed: 08/09/2007 [facility] Patient Information 2015 [place], [place]. This information is not intended to replace advice given to you by your health care provider. Make sure you discuss any questions you have with your health care provider.   You have been evaluated in the ED for your atypical chest pain. Your workup has shown no evidence of acute or emergent pathology. You may followup with your primary care or specialist for further evaluation and management of your chest pain. You may take the Robaxin for your right chest discomfort as well as the Naprosyn.

## 2013-10-09 NOTE — ED Provider Notes (Signed)
CSN: [id number]     Arrival date & time 10/09/13  1750 History   First MD Initiated Contact with Patient 10/09/13 1940     Chief Complaint  Patient presents with  . Chest Pain     (Consider location/radiation/quality/duration/timing/severity/associated sxs/prior Treatment) HPI Philippe A Giampietro is a 63 y.o. male with emphysema, status post lobectomy 2010 cardiac cath in 2000 and 2013, CVA in 2013 is here for evaluation of chronic chest pain. Patient has had right chest pain for the past 4 months it is exacerbated with right arm movement--seen in ED multiple times with recent negative workup. Patient presents today because the pain is much worse than normal. He characterizes the pain as a sharp stabbing sensation in his right chest that radiates through to his back. Sitting still makes the pain better and moving his arm makes the pain much worse. Denies fevers, nausea or vomiting, headaches, abdominal pain, diarrhea or constipation, no new numbness or weakness.   Past Medical History  Diagnosis Date  . Hypertension   . Smoker   . History of CVA (cerebrovascular accident) 02-22-2011    LEFT BRAIN STEM PONTINE HEMORRHAGE--  RIGHT SIDED WEAKNESS  . History of CHF (congestive heart failure) MONITORED BY DR BOUSKA    DIASTOLIC  . Hydrocele, left   . Chronic cough   . Short of breath on exertion   . Harsh voice quality     PT STATES NORMAL FOR HIM  . COPD, severe     PT DENIES REGULAR DAILY INHALER ANY PRN  . Pre-operative clearance     GIVEN BY DR Coletta Memos  . Weakness of right side of body     S/P CVA   FEB 2013   Past Surgical History  Procedure Laterality Date  . Thoracotomy/lobectomy Right 03-06-2008    AND STAPLING OF BLEBS FOR SPONTANOUS PNEUMOTHORAX  . Cardiac catheterization  05-02-2011  DR NISHAN    MILD NONOBSTRUCTIVE CAD/ LAD, OM , AND D1/ EF 60^  . Cardiac catheterization  AUG 2000    NON-OBSTRUTIVE CAD/ EF 68%/ 25% PROXIMAL LAD/ 20% MID CIRCUMFLEX  . Transthoracic  echocardiogram  02-17-2011    MODERATE LVH/ LVSF NORMAL/ EF 27-07%/ GRADE I DIASTOLIC DYSFUNCTION  . Inguinal hernia repair Left   . Hydrocele excision Left 04/24/2012    Procedure: HYDROCELECTOMY ADULT;  Surgeon: Fredricka Bonine, MD;  Location: [facility];  Service: Urology;  Laterality: Left;  90 mins req for this case      Family History  Problem Relation Age of Onset  . Hypertension Mother   . Lung disease Father     died of "colapsed lung" per patient  . Asthma Father    History  Substance Use Topics  . Smoking status: Former Smoker -- 1.00 packs/day for 48 years    Types: Cigarettes    Quit date: 09/17/2013  . Smokeless tobacco: Never Used  . Alcohol Use: Yes     Comment: OCCASIONAL--      Review of Systems  Constitutional: Negative for fever.  HENT: Negative for sore throat.   Eyes: Negative for itching.  Respiratory: Positive for shortness of breath.   Cardiovascular: Positive for chest pain.  Gastrointestinal: Negative for abdominal pain.  Endocrine: Negative for polyuria.  Genitourinary: Negative for dysuria.  Skin: Negative for rash.  Neurological: Negative for headaches.      Allergies  Review of patient's allergies indicates no known allergies.  Home Medications   Prior to Admission medications  Medication Sig Start Date End Date Taking? Authorizing Provider  albuterol (PROVENTIL HFA;VENTOLIN HFA) 108 (90 BASE) MCG/ACT inhaler Inhale 2 puffs into the lungs every 6 (six) hours as needed for wheezing or shortness of breath (for copd).    Yes Historical Provider, MD  albuterol (PROVENTIL) (2.5 MG/3ML) 0.083% nebulizer solution Take 3 mLs (2.5 mg total) by nebulization every 6 (six) hours as needed for wheezing or shortness of breath. 09/29/13  Yes Jessica U Vann, DO  amLODipine (NORVASC) 10 MG tablet Take 10 mg by mouth daily.   Yes Historical Provider, MD  aspirin EC 325 MG tablet Take 325 mg by mouth daily.   Yes Historical  Provider, MD  atorvastatin (LIPITOR) 10 MG tablet Take 5 mg by mouth daily.   Yes Historical Provider, MD  cloNIDine (CATAPRES) 0.3 MG tablet Take 0.3 mg by mouth 2 (two) times daily.   Yes Historical Provider, MD  cyclobenzaprine (FLEXERIL) 10 MG tablet Take 1 tablet (10 mg total) by mouth 2 (two) times daily as needed for muscle spasms. 10/04/13  Yes Kelby Aline, MD  gabapentin (NEURONTIN) 400 MG capsule Take 800 mg by mouth 3 (three) times daily.   Yes Historical Provider, MD  hydrochlorothiazide (HYDRODIURIL) 25 MG tablet Take 25 mg by mouth daily.   Yes Historical Provider, MD  HYDROmorphone (DILAUDID) 2 MG tablet Take 0.5 tablets (1 mg total) by mouth every 4 (four) hours as needed for severe pain. 09/30/13  Yes Carmin Muskrat, MD  labetalol (NORMODYNE) 200 MG tablet Take 200 mg by mouth 2 (two) times daily.   Yes Historical Provider, MD  metFORMIN (GLUCOPHAGE) 500 MG tablet Take 500 mg by mouth daily with breakfast.   Yes Historical Provider, MD  metoprolol succinate (TOPROL-XL) 100 MG 24 hr tablet Take 50 mg by mouth daily. Take with or immediately following a meal.   Yes Historical Provider, MD  nortriptyline (PAMELOR) 25 MG capsule Take 25 mg by mouth at bedtime.   Yes Historical Provider, MD  psyllium (REGULOID) 0.52 G capsule Take 0.52 g by mouth daily.   Yes Historical Provider, MD  vitamin B-12 (CYANOCOBALAMIN) 500 MCG tablet Take 500 mcg by mouth daily.   Yes Historical Provider, MD  zolpidem (AMBIEN) 5 MG tablet Take 5 mg by mouth at bedtime as needed for sleep.  09/04/13  Yes Historical Provider, MD  irbesartan (AVAPRO) 150 MG tablet Take 1 tablet (150 mg total) by mouth daily. 10/08/13   Tanda Rockers, MD  methocarbamol (ROBAXIN) 500 MG tablet Take 1 tablet (500 mg total) by mouth 2 (two) times daily as needed for muscle spasms. 10/09/13   Verl Dicker, PA-C  naproxen (NAPROSYN) 500 MG tablet Take 1 tablet (500 mg total) by mouth 2 (two) times daily with a meal. 10/09/13    Viona Gilmore Eleisha Branscomb, PA-C   BP 146/68  Pulse 62  Temp(Src) 98.3 F (36.8 C) (Oral)  Resp 18  SpO2 98% Physical Exam  Nursing note and vitals reviewed. Constitutional: He is oriented to person, place, and time. He appears well-developed and well-nourished.  HENT:  Head: Normocephalic and atraumatic.  Mouth/Throat: Oropharynx is clear and moist.  Eyes: Conjunctivae are normal. Pupils are equal, round, and reactive to light. Right eye exhibits no discharge. Left eye exhibits no discharge. No scleral icterus.  Neck: Neck supple.  Cardiovascular: Normal rate, regular rhythm and normal heart sounds.   Pulmonary/Chest: Effort normal and breath sounds normal. No respiratory distress. He has no wheezes. He has no rales.  He exhibits tenderness.  Palpation of the inferior right pectoralis major muscle reproduces the patient's discomfort. He states "that's it, that the pain"  Abdominal: Soft. There is no tenderness.  Musculoskeletal: He exhibits no tenderness.  Neurological: He is alert and oriented to person, place, and time.  Cranial Nerves II-XII grossly intact  Skin: Skin is warm and dry. No rash noted.  Psychiatric: He has a normal mood and affect.    ED Course  Procedures (including critical care time) Labs Review Labs Reviewed  BASIC METABOLIC PANEL - Abnormal; Notable for the following:    Glucose, Bld 115 (*)    GFR calc non Af Amer 60 (*)    GFR calc Af Amer 70 (*)    All other components within normal limits  CBC  PRO B NATRIURETIC PEPTIDE  I-STAT TROPOININ, ED    Imaging Review Dg Chest 2 View  10/09/2013   CLINICAL DATA:  Right-sided chest pain and shortness of breath since 2 p.m. today, medicated hypertension, former smoker  EXAM: CHEST  2 VIEW  COMPARISON:  10/04/2013  FINDINGS: Hyperinflation consistent with COPD. Stable mild cardiac enlargement. Right lung is clear. Mild scarring left lung base similar to prior study. Stable uncoiling of the aorta.  IMPRESSION: COPD  with no acute abnormalities   Electronically Signed   By: Skipper Cliche [name]   On: 10/09/2013 21:51     EKG Interpretation   Date/Time:  Wednesday October 09 2013 18:20:49 EDT Ventricular Rate:  56 PR Interval:  177 QRS Duration: 98 QT Interval:  435 QTC Calculation: 420 R Axis:   -22 Text Interpretation:  Sinus rhythm Borderline left axis deviation  Borderline T abnormalities, lateral leads No significant change since last  tracing 04 Oct 2013 Confirmed by [facility]  MD-I, IVA ([id number]) on 10/09/2013  11:16:12 PM     Meds given in ED:  Medications  HYDROmorphone (DILAUDID) injection 1 mg (1 mg Intravenous Given 10/09/13 2110)  naproxen (NAPROSYN) tablet 500 mg (500 mg Oral Given 10/09/13 2303)  methocarbamol (ROBAXIN) tablet 750 mg (750 mg Oral Given 10/09/13 2314)    Discharge Medication List as of 10/09/2013 10:54 PM    START taking these medications   Details  methocarbamol (ROBAXIN) 500 MG tablet Take 1 tablet (500 mg total) by mouth 2 (two) times daily as needed for muscle spasms., Starting 10/09/2013, Until Discontinued, Print    naproxen (NAPROSYN) 500 MG tablet Take 1 tablet (500 mg total) by mouth 2 (two) times daily with a meal., Starting 10/09/2013, Until Discontinued, Print       Filed Vitals:   10/09/13 1813 10/09/13 1938 10/09/13 2305  BP: 125/76 123/77 146/68  Pulse: 58  62  Temp: 98.5 F (36.9 C)  98.3 F (36.8 C)  TempSrc: Oral  Oral  Resp: 18 15 18   SpO2: 96% 97% 98%     MDM  Vitals stable - WNL -afebrile Pt resting comfortably in ED. Pain managed in ED PE-- seems to suggest a musculoskeletal etiology. This appears to be an acute exacerbation of his chronic atypical chest pain with no evidence of source Labwork-- Troponin negative, EKG unchanged, CXR unchanged. Previous workup shows no acute pathology with CXR or CT angiogram.  There is existing COPD, specifically emphysema which may also contribute to his chest discomfort. Will DC with Robaxin and  Naproxen for likely MSK chest pain. Discussed f/u with PCPand/or his pulmonologist/cardiologist as well as return precautions, pt very amenable to plan.  Prior to patient discharge, I  discussed and reviewed this case with Chandra Batch    Final diagnoses:  Atypical chest pain        De Pue, PA-C 10/10/13 [phone]

## 2013-10-09 NOTE — Assessment & Plan Note (Signed)
Clinically probably GOLD III s/p smoking cessation within the last month, the most important aspect of his care at this point  Reinforced abstinence and plan f/u pfts in 4 weeks

## 2013-10-09 NOTE — Assessment & Plan Note (Signed)
No evidence of anything in the lungs that would explain his pain which may all be mscp/ shoulder related and may need ortho input if not improving with conservative rx

## 2013-10-09 NOTE — Assessment & Plan Note (Signed)

## 2013-10-10 NOTE — ED Provider Notes (Signed)
Medical screening examination/treatment/procedure(s) were performed by non-physician practitioner and as supervising physician I was immediately available for consultation/collaboration.   EKG Interpretation   Date/Time:  Wednesday October 09 2013 18:20:49 EDT Ventricular Rate:  56 PR Interval:  177 QRS Duration: 98 QT Interval:  435 QTC Calculation: 420 R Axis:   -22 Text Interpretation:  Sinus rhythm Borderline left axis deviation  Borderline T abnormalities, lateral leads No significant change since last  tracing 04 Oct 2013 Confirmed by [facility]  MD-I, Jenni Thew ([id number]) on 10/09/2013  11:16:12 PM      Rolland Porter, MD, Alanson Aly, MD 10/10/13 [phone]

## 2013-11-05 ENCOUNTER — Encounter: Payer: Self-pay | Admitting: Internal Medicine

## 2013-11-05 ENCOUNTER — Ambulatory Visit (INDEPENDENT_AMBULATORY_CARE_PROVIDER_SITE_OTHER): Payer: Medicare Other | Admitting: Internal Medicine

## 2013-11-05 VITALS — BP 110/70 | HR 60 | Temp 99.3°F | Ht 69.0 in | Wt 216.0 lb

## 2013-11-05 DIAGNOSIS — M25519 Pain in unspecified shoulder: Secondary | ICD-10-CM | POA: Insufficient documentation

## 2013-11-05 DIAGNOSIS — M25511 Pain in right shoulder: Secondary | ICD-10-CM

## 2013-11-05 DIAGNOSIS — I10 Essential (primary) hypertension: Secondary | ICD-10-CM

## 2013-11-05 DIAGNOSIS — R938 Abnormal findings on diagnostic imaging of other specified body structures: Secondary | ICD-10-CM

## 2013-11-05 DIAGNOSIS — R9389 Abnormal findings on diagnostic imaging of other specified body structures: Secondary | ICD-10-CM

## 2013-11-05 DIAGNOSIS — J449 Chronic obstructive pulmonary disease, unspecified: Secondary | ICD-10-CM

## 2013-11-05 DIAGNOSIS — Z720 Tobacco use: Secondary | ICD-10-CM

## 2013-11-05 DIAGNOSIS — F17210 Nicotine dependence, cigarettes, uncomplicated: Secondary | ICD-10-CM

## 2013-11-05 DIAGNOSIS — F17200 Nicotine dependence, unspecified, uncomplicated: Secondary | ICD-10-CM

## 2013-11-05 LAB — PULMONARY FUNCTION TEST
DL/VA % pred: 76 %
DL/VA: 3.51 ml/min/mmHg/L
DLCO unc % pred: 62 %
DLCO unc: 19.48 ml/min/mmHg
FEF 25-75 Post: 1.07 L/sec
FEF 25-75 Pre: 0.61 L/sec
FEF2575-%Change-Post: 76 %
FEF2575-%Pred-Post: 39 %
FEF2575-%Pred-Pre: 22 %
FEV1-%Change-Post: 23 %
FEV1-%Pred-Post: 54 %
FEV1-%Pred-Pre: 44 %
FEV1-Post: 1.84 L
FEV1-Pre: 1.5 L
FEV1FVC-%Change-Post: 0 %
FEV1FVC-%Pred-Pre: 68 %
FEV6-%Change-Post: 20 %
FEV6-%Pred-Post: 77 %
FEV6-%Pred-Pre: 64 %
FEV6-Post: 3.29 L
FEV6-Pre: 2.74 L
FEV6FVC-%Change-Post: -2 %
FEV6FVC-%Pred-Post: 97 %
FEV6FVC-%Pred-Pre: 100 %
FVC-%Change-Post: 22 %
FVC-%Pred-Post: 79 %
FVC-%Pred-Pre: 65 %
FVC-Post: 3.56 L
FVC-Pre: 2.91 L
Post FEV1/FVC ratio: 52 %
Post FEV6/FVC ratio: 93 %
Pre FEV1/FVC ratio: 51 %
Pre FEV6/FVC Ratio: 95 %

## 2013-11-05 NOTE — Assessment & Plan Note (Signed)
-    PFT's 11/05/2013  FEV1 1.84 (54%) ratio 52 p 23% improvement from saba and dlco 62 corrects to 76% on labetolol (d/c'd) - 11/05/2013  Walked RA x 3 laps @ 185 ft each stopped due to  Slow pace/ no desat or sign sob   Although he has copd with ab component, not clear at this point he needs any treatment other than to avoid meds that make him look a lot worse than her really is, namely acei and nonspecific beta blockers  He is thoroughly confused as to how to take the meds he has   To keep things simple, I have asked the patient to first separate medicines that are perceived as maintenance, that is to be taken daily "no matter what", from those medicines that are taken on only on an as-needed basis and I have given the patient examples of both, and then return to see our NP to generate a  detailed  medication calendar which should be followed until the next physician sees the patient and updates it.

## 2013-11-05 NOTE — Assessment & Plan Note (Signed)
S/p RULobectomy 03/07/08 for bullous dz with PTX See CT 09/27/13 1. No CT findings for pulmonary embolism.  2. Tortuosity, ectasia and calcification of the thoracic aorta but  no dissection.  3. Severe bullous emphysema.  4. Soft tissue mass in the lingula adjacent to the heart could be  compressive atelectasis  - placed for recall 12/27/13

## 2013-11-05 NOTE — Progress Notes (Signed)
PFT done today. 

## 2013-11-05 NOTE — Assessment & Plan Note (Signed)
>   3 min discussion I reviewed the Flethcher curve with patient that basically indicates  if you quit smoking when your best day FEV1 is still well preserved (as is still the case here)  it is highly unlikely you will progress to severe disease and informed the patient there was no medication on the market that has proven to change the curve or the likelihood of progression.  Therefore stopping smoking and maintaining abstinence is the most important aspect of care, not choice of inhalers or for that matter, doctors.

## 2013-11-05 NOTE — Assessment & Plan Note (Signed)
Neg resp to nsaids > referred to ortho

## 2013-11-05 NOTE — Progress Notes (Signed)
Subjective:    Patient ID: Jonathon [name], male    DOB: Apr 05, 1950  MRN: [id number]    Brief patient profile:  63 yo palestinian  male smoker  dx 2010 with R PTX secondary [place]sematous [place]bs in [place] where underwent  RULobectomy for bullous dz   then felt better but then similar pain around first of sept 2015 with pain ant and post on R > went to ER with ? Lingular mass so referred to pulmonary clinic 10/08/2013 by Dr Coletta Memos with documented GOLD II copd with reversibility by pfts 11/05/2013     History of Present Illness  10/08/2013 1st [facility] office visit/ Gerrick Ray   Chief Complaint  Patient presents with  . Pulmonary Consult    Self referral. Pt c/o right side pain and SOB x 1 month. Went to ED on 09/27/13 and CT Chest was performed. He states that last night the pain started moving across his chest. He gets SOB when he walks " for a while" and with walking up stairs. He is using albuterol inhaler 3-4 times per wk and neb maybe once per month.   cp better p rx with opioids, very similar to prev cp with ptx but now pain migrating over to L side, better lying flat, not really pleuritic, some cough and hoarseness chronically on acei but "no worse than usual"  Worse pain is with R arm abduction and Int rotation rec Stop lisinopril Start Avapro(Ibesartan) 150 mg one daily - if blood pressure too high (over 140/90)  can take it twice daily  The pain under you ribs that is working over to the left side is probably gas pain. Treatment consists of avoiding foods that cause gas (especially beans and boiled eggs  and raw vegetables like spinach and salads)  and citrucel powder in the tall can take 1 heaping tsp twice daily with a large glass of water.  Pain should improve w/in 2 weeks.   11/05/2013 f/u ov/Carolyn Sylvia re: GOLD II copd with reversibility/ still occ cigarette Chief Complaint  Patient presents with  . Follow-up    Pt states that his breathing has been worse for the past 3  wks. He also c/o "right lung pain"- bothers him when he sleeps on his rt side.  He is using rescue inhaler about 1-2 times per day and has used neb several times since the last visit.   says voice is the same for years Does ok at [employer] but not [employer]   Uses h[facility] sp cva  Very confused with various inhalers Still on labetolol  Uses wife's inhalers but very poor at [facility]/ still smoking  . No obvious day to day or daytime variabilty or assoc chronic cough or cp or chest tightness, subjective wheeze overt sinus or hb symptoms. No unusual exp hx or h/o childhood pna/ asthma or knowledge of premature birth.  Sleeping ok without nocturnal  or early am exacerbation  of respiratory  c/o's or need for noct saba. Also denies any obvious fluctuation of symptoms with weather or environmental changes or other aggravating or alleviating factors except as outlined above   Current Medications, Allergies, Complete Past Medical History, Past Surgical History, Family History, and Social History were reviewed in [employer] record.  ROS  The following are not active complaints unless bolded sore throat, dysphagia, dental problems, itching, sneezing,  nasal congestion or excess/ purulent secretions, ear ache,   fever, chills, sweats, unintended wt loss, pleuritic or exertional  cp, hemoptysis,  orthopnea pnd or leg swelling, presyncope, palpitations, heartburn, abdominal pain, anorexia, nausea, vomiting, diarrhea  or change in bowel or urinary habits, change in stools or urine, dysuria,hematuria,  rash, arthralgias R shoulder, visual complaints, headache, numbness weakness or ataxia or problems with walking or coordination,  change in mood/affect or memory.                        Objective:   Physical Exam  11/05/2013      216  Wt Readings from Last 3 Encounters:  10/08/13 220 lb (99.791 kg)  09/29/13 217 lb 11.2 oz (98.748 kg)  09/04/12 204 lb (92.534 kg)      amb very  hoarse arabic male nad  HEENT mild turbinate edema.  Oropharynx no thrush or excess pnd or cobblestoning.  No JVD or cervical adenopathy. Mild accessory muscle hypertrophy. Trachea midline, nl thryroid. Chest was hyperinflated by percussion with diminished breath sounds and moderate increased exp time without wheeze. Hoover sign positive at mid inspiration. Regular rate and rhythm without murmur gallop or rub or increase P2 or edema.  Abd: no hsm, nl excursion. Ext warm without cyanosis or clubbing.   Cp is reproduced with R arm abd/ int rotation against resistance.     09/27/13 CTa 1. No CT findings for pulmonary embolism.  2. Tortuosity, ectasia and calcification of the thoracic aorta but  no dissection.  3. Severe bullous emphysema.  4. Soft tissue mass in the lingula adjacent to the heart could be  compressive atelectasis   10/04/13 cxr Severe COPD/emphysema. Left lower lobe scarring. No acute  cardiopulmonary disease.      Assessment & Plan:

## 2013-11-05 NOTE — Assessment & Plan Note (Signed)
Changed acei to arb 10/09/2013 due to hoarseness and chronic cough  - d/cd labetolol due to reversible airflow obst 11/05/2013   Will f/u in 2 weeks to assure medical adherence before considering additional measures

## 2013-11-05 NOTE — Patient Instructions (Addendum)
Stop the naprosyn and labetolol  Take your omeprazole 40 mg Take 30- 60 min before your first and last meals of the day   Only use your albuterol(proair or nebulizer) as a rescue medication to be used if you can't catch your breath by resting or doing a relaxed purse lip breathing pattern.  - The less you use it, the better it will work when you need it. - Ok to use up to 2 puffs  every 4 hours if you must       Please see patient coordinator before you leave today  to schedule orthopedic evaluation of your shoulder pain   See Tammy NP w/in 2 weeks with all your medications, even over the counter meds, separated in two separate bags, the ones you take no matter what vs the ones you stop once you feel better and take only as needed when you feel you need them.   Tammy  will generate for you a new user friendly medication calendar that will put [place] all on the same page re: your medication use.     Without this process, it simply isn't possible to assure that we are providing  your outpatient care  with  the attention to detail we feel you deserve.   If we cannot assure that you're getting that kind of care,  then we cannot manage your problem effectively from this clinic.  Once you have seen Tammy and we are sure that we're all on the same page with your medication use she will arrange follow up with me.

## 2013-11-13 ENCOUNTER — Ambulatory Visit: Payer: Medicare Other | Admitting: Physical Therapy

## 2013-11-14 ENCOUNTER — Ambulatory Visit: Payer: Medicare Other | Attending: Physical Therapy | Admitting: Physical Therapy

## 2013-11-14 DIAGNOSIS — F419 Anxiety disorder, unspecified: Secondary | ICD-10-CM | POA: Insufficient documentation

## 2013-11-14 DIAGNOSIS — Z5189 Encounter for other specified aftercare: Secondary | ICD-10-CM | POA: Insufficient documentation

## 2013-11-14 DIAGNOSIS — Z8673 Personal history of transient ischemic attack (TIA), and cerebral infarction without residual deficits: Secondary | ICD-10-CM | POA: Diagnosis not present

## 2013-11-14 DIAGNOSIS — R262 Difficulty in walking, not elsewhere classified: Secondary | ICD-10-CM | POA: Diagnosis not present

## 2013-11-14 DIAGNOSIS — I10 Essential (primary) hypertension: Secondary | ICD-10-CM | POA: Diagnosis not present

## 2013-11-14 DIAGNOSIS — R260 Ataxic gait: Secondary | ICD-10-CM | POA: Insufficient documentation

## 2013-11-14 DIAGNOSIS — J449 Chronic obstructive pulmonary disease, unspecified: Secondary | ICD-10-CM | POA: Insufficient documentation

## 2013-11-18 ENCOUNTER — Ambulatory Visit: Payer: Medicare Other | Attending: Family Medicine | Admitting: Physical Therapy

## 2013-11-18 DIAGNOSIS — J449 Chronic obstructive pulmonary disease, unspecified: Secondary | ICD-10-CM | POA: Diagnosis not present

## 2013-11-18 DIAGNOSIS — Z5189 Encounter for other specified aftercare: Secondary | ICD-10-CM | POA: Diagnosis present

## 2013-11-18 DIAGNOSIS — R262 Difficulty in walking, not elsewhere classified: Secondary | ICD-10-CM | POA: Insufficient documentation

## 2013-11-18 DIAGNOSIS — F419 Anxiety disorder, unspecified: Secondary | ICD-10-CM | POA: Insufficient documentation

## 2013-11-18 DIAGNOSIS — Z8673 Personal history of transient ischemic attack (TIA), and cerebral infarction without residual deficits: Secondary | ICD-10-CM | POA: Diagnosis not present

## 2013-11-18 DIAGNOSIS — R260 Ataxic gait: Secondary | ICD-10-CM | POA: Insufficient documentation

## 2013-11-18 DIAGNOSIS — I10 Essential (primary) hypertension: Secondary | ICD-10-CM | POA: Diagnosis not present

## 2013-11-19 ENCOUNTER — Encounter: Payer: Medicare Other | Admitting: Adult Health

## 2013-11-21 ENCOUNTER — Ambulatory Visit: Payer: Medicare Other | Admitting: Physical Therapy

## 2013-11-21 DIAGNOSIS — Z5189 Encounter for other specified aftercare: Secondary | ICD-10-CM | POA: Diagnosis not present

## 2013-11-25 ENCOUNTER — Ambulatory Visit: Payer: Medicare Other | Admitting: Physical Therapy

## 2013-11-28 ENCOUNTER — Ambulatory Visit: Payer: Medicare Other | Admitting: Physical Therapy

## 2013-11-28 DIAGNOSIS — Z5189 Encounter for other specified aftercare: Secondary | ICD-10-CM | POA: Diagnosis not present

## 2013-12-02 ENCOUNTER — Ambulatory Visit: Payer: Medicare Other | Admitting: Physical Therapy

## 2013-12-09 ENCOUNTER — Ambulatory Visit: Payer: Medicare Other | Admitting: Physical Therapy

## 2013-12-09 DIAGNOSIS — Z5189 Encounter for other specified aftercare: Secondary | ICD-10-CM | POA: Diagnosis not present

## 2013-12-11 ENCOUNTER — Encounter: Payer: Self-pay | Admitting: Gastroenterology

## 2013-12-16 ENCOUNTER — Telehealth: Payer: Self-pay | Admitting: *Deleted

## 2013-12-16 ENCOUNTER — Ambulatory Visit: Payer: Medicare Other | Admitting: Physical Therapy

## 2013-12-16 DIAGNOSIS — R9389 Abnormal findings on diagnostic imaging of other specified body structures: Secondary | ICD-10-CM

## 2013-12-16 DIAGNOSIS — Z5189 Encounter for other specified aftercare: Secondary | ICD-10-CM | POA: Diagnosis not present

## 2013-12-16 NOTE — Telephone Encounter (Signed)
Spoke with the pt and reminded him that he is due to have CT Chest  He verbalized understanding  Order was sent to [facility]

## 2013-12-16 NOTE — Telephone Encounter (Signed)
-----   Message from Tanda Rockers, MD sent at 11/05/2013  9:45 PM EDT ----- Ct chest no contrast

## 2013-12-18 ENCOUNTER — Ambulatory Visit: Payer: Medicare Other | Admitting: Physical Therapy

## 2013-12-19 ENCOUNTER — Ambulatory Visit: Payer: Medicare Other | Attending: Family Medicine | Admitting: Physical Therapy

## 2013-12-19 DIAGNOSIS — I10 Essential (primary) hypertension: Secondary | ICD-10-CM | POA: Diagnosis not present

## 2013-12-19 DIAGNOSIS — R260 Ataxic gait: Secondary | ICD-10-CM | POA: Insufficient documentation

## 2013-12-19 DIAGNOSIS — Z8673 Personal history of transient ischemic attack (TIA), and cerebral infarction without residual deficits: Secondary | ICD-10-CM | POA: Insufficient documentation

## 2013-12-19 DIAGNOSIS — R262 Difficulty in walking, not elsewhere classified: Secondary | ICD-10-CM | POA: Insufficient documentation

## 2013-12-19 DIAGNOSIS — J449 Chronic obstructive pulmonary disease, unspecified: Secondary | ICD-10-CM | POA: Diagnosis not present

## 2013-12-19 DIAGNOSIS — Z5189 Encounter for other specified aftercare: Secondary | ICD-10-CM | POA: Diagnosis present

## 2013-12-19 DIAGNOSIS — F419 Anxiety disorder, unspecified: Secondary | ICD-10-CM | POA: Diagnosis not present

## 2013-12-23 ENCOUNTER — Ambulatory Visit: Payer: Medicare Other | Admitting: Physical Therapy

## 2013-12-25 ENCOUNTER — Ambulatory Visit (INDEPENDENT_AMBULATORY_CARE_PROVIDER_SITE_OTHER)
Admission: RE | Admit: 2013-12-25 | Discharge: 2013-12-25 | Disposition: A | Discharge status: Home / Self Care | Payer: Medicare Other | Source: Ambulatory Visit | Attending: Internal Medicine | Admitting: Internal Medicine

## 2013-12-25 DIAGNOSIS — R9389 Abnormal findings on diagnostic imaging of other specified body structures: Secondary | ICD-10-CM

## 2013-12-25 DIAGNOSIS — R938 Abnormal findings on diagnostic imaging of other specified body structures: Secondary | ICD-10-CM

## 2013-12-26 ENCOUNTER — Encounter (HOSPITAL_COMMUNITY): Payer: Self-pay | Admitting: Cardiovascular Disease

## 2013-12-26 ENCOUNTER — Ambulatory Visit: Payer: Medicare Other | Admitting: Physical Therapy

## 2013-12-26 DIAGNOSIS — Z5189 Encounter for other specified aftercare: Secondary | ICD-10-CM | POA: Diagnosis not present

## 2013-12-30 ENCOUNTER — Ambulatory Visit: Payer: Medicare Other | Admitting: Physical Therapy

## 2013-12-30 DIAGNOSIS — Z5189 Encounter for other specified aftercare: Secondary | ICD-10-CM | POA: Diagnosis not present

## 2014-01-02 ENCOUNTER — Ambulatory Visit: Payer: Medicare Other | Admitting: Physical Therapy

## 2014-01-02 DIAGNOSIS — Z5189 Encounter for other specified aftercare: Secondary | ICD-10-CM | POA: Diagnosis not present

## 2014-01-06 ENCOUNTER — Ambulatory Visit: Payer: Medicare Other | Admitting: Physical Therapy

## 2014-01-08 ENCOUNTER — Ambulatory Visit: Payer: Medicare Other | Admitting: Physical Therapy

## 2014-01-08 DIAGNOSIS — Z5189 Encounter for other specified aftercare: Secondary | ICD-10-CM | POA: Diagnosis not present

## 2014-01-13 ENCOUNTER — Emergency Department (HOSPITAL_COMMUNITY)
Admission: EM | Admit: 2014-01-13 | Discharge: 2014-01-13 | Disposition: A | Discharge status: Home / Self Care | Payer: Medicare Other | Attending: Cardiology | Admitting: Cardiology

## 2014-01-13 ENCOUNTER — Encounter (HOSPITAL_COMMUNITY): Payer: Self-pay | Admitting: Emergency Medicine

## 2014-01-13 ENCOUNTER — Emergency Department (HOSPITAL_COMMUNITY): Payer: Medicare Other

## 2014-01-13 ENCOUNTER — Ambulatory Visit: Payer: Medicare Other | Admitting: Physical Therapy

## 2014-01-13 DIAGNOSIS — I10 Essential (primary) hypertension: Secondary | ICD-10-CM | POA: Diagnosis not present

## 2014-01-13 DIAGNOSIS — I509 Heart failure, unspecified: Secondary | ICD-10-CM | POA: Insufficient documentation

## 2014-01-13 DIAGNOSIS — Z791 Long term (current) use of non-steroidal anti-inflammatories (NSAID): Secondary | ICD-10-CM | POA: Diagnosis not present

## 2014-01-13 DIAGNOSIS — J441 Chronic obstructive pulmonary disease with (acute) exacerbation: Secondary | ICD-10-CM | POA: Diagnosis not present

## 2014-01-13 DIAGNOSIS — Z79899 Other long term (current) drug therapy: Secondary | ICD-10-CM | POA: Insufficient documentation

## 2014-01-13 DIAGNOSIS — Z8673 Personal history of transient ischemic attack (TIA), and cerebral infarction without residual deficits: Secondary | ICD-10-CM | POA: Diagnosis not present

## 2014-01-13 DIAGNOSIS — R079 Chest pain, unspecified: Secondary | ICD-10-CM

## 2014-01-13 DIAGNOSIS — Z720 Tobacco use: Secondary | ICD-10-CM | POA: Insufficient documentation

## 2014-01-13 LAB — CBC
HCT: 47.5 % (ref 39.0–52.0)
Hemoglobin: 16.2 g/dL (ref 13.0–17.0)
MCH: 29.5 pg (ref 26.0–34.0)
MCHC: 34.1 g/dL (ref 30.0–36.0)
MCV: 86.5 fL (ref 78.0–100.0)
Platelets: 229 10*3/uL (ref 150–400)
RBC: 5.49 MIL/uL (ref 4.22–5.81)
RDW: 14.6 % (ref 11.5–15.5)
WBC: 9.7 10*3/uL (ref 4.0–10.5)

## 2014-01-13 LAB — BASIC METABOLIC PANEL
Anion gap: 9 (ref 5–15)
BUN: 10 mg/dL (ref 6–23)
CO2: 25 mmol/L (ref 19–32)
Calcium: 9.2 mg/dL (ref 8.4–10.5)
Chloride: 101 mEq/L (ref 96–112)
Creatinine, Ser: 1.07 mg/dL (ref 0.50–1.35)
GFR calc Af Amer: 83 mL/min — ABNORMAL LOW (ref >90)
GFR calc non Af Amer: 71 mL/min — ABNORMAL LOW (ref >90)
Glucose, Bld: 131 mg/dL — ABNORMAL HIGH (ref 70–99)
Potassium: 3.2 mmol/L — ABNORMAL LOW (ref 3.5–5.1)
Sodium: 135 mmol/L (ref 135–145)

## 2014-01-13 LAB — BRAIN NATRIURETIC PEPTIDE: B Natriuretic Peptide: 20 pg/mL (ref 0.0–100.0)

## 2014-01-13 LAB — CK TOTAL AND CKMB (NOT AT ARMC)
CK, MB: 2 ng/mL (ref 0.3–4.0)
Relative Index: INVALID (ref 0.0–2.5)
Total CK: 91 U/L (ref 7–232)

## 2014-01-13 LAB — I-STAT TROPONIN, ED: Troponin i, poc: 0 ng/mL (ref 0.00–0.08)

## 2014-01-13 LAB — TROPONIN I: Troponin I: <0.03 ng/mL (ref <0.031)

## 2014-01-13 LAB — D-DIMER, QUANTITATIVE: D-Dimer, Quant: 0.56 ug/mL-FEU — ABNORMAL HIGH (ref 0.00–0.48)

## 2014-01-13 MED ORDER — NITROGLYCERIN 2 % TD OINT
1.0000 [in_us] | TOPICAL_OINTMENT | Freq: Once | TRANSDERMAL | Status: AC
  Administered 2014-01-13: 1 [in_us] via TOPICAL
  Filled 2014-01-13: qty 1

## 2014-01-13 MED ORDER — PREDNISONE 10 MG PO TABS: 50.0000 mg | ORAL_TABLET | Freq: Every day | ORAL | Status: DC

## 2014-01-13 MED ORDER — METHYLPREDNISOLONE SODIUM SUCC 125 MG IJ SOLR
125.0000 mg | Freq: Once | INTRAMUSCULAR | Status: AC
  Administered 2014-01-13: 125 mg via INTRAVENOUS
  Filled 2014-01-13: qty 2

## 2014-01-13 MED ORDER — ALBUTEROL SULFATE (2.5 MG/3ML) 0.083% IN NEBU
5.0000 mg | INHALATION_SOLUTION | Freq: Once | RESPIRATORY_TRACT | Status: AC
  Administered 2014-01-13: 5 mg via RESPIRATORY_TRACT
  Filled 2014-01-13: qty 6

## 2014-01-13 MED ORDER — ASPIRIN 81 MG PO CHEW
324.0000 mg | CHEWABLE_TABLET | Freq: Once | ORAL | Status: AC
  Administered 2014-01-13: 324 mg via ORAL
  Filled 2014-01-13: qty 4

## 2014-01-13 MED ORDER — DOXYCYCLINE HYCLATE 100 MG PO CAPS: 100.0000 mg | ORAL_CAPSULE | Freq: Two times a day (BID) | ORAL | Status: DC

## 2014-01-13 NOTE — ED Notes (Signed)
After one stick, patient stated no more.

## 2014-01-13 NOTE — ED Provider Notes (Signed)
CSN: [id number]     Arrival date & time 01/13/14  3532 History  This chart was scribed for [name], MD by Peyton Bottoms, ED Scribe. This patient was seen in room A13C/A13C and the patient's care was started at 3:53 AM.   Chief Complaint  Patient presents with  . Chest Pain  . Shortness of Breath   Patient is a 63 y.o. male presenting with chest pain and shortness of breath. The history is provided by the patient. No language interpreter was used.  Chest Pain Pain location:  L chest, R chest and substernal area Pain quality: crushing and pressure   Pain radiates to:  Does not radiate Pain radiates to the back: no   Pain severity:  Moderate Onset quality:  Gradual Duration:  2 days Timing:  Constant Progression:  Waxing and waning Associated symptoms: shortness of breath   Associated symptoms: no abdominal pain, no cough, no dizziness, no headache, no nausea, no numbness, no palpitations, not vomiting and no weakness   Shortness of Breath Associated symptoms: chest pain   Associated symptoms: no abdominal pain, no cough, no headaches, no rash and no vomiting    HPI Comments: Jonathon [name] is a 63 y.o. male with a history of hypertension, CVA, CHF, hydrocele, chronic cough, SOB, COPD and weakness of right side of body, who presents to the Emergency Department complaining of moderate generalized chest pain and shortness of breath that began yesterday morning. He reports associated chest tightness and heaviness. He also reports associated difficulty breathing. He states he was not able to sleep last night due to pain. He denies associated nausea or vomiting. Patient is a smoker. No lower extremity swelling or pain.  Past Medical History  Diagnosis Date  . Hypertension   . Smoker   . History of CVA (cerebrovascular accident) 02-22-2011    LEFT BRAIN STEM PONTINE HEMORRHAGE--  RIGHT SIDED WEAKNESS  . History of CHF (congestive heart failure) MONITORED BY DR BOUSKA     DIASTOLIC  . Hydrocele, left   . Chronic cough   . Short of breath on exertion   . Harsh voice quality     PT STATES NORMAL FOR HIM  . COPD, severe     PT DENIES REGULAR DAILY INHALER ANY PRN  . Pre-operative clearance     GIVEN BY DR Coletta Memos  . Weakness of right side of body     S/P CVA   FEB 2013   Past Surgical History  Procedure Laterality Date  . Thoracotomy/lobectomy Right 03-06-2008    AND STAPLING OF BLEBS FOR SPONTANOUS PNEUMOTHORAX  . Cardiac catheterization  05-02-2011  DR NISHAN    MILD NONOBSTRUCTIVE CAD/ LAD, OM , AND D1/ EF 60^  . Cardiac catheterization  AUG 2000    NON-OBSTRUTIVE CAD/ EF 68%/ 25% PROXIMAL LAD/ 20% MID CIRCUMFLEX  . Transthoracic echocardiogram  02-17-2011    MODERATE LVH/ LVSF NORMAL/ EF 99-24%/ GRADE I DIASTOLIC DYSFUNCTION  . Inguinal hernia repair Left   . Hydrocele excision Left 04/24/2012    Procedure: HYDROCELECTOMY ADULT;  Surgeon: Fredricka Bonine, MD;  Location: [facility];  Service: Urology;  Laterality: Left;  90 mins req for this case     . Left heart catheterization with coronary angiogram [name] 05/02/2011    Procedure: LEFT HEART CATHETERIZATION WITH CORONARY ANGIOGRAM;  Surgeon: Josue Hector, MD;  Location: [facility] CATH LAB;  Service: Cardiovascular;  Laterality: [name];  . Left and right heart catheterization with coronary angiogram  [name] 09/04/2012    Procedure: LEFT AND RIGHT HEART CATHETERIZATION WITH CORONARY ANGIOGRAM;  Surgeon: Laverda Page, MD;  Location: [facility] CATH LAB;  Service: Cardiovascular;  Laterality: [name];  . Abdominal angiogram  09/04/2012    Procedure: ABDOMINAL ANGIOGRAM;  Surgeon: Laverda Page, MD;  Location: [facility] CATH LAB;  Service: Cardiovascular;;   Family History  Problem Relation Age of Onset  . Hypertension Mother   . Lung disease Father     died of "colapsed lung" per patient  . Asthma Father    History  Substance Use Topics  . Smoking status: Current Every Day Smoker -- 1.00 packs/day  for 48 years    Types: Cigarettes    Last Attempt to Quit: 09/17/2013  . Smokeless tobacco: Never Used     Comment: "puff one after dinner sometimes"-11/05/13  . Alcohol Use: Yes     Comment: OCCASIONAL--     Review of Systems  Constitutional: Negative for chills.  Respiratory: Positive for shortness of breath. Negative for cough.   Cardiovascular: Positive for chest pain. Negative for palpitations and leg swelling.  Gastrointestinal: Negative for nausea, vomiting and abdominal pain.  Skin: Negative for rash and wound.  Neurological: Negative for dizziness, weakness, light-headedness, numbness and headaches.  All other systems reviewed and are negative.  Allergies  Review of patient's allergies indicates no known allergies.  Home Medications   Prior to Admission medications   Medication Sig Start Date End Date Taking? Authorizing Provider  albuterol (PROVENTIL HFA;VENTOLIN HFA) 108 (90 BASE) MCG/ACT inhaler Inhale 2 puffs into the lungs every 6 (six) hours as needed for wheezing or shortness of breath (for copd).     Historical Provider, MD  albuterol (PROVENTIL) (2.5 MG/3ML) 0.083% nebulizer solution Take 3 mLs (2.5 mg total) by nebulization every 6 (six) hours as needed for wheezing or shortness of breath. 09/29/13   Geradine Girt, DO  amLODipine (NORVASC) 10 MG tablet Take 10 mg by mouth daily.    Historical Provider, MD  aspirin EC 325 MG tablet Take 325 mg by mouth daily.    Historical Provider, MD  atorvastatin (LIPITOR) 10 MG tablet Take 5 mg by mouth daily.    Historical Provider, MD  cloNIDine (CATAPRES) 0.3 MG tablet Take 0.3 mg by mouth 2 (two) times daily.    Historical Provider, MD  cyclobenzaprine (FLEXERIL) 10 MG tablet Take 1 tablet (10 mg total) by mouth 2 (two) times daily as needed for muscle spasms. 10/04/13   Kelby Aline, MD  gabapentin (NEURONTIN) 400 MG capsule Take 800 mg by mouth 3 (three) times daily.    Historical Provider, MD  hydrochlorothiazide  (HYDRODIURIL) 25 MG tablet Take 25 mg by mouth daily.    Historical Provider, MD  HYDROmorphone (DILAUDID) 2 MG tablet Take 0.5 tablets (1 mg total) by mouth every 4 (four) hours as needed for severe pain. 09/30/13   Carmin Muskrat, MD  irbesartan (AVAPRO) 150 MG tablet Take 1 tablet (150 mg total) by mouth daily. 10/08/13   Tanda Rockers, MD  metFORMIN (GLUCOPHAGE) 500 MG tablet Take 500 mg by mouth daily with breakfast.    Historical Provider, MD  methocarbamol (ROBAXIN) 500 MG tablet Take 1 tablet (500 mg total) by mouth 2 (two) times daily as needed for muscle spasms. 10/09/13   Verl Dicker, PA-C  naproxen (NAPROSYN) 500 MG tablet Take 1 tablet (500 mg total) by mouth 2 (two) times daily with a meal. 10/09/13   Verl Dicker, PA-C  nortriptyline (PAMELOR) 25 MG capsule Take 25 mg by mouth at bedtime.    Historical Provider, MD  psyllium (REGULOID) 0.52 G capsule Take 0.52 g by mouth daily.    Historical Provider, MD  vitamin B-12 (CYANOCOBALAMIN) 500 MCG tablet Take 500 mcg by mouth daily.    Historical Provider, MD  zolpidem (AMBIEN) 5 MG tablet Take 5 mg by mouth at bedtime as needed for sleep.  09/04/13   Historical Provider, MD   Triage Vitals: BP 118/85 mmHg  Pulse 93  Temp(Src) 98.2 F (36.8 C) (Oral)  Resp 17  Ht 6' (1.829 m)  Wt 210 lb (95.255 kg)  BMI 28.47 kg/m2  SpO2 94%  Physical Exam  Constitutional: He is oriented to person, place, and time. He appears well-developed and well-nourished. No distress.  HENT:  Head: Normocephalic and atraumatic.  Mouth/Throat: Oropharynx is clear and moist.  Eyes: Conjunctivae and EOM are normal. Pupils are equal, round, and reactive to light.  Neck: Normal range of motion. Neck supple. No tracheal deviation present.  Cardiovascular: Normal rate and regular rhythm.   Pulmonary/Chest: Effort normal. No respiratory distress. He has wheezes (Mild expiratory wheezing in left lung field). He has no rales. He exhibits no tenderness.   Abdominal: Soft. Bowel sounds are normal. He exhibits no distension and no mass. There is no tenderness. There is no rebound and no guarding.  Musculoskeletal: Normal range of motion. He exhibits no edema or tenderness.  No calf swelling or tenderness.  Neurological: He is alert and oriented to person, place, and time.  Moves all extremities without deficit. Sensation is intact.  Skin: Skin is warm and dry. No rash noted. No erythema.  Psychiatric: He has a normal mood and affect. His behavior is normal.  Nursing note and vitals reviewed.  ED Course  Procedures (including critical care time)  DIAGNOSTIC STUDIES: Oxygen Saturation is 94% on RA, low by my interpretation.    COORDINATION OF CARE: 4:05 AM- Discussed plans to order diagnostic CXR, EKG and lab work. Pt advised of plan for treatment and pt agrees.  Labs Review Labs Reviewed  CBC  BASIC METABOLIC PANEL  BRAIN NATRIURETIC PEPTIDE  I-STAT [place], ED   Imaging Review No results found.   EKG Interpretation None       Date: 01/13/2014  Rate:88  Rhythm: normal sinus rhythm  QRS Axis: normal  Intervals: normal  ST/T Wave abnormalities: nonspecific ST/T changes  Conduction Disutrbances:none  Narrative Interpretation:   Old EKG Reviewed: unchanged   MDM   Final diagnoses:  Chest pain    Chest tightness improved after breathing treatment though still present. Give aspirin and nitroglycerin. Discussed with Dr. [name]. We'll evaluate the patient in the emergency department for possible admission.  I personally performed the services described in this documentation, which was scribed in my presence. The recorded information has been reviewed and is accurate.  [name], MD 01/13/14 [phone]

## 2014-01-13 NOTE — ED Provider Notes (Signed)
Care assumed from Dr. Lita Mains with the plan for Dr. [name] to evaluate from a cardiac standpoint.  Dr. [name] did not feel the symptoms were secondary to cardiac disease. He recommended no further cardiac evaluation after repeat troponin was negative and d-dimer was age adjusted negative. Plan DC with treatment for COPD exacerbation.  Looked well and felt well at discharge.  Clinical Impression: 1. COPD exacerbation   2. Chest pain       Jonathon Siren III, MD 01/13/14 [phone]

## 2014-01-13 NOTE — Consult Note (Signed)
CARDIOLOGY CONSULT NOTE  Patient ID: Jonathon [name] MRN: [id number] DOB/AGE: [birth year] 64 y.o.  Admit date: 01/13/2014 Referring Physician  [name], MD Primary Physician:  Phineas Inches, MD Reason for Consultation  Chest pain  HPI: Patient is a 63 year old Arabic speaking gentleman with history of hypertension, hyperlipidemia, history of prior stroke without any residual defect, COPD with ongoing tobacco use disorder. He has undergone coronary angiography on 09/04/2012 which had revealed mild disease and chronic calcification. He had been doing well until yesterday started having chest tightness that pretty much lasted the whole day. Last evening he went to bed early thinking that he may feel better, but he kept tossing and turning through the night as his chest pain in the form of chest tightness and heaviness persisted. He has no associated shortness of breath, nausea, vomiting or diarrhea, no headache or visual disturbances, felt that he needed to come to the emergency room to be further evaluated.  He was ruled out by serial troponins, I was consulted to opine, patient and his wife present at the bedside. He states that his chest pain is completely resolved and he would like to go home. He states that his brother is visiting him from [place], and would like to be with him.  He denies any symptoms of TIA, headache, visual disturbances, claudication. He states that his chest pain did get that her with albuterol therapy. He also stated that he took 3 sublingual nitroglycerin in the emergency room which also helped.  Past Medical History  Diagnosis Date  . Hypertension   . Smoker   . History of CVA (cerebrovascular accident) 02-22-2011    LEFT BRAIN STEM PONTINE HEMORRHAGE--  RIGHT SIDED WEAKNESS  . History of CHF (congestive heart failure) MONITORED BY DR BOUSKA    DIASTOLIC  . Hydrocele, left   . Chronic cough   . Short of breath on exertion   . Harsh voice quality     PT  STATES NORMAL FOR HIM  . COPD, severe     PT DENIES REGULAR DAILY INHALER ANY PRN  . Pre-operative clearance     GIVEN BY DR Coletta Memos  . Weakness of right side of body     S/P CVA   FEB 2013     Past Surgical History  Procedure Laterality Date  . Thoracotomy/lobectomy Right 03-06-2008    AND STAPLING OF BLEBS FOR SPONTANOUS PNEUMOTHORAX  . Cardiac catheterization  05-02-2011  DR NISHAN    MILD NONOBSTRUCTIVE CAD/ LAD, OM , AND D1/ EF 60^  . Cardiac catheterization  AUG 2000    NON-OBSTRUTIVE CAD/ EF 68%/ 25% PROXIMAL LAD/ 20% MID CIRCUMFLEX  . Transthoracic echocardiogram  02-17-2011    MODERATE LVH/ LVSF NORMAL/ EF 44-03%/ GRADE I DIASTOLIC DYSFUNCTION  . Inguinal hernia repair Left   . Hydrocele excision Left 04/24/2012    Procedure: HYDROCELECTOMY ADULT;  Surgeon: Fredricka Bonine, MD;  Location: [facility];  Service: Urology;  Laterality: Left;  90 mins req for this case     . Left heart catheterization with coronary angiogram [name] 05/02/2011    Procedure: LEFT HEART CATHETERIZATION WITH CORONARY ANGIOGRAM;  Surgeon: Josue Hector, MD;  Location: [facility] CATH LAB;  Service: Cardiovascular;  Laterality: [name];  . Left and right heart catheterization with coronary angiogram [name] 09/04/2012    Procedure: LEFT AND RIGHT HEART CATHETERIZATION WITH CORONARY ANGIOGRAM;  Surgeon: Laverda Page, MD;  Location: [facility] CATH LAB;  Service: Cardiovascular;  Laterality: [name];  .  Abdominal angiogram  09/04/2012    Procedure: ABDOMINAL ANGIOGRAM;  Surgeon: Laverda Page, MD;  Location: [facility] CATH LAB;  Service: Cardiovascular;;     Family History  Problem Relation Age of Onset  . Hypertension Mother   . Lung disease Father     died of "colapsed lung" per patient  . Asthma Father      Social History: History   Social History  . Marital Status: Married    Spouse Name: [name]    Number of Children: [name]  . Years of Education: [name]   Occupational History  . Not on file.    Social History Main Topics  . Smoking status: Current Every Day Smoker -- 1.00 packs/day for 48 years    Types: Cigarettes    Last Attempt to Quit: 09/17/2013  . Smokeless tobacco: Never Used     Comment: "puff one after dinner sometimes"-11/05/13  . Alcohol Use: Yes     Comment: OCCASIONAL--    . Drug Use: No  . Sexual Activity: Not on file   Other Topics Concern  . Not on file   Social History Narrative      (Not in a hospital admission)  Scheduled Meds: Continuous Infusions: PRN Meds:.    ROS: General: no fevers/chills/night sweats Eyes: no blurry vision, diplopia, or amaurosis ENT: no sore throat or hearing loss Resp:  Chronic cough and mild chronic dyspnea present, no PND or orthopnea. CV: no edema or palpitations GI: no abdominal pain, nausea, vomiting, diarrhea, or constipation GU: no dysuria, frequency, or hematuria Skin: no rash Neuro: no headache, numbness, tingling, or weakness of extremities Musculoskeletal: no joint pain or swelling Heme: no bleeding, DVT, or easy bruising Endo: no polydipsia or polyuria    Physical Exam: Blood pressure 144/98, pulse 84, temperature 98.2 F (36.8 C), temperature source Oral, resp. rate 22, height 6' (1.829 m), weight 95.255 kg (210 lb), SpO2 97 %.   General appearance: alert, cooperative, appears stated age, no distress and mildly obese Lungs: wheezes bilaterally Chest wall: no tenderness Heart: regular rate and rhythm, S1, S2 normal, no murmur, click, rub or gallop and Distant heart sounds Abdomen: soft, non-tender; bowel sounds normal; no masses,  no organomegaly Extremities: extremities normal, atraumatic, no cyanosis or edema Pulses: 2+ and symmetric Neurologic: Grossly normal  Labs:   Lab Results  Component Value Date   WBC 9.7 01/13/2014   HGB 16.2 01/13/2014   HCT 47.5 01/13/2014   MCV 86.5 01/13/2014   PLT 229 01/13/2014    Recent Labs Lab 01/13/14 0357  NA 135  K 3.2*  CL 101  CO2 25  BUN  10  CREATININE 1.07  CALCIUM 9.2  GLUCOSE 131*   Lab Results  Component Value Date   CKTOTAL 91 01/13/2014   CKMB 2.0 01/13/2014   TROPONINI <0.03 01/13/2014    Lipid Panel     Component Value Date/Time   CHOL 103 04/30/2011 0847   TRIG 76 04/30/2011 0847   HDL 32* 04/30/2011 0847   CHOLHDL 3.2 04/30/2011 0847   VLDL 15 04/30/2011 0847   LDLCALC 56 04/30/2011 0847    EKG: Normal sinus rhythm, normal axis, minimal upsloping 0.5 mm ST depression in the lateral lead which is unchanged from prior EKG..    Radiology: Dg Chest 2 View  01/13/2014   CLINICAL DATA:  Chest pain  EXAM: CHEST  2 VIEW  COMPARISON:  10/09/2013.  FINDINGS: There is advanced COPD with pulmonary hyperinflation and bullous emphysematous change. Patient  is status post right-sided thoracotomy with upper lobectomy. There is a lingular nodule which was recently characterized by chest CT, non measurable on this examination. A nipple shadow is presently noted at the peripheral left base. There is no edema, consolidation, effusion, or pneumothorax. Heart size remains normal. Moderate aortic tortuosity is unchanged.  IMPRESSION: Advanced COPD.  No acute superimposed findings.   Electronically Signed   By: Jorje Guild [name]   On: 01/13/2014 04:59    ASSESSMENT AND PLAN:  1. Atypical chest pain, in spite of chest pain ongoing for several hours, the chest pain was not associated with elevated cardiac markers. Minimal elevation in d-dimer is nonspecific. No clinical evidence to suggest pulmonary embolism, known leg edema, chest pain is central, no EKG changes, no hypoxemia. No recent travel. 2. Mild noncritical coronary artery disease by coronary angiography performed on 09/04/2012. 3. Advanced COPD with ongoing tobacco use disorder, suspect his symptoms are related to COPD exacerbation. 4. Hypertension  Recommendation: Patient clearly at a high risk for having coronary events although the symptoms on presentation does not  appear to be one of ACS. I do not think he also has high risk features suggestive of PE. From cardiac standpoint can be discharged home, although due to his multiple cardiac vascular risk factors appropriate actually been admission with inpatient stress test, patient once to go home to be with his brother, he is aware of risks associated with this. Tobacco use cessation again discussed with the patient and his wife at the bedside.  Laverda Page, MD 01/13/2014, 10:11 AM [facility]. [place] Pager: [phone] Office: [phone] If no answer Cell [phone]

## 2014-01-13 NOTE — ED Notes (Signed)
Patient with chest pain and shortness of breath that started yesterday morning.  Patient states that it feels like a heaviness on his chest.  Patient denies any nausea or vomiting.

## 2014-01-13 NOTE — ED Notes (Signed)
Patient states he does not want to be admitted to hospital, Lita Mains, MD notified, cardiologist to see patient in ED,

## 2014-01-13 NOTE — ED Notes (Signed)
Dr. Lita Mains at the bedside. EKG available at the bedside, Dr. Lita Mains reviewed.

## 2014-01-13 NOTE — ED Notes (Signed)
pts vital signs updated pt awaiting discharge paperwork at bedside.

## 2014-01-16 ENCOUNTER — Ambulatory Visit: Payer: Medicare Other | Admitting: Physical Therapy

## 2014-01-21 ENCOUNTER — Ambulatory Visit: Payer: Medicare Other | Admitting: Physical Therapy

## 2014-01-23 ENCOUNTER — Inpatient Hospital Stay: Payer: Medicare Other | Admitting: Internal Medicine

## 2014-01-27 ENCOUNTER — Ambulatory Visit (INDEPENDENT_AMBULATORY_CARE_PROVIDER_SITE_OTHER): Payer: Medicare Other | Admitting: Internal Medicine

## 2014-01-27 ENCOUNTER — Encounter: Payer: Self-pay | Admitting: Internal Medicine

## 2014-01-27 VITALS — BP 110/80 | HR 76

## 2014-01-27 DIAGNOSIS — R9389 Abnormal findings on diagnostic imaging of other specified body structures: Secondary | ICD-10-CM

## 2014-01-27 DIAGNOSIS — J449 Chronic obstructive pulmonary disease, unspecified: Secondary | ICD-10-CM

## 2014-01-27 DIAGNOSIS — R938 Abnormal findings on diagnostic imaging of other specified body structures: Secondary | ICD-10-CM

## 2014-01-27 NOTE — Patient Instructions (Addendum)
Stay on same medications for now  [facility] to use albuterol if having a bad day to see what difference it makes with  Exertion (whether you take it before or half way through does it affect your shortness of breath?)  GERD (REFLUX)  is an extremely common cause of respiratory symptoms just like yours , many times with no obvious heartburn at all.    It can be treated with medication, but also with lifestyle changes including avoidance of late meals, excessive alcohol, smoking cessation, and avoid fatty foods, chocolate, peppermint, colas, red wine, and acidic juices such as orange juice.  NO MINT OR MENTHOL PRODUCTS SO NO COUGH DROPS  USE SUGARLESS CANDY INSTEAD (Jolley ranchers or Stover's or Life Savers) or even ice chips will also do - the key is to swallow to prevent all throat clearing. NO OIL BASED VITAMINS - use powdered substitutes.    See Tammy NP in 4  weeks with all your medications, even over the counter meds, separated in two separate bags, the ones you take no matter what vs the ones you stop once you feel better and take only as needed when you feel you need them.   Tammy  will generate for you a new user friendly medication calendar that will put [place] all on the same page re: your medication use.

## 2014-01-27 NOTE — Progress Notes (Signed)
Subjective:    Patient ID: Jonathon [name], male    DOB: 01/04/1951  MRN: [id number]    Brief patient profile:  64 yo palestinian  male smoker  dx 2010 with R PTX secondary to emphysematous blebs in [place] where underwent  RULobectomy for bullous dz   then felt better but then similar pain around first of sept 2015 with pain ant and post on R > went to ER with ? Lingular mass so referred to pulmonary clinic 10/08/2013 by Dr Coletta Memos with documented GOLD II copd with reversibility by pfts 11/05/2013     History of Present Illness  10/08/2013 1st [facility] office visit/ Jonathon [name]   Chief Complaint  Patient presents with  . Pulmonary Consult    Self referral. Pt c/o right side pain and SOB x 1 month. Went to ED on 09/27/13 and CT Chest was performed. He states that last night the pain started moving across his chest. He gets SOB when he walks " for a while" and with walking up stairs. He is using albuterol inhaler 3-4 times per wk and neb maybe once per month.   cp better p rx with opioids, very similar to prev cp with ptx but now pain migrating over to L side, better lying flat, not really pleuritic, some cough and hoarseness chronically on acei but "no worse than usual"  Worse pain is with R arm abduction and Int rotation rec Stop lisinopril Start Avapro(Ibesartan) 150 mg one daily - if blood pressure too high (over 140/90)  can take it twice daily  The pain under you ribs that is working over to the left side is probably gas pain. Treatment consists of avoiding foods that cause gas (especially beans and boiled eggs  and raw vegetables like spinach and salads)  and citrucel powder in the tall can take 1 heaping tsp twice daily with a large glass of water.  Pain should improve w/in 2 weeks.   11/05/2013 f/u ov/Jonathon [name] re: GOLD II copd with reversibility/ still occ cigarette Chief Complaint  Patient presents with  . Follow-up    Pt states that his breathing has been worse for the past 3  wks. He also c/o "right lung pain"- bothers him when he sleeps on his rt side.  He is using rescue inhaler about 1-2 times per day and has used neb several times since the last visit.   says voice is the same for years Does ok at [employer] but not [employer]   Uses hc sp cva  Very confused with various inhalers Still on labetolol  Uses wife's inhalers but very poor at [facility]fa/ still smoking rec Stop the naprosyn and labetolol Take your omeprazole 40 mg Take 30- 60 min before your first and last meals of the day  Only use your albuterol(proair or nebulizer) as a rescue medication  F/u with med calendar > never done    01/27/2014 f/u ov/Jonathon [name] re: copd GOLDII  still smoking/ with variable doe, no longer on gerd rx,  seeing multiple providers, confused with meds  Chief Complaint  Patient presents with  . Follow-up    Pt states started having chest tightness and increased SOB 01/12/14- went to ED the following day, but was not admitted. He states that his chest tightness is now gone and SOB back to baseline. He is using rescue inhaler and neb on average 1 x per wk.   not really sure the saba helps as doesn't use it when has  A bad day with his breathing.   No obvious day to day or daytime variabilty or assoc chronic cough or cp or chest tightness, subjective wheeze overt sinus or hb symptoms. No unusual exp hx or h/o childhood pna/ asthma or knowledge of premature birth.  Sleeping ok without nocturnal  or early am exacerbation  of respiratory  c/o's or need for noct saba. Also denies any obvious fluctuation of symptoms with weather or environmental changes or other aggravating or alleviating factors except as outlined above   Current Medications, Allergies, Complete Past Medical History, Past Surgical History, Family History, and Social History were reviewed in [employer] record.  ROS  The following are not active complaints unless bolded sore throat, dysphagia, dental  problems, itching, sneezing,  nasal congestion or excess/ purulent secretions, ear ache,   fever, chills, sweats, unintended wt loss, pleuritic or exertional cp, hemoptysis,  orthopnea pnd or leg swelling, presyncope, palpitations, heartburn, abdominal pain, anorexia, nausea, vomiting, diarrhea  or change in bowel or urinary habits, change in stools or urine, dysuria,hematuria,  rash, arthralgias R shoulder, visual complaints, headache, numbness weakness or ataxia or problems with walking or coordination,  change in mood/affect or memory.                        Objective:   Physical Exam  11/05/2013      216  > 01/27/2014 214  Wt Readings from Last 3 Encounters:  10/08/13 220 lb (99.791 kg)  09/29/13 217 lb 11.2 oz (98.748 kg)  09/04/12 204 lb (92.534 kg)      amb extremely  hoarse arabic male nad  HEENT mild turbinate edema.  Oropharynx no thrush or excess pnd or cobblestoning.  No JVD or cervical adenopathy. Mild accessory muscle hypertrophy. Trachea midline, nl thryroid. Chest was hyperinflated by percussion with diminished breath sounds and moderate increased exp time without wheeze. Hoover sign positive at mid inspiration. Regular rate and rhythm without murmur gallop or rub or increase P2 or edema.  Abd: no hsm, nl excursion. Ext warm without cyanosis or clubbing.       Ct chest no contrast 12/25/13 1. Minimally less prominent region of nodularity along the scarring in the lingula. There is been some chronic scarring in this area for at least 5 years, although it has been more nodular over the past few months. It could be that this represents scarring and adjacent rounded atelectasis particularly given the thick radiation extending to the hilum which is characteristic of rounded atelectasis. If PET-CT is not elected, I would suggest a follow up CT chest in 6-9 months in order to ensure continued stability. 2. Prior right upper lobectomy. 3. Markedly severe emphysema. I doubt  that the patient has much respiratory reserve           Assessment & Plan:

## 2014-01-27 NOTE — Assessment & Plan Note (Signed)
S/p RULobectomy 03/07/08 for bullous dz with PTX See CT 09/27/13 1. No CT findings for pulmonary embolism.  2. Tortuosity, ectasia and calcification of the thoracic aorta but  no dissection.  3. Severe bullous emphysema.  4. Soft tissue mass in the lingula adjacent to the heart could be  compressive atelectasis  -  Repeat CT 12/25/13 no mass   No further studies needed at this point

## 2014-01-27 NOTE — Assessment & Plan Note (Signed)
-    PFT's 11/05/2013  FEV1 1.84 (54%) ratio 52 p 23% improvement from saba and dlco 62 corrects to 76% on labetolol (d/c'd) - 11/05/2013  Walked RA x 3 laps @ 185 ft each stopped due to  Slow pace/ no desat or sign sob  - 01/27/2014  Walked RA x 3 laps @ 185 ft each stopped due to  End of study, no sob, nl pace    I had an extended discussion with the patient reviewing all relevant studies completed to date and  lasting 15 to 20 minutes of a 25 minute visit on the following ongoing concerns:  Again is thoroughly confused with meds/ names/ how to take them   To keep things simple, I have asked the patient to first separate medicines that are perceived as maintenance, that is to be taken daily "no matter what", from those medicines that are taken on only on an as-needed basis and I have given the patient examples of both, and then return to see our NP to generate a  detailed  medication calendar which should be followed until the next physician sees the patient and updates it.    For now just add saba before ex to see if helps and stop using it automatically plus try gerd diet then add back ppi ac bid once he's on a med calendar and if hoarseness continues with unexplained doe > needs ent eval

## 2014-02-03 ENCOUNTER — Ambulatory Visit: Payer: Medicare Other | Attending: Family Medicine | Admitting: Physical Therapy

## 2014-02-03 DIAGNOSIS — F419 Anxiety disorder, unspecified: Secondary | ICD-10-CM | POA: Insufficient documentation

## 2014-02-03 DIAGNOSIS — I10 Essential (primary) hypertension: Secondary | ICD-10-CM | POA: Insufficient documentation

## 2014-02-03 DIAGNOSIS — R262 Difficulty in walking, not elsewhere classified: Secondary | ICD-10-CM | POA: Diagnosis not present

## 2014-02-03 DIAGNOSIS — R260 Ataxic gait: Secondary | ICD-10-CM | POA: Insufficient documentation

## 2014-02-03 DIAGNOSIS — J449 Chronic obstructive pulmonary disease, unspecified: Secondary | ICD-10-CM | POA: Insufficient documentation

## 2014-02-03 DIAGNOSIS — Z5189 Encounter for other specified aftercare: Secondary | ICD-10-CM | POA: Diagnosis present

## 2014-02-03 DIAGNOSIS — Z8673 Personal history of transient ischemic attack (TIA), and cerebral infarction without residual deficits: Secondary | ICD-10-CM | POA: Insufficient documentation

## 2014-02-04 ENCOUNTER — Ambulatory Visit: Payer: Medicare Other | Admitting: Physical Therapy

## 2014-02-04 DIAGNOSIS — Z5189 Encounter for other specified aftercare: Secondary | ICD-10-CM | POA: Diagnosis not present

## 2014-02-11 ENCOUNTER — Ambulatory Visit (AMBULATORY_SURGERY_CENTER): Payer: Self-pay

## 2014-02-11 VITALS — Ht 72.0 in | Wt 211.0 lb

## 2014-02-11 DIAGNOSIS — Z1211 Encounter for screening for malignant neoplasm of colon: Secondary | ICD-10-CM

## 2014-02-11 MED ORDER — SUPREP BOWEL PREP KIT 17.5-3.13-1.6 GM/177ML PO SOLN: 1.0000 | Freq: Once | ORAL | Status: DC

## 2014-02-11 NOTE — Progress Notes (Signed)
No allergies to eggs or soy No past problems with anesthesia No home oxygen No diet/weight loss meds  Has email  Emmi instructions given for colonoscopy 

## 2014-02-12 ENCOUNTER — Telehealth: Payer: Self-pay | Admitting: *Deleted

## 2014-02-12 NOTE — Telephone Encounter (Signed)
Please review patient's medical history. Patient already had pre-visit and nurse was concerned. Patient is for direct colonoscopy at [facility] on 02-20-14 with Dr.Kaplan. Is patient okay for [facility]? Thanks

## 2014-02-12 NOTE — Telephone Encounter (Signed)
Jonathon [name],  This pt is cleared for care at [facility],  [name]

## 2014-02-12 NOTE — Telephone Encounter (Signed)
Noted. Thanks! [name],PV

## 2014-02-20 ENCOUNTER — Ambulatory Visit (AMBULATORY_SURGERY_CENTER): Payer: Medicare Other | Admitting: Gastroenterology

## 2014-02-20 ENCOUNTER — Encounter: Payer: Self-pay | Admitting: Gastroenterology

## 2014-02-20 VITALS — BP 156/78 | HR 61 | Temp 97.3°F | Resp 21 | Ht 72.0 in | Wt 211.0 lb

## 2014-02-20 DIAGNOSIS — Z1211 Encounter for screening for malignant neoplasm of colon: Secondary | ICD-10-CM

## 2014-02-20 DIAGNOSIS — D125 Benign neoplasm of sigmoid colon: Secondary | ICD-10-CM

## 2014-02-20 DIAGNOSIS — D124 Benign neoplasm of descending colon: Secondary | ICD-10-CM | POA: Diagnosis not present

## 2014-02-20 DIAGNOSIS — K5730 Diverticulosis of large intestine without perforation or abscess without bleeding: Secondary | ICD-10-CM

## 2014-02-20 LAB — GLUCOSE, CAPILLARY
Glucose-Capillary: 108 mg/dL — ABNORMAL HIGH (ref 70–99)
Glucose-Capillary: 104 mg/dL — ABNORMAL HIGH (ref 70–99)

## 2014-02-20 MED ORDER — SODIUM CHLORIDE 0.9 % IV SOLN: 500.0000 mL | INTRAVENOUS | Status: DC

## 2014-02-20 NOTE — Patient Instructions (Signed)
Discharge instructions given. Handouts on polyps,diverticulosis and a high fiber diet. Resume previous medications. Your may notice some irritation in your nose or some drainage.  This may cause feelings of congestion.  This is from the oxygen, which can be very drying.  There is no need for concern, this should clear up in a day or so YOU HAD AN ENDOSCOPIC PROCEDURE TODAY AT [facility]: Refer to the procedure report that was given to you for any specific questions about what was found during the examination.  If the procedure report does not answer your questions, please call your gastroenterologist to clarify.  If you requested that your care partner not be given the details of your procedure findings, then the procedure report has been included in a sealed envelope for you to review at your convenience later.  YOU SHOULD EXPECT: Some feelings of bloating in the abdomen. Passage of more gas than usual.  Walking can help get rid of the air that was put into your GI tract during the procedure and reduce the bloating. If you had a lower endoscopy (such as a colonoscopy or flexible sigmoidoscopy) you may notice spotting of blood in your stool or on the toilet paper. If you underwent a bowel prep for your procedure, then you may not have a normal bowel movement for a few days.  DIET: Your first meal following the procedure should be a light meal and then it is ok to progress to your normal diet.  A half-sandwich or bowl of soup is an example of a good first meal.  Heavy or fried foods are harder to digest and may make you feel nauseous or bloated.  Likewise meals heavy in dairy and vegetables can cause extra gas to form and this can also increase the bloating.  Drink plenty of fluids but you should avoid alcoholic beverages for 24 hours.  ACTIVITY: Your care partner should take you home directly after the procedure.  You should plan to take it easy, moving slowly for the rest of the day.   You can resume normal activity the day after the procedure however you should NOT DRIVE or use heavy machinery for 24 hours (because of the sedation medicines used during the test).    SYMPTOMS TO REPORT IMMEDIATELY: A gastroenterologist can be reached at any hour.  During normal business hours, 8:30 AM to 5:00 PM Monday through Friday, call [phone].  After hours and on weekends, please call the GI answering service at [phone] who will take a message and have the physician on call contact you.   Following lower endoscopy (colonoscopy or flexible sigmoidoscopy):  Excessive amounts of blood in the stool  Significant tenderness or worsening of abdominal pains  Swelling of the abdomen that is new, acute  Fever of 100F or higher  FOLLOW UP: If any biopsies were taken you will be contacted by phone or by letter within the next 1-3 weeks.  Call your gastroenterologist if you have not heard about the biopsies in 3 weeks.  Our staff will call the home number listed on your records the next business day following your procedure to check on you and address any questions or concerns that you may have at that time regarding the information given to you following your procedure. This is a courtesy call and so if there is no answer at the home number and we have not heard from you through the emergency physician on call, we will assume that you  have returned to your regular daily activities without incident.  SIGNATURES/CONFIDENTIALITY: You and/or your care partner have signed paperwork which will be entered into your electronic medical record.  These signatures attest to the fact that that the information above on your After Visit Summary has been reviewed and is understood.  Full responsibility of the confidentiality of this discharge information lies with you and/or your care-partner.

## 2014-02-20 NOTE — Progress Notes (Signed)
Stable to RR 

## 2014-02-20 NOTE — Progress Notes (Signed)
Called to room to assist during endoscopic procedure.  Patient ID and intended procedure confirmed with present staff. Received instructions for my participation in the procedure from the performing physician.  

## 2014-02-20 NOTE — Op Note (Signed)
[facility]  [address] [place], [id number]   COLONOSCOPY PROCEDURE REPORT  PATIENT: Jonathon, [name]  MR#: [id number] BIRTHDATE: 07/06/1950 , 64  yrs. old GENDER: male ENDOSCOPIST: Inda Castle, MD REFERRED KS:KSHNG Bouska, [name] PROCEDURE DATE:  02/20/2014 PROCEDURE:   Colonoscopy with snare polypectomy and Colonoscopy with cold biopsy polypectomy First Screening Colonoscopy - Avg.  risk and is 50 yrs.  old or older Yes.  Prior Negative Screening - Now for repeat screening. [name]  History of Adenoma - Now for follow-up colonoscopy & has been > or = to 3 yrs.  [name]  Polyps Removed Today? Yes. ASA CLASS:   Class III INDICATIONS:first colonoscopy and average risk for colon cancer. MEDICATIONS: Monitored anesthesia care, Propofol 500 mg IV, and Lidocaine 40 mg IV  DESCRIPTION OF PROCEDURE:   After the risks benefits and alternatives of the procedure were thoroughly explained, informed consent was obtained.  The digital rectal exam revealed no abnormalities of the rectum.   The LB [id number]  endoscope was introduced through the anus and advanced to the cecum, which was identified by both the appendix and ileocecal valve. No adverse events experienced.   Limited by poor preparation.   The quality of the prep was Suprep fair There was a significant amount of retained liquid stool The instrument was then slowly withdrawn as the colon was fully examined.      COLON FINDINGS: There was mild diverticulosis noted in the descending colon.   A sessile polyp measuring 5 mm in size was found in the descending colon.  A polypectomy was performed with a cold snare.  The resection was complete but the polyp tissue was not retrieved.   Two flat polyps measuring 2 mm in size were found in the sigmoid colon.  A polypectomy was performed with cold forceps.   A sessile polyp measuring 3 mm in size was found in the sigmoid colon.  A polypectomy was performed with  cold forceps. Retroflexed views revealed no abnormalities. The time to cecum=3 minutes 58 seconds.  Withdrawal time=19 minutes 76 seconds.  The scope was withdrawn and the procedure completed. COMPLICATIONS: There were no immediate complications.  ENDOSCOPIC IMPRESSION: 1.   Mild diverticulosis was noted in the descending colon 2.   Sessile polyp was found in the descending colon; polypectomy was performed with a cold snare 3.   Two flat polyps were found in the sigmoid colon; polypectomy was performed with cold forceps 4.   Sessile polyp was found in the sigmoid colon; polypectomy was performed with cold forceps  RECOMMENDATIONS: If adenomatous changes are seen repeat colonoscopy in 3 years , otherwise repeat colonoscopy in 5 years due to limitations of exam secondary to moderately poor prep  eSigned:  Inda Castle, MD 02/20/2014 12:25 PM   cc:   PATIENT NAME:  Jonathon [name], Jonathon [name] MR#: [id number]

## 2014-02-21 ENCOUNTER — Telehealth: Payer: Self-pay | Admitting: *Deleted

## 2014-02-21 NOTE — Telephone Encounter (Signed)
  Follow up Call-  Call back number 02/20/2014  Post procedure Call Back phone  # [phone]  Permission to leave phone message Yes     Patient questions:  Do you have a fever, pain , or abdominal swelling? No. Pain Score  0 *  Have you tolerated food without any problems? Yes.    Have you been able to return to your normal activities? Yes.    Do you have any questions about your discharge instructions: Diet   No. Medications  No. Follow up visit  No.  Do you have questions or concerns about your Care? No.  Actions: * If pain score is 4 or above: No action needed, pain <4.

## 2014-02-24 ENCOUNTER — Other Ambulatory Visit: Payer: Self-pay | Admitting: Adult Health

## 2014-02-24 ENCOUNTER — Encounter (INDEPENDENT_AMBULATORY_CARE_PROVIDER_SITE_OTHER): Payer: Self-pay

## 2014-02-24 ENCOUNTER — Ambulatory Visit (INDEPENDENT_AMBULATORY_CARE_PROVIDER_SITE_OTHER): Payer: Medicare Other | Admitting: Adult Health

## 2014-02-24 ENCOUNTER — Encounter: Payer: Self-pay | Admitting: Adult Health

## 2014-02-24 VITALS — BP 122/78 | HR 60 | Temp 97.8°F | Ht 70.0 in | Wt 213.4 lb

## 2014-02-24 DIAGNOSIS — J449 Chronic obstructive pulmonary disease, unspecified: Secondary | ICD-10-CM | POA: Diagnosis not present

## 2014-02-24 DIAGNOSIS — I10 Essential (primary) hypertension: Secondary | ICD-10-CM | POA: Diagnosis not present

## 2014-02-24 MED ORDER — BISOPROLOL FUMARATE 10 MG PO TABS: 10.0000 mg | ORAL_TABLET | Freq: Two times a day (BID) | ORAL | Status: DC

## 2014-02-24 MED ORDER — ALBUTEROL SULFATE 108 (90 BASE) MCG/ACT IN AEPB: 2.0000 | INHALATION_SPRAY | Freq: Four times a day (QID) | RESPIRATORY_TRACT | Status: AC | PRN

## 2014-02-24 NOTE — Patient Instructions (Addendum)
Stop Labetalol .  Begin Bisoprolol 10mg  Twice daily   Follow med calendar closely and bring to each visit. Follow up Dr. Melvyn Novas  In 4 weeks and As needed   Please contact office for sooner follow up if symptoms do not improve or worsen or seek emergency care

## 2014-02-24 NOTE — Addendum Note (Signed)
Addended by: Oscar La R on: 02/24/2014 03:09 PM   Modules accepted: Orders

## 2014-02-24 NOTE — Progress Notes (Signed)
Subjective:    Patient ID: Jonathon [name], male    DOB: 10/04/1950  MRN: [id number]    Brief patient profile:  64 yo palest[place]  male smoker  dx 2010 with R PTX secondary to emphysematous blebs in [place] where underwent  RULobectomy for bullous dz   then felt better but then similar pain around first of sept 2015 with pain ant and post on R > went to ER with ? Lingular mass so referred to pulmonary clinic 10/08/2013 by Dr Coletta Memos with documented GOLD II copd with reversibility by pfts 11/05/2013     History of Present Illness  10/08/2013 1st [facility] office visit/ Wert   Chief Complaint  Patient presents with  . Pulmonary Consult    Self referral. Pt c/o right side pain and SOB x 1 month. Went to ED on 09/27/13 and CT Chest was performed. He states that last night the pain started moving across his chest. He gets SOB when he walks " for a while" and with walking up stairs. He is using albuterol inhaler 3-4 times per wk and neb maybe once per month.   cp better p rx with opioids, very similar to prev cp with ptx but now pain migrating over to L side, better lying flat, not really pleuritic, some cough and hoarseness chronically on acei but "no worse than usual"  Worse pain is with R arm abduction and Int rotation rec Stop lisinopril Start Avapro(Ibesartan) 150 mg one daily - if blood pressure too high (over 140/90)  can take it twice daily  The pain under you ribs that is working over to the left side is probably gas pain. Treatment consists of avoiding foods that cause gas (especially beans and boiled eggs  and raw vegetables like spinach and salads)  and citrucel powder in the tall can take 1 heaping tsp twice daily with a large glass of water.  Pain should improve w/in 2 weeks.   11/05/2013 f/u ov/Wert re: GOLD II copd with reversibility/ still occ cigarette Chief Complaint  Patient presents with  . Follow-up    Pt states that his breathing has been worse for the past 3  wks. He also c/o "right lung pain"- bothers him when he sleeps on his rt side.  He is using rescue inhaler about 1-2 times per day and has used neb several times since the last visit.   says voice is the same for years Does ok at [employer] but not [employer]   Uses hc sp cva  Very confused with various inhalers Still on labetolol  Uses wife's inhalers but very poor at [facility]fa/ still smoking rec Stop the naprosyn and labetolol Take your omeprazole 40 mg Take 30- 60 min before your first and last meals of the day  Only use your albuterol(proair or nebulizer) as a rescue medication  F/u with med calendar > never done    01/27/2014 f/u ov/Wert re: copd GOLDII  still smoking/ with variable doe, no longer on gerd rx,  seeing multiple providers, confused with meds  Chief Complaint  Patient presents with  . Follow-up    Pt states started having chest tightness and increased SOB 01/12/14- went to ED the following day, but was not admitted. He states that his chest tightness is now gone and SOB back to baseline. He is using rescue inhaler and neb on average 1 x per wk.   not really sure the saba helps as doesn't use it when has  A bad day with his breathing. >> No changes   02/24/2014 follow-up and medication review: COPD Gold 2 active smoker Patient returns for a one-month follow-up We reviewed all his medications organize them into a medication calendar with patient education Patient had previously been instructed to stop labetalol. However, continues to take this We discussed changing this due to its potential cough/wheezing in COPD/asthma pt.  He denies any chest pain, orthopnea, PND or leg swelling He remains on Symbicort 2 puffs twice daily  . Overall, says his breathing is stable without flare of cough. Gets winded with activity, no significant shortness of breath at rest     Current Medications, Allergies, Complete Past Medical History, Past Surgical History, Family History, and Social  History were reviewed in [employer] record.  ROS  The following are not active complaints unless bolded sore throat, dysphagia, dental problems, itching, sneezing,  nasal congestion or excess/ purulent secretions, ear ache,   fever, chills, sweats, unintended wt loss, pleuritic or exertional cp, hemoptysis,  orthopnea pnd or leg swelling, presyncope, palpitations, heartburn, abdominal pain, anorexia, nausea, vomiting, diarrhea  or change in bowel or urinary habits, change in stools or urine, dysuria,hematuria,  rash, arthralgias R shoulder, visual complaints, headache, numbness weakness or ataxia or problems with walking or coordination,  change in mood/affect or memory.            Objective:   Physical Exam  11/05/2013      216  > 01/27/2014 214 , 213/ 02/24/2014   amb   nad  HEENT mild turbinate edema.  Oropharynx no thrush or excess pnd or cobblestoning.  No JVD or cervical adenopathy. Mild accessory muscle hypertrophy. Trachea midline, nl thryroid. Chest was hyperinflated by percussion with diminished breath sounds and moderate increased exp time without wheeze. Hoover sign positive at mid inspiration. Regular rate and rhythm without murmur gallop or rub or increase P2 or edema.  Abd: no hsm, nl excursion. Ext warm without cyanosis or clubbing.       Ct chest no contrast 12/25/13 1. Minimally less prominent region of nodularity along the scarring in the lingula. There is been some chronic scarring in this area for at least 5 years, although it has been more nodular over the past few months. It could be that this represents scarring and adjacent rounded atelectasis particularly given the thick radiation extending to the hilum which is characteristic of rounded atelectasis. If PET-CT is not elected, I would suggest a follow up CT chest in 6-9 months in order to ensure continued stability. 2. Prior right upper lobectomy. 3. Markedly severe emphysema. I doubt that the  patient has much respiratory reserve           Assessment & Plan:

## 2014-02-24 NOTE — Assessment & Plan Note (Signed)
Recurrent flare in active smoker  Advised on smoking cessation  Patient's medications were reviewed today and patient education was given. Computerized medication calendar was adjusted/completed   Plan  Follow med calendar closely and bring to each visit. Follow up Dr. Melvyn Novas  In 4 weeks and As needed   Please contact office for sooner follow up if symptoms do not improve or worsen or seek emergency care

## 2014-02-24 NOTE — Assessment & Plan Note (Signed)
On high dose nonselective BB -labetolol -advised to stop previously but did not  Will stop now and change to selective BB -bisoprolol  Possible causing more wheezing in lung pt .   Plan  Stop Labetalol .  Begin Bisoprolol 10mg  Twice daily   Follow med calendar closely and bring to each visit. Follow up Dr. Melvyn Novas  In 4 weeks and As needed   Please contact office for sooner follow up if symptoms do not improve or worsen or seek emergency care

## 2014-02-28 ENCOUNTER — Encounter: Payer: Self-pay | Admitting: Gastroenterology

## 2014-03-06 NOTE — Addendum Note (Signed)
Addended by: Parke Poisson E on: 03/06/2014 01:14 PM   Modules accepted: Orders, Medications

## 2014-03-24 ENCOUNTER — Encounter: Payer: Self-pay | Admitting: Internal Medicine

## 2014-03-24 ENCOUNTER — Ambulatory Visit (INDEPENDENT_AMBULATORY_CARE_PROVIDER_SITE_OTHER): Payer: Medicare Other | Admitting: Internal Medicine

## 2014-03-24 VITALS — BP 122/80 | HR 73 | Ht 71.0 in | Wt 212.0 lb

## 2014-03-24 DIAGNOSIS — F17210 Nicotine dependence, cigarettes, uncomplicated: Secondary | ICD-10-CM

## 2014-03-24 DIAGNOSIS — J449 Chronic obstructive pulmonary disease, unspecified: Secondary | ICD-10-CM | POA: Diagnosis not present

## 2014-03-24 DIAGNOSIS — F17200 Nicotine dependence, unspecified, uncomplicated: Secondary | ICD-10-CM

## 2014-03-24 NOTE — Progress Notes (Signed)
Subjective:    Patient ID: Jonathon [name], male    DOB: [birth year]  MRN: [id number]    Brief patient profile:  64 yo palestinian  male smoker  dx 2010 with R PTX secondary to emphysematous blebs in [place] where underwent  RULobectomy for bullous dz   then felt better but then similar pain around first of sept 2015 with pain ant and post on R > went to ER with ? Lingular mass so referred to pulmonary clinic 10/08/2013 by Dr Coletta Memos with documented GOLD II copd with reversibility by pfts 11/05/2013     History of Present Illness  10/08/2013 1st [facility] office visit/ Jonathon [name]   Chief Complaint  Patient presents with  . Pulmonary Consult    Self referral. Pt c/o right side pain and SOB x 1 month. Went to ED on 09/27/13 and CT Chest was performed. He states that last night the pain started moving across his chest. He gets SOB when he walks " for a while" and with walking up stairs. He is using albuterol inhaler 3-4 times per wk and neb maybe once per month.   cp better p rx with opioids, very similar to prev cp with ptx but now pain migrating over to L side, better lying flat, not really pleuritic, some cough and hoarseness chronically on acei but "no worse than usual"  Worse pain is with R arm abduction and Int rotation rec Stop lisinopril Start Avapro(Ibesartan) 150 mg one daily - if blood pressure too high (over 140/90)  can take it twice daily  The pain under you ribs that is working over to the left side is probably gas pain. Treatment consists of avoiding foods that cause gas (especially beans and boiled eggs  and raw vegetables like spinach and salads)  and citrucel powder in the tall can take 1 heaping tsp twice daily with a large glass of water.  Pain should improve w/in 2 weeks.   11/05/2013 f/u ov/Jonathon [name] re: GOLD II copd with reversibility/ still occ cigarette Chief Complaint  Patient presents with  . Follow-up    Pt states that his breathing has been worse for the past 3  wks. He also c/o "right lung pain"- bothers him when he sleeps on his rt side.  He is using rescue inhaler about 1-2 times per day and has used neb several times since the last visit.   says voice is the same for years Does ok at [employer] but not [employer]   Uses [facility] sp cva  Very confused with various inhalers Still on labetolol  Uses wife's inhalers but very poor at [facility]/ still smoking rec Stop the naprosyn and labetolol Take your omeprazole 40 mg Take 30- 60 min before your first and last meals of the day  Only use your albuterol(proair or nebulizer) as a rescue medication  F/u with med calendar     02/24/2014 NP follow-up and medication review: COPD Gold 2 active smoker Patient returns for a one-month follow-up We reviewed all his medications organize them into a medication calendar with patient education Patient had previously been instructed to stop labetalol. However, continues to take this We discussed changing this due to its potential cough/wheezing in COPD/asthma pt.  He denies any chest pain, orthopnea, PND or leg swelling He remains on Symbicort 2 puffs twice daily  Overall, says his breathing is stable without flare of cough. Gets winded with activity, no significant shortness of breath at rest rec Stop Labetalol .  Begin Bisoprolol 10mg  Twice daily   Follow med calendar closely and bring to each visit. Follow up Dr. Melvyn Novas  In 4 weeks and As needed       03/24/2014 f/u ov/Jonathon [name] re: COPD  GOLD II with reversibility /still smoking esp p meals/coffee  using saba one puff before exertion / never neb/ using med calendar  Chief Complaint  Patient presents with  . Follow-up    Pt states that his breathing is unchanged since last visit. No new co's today.    only problem is steps cause sob now, not walking flat Never took bisoprol but says stopped labetolol (note bp and pulse still quite low)    No obvious day to day or daytime variabilty or assoc chronic cough or cp or chest  tightness, subjective wheeze overt sinus or hb symptoms. No unusual exp hx or h/o childhood pna/ asthma or knowledge of premature birth.  Sleeping ok without nocturnal  or early am exacerbation  of respiratory  c/o's or need for noct saba. Also denies any obvious fluctuation of symptoms with weather or environmental changes or other aggravating or alleviating factors except as outlined above   Current Medications, Allergies, Complete Past Medical History, Past Surgical History, Family History, and Social History were reviewed in [employer] record.  ROS  The following are not active complaints unless bolded sore throat, dysphagia, dental problems, itching, sneezing,  nasal congestion or excess/ purulent secretions, ear ache,   fever, chills, sweats, unintended wt loss, pleuritic or exertional cp, hemoptysis,  orthopnea pnd or leg swelling, presyncope, palpitations, heartburn, abdominal pain, anorexia, nausea, vomiting, diarrhea  or change in bowel or urinary habits, change in stools or urine, dysuria,hematuria,  rash, arthralgias, visual complaints, headache, numbness weakness or ataxia or problems with walking or coordination,  change in mood/affect or memory.              Objective:   Physical Exam  11/05/2013      216  > 01/27/2014 214 , 213/ 02/24/2014 > 03/24/2014  212   amb   Nad/ extremely hoarse   HEENT mild turbinate edema.  Oropharynx no thrush or excess pnd or cobblestoning.  No JVD or cervical adenopathy. Mild accessory muscle hypertrophy. Trachea midline, nl thryroid. Chest was hyperinflated by percussion with diminished breath sounds and moderate increased exp time with late exp bilateral wheeze. Hoover sign positive at mid inspiration. Regular rate and rhythm without murmur gallop or rub or increase P2 or edema.  Abd: no hsm, nl excursion. Ext warm without cyanosis or clubbing.       I personally reviewed images and agree with radiology impression as follows:   CXR:  01/14/15  Advanced COPD. No acute superimposed findings.            Assessment & Plan:

## 2014-03-24 NOTE — Patient Instructions (Addendum)
Work on inhaler technique:  relax and gently blow all the way out then take a nice smooth deep breath back in, triggering the inhaler at same time you start breathing in.  Hold for up to 5 seconds if you can.  Rinse and gargle with water when done     symbiocort 160 Take 2 puffs first thing in am and then another 2 puffs about 12 hours later.   Only use your albuterol (proair) as a rescue medication to be used if you can't catch your breath by resting or doing a relaxed purse lip breathing pattern.  - The less you use it, the better it will work when you need it. - Ok to use up to 2 puffs  every 4 hours if you must but call for immediate appointment if use goes up over your usual need - Don't leave home without it !!  (think of it like the spare tire for your car)   Only use the nebulizer if you try the proair and it fails to work   The key is to stop smoking completely before smoking completely stops you!   Please schedule a follow up visit in 3 months but call sooner if needed  Late add consider stiolto or spiriva plus symb next ov if not happy

## 2014-03-24 NOTE — Assessment & Plan Note (Signed)

## 2014-03-24 NOTE — Assessment & Plan Note (Signed)
-    PFT's 11/05/2013  FEV1 1.84 (54%) ratio 52 p 23% improvement from saba and dlco 62 corrects to 76% on labetolol (d/c'd) - 11/05/2013  Walked RA x 3 laps @ 185 ft each stopped due to  Slow pace/ no desat or sign sob  - 01/27/2014  Walked RA x 3 laps @ 185 ft each stopped due to  End of study, no sob, nl pace    DDX of  difficult airways management all start with A and  include Adherence, Ace Inhibitors, Acid Reflux, Active Sinus Disease, Alpha 1 Antitripsin deficiency, Anxiety masquerading as Airways dz,  ABPA,  allergy(esp in young), Aspiration (esp in elderly), Adverse effects of DPI,  Active smokers, plus two Bs  = Bronchiectasis and Beta blocker use..and one C= CHF  Adherence is always the initial "prime suspect" and is a multilayered concern that requires a "trust but verify" approach in every patient - starting with knowing how to use medications, especially inhalers, correctly, keeping up with refills and understanding the fundamental difference between maintenance and prns vs those medications only taken for a very short course and then stopped and not refilled.  - has med calendar now but not really sure he uses it appropriately - The proper method of use, as well as anticipated side effects, of a metered-dose inhaler are discussed and demonstrated to the patient. Improved effectiveness after extensive coaching during this visit to a level of approximately  75% so still needs work   ? BB > I'm doubtful he stopped BB at this point as still low bp and pulse  Active smoking clearly biggest concern > see sep a/p    Each maintenance medication was reviewed in detail including most importantly the difference between maintenance and as needed and under what circumstances the prns are to be used. This was done in the context of a medication calendar review which provided the patient with a user-friendly unambiguous mechanism for medication administration and reconciliation and provides an action  plan for all active problems. It is critical that this be shown to every doctor  for modification during the office visit if necessary so the patient can use it as a working document.

## 2014-03-27 ENCOUNTER — Emergency Department (HOSPITAL_COMMUNITY)
Admission: EM | Admit: 2014-03-27 | Discharge: 2014-03-28 | Disposition: A | Discharge status: Home / Self Care | Payer: Medicare Other | Attending: Emergency Medicine | Admitting: Emergency Medicine

## 2014-03-27 ENCOUNTER — Emergency Department (HOSPITAL_COMMUNITY): Payer: Medicare Other

## 2014-03-27 ENCOUNTER — Encounter (HOSPITAL_COMMUNITY): Payer: Self-pay

## 2014-03-27 DIAGNOSIS — Z720 Tobacco use: Secondary | ICD-10-CM | POA: Diagnosis not present

## 2014-03-27 DIAGNOSIS — R5383 Other fatigue: Secondary | ICD-10-CM | POA: Diagnosis not present

## 2014-03-27 DIAGNOSIS — Z7951 Long term (current) use of inhaled steroids: Secondary | ICD-10-CM | POA: Insufficient documentation

## 2014-03-27 DIAGNOSIS — I5030 Unspecified diastolic (congestive) heart failure: Secondary | ICD-10-CM | POA: Diagnosis not present

## 2014-03-27 DIAGNOSIS — R059 Cough, unspecified: Secondary | ICD-10-CM

## 2014-03-27 DIAGNOSIS — R05 Cough: Secondary | ICD-10-CM | POA: Diagnosis not present

## 2014-03-27 DIAGNOSIS — Z8673 Personal history of transient ischemic attack (TIA), and cerebral infarction without residual deficits: Secondary | ICD-10-CM | POA: Diagnosis not present

## 2014-03-27 DIAGNOSIS — Z7982 Long term (current) use of aspirin: Secondary | ICD-10-CM | POA: Insufficient documentation

## 2014-03-27 DIAGNOSIS — R079 Chest pain, unspecified: Secondary | ICD-10-CM

## 2014-03-27 DIAGNOSIS — Z79899 Other long term (current) drug therapy: Secondary | ICD-10-CM | POA: Insufficient documentation

## 2014-03-27 DIAGNOSIS — R0602 Shortness of breath: Secondary | ICD-10-CM

## 2014-03-27 DIAGNOSIS — J441 Chronic obstructive pulmonary disease with (acute) exacerbation: Secondary | ICD-10-CM | POA: Diagnosis not present

## 2014-03-27 DIAGNOSIS — I10 Essential (primary) hypertension: Secondary | ICD-10-CM | POA: Insufficient documentation

## 2014-03-27 DIAGNOSIS — Z8739 Personal history of other diseases of the musculoskeletal system and connective tissue: Secondary | ICD-10-CM | POA: Diagnosis not present

## 2014-03-27 DIAGNOSIS — Z9889 Other specified postprocedural states: Secondary | ICD-10-CM | POA: Insufficient documentation

## 2014-03-27 LAB — CBC
HCT: 44.1 % (ref 39.0–52.0)
Hemoglobin: 14.9 g/dL (ref 13.0–17.0)
MCH: 29 pg (ref 26.0–34.0)
MCHC: 33.8 g/dL (ref 30.0–36.0)
MCV: 86 fL (ref 78.0–100.0)
Platelets: 216 10*3/uL (ref 150–400)
RBC: 5.13 MIL/uL (ref 4.22–5.81)
RDW: 14.6 % (ref 11.5–15.5)
WBC: 8.1 10*3/uL (ref 4.0–10.5)

## 2014-03-27 LAB — BASIC METABOLIC PANEL
Anion gap: 8 (ref 5–15)
BUN: 9 mg/dL (ref 6–23)
CO2: 25 mmol/L (ref 19–32)
Calcium: 8.8 mg/dL (ref 8.4–10.5)
Chloride: 105 mmol/L (ref 96–112)
Creatinine, Ser: 1.13 mg/dL (ref 0.50–1.35)
GFR calc Af Amer: 78 mL/min — ABNORMAL LOW (ref >90)
GFR calc non Af Amer: 67 mL/min — ABNORMAL LOW (ref >90)
Glucose, Bld: 135 mg/dL — ABNORMAL HIGH (ref 70–99)
Potassium: 3.7 mmol/L (ref 3.5–5.1)
Sodium: 138 mmol/L (ref 135–145)

## 2014-03-27 LAB — I-STAT TROPONIN, ED: Troponin i, poc: 0 ng/mL (ref 0.00–0.08)

## 2014-03-27 MED ORDER — ALBUTEROL SULFATE (2.5 MG/3ML) 0.083% IN NEBU
5.0000 mg | INHALATION_SOLUTION | Freq: Once | RESPIRATORY_TRACT | Status: AC
  Administered 2014-03-27: 5 mg via RESPIRATORY_TRACT
  Filled 2014-03-27: qty 6

## 2014-03-27 MED ORDER — IPRATROPIUM-ALBUTEROL 0.5-2.5 (3) MG/3ML IN SOLN
3.0000 mL | Freq: Once | RESPIRATORY_TRACT | Status: AC
  Administered 2014-03-27: 3 mL via RESPIRATORY_TRACT
  Filled 2014-03-27: qty 3

## 2014-03-27 MED ORDER — PREDNISONE 20 MG PO TABS
60.0000 mg | ORAL_TABLET | Freq: Once | ORAL | Status: AC
  Administered 2014-03-27: 60 mg via ORAL
  Filled 2014-03-27: qty 3

## 2014-03-27 NOTE — ED Notes (Signed)
Pt with chest pain that started yesterday.  Sts pt is associated with a cough.  Pain described as squeezing and burning.  Pt has been coughing up some sputum but does not know the color.  Denies fevers at home.

## 2014-03-27 NOTE — ED Provider Notes (Signed)
CSN: [id number]     Arrival date & time 03/27/14  1815 History   First MD Initiated Contact with Patient 03/27/14 2206     Chief Complaint  Patient presents with  . Chest Pain     (Consider location/radiation/quality/duration/timing/severity/associated sxs/prior Treatment) The history is provided by the patient and medical records.    This is a 64 year old male with past medical history significant for hypertension, prior CVA, chronic cough, COPD, exertional shortness of breath, presenting to the ED for chest pain. States pain began yesterday and has worsened with coughing. He describes his pain as a squeezing and burning sensation. He states he is always short of breath, but states it may have worsened over the past 24 hours. Cough is productive with sputum. He denies any fever or chills at home. He does state he feels fatigued. Patient has no known history of CAD or MI. He continues to be a daily smoker.  Patient has had multiple cardiac caths in the past, non-obstructive disease noted.  He does not see cardiology regularly.  VSS.  Past Medical History  Diagnosis Date  . Hypertension   . Smoker   . History of CVA (cerebrovascular accident) 02-22-2011    LEFT BRAIN STEM PONTINE HEMORRHAGE--  RIGHT SIDED WEAKNESS  . History of CHF (congestive heart failure) MONITORED BY DR BOUSKA    DIASTOLIC  . Hydrocele, left   . Chronic cough   . Short of breath on exertion   . Harsh voice quality     PT STATES NORMAL FOR HIM  . COPD, severe     PT DENIES REGULAR DAILY INHALER ANY PRN  . Pre-operative clearance     GIVEN BY DR Coletta Memos  . Weakness of right side of body     S/P CVA   FEB 2013   Past Surgical History  Procedure Laterality Date  . Thoracotomy/lobectomy Right 03-06-2008    AND STAPLING OF BLEBS FOR SPONTANOUS PNEUMOTHORAX  . Cardiac catheterization  05-02-2011  DR NISHAN    MILD NONOBSTRUCTIVE CAD/ LAD, OM , AND D1/ EF 60^  . Cardiac catheterization  AUG 2000    NON-OBSTRUTIVE  CAD/ EF 68%/ 25% PROXIMAL LAD/ 20% MID CIRCUMFLEX  . Transthoracic echocardiogram  02-17-2011    MODERATE LVH/ LVSF NORMAL/ EF 89-21%/ GRADE I DIASTOLIC DYSFUNCTION  . Inguinal hernia repair Left   . Hydrocele excision Left 04/24/2012    Procedure: HYDROCELECTOMY ADULT;  Surgeon: Fredricka Bonine, MD;  Location[facility];  Service: Urology;  Laterality: Left;  90 mins req for this case     . Left heart catheterization with coronary angiogram [name] 05/02/2011    Procedure: LEFT HEART CATHETERIZATION WITH CORONARY ANGIOGRAM;  Surgeon: Josue Hector, MD;  Location: [facility] CATH LAB;  Service: Cardiovascular;  Laterality: [name];  . Left and right heart catheterization with coronary angiogram [name] 09/04/2012    Procedure: LEFT AND RIGHT HEART CATHETERIZATION WITH CORONARY ANGIOGRAM;  Surgeon: Laverda Page, MD;  Location: [facility] CATH LAB;  Service: Cardiovascular;  Laterality: [name];  . Abdominal angiogram  09/04/2012    Procedure: ABDOMINAL ANGIOGRAM;  Surgeon: Laverda Page, MD;  Location: [facility] CATH LAB;  Service: Cardiovascular;;   Family History  Problem Relation Age of Onset  . Hypertension Mother   . Lung disease Father     died of "colapsed lung" per patient  . Asthma Father   . Colon cancer Neg Hx   . Stomach cancer Neg Hx    History  Substance Use Topics  . Smoking status: Current Every Day Smoker -- 1.00 packs/day for 48 years    Types: Cigarettes  . Smokeless tobacco: Never Used     Comment: 1/2 ppd 01/27/14  . Alcohol Use: 0.0 oz/week    0 Standard drinks or equivalent per week     Comment: OCCASIONAL--      Review of Systems  Constitutional: Positive for fatigue.  Respiratory: Positive for cough and shortness of breath.   Cardiovascular: Positive for chest pain.  All other systems reviewed and are negative.     Allergies  Review of patient's allergies indicates no known allergies.  Home Medications   Prior to Admission medications   Medication Sig  Start Date End Date Taking? Authorizing Provider  albuterol (PROVENTIL) (2.5 MG/3ML) 0.083% nebulizer solution Take 2.5 mg by nebulization every 4 (four) hours as needed for wheezing or shortness of breath.   Yes Historical Provider, MD  Albuterol Sulfate (PROAIR RESPICLICK) 938 (90 BASE) MCG/ACT AEPB Inhale 2 puffs into the lungs 4 (four) times daily as needed. 02/24/14  Yes Tammy S Parrett, NP  amLODipine (NORVASC) 10 MG tablet Take 10 mg by mouth daily.   Yes Historical Provider, MD  aspirin EC 325 MG tablet Take 325 mg by mouth daily.   Yes Historical Provider, MD  atorvastatin (LIPITOR) 10 MG tablet Take 5 mg by mouth daily at 6 PM. 1/2 tab by mouth once daily   Yes Historical Provider, MD  budesonide-formoterol (SYMBICORT) 160-4.5 MCG/ACT inhaler Inhale 2 puffs into the lungs 2 (two) times daily.   Yes Historical Provider, MD  cloNIDine (CATAPRES) 0.3 MG tablet Take 0.3 mg by mouth 2 (two) times daily.   Yes Historical Provider, MD  cyclobenzaprine (FLEXERIL) 5 MG tablet Take 5 mg by mouth at bedtime as needed for muscle spasms.  02/03/14  Yes Historical Provider, MD  DULoxetine (CYMBALTA) 30 MG capsule Take 30 mg by mouth daily. 02/03/14  Yes Historical Provider, MD  gabapentin (NEURONTIN) 400 MG capsule Take 400 mg by mouth 3 (three) times daily.    Yes Historical Provider, MD  irbesartan (AVAPRO) 150 MG tablet Take 1 tablet (150 mg total) by mouth daily. 10/08/13  Yes Tanda Rockers, MD  metFORMIN (GLUCOPHAGE) 500 MG tablet Take 500 mg by mouth daily with breakfast.   Yes Historical Provider, MD  nitroGLYCERIN (NITROSTAT) 0.4 MG SL tablet Place 0.4 mg under the tongue every 5 (five) minutes as needed for chest pain.   Yes Historical Provider, MD  nortriptyline (PAMELOR) 25 MG capsule Take 25 mg by mouth at bedtime.   Yes Historical Provider, MD  omeprazole (PRILOSEC) 20 MG capsule Take 20 mg by mouth 2 (two) times daily before a meal.   Yes Historical Provider, MD  Psyllium 48.57 % POWD Take 1  packet by mouth daily.    Yes Historical Provider, MD  zolpidem (AMBIEN) 5 MG tablet Take 5 mg by mouth at bedtime as needed for sleep.   Yes Historical Provider, MD  bisoprolol (ZEBETA) 10 MG tablet TAKE 1 TABLET BY MOUTH TWICE DAILY Patient not taking: Reported on 03/27/2014 02/26/14   Tammy S Parrett, NP   BP 136/77 mmHg  Pulse 89  Temp(Src) 98.6 F (37 C)  Resp 18  SpO2 94%   Physical Exam  Constitutional: He is oriented to person, place, and time. He appears well-developed and well-nourished. No distress.  HENT:  Head: Normocephalic and atraumatic.  Right Ear: Tympanic membrane and ear canal normal.  Left Ear: Tympanic membrane and ear canal normal.  Nose: Nose normal.  Mouth/Throat: Uvula is midline, oropharynx is clear and moist and mucous membranes are normal. No oropharyngeal exudate, posterior oropharyngeal edema, posterior oropharyngeal erythema or tonsillar abscesses.  Eyes: Conjunctivae and EOM are normal. Pupils are equal, round, and reactive to light.  Neck: Normal range of motion.  Cardiovascular: Normal rate, regular rhythm and normal heart sounds.   Pulmonary/Chest: Effort normal and breath sounds normal. No respiratory distress.  Diminished breath sounds bilaterally, no distress, speaking in full complete sentences without difficulty  Abdominal: Soft. Bowel sounds are normal.  Musculoskeletal: Normal range of motion.  Neurological: He is alert and oriented to person, place, and time.  Skin: Skin is warm and dry. He is not diaphoretic.  Psychiatric: He has a normal mood and affect.  Nursing note and vitals reviewed.   ED Course  Procedures (including critical care time) Labs Review Labs Reviewed  BASIC METABOLIC PANEL - Abnormal; Notable for the following:    Glucose, Bld 135 (*)    GFR calc non Af Amer 67 (*)    GFR calc Af Amer 78 (*)    All other components within normal limits  CBC  I-STAT TROPOININ, ED    Imaging Review Dg Chest 2 View  03/27/2014    CLINICAL DATA:  Left-sided chest pain and weakness since yesterday.  EXAM: CHEST  2 VIEW  COMPARISON:  01/13/2014  FINDINGS: Normal heart size and pulmonary vascularity. Prominent emphysematous changes in the lungs with large central bulla on the left. Probable bullous changes in the apices. Surgical clips in the right hilum. No focal airspace disease or consolidation in the lungs. No blunting of costophrenic angles. Tortuous and calcified aorta. No change since prior study.  IMPRESSION: Prominent emphysematous changes in the lungs. No evidence of active pulmonary disease.   Electronically Signed   By: Lucienne Capers [name]   On: 03/27/2014 20:49     EKG Interpretation None      MDM   Final diagnoses:  Chest pain, unspecified chest pain type  Shortness of breath  Cough   64 year old male here with chest pain, cough, shortness of breath since yesterday. Chest pain seems more pronounced with cough. On exam, patient is afebrile and nontoxic in appearance. He has diminished breath sounds throughout, productive cough noted. EKG without acute ischemia. Lab work is reassuring, troponin negative. Chest x-ray with emphysematous changes but no acute infiltrate. Patient given dose of prednisone, DuoNeb, and albuterol neb with improvement of his breathing. His vital signs have remained stable on room air. His chest pain seems related to his cough.  Lower suspicion for ACS, PE, dissection, or other acute cardiac event at this time given ongoing pain for >24 hours and negative work-up in ED today.  Given patient's age and his multiple respiratory comorbidities, will cover for CAP with levaquin and prednisone.  Encouraged scheduled albuterol treatments every 4-6 hours at home for the next 1-2 days. Patient to follow-up with his primary care physician.  Discussed plan with patient, he/she acknowledged understanding and agreed with plan of care.  Return precautions given for new or worsening symptoms.  Case  discussed with attending physician, Dr. Jeneen Rinks, who agrees with assessment and plan of care.  Larene Pickett, PA-C 03/28/14 5621  Tanna Furry, MD 03/30/14 [phone]

## 2014-03-28 DIAGNOSIS — R079 Chest pain, unspecified: Secondary | ICD-10-CM | POA: Diagnosis not present

## 2014-03-28 MED ORDER — LEVOFLOXACIN 750 MG PO TABS: 750.0000 mg | ORAL_TABLET | Freq: Every day | ORAL | Status: DC

## 2014-03-28 MED ORDER — HYDROCOD POLST-CHLORPHEN POLST 10-8 MG/5ML PO LQCR
5.0000 mL | Freq: Once | ORAL | Status: AC
  Administered 2014-03-28: 5 mL via ORAL
  Filled 2014-03-28: qty 5

## 2014-03-28 MED ORDER — PREDNISONE 20 MG PO TABS: 40.0000 mg | ORAL_TABLET | Freq: Every day | ORAL | Status: DC

## 2014-03-28 NOTE — Discharge Instructions (Signed)
Take the prescribed medication as directed.  Recommend scheduled albuterol neb treatments every 4-6 hours for the next 1-2 days. Follow-up with your primary care physician. Return to the ED for new or worsening symptoms.

## 2014-06-18 ENCOUNTER — Other Ambulatory Visit: Payer: Self-pay | Admitting: Internal Medicine

## 2014-06-18 DIAGNOSIS — R9389 Abnormal findings on diagnostic imaging of other specified body structures: Secondary | ICD-10-CM

## 2014-06-27 ENCOUNTER — Ambulatory Visit (INDEPENDENT_AMBULATORY_CARE_PROVIDER_SITE_OTHER)
Admission: RE | Admit: 2014-06-27 | Discharge: 2014-06-27 | Disposition: A | Discharge status: Home / Self Care | Payer: Medicare HMO | Source: Ambulatory Visit | Attending: Internal Medicine | Admitting: Internal Medicine

## 2014-06-27 DIAGNOSIS — R9389 Abnormal findings on diagnostic imaging of other specified body structures: Secondary | ICD-10-CM

## 2014-06-27 DIAGNOSIS — R938 Abnormal findings on diagnostic imaging of other specified body structures: Secondary | ICD-10-CM

## 2014-06-27 NOTE — Progress Notes (Signed)
Quick Note:  LMTCB ______ 

## 2014-06-30 NOTE — Progress Notes (Signed)
Quick Note:  Spoke with pt and notified of results per Dr. Wert. Pt verbalized understanding and denied any questions.  ______ 

## 2014-07-14 ENCOUNTER — Other Ambulatory Visit: Payer: Self-pay

## 2014-07-28 ENCOUNTER — Ambulatory Visit (INDEPENDENT_AMBULATORY_CARE_PROVIDER_SITE_OTHER): Payer: Medicare HMO | Admitting: Internal Medicine

## 2014-07-28 ENCOUNTER — Encounter: Payer: Self-pay | Admitting: Internal Medicine

## 2014-07-28 VITALS — BP 106/70 | HR 90 | Ht 71.0 in | Wt 211.4 lb

## 2014-07-28 DIAGNOSIS — R938 Abnormal findings on diagnostic imaging of other specified body structures: Secondary | ICD-10-CM

## 2014-07-28 DIAGNOSIS — I10 Essential (primary) hypertension: Secondary | ICD-10-CM

## 2014-07-28 DIAGNOSIS — Z720 Tobacco use: Secondary | ICD-10-CM | POA: Diagnosis not present

## 2014-07-28 DIAGNOSIS — F17200 Nicotine dependence, unspecified, uncomplicated: Secondary | ICD-10-CM

## 2014-07-28 DIAGNOSIS — J449 Chronic obstructive pulmonary disease, unspecified: Secondary | ICD-10-CM

## 2014-07-28 DIAGNOSIS — F17210 Nicotine dependence, cigarettes, uncomplicated: Secondary | ICD-10-CM

## 2014-07-28 DIAGNOSIS — R9389 Abnormal findings on diagnostic imaging of other specified body structures: Secondary | ICD-10-CM

## 2014-07-28 NOTE — Progress Notes (Signed)
Subjective:    Patient ID: Jonathon [name], male    DOB: [birth year]  MRN: [id number]    Brief patient profile:  64 yo pal[place]  male smoker  dx 2010 with R PTX secondary to emphysematous blebs in [place] where underwent  RULobectomy for bullous dz   then felt better but then similar pain around first of sept 2015 with pain ant and post on R > went to ER with ? Lingular mass so referred to pulmonary clinic 10/08/2013 by Dr Coletta Memos with documented GOLD II copd with reversibility by pfts 11/05/2013     History of Present Illness  10/08/2013 1st [facility] office visit/ Jonathon [name]   Chief Complaint  Patient presents with  . Pulmonary Consult    Self referral. Pt c/o right side pain and SOB x 1 month. Went to ED on 09/27/13 and CT Chest was performed. He states that last night the pain started moving across his chest. He gets SOB when he walks " for a while" and with walking up stairs. He is using albuterol inhaler 3-4 times per wk and neb maybe once per month.   cp better p rx with opioids, very similar to prev cp with ptx but now pain migrating over to L side, better lying flat, not really pleuritic, some cough and hoarseness chronically on acei but "no worse than usual"  Worse pain is with R arm abduction and Int rotation rec Stop lisinopril Start Avapro(Ibesartan) 150 mg one daily - if blood pressure too high (over 140/90)  can take it twice daily  The pain under you ribs that is working over to the left side is probably gas pain. Treatment consists of avoiding foods that cause gas (especially beans and boiled eggs  and raw vegetables like spinach and salads)  and citrucel powder in the tall can take 1 heaping tsp twice daily with a large glass of water.  Pain should improve w/in 2 weeks.   11/05/2013 f/u ov/Jonathon [name] re: GOLD II copd with reversibility/ still occ cigarette Chief Complaint  Patient presents with  . Follow-up    Pt states that his breathing has been worse for the past 3  wks. He also c/o "right lung pain"- bothers him when he sleeps on his rt side.  He is using rescue inhaler about 1-2 times per day and has used neb several times since the last visit.   says voice is the same for years Does ok at [employer] but not [employer]   Uses hc sp cva  Very confused with various inhalers Still on labetolol  Uses wife's inhalers but very poor at hfa/ still smoking rec Stop the naprosyn and labetolol Take your omeprazole 40 mg Take 30- 60 min before your first and last meals of the day  Only use your albuterol(proair or nebulizer) as a rescue medication  F/u with med calendar     02/24/2014 NP follow-up and medication review: COPD Gold 2 active smoker Patient returns for a one-month follow-up We reviewed all his medications organize them into a medication calendar with patient education Patient had previously been instructed to stop labetalol. However, continues to take this We discussed changing this due to its potential cough/wheezing in COPD/asthma pt.  He denies any chest pain, orthopnea, PND or leg swelling He remains on Symbicort 2 puffs twice daily  Overall, says his breathing is stable without flare of cough. Gets winded with activity, no significant shortness of breath at rest rec Stop Labetalol .  Begin Bisoprolol '10mg'$  Twice daily   Follow med calendar closely and bring to each visit. Follow up Dr. Melvyn Novas  In 4 weeks and As needed       03/24/2014 f/u ov/Jonathon [name] re: COPD  GOLD II with reversibility /still smoking esp p meals/coffee  using saba one puff before exertion / never neb/ using med calendar  Chief Complaint  Patient presents with  . Follow-up    Pt states that his breathing is unchanged since last visit. No new co's today.    only problem is steps cause sob now, not walking flat Never took bisoprol but says stopped labetolol (note bp and pulse still quite low)  rec Work on inhaler technique:    Symbiocort 160 Take 2 puffs first thing in am and  then another 2 puffs about 12 hours later.  Only use your albuterol (proair) as a rescue medication  Only use the nebulizer if you try the proair and it fails to work  The key is to stop smoking completely before smoking completely stops you!  Please schedule a follow up visit in 3 months but call sooner if needed  Late add consider stiolto or spiriva plus symb next ov if not happy    07/28/2014 f/u ov/Jonathon [name] re: GOLD II/ still smoking /no med calendar  Chief Complaint  Patient presents with  . Follow-up    Pt states he is doing well with the Symbicort and his SOB has improved since starting med. Pt states he still does not have much energy. Pt c/o prod cough with white mucus. Pt denies CP/tightness.   MMRC 2  Can't walk nl pace even flat  Never need neb saba  Poor mdi / understanding of main vs prn  Cough mostly in ams Main concern is thinks bp meds too strong (catapres) and very fatigued/sluggish since starting it   No obvious day to day or daytime variabilty or assoc  cp or chest tightness, subjective wheeze overt sinus or hb symptoms. No unusual exp hx or h/o childhood pna/ asthma or knowledge of premature birth.  Sleeping ok without nocturnal  or early am exacerbation  of respiratory  c/o's or need for noct saba. Also denies any obvious fluctuation of symptoms with weather or environmental changes or other aggravating or alleviating factors except as outlined above   Current Medications, Allergies, Complete Past Medical History, Past Surgical History, Family History, and Social History were reviewed in [employer] record.  ROS  The following are not active complaints unless bolded sore throat, dysphagia, dental problems, itching, sneezing,  nasal congestion or excess/ purulent secretions, ear ache,   fever, chills, sweats, unintended wt loss, pleuritic or exertional cp, hemoptysis,  orthopnea pnd or leg swelling, presyncope, palpitations, heartburn, abdominal pain,  anorexia, nausea, vomiting, diarrhea  or change in bowel or urinary habits, change in stools or urine, dysuria,hematuria,  rash, arthralgias, visual complaints, headache, numbness weakness or ataxia or problems with walking or coordination,  change in mood/affect or memory.              Objective:   Physical Exam  11/05/2013      216  > 01/27/2014 214 , 213/ 02/24/2014 > 03/24/2014  212 > 07/28/14  211   amb   Nad/ extremely hoarse   HEENT mild turbinate edema.  Oropharynx no thrush or excess pnd or cobblestoning.  No JVD or cervical adenopathy. Mild accessory muscle hypertrophy. Trachea midline, nl thryroid. Chest was hyperinflated by percussion with diminished  breath sounds and moderate increased exp time with late exp bilateral wheeze. Hoover sign positive at mid inspiration. Regular rate and rhythm without murmur gallop or rub or increase P2 or edema.  Abd: no hsm, nl excursion. Ext warm without cyanosis or clubbing.       I personally reviewed images and agree with radiology impression as follows:  CT s contrast 06/27/14 1. Stable pleural-based nodular density in the medial aspect of the lingula is strongly favored to be benign. Since a pleural based lesion has been present in this region on prior studies dating back to at least 09/24/2009, and this lesion has been stable over nearly a 1 year time interval, this is presumably an area of rounded atelectasis. 2. Status post right upper lobectomy. 3. Diffuse bronchial wall thickening with moderate to severe centrilobular and paraseptal emphysema with extensive bullous changes.            Assessment & Plan:

## 2014-07-28 NOTE — Patient Instructions (Addendum)
Reduce catapres to one half in am but continue full dose in evening   Symbicort 160 Take 2 puffs first thing in am and then another 2 puffs about 12 hours later.   Work on inhaler technique:  relax and gently blow all the way out then take a nice smooth deep breath back in, triggering the inhaler at same time you start breathing in.  Hold for up to 5 seconds if you can.  Rinse and gargle with water when done    Only use your albuterol as a rescue medication to be used if you can't catch your breath by resting or doing a relaxed purse lip breathing pattern.  - The less you use it, the better it will work when you need it. - Ok to use up to 2 puffs  every 4 hours if you must but call for immediate appointment if use goes up over your usual need - Don't leave home without it !!  (think of it like the spare tire for your car)    See Tammy NP w/in 3 months  with all your medications, even over the counter meds, separated in two separate bags, the ones you take no matter what vs the ones you stop once you feel better and take only as needed when you feel you need them.   Tammy  will generate for you a new user friendly medication calendar that will put [place] all on the same page re: your medication use.     Without this process, it simply isn't possible to assure that we are providing  your outpatient care  with  the attention to detail we feel you deserve.   If we cannot assure that you're getting that kind of care,  then we cannot manage your problem effectively from this clinic.  Once you have seen Tammy and we are sure that we're all on the same page with your medication use she will arrange follow up with me.

## 2014-07-29 NOTE — Assessment & Plan Note (Signed)
-    PFT's 11/05/2013  FEV1 1.84 (54%) ratio 52 p 23% improvement from saba and dlco 62 corrects to 76% on labetolol (d/c'd) - 11/05/2013  Walked RA x 3 laps @ 185 ft each stopped due to  Slow pace/ no desat or sign sob  - 01/27/2014  Walked RA x 3 laps @ 185 ft each stopped due to  End of study, no sob, nl pace    The proper method of use, as well as anticipated side effects, of a metered-dose inhaler are discussed and demonstrated to the patient. Improved effectiveness after extensive coaching during this visit to a level of approximately     75% from a baseline of 25%   Should be better than MMRC2 with fev1 of almost 2 liters but clearly has moderate copd that is likely progressing at high rate as still smoking   I had an extended discussion with the patient reviewing all relevant studies completed to date and  lasting 15 to 20 minutes of a 25 minute visit    He is easily confused with details of care and not using med calendar provided   To keep things simple, I have asked the patient to first separate medicines that are perceived as maintenance, that is to be taken daily "no matter what", from those medicines that are taken on only on an as-needed basis and I have given the patient examples of both, and then return to see our NP to generate a  detailed  medication calendar which should be followed until the next physician sees the patient and updates it.    In meantime:  Each maintenance medication was reviewed in detail including most importantly the difference between maintenance and prns and under what circumstances the prns are to be triggered using an action plan format that is not reflected in the computer generated alphabetically organized AVS.    Please see instructions for details which were reviewed in writing and the patient given a copy highlighting the part that I personally wrote and discussed at today's ov.

## 2014-07-29 NOTE — Assessment & Plan Note (Signed)

## 2014-07-29 NOTE — Assessment & Plan Note (Signed)
Changed acei to arb 10/09/2013 due to hoarseness and chronic cough  - d/cd labetolol due to reversible airflow obst 11/05/2013   ? Overly Adequate control on present rx, reviewed options > since so sluggish in am rec reduce am clonidine by one half of a 0.'3mg'$  dose

## 2014-07-29 NOTE — Assessment & Plan Note (Signed)
S/p RULobectomy 03/07/08 for bullous dz with PTX See CT 09/27/13 1. No CT findings for pulmonary embolism.  2. Tortuosity, ectasia and calcification of the thoracic aorta but  no dissection.  3. Severe bullous emphysema.  4. Soft tissue mass in the lingula adjacent to the heart could be  compressive atelectasis  -  Repeat CT 12/25/13 no mass  -  reCT 06/27/14 > . Stable pleural-based nodular density in the medial aspect of the lingula is strongly favored to be benign. Since a pleural based lesion has been present in this region on prior studies dating back to at least 09/24/2009, and this lesion has been stable over nearly a 1 year time interval, this is presumably an area of rounded atelectasis. 2. Status post right upper lobectomy. rec No further f/u

## 2014-10-22 ENCOUNTER — Other Ambulatory Visit: Payer: Self-pay | Admitting: Internal Medicine

## 2014-10-27 ENCOUNTER — Ambulatory Visit (INDEPENDENT_AMBULATORY_CARE_PROVIDER_SITE_OTHER): Payer: Medicare HMO | Admitting: Adult Health

## 2014-10-27 ENCOUNTER — Encounter: Payer: Self-pay | Admitting: Adult Health

## 2014-10-27 VITALS — BP 136/86 | HR 83 | Temp 98.3°F | Ht 72.0 in | Wt 216.0 lb

## 2014-10-27 DIAGNOSIS — J449 Chronic obstructive pulmonary disease, unspecified: Secondary | ICD-10-CM | POA: Diagnosis not present

## 2014-10-27 DIAGNOSIS — Z23 Encounter for immunization: Secondary | ICD-10-CM

## 2014-10-27 NOTE — Assessment & Plan Note (Addendum)
Compensated on present regimenn Plan  flu shot today    Follow med calendar closely and bring to each visit. Follow up Dr. Melvyn Novas  In 3 months  And As needed     Please contact office for sooner follow up if symptoms do not improve or worsen or seek emergency care

## 2014-10-27 NOTE — Progress Notes (Signed)
Subjective:    Patient ID: Jonathon [name], male    DOB: [birth year]  MRN: [id number]    Brief patient profile:  64 yo pal[place]  male smoker  dx 2010 with R PTX secondary to emphysematous blebs in [place] where underwent  RULobectomy for bullous dz   then felt better but then similar pain around first of sept 2015 with pain ant and post on R > went to ER with ? Lingular mass so referred to pulmonary clinic 10/08/2013 by Dr Coletta Memos with documented GOLD II copd with reversibility by pfts 11/05/2013     History of Present Illness  10/08/2013 1st [facility] office visit/ Wert   Chief Complaint  Patient presents with  . Pulmonary Consult    Self referral. Pt c/o right side pain and SOB x 1 month. Went to ED on 09/27/13 and CT Chest was performed. He states that last night the pain started moving across his chest. He gets SOB when he walks " for a while" and with walking up stairs. He is using albuterol inhaler 3-4 times per wk and neb maybe once per month.   cp better p rx with opioids, very similar to prev cp with ptx but now pain migrating over to L side, better lying flat, not really pleuritic, some cough and hoarseness chronically on acei but "no worse than usual"  Worse pain is with R arm abduction and Int rotation rec Stop lisinopril Start Avapro(Ibesartan) 150 mg one daily - if blood pressure too high (over 140/90)  can take it twice daily  The pain under you ribs that is working over to the left side is probably gas pain. Treatment consists of avoiding foods that cause gas (especially beans and boiled eggs  and raw vegetables like spinach and salads)  and citrucel powder in the tall can take 1 heaping tsp twice daily with a large glass of water.  Pain should improve w/in 2 weeks.   11/05/2013 f/u ov/Wert re: GOLD II copd with reversibility/ still occ cigarette Chief Complaint  Patient presents with  . Follow-up    Pt states that his breathing has been worse for the past 3  wks. He also c/o "right lung pain"- bothers him when he sleeps on his rt side.  He is using rescue inhaler about 1-2 times per day and has used neb several times since the last visit.   says voice is the same for years Does ok at [employer] but not [employer]   Uses hc sp cva  Very confused with various inhalers Still on labetolol  Uses wife's inhalers but very poor at hfa/ still smoking rec Stop the naprosyn and labetolol Take your omeprazole 40 mg Take 30- 60 min before your first and last meals of the day  Only use your albuterol(proair or nebulizer) as a rescue medication  F/u with med calendar     02/24/2014 NP follow-up and medication review: COPD Gold 2 active smoker Patient returns for a one-month follow-up We reviewed all his medications organize them into a medication calendar with patient education Patient had previously been instructed to stop labetalol. However, continues to take this We discussed changing this due to its potential cough/wheezing in COPD/asthma pt.  He denies any chest pain, orthopnea, PND or leg swelling He remains on Symbicort 2 puffs twice daily  Overall, says his breathing is stable without flare of cough. Gets winded with activity, no significant shortness of breath at rest rec Stop Labetalol .  Begin Bisoprolol '10mg'$  Twice daily   Follow med calendar closely and bring to each visit. Follow up Dr. Melvyn Novas  In 4 weeks and As needed       03/24/2014 f/u ov/Wert re: COPD  GOLD II with reversibility /still smoking esp p meals/coffee  using saba one puff before exertion / never neb/ using med calendar  Chief Complaint  Patient presents with  . Follow-up    Pt states that his breathing is unchanged since last visit. No new co's today.    only problem is steps cause sob now, not walking flat Never took bisoprol but says stopped labetolol (note bp and pulse still quite low)  rec Work on inhaler technique:    Symbiocort 160 Take 2 puffs first thing in am and  then another 2 puffs about 12 hours later.  Only use your albuterol (proair) as a rescue medication  Only use the nebulizer if you try the proair and it fails to work  The key is to stop smoking completely before smoking completely stops you!  Please schedule a follow up visit in 3 months but call sooner if needed  Late add consider stiolto or spiriva plus symb next ov if not happy    07/28/2014 f/u ov/Wert re: GOLD II/ still smoking /no med calendar  Chief Complaint  Patient presents with  . Follow-up    Pt states he is doing well with the Symbicort and his SOB has improved since starting med. Pt states he still does not have much energy. Pt c/o prod cough with white mucus. Pt denies CP/tightness.   MMRC 2  Can't walk nl pace even flat  Never need neb saba  Poor mdi / understanding of main vs prn  Cough mostly in ams Main concern is thinks bp meds too strong (catapres) and very fatigued/sluggish since starting it  >>decrease clonidine am dose   10/27/2014 Follow up : COPD GOLD II /smoker  Pt returns for 3 month follow up  Remains on Symbicort Twice daily  .  Feels his breathing is at baseline with no flare of cough or dyspnea.  Gets winded with steps.and long distance Exercises everyday for 37mn .  Denies chest pain, orthopnea, edema or fever.  Wants flu shot today .  We reviewed his meds and organized them into a med calendar with pt educaiton .  Appears to be taking correclty .  Clonidine was decreaased at last ov due to sluggishness but did not help, now back on full dose.   Current Medications, Allergies, Complete Past Medical History, Past Surgical History, Family History, and Social History [facility] [employer]  ROS  The following are not active complaints unless bolded sore throat, dysphagia, dental problems, itching, sneezing,  nasal congestion or excess/ purulent secretions, ear ache,   fever, chills, sweats, unintended wt loss,  pleuritic or exertional cp, hemoptysis,  orthopnea pnd or leg swelling, presyncope, palpitations, heartburn, abdominal pain, anorexia, nausea, vomiting, diarrhea  or change in bowel or urinary habits, change in stools or urine, dysuria,hematuria,  rash, arthralgias, visual complaints, headache, numbness weakness or ataxia or problems with walking or coordination,  change in mood/affect or memory.              Objective:   Physical Exam  11/05/2013      216  > 01/27/2014 214 , 213/ 02/24/2014 > 03/24/2014  212 > 07/28/14  211 >216 10/27/2014   amb   Nad  HEENT mild  turbinate edema.  Oropharynx no thrush or excess pnd or cobblestoning.  No JVD or cervical adenopathy. Mild accessory muscle hypertrophy. Trachea midline, nl thryroid. Chest was hyperinflated by percussion with diminished breath sounds and moderate increased exp time  Regular rate and rhythm without murmur gallop or rub or increase P2 or edema.  Abd: no hsm, nl excursion. Ext warm without cyanosis or clubbing.       CT s contrast 06/27/14 1. Stable pleural-based nodular density in the medial aspect of the lingula is strongly favored to be benign. Since a pleural based lesion has been present in this region on prior studies dating back to at least 09/24/2009, and this lesion has been stable over nearly a 1 year time interval, this is presumably an area of rounded atelectasis. 2. Status post right upper lobectomy. 3. Diffuse bronchial wall thickening with moderate to severe centrilobular and paraseptal emphysema with extensive bullous changes.            Assessment & Plan:

## 2014-10-27 NOTE — Progress Notes (Signed)
Chart and office note reviewed in detail  > agree with a/p as outlined    

## 2014-10-27 NOTE — Patient Instructions (Addendum)
Flu shot today    Follow med calendar closely and bring to each visit. Follow up Dr. Melvyn Novas  In 3 months  And As needed     Please contact office for sooner follow up if symptoms do not improve or worsen or seek emergency care

## 2014-10-28 NOTE — Addendum Note (Signed)
Addended by: Osa Craver on: 10/28/2014 01:51 PM   Modules accepted: Orders, Medications

## 2014-11-12 ENCOUNTER — Other Ambulatory Visit: Payer: Self-pay | Admitting: Internal Medicine

## 2014-11-12 MED ORDER — IRBESARTAN 150 MG PO TABS: 150.0000 mg | ORAL_TABLET | Freq: Every day | ORAL | Status: DC

## 2014-11-19 ENCOUNTER — Other Ambulatory Visit: Payer: Self-pay | Admitting: Internal Medicine

## 2015-01-09 ENCOUNTER — Encounter (HOSPITAL_COMMUNITY): Payer: Self-pay | Admitting: Emergency Medicine

## 2015-01-09 ENCOUNTER — Emergency Department (HOSPITAL_COMMUNITY)
Admission: EM | Admit: 2015-01-09 | Discharge: 2015-01-09 | Disposition: A | Discharge status: Home / Self Care | Payer: Medicare Other | Attending: Emergency Medicine | Admitting: Emergency Medicine

## 2015-01-09 ENCOUNTER — Emergency Department (HOSPITAL_COMMUNITY): Payer: Medicare Other

## 2015-01-09 DIAGNOSIS — I5030 Unspecified diastolic (congestive) heart failure: Secondary | ICD-10-CM | POA: Diagnosis not present

## 2015-01-09 DIAGNOSIS — F17210 Nicotine dependence, cigarettes, uncomplicated: Secondary | ICD-10-CM | POA: Diagnosis not present

## 2015-01-09 DIAGNOSIS — Z9889 Other specified postprocedural states: Secondary | ICD-10-CM | POA: Diagnosis not present

## 2015-01-09 DIAGNOSIS — Z87438 Personal history of other diseases of male genital organs: Secondary | ICD-10-CM | POA: Insufficient documentation

## 2015-01-09 DIAGNOSIS — Z7951 Long term (current) use of inhaled steroids: Secondary | ICD-10-CM | POA: Diagnosis not present

## 2015-01-09 DIAGNOSIS — Z79899 Other long term (current) drug therapy: Secondary | ICD-10-CM | POA: Diagnosis not present

## 2015-01-09 DIAGNOSIS — Z8673 Personal history of transient ischemic attack (TIA), and cerebral infarction without residual deficits: Secondary | ICD-10-CM | POA: Diagnosis not present

## 2015-01-09 DIAGNOSIS — M549 Dorsalgia, unspecified: Secondary | ICD-10-CM | POA: Diagnosis not present

## 2015-01-09 DIAGNOSIS — J449 Chronic obstructive pulmonary disease, unspecified: Secondary | ICD-10-CM | POA: Diagnosis not present

## 2015-01-09 DIAGNOSIS — Z7982 Long term (current) use of aspirin: Secondary | ICD-10-CM | POA: Insufficient documentation

## 2015-01-09 DIAGNOSIS — I10 Essential (primary) hypertension: Secondary | ICD-10-CM | POA: Diagnosis not present

## 2015-01-09 DIAGNOSIS — R109 Unspecified abdominal pain: Secondary | ICD-10-CM | POA: Diagnosis not present

## 2015-01-09 LAB — URINALYSIS, ROUTINE W REFLEX MICROSCOPIC
Glucose, UA: NEGATIVE mg/dL
Hgb urine dipstick: NEGATIVE
Ketones, ur: NEGATIVE mg/dL
Leukocytes, UA: NEGATIVE
Nitrite: NEGATIVE
Protein, ur: NEGATIVE mg/dL
Specific Gravity, Urine: 1.035 — ABNORMAL HIGH (ref 1.005–1.030)
pH: 6 (ref 5.0–8.0)

## 2015-01-09 LAB — COMPREHENSIVE METABOLIC PANEL
ALT: 13 U/L — ABNORMAL LOW (ref 17–63)
AST: 12 U/L — ABNORMAL LOW (ref 15–41)
Albumin: 3 g/dL — ABNORMAL LOW (ref 3.5–5.0)
Alkaline Phosphatase: 88 U/L (ref 38–126)
Anion gap: 9 (ref 5–15)
BUN: 13 mg/dL (ref 6–20)
CO2: 24 mmol/L (ref 22–32)
Calcium: 8.6 mg/dL — ABNORMAL LOW (ref 8.9–10.3)
Chloride: 104 mmol/L (ref 101–111)
Creatinine, Ser: 1.11 mg/dL (ref 0.61–1.24)
GFR calc Af Amer: >60 mL/min (ref >60)
GFR calc non Af Amer: >60 mL/min (ref >60)
Glucose, Bld: 145 mg/dL — ABNORMAL HIGH (ref 65–99)
Potassium: 3.3 mmol/L — ABNORMAL LOW (ref 3.5–5.1)
Sodium: 137 mmol/L (ref 135–145)
Total Bilirubin: 0.5 mg/dL (ref 0.3–1.2)
Total Protein: 6.1 g/dL — ABNORMAL LOW (ref 6.5–8.1)

## 2015-01-09 LAB — LIPASE, BLOOD: Lipase: 59 U/L — ABNORMAL HIGH (ref 11–51)

## 2015-01-09 LAB — CBC
HCT: 43.4 % (ref 39.0–52.0)
Hemoglobin: 14.6 g/dL (ref 13.0–17.0)
MCH: 29.4 pg (ref 26.0–34.0)
MCHC: 33.6 g/dL (ref 30.0–36.0)
MCV: 87.3 fL (ref 78.0–100.0)
Platelets: 221 10*3/uL (ref 150–400)
RBC: 4.97 MIL/uL (ref 4.22–5.81)
RDW: 14.8 % (ref 11.5–15.5)
WBC: 9.5 10*3/uL (ref 4.0–10.5)

## 2015-01-09 MED ORDER — IOHEXOL 300 MG/ML  SOLN
100.0000 mL | Freq: Once | INTRAMUSCULAR | Status: AC | PRN
  Administered 2015-01-09: 100 mL via INTRAVENOUS

## 2015-01-09 MED ORDER — SODIUM CHLORIDE 0.9 % IV BOLUS (SEPSIS): 1000.0000 mL | Freq: Once | INTRAVENOUS | Status: DC

## 2015-01-09 MED ORDER — SODIUM CHLORIDE 0.9 % IV BOLUS (SEPSIS)
500.0000 mL | Freq: Once | INTRAVENOUS | Status: AC
  Administered 2015-01-09: 500 mL via INTRAVENOUS

## 2015-01-09 MED ORDER — FENTANYL CITRATE (PF) 100 MCG/2ML IJ SOLN
50.0000 ug | Freq: Once | INTRAMUSCULAR | Status: AC
  Administered 2015-01-09: 50 ug via INTRAVENOUS
  Filled 2015-01-09: qty 2

## 2015-01-09 MED ORDER — ONDANSETRON HCL 4 MG/2ML IJ SOLN
4.0000 mg | Freq: Once | INTRAMUSCULAR | Status: AC
  Administered 2015-01-09: 4 mg via INTRAVENOUS
  Filled 2015-01-09: qty 2

## 2015-01-09 NOTE — ED Provider Notes (Signed)
CSN: [id number]     Arrival date & time 01/09/15  1830 History   First MD Initiated Contact with Patient 01/09/15 1920     Chief Complaint  Patient presents with  . Back Pain     (Consider location/radiation/quality/duration/timing/severity/associated sxs/prior Treatment) HPI Jonathon [name] is a 64 y.o. male with hx of htn, CHF, CVA, with right sided body weakness as a deficit, COPD, pneumothorax, presents to emergency department complaining of left-sided flank pain. Patient states that pain started 3 days ago. States pain is in the left upper abdomen and breast around to the left flank. He denies any worsening and shortness of breath. He denies any nausea or vomiting. He is eating well with good appetite. He states he is having urinary frequency, denies any dysuria, urgency, inability to urinate. He denies any fever or chills. He states pain is worse with taking a deep breath and movement. He denies any swelling in his extremities. He denies any injuries or strenuous activity. He has taken Aleve with no improvement.  Past Medical History  Diagnosis Date  . Hypertension   . Smoker   . History of CVA (cerebrovascular accident) 02-22-2011    LEFT BRAIN STEM PONTINE HEMORRHAGE--  RIGHT SIDED WEAKNESS  . History of CHF (congestive heart failure) MONITORED BY DR BOUSKA    DIASTOLIC  . Hydrocele, left   . Chronic cough   . Short of breath on exertion   . Harsh voice quality     PT STATES NORMAL FOR HIM  . COPD, severe ([employer])     PT DENIES REGULAR DAILY INHALER ANY PRN  . Pre-operative clearance     GIVEN BY DR Coletta Memos  . Weakness of right side of body     S/P CVA   FEB 2013   Past Surgical History  Procedure Laterality Date  . Thoracotomy/lobectomy Right 03-06-2008    AND STAPLING OF BLEBS FOR SPONTANOUS PNEUMOTHORAX  . Cardiac catheterization  05-02-2011  DR NISHAN    MILD NONOBSTRUCTIVE CAD/ LAD, OM , AND D1/ EF 60^  . Cardiac catheterization  AUG 2000    NON-OBSTRUTIVE CAD/  EF 68%/ 25% PROXIMAL LAD/ 20% MID CIRCUMFLEX  . Transthoracic echocardiogram  02-17-2011    MODERATE LVH/ LVSF NORMAL/ EF 78-46%/ GRADE I DIASTOLIC DYSFUNCTION  . Inguinal hernia repair Left   . Hydrocele excision Left 04/24/2012    Procedure: HYDROCELECTOMY ADULT;  Surgeon: Fredricka Bonine, MD;  Location: [facility];  Service: Urology;  Laterality: Left;  90 mins req for this case     . Left heart catheterization with coronary angiogram [name] 05/02/2011    Procedure: LEFT HEART CATHETERIZATION WITH CORONARY ANGIOGRAM;  Surgeon: Josue Hector, MD;  Location: [facility] CATH LAB;  Service: Cardiovascular;  Laterality: [name];  . Left and right heart catheterization with coronary angiogram [name] 09/04/2012    Procedure: LEFT AND RIGHT HEART CATHETERIZATION WITH CORONARY ANGIOGRAM;  Surgeon: Laverda Page, MD;  Location: [facility] CATH LAB;  Service: Cardiovascular;  Laterality: [name];  . Abdominal angiogram  09/04/2012    Procedure: ABDOMINAL ANGIOGRAM;  Surgeon: Laverda Page, MD;  Location: [facility] CATH LAB;  Service: Cardiovascular;;   Family History  Problem Relation Age of Onset  . Hypertension Mother   . Lung disease Father     died of "colapsed lung" per patient  . Asthma Father   . Colon cancer Neg Hx   . Stomach cancer Neg Hx    Social History  Substance Use Topics  .  Smoking status: Current Every Day Smoker -- 1.00 packs/day for 48 years    Types: Cigarettes  . Smokeless tobacco: Never Used     Comment: 1/2 ppd 01/27/14  . Alcohol Use: 0.0 oz/week    0 Standard drinks or equivalent per week     Comment: OCCASIONAL--      Review of Systems  Constitutional: Negative for fever and chills.  Respiratory: Negative for cough, chest tightness and shortness of breath.   Cardiovascular: Negative for chest pain, palpitations and leg swelling.  Gastrointestinal: Positive for abdominal pain. Negative for nausea, vomiting, diarrhea and abdominal distention.  Genitourinary: Positive  for frequency and flank pain. Negative for dysuria, urgency and hematuria.  Musculoskeletal: Positive for back pain. Negative for myalgias, arthralgias, neck pain and neck stiffness.  Skin: Negative for rash.  Allergic/Immunologic: Negative for immunocompromised state.  Neurological: Negative for dizziness, weakness, light-headedness, numbness and headaches.  All other systems reviewed and are negative.     Allergies  Review of patient's allergies indicates no known allergies.  Home Medications   Prior to Admission medications   Medication Sig Start Date End Date Taking? Authorizing Provider  albuterol (PROVENTIL) (2.5 MG/3ML) 0.083% nebulizer solution Take 2.5 mg by nebulization every 4 (four) hours as needed for wheezing or shortness of breath.   Yes Historical Provider, MD  Albuterol Sulfate (PROAIR RESPICLICK) 409 (90 BASE) MCG/ACT AEPB Inhale 2 puffs into the lungs 4 (four) times daily as needed. 02/24/14  Yes Tammy S Parrett, NP  amLODipine (NORVASC) 10 MG tablet Take 10 mg by mouth every morning.    Yes Historical Provider, MD  aspirin EC 325 MG tablet Take 325 mg by mouth every morning.    Yes Historical Provider, MD  atorvastatin (LIPITOR) 10 MG tablet Take 1/2 tablet by mouth every morning   Yes Historical Provider, MD  budesonide-formoterol (SYMBICORT) 160-4.5 MCG/ACT inhaler Inhale 2 puffs into the lungs 2 (two) times daily.   Yes Historical Provider, MD  cloNIDine (CATAPRES) 0.3 MG tablet Take 0.3 mg by mouth 2 (two) times daily.   Yes Historical Provider, MD  Cyanocobalamin (VITAMIN B 12 PO) Take 1 tablet by mouth every morning.    Yes Historical Provider, MD  cyclobenzaprine (FLEXERIL) 5 MG tablet Take 5 mg by mouth at bedtime.  02/03/14  Yes Historical Provider, MD  DULoxetine (CYMBALTA) 30 MG capsule Take 30 mg by mouth every morning.  02/03/14  Yes Historical Provider, MD  gabapentin (NEURONTIN) 400 MG capsule Take 400 mg by mouth 3 (three) times daily.    Yes Historical  Provider, MD  ibuprofen (ADVIL,MOTRIN) 200 MG tablet Take 400 mg by mouth every 6 (six) hours as needed for moderate pain.   Yes Historical Provider, MD  irbesartan (AVAPRO) 150 MG tablet Take 1 tablet (150 mg total) by mouth daily. 11/12/14  Yes Tanda Rockers, MD  metFORMIN (GLUCOPHAGE) 500 MG tablet Take 500 mg by mouth daily with breakfast.   Yes Historical Provider, MD  nitroGLYCERIN (NITROSTAT) 0.4 MG SL tablet Place 0.4 mg under the tongue every 5 (five) minutes as needed for chest pain.   Yes Historical Provider, MD  nortriptyline (PAMELOR) 25 MG capsule Take 25 mg by mouth at bedtime.   Yes Historical Provider, MD  omeprazole (PRILOSEC) 20 MG capsule Take 20 mg by mouth 2 (two) times daily before a meal.   Yes Historical Provider, MD  Psyllium ([employer] NATURAL DAILY FIBER PO) Take 1 tablet by mouth every morning.   Yes Historical Provider,  MD   BP 139/97 mmHg  Pulse 74  Temp(Src) 98.6 F (37 C) (Oral)  Resp 17  Ht 6' (1.829 m)  Wt 97.523 kg  BMI 29.15 kg/m2  SpO2 96% Physical Exam  Constitutional: He is oriented to person, place, and time. He appears well-developed and well-nourished. No distress.  HENT:  Head: Normocephalic and atraumatic.  Eyes: Conjunctivae are normal.  Neck: Neck supple.  Cardiovascular: Normal rate, regular rhythm and normal heart sounds.   Pulmonary/Chest: Effort normal. No respiratory distress. He has no wheezes. He has no rales.  Decreased air movement L left lung base.  Abdominal: Soft. Bowel sounds are normal. He exhibits no distension. There is no tenderness. There is no rebound and no guarding.  No tenderness to the abdomen. Mild left CVA tenderness. No rashes over her abdomen or flank noted.  Musculoskeletal: He exhibits no edema.  Neurological: He is alert and oriented to person, place, and time.  Skin: Skin is warm and dry.  Nursing note and vitals reviewed.   ED Course  Procedures (including critical care time) Labs Review Labs Reviewed   LIPASE, BLOOD - Abnormal; Notable for the following:    Lipase 59 (*)    All other components within normal limits  COMPREHENSIVE METABOLIC PANEL - Abnormal; Notable for the following:    Potassium 3.3 (*)    Glucose, Bld 145 (*)    Calcium 8.6 (*)    Total Protein 6.1 (*)    Albumin 3.0 (*)    AST 12 (*)    ALT 13 (*)    All other components within normal limits  URINALYSIS, ROUTINE W REFLEX MICROSCOPIC (NOT AT [facility]) - Abnormal; Notable for the following:    Color, Urine AMBER (*)    Specific Gravity, Urine 1.035 (*)    Bilirubin Urine SMALL (*)    All other components within normal limits  CBC    Imaging Review Dg Chest 2 View  01/09/2015  CLINICAL DATA:  Left flank pain EXAM: CHEST  2 VIEW COMPARISON:  March 27, 2014 FINDINGS: The heart size and mediastinal contours are stable. The aorta is tortuous. The lungs are hyperinflated. Bullous change of the left lung base is noted. Both lungs are clear. The visualized skeletal structures are stable. IMPRESSION: No active cardiopulmonary disease.  Emphysema. Electronically Signed   By: Abelardo Diesel [name]   On: 01/09/2015 20:09   Ct Abdomen Pelvis W Contrast  01/09/2015  CLINICAL DATA:  Three days of mid back pain radiating to the left abdomen. EXAM: CT ABDOMEN AND PELVIS WITH CONTRAST TECHNIQUE: Multidetector CT imaging of the abdomen and pelvis was performed using the standard protocol following bolus administration of intravenous contrast. CONTRAST:  156m OMNIPAQUE IOHEXOL 300 MG/ML  SOLN COMPARISON:  August 24, 2012 FINDINGS: The liver, spleen, pancreas, adrenal glands are normal. There is a gallstone in the gallbladder. There is a 3 mm nonobstructing stone in the midpole right kidney. There is scarring of the right kidney. There is a 2 cm cyst in the upper pole left kidney. There is no hydronephrosis bilaterally. There is atherosclerosis of the abdominal aorta without aneurysmal dilatation. There is no abdominal lymphadenopathy. There is  no small bowel obstruction or diverticulitis. The appendix is normal. Fluid-filled bladder is normal. There is no pelvic lymphadenopathy. Bullous changes of the lung bases are identified. Degenerative joint changes of the spine are noted. IMPRESSION: No acute abnormality identified in the abdomen and pelvis. Cholelithiasis. Nephrolithiasis in right kidney without hydronephrosis bilaterally. Electronically  Signed   By: Abelardo Diesel [name]   On: 01/09/2015 21:44   I have personally reviewed and evaluated these images and lab results as part of my medical decision-making.   EKG Interpretation None      MDM   Final diagnoses:  Flank pain    Pt with left flank pain, worsened with movement. No abdominal tenderness. Will get labs, UA, chest xray. Pain medications ordered.   pts labs showed no significant findings to explain pt's pain. CXR showing bollous emphysema. Will get CT abd/pelvis.   CT negative. Discussed with Dr. Eulis Foster who has seen pt as well, most likely musculoskeletal pain. Will treat with tylenol, heating pads, follow up with pcp.   Filed Vitals:   01/09/15 2200 01/09/15 2215 01/09/15 2230 01/09/15 2321  BP: 144/87  144/76 126/74  Pulse: 75 73 69 76  Temp:      TempSrc:      Resp:    16  Height:      Weight:      SpO2: 90% 90% 91% 99%       Jeannett Senior, PA-C 01/10/15 0037  Daleen Bo, MD 01/14/15 [phone]

## 2015-01-09 NOTE — ED Provider Notes (Signed)
  Face-to-face evaluation   History: He complains of left flank pain for 2 days after lifting a heavy sign. He denies dysuria, frequency, or change in bowel habits. No fever or chills.  Physical exam: Alert, obese man who is comfortable. Abdomen is soft, and is nontender to palpation.  Medical screening examination/treatment/procedure(s) were conducted as a shared visit with non-physician practitioner(s) and myself.  I personally evaluated the patient during the encounter  Daleen Bo, MD 01/14/15 [phone]

## 2015-01-09 NOTE — Discharge Instructions (Signed)
Take tylenol for pain. Apply heat and rest. Follow up with primary care doctor if not improving.    Flank Pain Flank pain refers to pain that is located on the side of the body between the upper abdomen and the back. The pain may occur over a short period of time (acute) or may be long-term or reoccurring (chronic). It may be mild or severe. Flank pain can be caused by many things. CAUSES  Some of the more common causes of flank pain include:  Muscle strains.   Muscle spasms.   A disease of your spine (vertebral disk disease).   A lung infection (pneumonia).   Fluid around your lungs (pulmonary edema).   A kidney infection.   Kidney stones.   A very painful skin rash caused by the chickenpox virus (shingles).   Gallbladder disease.  Hadar care will depend on the cause of your pain. In general,  Rest as directed by your caregiver.  Drink enough fluids to keep your urine clear or pale yellow.  Only take over-the-counter or prescription medicines as directed by your caregiver. Some medicines may help relieve the pain.  Tell your caregiver about any changes in your pain.  Follow up with your caregiver as directed. SEEK IMMEDIATE MEDICAL CARE IF:   Your pain is not controlled with medicine.   You have new or worsening symptoms.  Your pain increases.   You have abdominal pain.   You have shortness of breath.   You have persistent nausea or vomiting.   You have swelling in your abdomen.   You feel faint or pass out.   You have blood in your urine.  You have a fever or persistent symptoms for more than 2-3 days.  You have a fever and your symptoms suddenly get worse. MAKE SURE YOU:   Understand these instructions.  Will watch your condition.  Will get help right away if you are not doing well or get worse.   This information is not intended to replace advice given to you by your health care provider. Make sure you  discuss any questions you have with your health care provider.   Document Released: 02/24/2005 Document Revised: 09/28/2011 Document Reviewed: 08/18/2011 Elsevier Interactive Patient Education [employer].

## 2015-01-09 NOTE — ED Notes (Signed)
Pt states for three days he has had left sided mid back pain that last night started radiating into left side of abdomen. Pt denies any n/v/d or urinary symptoms.

## 2015-01-27 ENCOUNTER — Ambulatory Visit: Payer: Medicare HMO | Admitting: Internal Medicine

## 2015-02-09 ENCOUNTER — Other Ambulatory Visit: Payer: Self-pay | Admitting: Internal Medicine

## 2015-03-16 ENCOUNTER — Other Ambulatory Visit: Payer: Self-pay | Admitting: Internal Medicine

## 2015-04-09 ENCOUNTER — Other Ambulatory Visit: Payer: Self-pay | Admitting: Internal Medicine

## 2015-05-06 ENCOUNTER — Other Ambulatory Visit: Payer: Self-pay | Admitting: Internal Medicine

## 2015-09-08 ENCOUNTER — Other Ambulatory Visit: Payer: Self-pay | Admitting: Internal Medicine

## 2015-12-18 DIAGNOSIS — I214 Non-ST elevation (NSTEMI) myocardial infarction: Secondary | ICD-10-CM

## 2015-12-18 HISTORY — DX: Non-ST elevation (NSTEMI) myocardial infarction: I21.4

## 2015-12-22 ENCOUNTER — Inpatient Hospital Stay (HOSPITAL_COMMUNITY)
Admission: EM | Admit: 2015-12-22 | Discharge: 2015-12-25 | DRG: 247 | Disposition: A | Discharge status: Home / Self Care | Payer: Medicare Other | Attending: Internal Medicine | Admitting: Internal Medicine

## 2015-12-22 ENCOUNTER — Emergency Department (HOSPITAL_COMMUNITY): Payer: Medicare Other

## 2015-12-22 ENCOUNTER — Encounter (HOSPITAL_COMMUNITY): Payer: Self-pay

## 2015-12-22 DIAGNOSIS — Z955 Presence of coronary angioplasty implant and graft: Secondary | ICD-10-CM

## 2015-12-22 DIAGNOSIS — I10 Essential (primary) hypertension: Secondary | ICD-10-CM | POA: Diagnosis present

## 2015-12-22 DIAGNOSIS — I214 Non-ST elevation (NSTEMI) myocardial infarction: Secondary | ICD-10-CM | POA: Diagnosis not present

## 2015-12-22 DIAGNOSIS — R0789 Other chest pain: Secondary | ICD-10-CM | POA: Diagnosis present

## 2015-12-22 DIAGNOSIS — Z9981 Dependence on supplemental oxygen: Secondary | ICD-10-CM

## 2015-12-22 DIAGNOSIS — I509 Heart failure, unspecified: Secondary | ICD-10-CM | POA: Diagnosis present

## 2015-12-22 DIAGNOSIS — I152 Hypertension secondary to endocrine disorders: Secondary | ICD-10-CM | POA: Diagnosis present

## 2015-12-22 DIAGNOSIS — E876 Hypokalemia: Secondary | ICD-10-CM | POA: Diagnosis present

## 2015-12-22 DIAGNOSIS — J439 Emphysema, unspecified: Secondary | ICD-10-CM | POA: Diagnosis present

## 2015-12-22 DIAGNOSIS — Z7982 Long term (current) use of aspirin: Secondary | ICD-10-CM

## 2015-12-22 DIAGNOSIS — J449 Chronic obstructive pulmonary disease, unspecified: Secondary | ICD-10-CM | POA: Diagnosis not present

## 2015-12-22 DIAGNOSIS — I619 Nontraumatic intracerebral hemorrhage, unspecified: Secondary | ICD-10-CM | POA: Diagnosis not present

## 2015-12-22 DIAGNOSIS — E785 Hyperlipidemia, unspecified: Secondary | ICD-10-CM | POA: Diagnosis present

## 2015-12-22 DIAGNOSIS — I2510 Atherosclerotic heart disease of native coronary artery without angina pectoris: Secondary | ICD-10-CM | POA: Diagnosis present

## 2015-12-22 DIAGNOSIS — R079 Chest pain, unspecified: Secondary | ICD-10-CM

## 2015-12-22 DIAGNOSIS — E119 Type 2 diabetes mellitus without complications: Secondary | ICD-10-CM

## 2015-12-22 DIAGNOSIS — Z7984 Long term (current) use of oral hypoglycemic drugs: Secondary | ICD-10-CM

## 2015-12-22 DIAGNOSIS — Z8673 Personal history of transient ischemic attack (TIA), and cerebral infarction without residual deficits: Secondary | ICD-10-CM

## 2015-12-22 DIAGNOSIS — I110 Hypertensive heart disease with heart failure: Secondary | ICD-10-CM | POA: Diagnosis present

## 2015-12-22 DIAGNOSIS — E1159 Type 2 diabetes mellitus with other circulatory complications: Secondary | ICD-10-CM | POA: Diagnosis present

## 2015-12-22 DIAGNOSIS — R001 Bradycardia, unspecified: Secondary | ICD-10-CM | POA: Diagnosis present

## 2015-12-22 HISTORY — DX: Atherosclerotic heart disease of native coronary artery without angina pectoris: I25.10

## 2015-12-22 HISTORY — DX: Dyspnea, unspecified: R06.00

## 2015-12-22 HISTORY — DX: Gastro-esophageal reflux disease without esophagitis: K21.9

## 2015-12-22 HISTORY — DX: Type 2 diabetes mellitus without complications: E11.9

## 2015-12-22 HISTORY — DX: Non-ST elevation (NSTEMI) myocardial infarction: I21.4

## 2015-12-22 LAB — I-STAT TROPONIN, ED: Troponin i, poc: 0.01 ng/mL (ref 0.00–0.08)

## 2015-12-22 LAB — BASIC METABOLIC PANEL
Anion gap: 13 (ref 5–15)
BUN: 10 mg/dL (ref 6–20)
CO2: 21 mmol/L — ABNORMAL LOW (ref 22–32)
Calcium: 8.7 mg/dL — ABNORMAL LOW (ref 8.9–10.3)
Chloride: 102 mmol/L (ref 101–111)
Creatinine, Ser: 1.24 mg/dL (ref 0.61–1.24)
GFR calc Af Amer: >60 mL/min (ref >60)
GFR calc non Af Amer: 59 mL/min — ABNORMAL LOW (ref >60)
Glucose, Bld: 125 mg/dL — ABNORMAL HIGH (ref 65–99)
Potassium: 3.4 mmol/L — ABNORMAL LOW (ref 3.5–5.1)
Sodium: 136 mmol/L (ref 135–145)

## 2015-12-22 LAB — BRAIN NATRIURETIC PEPTIDE: B Natriuretic Peptide: 16.8 pg/mL (ref 0.0–100.0)

## 2015-12-22 LAB — CBC
HCT: 44 % (ref 39.0–52.0)
Hemoglobin: 14.8 g/dL (ref 13.0–17.0)
MCH: 28.4 pg (ref 26.0–34.0)
MCHC: 33.6 g/dL (ref 30.0–36.0)
MCV: 84.5 fL (ref 78.0–100.0)
Platelets: 254 10*3/uL (ref 150–400)
RBC: 5.21 MIL/uL (ref 4.22–5.81)
RDW: 15.2 % (ref 11.5–15.5)
WBC: 13.4 10*3/uL — ABNORMAL HIGH (ref 4.0–10.5)

## 2015-12-22 LAB — TROPONIN I: Troponin I: 2.89 ng/mL (ref <0.03)

## 2015-12-22 MED ORDER — MOMETASONE FURO-FORMOTEROL FUM 200-5 MCG/ACT IN AERO
2.0000 | INHALATION_SPRAY | Freq: Two times a day (BID) | RESPIRATORY_TRACT | Status: DC
  Administered 2015-12-23 – 2015-12-25 (×5): 2 via RESPIRATORY_TRACT
  Filled 2015-12-22 (×2): qty 8.8

## 2015-12-22 MED ORDER — DULOXETINE HCL 30 MG PO CPEP
30.0000 mg | ORAL_CAPSULE | Freq: Every day | ORAL | Status: DC
  Administered 2015-12-23 – 2015-12-25 (×3): 30 mg via ORAL
  Filled 2015-12-22 (×3): qty 1

## 2015-12-22 MED ORDER — ONDANSETRON HCL 4 MG/2ML IJ SOLN
4.0000 mg | Freq: Once | INTRAMUSCULAR | Status: AC
  Administered 2015-12-22: 4 mg via INTRAVENOUS
  Filled 2015-12-22: qty 2

## 2015-12-22 MED ORDER — AMLODIPINE BESYLATE 10 MG PO TABS
10.0000 mg | ORAL_TABLET | Freq: Every day | ORAL | Status: DC
  Administered 2015-12-23 – 2015-12-25 (×3): 10 mg via ORAL
  Filled 2015-12-22 (×3): qty 1

## 2015-12-22 MED ORDER — NORTRIPTYLINE HCL 25 MG PO CAPS
25.0000 mg | ORAL_CAPSULE | Freq: Every day | ORAL | Status: DC
  Administered 2015-12-22 – 2015-12-24 (×3): 25 mg via ORAL
  Filled 2015-12-22 (×5): qty 1

## 2015-12-22 MED ORDER — ATORVASTATIN CALCIUM 10 MG PO TABS
5.0000 mg | ORAL_TABLET | Freq: Every day | ORAL | Status: DC
  Administered 2015-12-23 – 2015-12-25 (×3): 5 mg via ORAL
  Filled 2015-12-22 (×3): qty 1

## 2015-12-22 MED ORDER — PANTOPRAZOLE SODIUM 40 MG PO TBEC
40.0000 mg | DELAYED_RELEASE_TABLET | Freq: Two times a day (BID) | ORAL | Status: DC
  Administered 2015-12-23 – 2015-12-25 (×5): 40 mg via ORAL
  Filled 2015-12-22 (×5): qty 1

## 2015-12-22 MED ORDER — GI COCKTAIL ~~LOC~~: 30.0000 mL | Freq: Four times a day (QID) | ORAL | Status: DC | PRN

## 2015-12-22 MED ORDER — ENOXAPARIN SODIUM 40 MG/0.4ML ~~LOC~~ SOLN
40.0000 mg | SUBCUTANEOUS | Status: DC
  Administered 2015-12-22: 40 mg via SUBCUTANEOUS
  Filled 2015-12-22: qty 0.4

## 2015-12-22 MED ORDER — CYCLOBENZAPRINE HCL 10 MG PO TABS
5.0000 mg | ORAL_TABLET | Freq: Every day | ORAL | Status: DC
  Administered 2015-12-22 – 2015-12-24 (×3): 5 mg via ORAL
  Filled 2015-12-22 (×3): qty 1

## 2015-12-22 MED ORDER — ONDANSETRON HCL 4 MG/2ML IJ SOLN: 4.0000 mg | Freq: Four times a day (QID) | INTRAMUSCULAR | Status: DC | PRN

## 2015-12-22 MED ORDER — INSULIN ASPART 100 UNIT/ML ~~LOC~~ SOLN
0.0000 [IU] | Freq: Four times a day (QID) | SUBCUTANEOUS | Status: DC
  Administered 2015-12-24: 1 [IU] via SUBCUTANEOUS
  Administered 2015-12-24 – 2015-12-25 (×2): 2 [IU] via SUBCUTANEOUS

## 2015-12-22 MED ORDER — SODIUM CHLORIDE 0.9 % IV BOLUS (SEPSIS)
500.0000 mL | Freq: Once | INTRAVENOUS | Status: AC
  Administered 2015-12-22: 500 mL via INTRAVENOUS

## 2015-12-22 MED ORDER — DULOXETINE HCL 30 MG PO CPEP: 30.0000 mg | ORAL_CAPSULE | ORAL | Status: DC

## 2015-12-22 MED ORDER — GABAPENTIN 400 MG PO CAPS
400.0000 mg | ORAL_CAPSULE | Freq: Three times a day (TID) | ORAL | Status: DC
Start: 2015-12-22 — End: 2015-12-25
  Administered 2015-12-22 – 2015-12-25 (×8): 400 mg via ORAL
  Filled 2015-12-22 (×8): qty 1

## 2015-12-22 MED ORDER — MORPHINE SULFATE (PF) 2 MG/ML IV SOLN: 2.0000 mg | INTRAVENOUS | Status: DC | PRN

## 2015-12-22 MED ORDER — ACETAMINOPHEN 325 MG PO TABS: 650.0000 mg | ORAL_TABLET | ORAL | Status: DC | PRN

## 2015-12-22 MED ORDER — CLONIDINE HCL 0.1 MG PO TABS
0.3000 mg | ORAL_TABLET | Freq: Two times a day (BID) | ORAL | Status: DC
  Administered 2015-12-22 – 2015-12-25 (×6): 0.3 mg via ORAL
  Filled 2015-12-22: qty 1
  Filled 2015-12-22 (×2): qty 3
  Filled 2015-12-22 (×3): qty 1

## 2015-12-22 MED ORDER — MORPHINE SULFATE (PF) 4 MG/ML IV SOLN
4.0000 mg | Freq: Once | INTRAVENOUS | Status: AC
  Administered 2015-12-22: 4 mg via INTRAVENOUS
  Filled 2015-12-22: qty 1

## 2015-12-22 MED ORDER — ASPIRIN EC 325 MG PO TBEC
325.0000 mg | DELAYED_RELEASE_TABLET | Freq: Every day | ORAL | Status: DC
  Administered 2015-12-23: 325 mg via ORAL
  Filled 2015-12-22: qty 1

## 2015-12-22 NOTE — ED Notes (Signed)
Report attempted 

## 2015-12-22 NOTE — H&P (Signed)
History and Physical  Patient Name: Jonathon [name]     BDZ:[id number]    DOB: [birth year]    DOA: 12/22/2015 PCP: Phineas Inches, MD   Patient coming from: Home     Chief Complaint: Chest pain  HPI: Jonathon [name] is a 65 y.o. male with a past medical history significant for history of stroke, active smoking, COPD not on home O2, NIDDM, and HTN who presents with chest pain.  The patient was in his usual state of health until this afternoon at [place], he was getting out of the showed when he had onset of dull, central chest pressure, all across his chest "like someone was sitting on it", associated with fatigue and SOB, non-radiating, without nausea, diaphoresis.  This was severe, 9/10, and unlike any pain he has had in the past, and so he had his wife call EMS.  EMS administered 325 mg aspirin and nitro en route without improvement in pain.  ED course: -Afebrile, heart rate 90-100, respirations and pulse ox normal, BP 145/76 -Initial ECG showed old LAFB without changes and troponin was negative. -Na 136, K 3.4, Cr 1.24 (baseline 1.1), WBC 13.4, Hgb 14.8 -CXR showed emphysema without pneumonia -TRH was asked to admit for observation, serial troponins and risk stratification.   He had a LHC in 2015 that showed no significant disease.  He is an active smoker. He has a mother with stroke, no other vascular disease in the family.    He has had no other cough, or chest congestion this week.  The pain today was not positional or associated with food.  He had no hemoptysis, leg swelling, recent surgery.     Review of Systems:  Review of Systems  Constitutional: Positive for malaise/fatigue.  Respiratory: Positive for shortness of breath.   Cardiovascular: Positive for chest pain.  All other systems reviewed and are negative.    Past Medical History:  Diagnosis Date  . Chronic cough   . COPD, severe ([place])    PT DENIES REGULAR DAILY INHALER ANY PRN  . Harsh voice quality    PT STATES NORMAL FOR HIM  . History of CHF (congestive heart failure) MONITORED BY DR BOUSKA   DIASTOLIC  . History of CVA (cerebrovascular accident) 02-22-2011   LEFT BRAIN STEM PONTINE HEMORRHAGE--  RIGHT SIDED WEAKNESS  . Hydrocele, left   . Hypertension   . Pre-operative clearance    GIVEN BY DR Coletta Memos  . Short of breath on exertion   . Smoker   . Weakness of right side of body    S/P CVA   FEB 2013    Past Surgical History:  Procedure Laterality Date  . ABDOMINAL ANGIOGRAM  09/04/2012   Procedure: ABDOMINAL ANGIOGRAM;  Surgeon: Laverda Page, MD;  Location: [facility] CATH LAB;  Service: Cardiovascular;;  . CARDIAC CATHETERIZATION  05-02-2011  DR Johnsie Cancel   MILD NONOBSTRUCTIVE CAD/ LAD, OM , AND D1/ EF 60^  . CARDIAC CATHETERIZATION  AUG 2000   NON-OBSTRUTIVE CAD/ EF 68%/ 25% PROXIMAL LAD/ 20% MID CIRCUMFLEX  . HYDROCELE EXCISION Left 04/24/2012   Procedure: HYDROCELECTOMY ADULT;  Surgeon: Fredricka Bonine, MD;  Location: [facility];  Service: Urology;  Laterality: Left;  90 mins req for this case     . INGUINAL HERNIA REPAIR Left   . LEFT AND RIGHT HEART CATHETERIZATION WITH CORONARY ANGIOGRAM [name] 09/04/2012   Procedure: LEFT AND RIGHT HEART CATHETERIZATION WITH CORONARY ANGIOGRAM;  Surgeon: Laverda Page, MD;  Location: [facility]  CATH LAB;  Service: Cardiovascular;  Laterality: [name];  . LEFT HEART CATHETERIZATION WITH CORONARY ANGIOGRAM [name] 05/02/2011   Procedure: LEFT HEART CATHETERIZATION WITH CORONARY ANGIOGRAM;  Surgeon: Josue Hector, MD;  Location: [facility] CATH LAB;  Service: Cardiovascular;  Laterality: [name];  . THORACOTOMY/LOBECTOMY Right 03-06-2008   AND STAPLING OF BLEBS FOR SPONTANOUS PNEUMOTHORAX  . TRANSTHORACIC ECHOCARDIOGRAM  02-17-2011   MODERATE LVH/ LVSF NORMAL/ EF 40-81%/ GRADE I DIASTOLIC DYSFUNCTION    Social History: Patient lives with his wife.  Patient walks unassisted.  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  Ongoing smoker.    No Known  Allergies  Family history: family history includes Asthma in his father; Hypertension in his mother; Lung disease in his father; Stroke in his mother.  Prior to Admission medications   Medication Sig Start Date End Date Taking? Authorizing Provider  albuterol (PROVENTIL) (2.5 MG/3ML) 0.083% nebulizer solution Take 2.5 mg by nebulization every 4 (four) hours as needed for wheezing or shortness of breath.    Historical Provider, MD  Albuterol Sulfate (PROAIR RESPICLICK) 448 (90 BASE) MCG/ACT AEPB Inhale 2 puffs into the lungs 4 (four) times daily as needed. 02/24/14   Tammy S Parrett, NP  amLODipine (NORVASC) 10 MG tablet Take 10 mg by mouth every morning.     Historical Provider, MD  aspirin EC 325 MG tablet Take 325 mg by mouth every morning.     Historical Provider, MD  atorvastatin (LIPITOR) 10 MG tablet Take 1/2 tablet by mouth every morning    Historical Provider, MD  budesonide-formoterol (SYMBICORT) 160-4.5 MCG/ACT inhaler Inhale 2 puffs into the lungs 2 (two) times daily.    Historical Provider, MD  cloNIDine (CATAPRES) 0.3 MG tablet Take 0.3 mg by mouth 2 (two) times daily.    Historical Provider, MD  Cyanocobalamin (VITAMIN B 12 PO) Take 1 tablet by mouth every morning.     Historical Provider, MD  cyclobenzaprine (FLEXERIL) 5 MG tablet Take 5 mg by mouth at bedtime.  02/03/14   Historical Provider, MD  DULoxetine (CYMBALTA) 30 MG capsule Take 30 mg by mouth every morning.  02/03/14   Historical Provider, MD  gabapentin (NEURONTIN) 400 MG capsule Take 400 mg by mouth 3 (three) times daily.     Historical Provider, MD  ibuprofen (ADVIL,MOTRIN) 200 MG tablet Take 400 mg by mouth every 6 (six) hours as needed for moderate pain.    Historical Provider, MD  irbesartan (AVAPRO) 150 MG tablet TAKE ONE (1) TABLET BY MOUTH EVERY DAY 04/09/15   Tanda Rockers, MD  metFORMIN (GLUCOPHAGE) 500 MG tablet Take 500 mg by mouth daily with breakfast.    Historical Provider, MD  nitroGLYCERIN (NITROSTAT) 0.4 MG  SL tablet Place 0.4 mg under the tongue every 5 (five) minutes as needed for chest pain.    Historical Provider, MD  nortriptyline (PAMELOR) 25 MG capsule Take 25 mg by mouth at bedtime.    Historical Provider, MD  omeprazole (PRILOSEC) 20 MG capsule Take 20 mg by mouth 2 (two) times daily before a meal.    Historical Provider, MD  Psyllium ([employer] NATURAL DAILY FIBER PO) Take 1 tablet by mouth every morning.    Historical Provider, MD       Physical Exam: BP 139/73   Pulse 85   Temp 98.1 F (36.7 C) (Oral)   Resp 15   Ht 6' (1.829 m)   SpO2 94%  General appearance: Well-developed, adult male, alert and in no acute distress, mild discomfort  from pain, appears tired.   Eyes: Anicteric, conjunctiva pink, lids and lashes normal.     ENT: No nasal deformity, discharge, or epistaxis.  OP moist without lesions.   Skin: Warm and dry.   Cardiac: RRR, nl S1-S2, no murmurs appreciated.  Capillary refill is brisk.  JVP normal.  No LE edema.  Radial and DP pulses 2+ and symmetric.  No carotid bruits. Respiratory: Normal respiratory rate and rhythm.  CTAB without rales or wheezes. GI: Abdomen soft without rigidity.  No TTP. No ascites.  Abdomen large, at baseline.   MSK: No deformities or effusions.   Pain not reproduced with palpation of precordium.  No pain with arm movement. Neuro: Sensorium intact and responding to questions, attention normal.  Speech is fluent.  Moves all extremities equally and with normal coordination.   Sensation diminished chronically on right side, stable. Psych: Behavior appropriate.  Affect normal.  No evidence of aural or visual hallucinations or delusions.       Labs on Admission:  The metabolic panel shows normal electrolyets and renal function. The complete blood count shows mild leukocytosis, no anemia, or thrombocytopenia. The initial troponin is negative.  Radiological Exams on Admission: Personally reviewed: Dg Chest 2 View  Result Date: 12/22/2015 CLINICAL  DATA:  65 y/o  M; chest pain. EXAM: CHEST  2 VIEW COMPARISON:  01/09/2015 chest radiograph FINDINGS: Stable cardiac silhouette within normal limits given projection and technique. Emphysema and left mid lung zone bullous changes are stable. No focal consolidation. No pleural effusion. Right hilar surgical clips. No acute osseous abnormality identified IMPRESSION: No active cardiopulmonary disease.  Emphysema with bullous changes. Electronically Signed   By: Kristine Garbe [name]   On: 12/22/2015 19:11    EKG: Independently reviewed. Rate 85, QTc 482, LAFB, no change from previous.  Echocardiogram 2013: EF 55% Grade I DD  LHC 2015: Reportedly negative per Dr. [name]        Assessment/Plan  1. Chest pain: This is new.  The patient has HEART score of 6. Pain is non-exertional, but dull and central and non-radiating.  Other potential causes of chest pain (PE, dissection, pancreatitis, pneumonia/effusion, pericarditis) are doubted.  We have been asked to admit the patient for observation and etiology consultation with Cardiology tomorrow.  -Serial troponins are ordered -Telemetry -Consult to cardiology, appreciate recommendations -Smoking cessation was recommended, nursing teaching ordered -Start nicotine patch tomorrow if troponins/Stress negative   2. COPD:  Clinically inactive -Continue home Symbicort as formulary alternative  3. HTN and stroke secondary prevention:  -Continue amlodipine, clonidine -Hold PRN irbesartan -Continue aspirin and statin  4. NIDDM:  -Hold metformin -SSI low dose every 6 hours while NPO  5. Other medications:  -Continue gabapentin and cyclobenzaprine -Continue Duloxetine and nortriptyline -Continue PPI      DVT prophylaxis: Lovenox Diet: NPO after 4am for anticipated stress testing Code Status: FULL  Family Communication: Wife at bedside  Disposition Plan: Anticipate overnight observation for arrhythmia on telemetry, serial  troponins and subsequent risk stratification by Cardiology.  If testing negative, home after. Consults called: Cardiology, Dr. [name] Admission status: Telemetry, OBS   Medical decision making: Patient seen at 8:45 PM on 12/22/2015.  The patient was discussed with Dr. Sherry Ruffing and Dr. [name]. What exists of the patient's chart was reviewed in depth.  Clinical condition: stable.      Edwin Dada [facility] Pager [phone]

## 2015-12-22 NOTE — Progress Notes (Signed)
Patient received to room from ED and oriented to room. Tele monitoring completed. Will complete skin assessment when another RN is available. Patient reports no chest pain at this time and is ambulating around the room. Will continue to monitor, call light is within reach.

## 2015-12-22 NOTE — ED Provider Notes (Signed)
[facility] DEPT Provider Note   CSN: [id number] Arrival date & time: 12/22/15  1723     History   Chief Complaint Chief Complaint  Patient presents with  . Chest Pain    HPI Jonathon [name] is a 65 y.o. male with a past medical history significant for stroke, CHF, COPD, hypertension, obesity, prior spontaneous pneumothorax status post bleb stapling presents with chest pain, shortness of breath, nausea, and fatigue. Patient says that today, at around 4 PM, he was getting out of the shower and had onset of chest pain. He describes it as a "person sitting on my chest". He says the pain is a 9 out of 10 severity and was not relieved with the nitroglycerin and aspirin given by EMS. He reports associated shortness of breath and nausea but no vomiting. He denies radiation of pain. He denies diaphoresis. He reports some fatigue and lightheadedness. He says that it is nonpleuritic and he has no chest trauma. He denies any recent fevers, chills, cough, abdominal pain, constipation, diarrhea, dysuria. He denies any unilateral or bilateral leg pain or edema. He denies any other complaints upon arrival. He denies any exertional component to his symptoms.    The history is provided by the patient, medical records, a significant other and a relative. No language interpreter was used.  Chest Pain   This is a new problem. The current episode started 1 to 2 hours ago. The problem occurs constantly. The problem has been gradually worsening. The pain is present in the substernal region. The pain is at a severity of 9/10. The pain is severe. The quality of the pain is described as pressure-like and heavy. Duration of episode(s) is 2 hours. Associated symptoms include malaise/fatigue, nausea and shortness of breath. Pertinent negatives include no abdominal pain, no back pain, no cough, no diaphoresis, no dizziness, no exertional chest pressure, no fever, no headaches, no leg pain, no lower extremity edema,  no near-syncope, no numbness, no palpitations, no vomiting and no weakness. He has tried nitroglycerin for the symptoms. The treatment provided no relief. Risk factors include male gender.  His past medical history is significant for CHF, hypertension and spontaneous pneumothorax.    Past Medical History:  Diagnosis Date  . Chronic cough   . COPD, severe ([place])    PT DENIES REGULAR DAILY INHALER ANY PRN  . Harsh voice quality    PT STATES NORMAL FOR HIM  . History of CHF (congestive heart failure) MONITORED BY DR BOUSKA   DIASTOLIC  . History of CVA (cerebrovascular accident) 02-22-2011   LEFT BRAIN STEM PONTINE HEMORRHAGE--  RIGHT SIDED WEAKNESS  . Hydrocele, left   . Hypertension   . Pre-operative clearance    GIVEN BY DR Coletta Memos  . Short of breath on exertion   . Smoker   . Weakness of right side of body    S/P CVA   FEB 2013    Patient Active Problem List   Diagnosis Date Noted  . Pain in joint, shoulder region 11/05/2013  . Abnormal CT of the chest 10/09/2013  . Chest wall pain 10/04/2013  . Mid back pain 10/04/2013  . DM II (diabetes mellitus, type II), controlled ([place]) 09/28/2013  . Smoker   . COPD GOLD II with reversibility    . Chest pain 04/30/2011  . COPD (chronic obstructive pulmonary disease) ([place]) 04/30/2011  . Alcohol intoxication ([place]) 04/30/2011  . Essential hypertension, benign 04/30/2011  . History of Left Stroke, hemorrhagic 04/30/2011  . Hypokalemia 04/30/2011  .  Pontine hemorrhage ([place]) 02/23/2011    Past Surgical History:  Procedure Laterality Date  . ABDOMINAL ANGIOGRAM  09/04/2012   Procedure: ABDOMINAL ANGIOGRAM;  Surgeon: Laverda Page, MD;  Location: [facility] CATH LAB;  Service: Cardiovascular;;  . CARDIAC CATHETERIZATION  05-02-2011  DR Johnsie Cancel   MILD NONOBSTRUCTIVE CAD/ LAD, OM , AND D1/ EF 60^  . CARDIAC CATHETERIZATION  AUG 2000   NON-OBSTRUTIVE CAD/ EF 68%/ 25% PROXIMAL LAD/ 20% MID CIRCUMFLEX  . HYDROCELE EXCISION Left 04/24/2012    Procedure: HYDROCELECTOMY ADULT;  Surgeon: Fredricka Bonine, MD;  Location: [facility];  Service: Urology;  Laterality: Left;  90 mins req for this case     . INGUINAL HERNIA REPAIR Left   . LEFT AND RIGHT HEART CATHETERIZATION WITH CORONARY ANGIOGRAM [name] 09/04/2012   Procedure: LEFT AND RIGHT HEART CATHETERIZATION WITH CORONARY ANGIOGRAM;  Surgeon: Laverda Page, MD;  Location: [facility] C[facility] LAB;  Service: Cardiovascular;  Laterality: [name];  . LEFT HEART CATHETERIZATION WITH CORONARY ANGIOGRAM [name] 05/02/2011   Procedure: LEFT HEART CATHETERIZATION WITH CORONARY ANGIOGRAM;  Surgeon: Josue Hector, MD;  Location: [facility] CATH LAB;  Service: Cardiovascular;  Laterality: [name];  . THORACOTOMY/LOBECTOMY Right 03-06-2008   AND STAPLING OF BLEBS FOR SPONTANOUS PNEUMOTHORAX  . TRANSTHORACIC ECHOCARDIOGRAM  02-17-2011   MODERATE LVH/ LVSF NORMAL/ EF 40-98%/ GRADE I DIASTOLIC DYSFUNCTION       Home Medications    Prior to Admission medications   Medication Sig Start Date End Date Taking? Authorizing Provider  albuterol (PROVENTIL) (2.5 MG/3ML) 0.083% nebulizer solution Take 2.5 mg by nebulization every 4 (four) hours as needed for wheezing or shortness of breath.    Historical Provider, MD  Albuterol Sulfate (PROAIR RESPICLICK) 119 (90 BASE) MCG/ACT AEPB Inhale 2 puffs into the lungs 4 (four) times daily as needed. 02/24/14   Tammy S Parrett, NP  amLODipine (NORVASC) 10 MG tablet Take 10 mg by mouth every morning.     Historical Provider, MD  aspirin EC 325 MG tablet Take 325 mg by mouth every morning.     Historical Provider, MD  atorvastatin (LIPITOR) 10 MG tablet Take 1/2 tablet by mouth every morning    Historical Provider, MD  budesonide-formoterol (SYMBICORT) 160-4.5 MCG/ACT inhaler Inhale 2 puffs into the lungs 2 (two) times daily.    Historical Provider, MD  cloNIDine (CATAPRES) 0.3 MG tablet Take 0.3 mg by mouth 2 (two) times daily.    Historical Provider, MD  Cyanocobalamin  (VITAMIN B 12 PO) Take 1 tablet by mouth every morning.     Historical Provider, MD  cyclobenzaprine (FLEXERIL) 5 MG tablet Take 5 mg by mouth at bedtime.  02/03/14   Historical Provider, MD  DULoxetine (CYMBALTA) 30 MG capsule Take 30 mg by mouth every morning.  02/03/14   Historical Provider, MD  gabapentin (NEURONTIN) 400 MG capsule Take 400 mg by mouth 3 (three) times daily.     Historical Provider, MD  ibuprofen (ADVIL,MOTRIN) 200 MG tablet Take 400 mg by mouth every 6 (six) hours as needed for moderate pain.    Historical Provider, MD  irbesartan (AVAPRO) 150 MG tablet TAKE ONE (1) TABLET BY MOUTH EVERY DAY 04/09/15   Tanda Rockers, MD  metFORMIN (GLUCOPHAGE) 500 MG tablet Take 500 mg by mouth daily with breakfast.    Historical Provider, MD  nitroGLYCERIN (NITROSTAT) 0.4 MG SL tablet Place 0.4 mg under the tongue every 5 (five) minutes as needed for chest pain.    Historical Provider, MD  nortriptyline (PAMELOR) 25 MG capsule Take 25 mg by mouth at bedtime.    Historical Provider, MD  omeprazole (PRILOSEC) 20 MG capsule Take 20 mg by mouth 2 (two) times daily before a meal.    Historical Provider, MD  Psyllium ([employer] NATURAL DAILY FIBER PO) Take 1 tablet by mouth every morning.    Historical Provider, MD    Family History Family History  Problem Relation Age of Onset  . Hypertension Mother   . Lung disease Father     died of "colapsed lung" per patient  . Asthma Father   . Colon cancer Neg Hx   . Stomach cancer Neg Hx     Social History Social History  Substance Use Topics  . Smoking status: Current Every Day Smoker    Packs/day: 1.00    Years: 48.00    Types: Cigarettes  . Smokeless tobacco: Never Used     Comment: 1/2 ppd 01/27/14  . Alcohol use 0.0 oz/week     Comment: OCCASIONAL--       Allergies   Patient has no known allergies.   Review of Systems Review of Systems  Constitutional: Positive for malaise/fatigue. Negative for activity change, chills, diaphoresis,  fatigue and fever.  HENT: Negative for congestion and rhinorrhea.   Eyes: Negative for visual disturbance.  Respiratory: Positive for chest tightness and shortness of breath. Negative for cough, choking, wheezing and stridor.   Cardiovascular: Positive for chest pain. Negative for palpitations, leg swelling and near-syncope.  Gastrointestinal: Positive for nausea. Negative for abdominal distention, abdominal pain, blood in stool, constipation, diarrhea and vomiting.  Genitourinary: Negative for difficulty urinating, dysuria, flank pain and frequency.  Musculoskeletal: Negative for back pain and gait problem.  Skin: Negative for rash and wound.  Neurological: Negative for dizziness, weakness, light-headedness, numbness and headaches.  Psychiatric/Behavioral: Negative for agitation and confusion.  All other systems reviewed and are negative.    Physical Exam Updated Vital Signs BP 133/84   Pulse 75   Temp 98.1 F (36.7 C) (Oral)   Resp 17   Ht 6' (1.829 m)   SpO2 95%   Physical Exam  Constitutional: He appears well-developed and well-nourished.  HENT:  Head: Normocephalic and atraumatic.  Mouth/Throat: Oropharynx is clear and moist. No oropharyngeal exudate.  Eyes: Conjunctivae are normal.  Neck: Neck supple.  Cardiovascular: Normal rate and regular rhythm.   No murmur heard. Pulmonary/Chest: Effort normal and breath sounds normal. No respiratory distress. He has no wheezes. He has no rales. He exhibits no tenderness.  Abdominal: Soft. There is no tenderness.  Musculoskeletal: He exhibits no edema or tenderness.  Neurological: He is alert.  Skin: Skin is warm and dry. Capillary refill takes less than 2 seconds.  Psychiatric: He has a normal mood and affect.  Nursing note and vitals reviewed.    ED Treatments / Results  Labs (all labs ordered are listed, but only abnormal results are displayed) Labs Reviewed  BASIC METABOLIC PANEL - Abnormal; Notable for the following:         Result Value   Potassium 3.4 (*)    CO2 21 (*)    Glucose, Bld 125 (*)    Calcium 8.7 (*)    GFR calc non Af Amer 59 (*)    All other components within normal limits  CBC - Abnormal; Notable for the following:    WBC 13.4 (*)    All other components within normal limits  TROPONIN I - Abnormal; Notable for the following:  Troponin I 2.89 (*)    All other components within normal limits  GLUCOSE, CAPILLARY - Abnormal; Notable for the following:    Glucose-Capillary 147 (*)    All other components within normal limits  BRAIN NATRIURETIC PEPTIDE  TROPONIN I  I-STAT TROPOININ, ED    EKG  EKG Interpretation  Date/Time:  Tuesday December 22 2015 17:33:12 EST Ventricular Rate:  85 PR Interval:    QRS Duration: 94 QT Interval:  405 QTC Calculation: 482 R Axis:   -48 Text Interpretation:  Sinus rhythm Left anterior fascicular block ST elevation, consider inferior injury Borderline prolonged QT interval No STEMI Abnormal ECG Confirmed by Sherry Ruffing MD, Anirudh Baiz [phone]) on 12/22/2015 6:30:43 PM       Radiology Dg Chest 2 View  Result Date: 12/22/2015 CLINICAL DATA:  65 y/o  M; chest pain. EXAM: CHEST  2 VIEW COMPARISON:  01/09/2015 chest radiograph FINDINGS: Stable cardiac silhouette within normal limits given projection and technique. Emphysema and left mid lung zone bullous changes are stable. No focal consolidation. No pleural effusion. Right hilar surgical clips. No acute osseous abnormality identified IMPRESSION: No active cardiopulmonary disease.  Emphysema with bullous changes. Electronically Signed   By: Kristine Garbe [name]   On: 12/22/2015 19:11    Procedures Procedures (including critical care time)  Medications Ordered in ED Medications  gi cocktail (Maalox,Lidocaine,Donnatal) (not administered)  acetaminophen (TYLENOL) tablet 650 mg (not administered)  ondansetron (ZOFRAN) injection 4 mg (not administered)  enoxaparin (LOVENOX) injection 40 mg (40 mg  Subcutaneous Given 12/22/15 2340)  morphine 2 MG/ML injection 2 mg (not administered)  insulin aspart (novoLOG) injection 0-9 Units (0 Units Subcutaneous Not Given 12/23/15 0009)  pantoprazole (PROTONIX) EC tablet 40 mg (not administered)  mometasone-formoterol (DULERA) 200-5 MCG/ACT inhaler 2 puff (2 puffs Inhalation Not Given 12/22/15 2303)  cyclobenzaprine (FLEXERIL) tablet 5 mg (5 mg Oral Given 12/22/15 2338)  amLODipine (NORVASC) tablet 10 mg (not administered)  aspirin EC tablet 325 mg (not administered)  atorvastatin (LIPITOR) tablet 5 mg (not administered)  gabapentin (NEURONTIN) capsule 400 mg (400 mg Oral Given 12/22/15 2338)  nortriptyline (PAMELOR) capsule 25 mg (25 mg Oral Given 12/22/15 2338)  cloNIDine (CATAPRES) tablet 0.3 mg (0.3 mg Oral Given 12/22/15 2339)  DULoxetine (CYMBALTA) DR capsule 30 mg (not administered)  morphine 4 MG/ML injection 4 mg (4 mg Intravenous Given 12/22/15 1812)  sodium chloride 0.9 % bolus 500 mL (0 mLs Intravenous Stopped 12/22/15 2023)  ondansetron (ZOFRAN) injection 4 mg (4 mg Intravenous Given 12/22/15 1812)     Initial Impression / Assessment and Plan / ED Course  I have reviewed the triage vital signs and the nursing notes.  Pertinent labs & imaging results that were available during my care of the patient were reviewed by me and considered in my medical decision making (see chart for details).  Clinical Course     Jonathon [name] is a 65 y.o. male with a past medical history significant for stroke, CHF, COPD, hypertension, obesity, prior spontaneous pneumothorax status post bleb stapling presents with chest pain, shortness of breath, nausea, and fatigue.  History and exam are seen above.   On exam, patient has a right-sided thoracotomy scar that is well-healed and nontender. Patient's lungs are clear bilaterally with no wheezing. No crackles. Chest is nontender. Abdomen is nontender. Pulses are symmetric and long-term use. No significant  edema in lower extremity. Remarkable neurologic exam. Gait deferred due to patient's symptoms.   Given patient's description of symptoms, patient was worked  up for cardiac or pulmonary etiology of symptoms. With history of pneumothorax and bleb, chest x-ray was also ordered. Lungs are clear and symmetric, doubt pneumothorax at this time. Patient our he received aspirin and nitroglycerin. For his pain, will provide morphine. Patient will be given small amount of fluids given history of CHF. BNP will be ordered.   EKG showed sinus rhythm with some nonspecific ST abnormalities. No STEMI.  Score calculated as a 6. Patient will be worked up and will likely require admission given high risk chest pain.  Patient reported chest pain improved after pain medicine. Laboratory testing showed mild leukocytosis but normal troponin, nonelevated BNP, And slightly abnormal electrolytes. Chest x-ray showed no pneumonia.   Due to high-risk chest pain, patient will be admitted to hospitalist service. Patient admitted in stable condition.    Final Clinical Impressions(s) / ED Diagnoses   Final diagnoses:  Chest pain, unspecified type    Clinical Impression: 1. Chest pain, unspecified type   2. Atypical chest pain     Disposition: Admit to Hospitalist service    Courtney Paris, MD 12/23/15 [phone]

## 2015-12-22 NOTE — Consult Note (Signed)
CARDIOLOGY CONSULT NOTE  Patient ID: IBRAHAM [name] MRN: [id number] DOB/AGE: 1950/03/13 65 y.o.  Admit date: 12/22/2015 Referring Physician  Edwin Dada, MD Primary Physician:  Phineas Inches, MD Reason for Consultation  Chest pain  HPI: Jonathon [name]  is a 65 y.o. male  With  history of hypertension, hyperlipidemia, remote stroke without restrictive defect, ongoing tobacco use disorder, who has had coronary angiography on 09/04/2012 which had revealed mild diffuse coronary disease involving RCA, circumflex and LAD with 20-30% stenosis and mild calcification. Normal LVEF.  He has history of emphysema and right pneumothorax in 2010 due to emphysematous bleb while he was in [place] and has history of right upper lobe lobectomy. Unfortunately still continues to smoke. Is presently on home oxygen. He is now admitted to the hospital with chest pain that started this evening after he took a shower sudden onset of central chest pressure, chest tightness. He felt very fatigued and weak. He described pain is very severe and asked his wife to activate the EMS. In the emergency room he started to feel comfortable. No further chest pain. Initial troponin and EKG were within normal limits. I was consulted to see the patient regarding his chest pain.  He describes chest pain is sharp pain across the chest. He states that he couldn't take deep breaths and could not move. He also felt markedly dyspneic. Chest pain was across his shoulders and also in the middle of his chest. No other associated symptoms except he felt very be tired. Several nitroglycerin and aspirin did not help. He received narcotics in the hospital, states that he is now feeling better and his chest pain is improved.  Past Medical History:  Diagnosis Date  . Chronic cough   . COPD, severe ([place])    PT DENIES REGULAR DAILY INHALER ANY PRN  . Harsh voice quality    PT STATES NORMAL FOR HIM  . History of CHF  (congestive heart failure) MONITORED BY DR BOUSKA   DIASTOLIC  . History of CVA (cerebrovascular accident) 02-22-2011   LEFT BRAIN STEM PONTINE HEMORRHAGE--  RIGHT SIDED WEAKNESS  . Hydrocele, left   . Hypertension   . Pre-operative clearance    GIVEN BY DR Coletta Memos  . Short of breath on exertion   . Smoker   . Weakness of right side of body    S/P CVA   FEB 2013     Past Surgical History:  Procedure Laterality Date  . ABDOMINAL ANGIOGRAM  09/04/2012   Procedure: ABDOMINAL ANGIOGRAM;  Surgeon: Laverda Page, MD;  Location: [facility] CATH LAB;  Service: Cardiovascular;;  . CARDIAC CATHETERIZATION  05-02-2011  DR Johnsie Cancel   MILD NONOBSTRUCTIVE CAD/ LAD, OM , AND D1/ EF 60^  . CARDIAC CATHETERIZATION  AUG 2000   NON-OBSTRUTIVE CAD/ EF 68%/ 25% PROXIMAL LAD/ 20% MID CIRCUMFLEX  . HYDROCELE EXCISION Left 04/24/2012   Procedure: HYDROCELECTOMY ADULT;  Surgeon: Fredricka Bonine, MD;  Location: [facility];  Service: Urology;  Laterality: Left;  90 mins req for this case     . INGUINAL HERNIA REPAIR Left   . LEFT AND RIGHT HEART CATHETERIZATION WITH CORONARY ANGIOGRAM [name] 09/04/2012   Procedure: LEFT AND RIGHT HEART CATHETERIZATION WITH CORONARY ANGIOGRAM;  Surgeon: Laverda Page, MD;  Location: [facility] CATH LAB;  Service: Cardiovascular;  Laterality: [name];  . LEFT HEART CATHETERIZATION WITH CORONARY ANGIOGRAM [name] 05/02/2011   Procedure: LEFT HEART CATHETERIZATION WITH CORONARY ANGIOGRAM;  Surgeon: Josue Hector, MD;  Location: [facility] CATH LAB;  Service: Cardiovascular;  Laterality: [name];  . THORACOTOMY/LOBECTOMY Right 03-06-2008   AND STAPLING OF BLEBS FOR SPONTANOUS PNEUMOTHORAX  . TRANSTHORACIC ECHOCARDIOGRAM  02-17-2011   MODERATE LVH/ LVSF NORMAL/ EF 02-77%/ GRADE I DIASTOLIC DYSFUNCTION     Family History  Problem Relation Age of Onset  . Hypertension Mother   . Stroke Mother   . Lung disease Father     died of "colapsed lung" per patient  . Asthma Father   . Colon  cancer Neg Hx   . Stomach cancer Neg Hx      Social History: Social History   Social History  . Marital status: Married    Spouse name: [name]  . Number of children: [name]  . Years of education: [name]   Occupational History  . Not on file.   Social History Main Topics  . Smoking status: Current Every Day Smoker    Packs/day: 1.00    Years: 48.00    Types: Cigarettes  . Smokeless tobacco: Never Used     Comment: 1/2 ppd 01/27/14  . Alcohol use 0.0 oz/week     Comment: OCCASIONAL--    . Drug use: No  . Sexual activity: Not on file   Other Topics Concern  . Not on file   Social History Narrative  . No narrative on file    ROS: General: no fevers/chills/night sweats Eyes: no blurry vision, diplopia, or amaurosis ENT: no sore throat or hearing loss Resp: Has chronic cough, dyspnea on exertion. CV: Chest pain new. no edema or palpitations GI: no abdominal pain, nausea, vomiting, diarrhea, or constipation GU: no dysuria, frequency, or hematuria Skin: no rash Neuro: no headache, numbness, tingling, or weakness of extremities Musculoskeletal: no joint pain or swelling Heme: no bleeding, DVT, or easy bruising Endo: no polydipsia or polyuria    Physical Exam: Blood pressure (!) 128/54, pulse 87, temperature 98.1 F (36.7 C), temperature source Oral, resp. rate 16, height 6' (1.829 m), SpO2 (!) 89 %.    General appearance: alert, cooperative, appears stated age, no distress and mildly obese Lungs: clear to auscultation bilaterally and barrel shaped Chest wall: no tenderness Heart: regular rate and rhythm, S1, S2 normal, no murmur, click, rub or gallop Abdomen: soft, non-tender; bowel sounds normal; no masses,  no organomegaly and Obese Extremities: extremities normal, atraumatic, no cyanosis or edema Pulses: 2+ and symmetric Neurologic: Grossly normal  Labs:   Lab Results  Component Value Date   WBC 13.4 (H) 12/22/2015   HGB 14.8 12/22/2015   HCT 44.0 12/22/2015   MCV  84.5 12/22/2015   PLT 254 12/22/2015    Recent Labs Lab 12/22/15 1804  NA 136  K 3.4*  CL 102  CO2 21*  BUN 10  CREATININE 1.24  CALCIUM 8.7*  GLUCOSE 125*    Lipid Panel     Component Value Date/Time   CHOL 103 04/30/2011 0847   TRIG 76 04/30/2011 0847   HDL 32 (L) 04/30/2011 0847   CHOLHDL 3.2 04/30/2011 0847   VLDL 15 04/30/2011 0847   LDLCALC 56 04/30/2011 0847    Recent Labs  12/22/15 1806  BNP 16.8    HEMOGLOBIN A1C Lab Results  Component Value Date   HGBA1C 6.7 (H) 09/28/2013   MPG 146 (H) 09/28/2013    Cardiac Panel (last 3 results) No results for input(s): CKTOTAL, CKMB, TROPONINI, RELINDX in the last 8760 hours.  Lab Results  Component Value Date   CKTOTAL 91 01/13/2014  CKMB 2.0 01/13/2014   TROPONINI <0.03 01/13/2014    EKG 12/22/2015: Normal sinus rhythm at a rate of 85 bpm, left axis deviation. No evidence of ischemia. Baseline artifact.   Echocardiogram 01/30/2011: Normal left ventricular systolic function, EF 85-63% without wall motion abnormality. Mild diastolic dysfunction, mild left atrial enlargement.   Radiology: Dg Chest 2 View  Result Date: 12/22/2015 CLINICAL DATA:  65 y/o  M; chest pain. EXAM: CHEST  2 VIEW COMPARISON:  01/09/2015 chest radiograph FINDINGS: Stable cardiac silhouette within normal limits given projection and technique. Emphysema and left mid lung zone bullous changes are stable. No focal consolidation. No pleural effusion. Right hilar surgical clips. No acute osseous abnormality identified IMPRESSION: No active cardiopulmonary disease.  Emphysema with bullous changes. Electronically Signed   By: Kristine Garbe [name]   On: 12/22/2015 19:11   ASSESSMENT AND PLAN:  1. Atypical chest pain, no EKG abnormalities and facet of cardiac troponin negative. 2. Emphysema with history of pneumothorax in the past, left upper lobe lobectomy, continued tobacco use disorder and on home oxygen. 3. Hyperlipidemia 4.  Hypertension 5. Diabetes mellitus type 2 controlled without hypoglycemia without insulin.  Recommendation: Agree with admission to the hospital and observation. Set him up for nuclear stress in the morning, only if cardiac markers a posteriorly repeat coronary angiography. Smokeless sedation is indicated.  Adrian Prows, MD 12/22/2015, 9:39 PM [facility] [place] Pager: [phone] Office: [phone] If no answer Cell [phone]

## 2015-12-22 NOTE — Progress Notes (Signed)
Telemetry called, patient had six beat run of Vtach. Advised I was in the room and that the patient was trying to pull his self up to take his medication. Will continue to monitor, call light within reach

## 2015-12-22 NOTE — ED Notes (Signed)
MD at bedside. 

## 2015-12-22 NOTE — ED Triage Notes (Signed)
Pt brought in [facility]. Pt c/o of chest on right and left side. Pt denies radiation. Pt c/o dizziness and shortness of breath. O2 at 94% on Ra. Pt stated that he began to have chest pain when he was getting out of shower about an hour ago. Pt received 324 of ASA and 6 tablets of nitro with no relief on transport.

## 2015-12-23 ENCOUNTER — Encounter (HOSPITAL_COMMUNITY)
Admission: EM | Disposition: A | Discharge status: Home / Self Care | Payer: Self-pay | Source: Home / Self Care | Attending: Internal Medicine

## 2015-12-23 DIAGNOSIS — R079 Chest pain, unspecified: Secondary | ICD-10-CM | POA: Diagnosis not present

## 2015-12-23 DIAGNOSIS — I10 Essential (primary) hypertension: Secondary | ICD-10-CM | POA: Diagnosis not present

## 2015-12-23 DIAGNOSIS — R0789 Other chest pain: Secondary | ICD-10-CM | POA: Diagnosis not present

## 2015-12-23 HISTORY — PX: CARDIAC CATHETERIZATION: SHX172

## 2015-12-23 LAB — TROPONIN I
Troponin I: 3.71 ng/mL (ref <0.03)
Troponin I: 3.4 ng/mL (ref <0.03)
Troponin I: 2.83 ng/mL (ref <0.03)
Troponin I: 2.25 ng/mL (ref <0.03)

## 2015-12-23 LAB — LIPID PANEL
Cholesterol: 105 mg/dL (ref 0–200)
HDL: 54 mg/dL (ref >40)
LDL Cholesterol: 41 mg/dL (ref 0–99)
Total CHOL/HDL Ratio: 1.9 RATIO
Triglycerides: 51 mg/dL (ref <150)
VLDL: 10 mg/dL (ref 0–40)

## 2015-12-23 LAB — GLUCOSE, CAPILLARY
Glucose-Capillary: 117 mg/dL — ABNORMAL HIGH (ref 65–99)
Glucose-Capillary: 119 mg/dL — ABNORMAL HIGH (ref 65–99)
Glucose-Capillary: 147 mg/dL — ABNORMAL HIGH (ref 65–99)

## 2015-12-23 LAB — PROTIME-INR
INR: 1.07
Prothrombin Time: 14 seconds (ref 11.4–15.2)

## 2015-12-23 LAB — POCT ACTIVATED CLOTTING TIME: Activated Clotting Time: 241 seconds

## 2015-12-23 LAB — HEPARIN LEVEL (UNFRACTIONATED): Heparin Unfractionated: 0.38 IU/mL (ref 0.30–0.70)

## 2015-12-23 SURGERY — LEFT HEART CATH AND CORONARY ANGIOGRAPHY

## 2015-12-23 MED ORDER — SODIUM CHLORIDE 0.9% FLUSH
3.0000 mL | Freq: Two times a day (BID) | INTRAVENOUS | Status: DC
  Administered 2015-12-23 – 2015-12-24 (×3): 3 mL via INTRAVENOUS

## 2015-12-23 MED ORDER — HEPARIN (PORCINE) IN NACL 100-0.45 UNIT/ML-% IJ SOLN
1300.0000 [IU]/h | INTRAMUSCULAR | Status: DC
  Administered 2015-12-23: 1300 [IU]/h via INTRAVENOUS
  Filled 2015-12-23: qty 250

## 2015-12-23 MED ORDER — HYDROMORPHONE HCL 1 MG/ML IJ SOLN
INTRAMUSCULAR | Status: DC | PRN
  Administered 2015-12-23: 0.5 mg via INTRAVENOUS

## 2015-12-23 MED ORDER — BISOPROLOL FUMARATE 5 MG PO TABS
5.0000 mg | ORAL_TABLET | Freq: Every day | ORAL | Status: DC
  Administered 2015-12-23 – 2015-12-25 (×3): 5 mg via ORAL
  Filled 2015-12-23 (×3): qty 1

## 2015-12-23 MED ORDER — HEPARIN (PORCINE) IN NACL 2-0.9 UNIT/ML-% IJ SOLN
INTRAMUSCULAR | Status: DC | PRN
  Administered 2015-12-23: 1000 mL

## 2015-12-23 MED ORDER — SODIUM CHLORIDE 0.9 % WEIGHT BASED INFUSION: 1.0000 mL/kg/h | INTRAVENOUS | Status: AC

## 2015-12-23 MED ORDER — HEPARIN SODIUM (PORCINE) 1000 UNIT/ML IJ SOLN
INTRAMUSCULAR | Status: DC | PRN
  Administered 2015-12-23: 6000 [IU] via INTRAVENOUS

## 2015-12-23 MED ORDER — SODIUM CHLORIDE 0.9 % WEIGHT BASED INFUSION: 1.0000 mL/kg/h | INTRAVENOUS | Status: DC

## 2015-12-23 MED ORDER — SODIUM CHLORIDE 0.9 % WEIGHT BASED INFUSION
3.0000 mL/kg/h | INTRAVENOUS | Status: AC
  Administered 2015-12-23: 1 mL via INTRAVENOUS
  Administered 2015-12-23: 3 mL/kg/h via INTRAVENOUS

## 2015-12-23 MED ORDER — HEPARIN SODIUM (PORCINE) 1000 UNIT/ML IJ SOLN
INTRAMUSCULAR | Status: AC
  Filled 2015-12-23: qty 1

## 2015-12-23 MED ORDER — TICAGRELOR 90 MG PO TABS
90.0000 mg | ORAL_TABLET | Freq: Two times a day (BID) | ORAL | Status: DC
  Administered 2015-12-23 – 2015-12-25 (×4): 90 mg via ORAL
  Filled 2015-12-23 (×4): qty 1

## 2015-12-23 MED ORDER — SODIUM CHLORIDE 0.9% FLUSH: 3.0000 mL | INTRAVENOUS | Status: DC | PRN

## 2015-12-23 MED ORDER — TICAGRELOR 90 MG PO TABS
ORAL_TABLET | ORAL | Status: AC
  Filled 2015-12-23: qty 2

## 2015-12-23 MED ORDER — LIDOCAINE HCL (PF) 1 % IJ SOLN
INTRAMUSCULAR | Status: AC
  Filled 2015-12-23: qty 30

## 2015-12-23 MED ORDER — LIDOCAINE HCL (PF) 1 % IJ SOLN
INTRAMUSCULAR | Status: DC | PRN
  Administered 2015-12-23: 2 mL

## 2015-12-23 MED ORDER — TICAGRELOR 90 MG PO TABS
ORAL_TABLET | ORAL | Status: DC | PRN
  Administered 2015-12-23: 180 mg via ORAL

## 2015-12-23 MED ORDER — VERAPAMIL HCL 2.5 MG/ML IV SOLN
INTRA_ARTERIAL | Status: DC | PRN
  Administered 2015-12-23: 10 mL via INTRA_ARTERIAL

## 2015-12-23 MED ORDER — NITROGLYCERIN 1 MG/10 ML FOR IR/CATH LAB
INTRA_ARTERIAL | Status: AC
  Filled 2015-12-23: qty 10

## 2015-12-23 MED ORDER — SODIUM CHLORIDE 0.9 % WEIGHT BASED INFUSION: 3.0000 mL/kg/h | INTRAVENOUS | Status: DC

## 2015-12-23 MED ORDER — ASPIRIN EC 81 MG PO TBEC
81.0000 mg | DELAYED_RELEASE_TABLET | Freq: Every day | ORAL | Status: DC
  Administered 2015-12-24 – 2015-12-25 (×2): 81 mg via ORAL
  Filled 2015-12-23 (×2): qty 1

## 2015-12-23 MED ORDER — VERAPAMIL HCL 2.5 MG/ML IV SOLN
INTRAVENOUS | Status: AC
  Filled 2015-12-23: qty 2

## 2015-12-23 MED ORDER — HEPARIN BOLUS VIA INFUSION
4000.0000 [IU] | Freq: Once | INTRAVENOUS | Status: AC
  Administered 2015-12-23: 4000 [IU] via INTRAVENOUS
  Filled 2015-12-23: qty 4000

## 2015-12-23 MED ORDER — HEPARIN (PORCINE) IN NACL 2-0.9 UNIT/ML-% IJ SOLN
INTRAMUSCULAR | Status: AC
  Filled 2015-12-23: qty 1000

## 2015-12-23 MED ORDER — SODIUM CHLORIDE 0.9% FLUSH
3.0000 mL | Freq: Two times a day (BID) | INTRAVENOUS | Status: DC
  Administered 2015-12-23: 3 mL via INTRAVENOUS

## 2015-12-23 MED ORDER — MIDAZOLAM HCL 2 MG/2ML IJ SOLN
INTRAMUSCULAR | Status: AC
  Filled 2015-12-23: qty 2

## 2015-12-23 MED ORDER — MIDAZOLAM HCL 2 MG/2ML IJ SOLN
INTRAMUSCULAR | Status: DC | PRN
  Administered 2015-12-23: 2 mg via INTRAVENOUS

## 2015-12-23 MED ORDER — HYDROMORPHONE HCL 1 MG/ML IJ SOLN
INTRAMUSCULAR | Status: AC
  Filled 2015-12-23: qty 1

## 2015-12-23 MED ORDER — POTASSIUM CHLORIDE CRYS ER 20 MEQ PO TBCR
40.0000 meq | EXTENDED_RELEASE_TABLET | Freq: Once | ORAL | Status: AC
  Administered 2015-12-23: 40 meq via ORAL
  Filled 2015-12-23: qty 2

## 2015-12-23 MED ORDER — SODIUM CHLORIDE 0.9 % IV SOLN: 250.0000 mL | INTRAVENOUS | Status: DC | PRN

## 2015-12-23 MED ORDER — IOPAMIDOL (ISOVUE-370) INJECTION 76%
INTRAVENOUS | Status: DC | PRN
  Administered 2015-12-23: 65 mL via INTRAVENOUS

## 2015-12-23 MED ORDER — SODIUM CHLORIDE 0.9% FLUSH
3.0000 mL | INTRAVENOUS | Status: DC | PRN
Start: 2015-12-23 — End: 2015-12-23

## 2015-12-23 MED ORDER — FENTANYL CITRATE (PF) 100 MCG/2ML IJ SOLN
INTRAMUSCULAR | Status: AC
  Filled 2015-12-23: qty 2

## 2015-12-23 SURGICAL SUPPLY — 10 items
CATH HEARTRAIL 6F IL3.5 (CATHETERS) ×1 IMPLANT
CATH INFINITI JR4 5F (CATHETERS) ×1 IMPLANT
DEVICE RAD COMP TR BAND LRG (VASCULAR PRODUCTS) ×1 IMPLANT
GLIDESHEATH SLEND A-KIT 6F 20G (SHEATH) ×1 IMPLANT
GUIDEWIRE INQWIRE 1.5J.035X260 (WIRE) IMPLANT
INQWIRE 1.5J .035X260CM (WIRE) ×2
KIT HEART LEFT (KITS) ×2 IMPLANT
PACK CARDIAC CATHETERIZATION (CUSTOM PROCEDURE TRAY) ×2 IMPLANT
TRANSDUCER W/STOPCOCK (MISCELLANEOUS) ×2 IMPLANT
TUBING CIL FLEX 10 FLL-RA (TUBING) ×2 IMPLANT

## 2015-12-23 NOTE — Progress Notes (Signed)
Lab called critical troponin of 2.89. Paged [facility]. Patient asleep, has been chest pain free since arrival to the floor.Pt currently NSR with hr of 88.  Will await orders if any. Call light within reach.

## 2015-12-23 NOTE — Progress Notes (Signed)
Patient sitting up, family present at bedside. No other needs at this time. Call light within reach

## 2015-12-23 NOTE — Progress Notes (Signed)
Received note from [facility] stating to contact cardiology. Paged the fellow on call. Received a call back from Dr Radford Pax stating that she does not cover for Dr. [name]. Did not give another physician's name before hanging up.   There aren't any other physicians on call or any other info on the sticky notes. Patient is still asymptomatic and is currently sleeping. Lovenox given earlier per order. Will continue to monitor.

## 2015-12-23 NOTE — Care Management Obs Status (Signed)
[place] NOTIFICATION   Patient Details  Name: ABAS LEICHT MRN: [id number] Date of Birth: Oct 10, 1950   Medicare Observation Status Notification Given:  Yes    CrutchfieldAntony Haste, RN 12/23/2015, 1:49 PM

## 2015-12-23 NOTE — Progress Notes (Signed)
Subjective:  No further chest pain.  Objective:  Vital Signs in the last 24 hours: Temp:  [97.7 F (36.5 C)-98.5 F (36.9 C)] 98.4 F (36.9 C) (12/06 0741) Pulse Rate:  [71-100] 71 (12/06 0741) Resp:  [12-23] 18 (12/06 0741) BP: (122-159)/(54-88) 133/72 (12/06 0741) SpO2:  [89 %-97 %] 92 % (12/06 0741) Weight:  [97.8 kg (215 lb 8 oz)] 97.8 kg (215 lb 8 oz) (12/05 2203)  Intake/Output from previous day: 12/05 0701 - 12/06 0700 In: 500 [IV Piggyback:500] Out: -   Physical Exam:  General appearance: alert, cooperative, appears stated age, no distress and mildly obese Lungs: clear to auscultation bilaterally and barrel shaped Chest wall: no tenderness Heart: regular rate and rhythm, S1, S2 normal, no murmur, click, rub or gallop Abdomen: soft, non-tender; bowel sounds normal; no masses,  no organomegaly and Obese Extremities: extremities normal, atraumatic, no cyanosis or edema Pulses: 2+ and symmetric Neurologic: Grossly normal Lab Results: BMP  Recent Labs  01/09/15 1930 12/22/15 1804  NA 137 136  K 3.3* 3.4*  CL 104 102  CO2 24 21*  GLUCOSE 145* 125*  BUN 13 10  CREATININE 1.11 1.24  CALCIUM 8.6* 8.7*  GFRNONAA >60 59*  GFRAA >60 >60    CBC  Recent Labs Lab 12/22/15 1804  WBC 13.4*  RBC 5.21  HGB 14.8  HCT 44.0  PLT 254  MCV 84.5  MCH 28.4  MCHC 33.6  RDW 15.2    HEMOGLOBIN A1C Lab Results  Component Value Date   HGBA1C 6.7 (H) 09/28/2013   MPG 146 (H) 09/28/2013    Cardiac Panel (last 3 results)  Recent Labs  12/22/15 2303 12/23/15 0500  TROPONINI 2.89* 3.71*   Recent Labs  01/09/15 1930  PROT 6.1*  ALBUMIN 3.0*  AST 12*  ALT 13*  ALKPHOS 88  BILITOT 0.5    Imaging: Imaging results have been reviewed  Cardiac Studies:  EKG 12/22/2015: Normal sinus rhythm at a rate of 85 bpm, left axis deviation. No evidence of ischemia. Baseline artifact.   Echocardiogram 01/30/2011: Normal left ventricular systolic function, EF 43-83%  without wall motion abnormality. Mild diastolic dysfunction, mild left atrial enlargement.   Assessment/Plan:  1. NSTEMI 2. Emphysema with history of pneumothorax in the past, left upper lobe lobectomy, continued tobacco use disorder and on home oxygen. 3. Hyperlipidemia 4. Hypertension 5. Diabetes mellitus type 2 controlled without hypoglycemia without insulin.  Recommendation: I have discussed the patient regarding NSTEMI, I have recommended proceeding with coronary angiography. I will start the patient on IV heparin, scheduled for catheterization this afternoon. Check lipid panel  Discussed risks, benefits and alternatives of angiogram including but not limited to <1% risk of death, stroke, MI, need for urgent surgical revascularization, renal failure, but not limited to thest. patient is willing to proceed.   Adrian Prows, [name] 12/23/2015, 9:26 AM [facility] Pager: [phone] Office: [phone] If no answer: [phone]

## 2015-12-23 NOTE — Progress Notes (Signed)
Patient's troponin at 3.71. Paged attending physician to advise. Pt asymptomatic, no chest pain, sleeping at this time.

## 2015-12-23 NOTE — Progress Notes (Signed)
ANTICOAGULATION CONSULT NOTE - Initial Consult  Pharmacy Consult for heparin Indication: chest pain/ACS  No Known Allergies  Patient Measurements: Height: 6' (182.9 cm) Weight: 215 lb 8 oz (97.8 kg) IBW/kg (Calculated) : 77.6 Heparin Dosing Weight: 97.2 kg  Vital Signs: Temp: 98.4 F (36.9 C) (12/06 0741) Temp Source: Oral (12/06 0741) BP: 133/72 (12/06 0741) Pulse Rate: 71 (12/06 0741)  Labs:  Recent Labs  12/22/15 1804 12/22/15 2303 12/23/15 0500  HGB 14.8  --   --   HCT 44.0  --   --   PLT 254  --   --   CREATININE 1.24  --   --   TROPONINI  --  2.89* 3.71*    Estimated Creatinine Clearance: 71 mL/min (by C-G formula based on SCr of 1.24 mg/dL).   Medical History: Past Medical History:  Diagnosis Date  . Chronic cough   . COPD, severe ([place])    PT DENIES REGULAR DAILY INHALER ANY PRN  . Harsh voice quality    PT STATES NORMAL FOR HIM  . History of CHF (congestive heart failure) MONITORED BY DR BOUSKA   DIASTOLIC  . History of CVA (cerebrovascular accident) 02-22-2011   LEFT BRAIN STEM PONTINE HEMORRHAGE--  RIGHT SIDED WEAKNESS  . Hydrocele, left   . Hypertension   . Pre-operative clearance    GIVEN BY DR Coletta Memos  . Short of breath on exertion   . Smoker   . Weakness of right side of body    S/P CVA   FEB 2013    Medications:  Prescriptions Prior to Admission  Medication Sig Dispense Refill Last Dose  . albuterol (PROVENTIL) (2.5 MG/3ML) 0.083% nebulizer solution Take 2.5 mg by nebulization every 4 (four) hours as needed for wheezing or shortness of breath.   Past Month at Unknown time  . Albuterol Sulfate (PROAIR RESPICLICK) 937 (90 BASE) MCG/ACT AEPB Inhale 2 puffs into the lungs 4 (four) times daily as needed. 1 each 5 12/22/2015 at Unknown time  . amLODipine (NORVASC) 10 MG tablet Take 10 mg by mouth every morning.    12/22/2015 at Unknown time  . aspirin EC 325 MG tablet Take 325 mg by mouth every morning.    12/22/2015 at Unknown time  .  atorvastatin (LIPITOR) 10 MG tablet Take 5 mg by mouth every morning. Take 1/2 tablet by mouth every morning   12/22/2015 at Unknown time  . budesonide-formoterol (SYMBICORT) 160-4.5 MCG/ACT inhaler Inhale 2 puffs into the lungs 2 (two) times daily.   12/22/2015 at Unknown time  . cloNIDine (CATAPRES) 0.3 MG tablet Take 0.3 mg by mouth 2 (two) times daily.   12/22/2015 at Unknown time  . Cyanocobalamin (VITAMIN B 12 PO) Take 1 tablet by mouth every morning.    12/22/2015 at Unknown time  . cyclobenzaprine (FLEXERIL) 5 MG tablet Take 5 mg by mouth at bedtime.    12/21/2015 at Unknown time  . DULoxetine (CYMBALTA) 30 MG capsule Take 30 mg by mouth every morning.    12/22/2015 at Unknown time  . gabapentin (NEURONTIN) 400 MG capsule Take 400 mg by mouth 3 (three) times daily.    12/22/2015 at Unknown time  . ibuprofen (ADVIL,MOTRIN) 200 MG tablet Take 400 mg by mouth every 6 (six) hours as needed for moderate pain.   Past Month at Unknown time  . irbesartan (AVAPRO) 150 MG tablet TAKE ONE (1) TABLET BY MOUTH EVERY DAY (Patient taking differently: TAKE ONE (1) TABLET BY MOUTH AS NEEDED FOR BP) 30  tablet 0 Past Month at Unknown time  . metFORMIN (GLUCOPHAGE) 500 MG tablet Take 500 mg by mouth daily with breakfast.   12/22/2015 at Unknown time  . nitroGLYCERIN (NITROSTAT) 0.4 MG SL tablet Place 0.4 mg under the tongue every 5 (five) minutes as needed for chest pain.   12/22/2015 at Unknown time  . nortriptyline (PAMELOR) 25 MG capsule Take 25 mg by mouth at bedtime.   12/21/2015 at Unknown time  . omeprazole (PRILOSEC) 40 MG capsule Take 40 mg by mouth 2 (two) times daily.   12/22/2015 at Unknown time    Assessment: 66 yo M with NSTEMI.  Pharmacy consulted to dose heparin.  Plan to have cardiac cath this afternoon.  TWB 97.8 kg, creat 1.24, CBC WNL.  Troponin 2.89 and 3.71.    Goal of Therapy:  Heparin level 0.3-0.7 units/ml Monitor platelets by anticoagulation protocol: Yes   Plan:  Give 4000 units bolus x  1 Start heparin infusion at 1300 units/hr Check anti-Xa level in 6 hours and daily while on heparin Continue to monitor H&H and platelets  F/u after cath  Eudelia Bunch, Pharm.D. [phone] 12/23/2015 9:45 AM

## 2015-12-23 NOTE — Progress Notes (Signed)
Patient ID: Jonathon [name], male   DOB: [birth year], 65 y.o.   MRN: [id number]    PROGRESS NOTE    MAANAV KASSABIAN  MPN:[id number] DOB: 1950-05-28 DOA: 12/22/2015  PCP: Phineas Inches, MD   Brief Narrative:  65 y.o. male with a past medical history significant for history of stroke, active smoking, COPD not on home O2, NIDDM, and HTN who presented with chest pain described as dull, central chest pressure, all across his chest "like someone was sitting on it", associated with fatigue and SOB. He had a LHC in 2015 that showed no significant disease.     ED course: - Afebrile, heart rate 90-100, respirations and pulse ox normal, BP 145/76 - Initial ECG showed old LAFB without changes and troponin was negative. - Na 136, K 3.4, Cr 1.24 (baseline 1.1), WBC 13.4, Hgb 14.8 - CXR showed emphysema without pneumonia - [facility] was asked to admit for observation, serial troponins and risk stratification.  Assessment & Plan:   Principal Problem:   Chest pain, atypical - denies chest pain this am, troponins however trending up: 2.89 --> 3.71 - keep on tele for now, follow up on cardiology recommendations - plan for stress test today  - keep NPO   Active Problems:   Essential hypertension, benign - reasonable inpatient control     Hypokalemia - mild, supplement, BMP in AM    History of Left Stroke, hemorrhagic    COPD GOLD II  - not in exacerbation at this time     Type 2 diabetes mellitus without complication, without long-term current use of insulin with complications of neuropathies  - continue on SSI for now while pt is NPO - continue Neurontin   DVT prophylaxis: Lovenox SQ Code Status: Full  Family Communication: Patient at bedside  Disposition Plan: to be determined   Consultants:   Cardiology   Procedures:   Stress test pending   Antimicrobials:   None  Subjective: Wants to be left alone so he can sleep, denies chest pain this AM  Objective: Vitals:   12/22/15 2203 12/23/15 0003 12/23/15 0410 12/23/15 0741  BP: (!) 155/79 (!) 146/68 122/70 133/72  Pulse: 81 88 73 71  Resp: '18 18 17 18  '$ Temp: 98.5 F (36.9 C) 98.5 F (36.9 C) 97.7 F (36.5 C) 98.4 F (36.9 C)  TempSrc: Oral Oral Oral Oral  SpO2: 95% 91% 92% 92%  Weight: 97.8 kg (215 lb 8 oz)     Height: 6' (1.829 m)       Intake/Output Summary (Last 24 hours) at 12/23/15 0918 Last data filed at 12/22/15 2023  Gross per 24 hour  Intake              500 ml  Output                0 ml  Net              500 ml   Filed Weights   12/22/15 2203  Weight: 97.8 kg (215 lb 8 oz)    Examination:  General exam: Appears calm and comfortable  Respiratory system: Clear to auscultation. Respiratory effort normal. Cardiovascular system: S1 & S2 heard, RRR. No JVD, rubs, gallops or clicks. No pedal edema. Gastrointestinal system: Abdomen is nondistended, soft and nontender. No organomegaly or masses felt.   Rest of the exam deferred per pt's request   Data Reviewed: I have personally reviewed following labs and imaging studies  CBC:  Recent Labs Lab  12/22/15 1804  WBC 13.4*  HGB 14.8  HCT 44.0  MCV 84.5  PLT 734   Basic Metabolic Panel:  Recent Labs Lab 12/22/15 1804  NA 136  K 3.4*  CL 102  CO2 21*  GLUCOSE 125*  BUN 10  CREATININE 1.24  CALCIUM 8.7*   Cardiac Enzymes:  Recent Labs Lab 12/22/15 2303 12/23/15 0500  TROPONINI 2.89* 3.71*   CBG:  Recent Labs Lab 12/23/15 0000 12/23/15 0626  GLUCAP 147* 119*   Urine analysis:    Component Value Date/Time   COLORURINE AMBER (A) 01/09/2015 2207   APPEARANCEUR CLEAR 01/09/2015 2207   LABSPEC 1.035 (H) 01/09/2015 2207   PHURINE 6.0 01/09/2015 2207   GLUCOSEU NEGATIVE 01/09/2015 2207   HGBUR NEGATIVE 01/09/2015 2207   BILIRUBINUR SMALL (A) 01/09/2015 2207   KETONESUR NEGATIVE 01/09/2015 2207   PROTEINUR NEGATIVE 01/09/2015 2207   UROBILINOGEN 1.0 09/24/2009 0540   NITRITE NEGATIVE 01/09/2015 2207    LEUKOCYTESUR NEGATIVE 01/09/2015 2207    Radiology Studies: Dg Chest 2 View  Result Date: 12/22/2015 CLINICAL DATA:  65 y/o  M; chest pain. EXAM: CHEST  2 VIEW COMPARISON:  01/09/2015 chest radiograph FINDINGS: Stable cardiac silhouette within normal limits given projection and technique. Emphysema and left mid lung zone bullous changes are stable. No focal consolidation. No pleural effusion. Right hilar surgical clips. No acute osseous abnormality identified IMPRESSION: No active cardiopulmonary disease.  Emphysema with bullous changes. Electronically Signed   By: Kristine Garbe [name]   On: 12/22/2015 19:11   Scheduled Meds: . amLODipine  10 mg Oral Daily  . aspirin EC  325 mg Oral Daily  . atorvastatin  5 mg Oral Daily  . cloNIDine  0.3 mg Oral BID  . cyclobenzaprine  5 mg Oral QHS  . DULoxetine  30 mg Oral Daily  . enoxaparin (LOVENOX) injection  40 mg Subcutaneous Q24H  . gabapentin  400 mg Oral TID  . insulin aspart  0-9 Units Subcutaneous Q6H  . mometasone-formoterol  2 puff Inhalation BID  . nortriptyline  25 mg Oral QHS  . pantoprazole  40 mg Oral BID AC   Continuous Infusions:   LOS: 0 days   Time spent: 20 minutes   Faye Ramsay, MD [facility] Pager [phone]  If 7PM-7AM, please contact night-coverage [email or web address] Password [email or web address] 12/23/2015, 9:18 AM

## 2015-12-23 NOTE — Interval H&P Note (Signed)
History and Physical Interval Note:  12/23/2015 4:23 PM  Jonathon [name]  has presented today for surgery, with the diagnosis of n stemi  The various methods of treatment have been discussed with the patient and family. After consideration of risks, benefits and other options for treatment, the patient has consented to  Procedure(s): Left Heart Cath and Coronary Angiography ([name]) and possible PCI as a surgical intervention .  The patient's history has been reviewed, patient examined, no change in status, stable for surgery.  I have reviewed the patient's chart and labs.  Questions were answered to the patient's satisfaction.   Cath Lab Visit (complete for each Cath Lab visit)  Clinical Evaluation Leading to the Procedure:   ACS: Yes.    Non-ACS:    Anginal Classification: CCS IV  Anti-ischemic medical therapy: Minimal Therapy (1 class of medications)  Non-Invasive Test Results: No non-invasive testing performed  Prior CABG: No previous CABG   Adrian Prows

## 2015-12-23 NOTE — H&P (View-Only) (Signed)
Subjective:  No further chest pain.  Objective:  Vital Signs in the last 24 hours: Temp:  [97.7 F (36.5 C)-98.5 F (36.9 C)] 98.4 F (36.9 C) (12/06 0741) Pulse Rate:  [71-100] 71 (12/06 0741) Resp:  [12-23] 18 (12/06 0741) BP: (122-159)/(54-88) 133/72 (12/06 0741) SpO2:  [89 %-97 %] 92 % (12/06 0741) Weight:  [97.8 kg (215 lb 8 oz)] 97.8 kg (215 lb 8 oz) (12/05 2203)  Intake/Output from previous day: 12/05 0701 - 12/06 0700 In: 500 [IV Piggyback:500] Out: -   Physical Exam:  General appearance: alert, cooperative, appears stated age, no distress and mildly obese Lungs: clear to auscultation bilaterally and barrel shaped Chest wall: no tenderness Heart: regular rate and rhythm, S1, S2 normal, no murmur, click, rub or gallop Abdomen: soft, non-tender; bowel sounds normal; no masses,  no organomegaly and Obese Extremities: extremities normal, atraumatic, no cyanosis or edema Pulses: 2+ and symmetric Neurologic: Grossly normal Lab Results: BMP  Recent Labs  01/09/15 1930 12/22/15 1804  NA 137 136  K 3.3* 3.4*  CL 104 102  CO2 24 21*  GLUCOSE 145* 125*  BUN 13 10  CREATININE 1.11 1.24  CALCIUM 8.6* 8.7*  GFRNONAA >60 59*  GFRAA >60 >60    CBC  Recent Labs Lab 12/22/15 1804  WBC 13.4*  RBC 5.21  HGB 14.8  HCT 44.0  PLT 254  MCV 84.5  MCH 28.4  MCHC 33.6  RDW 15.2    HEMOGLOBIN A1C Lab Results  Component Value Date   HGBA1C 6.7 (H) 09/28/2013   MPG 146 (H) 09/28/2013    Cardiac Panel (last 3 results)  Recent Labs  12/22/15 2303 12/23/15 0500  TROPONINI 2.89* 3.71*   Recent Labs  01/09/15 1930  PROT 6.1*  ALBUMIN 3.0*  AST 12*  ALT 13*  ALKPHOS 88  BILITOT 0.5    Imaging: Imaging results have been reviewed  Cardiac Studies:  EKG 12/22/2015: Normal sinus rhythm at a rate of 85 bpm, left axis deviation. No evidence of ischemia. Baseline artifact.   Echocardiogram 01/30/2011: Normal left ventricular systolic function, EF 40-35%  without wall motion abnormality. Mild diastolic dysfunction, mild left atrial enlargement.   Assessment/Plan:  1. NSTEMI 2. Emphysema with history of pneumothorax in the past, left upper lobe lobectomy, continued tobacco use disorder and on home oxygen. 3. Hyperlipidemia 4. Hypertension 5. Diabetes mellitus type 2 controlled without hypoglycemia without insulin.  Recommendation: I have discussed the patient regarding NSTEMI, I have recommended proceeding with coronary angiography. I will start the patient on IV heparin, scheduled for catheterization this afternoon. Check lipid panel  Discussed risks, benefits and alternatives of angiogram including but not limited to <1% risk of death, stroke, MI, need for urgent surgical revascularization, renal failure, but not limited to thest. patient is willing to proceed.   Adrian Prows, [name] 12/23/2015, 9:26 AM [facility] Pager: [phone] Office: [phone] If no answer: [phone]

## 2015-12-24 ENCOUNTER — Encounter (HOSPITAL_COMMUNITY)
Admission: EM | Disposition: A | Discharge status: Home / Self Care | Payer: Self-pay | Source: Home / Self Care | Attending: Internal Medicine

## 2015-12-24 ENCOUNTER — Encounter (HOSPITAL_COMMUNITY): Payer: Self-pay | Admitting: Cardiology

## 2015-12-24 DIAGNOSIS — Z7982 Long term (current) use of aspirin: Secondary | ICD-10-CM | POA: Diagnosis not present

## 2015-12-24 DIAGNOSIS — E876 Hypokalemia: Secondary | ICD-10-CM | POA: Diagnosis present

## 2015-12-24 DIAGNOSIS — I2510 Atherosclerotic heart disease of native coronary artery without angina pectoris: Secondary | ICD-10-CM | POA: Diagnosis present

## 2015-12-24 DIAGNOSIS — E785 Hyperlipidemia, unspecified: Secondary | ICD-10-CM | POA: Diagnosis present

## 2015-12-24 DIAGNOSIS — Z7984 Long term (current) use of oral hypoglycemic drugs: Secondary | ICD-10-CM | POA: Diagnosis not present

## 2015-12-24 DIAGNOSIS — I110 Hypertensive heart disease with heart failure: Secondary | ICD-10-CM | POA: Diagnosis present

## 2015-12-24 DIAGNOSIS — I10 Essential (primary) hypertension: Secondary | ICD-10-CM | POA: Diagnosis not present

## 2015-12-24 DIAGNOSIS — J439 Emphysema, unspecified: Secondary | ICD-10-CM | POA: Diagnosis present

## 2015-12-24 DIAGNOSIS — I214 Non-ST elevation (NSTEMI) myocardial infarction: Secondary | ICD-10-CM | POA: Diagnosis present

## 2015-12-24 DIAGNOSIS — R0789 Other chest pain: Secondary | ICD-10-CM | POA: Diagnosis not present

## 2015-12-24 DIAGNOSIS — R001 Bradycardia, unspecified: Secondary | ICD-10-CM | POA: Diagnosis present

## 2015-12-24 DIAGNOSIS — I509 Heart failure, unspecified: Secondary | ICD-10-CM | POA: Diagnosis present

## 2015-12-24 DIAGNOSIS — Z8673 Personal history of transient ischemic attack (TIA), and cerebral infarction without residual deficits: Secondary | ICD-10-CM | POA: Diagnosis not present

## 2015-12-24 DIAGNOSIS — E119 Type 2 diabetes mellitus without complications: Secondary | ICD-10-CM | POA: Diagnosis present

## 2015-12-24 DIAGNOSIS — Z9981 Dependence on supplemental oxygen: Secondary | ICD-10-CM | POA: Diagnosis not present

## 2015-12-24 DIAGNOSIS — R079 Chest pain, unspecified: Secondary | ICD-10-CM | POA: Diagnosis present

## 2015-12-24 HISTORY — PX: CORONARY STENT PLACEMENT: SHX1402

## 2015-12-24 HISTORY — PX: CARDIAC CATHETERIZATION: SHX172

## 2015-12-24 LAB — GLUCOSE, CAPILLARY
Glucose-Capillary: 120 mg/dL — ABNORMAL HIGH (ref 65–99)
Glucose-Capillary: 138 mg/dL — ABNORMAL HIGH (ref 65–99)
Glucose-Capillary: 156 mg/dL — ABNORMAL HIGH (ref 65–99)
Glucose-Capillary: 162 mg/dL — ABNORMAL HIGH (ref 65–99)
Glucose-Capillary: 174 mg/dL — ABNORMAL HIGH (ref 65–99)

## 2015-12-24 LAB — CBC
HCT: 39.8 % (ref 39.0–52.0)
Hemoglobin: 14.3 g/dL (ref 13.0–17.0)
MCH: 30.4 pg (ref 26.0–34.0)
MCHC: 35.9 g/dL (ref 30.0–36.0)
MCV: 84.5 fL (ref 78.0–100.0)
Platelets: 212 10*3/uL (ref 150–400)
RBC: 4.71 MIL/uL (ref 4.22–5.81)
RDW: 15.2 % (ref 11.5–15.5)
WBC: 8.7 10*3/uL (ref 4.0–10.5)

## 2015-12-24 LAB — BASIC METABOLIC PANEL
Anion gap: 7 (ref 5–15)
BUN: 10 mg/dL (ref 6–20)
CO2: 24 mmol/L (ref 22–32)
Calcium: 8.6 mg/dL — ABNORMAL LOW (ref 8.9–10.3)
Chloride: 104 mmol/L (ref 101–111)
Creatinine, Ser: 0.98 mg/dL (ref 0.61–1.24)
GFR calc Af Amer: >60 mL/min (ref >60)
GFR calc non Af Amer: >60 mL/min (ref >60)
Glucose, Bld: 124 mg/dL — ABNORMAL HIGH (ref 65–99)
Potassium: 3.3 mmol/L — ABNORMAL LOW (ref 3.5–5.1)
Sodium: 135 mmol/L (ref 135–145)

## 2015-12-24 LAB — POCT ACTIVATED CLOTTING TIME: Activated Clotting Time: 290 seconds

## 2015-12-24 SURGERY — CORONARY STENT INTERVENTION

## 2015-12-24 MED ORDER — FENTANYL CITRATE (PF) 100 MCG/2ML IJ SOLN: 25.0000 ug | INTRAMUSCULAR | Status: DC | PRN

## 2015-12-24 MED ORDER — SODIUM CHLORIDE 0.9% FLUSH: 3.0000 mL | Freq: Two times a day (BID) | INTRAVENOUS | Status: DC

## 2015-12-24 MED ORDER — ANGIOPLASTY BOOK
Freq: Once | Status: AC
  Administered 2015-12-25
  Filled 2015-12-24: qty 1

## 2015-12-24 MED ORDER — HYDRALAZINE HCL 20 MG/ML IJ SOLN: 5.0000 mg | INTRAMUSCULAR | Status: AC | PRN

## 2015-12-24 MED ORDER — FENTANYL CITRATE (PF) 100 MCG/2ML IJ SOLN
INTRAMUSCULAR | Status: AC
  Filled 2015-12-24: qty 2

## 2015-12-24 MED ORDER — LABETALOL HCL 5 MG/ML IV SOLN: 10.0000 mg | INTRAVENOUS | Status: AC | PRN

## 2015-12-24 MED ORDER — HEPARIN (PORCINE) IN NACL 2-0.9 UNIT/ML-% IJ SOLN
INTRAMUSCULAR | Status: DC | PRN
  Administered 2015-12-24: 1000 mL

## 2015-12-24 MED ORDER — HEPARIN SODIUM (PORCINE) 1000 UNIT/ML IJ SOLN
INTRAMUSCULAR | Status: AC
  Filled 2015-12-24: qty 1

## 2015-12-24 MED ORDER — LIDOCAINE HCL (PF) 1 % IJ SOLN
INTRAMUSCULAR | Status: AC
  Filled 2015-12-24: qty 30

## 2015-12-24 MED ORDER — POTASSIUM CHLORIDE CRYS ER 20 MEQ PO TBCR
40.0000 meq | EXTENDED_RELEASE_TABLET | Freq: Once | ORAL | Status: AC
  Administered 2015-12-24: 40 meq via ORAL

## 2015-12-24 MED ORDER — SODIUM CHLORIDE 0.9 % IV SOLN: 250.0000 mL | INTRAVENOUS | Status: DC | PRN

## 2015-12-24 MED ORDER — HEPARIN (PORCINE) IN NACL 100-0.45 UNIT/ML-% IJ SOLN
1350.0000 [IU]/h | INTRAMUSCULAR | Status: DC
  Administered 2015-12-24: 1350 [IU]/h via INTRAVENOUS
  Filled 2015-12-24: qty 250

## 2015-12-24 MED ORDER — HEART ATTACK BOUNCING BOOK
Freq: Once | Status: AC
  Administered 2015-12-25
  Filled 2015-12-24: qty 1

## 2015-12-24 MED ORDER — MIDAZOLAM HCL 2 MG/2ML IJ SOLN
INTRAMUSCULAR | Status: AC
  Filled 2015-12-24: qty 2

## 2015-12-24 MED ORDER — NITROGLYCERIN 1 MG/10 ML FOR IR/CATH LAB
INTRA_ARTERIAL | Status: AC
  Filled 2015-12-24: qty 10

## 2015-12-24 MED ORDER — ASPIRIN 81 MG PO CHEW: 81.0000 mg | CHEWABLE_TABLET | ORAL | Status: AC

## 2015-12-24 MED ORDER — LIDOCAINE HCL (PF) 1 % IJ SOLN
INTRAMUSCULAR | Status: DC | PRN
  Administered 2015-12-24: 10 mL via INTRA_ARTERIAL

## 2015-12-24 MED ORDER — HEPARIN (PORCINE) IN NACL 2-0.9 UNIT/ML-% IJ SOLN
INTRAMUSCULAR | Status: AC
  Filled 2015-12-24: qty 1000

## 2015-12-24 MED ORDER — NICOTINE 21 MG/24HR TD PT24
21.0000 mg | MEDICATED_PATCH | Freq: Every day | TRANSDERMAL | Status: DC
  Administered 2015-12-24: 21 mg via TRANSDERMAL
  Filled 2015-12-24 (×2): qty 1

## 2015-12-24 MED ORDER — LIDOCAINE HCL (PF) 1 % IJ SOLN
INTRAMUSCULAR | Status: DC | PRN
  Administered 2015-12-24: 2 mL

## 2015-12-24 MED ORDER — VERAPAMIL HCL 2.5 MG/ML IV SOLN
INTRAVENOUS | Status: AC
  Filled 2015-12-24: qty 2

## 2015-12-24 MED ORDER — SODIUM CHLORIDE 0.9% FLUSH: 3.0000 mL | INTRAVENOUS | Status: DC | PRN

## 2015-12-24 MED ORDER — MIDAZOLAM HCL 2 MG/2ML IJ SOLN
INTRAMUSCULAR | Status: DC | PRN
  Administered 2015-12-24: 2 mg via INTRAVENOUS

## 2015-12-24 MED ORDER — IOPAMIDOL (ISOVUE-370) INJECTION 76%
INTRAVENOUS | Status: DC | PRN
  Administered 2015-12-24: 55 mL via INTRA_ARTERIAL

## 2015-12-24 MED ORDER — SODIUM CHLORIDE 0.9 % WEIGHT BASED INFUSION: 1.0000 mL/kg/h | INTRAVENOUS | Status: DC

## 2015-12-24 MED ORDER — HEPARIN BOLUS VIA INFUSION
4000.0000 [IU] | Freq: Once | INTRAVENOUS | Status: AC
  Administered 2015-12-24: 4000 [IU] via INTRAVENOUS
  Filled 2015-12-24: qty 4000

## 2015-12-24 MED ORDER — HEPARIN SODIUM (PORCINE) 1000 UNIT/ML IJ SOLN
INTRAMUSCULAR | Status: DC | PRN
  Administered 2015-12-24: 8000 [IU] via INTRAVENOUS

## 2015-12-24 MED ORDER — NITROGLYCERIN IN D5W 200-5 MCG/ML-% IV SOLN
20.0000 ug/min | INTRAVENOUS | Status: DC
  Administered 2015-12-24: 20 ug/min via INTRAVENOUS
  Filled 2015-12-24: qty 250

## 2015-12-24 MED ORDER — SODIUM CHLORIDE 0.9% FLUSH
3.0000 mL | Freq: Two times a day (BID) | INTRAVENOUS | Status: DC
  Administered 2015-12-24: 3 mL via INTRAVENOUS

## 2015-12-24 MED ORDER — SODIUM CHLORIDE 0.9 % WEIGHT BASED INFUSION
3.0000 mL/kg/h | INTRAVENOUS | Status: AC
  Administered 2015-12-24: 3 mL/kg/h via INTRAVENOUS

## 2015-12-24 MED ORDER — NITROGLYCERIN 1 MG/10 ML FOR IR/CATH LAB
INTRA_ARTERIAL | Status: DC | PRN
  Administered 2015-12-24: 200 ug via INTRACORONARY

## 2015-12-24 MED ORDER — SODIUM CHLORIDE 0.9 % WEIGHT BASED INFUSION
1.0000 mL/kg/h | INTRAVENOUS | Status: AC
  Administered 2015-12-24: 18:00:00 1 mL/kg/h via INTRAVENOUS

## 2015-12-24 MED ORDER — FENTANYL CITRATE (PF) 100 MCG/2ML IJ SOLN
INTRAMUSCULAR | Status: DC | PRN
  Administered 2015-12-24: 25 ug via INTRAVENOUS
  Administered 2015-12-24: 50 ug via INTRAVENOUS

## 2015-12-24 MED ORDER — IOPAMIDOL (ISOVUE-370) INJECTION 76%
INTRAVENOUS | Status: AC
  Filled 2015-12-24: qty 125

## 2015-12-24 SURGICAL SUPPLY — 12 items
CATH HEARTRAIL IKARI 6F IR1.0 (CATHETERS) ×1 IMPLANT
DEVICE RAD COMP TR BAND LRG (VASCULAR PRODUCTS) ×1 IMPLANT
GLIDESHEATH SLEND A-KIT 6F 20G (SHEATH) ×1 IMPLANT
GUIDEWIRE INQWIRE 1.5J.035X260 (WIRE) IMPLANT
INQWIRE 1.5J .035X260CM (WIRE) ×2
KIT ENCORE 26 ADVANTAGE (KITS) ×2 IMPLANT
KIT HEART LEFT (KITS) ×2 IMPLANT
PACK CARDIAC CATHETERIZATION (CUSTOM PROCEDURE TRAY) ×2 IMPLANT
STENT RESOLUTE ONYX 4.0X12 (Permanent Stent) ×1 IMPLANT
TRANSDUCER W/STOPCOCK (MISCELLANEOUS) ×2 IMPLANT
TUBING CIL FLEX 10 FLL-RA (TUBING) ×2 IMPLANT
WIRE RUNTHROUGH .014X180CM (WIRE) ×1 IMPLANT

## 2015-12-24 NOTE — Progress Notes (Signed)
Nitro and heparin gtts started.  CCMD called to notify pt had 6beat run of VT.  Currently NSB in 39s.  RN in room actively monitoring pt who is otherwise asymptomatic.

## 2015-12-24 NOTE — Progress Notes (Addendum)
Patient ID: Jonathon [name], male   DOB: [birth year], 65 y.o.   MRN: [id number]    PROGRESS NOTE    Jonathon [name]  [id number]:[id number] DOB: Feb 21, 1950 DOA: 12/22/2015  PCP: Phineas Inches, MD   Brief Narrative:  65 y.o. male with a past medical history significant for history of stroke, active smoking, COPD not on home O2, NIDDM, and HTN who presented with chest pain described as dull, central chest pressure, all across his chest "like someone was sitting on it", associated with fatigue and SOB. He had a LHC in 2015 that showed no significant disease.     ED course: - Afebrile, heart rate 90-100, respirations and pulse ox normal, BP 145/76 - Initial ECG showed old LAFB without changes and troponin was negative. - Na 136, K 3.4, Cr 1.24 (baseline 1.1), WBC 13.4, Hgb 14.8 - CXR showed emphysema without pneumonia - TRH was asked to admit for observation, serial troponins and risk stratification.  Assessment & Plan:   Principal Problem:   Chest pain, atypical, recurrent  - denies chest pain this am, troponins however trending up: 2.89 --> 3.71 --> 2.83 --> 2.25 - proceed with PCI to the ulcerated distal circumflex coronary artery this afternoon per Dr. [name]  - keep NPO for now, keep on tele  - ok to advance diet post PCI - heparin drip started   Active Problems:   Bradycardia - noted this AM with HR in 40's - pt on Bisoprolol, may need to be held if cardiology agrees     Essential hypertension, benign - reasonable inpatient control     Hypokalemia - mild, supplement, BMP in AM    History of Left Stroke, hemorrhagic    COPD GOLD II  - not in exacerbation at this time     Type 2 diabetes mellitus without complication, without long-term current use of insulin with complications of neuropathies  - continue on SSI for now while pt is NPO - continue Neurontin   DVT prophylaxis: Heparin drip Code Status: Full  Family Communication: Patient and wife at bedside  Disposition  Plan: to be determined   Consultants:   Cardiology   Procedures:   PCI today 12/07  Antimicrobials:   None  Subjective: Still with chest pain this AM, 4/10.   Objective: Vitals:   12/24/15 0636 12/24/15 1009 12/24/15 1137 12/24/15 1220  BP: (!) 144/67  127/78 125/73  Pulse: (!) 58  (!) 51 (!) 47  Resp: '18  18 18  '$ Temp: 98 F (36.7 C)     TempSrc: Oral     SpO2: 95%  96% 99%  Weight:  97.8 kg (215 lb 9.8 oz)    Height:        Intake/Output Summary (Last 24 hours) at 12/24/15 1222 Last data filed at 12/24/15 1027  Gross per 24 hour  Intake                0 ml  Output                0 ml  Net                0 ml   Filed Weights   12/22/15 2203 12/24/15 1009  Weight: 97.8 kg (215 lb 8 oz) 97.8 kg (215 lb 9.8 oz)    Examination:  General exam: Appears calm and comfortable  Respiratory system: Clear to auscultation. Respiratory effort normal. Cardiovascular system: S1 & S2 heard, RRR. No JVD, rubs, gallops or  clicks. No pedal edema. Gastrointestinal system: Abdomen is nondistended, soft and nontender. No organomegaly or masses felt.   Data Reviewed: I have personally reviewed following labs and imaging studies  CBC:  Recent Labs Lab 12/22/15 1804 12/24/15 0246  WBC 13.4* 8.7  HGB 14.8 14.3  HCT 44.0 39.8  MCV 84.5 84.5  PLT 254 026   Basic Metabolic Panel:  Recent Labs Lab 12/22/15 1804 12/24/15 0246  NA 136 135  K 3.4* 3.3*  CL 102 104  CO2 21* 24  GLUCOSE 125* 124*  BUN 10 10  CREATININE 1.24 0.98  CALCIUM 8.7* 8.6*   Cardiac Enzymes:  Recent Labs Lab 12/22/15 2303 12/23/15 0500 12/23/15 0931 12/23/15 1530 12/23/15 2103  TROPONINI 2.89* 3.71* 3.40* 2.83* 2.25*   CBG:  Recent Labs Lab 12/23/15 0626 12/23/15 1111 12/24/15 0053 12/24/15 0632 12/24/15 1109  GLUCAP 119* 117* 174* 120* 138*   Urine analysis:    Component Value Date/Time   COLORURINE AMBER (A) 01/09/2015 2207   APPEARANCEUR CLEAR 01/09/2015 2207   LABSPEC  1.035 (H) 01/09/2015 2207   PHURINE 6.0 01/09/2015 2207   GLUCOSEU NEGATIVE 01/09/2015 2207   HGBUR NEGATIVE 01/09/2015 2207   BILIRUBINUR SMALL (A) 01/09/2015 2207   KETONESUR NEGATIVE 01/09/2015 2207   PROTEINUR NEGATIVE 01/09/2015 2207   UROBILINOGEN 1.0 09/24/2009 0540   NITRITE NEGATIVE 01/09/2015 2207   LEUKOCYTESUR NEGATIVE 01/09/2015 2207    Radiology Studies: Dg Chest 2 View  Result Date: 12/22/2015 CLINICAL DATA:  65 y/o  M; chest pain. EXAM: CHEST  2 VIEW COMPARISON:  01/09/2015 chest radiograph FINDINGS: Stable cardiac silhouette within normal limits given projection and technique. Emphysema and left mid lung zone bullous changes are stable. No focal consolidation. No pleural effusion. Right hilar surgical clips. No acute osseous abnormality identified IMPRESSION: No active cardiopulmonary disease.  Emphysema with bullous changes. Electronically Signed   By: Kristine Garbe [name]   On: 12/22/2015 19:11   Scheduled Meds: . amLODipine  10 mg Oral Daily  . aspirin EC  81 mg Oral Daily  . atorvastatin  5 mg Oral Daily  . bisoprolol  5 mg Oral Daily  . cloNIDine  0.3 mg Oral BID  . cyclobenzaprine  5 mg Oral QHS  . DULoxetine  30 mg Oral Daily  . gabapentin  400 mg Oral TID  . insulin aspart  0-9 Units Subcutaneous Q6H  . mometasone-formoterol  2 puff Inhalation BID  . nortriptyline  25 mg Oral QHS  . pantoprazole  40 mg Oral BID AC  . sodium chloride flush  3 mL Intravenous Q12H  . sodium chloride flush  3 mL Intravenous Q12H  . ticagrelor  90 mg Oral BID   Continuous Infusions: . sodium chloride 1 mL/kg/hr (12/24/15 1211)  . heparin 1,350 Units/hr (12/24/15 1218)  . nitroGLYCERIN 20 mcg/min (12/24/15 1210)     LOS: 0 days   Time spent: 20 minutes   Faye Ramsay, MD [facility] Pager [phone]  If 7PM-7AM, please contact night-coverage [email or web address] Password [email or web address] 12/24/2015, 12:22 PM

## 2015-12-24 NOTE — Progress Notes (Signed)
[place] for heparin Indication: CP/SOB for PCI this afternoon  No Known Allergies  Patient Measurements: Height: 6' (182.9 cm) Weight: 215 lb 9.8 oz (97.8 kg) IBW/kg (Calculated) : 77.6 Heparin Dosing Weight: 97.2 kg  Vital Signs: Temp: 98 F (36.7 C) (12/07 0636) Temp Source: Oral (12/07 0636) BP: 127/78 (12/07 1137) Pulse Rate: 51 (12/07 1137)  Labs:  Recent Labs  12/22/15 1804  12/23/15 0931 12/23/15 1530 12/23/15 2103 12/24/15 0246  HGB 14.8  --   --   --   --  14.3  HCT 44.0  --   --   --   --  39.8  PLT 254  --   --   --   --  212  LABPROT  --   --   --  14.0  --   --   INR  --   --   --  1.07  --   --   HEPARINUNFRC  --   --   --  0.38  --   --   CREATININE 1.24  --   --   --   --  0.98  TROPONINI  --   < > 3.40* 2.83* 2.25*  --   < > = values in this interval not displayed.  Estimated Creatinine Clearance: 89.9 mL/min (by C-G formula based on SCr of 0.98 mg/dL).  Assessment: 65 yo M who was previously on heparin precath 12/6.  Yesterday he was given heparin 4000 unit bolus  and 1300 unit/hr with HL 0.38. heparin stopped s/p cath 12/6 and started on brillinta and BB. Now pharmacy re-consulted to restart heparin for recurrence of CP, to proceed with PCI this afternoon  TWB 97.8 kg, creat 1.24, CBC WNL.    Goal of Therapy:  Heparin level 0.3-0.7 units/ml Monitor platelets by anticoagulation protocol: Yes   Plan: heparin 4000 unit bolus, heparin drip at 1350 units/hr No heparin levels planned as pt to PCI this afternoon F/u p PCI  Eudelia Bunch, Pharm.D. [phone] 12/24/2015 11:55 AM

## 2015-12-24 NOTE — Progress Notes (Signed)
Dr. [name] notified that pt reports SOB. Received VO to start heparin drip per pharmacy and nitroglycerin 64mg/min.

## 2015-12-24 NOTE — Progress Notes (Signed)
Patient's wife stated that he was feeling weird. Patient stated that he was having chest pain "in and out". When asked by the charge nurse, he said that it was not chest pain but was having trouble breathing.  12 lead EKG taken along with vitals. All vital signs were normal. Asked him if was feeling anxious and if he needed O2 for comfort. Said that he did not want it, cannula in room if needed. After vitals were complete, stated that he felt fine. Advised patient and his wife to let us know if he started feeling weird again. Call light within reach of patient and wife. Will continue to monitor.

## 2015-12-24 NOTE — Progress Notes (Signed)
He has recurrence of chest pain. Hence we'll go ahead and proceed with PCI to the ulcerated distal circumflex coronary artery this afternoon.

## 2015-12-24 NOTE — Interval H&P Note (Signed)
History and Physical Interval Note:  12/24/2015 4:44 PM  Jonathon [name]  has presented today for surgery, with the diagnosis of cad  The various methods of treatment have been discussed with the patient and family. After consideration of risks, benefits and other options for treatment, the patient has consented to  Procedure(s): Coronary Stent Intervention ([name]) as a surgical intervention .  The patient's history has been reviewed, patient examined, no change in status, stable for surgery.  I have reviewed the patient's chart and labs.  Questions were answered to the patient's satisfaction.   Cath Lab Visit (complete for each Cath Lab visit)  Clinical Evaluation Leading to the Procedure:   ACS: Yes.    Non-ACS:    Anginal Classification: CCS IV  Anti-ischemic medical therapy: Maximal Therapy (2 or more classes of medications)  Non-Invasive Test Results: No non-invasive testing performed  Prior CABG: No previous CABG        Adrian Prows

## 2015-12-25 ENCOUNTER — Encounter (HOSPITAL_COMMUNITY): Payer: Self-pay | Admitting: Cardiology

## 2015-12-25 DIAGNOSIS — R0789 Other chest pain: Secondary | ICD-10-CM

## 2015-12-25 DIAGNOSIS — I10 Essential (primary) hypertension: Secondary | ICD-10-CM

## 2015-12-25 LAB — BASIC METABOLIC PANEL
Anion gap: 10 (ref 5–15)
BUN: 11 mg/dL (ref 6–20)
CO2: 23 mmol/L (ref 22–32)
Calcium: 8.9 mg/dL (ref 8.9–10.3)
Chloride: 101 mmol/L (ref 101–111)
Creatinine, Ser: 0.86 mg/dL (ref 0.61–1.24)
GFR calc Af Amer: >60 mL/min (ref >60)
GFR calc non Af Amer: >60 mL/min (ref >60)
Glucose, Bld: 130 mg/dL — ABNORMAL HIGH (ref 65–99)
Potassium: 3.2 mmol/L — ABNORMAL LOW (ref 3.5–5.1)
Sodium: 134 mmol/L — ABNORMAL LOW (ref 135–145)

## 2015-12-25 LAB — CBC
HCT: 41.2 % (ref 39.0–52.0)
Hemoglobin: 14 g/dL (ref 13.0–17.0)
MCH: 28.5 pg (ref 26.0–34.0)
MCHC: 34 g/dL (ref 30.0–36.0)
MCV: 83.7 fL (ref 78.0–100.0)
Platelets: 223 10*3/uL (ref 150–400)
RBC: 4.92 MIL/uL (ref 4.22–5.81)
RDW: 14.9 % (ref 11.5–15.5)
WBC: 10.1 10*3/uL (ref 4.0–10.5)

## 2015-12-25 LAB — GLUCOSE, CAPILLARY
Glucose-Capillary: 101 mg/dL — ABNORMAL HIGH (ref 65–99)
Glucose-Capillary: 156 mg/dL — ABNORMAL HIGH (ref 65–99)
Glucose-Capillary: 83 mg/dL (ref 65–99)

## 2015-12-25 MED ORDER — ATORVASTATIN CALCIUM 80 MG PO TABS: 80.0000 mg | ORAL_TABLET | ORAL | 0 refills | Status: DC

## 2015-12-25 MED ORDER — ATORVASTATIN CALCIUM 80 MG PO TABS: 80.0000 mg | ORAL_TABLET | Freq: Every day | ORAL | Status: DC

## 2015-12-25 MED ORDER — BISOPROLOL FUMARATE 5 MG PO TABS: 5.0000 mg | ORAL_TABLET | Freq: Every day | ORAL | 0 refills | Status: DC

## 2015-12-25 MED ORDER — NICOTINE 21 MG/24HR TD PT24: 21.0000 mg | MEDICATED_PATCH | Freq: Every day | TRANSDERMAL | 0 refills | Status: DC

## 2015-12-25 MED ORDER — TICAGRELOR 90 MG PO TABS: 90.0000 mg | ORAL_TABLET | Freq: Two times a day (BID) | ORAL | 0 refills | Status: DC

## 2015-12-25 MED ORDER — POTASSIUM CHLORIDE CRYS ER 20 MEQ PO TBCR
40.0000 meq | EXTENDED_RELEASE_TABLET | Freq: Once | ORAL | Status: AC
  Administered 2015-12-25: 13:00:00 40 meq via ORAL
  Filled 2015-12-25: qty 2

## 2015-12-25 NOTE — Discharge Instructions (Signed)

## 2015-12-25 NOTE — Progress Notes (Signed)
CARDIAC REHAB PHASE I   PRE:  Rate/Rhythm: 70 SR    BP: sitting 170/97    SaO2:   MODE:  Ambulation: 250 ft   POST:  Rate/Rhythm: 80 SR    BP: sitting 158/101      SaO2:   Pt c/o fatigue with slow pace. Sts he thinks it is due to being in the hospital for days. He is not very active at home. BP significantly elevated this am. Ed completed with pt and wife. Good reception. Thinking about quitting smoking. Gave resources and fake cigarette. Understands importance of Brilinta. Will send referral to [place]. He needs more ex/activity at home. [place], ACSM 12/25/2015 9:32 AM

## 2015-12-25 NOTE — Progress Notes (Signed)
S/W LINDA @ [facility] # [phone]   COVER- YES  CO-PAY- 40 % OF COAST ( patient in coverage gap ) Q/L2 PER DAY  TIER-3 DRUG  PRIOR APPROVAL-NO  PHARMACY: [facility]

## 2015-12-25 NOTE — Progress Notes (Signed)
D/C instructions reviewed w/ pt and wife. Lengthy teaching provided about MI, PCI, smoking cessation, new meds, activity, radial site care.  Appropriate questions asked and answered. Understands risk of MI if pt misses doses of Brilinta/aspirin.  Pt not sure he wants to quit smoking but knows he needs to.  Wife smokes and isn't ready to quit but is willing to not smoke in front of him.  Benefits of quitting and tips for success discussed. Stressed importance of removing nicotine patch 2 hours prior to smoking if pt feels he must smoke.  New meds being delivered to home by pharmacy per pt's wife.  To front door per w/c with volunteer.

## 2015-12-25 NOTE — Discharge Summary (Signed)
Physician Discharge Summary  Jonathon [name] KGU:[id number] DOB: 08-09-1950 DOA: 12/22/2015  PCP: Phineas Inches, MD  Admit date: 12/22/2015 Discharge date: 12/25/2015  Recommendations for Outpatient Follow-up:  1. Pt will need to follow up with PCP in 1-2 weeks post discharge 2. Please obtain BMP to evaluate electrolytes and kidney function, potassium level 3. Please note that K was supplemented prior to discharge  4. Pt to see Dr. [name] for follow up, please see details below on appointment time and date  Discharge Diagnoses:  Principal Problem:   Chest pain Active Problems:   Essential hypertension, benign  Discharge Condition: Stable  Diet recommendation: Heart healthy diet discussed in details   Brief Narrative:  65 y.o.malewith a past medical history significant for history of stroke, active smoking, COPD not on home O2, NIDDM, and HTNwho presented with chest pain described as dull, central chest pressure, all across his chest "like someone was sitting on it", associated with fatigue and SOB. He had a LHC in 2015 that showed no significant disease.    ED course: - Afebrile, heart rate 90-100, respirations and pulse ox normal, BP 145/76 - Initial ECG showed old LAFB without changesand troponin was negative. - Na 136, K 3.4, Cr 1.24(baseline 1.1), WBC 13.4, Hgb 14.8 - CXR showed emphysema without pneumonia - TRH was asked to admit for observation, serial troponins and risk stratification.  Assessment & Plan:   Principal Problem:   Chest pain, atypical, recurrent  - secondary to NSTEMI - Coronary angiogram 12/23/2015 had revealed Ost LAD lesion, 20 %stenosed. Dist LAD lesion, 30 %stenosed. Distal RCA which is very large had an ulcerated 50% stenosis and 1st RPLB lesion, 40 %stenosed. Normal LVEF. - Successful PTCA and stenting of the distal RCA with 4.0 x 12 mm onyx DES, stenosis reduced from 80% to 0% with maintenance of TIMI-3 flow - per cardiology, continue  high dose statin and Brilinta   Active Problems:   Bradycardia - resolved and HR in 70 - 90's - resume Bisoprolol on discharge     Essential hypertension, benign - reasonable inpatient control     Hypokalemia - supplemented prior to discharge     History of Left Stroke, hemorrhagic    COPD GOLD II  - not in exacerbation at this time     Type 2 diabetes mellitus without complication, without long-term current use of insulin with complications of neuropathies  - continue home medical regimen  - continue Neurontin   Code Status: Full  Family Communication: Patient and wife at bedside  Disposition Plan: home   Consultants:   Cardiology   Procedures:   PCI 12/07  Antimicrobials:   None  Procedures/Studies: Dg Chest 2 View  Result Date: 12/22/2015 CLINICAL DATA:  65 y/o  M; chest pain. EXAM: CHEST  2 VIEW COMPARISON:  01/09/2015 chest radiograph FINDINGS: Stable cardiac silhouette within normal limits given projection and technique. Emphysema and left mid lung zone bullous changes are stable. No focal consolidation. No pleural effusion. Right hilar surgical clips. No acute osseous abnormality identified IMPRESSION: No active cardiopulmonary disease.  Emphysema with bullous changes. Electronically Signed   By: Kristine Garbe [name]   On: 12/22/2015 19:11   Discharge Exam: Vitals:   12/25/15 0431 12/25/15 0809  BP: 137/80 (!) 170/97  Pulse: (!) 59 77  Resp: 20 (!) 26  Temp: 97.3 F (36.3 C) 97.7 F (36.5 C)   Vitals:   12/24/15 2202 12/25/15 0431 12/25/15 0809 12/25/15 0929  BP: (!) 152/75 137/80 Marland Kitchen)  170/97   Pulse:  (!) 59 77   Resp:  20 (!) 26   Temp:  97.3 F (36.3 C) 97.7 F (36.5 C)   TempSrc:  Axillary Oral   SpO2:  97% 97% 98%  Weight:  99.2 kg (218 lb 11.1 oz)    Height:        General: Pt is alert, follows commands appropriately, not in acute distress Cardiovascular: Regular rate and rhythm, no rubs, no gallops Respiratory: Clear  to auscultation bilaterally, no wheezing, no crackles, no rhonchi Abdominal: Soft, non tender, non distended, bowel sounds +, no guarding   Discharge Instructions  Discharge Instructions    Amb Referral to Cardiac Rehabilitation    Complete by:  As directed    Diagnosis:   Coronary Stents NSTEMI PTCA     Diet - low sodium heart healthy    Complete by:  As directed    Increase activity slowly    Complete by:  As directed        Medication List    STOP taking these medications   ibuprofen 200 MG tablet Commonly known as:  ADVIL,MOTRIN     TAKE these medications   albuterol (2.5 MG/3ML) 0.083% nebulizer solution Commonly known as:  PROVENTIL Take 2.5 mg by nebulization every 4 (four) hours as needed for wheezing or shortness of breath.   Albuterol Sulfate 108 (90 Base) MCG/ACT Aepb Commonly known as:  PROAIR RESPICLICK Inhale 2 puffs into the lungs 4 (four) times daily as needed.   amLODipine 10 MG tablet Commonly known as:  NORVASC Take 10 mg by mouth every morning.   aspirin EC 325 MG tablet Take 325 mg by mouth every morning.   atorvastatin 80 MG tablet Commonly known as:  LIPITOR Take 1 tablet (80 mg total) by mouth every morning. Take 1/2 tablet by mouth every morning What changed:  medication strength  how much to take   bisoprolol 5 MG tablet Commonly known as:  ZEBETA Take 1 tablet (5 mg total) by mouth daily. Start taking on:  12/26/2015   budesonide-formoterol 160-4.5 MCG/ACT inhaler Commonly known as:  SYMBICORT Inhale 2 puffs into the lungs 2 (two) times daily.   cloNIDine 0.3 MG tablet Commonly known as:  CATAPRES Take 0.3 mg by mouth 2 (two) times daily.   cyclobenzaprine 5 MG tablet Commonly known as:  FLEXERIL Take 5 mg by mouth at bedtime.   DULoxetine 30 MG capsule Commonly known as:  CYMBALTA Take 30 mg by mouth every morning.   gabapentin 400 MG capsule Commonly known as:  NEURONTIN Take 400 mg by mouth 3 (three) times  daily.   irbesartan 150 MG tablet Commonly known as:  AVAPRO TAKE ONE (1) TABLET BY MOUTH EVERY DAY What changed:  See the new instructions.   metFORMIN 500 MG tablet Commonly known as:  GLUCOPHAGE Take 500 mg by mouth daily with breakfast.   nicotine 21 mg/24hr patch Commonly known as:  NICODERM CQ - dosed in mg/24 hours Place 1 patch (21 mg total) onto the skin daily.   nitroGLYCERIN 0.4 MG SL tablet Commonly known as:  NITROSTAT Place 0.4 mg under the tongue every 5 (five) minutes as needed for chest pain.   nortriptyline 25 MG capsule Commonly known as:  PAMELOR Take 25 mg by mouth at bedtime.   omeprazole 40 MG capsule Commonly known as:  PRILOSEC Take 40 mg by mouth 2 (two) times daily.   ticagrelor 90 MG Tabs tablet Commonly known as:  BRILINTA Take 1 tablet (90 mg total) by mouth 2 (two) times daily.   VITAMIN B 12 PO Take 1 tablet by mouth every morning.      Follow-up Information    Rachel Bo, NP Follow up on 01/05/2016.   Specialty:  Nurse Practitioner Why:  11 am Contact information: [address] [place] [address] [phone]        Phineas Inches, MD. Schedule an appointment as soon as possible for a visit.   Specialty:  Family Medicine Contact information: [address] [place] [address] [phone]            The results of significant diagnostics from this hospitalization (including imaging, microbiology, ancillary and laboratory) are listed below for reference.     Microbiology: No results found for this or any previous visit (from the past 240 hour(s)).   Labs: Basic Metabolic Panel:  Recent Labs Lab 12/22/15 1804 12/24/15 0246 12/25/15 0259  NA 136 135 134*  K 3.4* 3.3* 3.2*  CL 102 104 101  CO2 21* 24 23  GLUCOSE 125* 124* 130*  BUN '10 10 11  '$ CREATININE 1.24 0.98 0.86  CALCIUM 8.7* 8.6* 8.9   CBC:  Recent Labs Lab 12/22/15 1804  12/24/15 0246 12/25/15 0259  WBC 13.4* 8.7 10.1  HGB 14.8 14.3 14.0  HCT 44.0 39.8 41.2  MCV 84.5 84.5 83.7  PLT 254 212 223   Cardiac Enzymes:  Recent Labs Lab 12/22/15 2303 12/23/15 0500 12/23/15 0931 12/23/15 1530 12/23/15 2103  TROPONINI 2.89* 3.71* 3.40* 2.83* 2.25*   BNP (last 3 results)  Recent Labs  12/22/15 1806  BNP 16.8   CBG:  Recent Labs Lab 12/24/15 1109 12/24/15 1844 12/24/15 2110 12/25/15 0008 12/25/15 0609  GLUCAP 138* 156* 162* 83 101*   SIGNED: Time coordinating discharge: 30 minutes  MAGICK-Azalie Harbeck, MD  [facility] 12/25/2015, 12:02 PM Pager [phone]  If 7PM-7AM, please contact night-coverage [email or web address] Password [email or web address]

## 2015-12-25 NOTE — Progress Notes (Signed)
Subjective:  No further chest pain.  Feels well.  Has chronic baseline  Dyspnea.  Like more dyspneic on Brilinta improved withcaffeine.  Objective:  Vital Signs in the last 24 hours: Temp:  [97.3 F (36.3 C)-98.1 F (36.7 C)] 97.7 F (36.5 C) (12/08 0809) Pulse Rate:  [0-117] 77 (12/08 0809) Resp:  [0-26] 26 (12/08 0809) BP: (105-170)/(57-97) 170/97 (12/08 0809) SpO2:  [0 %-100 %] 98 % (12/08 0929) Weight:  [99.2 kg (218 lb 11.1 oz)] 99.2 kg (218 lb 11.1 oz) (12/08 0431)  Intake/Output from previous day: 12/07 0701 - 12/08 0700 In: 517.8 [P.O.:420; I.V.:97.8] Out: 1200 [Urine:1200]  Physical Exam: General appearance: alert, cooperative, appears stated age, no distress and mildly obese Lungs: clear to auscultation bilaterally and barrel shaped Chest wall: no tenderness Heart: regular rate and rhythm, S1, S2 normal, no murmur, click, rub or gallop Abdomen: soft, non-tender; bowel sounds normal; no masses,  no organomegaly and Obese Extremities: extremities normal, atraumatic, no cyanosis or edema Pulses: 2+ and symmetric Neurologic: Grossly normal Lab Results: BMP  Recent Labs  12/22/15 1804 12/24/15 0246 12/25/15 0259  NA 136 135 134*  K 3.4* 3.3* 3.2*  CL 102 104 101  CO2 21* 24 23  GLUCOSE 125* 124* 130*  BUN '10 10 11  '$ CREATININE 1.24 0.98 0.86  CALCIUM 8.7* 8.6* 8.9  GFRNONAA 59* >60 >60  GFRAA >60 >60 >60    CBC  Recent Labs Lab 12/25/15 0259  WBC 10.1  RBC 4.92  HGB 14.0  HCT 41.2  PLT 223  MCV 83.7  MCH 28.5  MCHC 34.0  RDW 14.9    HEMOGLOBIN A1C Lab Results  Component Value Date   HGBA1C 6.7 (H) 09/28/2013   MPG 146 (H) 09/28/2013    Cardiac Panel (last 3 results)  Recent Labs  12/23/15 0931 12/23/15 1530 12/23/15 2103  TROPONINI 3.40* 2.83* 2.25*   Lipid Panel     Component Value Date/Time   CHOL 105 12/23/2015 0931   TRIG 51 12/23/2015 0931   HDL 54 12/23/2015 0931   CHOLHDL 1.9 12/23/2015 0931   VLDL 10 12/23/2015 0931    LDLCALC 41 12/23/2015 0931      Recent Labs  01/09/15 1930  PROT 6.1*  ALBUMIN 3.0*  AST 12*  ALT 13*  ALKPHOS 88  BILITOT 0.5    Imaging: Imaging results have been reviewed  Cardiac Studies:  EKG 01/25/2015: Normal sinus rhythm, left axis deviation, nonspecific T wave inversion in inferior leads.  Scheduled Meds: . amLODipine  10 mg Oral Daily  . aspirin EC  81 mg Oral Daily  . atorvastatin  5 mg Oral Daily  . bisoprolol  5 mg Oral Daily  . cloNIDine  0.3 mg Oral BID  . cyclobenzaprine  5 mg Oral QHS  . DULoxetine  30 mg Oral Daily  . gabapentin  400 mg Oral TID  . insulin aspart  0-9 Units Subcutaneous Q6H  . mometasone-formoterol  2 puff Inhalation BID  . nicotine  21 mg Transdermal Daily  . nortriptyline  25 mg Oral QHS  . pantoprazole  40 mg Oral BID AC  . sodium chloride flush  3 mL Intravenous Q12H  . sodium chloride flush  3 mL Intravenous Q12H  . ticagrelor  90 mg Oral BID   Continuous Infusions: PRN Meds:.sodium chloride, sodium chloride, acetaminophen, fentaNYL (SUBLIMAZE) injection, gi cocktail, morphine injection, ondansetron (ZOFRAN) IV, sodium chloride flush, sodium chloride flush    Assessment/Plan:  1. NSTEMI 2. CAD  Coronary angiogram 12/23/2015 had revealed Ost LAD lesion, 20 %stenosed. Dist LAD lesion, 30 %stenosed. Distal RCA which is very large had an ulcerated 50% stenosis and 1st RPLB lesion, 40 %stenosed.  Normal LVEF.  44/07/2015: Recurrent chest pain, Successful PTCA and stenting of the distal RCA with 4.0 x 12 mm onyx DES, stenosis reduced from 80% to 0% with maintenance of TIMI-3 flow.  3. 3. Hyperlipidemia 4. Hypertension 5. Diabetes mellitus type 2 controlled without hypoglycemia without insulin. 6. Emphysema with history of pneumothorax in the past, left upper lobe lobectomy, continued tobacco use disorder and on home oxygen.  Recommendations: We'll prescribe him nicotine patches.  He'll be discharged home on Brilinta along  with aspirin.  If he indeed develops significant dyspnea on Brilinta, we can switch it over to Plavix.  Smoking cessation has been discussed extensively.  Lipitor to 80 mg daily, I'll see him back in the office in 2 weeks on 01/05/2016: At 11 AM.  Adrian Prows, [name] 12/25/2015, 11:11 AM [facility] Pager: [phone] Office: [phone] If no answer: [phone]

## 2015-12-25 NOTE — Progress Notes (Signed)
TR BAND REMOVAL  LOCATION:    right radial  DEFLATED PER PROTOCOL:    Yes.    TIME BAND OFF / DRESSING APPLIED:    12/24/2015 @ 2300   SITE UPON ARRIVAL:    Level 0  SITE AFTER BAND REMOVAL:    Level 0  CIRCULATION SENSATION AND MOVEMENT:    Within Normal Limits   Yes.    COMMENTS:   Pt tolerated well; radial site care teaching reinforced. Patient did not re verbalize teaching

## 2016-02-10 ENCOUNTER — Emergency Department (HOSPITAL_COMMUNITY): Payer: Medicare Other

## 2016-02-10 ENCOUNTER — Encounter (HOSPITAL_COMMUNITY): Payer: Self-pay | Admitting: Radiology

## 2016-02-10 ENCOUNTER — Inpatient Hospital Stay (HOSPITAL_COMMUNITY)
Admission: EM | Admit: 2016-02-10 | Discharge: 2016-02-12 | DRG: 191 | Disposition: A | Discharge status: Home Health | Payer: Medicare Other | Attending: Internal Medicine | Admitting: Internal Medicine

## 2016-02-10 DIAGNOSIS — I110 Hypertensive heart disease with heart failure: Secondary | ICD-10-CM | POA: Diagnosis present

## 2016-02-10 DIAGNOSIS — R0789 Other chest pain: Secondary | ICD-10-CM

## 2016-02-10 DIAGNOSIS — R911 Solitary pulmonary nodule: Secondary | ICD-10-CM | POA: Diagnosis present

## 2016-02-10 DIAGNOSIS — Z7984 Long term (current) use of oral hypoglycemic drugs: Secondary | ICD-10-CM

## 2016-02-10 DIAGNOSIS — Z7902 Long term (current) use of antithrombotics/antiplatelets: Secondary | ICD-10-CM

## 2016-02-10 DIAGNOSIS — I2510 Atherosclerotic heart disease of native coronary artery without angina pectoris: Secondary | ICD-10-CM | POA: Diagnosis present

## 2016-02-10 DIAGNOSIS — J441 Chronic obstructive pulmonary disease with (acute) exacerbation: Secondary | ICD-10-CM | POA: Diagnosis present

## 2016-02-10 DIAGNOSIS — J449 Chronic obstructive pulmonary disease, unspecified: Secondary | ICD-10-CM | POA: Diagnosis present

## 2016-02-10 DIAGNOSIS — F17210 Nicotine dependence, cigarettes, uncomplicated: Secondary | ICD-10-CM | POA: Diagnosis present

## 2016-02-10 DIAGNOSIS — Z7951 Long term (current) use of inhaled steroids: Secondary | ICD-10-CM | POA: Diagnosis not present

## 2016-02-10 DIAGNOSIS — I10 Essential (primary) hypertension: Secondary | ICD-10-CM | POA: Diagnosis present

## 2016-02-10 DIAGNOSIS — I5189 Other ill-defined heart diseases: Secondary | ICD-10-CM

## 2016-02-10 DIAGNOSIS — Z955 Presence of coronary angioplasty implant and graft: Secondary | ICD-10-CM | POA: Diagnosis not present

## 2016-02-10 DIAGNOSIS — Z8673 Personal history of transient ischemic attack (TIA), and cerebral infarction without residual deficits: Secondary | ICD-10-CM | POA: Diagnosis not present

## 2016-02-10 DIAGNOSIS — E119 Type 2 diabetes mellitus without complications: Secondary | ICD-10-CM | POA: Diagnosis present

## 2016-02-10 DIAGNOSIS — I5032 Chronic diastolic (congestive) heart failure: Secondary | ICD-10-CM | POA: Diagnosis present

## 2016-02-10 DIAGNOSIS — J101 Influenza due to other identified influenza virus with other respiratory manifestations: Secondary | ICD-10-CM | POA: Diagnosis present

## 2016-02-10 DIAGNOSIS — Z79899 Other long term (current) drug therapy: Secondary | ICD-10-CM

## 2016-02-10 DIAGNOSIS — Z825 Family history of asthma and other chronic lower respiratory diseases: Secondary | ICD-10-CM

## 2016-02-10 DIAGNOSIS — I152 Hypertension secondary to endocrine disorders: Secondary | ICD-10-CM | POA: Diagnosis present

## 2016-02-10 DIAGNOSIS — K219 Gastro-esophageal reflux disease without esophagitis: Secondary | ICD-10-CM | POA: Diagnosis present

## 2016-02-10 DIAGNOSIS — Z7982 Long term (current) use of aspirin: Secondary | ICD-10-CM

## 2016-02-10 DIAGNOSIS — I252 Old myocardial infarction: Secondary | ICD-10-CM

## 2016-02-10 DIAGNOSIS — I700 Atherosclerosis of aorta: Secondary | ICD-10-CM | POA: Diagnosis present

## 2016-02-10 DIAGNOSIS — F17200 Nicotine dependence, unspecified, uncomplicated: Secondary | ICD-10-CM | POA: Diagnosis present

## 2016-02-10 DIAGNOSIS — Z8249 Family history of ischemic heart disease and other diseases of the circulatory system: Secondary | ICD-10-CM

## 2016-02-10 DIAGNOSIS — R079 Chest pain, unspecified: Secondary | ICD-10-CM | POA: Diagnosis present

## 2016-02-10 DIAGNOSIS — I5030 Unspecified diastolic (congestive) heart failure: Secondary | ICD-10-CM | POA: Diagnosis present

## 2016-02-10 DIAGNOSIS — R9389 Abnormal findings on diagnostic imaging of other specified body structures: Secondary | ICD-10-CM | POA: Diagnosis present

## 2016-02-10 DIAGNOSIS — R0602 Shortness of breath: Secondary | ICD-10-CM

## 2016-02-10 HISTORY — DX: Pure hypercholesterolemia, unspecified: E78.00

## 2016-02-10 HISTORY — DX: Cerebral infarction, unspecified: I63.9

## 2016-02-10 LAB — GLUCOSE, CAPILLARY: Glucose-Capillary: 301 mg/dL — ABNORMAL HIGH (ref 65–99)

## 2016-02-10 LAB — COMPREHENSIVE METABOLIC PANEL
ALT: 16 U/L — ABNORMAL LOW (ref 17–63)
AST: 22 U/L (ref 15–41)
Albumin: 3.3 g/dL — ABNORMAL LOW (ref 3.5–5.0)
Alkaline Phosphatase: 74 U/L (ref 38–126)
Anion gap: 12 (ref 5–15)
BUN: 10 mg/dL (ref 6–20)
CO2: 21 mmol/L — ABNORMAL LOW (ref 22–32)
Calcium: 8.5 mg/dL — ABNORMAL LOW (ref 8.9–10.3)
Chloride: 99 mmol/L — ABNORMAL LOW (ref 101–111)
Creatinine, Ser: 1.09 mg/dL (ref 0.61–1.24)
GFR calc Af Amer: >60 mL/min (ref >60)
GFR calc non Af Amer: >60 mL/min (ref >60)
Glucose, Bld: 111 mg/dL — ABNORMAL HIGH (ref 65–99)
Potassium: 3.5 mmol/L (ref 3.5–5.1)
Sodium: 132 mmol/L — ABNORMAL LOW (ref 135–145)
Total Bilirubin: 0.7 mg/dL (ref 0.3–1.2)
Total Protein: 7.4 g/dL (ref 6.5–8.1)

## 2016-02-10 LAB — CBC
HCT: 42.9 % (ref 39.0–52.0)
Hemoglobin: 14.5 g/dL (ref 13.0–17.0)
MCH: 28.4 pg (ref 26.0–34.0)
MCHC: 33.8 g/dL (ref 30.0–36.0)
MCV: 84 fL (ref 78.0–100.0)
Platelets: 189 10*3/uL (ref 150–400)
RBC: 5.11 MIL/uL (ref 4.22–5.81)
RDW: 15.4 % (ref 11.5–15.5)
WBC: 7.9 10*3/uL (ref 4.0–10.5)

## 2016-02-10 LAB — MAGNESIUM: Magnesium: 1.7 mg/dL (ref 1.7–2.4)

## 2016-02-10 LAB — INFLUENZA PANEL BY PCR (TYPE A & B)
Influenza A By PCR: POSITIVE — AB
Influenza B By PCR: NEGATIVE

## 2016-02-10 LAB — TROPONIN I
Troponin I: <0.03 ng/mL (ref <0.03)
Troponin I: <0.03 ng/mL (ref <0.03)

## 2016-02-10 LAB — BRAIN NATRIURETIC PEPTIDE: B Natriuretic Peptide: 70.1 pg/mL (ref 0.0–100.0)

## 2016-02-10 LAB — PROTIME-INR
INR: 1.06
Prothrombin Time: 13.8 seconds (ref 11.4–15.2)

## 2016-02-10 LAB — I-STAT TROPONIN, ED: Troponin i, poc: 0 ng/mL (ref 0.00–0.08)

## 2016-02-10 LAB — D-DIMER, QUANTITATIVE: D-Dimer, Quant: 1.23 ug/mL-FEU — ABNORMAL HIGH (ref 0.00–0.50)

## 2016-02-10 MED ORDER — TICAGRELOR 90 MG PO TABS
90.0000 mg | ORAL_TABLET | Freq: Two times a day (BID) | ORAL | Status: DC
  Administered 2016-02-10 – 2016-02-12 (×4): 90 mg via ORAL
  Filled 2016-02-10 (×4): qty 1

## 2016-02-10 MED ORDER — INSULIN ASPART 100 UNIT/ML ~~LOC~~ SOLN
0.0000 [IU] | Freq: Three times a day (TID) | SUBCUTANEOUS | Status: DC
  Administered 2016-02-11 – 2016-02-12 (×5): 2 [IU] via SUBCUTANEOUS

## 2016-02-10 MED ORDER — OSELTAMIVIR PHOSPHATE 75 MG PO CAPS
75.0000 mg | ORAL_CAPSULE | Freq: Two times a day (BID) | ORAL | Status: DC
Start: 2016-02-10 — End: 2016-02-12
  Administered 2016-02-10 – 2016-02-12 (×4): 75 mg via ORAL
  Filled 2016-02-10 (×5): qty 1

## 2016-02-10 MED ORDER — ENOXAPARIN SODIUM 40 MG/0.4ML ~~LOC~~ SOLN
40.0000 mg | SUBCUTANEOUS | Status: DC
  Administered 2016-02-10 – 2016-02-11 (×2): 40 mg via SUBCUTANEOUS
  Filled 2016-02-10 (×2): qty 0.4

## 2016-02-10 MED ORDER — MOMETASONE FURO-FORMOTEROL FUM 200-5 MCG/ACT IN AERO
2.0000 | INHALATION_SPRAY | Freq: Two times a day (BID) | RESPIRATORY_TRACT | Status: DC
  Administered 2016-02-11 – 2016-02-12 (×3): 2 via RESPIRATORY_TRACT
  Filled 2016-02-10: qty 8.8

## 2016-02-10 MED ORDER — POLYETHYLENE GLYCOL 3350 17 G PO PACK: 17.0000 g | PACK | Freq: Every day | ORAL | Status: DC | PRN

## 2016-02-10 MED ORDER — INSULIN ASPART 100 UNIT/ML ~~LOC~~ SOLN
3.0000 [IU] | Freq: Once | SUBCUTANEOUS | Status: AC
  Administered 2016-02-10: 3 [IU] via SUBCUTANEOUS

## 2016-02-10 MED ORDER — PREDNISONE 20 MG PO TABS: 40.0000 mg | ORAL_TABLET | Freq: Every day | ORAL | Status: DC

## 2016-02-10 MED ORDER — NICOTINE 21 MG/24HR TD PT24
21.0000 mg | MEDICATED_PATCH | Freq: Every day | TRANSDERMAL | Status: DC
  Administered 2016-02-10 – 2016-02-12 (×3): 21 mg via TRANSDERMAL
  Filled 2016-02-10 (×3): qty 1

## 2016-02-10 MED ORDER — ATORVASTATIN CALCIUM 80 MG PO TABS
80.0000 mg | ORAL_TABLET | Freq: Every evening | ORAL | Status: DC
Start: 2016-02-10 — End: 2016-02-12
  Administered 2016-02-10 – 2016-02-11 (×2): 80 mg via ORAL
  Filled 2016-02-10 (×2): qty 1

## 2016-02-10 MED ORDER — GABAPENTIN 400 MG PO CAPS
400.0000 mg | ORAL_CAPSULE | Freq: Three times a day (TID) | ORAL | Status: DC
  Administered 2016-02-10 – 2016-02-12 (×6): 400 mg via ORAL
  Filled 2016-02-10 (×6): qty 1

## 2016-02-10 MED ORDER — BISOPROLOL FUMARATE 5 MG PO TABS
5.0000 mg | ORAL_TABLET | Freq: Every day | ORAL | Status: DC
  Administered 2016-02-11 – 2016-02-12 (×2): 5 mg via ORAL
  Filled 2016-02-10 (×2): qty 1

## 2016-02-10 MED ORDER — ASPIRIN 81 MG PO CHEW: 324.0000 mg | CHEWABLE_TABLET | Freq: Once | ORAL | Status: DC

## 2016-02-10 MED ORDER — DULOXETINE HCL 30 MG PO CPEP
30.0000 mg | ORAL_CAPSULE | Freq: Every day | ORAL | Status: DC
  Administered 2016-02-11 – 2016-02-12 (×2): 30 mg via ORAL
  Filled 2016-02-10 (×2): qty 1

## 2016-02-10 MED ORDER — IOPAMIDOL (ISOVUE-370) INJECTION 76%
INTRAVENOUS | Status: AC
  Administered 2016-02-10: 80 mL
  Filled 2016-02-10: qty 100

## 2016-02-10 MED ORDER — SODIUM CHLORIDE 0.9 % IV SOLN
INTRAVENOUS | Status: AC
  Administered 2016-02-10 – 2016-02-11 (×2): via INTRAVENOUS

## 2016-02-10 MED ORDER — AMLODIPINE BESYLATE 10 MG PO TABS
10.0000 mg | ORAL_TABLET | ORAL | Status: DC
  Administered 2016-02-11 – 2016-02-12 (×2): 10 mg via ORAL
  Filled 2016-02-10 (×2): qty 1

## 2016-02-10 MED ORDER — ONDANSETRON HCL 4 MG PO TABS: 4.0000 mg | ORAL_TABLET | Freq: Four times a day (QID) | ORAL | Status: DC | PRN

## 2016-02-10 MED ORDER — ALBUTEROL SULFATE (2.5 MG/3ML) 0.083% IN NEBU
5.0000 mg | INHALATION_SOLUTION | Freq: Once | RESPIRATORY_TRACT | Status: AC
  Administered 2016-02-10: 5 mg via RESPIRATORY_TRACT
  Filled 2016-02-10: qty 6

## 2016-02-10 MED ORDER — NORTRIPTYLINE HCL 25 MG PO CAPS
25.0000 mg | ORAL_CAPSULE | Freq: Every day | ORAL | Status: DC
  Administered 2016-02-10 – 2016-02-11 (×2): 25 mg via ORAL
  Filled 2016-02-10 (×2): qty 1

## 2016-02-10 MED ORDER — CLONIDINE HCL 0.3 MG PO TABS
0.3000 mg | ORAL_TABLET | Freq: Two times a day (BID) | ORAL | Status: DC
  Administered 2016-02-10 – 2016-02-12 (×4): 0.3 mg via ORAL
  Filled 2016-02-10 (×4): qty 1

## 2016-02-10 MED ORDER — IRBESARTAN 150 MG PO TABS
150.0000 mg | ORAL_TABLET | Freq: Every day | ORAL | Status: DC
  Administered 2016-02-11 – 2016-02-12 (×2): 150 mg via ORAL
  Filled 2016-02-10 (×2): qty 1

## 2016-02-10 MED ORDER — ACETAMINOPHEN 650 MG RE SUPP: 650.0000 mg | Freq: Four times a day (QID) | RECTAL | Status: DC | PRN

## 2016-02-10 MED ORDER — METHYLPREDNISOLONE SODIUM SUCC 125 MG IJ SOLR
125.0000 mg | Freq: Once | INTRAMUSCULAR | Status: AC
  Administered 2016-02-10: 125 mg via INTRAVENOUS
  Filled 2016-02-10: qty 2

## 2016-02-10 MED ORDER — ACETAMINOPHEN 325 MG PO TABS
650.0000 mg | ORAL_TABLET | Freq: Four times a day (QID) | ORAL | Status: DC | PRN
  Administered 2016-02-10 – 2016-02-11 (×2): 650 mg via ORAL
  Filled 2016-02-10 (×2): qty 2

## 2016-02-10 MED ORDER — ONDANSETRON HCL 4 MG/2ML IJ SOLN: 4.0000 mg | Freq: Four times a day (QID) | INTRAMUSCULAR | Status: DC | PRN

## 2016-02-10 MED ORDER — AZITHROMYCIN 250 MG PO TABS: 500.0000 mg | ORAL_TABLET | Freq: Every day | ORAL | Status: DC

## 2016-02-10 MED ORDER — PANTOPRAZOLE SODIUM 40 MG PO TBEC
40.0000 mg | DELAYED_RELEASE_TABLET | Freq: Every day | ORAL | Status: DC
  Administered 2016-02-10 – 2016-02-12 (×3): 40 mg via ORAL
  Filled 2016-02-10 (×3): qty 1

## 2016-02-10 MED ORDER — AZITHROMYCIN 250 MG PO TABS: 250.0000 mg | ORAL_TABLET | Freq: Every day | ORAL | Status: DC

## 2016-02-10 MED ORDER — ALBUTEROL SULFATE (2.5 MG/3ML) 0.083% IN NEBU
5.0000 mg | INHALATION_SOLUTION | RESPIRATORY_TRACT | Status: DC | PRN
  Administered 2016-02-10: 5 mg via RESPIRATORY_TRACT
  Filled 2016-02-10: qty 6

## 2016-02-10 MED ORDER — SODIUM CHLORIDE 0.9% FLUSH
3.0000 mL | Freq: Two times a day (BID) | INTRAVENOUS | Status: DC
  Administered 2016-02-10 – 2016-02-12 (×4): 3 mL via INTRAVENOUS

## 2016-02-10 MED ORDER — ASPIRIN EC 325 MG PO TBEC
325.0000 mg | DELAYED_RELEASE_TABLET | ORAL | Status: DC
  Administered 2016-02-11 – 2016-02-12 (×2): 325 mg via ORAL
  Filled 2016-02-10 (×2): qty 1

## 2016-02-10 MED ORDER — IPRATROPIUM-ALBUTEROL 0.5-2.5 (3) MG/3ML IN SOLN
3.0000 mL | Freq: Four times a day (QID) | RESPIRATORY_TRACT | Status: DC
  Administered 2016-02-10: 3 mL via RESPIRATORY_TRACT
  Filled 2016-02-10: qty 3

## 2016-02-10 NOTE — H&P (Signed)
Date: 02/10/2016               Patient Name:  Jonathon [name] MRN: [id number]  DOB: 1950/02/02 Age / Sex: 66 y.o., male   PCP: [name], MD         Medical Service: Internal Medicine Teaching Service         Attending Physician: Dr. Lucious Groves, DO    First Contact: Dr. Hetty Ely Pager: [phone]  Second Contact: Dr. Juleen China  Pager: [phone]        After Hours (After 5p/  First Contact Pager: [phone]  weekends / holidays): Second Contact Pager: [phone]   Chief Complaint: productive cough  History of Present Illness: [name] is a 66 y.o. male with a PMH of COPD GOLD II, coronary artery disease (s/p DES in distal RCA 12/17), type 2 DM, hypertension, tobacco use, diastolic dysfunction who presents with cough and chest pain. Two days ago he was exposed to a sick contact and developed cough and chest pain. He has a chronic smokers cough but this cough has become worse with increased dark brown sputum production, wheeze, and chills. The chest pain is worse with exertion and coughing but improves with rest. The chest pain is all the way across from the right side of his chest to the left. He has tried nitro, inhalers, tylenol, and aspirin to relieve the pain but these only helped slightly and when the pain returned it was worse than before. The pain is associated with diaphoresis and palpitations, he denies nausea. He has worsening of his cough and pain when he lies down.  He has a history of COPD and is not on home oxygen. He has been smoking for the last 50 years, at one time he smoked 2 packs per day, currently he smokes 6-10 cigarettes per day. On a good day he is able to walk a few feet before developing shortness of breath. The last time he needed antibiotics for COPD exacerbation was 8 month ago.   At this time in the ED he feels as though he is having a difficult time breathing but he is not having any chest pain. SpO2 is 96% on 1 liter.   Meds:  Current Meds    Medication Sig  . albuterol (PROVENTIL) (2.5 MG/3ML) 0.083% nebulizer solution Take 2.5 mg by nebulization every 4 (four) hours as needed for wheezing or shortness of breath.  . Albuterol Sulfate (PROAIR RESPICLICK) 559 (90 BASE) MCG/ACT AEPB Inhale 2 puffs into the lungs 4 (four) times daily as needed.  Marland Kitchen amLODipine (NORVASC) 10 MG tablet Take 10 mg by mouth every morning.   Marland Kitchen aspirin EC 325 MG tablet Take 650 mg by mouth every morning.   Marland Kitchen atorvastatin (LIPITOR) 80 MG tablet Take 1 tablet (80 mg total) by mouth every morning. Take 1/2 tablet by mouth every morning (Patient taking differently: Take 40 mg by mouth every morning. Take 1/2 tablet by mouth every morning)  . bisoprolol (ZEBETA) 5 MG tablet Take 1 tablet (5 mg total) by mouth daily.  . budesonide-formoterol (SYMBICORT) 160-4.5 MCG/ACT inhaler Inhale 2 puffs into the lungs 2 (two) times daily.  . cloNIDine (CATAPRES) 0.3 MG tablet Take 0.3 mg by mouth 2 (two) times daily.  . clopidogrel (PLAVIX) 75 MG tablet Take 75 mg by mouth daily.  . Cyanocobalamin (VITAMIN B 12 PO) Take 1 tablet by mouth every morning.   . cyclobenzaprine (FLEXERIL) 5 MG tablet Take 5 mg  by mouth at bedtime.   . DULoxetine (CYMBALTA) 30 MG capsule Take 30 mg by mouth every morning.   . gabapentin (NEURONTIN) 400 MG capsule Take 400 mg by mouth 3 (three) times daily.   . irbesartan (AVAPRO) 150 MG tablet TAKE ONE (1) TABLET BY MOUTH EVERY DAY (Patient taking differently: TAKE ONE (1) TABLET BY MOUTH AS NEEDED FOR BP)  . metFORMIN (GLUCOPHAGE) 500 MG tablet Take 500 mg by mouth daily with breakfast.  . nitroGLYCERIN (NITROSTAT) 0.4 MG SL tablet Place 0.4 mg under the tongue every 5 (five) minutes as needed for chest pain.  . nortriptyline (PAMELOR) 25 MG capsule Take 25 mg by mouth at bedtime.  Marland Kitchen omeprazole (PRILOSEC) 40 MG capsule Take 40 mg by mouth 2 (two) times daily.  . ticagrelor (BRILINTA) 90 MG TABS tablet Take 1 tablet (90 mg total) by mouth 2 (two) times  daily.     Allergies: Allergies as of 02/10/2016  . (No Known Allergies)   Past Medical History:  Diagnosis Date  . CHF (congestive heart failure) ([place])   . Chronic cough   . COPD, severe ([place])    PT DENIES REGULAR DAILY INHALER ANY PRN  . Coronary artery disease   . Diabetes mellitus without complication ([place])    type 2  . Dyspnea   . GERD (gastroesophageal reflux disease)   . Harsh voice quality    PT STATES NORMAL FOR HIM  . History of CHF (congestive heart failure) MONITORED BY DR BOUSKA   DIASTOLIC  . History of CVA (cerebrovascular accident) 02-22-2011   LEFT BRAIN STEM PONTINE HEMORRHAGE--  RIGHT SIDED WEAKNESS  . Hydrocele, left   . Hypertension   . NSTEMI (non-ST elevated myocardial infarction) ([place]) 12/2015  . Pre-operative clearance    GIVEN BY DR Coletta Memos  . Short of breath on exertion   . Smoker   . Weakness of right side of body    S/P CVA   FEB 2013    Family History:  Family History  Problem Relation Age of Onset  . Hypertension Mother   . Stroke Mother   . Lung disease Father     died of "colapsed lung" per patient  . Asthma Father   . Colon cancer Neg Hx   . Stomach cancer Neg Hx    Denies family hx of cancer   Social History:  100 pack year smoking hx, has smokes up to 2 PPD for the past 50 years but currently smokes 6-10 cigarettes per day. Expresses the desire to quit. Denies alcohol or illicit drug use.   Review of Systems: A complete ROS was negative except as per HPI.   Physical Exam: Blood pressure (!) 111/54, pulse 64, temperature 100.2 F (37.9 C), temperature source Oral, resp. rate 26, height 6' (1.829 m), weight 228 lb (103.4 kg), SpO2 92 %. Physical Exam  Constitutional: He is oriented to person, place, and time. He appears well-developed and well-nourished. No distress.  Eyes: Conjunctivae are normal. No scleral icterus.  Cardiovascular: Normal rate and regular rhythm.   No murmur heard. Pulmonary/Chest: Effort normal. No  respiratory distress. He has no wheezes. He has no rales. He exhibits no tenderness.  Speaking in full sentences   Abdominal: Soft. He exhibits no distension. There is no tenderness.  Neurological: He is alert and oriented to person, place, and time.  Skin: Skin is warm and dry. He is not diaphoretic.  Psychiatric: He has a normal mood and affect. His behavior is normal.  Labs  CMP Na 132, K 3.5, CO2 21, CUN 10, Crt 1.09, GFR >60, Glucose 111, albumin 3.3, alk phos 74, AST 22, ALT 16 CBC WBC 7.9, Hgb 14.5, Plt 189 Trop I <0.03  D-dimer 1.23 BNP 70  PT 13.8, INR 1.06  EKG: Sinus rhythm, left axis deviation,T wave inversions and flattening in II, III, and AVF have improved   [id number] review of the chest xray reveals right rib fracture present on prior films, chronic lung disease, no consolidation.   CT angio chest: Did not reveal proximal PE   Assessment & Plan by Problem:    COPD GOLD II with reversibility  Influenza A infection  66 year old man with PMH of COPD GOLD II, coronary artery disease (s/p DES in distal RCA 12/17), type 2 DM, hypertension, tobacco use, diastolic dysfunction who presents with productive cough and chest pain. In the ED he received solu-medrol 125 mg injection and albuterol nebulizer treatment. He did not feel relief of his shortness of breath after these treatments. Chest xray shows no infiltrate but Influenza PCR revealed that he is flu A positive. It is not completely clear if he is in a COPD exacerbation at this time but he has received steroids already and we will readdress the need to start a steroid taper and azithromycin. Home meds include albuterol nebs, albuterol sulfate, and symbicort inhaler.  -continue symbicort inhaler and albuterol nebs -ordered tamiflu  - continue normal saline 75 cc/hr  -ordered zofran     Chest pain   Coronary artery disease Description of this chest pain seems to be more pleuritic in nature and related to recent viral  illness. However he describes some worsening with exertion and given his hx of recent stent we will monitor. EKG shows normalization of previously inverted T waves and initial troponin 0.0 is reassuring.  -trend troponin  -ordered home meds asa 325 mg qd and ticagrelor 90 mg BID, atorvastatin 80 mg qd, bisoprolol 5 mg qd, duone q6h     Essential hypertension, benign Currently normotensive  -Ordered home meds amlodipine 10 mg qd, clonidine 0.3 mg bid, irbesartan 150 mg qd    Smoker -ordered nicotine patch     Diastolic heart failure ([place]) Echo 2013 EF 88-82%, grade 1 diastolic dysfunction. Euvolemic on exam.  Type 2 DM A1c 2015 6.7. Home medication is metformin.  - CBG monitoring  -ordered ISS sensitive   Abnormal CT of the chest Lingular pulmonary nodule  Noted on CT angio. increased in size from prior imaging, will need continued monitoring outpatient.   DVT Ppx lovenox  Code Status FULL   Dispo: Admit patient to Inpatient with expected length of stay greater than 2 midnights.  Signed: Ledell Noss, MD 02/10/2016, 5:15 PM  Pager: [phone]

## 2016-02-10 NOTE — ED Notes (Signed)
Unsuccessful attempt at giving report to 3W.   

## 2016-02-10 NOTE — H&P (Signed)
Date: 02/10/2016               Patient Name:  Jonathon [name] MRN: [id number]  DOB: [birth year] Age / Sex: 66 y.o., male   PCP: [name], MD              Medical Service: Internal Medicine Teaching Service              Attending Physician: Dr. Lucious Groves, DO    First Contact: Anthoney Harada, MS3 Pager: [phone]  Second Contact: Dr. Hetty Ely Pager: [phone]  Third Contact Dr. Juleen China Pager: [phone]       After Hours (After 5p/  First Contact Pager: [phone]  weekends / holidays): Second Contact Pager: [phone]   Chief Complaint: Chest Pain  History of Present Illness: Jonathon [name] is a 66 yo man with history of CAD s/p coronany stenting of distal RCA 7 weeks ago, COPD, current smoker, HTN, diastolic CHF, CVA, DM, CVA, that presented the ED with chest pain that started yesterday. Prior to onset of the chest pain, developed a worsening of his usual cough about 2 days ago that has been productive with brownish yellow sputum. The chest pain started yesterday and intensified over night. He feels the pain while coughing and also with just walking. He had an NSTEMI in 12/2015 and feels that his pain is similar in quality, but with more intensity this time. He describes the pain as though someone is squeezing his chest and feels it over his entire chest. This pain is relieved when he sits down to rest. Has also felt pain in his arms and legs, but it is unclear if it is related to the chest pain. He reports subjective fevers, wheezing, and sick contacts at home (grandkids). He easily becomes fatigued and short of breath while walking short distances at home. He denies use of supplemental oxygen at home or having a sore throat. He was given aspirin, albuterol, solu-medrol, & iopamidol in the ED, but is still having a hard time breathing.  Meds: Current Facility-Administered Medications  Medication Dose Route Frequency Provider Last Rate Last Dose  . aspirin chewable tablet 324 mg  324 mg Oral  Once Carmin Muskrat, MD   Stopped at 02/10/16 [phone]   Current Outpatient Prescriptions  Medication Sig Dispense Refill  . albuterol (PROVENTIL) (2.5 MG/3ML) 0.083% nebulizer solution Take 2.5 mg by nebulization every 4 (four) hours as needed for wheezing or shortness of breath.    . Albuterol Sulfate (PROAIR RESPICLICK) 893 (90 BASE) MCG/ACT AEPB Inhale 2 puffs into the lungs 4 (four) times daily as needed. 1 each 5  . amLODipine (NORVASC) 10 MG tablet Take 10 mg by mouth every morning.     Marland Kitchen aspirin EC 325 MG tablet Take 650 mg by mouth every morning.     Marland Kitchen atorvastatin (LIPITOR) 80 MG tablet Take 1 tablet (80 mg total) by mouth every morning. Take 1/2 tablet by mouth every morning (Patient taking differently: Take 40 mg by mouth every morning. Take 1/2 tablet by mouth every morning) 30 tablet 0  . bisoprolol (ZEBETA) 5 MG tablet Take 1 tablet (5 mg total) by mouth daily. 30 tablet 0  . budesonide-formoterol (SYMBICORT) 160-4.5 MCG/ACT inhaler Inhale 2 puffs into the lungs 2 (two) times daily.    . cloNIDine (CATAPRES) 0.3 MG tablet Take 0.3 mg by mouth 2 (two) times daily.    . clopidogrel (PLAVIX) 75 MG tablet Take 75 mg by mouth daily.    Marland Kitchen  Cyanocobalamin (VITAMIN B 12 PO) Take 1 tablet by mouth every morning.     . cyclobenzaprine (FLEXERIL) 5 MG tablet Take 5 mg by mouth at bedtime.     . DULoxetine (CYMBALTA) 30 MG capsule Take 30 mg by mouth every morning.     . gabapentin (NEURONTIN) 400 MG capsule Take 400 mg by mouth 3 (three) times daily.     . irbesartan (AVAPRO) 150 MG tablet TAKE ONE (1) TABLET BY MOUTH EVERY DAY (Patient taking differently: TAKE ONE (1) TABLET BY MOUTH AS NEEDED FOR BP) 30 tablet 0  . metFORMIN (GLUCOPHAGE) 500 MG tablet Take 500 mg by mouth daily with breakfast.    . nitroGLYCERIN (NITROSTAT) 0.4 MG SL tablet Place 0.4 mg under the tongue every 5 (five) minutes as needed for chest pain.    . nortriptyline (PAMELOR) 25 MG capsule Take 25 mg by mouth at bedtime.      Marland Kitchen omeprazole (PRILOSEC) 40 MG capsule Take 40 mg by mouth 2 (two) times daily.    . ticagrelor (BRILINTA) 90 MG TABS tablet Take 1 tablet (90 mg total) by mouth 2 (two) times daily. 60 tablet 0  . nicotine (NICODERM CQ - DOSED IN MG/24 HOURS) 21 mg/24hr patch Place 1 patch (21 mg total) onto the skin daily. (Patient not taking: Reported on 02/10/2016) 28 patch 0    Allergies: Allergies as of 02/10/2016  . (No Known Allergies)   Past Medical History:  Diagnosis Date  . CHF (congestive heart failure) ([place])   . Chronic cough   . COPD, severe ([place])    PT DENIES REGULAR DAILY INHALER ANY PRN  . Coronary artery disease   . Diabetes mellitus without complication ([place])    type 2  . Dyspnea   . GERD (gastroesophageal reflux disease)   . Harsh voice quality    PT STATES NORMAL FOR HIM  . History of CHF (congestive heart failure) MONITORED BY DR BOUSKA   DIASTOLIC  . History of CVA (cerebrovascular accident) 02-22-2011   LEFT BRAIN STEM PONTINE HEMORRHAGE--  RIGHT SIDED WEAKNESS  . Hydrocele, left   . Hypertension   . NSTEMI (non-ST elevated myocardial infarction) ([place]) 12/2015  . Pre-operative clearance    GIVEN BY DR Coletta Memos  . Short of breath on exertion   . Smoker   . Weakness of right side of body    S/P CVA   FEB 2013   Past Surgical History:  Procedure Laterality Date  . ABDOMINAL ANGIOGRAM  09/04/2012   Procedure: ABDOMINAL ANGIOGRAM;  Surgeon: Laverda Page, MD;  Location: [facility] CATH LAB;  Service: Cardiovascular;;  . CARDIAC CATHETERIZATION  05-02-2011  DR Johnsie Cancel   MILD NONOBSTRUCTIVE CAD/ LAD, OM , AND D1/ EF 60^  . CARDIAC CATHETERIZATION  AUG 2000   NON-OBSTRUTIVE CAD/ EF 68%/ 25% PROXIMAL LAD/ 20% MID CIRCUMFLEX  . CARDIAC CATHETERIZATION [name] 12/23/2015   Procedure: Left Heart Cath and Coronary Angiography;  Surgeon: Adrian Prows, MD;  Location: [place] [facility] CV LAB;  Service: Cardiovascular;  Laterality: [name];  . CARDIAC CATHETERIZATION [name] 12/24/2015   Procedure: Coronary  Stent Intervention;  Surgeon: Adrian Prows, MD;  Location: [place] CV LAB;  Service: Cardiovascular;  Laterality: [name];  . CORONARY STENT PLACEMENT  12/24/2015    PTCA and stenting of the distal RCA   . HYDROCELE EXCISION Left 04/24/2012   Procedure: HYDROCELECTOMY ADULT;  Surgeon: Fredricka Bonine, MD;  Location: [facility];  Service: Urology;  Laterality: Left;  90  mins req for this case     . INGUINAL HERNIA REPAIR Left   . LEFT AND RIGHT HEART CATHETERIZATION WITH CORONARY ANGIOGRAM [name] 09/04/2012   Procedure: LEFT AND RIGHT HEART CATHETERIZATION WITH CORONARY ANGIOGRAM;  Surgeon: Laverda Page, MD;  Location: [facility] CATH LAB;  Service: Cardiovascular;  Laterality: [name];  . LEFT HEART CATHETERIZATION WITH CORONARY ANGIOGRAM [name] 05/02/2011   Procedure: LEFT HEART CATHETERIZATION WITH CORONARY ANGIOGRAM;  Surgeon: Josue Hector, MD;  Location: [facility] CATH LAB;  Service: Cardiovascular;  Laterality: [name];  . THORACOTOMY/LOBECTOMY Right 03-06-2008   AND STAPLING OF BLEBS FOR SPONTANOUS PNEUMOTHORAX  . TRANSTHORACIC ECHOCARDIOGRAM  02-17-2011   MODERATE LVH/ LVSF NORMAL/ EF 92-11%/ GRADE I DIASTOLIC DYSFUNCTION   Family History  Problem Relation Age of Onset  . Hypertension Mother   . Stroke Mother   . Lung disease Father     died of "colapsed lung" per patient  . Asthma Father   . Colon cancer Neg Hx   . Stomach cancer Neg Hx    Social History   Social History  . Marital status: Married    Spouse name: [name]  . Number of children: [name]  . Years of education: [name]   Occupational History  . Not on file.   Social History Main Topics  . Smoking status: Current Every Day Smoker    Packs/day: 1.00    Years: 48.00    Types: Cigarettes  . Smokeless tobacco: Never Used     Comment: 1/2 ppd 01/27/14  . Alcohol use 0.0 oz/week     Comment: OCCASIONAL--    . Drug use: No  . Sexual activity: Not on file   Other Topics Concern  . Not on file   Social History Narrative   . No narrative on file    Review of Systems: A comprehensive review of systems was negative.  Physical Exam: Blood pressure (!) 111/54, pulse 64, temperature 100.2 F (37.9 C), temperature source Oral, resp. rate 26, height 6' (1.829 m), weight 103.4 kg (228 lb), SpO2 92 %. BP (!) 111/54   Pulse 64   Temp 100.2 F (37.9 C) (Oral)   Resp 26   Ht 6' (1.829 m)   Wt 103.4 kg (228 lb)   SpO2 92%   BMI 30.92 kg/m   General Appearance:    Alert, cooperative, NAD, appears stated age, appears stated age  Head:    Normocephalic, without obvious abnormality  Eyes:    Conjunctiva/corneas clea  Throat:   Lips, mucosa, and tongue normal  Lungs:     Clear to auscultation bilaterally, unlabored, tachypneic  Chest wall:    No tenderness  Heart:    Regular rate and rhythm, S1 and S2 normal, no murmur, rub   or gallop  Abdomen:     Soft, non-tender, normoactive bowel sounds  Extremities:   Extremities normal, atraumatic, no cyanosis or edema  Pulses:   2+ and symmetric all extremities  Skin:   Skin color, texture, turgor normal, no rashes or lesions  Neurologic:   Grossly normal throughout    Lab results:  CMP Latest Ref Rng & Units 02/10/2016 12/25/2015 12/24/2015  Glucose 65 - 99 mg/dL 111(H) 130(H) 124(H)  BUN 6 - 20 mg/dL '10 11 10  '$ Creatinine 0.61 - 1.24 mg/dL 1.09 0.86 0.98  Sodium 135 - 145 mmol/L 132(L) 134(L) 135  Potassium 3.5 - 5.1 mmol/L 3.5 3.2(L) 3.3(L)  Chloride 101 - 111 mmol/L 99(L) 101 104  CO2 22 - 32  mmol/L 21(L) 23 24  Calcium 8.9 - 10.3 mg/dL 8.5(L) 8.9 8.6(L)  Total Protein 6.5 - 8.1 g/dL 7.4 - -  Total Bilirubin 0.3 - 1.2 mg/dL 0.7 - -  Alkaline Phos 38 - 126 U/L 74 - -  AST 15 - 41 U/L 22 - -  ALT 17 - 63 U/L 16(L) - -    Cardiac Panel (last 3 results)  Recent Labs  02/10/16 0818  TROPONINI <0.03   BNP    Component Value Date/Time   BNP 70.1 02/10/2016 0818   CBC    Component Value Date/Time   WBC 7.9 02/10/2016 0818   RBC 5.11 02/10/2016 0818     HGB 14.5 02/10/2016 0818   HCT 42.9 02/10/2016 0818   PLT 189 02/10/2016 0818   MCV 84.0 02/10/2016 0818   MCH 28.4 02/10/2016 0818   MCHC 33.8 02/10/2016 0818   RDW 15.4 02/10/2016 0818   D-Dimer:  1.23 (H)  Prothrombin Time/INR:  13.8/1.06  Imaging results:  Ct Angio Chest (Result Date: 02/10/2016)   - No evidence of PE  - No acute findings in thorax  - Potential growth of previous noted lingular lesion (rounded atelectasis vs very slow-growing neoplasm)  - Increased mediastinal & right hilar lymphadenopathy  - Aortic atherosclerosis, Left main & 2 vessel CAD   CXR Portable (Result Date: 02/10/2016)   - Cardiomegaly with basilar interstitial prominence  - Cannot exclude CHF component  - Small bilateral pleural effusions  - COPD   Other results: EKG: normal sinus rhythm, nonspecific ST and T waves changes, left axis deviation.  Assessment & Plan by Problem: Active Problems:   Chest pain   Cough   Tobacco use  Jonathon Baxendale is a 66 year-old smoker with his CAD, COPD, CHF, and well-controlled DM that presented to the ED with chest pain and cough.   #Chest pain #Cough   Give his CAD history and placement of sent last month, it is possible that is having an MI, but EKG and troponin levels thus far does not support it. This could also be a COPD exacerbation give the increased cough and mucous production. - Trend troponin over night - Continue albuterol - Continue methylPrednisolone (Solu-Medrol) - Continue Plavix - Consider Abx treatment  #Tobacco use  Patient expressed interest in quitting. He needs to quit for the benefit of his overall health. - Give nicotine patches while admitted - Further assess interest in cessation and assist with plan to quit.   Code: Full  Disposition: Patient is admitted to hospital with expected stay of 1-3 midnights  This is a Careers information officer Note.  The care of the patient was discussed with Dr. Hetty Ely and the assessment and plan was  formulated with their assistance.  Please see their note for official documentation of the patient encounter.   Signed: Twanna Hy, Medical Student 02/10/2016, 3:13 PM

## 2016-02-10 NOTE — ED Notes (Signed)
Patient wants to take his daily meds and has his meds with him.  Made EDP aware.

## 2016-02-10 NOTE — ED Provider Notes (Signed)
[facility] DEPT Provider Note   CSN: [id number] Arrival date & time: 02/10/16  0810     History   Chief Complaint Chief Complaint  Patient presents with  . Chest Pain    HPI Jonathon [name] is a 66 y.o. male.  HPI  Patient presents with concern of chest pain. Patient also has cough, fever. Illness began 2 days ago, and since onset has become worse, with no relief using OTC cough medicine, nor nitroglycerin. No confusion, no disorientation, no vomiting, no diarrhea. There is associated nausea. Patient has multiple sick contacts, grandchildren. Patient has a notable history of recent coronary intervention. He states that he is doing generally well until the past few days. He takes all medication as directed, including antiplatelet medication.  Patient notes that the pain now is sternal, pressure-like, worse with inspiration.    Past Medical History:  Diagnosis Date  . CHF (congestive heart failure) ([place])   . Chronic cough   . COPD, severe ([place])    PT DENIES REGULAR DAILY INHALER ANY PRN  . Coronary artery disease   . Diabetes mellitus without complication ([place])    type 2  . Dyspnea   . GERD (gastroesophageal reflux disease)   . Harsh voice quality    PT STATES NORMAL FOR HIM  . History of CHF (congestive heart failure) MONITORED BY DR BOUSKA   DIASTOLIC  . History of CVA (cerebrovascular accident) 02-22-2011   LEFT BRAIN STEM PONTINE HEMORRHAGE--  RIGHT SIDED WEAKNESS  . Hydrocele, left   . Hypertension   . NSTEMI (non-ST elevated myocardial infarction) ([place]) 12/2015  . Pre-operative clearance    GIVEN BY DR Coletta Memos  . Short of breath on exertion   . Smoker   . Weakness of right side of body    S/P CVA   FEB 2013    Patient Active Problem List   Diagnosis Date Noted  . NSTEMI (non-ST elevated myocardial infarction) ([place]) 12/24/2015  . Pain in joint, shoulder region 11/05/2013  . Abnormal CT of the chest 10/09/2013  . Chest wall pain 10/04/2013    . Mid back pain 10/04/2013  . Type 2 diabetes mellitus without complication, without long-term current use of insulin ([place]) 09/28/2013  . Smoker   . COPD GOLD II with reversibility    . Diastolic heart failure ([place]) 04/16/2012  . Atypical chest pain 04/30/2011  . Alcohol intoxication ([place]) 04/30/2011  . Essential hypertension, benign 04/30/2011  . History of Left Stroke, hemorrhagic 04/30/2011  . Hypokalemia 04/30/2011  . 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 ABDOMINAL ANGIOGRAM  09/04/2012   Procedure: ABDOMINAL ANGIOGRAM;  Surgeon: Laverda Page, MD;  Location: [facility] C[facility] LAB;  Service: Cardiovascular;;  . CARDIAC CATHETERIZATION  05-02-2011  DR Johnsie Cancel   MILD NONOBSTRUCTIVE CAD/ LAD, OM , AND D1/ EF 60^  . CARDIAC CATHETERIZATION  AUG 2000   NON-OBSTRUTIVE CAD/ EF 68%/ 25% PROXIMAL LAD/ 20% MID CIRCUMFLEX  . CARDIAC CATHETERIZATION [name] 12/23/2015   Procedure: Left Heart Cath and Coronary Angiography;  Surgeon: Adrian Prows, MD;  Location: [place] [facility] CV LAB;  Service: Cardiovascular;  Laterality: [name];  . CARDIAC CATHETERIZATION [name] 12/24/2015   Procedure: Coronary Stent Intervention;  Surgeon: Adrian Prows, MD;  Location: [place] CV LAB;  Service: Cardiovascular;  Laterality: [name];  . CORONARY STENT PLACEMENT  12/24/2015    PTCA and stenting of the distal RCA   . HYDROCELE EXCISION Left 04/24/2012   Procedure:  HYDROCELECTOMY ADULT;  Surgeon: Fredricka Bonine, MD;  Location: [facility];  Service: Urology;  Laterality: Left;  90 mins req for this case     . INGUINAL HERNIA REPAIR Left   . LEFT AND RIGHT HEART CATHETERIZATION WITH CORONARY ANGIOGRAM [name] 09/04/2012   Procedure: LEFT AND RIGHT HEART CATHETERIZATION WITH CORONARY ANGIOGRAM;  Surgeon: Laverda Page, MD;  Location: [facility] CATH LAB;  Service: Cardiovascular;  Laterality: [name];  . LEFT HEART CATHETERIZATION WITH CORONARY ANGIOGRAM [name] 05/02/2011    Procedure: LEFT HEART CATHETERIZATION WITH CORONARY ANGIOGRAM;  Surgeon: Josue Hector, MD;  Location: [facility] CATH LAB;  Service: Cardiovascular;  Laterality: [name];  . THORACOTOMY/LOBECTOMY Right 03-06-2008   AND STAPLING OF BLEBS FOR SPONTANOUS PNEUMOTHORAX  . TRANSTHORACIC ECHOCARDIOGRAM  02-17-2011   MODERATE LVH/ LVSF NORMAL/ EF 24-23%/ GRADE I DIASTOLIC DYSFUNCTION       Home Medications    Prior to Admission medications   Medication Sig Start Date End Date Taking? Authorizing Provider  albuterol (PROVENTIL) (2.5 MG/3ML) 0.083% nebulizer solution Take 2.5 mg by nebulization every 4 (four) hours as needed for wheezing or shortness of breath.    Historical Provider, MD  Albuterol Sulfate (PROAIR RESPICLICK) 536 (90 BASE) MCG/ACT AEPB Inhale 2 puffs into the lungs 4 (four) times daily as needed. 02/24/14   Tammy S Parrett, NP  amLODipine (NORVASC) 10 MG tablet Take 10 mg by mouth every morning.     Historical Provider, MD  aspirin EC 325 MG tablet Take 325 mg by mouth every morning.     Historical Provider, MD  atorvastatin (LIPITOR) 80 MG tablet Take 1 tablet (80 mg total) by mouth every morning. Take 1/2 tablet by mouth every morning 12/25/15   Theodis Blaze, MD  bisoprolol (ZEBETA) 5 MG tablet Take 1 tablet (5 mg total) by mouth daily. 12/26/15   Theodis Blaze, MD  budesonide-formoterol (SYMBICORT) 160-4.5 MCG/ACT inhaler Inhale 2 puffs into the lungs 2 (two) times daily.    Historical Provider, MD  cloNIDine (CATAPRES) 0.3 MG tablet Take 0.3 mg by mouth 2 (two) times daily.    Historical Provider, MD  Cyanocobalamin (VITAMIN B 12 PO) Take 1 tablet by mouth every morning.     Historical Provider, MD  cyclobenzaprine (FLEXERIL) 5 MG tablet Take 5 mg by mouth at bedtime.  02/03/14   Historical Provider, MD  DULoxetine (CYMBALTA) 30 MG capsule Take 30 mg by mouth every morning.  02/03/14   Historical Provider, MD  gabapentin (NEURONTIN) 400 MG capsule Take 400 mg by mouth 3 (three) times daily.      Historical Provider, MD  irbesartan (AVAPRO) 150 MG tablet TAKE ONE (1) TABLET BY MOUTH EVERY DAY Patient taking differently: TAKE ONE (1) TABLET BY MOUTH AS NEEDED FOR BP 04/09/15   Tanda Rockers, MD  metFORMIN (GLUCOPHAGE) 500 MG tablet Take 500 mg by mouth daily with breakfast.    Historical Provider, MD  nicotine (NICODERM CQ - DOSED IN MG/24 HOURS) 21 mg/24hr patch Place 1 patch (21 mg total) onto the skin daily. 12/25/15   Theodis Blaze, MD  nitroGLYCERIN (NITROSTAT) 0.4 MG SL tablet Place 0.4 mg under the tongue every 5 (five) minutes as needed for chest pain.    Historical Provider, MD  nortriptyline (PAMELOR) 25 MG capsule Take 25 mg by mouth at bedtime.    Historical Provider, MD  omeprazole (PRILOSEC) 40 MG capsule Take 40 mg by mouth 2 (two) times daily.    Historical Provider,  MD  ticagrelor (BRILINTA) 90 MG TABS tablet Take 1 tablet (90 mg total) by mouth 2 (two) times daily. 12/25/15   Theodis Blaze, MD    Family History Family History  Problem Relation Age of Onset  . Hypertension Mother   . Stroke Mother   . Lung disease Father     died of "colapsed lung" per patient  . Asthma Father   . Colon cancer Neg Hx   . Stomach cancer Neg Hx     Social History Social History  Substance Use Topics  . Smoking status: Current Every Day Smoker    Packs/day: 1.00    Years: 48.00    Types: Cigarettes  . Smokeless tobacco: Never Used     Comment: 1/2 ppd 01/27/14  . Alcohol use 0.0 oz/week     Comment: OCCASIONAL--       Allergies   Patient has no known allergies.   Review of Systems Review of Systems  Constitutional:       Per HPI, otherwise negative  HENT:       Per HPI, otherwise negative  Respiratory:       Per HPI, otherwise negative  Cardiovascular:       Per HPI, otherwise negative  Gastrointestinal: Positive for nausea. Negative for vomiting.  Endocrine:       Negative aside from HPI  Genitourinary:       Neg aside from HPI   Musculoskeletal:       Per  HPI, otherwise negative  Skin: Negative.   Neurological: Negative for syncope.     Physical Exam Updated Vital Signs BP 129/72 (BP Location: Right Arm)   Pulse 86   Temp 100.2 F (37.9 C) (Oral)   Resp 26   Ht 6' (1.829 m)   Wt 228 lb (103.4 kg)   SpO2 98%   BMI 30.92 kg/m   Physical Exam  Constitutional: He is oriented to person, place, and time. He appears well-developed. No distress.  HENT:  Head: Normocephalic and atraumatic.  Eyes: Conjunctivae and EOM are normal.  Cardiovascular: Regular rhythm.   Tachycardic  Pulmonary/Chest: No stridor.  Coarse, diminished breath sounds bilaterally  Abdominal: He exhibits no distension.  Musculoskeletal: He exhibits no edema.  Neurological: He is alert and oriented to person, place, and time.  Skin: Skin is warm and dry.  Psychiatric: He has a normal mood and affect.  Nursing note and vitals reviewed.    ED Treatments / Results  Labs (all labs ordered are listed, but only abnormal results are displayed) Labs Reviewed  COMPREHENSIVE METABOLIC PANEL - Abnormal; Notable for the following:       Result Value   Sodium 132 (*)    Chloride 99 (*)    CO2 21 (*)    Glucose, Bld 111 (*)    Calcium 8.5 (*)    Albumin 3.3 (*)    ALT 16 (*)    All other components within normal limits  D-DIMER, QUANTITATIVE (NOT AT [facility]) - Abnormal; Notable for the following:    D-Dimer, Quant 1.23 (*)    All other components within normal limits  CBC  MAGNESIUM  PROTIME-INR  TROPONIN I  BRAIN NATRIURETIC PEPTIDE  INFLUENZA PANEL BY PCR (TYPE A & B)  I-STAT TROPOININ, ED    EKG  EKG Interpretation  Date/Time:  Wednesday February 10 2016 08:15:14 EST Ventricular Rate:  86 PR Interval:    QRS Duration: 92 QT Interval:  379 QTC Calculation: 454 R Axis:   -  40 Text Interpretation:  Sinus rhythm Left axis deviation T wave abnormality Baseline wander ST-t wave abnormality Abnormal ekg Confirmed by Carmin Muskrat  MD [phone]) on 02/10/2016  8:21:53 AM Also confirmed by Carmin Muskrat  MD ([id number]), editor BREWER, [place], [place] [phone])  on 02/10/2016 8:45:00 AM       Radiology Ct Angio Chest Pe W/cm &/or Wo Cm  Result Date: 02/10/2016 CLINICAL DATA:  66 year old male with history of shortness of breath and chest pain for the past 2 days. Elevated D-dimer. EXAM: CT ANGIOGRAPHY CHEST WITH CONTRAST TECHNIQUE: Multidetector CT imaging of the chest was performed using the standard protocol during bolus administration of intravenous contrast. Multiplanar CT image reconstructions and MIPs were obtained to evaluate the vascular anatomy. CONTRAST:  80 mL of Isovue 370. COMPARISON:  Chest CT 06/27/2014. FINDINGS: Cardiovascular: There are no filling defects within the pulmonary arterial tree to suggest underlying pulmonary embolism. Heart size is normal. There is no significant pericardial fluid, thickening or pericardial calcification. There is aortic atherosclerosis, as well as atherosclerosis of the great vessels of the mediastinum and the coronary arteries, including calcified atherosclerotic plaque in the left main, left circumflex and right coronary arteries. Mediastinum/Nodes: Several prominent borderline enlarged and mildly enlarged mediastinal and hilar lymph nodes are noted. Amongst these, the largest include a 12 mm short axis right hilar lymph node, 11 mm short axis subcarinal lymph node, and 12 mm short axis high right paratracheal lymph node. Small hiatal hernia. No axillary lymphadenopathy. Lungs/Pleura: Diffuse bronchial wall thickening with severe centrilobular and paraseptal emphysema. There is again a pleural-based lesion along the medial aspect of the lingula which demonstrates a nodular component which is very similar to the prior study measuring 2.2 x 2.5 cm (image 96 of series 5). However, there is thickening of soft tissues along the medial margin of this area, best appreciated on image 95 of series 4, which appear increased compared to  the prior study. When measured on image 95 of series 4 of today's examination, the overall dimensions of this lesion are now 5.3 x 2.5 cm, such that some interval growth is suspected. Whether or not this represents progression of rounded atelectasis, or is indicative of a slow-growing neoplasm is uncertain. No acute consolidative airspace disease. No pleural effusions. Right-sided Bochdalek's hernia again noted. Upper Abdomen: 2.0 cm low-attenuation lesion in the upper pole of the left kidney is incompletely characterized on today's noncontrast CT examination, but is similar to prior studies, likely a cyst. Aortic atherosclerosis. Musculoskeletal: There are no aggressive appearing lytic or blastic lesions noted in the visualized portions of the skeleton. Review of the MIP images confirms the above findings. IMPRESSION: 1. No evidence of pulmonary embolism. 2. No acute findings in the thorax. 3. However, there appears to be potential growth of the previously noted lingular lesion. Overall, this lesion had been essentially stable over numerous prior examinations dating back to 2011, suspected to represent benign process such as rounded atelectasis, however, there is soft tissue thickening along the medial margin on today's examination. Whether this simply represents progression of rounded atelectasis or is indicative of a very slow-growing neoplasm is uncertain, however, further evaluation with PET-CT is recommended in the near future to better evaluate this finding. 4. Increasing mediastinal and right hilar lymphadenopathy is also noted compared to prior examinations, also better evaluated on PET-CT. 5. Aortic atherosclerosis, in addition to left main and 2 vessel coronary artery disease. Please note that although the presence of coronary artery calcium documents the presence of  coronary artery disease, the severity of this disease and any potential stenosis cannot be assessed on this non-gated CT examination.  Assessment for potential risk factor modification, dietary therapy or pharmacologic therapy may be warranted, if clinically indicated. 6. Additional incidental findings, as above. Electronically Signed   By: Vinnie Langton [name]   On: 02/10/2016 11:14   Dg Chest Portable 1 View  Result Date: 02/10/2016 CLINICAL DATA:  Chest pain.  Shortness of breath . EXAM: PORTABLE CHEST 1 VIEW COMPARISON:  12/05/ 2017.  01/09/2015 . FINDINGS: Mediastinum and hilar structures are normal. Cardiomegaly. Basilar interstitial prominence. A component mild basilar interstitial edema and/or pneumonitis cannot be excluded. Small bilateral pleural effusions cannot be excluded. Severe bullous COPD . IMPRESSION: 1. Cardiomegaly with basilar interstitial prominence. A component of CHF cannot be excluded. Small bilateral pleural effusions. 2. COPD . Electronically Signed   By: Marcello Moores  Register   On: 02/10/2016 08:59    Procedures Procedures (including critical care time)  Medications Ordered in ED Medications  aspirin chewable tablet 324 mg (0 mg Oral Hold 02/10/16 0826)   Chart review notable for substantial cardiac history.  Patient admitted one month ago for chest pain.  Coronary angiogram 12/23/2015 had revealed Ost LAD lesion, 20 %stenosed. Dist LAD lesion, 30 %stenosed. Distal RCA which is very large had an ulcerated 50% stenosis and 1st RPLB lesion, 40 %stenosed.  Normal LVEF. - Successful PTCA and stenting of the distal RCA with 4.0 x 12 mm onyx DES, stenosis reduced from 80% to 0% with maintenance of TIMI-3 flow     Initial Impression / Assessment and Plan / ED Course  I have reviewed the triage vital signs and the nursing notes.  Pertinent labs & imaging results that were available during my care of the patient were reviewed by me and considered in my medical decision making (see chart for details).   patient with multiple medical issues including CHF, COPD presents with dyspnea, chest pain. Patient is  notable history of recent angiography, with successful stenting. Patient takes antiplatelet medication as directed, but given his description of chest pain, dyspnea and pleuritic pain, differential, including ACS, PE considered. Patient's studies did not demonstrate findings concerning for ongoing ACS, but there is some evidence for worsening COPD, and/or bronchitis, with consideration of malignancy given abnormal CT results as well. Patient presents somewhat after initiation of steroids, breathing treatment. Patient required admission for further evaluation and management.    Carmin Muskrat, MD 02/10/16 1536

## 2016-02-10 NOTE — Progress Notes (Signed)
Pt with complaint of sob. Pt given prn neb treatment and placed on 2l [place].provider notified.

## 2016-02-10 NOTE — ED Triage Notes (Signed)
Patient comes in per [facility] with c/o chest pain that increases with inspiration. Cardiac stents placed 4 wks ago. Cp for 2 days. Cp is worse than 4 wks ago but cp increasing with inspiration is new. Productive cough with brown sputum. hx current smoker, htn, DM. Patient took Nitro x1, 325 mg aspirin at home. And EMS gave nitro x2 without much relief. Pain went from 8 to 7 post nitro. IV 20 LH. Ems BP 130/90, 82 HR, 90% RA and 96% on 4L. fsbs 128. EKG unremarkable.

## 2016-02-10 NOTE — ED Notes (Signed)
PER EDP, patient can take his meds that he has with him.

## 2016-02-11 ENCOUNTER — Encounter (HOSPITAL_COMMUNITY): Payer: Self-pay | Admitting: General Practice

## 2016-02-11 DIAGNOSIS — R918 Other nonspecific abnormal finding of lung field: Secondary | ICD-10-CM | POA: Diagnosis not present

## 2016-02-11 DIAGNOSIS — Z8249 Family history of ischemic heart disease and other diseases of the circulatory system: Secondary | ICD-10-CM

## 2016-02-11 DIAGNOSIS — Z79899 Other long term (current) drug therapy: Secondary | ICD-10-CM

## 2016-02-11 DIAGNOSIS — J101 Influenza due to other identified influenza virus with other respiratory manifestations: Secondary | ICD-10-CM | POA: Diagnosis not present

## 2016-02-11 DIAGNOSIS — I5032 Chronic diastolic (congestive) heart failure: Secondary | ICD-10-CM

## 2016-02-11 DIAGNOSIS — J441 Chronic obstructive pulmonary disease with (acute) exacerbation: Secondary | ICD-10-CM | POA: Diagnosis not present

## 2016-02-11 DIAGNOSIS — I2510 Atherosclerotic heart disease of native coronary artery without angina pectoris: Secondary | ICD-10-CM

## 2016-02-11 DIAGNOSIS — E119 Type 2 diabetes mellitus without complications: Secondary | ICD-10-CM

## 2016-02-11 DIAGNOSIS — I5189 Other ill-defined heart diseases: Secondary | ICD-10-CM

## 2016-02-11 DIAGNOSIS — R0789 Other chest pain: Secondary | ICD-10-CM | POA: Diagnosis not present

## 2016-02-11 DIAGNOSIS — Z823 Family history of stroke: Secondary | ICD-10-CM

## 2016-02-11 DIAGNOSIS — Z955 Presence of coronary angioplasty implant and graft: Secondary | ICD-10-CM

## 2016-02-11 DIAGNOSIS — I700 Atherosclerosis of aorta: Secondary | ICD-10-CM

## 2016-02-11 DIAGNOSIS — Z7982 Long term (current) use of aspirin: Secondary | ICD-10-CM

## 2016-02-11 DIAGNOSIS — Z825 Family history of asthma and other chronic lower respiratory diseases: Secondary | ICD-10-CM

## 2016-02-11 DIAGNOSIS — I110 Hypertensive heart disease with heart failure: Secondary | ICD-10-CM

## 2016-02-11 DIAGNOSIS — Z7951 Long term (current) use of inhaled steroids: Secondary | ICD-10-CM

## 2016-02-11 DIAGNOSIS — F17210 Nicotine dependence, cigarettes, uncomplicated: Secondary | ICD-10-CM

## 2016-02-11 DIAGNOSIS — Z800 Family history of malignant neoplasm of digestive organs: Secondary | ICD-10-CM

## 2016-02-11 DIAGNOSIS — Z836 Family history of other diseases of the respiratory system: Secondary | ICD-10-CM

## 2016-02-11 LAB — CBC
HCT: 40.1 % (ref 39.0–52.0)
Hemoglobin: 13.7 g/dL (ref 13.0–17.0)
MCH: 28.4 pg (ref 26.0–34.0)
MCHC: 34.2 g/dL (ref 30.0–36.0)
MCV: 83 fL (ref 78.0–100.0)
Platelets: 214 10*3/uL (ref 150–400)
RBC: 4.83 MIL/uL (ref 4.22–5.81)
RDW: 15 % (ref 11.5–15.5)
WBC: 5 10*3/uL (ref 4.0–10.5)

## 2016-02-11 LAB — BASIC METABOLIC PANEL
Anion gap: 10 (ref 5–15)
BUN: 16 mg/dL (ref 6–20)
CO2: 24 mmol/L (ref 22–32)
Calcium: 8.6 mg/dL — ABNORMAL LOW (ref 8.9–10.3)
Chloride: 101 mmol/L (ref 101–111)
Creatinine, Ser: 1.02 mg/dL (ref 0.61–1.24)
GFR calc Af Amer: >60 mL/min (ref >60)
GFR calc non Af Amer: >60 mL/min (ref >60)
Glucose, Bld: 195 mg/dL — ABNORMAL HIGH (ref 65–99)
Potassium: 3.8 mmol/L (ref 3.5–5.1)
Sodium: 135 mmol/L (ref 135–145)

## 2016-02-11 LAB — TROPONIN I
Troponin I: <0.03 ng/mL (ref <0.03)
Troponin I: <0.03 ng/mL

## 2016-02-11 LAB — GLUCOSE, CAPILLARY
Glucose-Capillary: 170 mg/dL — ABNORMAL HIGH (ref 65–99)
Glucose-Capillary: 160 mg/dL — ABNORMAL HIGH (ref 65–99)
Glucose-Capillary: 191 mg/dL — ABNORMAL HIGH (ref 65–99)
Glucose-Capillary: 207 mg/dL — ABNORMAL HIGH (ref 65–99)

## 2016-02-11 MED ORDER — ENSURE ENLIVE PO LIQD
237.0000 mL | Freq: Two times a day (BID) | ORAL | Status: DC
  Administered 2016-02-11 – 2016-02-12 (×2): 237 mL via ORAL

## 2016-02-11 MED ORDER — ALUM & MAG HYDROXIDE-SIMETH 200-200-20 MG/5ML PO SUSP
30.0000 mL | Freq: Four times a day (QID) | ORAL | Status: DC | PRN
  Filled 2016-02-11: qty 30

## 2016-02-11 MED ORDER — PSEUDOEPHEDRINE HCL ER 120 MG PO TB12
120.0000 mg | ORAL_TABLET | Freq: Two times a day (BID) | ORAL | Status: DC
  Administered 2016-02-11 – 2016-02-12 (×3): 120 mg via ORAL
  Filled 2016-02-11 (×5): qty 1

## 2016-02-11 MED ORDER — AZITHROMYCIN 250 MG PO TABS
500.0000 mg | ORAL_TABLET | Freq: Every day | ORAL | Status: AC
  Administered 2016-02-11: 500 mg via ORAL
  Filled 2016-02-11 (×2): qty 2

## 2016-02-11 MED ORDER — IPRATROPIUM-ALBUTEROL 0.5-2.5 (3) MG/3ML IN SOLN
3.0000 mL | Freq: Four times a day (QID) | RESPIRATORY_TRACT | Status: DC
Start: 2016-02-11 — End: 2016-02-12
  Administered 2016-02-11 – 2016-02-12 (×6): 3 mL via RESPIRATORY_TRACT
  Filled 2016-02-11 (×6): qty 3

## 2016-02-11 MED ORDER — AZITHROMYCIN 250 MG PO TABS
250.0000 mg | ORAL_TABLET | Freq: Every day | ORAL | Status: DC
  Administered 2016-02-12: 250 mg via ORAL
  Filled 2016-02-11: qty 1

## 2016-02-11 MED ORDER — PREDNISONE 20 MG PO TABS
40.0000 mg | ORAL_TABLET | Freq: Every day | ORAL | Status: DC
  Administered 2016-02-11 – 2016-02-12 (×2): 40 mg via ORAL
  Filled 2016-02-11 (×3): qty 2

## 2016-02-11 NOTE — Care Management Obs Status (Signed)
[facility] NOTIFICATION   Patient Details  Name: Jonathon [name] MRN: [id number] Date of Birth: 08/07/1950   Medicare Observation Status Notification Given:       Bethena Roys, RN 02/11/2016, 5:05 PM

## 2016-02-11 NOTE — H&P (Signed)
Internal Medicine Attending Admission Note  I saw and evaluated the patient. I reviewed the resident's note and I agree with the resident's findings and plan as documented in the resident's note.  Assessment & Plan by Problem:  Active Problems:   COPD exacerbation due to Influenza A - Agree with 5 day course of prednisone, azithromycin, tamiflu - Possible discharge tomorrow. -Wean oxygen, ambulate.    Abnormal CT of the chest, Pulmonary mass - As described by Dr Hetty Ely, pulmonary nodule has increased in size although had previously been present for a long peorid of time.  Will need outpatient PET CT    Chronic Diastolic heart failure (HCC) - Euvolemic on exam    Chest pain / Coronary artery disease with recent DES to RCA - Serial troponin negative, no concerning EKG changes>> ACS ruled out - Chest pain likely secondary to myalgias from flu  Chief Complaint(s): cough  History - key components related to admission:Briefly Author A Moya is a 66 yo male with history of COPD, tobacco abuse, CAD s/p DES to RCA 12/17), T2DM, HTN, dCHF who presents with CC of cough.  Notes that a friend of his he saw 2 days ago was sick with cough and congestion.  While he has a chronic cough his cough over the last 2 days has become worse and consistency has changed to brown sputum.  He has associated SOB, wheezing, fever, and chills.  In addition he describes chest pain and myalgias, chest pain starts on right side and goes to the left, worsened by cough, has not significantly improved since he has been here.  In the ED for some reason PE was considered, a D-dimer was checked and was found to be elevated therefore a CTA of the chest was obtained which ruled out PE but found that a chronic left lingular mass has suddenly grown in size since last imaging.  He was admitted for ACS rule out and further workup.  Influenza testing was positive for Influenza A. Of note he does report compliance with ASA and  brilinta.  Lab results: Reviewed in Epic  Physical Exam - key components related to admission: General: resting in bed HEENT: EOMI, no scleral icterus Cardiac: RRR, no rubs, murmurs or gallops Pulm: good air movement, mild scattered wheezes Abd: soft, nontender, nondistended, BS present Ext: warm and well perfused, no pedal edema Neuro: alert and oriented X4  Vitals:   02/11/16 0917 02/11/16 0921 02/11/16 0932 02/11/16 1242  BP:   119/74   Pulse:      Resp:      Temp:      TempSrc:      SpO2: 97% 97%  98%  Weight:      Height:

## 2016-02-11 NOTE — Progress Notes (Signed)
Patient admitted with Chest Pain.  Primary Cardiologist is Dr. [name].  Dr. Hetty Ely notified and stated patient did not need cardiology consulted and could eat breakfast.  Primary RN Clarene Critchley notified.  Sanda Linger

## 2016-02-11 NOTE — Progress Notes (Signed)
Subjective: Jonathon [name] was experiencing significant shortness of breath when we woke him up suddenly during rounds this morning. He sat up and felt some improvement in his breathing, SpO2 remained >97% during this episode of difficulty breathing.   Objective:  Vital signs in last 24 hours: Vitals:   02/11/16 0810 02/11/16 0917 02/11/16 0921 02/11/16 0932  BP: (!) 136/113   119/74  Pulse:      Resp:      Temp:      TempSrc:      SpO2:  97% 97%   Weight:      Height:       Physical Exam  Constitutional: He is oriented to person, place, and time. He appears well-developed and well-nourished.  HENT:  Head: Normocephalic and atraumatic.  Cardiovascular: Normal rate and regular rhythm.   No murmur heard. Pulmonary/Chest: He has wheezes.  Speaking in full sentences  Increased work of breathing   Abdominal: Soft. He exhibits no distension. There is no tenderness.  Neurological: He is alert and oriented to person, place, and time.  Skin: Skin is warm and dry. He is not diaphoretic.     Assessment/Plan: Pt is a 66 y.o. yo male with a PMHx of COPD GOLD II, coronary artery disease (s/p DES in distal RCA 12/17), type 2 DM, hypertension, tobacco use, diastolic dysfunction who was admitted on 02/10/2016 with symptoms of cough and chest pain, which was determined to be secondary to influenza and mild COPD exacerbation.   Active Problems:   Essential hypertension, benign   Smoker   COPD GOLD II with reversibility    Abnormal CT of the chest   Diastolic heart failure (HCC)   Chest pain   Coronary artery disease   COPD exacerbation (HCC)   Influenza A  COPD exacerbation  Influenza A infection  His sensation of difficulty breathing has continued overnight but he has maintained oxygen saturation >94% on 4 L O2 overnight. Nasal canula was turned down to 1 liter during round this morning and his SpO2 remained >97%. Will need to continue weaning him to room air as tolerated with  goal SpO2 >94%. Pulmonary exam is still without crackles but he has slight wheeze today. We will continue treating for influenza A infection and suspected COPD exacerbation.  -wean oxygen to room air - ordered prednisone 40 mg (day 2/5) and azithromycin (day 1/5)  -continue home meds symbicort inhaler and albuterol nebs - continue tamiflu (day 2/5)  - continue normal saline 75 cc/hr and zofran     Chest pain   Coronary artery disease EKG and troponin <0.03 x 3 overnight are reassuring that this chest pain is not likely ischemic. Will continue to monitor  -continue home meds asa 325 mg qd and ticagrelor 90 mg BID, atorvastatin 80 mg qd, bisoprolol 5 mg qd,     Essential hypertension, benign remains normotensive  -continue home meds amlodipine 10 mg qd, clonidine 0.3 mg bid, irbesartan 150 mg qd    Smoker -continue nicotine patch     Diastolic dysfunction (HCC) Remains euvolemic on exam.   Type 2 DM A1c 2015 6.7. Home medication is metformin.  - CBG monitoring  -ordered ISS sensitive   Abnormal CT of the chest Lingular pulmonary nodule  CT angio performed in the ED noted a nodular pleural lesion in the lingula which is increased in size from 2.2 x 2.5 cm on CT chest 06/2014 to 5.3 X 2.5 cm yesterday. He also has increased hilar and  mediastinal adenopathy.Radiology recommended further evaluation with PET CT.    Aortic atherosclerosis  Noted on CT scan. No signs of thrombosis or claudication. Will need continued monitoring outpatient.    Dispo: Anticipated discharge in approximately 1-2 day(s).   Jonathon Noss, MD 02/11/2016, 9:59 AM Pager: [phone]

## 2016-02-12 DIAGNOSIS — R0789 Other chest pain: Secondary | ICD-10-CM | POA: Diagnosis not present

## 2016-02-12 DIAGNOSIS — J101 Influenza due to other identified influenza virus with other respiratory manifestations: Secondary | ICD-10-CM | POA: Diagnosis not present

## 2016-02-12 DIAGNOSIS — J441 Chronic obstructive pulmonary disease with (acute) exacerbation: Secondary | ICD-10-CM | POA: Diagnosis not present

## 2016-02-12 LAB — BASIC METABOLIC PANEL
Anion gap: 6 (ref 5–15)
BUN: 10 mg/dL (ref 6–20)
CO2: 26 mmol/L (ref 22–32)
Calcium: 8.2 mg/dL — ABNORMAL LOW (ref 8.9–10.3)
Chloride: 105 mmol/L (ref 101–111)
Creatinine, Ser: 0.82 mg/dL (ref 0.61–1.24)
GFR calc Af Amer: >60 mL/min (ref >60)
GFR calc non Af Amer: >60 mL/min (ref >60)
Glucose, Bld: 122 mg/dL — ABNORMAL HIGH (ref 65–99)
Potassium: 2.9 mmol/L — ABNORMAL LOW (ref 3.5–5.1)
Sodium: 137 mmol/L (ref 135–145)

## 2016-02-12 LAB — GLUCOSE, CAPILLARY
Glucose-Capillary: 138 mg/dL — ABNORMAL HIGH (ref 65–99)
Glucose-Capillary: 165 mg/dL — ABNORMAL HIGH (ref 65–99)

## 2016-02-12 LAB — POTASSIUM: Potassium: 3.6 mmol/L (ref 3.5–5.1)

## 2016-02-12 MED ORDER — PREDNISONE 20 MG PO TABS: 40.0000 mg | ORAL_TABLET | Freq: Every day | ORAL | 0 refills | Status: DC

## 2016-02-12 MED ORDER — OSELTAMIVIR PHOSPHATE 75 MG PO CAPS: 75.0000 mg | ORAL_CAPSULE | Freq: Two times a day (BID) | ORAL | 0 refills | Status: DC

## 2016-02-12 MED ORDER — POTASSIUM CHLORIDE CRYS ER 20 MEQ PO TBCR
40.0000 meq | EXTENDED_RELEASE_TABLET | ORAL | Status: AC
  Administered 2016-02-12: 40 meq via ORAL
  Filled 2016-02-12: qty 2

## 2016-02-12 MED ORDER — POTASSIUM CHLORIDE CRYS ER 20 MEQ PO TBCR
40.0000 meq | EXTENDED_RELEASE_TABLET | Freq: Two times a day (BID) | ORAL | Status: DC
  Administered 2016-02-12: 40 meq via ORAL
  Filled 2016-02-12: qty 2

## 2016-02-12 MED ORDER — POTASSIUM CHLORIDE CRYS ER 20 MEQ PO TBCR: 40.0000 meq | EXTENDED_RELEASE_TABLET | Freq: Two times a day (BID) | ORAL | Status: DC

## 2016-02-12 MED ORDER — AZITHROMYCIN 250 MG PO TABS: 250.0000 mg | ORAL_TABLET | Freq: Every day | ORAL | 0 refills | Status: DC

## 2016-02-12 MED ORDER — SODIUM CHLORIDE 0.9 % IV SOLN
30.0000 meq | Freq: Once | INTRAVENOUS | Status: AC
  Administered 2016-02-12: 30 meq via INTRAVENOUS
  Filled 2016-02-12: qty 15

## 2016-02-12 NOTE — Progress Notes (Signed)
Discharge summary, follow up appointments and medications were discussed with pt and spouse. Pt and spouse verbalized understanding. Pt denies complaints of pain or shortness of breath. Pt appropriate to discharge at this time. Accompanied by RN and spouse.

## 2016-02-12 NOTE — Progress Notes (Signed)
Internal Medicine Attending:   I saw and examined the patient. I reviewed the resident's note and I agree with the resident's findings and plan as documented in the resident's note. Jonathon [name] is feeling better this morning, no longer requiring supplemental O2, productive cough has decreased.  Nursing noted he was able to get up to bathroom and move around without major desaturation.  May benefit from physical therapy for some weakness, he absolutely reports he will no go to SNF.  Therefore we will discharge him today to home and obtain home health physical therapy to help increase his strength.

## 2016-02-12 NOTE — Discharge Summary (Signed)
Name: Jonathon [name] MRN: [id number] DOB: 1950/09/02 66 y.o. PCP: [name], MD  Date of Admission: 02/10/2016  8:12 AM Date of Discharge: 02/12/2016 Attending Physician: Lucious Groves, DO  Discharge Diagnosis: 1.    COPD exacerbation ([place])   Influenza A  Discharge Medications: Allergies as of 02/12/2016   No Known Allergies     Medication List    STOP taking these medications   clopidogrel 75 MG tablet Commonly known as:  PLAVIX     TAKE these medications   albuterol (2.5 MG/3ML) 0.083% nebulizer solution Commonly known as:  PROVENTIL Take 2.5 mg by nebulization every 4 (four) hours as needed for wheezing or shortness of breath.   Albuterol Sulfate 108 (90 Base) MCG/ACT Aepb Commonly known as:  PROAIR RESPICLICK Inhale 2 puffs into the lungs 4 (four) times daily as needed.   amLODipine 10 MG tablet Commonly known as:  NORVASC Take 10 mg by mouth every morning.   aspirin EC 325 MG tablet Take 650 mg by mouth every morning.   atorvastatin 80 MG tablet Commonly known as:  LIPITOR Take 1 tablet (80 mg total) by mouth every morning. Take 1/2 tablet by mouth every morning What changed:  how much to take  additional instructions   azithromycin 250 MG tablet Commonly known as:  ZITHROMAX Take 1 tablet (250 mg total) by mouth daily. Stop taking 02/15/2016   bisoprolol 5 MG tablet Commonly known as:  ZEBETA Take 1 tablet (5 mg total) by mouth daily.   budesonide-formoterol 160-4.5 MCG/ACT inhaler Commonly known as:  SYMBICORT Inhale 2 puffs into the lungs 2 (two) times daily.   cloNIDine 0.3 MG tablet Commonly known as:  CATAPRES Take 0.3 mg by mouth 2 (two) times daily.   cyclobenzaprine 5 MG tablet Commonly known as:  FLEXERIL Take 5 mg by mouth at bedtime.   DULoxetine 30 MG capsule Commonly known as:  CYMBALTA Take 30 mg by mouth every morning.   gabapentin 400 MG capsule Commonly known as:  NEURONTIN Take 400 mg by mouth 3 (three) times  daily.   irbesartan 150 MG tablet Commonly known as:  AVAPRO TAKE ONE (1) TABLET BY MOUTH EVERY DAY What changed:  See the new instructions.   metFORMIN 500 MG tablet Commonly known as:  GLUCOPHAGE Take 500 mg by mouth daily with breakfast.   nicotine 21 mg/24hr patch Commonly known as:  NICODERM CQ - dosed in mg/24 hours Place 1 patch (21 mg total) onto the skin daily.   nitroGLYCERIN 0.4 MG SL tablet Commonly known as:  NITROSTAT Place 0.4 mg under the tongue every 5 (five) minutes as needed for chest pain.   nortriptyline 25 MG capsule Commonly known as:  PAMELOR Take 25 mg by mouth at bedtime.   omeprazole 40 MG capsule Commonly known as:  PRILOSEC Take 40 mg by mouth 2 (two) times daily.   oseltamivir 75 MG capsule Commonly known as:  TAMIFLU Take 1 capsule (75 mg total) by mouth 2 (two) times daily. Stop taking 02/14/2016   predniSONE 20 MG tablet Commonly known as:  DELTASONE Take 2 tablets (40 mg total) by mouth daily with breakfast. Stop taking 02/14/2016   ticagrelor 90 MG Tabs tablet Commonly known as:  BRILINTA Take 1 tablet (90 mg total) by mouth 2 (two) times daily.   VITAMIN B 12 PO Take 1 tablet by mouth every morning.       Disposition and follow-up:   Jonathon [name] was discharged from  [facility] in Stable condition.  At the hospital follow up visit please address:  1.  COPD exacerbation - Has dyspnea improved? Was [facility] PT successfully set up?   Abnormal CT chest finding (see below) - Consider PET CT for further evaluation.   2.  Labs / imaging needed at time of follow-up: PET CT, CMP   3.  Pending labs/ test needing follow-up: none   Follow-up Appointments: Follow-up Information    BOUSKA,DAVID E, MD. Schedule an appointment as soon as possible for a visit in 1 week(s).   Specialty:  Family Medicine Contact information: [address] [place] [place] [address] [place] Follow up.   Specialty:  Home Health Services Why:  Registered Nurse and Physical Therapy-Office to call by Monday and hope to visit by Tuesday.  Contact information: [address] [place] [address] [phone]           Hospital Course by problem list:    COPD exacerbation ([place])   COPD GOLD II with reversibility    Influenza A 66 y.o. male with a PMH of COPD GOLD II, coronary artery disease (s/p DES in distal RCA 12/17), type 2 DM, hypertension, tobacco use, diastolic dysfunction who presents with cough with increased purulent sputum production, wheeze, and chills. When seen in the ED he had a temp of 100.2 and had SpO2 96% on 1 liter. He was speaking in full sentences and had no wheezes or rales on pulmonary exam. Labs revealed D-dimer 1.23, Influenza A PCR positive. CXR revealed chronic lung disease without consolidation. He was started on tamiflu, azithromycin, steroid taper, albuterol nebs, and Symbicort inhaler. He was weaned off of nasal canula on the third day of hospitalization. He completed 3 days of prednisone and tamiflu and was discharged with Rx to complete 2 more days of these. He completed two days of azithromycin and was discharged with Rx to complete 3 more days of this. He described shortness of breath with ambulation at baseline at home so was referred for [facility] PT at the time of discharge. He had SpO2 98% on room air and hemodynamically stable vitals on the day of discharge.     Essential hypertension, benign Remained normotensive on home meds.     Smoker   Abnormal CT of the chest CT angio performed in the ED noted a nodular pleural lesion in the lingula which is increased in size from 2.2 x 2.5 cm on CT chest 06/2014 to 5.3 X 2.5 cm yesterday. He also has increased hilar and mediastinal adenopathy.Radiology recommended further evaluation with PET CT.      Chest pain   Coronary artery disease At time of presentation he described right  sided chest pain which was worse with exertion and coughing but improved with rest. The chest pain was not relieved with nitroglycerine. Labs revealed trop I negative x3. EKG showed T wave inversions and flattening on prior EKG during hospitalization for STEMI had improved. The proximity of this chest pain after recent DES 12/17 was concerning for restenosis but chest pain description was more consistent with pleuritic chest pain than acute coronary syndrome and CP resolved without intervention on the third day of admission.     Aortic atherosclerosis ([place]) Noted on CT scan. No signs of thrombosis or claudication. Will need continued monitoring outpatient.    Discharge Vitals:   BP (!) 149/70 (BP Location:  Right Arm)   Pulse 67   Temp 97.6 F (36.4 C) (Oral)   Resp 18   Ht 6' (1.829 m)   Wt 207 lb 6.4 oz (94.1 kg)   SpO2 96%   BMI 28.13 kg/m   Pertinent Labs, Studies, and Procedures:  Procedures Performed:  Ct Angio Chest Pe W/cm &/or Wo Cm  Result Date: 02/10/2016 CLINICAL DATA:  66 year old male with history of shortness of breath and chest pain for the past 2 days. Elevated D-dimer. EXAM: CT ANGIOGRAPHY CHEST WITH CONTRAST TECHNIQUE: Multidetector CT imaging of the chest was performed using the standard protocol during bolus administration of intravenous contrast. Multiplanar CT image reconstructions and MIPs were obtained to evaluate the vascular anatomy. CONTRAST:  80 mL of Isovue 370. COMPARISON:  Chest CT 06/27/2014. FINDINGS: Cardiovascular: There are no filling defects within the pulmonary arterial tree to suggest underlying pulmonary embolism. Heart size is normal. There is no significant pericardial fluid, thickening or pericardial calcification. There is aortic atherosclerosis, as well as atherosclerosis of the great vessels of the mediastinum and the coronary arteries, including calcified atherosclerotic plaque in the left main, left circumflex and right coronary arteries.  Mediastinum/Nodes: Several prominent borderline enlarged and mildly enlarged mediastinal and hilar lymph nodes are noted. Amongst these, the largest include a 12 mm short axis right hilar lymph node, 11 mm short axis subcarinal lymph node, and 12 mm short axis high right paratracheal lymph node. Small hiatal hernia. No axillary lymphadenopathy. Lungs/Pleura: Diffuse bronchial wall thickening with severe centrilobular and paraseptal emphysema. There is again a pleural-based lesion along the medial aspect of the lingula which demonstrates a nodular component which is very similar to the prior study measuring 2.2 x 2.5 cm (image 96 of series 5). However, there is thickening of soft tissues along the medial margin of this area, best appreciated on image 95 of series 4, which appear increased compared to the prior study. When measured on image 95 of series 4 of today's examination, the overall dimensions of this lesion are now 5.3 x 2.5 cm, such that some interval growth is suspected. Whether or not this represents progression of rounded atelectasis, or is indicative of a slow-growing neoplasm is uncertain. No acute consolidative airspace disease. No pleural effusions. Right-sided Bochdalek's hernia again noted. Upper Abdomen: 2.0 cm low-attenuation lesion in the upper pole of the left kidney is incompletely characterized on today's noncontrast CT examination, but is similar to prior studies, likely a cyst. Aortic atherosclerosis. Musculoskeletal: There are no aggressive appearing lytic or blastic lesions noted in the visualized portions of the skeleton. Review of the MIP images confirms the above findings. IMPRESSION: 1. No evidence of pulmonary embolism. 2. No acute findings in the thorax. 3. However, there appears to be potential growth of the previously noted lingular lesion. Overall, this lesion had been essentially stable over numerous prior examinations dating back to 2011, suspected to represent benign process  such as rounded atelectasis, however, there is soft tissue thickening along the medial margin on today's examination. Whether this simply represents progression of rounded atelectasis or is indicative of a very slow-growing neoplasm is uncertain, however, further evaluation with PET-CT is recommended in the near future to better evaluate this finding. 4. Increasing mediastinal and right hilar lymphadenopathy is also noted compared to prior examinations, also better evaluated on PET-CT. 5. Aortic atherosclerosis, in addition to left main and 2 vessel coronary artery disease. Please note that although the presence of coronary artery calcium documents the presence of coronary artery  disease, the severity of this disease and any potential stenosis cannot be assessed on this non-gated CT examination. Assessment for potential risk factor modification, dietary therapy or pharmacologic therapy may be warranted, if clinically indicated. 6. Additional incidental findings, as above. Electronically Signed   By: Vinnie Langton [name]   On: 02/10/2016 11:14   Dg Chest Portable 1 View  Result Date: 02/10/2016 CLINICAL DATA:  Chest pain.  Shortness of breath . EXAM: PORTABLE CHEST 1 VIEW COMPARISON:  12/05/ 2017.  01/09/2015 . FINDINGS: Mediastinum and hilar structures are normal. Cardiomegaly. Basilar interstitial prominence. A component mild basilar interstitial edema and/or pneumonitis cannot be excluded. Small bilateral pleural effusions cannot be excluded. Severe bullous COPD . IMPRESSION: 1. Cardiomegaly with basilar interstitial prominence. A component of CHF cannot be excluded. Small bilateral pleural effusions. 2. COPD . Electronically Signed   By: Marcello Moores  Register   On: 02/10/2016 08:59    Discharge Instructions: Discharge Instructions    Call MD for:  difficulty breathing, headache or visual disturbances    Complete by:  As directed    Call MD for:  temperature >100.4    Complete by:  As directed    Diet -  low sodium heart healthy    Complete by:  As directed    Discharge instructions    Complete by:  As directed    Thank you for trusting [place] with your medical care!  You were hospitalized for the flu and COPD exacerbation and treated with antibiotics, steroids, and antiviral medication for the flu.   Please take note of the following changes to your medications: START taking azithromycin once daily until you complete the pills 1/29  START taking tamiflu once daily until you complete the pills 1/28  START taking prednisone once daily until you complete the pills 1/28   To make sure you are getting better, please make it to the follow-up appointments listed on the first page.  If you have any questions, please call [phone].   Increase activity slowly    Complete by:  As directed       Signed: Ledell Noss, MD 02/22/2016, 8:55 AM   Pager: [phone]

## 2016-02-12 NOTE — Progress Notes (Signed)
Subjective: Jonathon [name] is feeling somewhat better today. He is no longer feeling short of breath but does feel like he has difficulty breathing when he ambulates. He had this same difficulty prior to the development of his flu symptoms. He has agreed to working with PT and OT at home.   Objective:  Vital signs in last 24 hours: Vitals:   02/11/16 1638 02/11/16 2004 02/11/16 2013 02/12/16 0331  BP:  (!) 121/54  (!) 157/82  Pulse:  (!) 57  84  Resp:  18  20  Temp:  98.1 F (36.7 C)  97.9 F (36.6 C)  TempSrc:  Oral  Oral  SpO2: 97% 98% 97% 98%  Weight:    207 lb 6.4 oz (94.1 kg)  Height:       Physical Exam  Constitutional: He is oriented to person, place, and time. He appears well-developed and well-nourished.  HENT:  Head: Normocephalic and atraumatic.  Cardiovascular: Normal rate and regular rhythm.   No murmur heard. Pulmonary/Chest: Effort normal and breath sounds normal. No respiratory distress. He has no wheezes. He has no rales.  Speaking in full sentences   Abdominal: Soft. He exhibits no distension. There is no tenderness.  Neurological: He is alert and oriented to person, place, and time.  Skin: Skin is warm and dry. He is not diaphoretic.     Assessment/Plan: Pt is a 66 y.o. yo male with a PMHx of COPD GOLD II, coronary artery disease (s/p DES in distal RCA 12/17), type 2 DM, hypertension, tobacco use, diastolic dysfunction who was admitted on 02/10/2016 with symptoms of cough and chest pain, which was determined to be secondary to influenza and mild COPD exacerbation.   Principal Problem:   COPD exacerbation ([place]) Active Problems:   Essential hypertension, benign   Smoker   COPD GOLD II with reversibility    Abnormal CT of the chest   Diastolic heart failure ([facility])   Chest pain   Coronary artery disease   Influenza A   Diastolic dysfunction   Aortic atherosclerosis ([facility])  COPD exacerbation  Influenza A infection  Maintaining SpO2 97% on room  air at rest, SpO2 94% when ambulating with his nurse on room air but had to stop secondary to deconditioning and lower extremity weakness. Has agreed to work with PT and OT at home for continued recovery. Wheezing and appearance of increased work of breathing have resolved. Will continue treating for influenza A infection and suspected COPD exacerbation outpatient.  - prednisone 40 mg (day 3/5) and azithromycin (day 2/5)  -continue home meds symbicort inhaler and albuterol nebs - continue tamiflu (day 3/5)  - continue normal saline 75 cc/hr and zofran     Chest pain   Coronary artery disease Resolved -continue home meds asa 325 mg qd and ticagrelor 90 mg BID, atorvastatin 80 mg qd, bisoprolol 5 mg qd  -plavix was included in his home med list at admission but based on discharge summary from that hospitalization he should be taking aspirin and ticagrelor so I will D/C it at discharge    Essential hypertension, benign -continue home meds amlodipine 10 mg qd, clonidine 0.3 mg bid, irbesartan 150 mg qd    Smoker -continue nicotine patch     Diastolic dysfunction ([facility]) Remains euvolemic on exam.   Type 2 DM A1c 2015 6.7. Home medication is metformin.  -will resume home meds at discharge   Abnormal CT of the chest Lingular pulmonary nodule  CT angio performed in the  ED noted a nodular pleural lesion in the lingula which is increased in size from 2.2 x 2.5 cm on CT chest 06/2014 to 5.3 X 2.5 cm yesterday. He also has increased hilar and mediastinal adenopathy. Radiology recommended further evaluation with PET CT, this will need to be arranged at outpatient follow up.   Aortic atherosclerosis  Noted on CT scan. No signs of thrombosis or claudication. Will need continued monitoring outpatient.    Dispo: Anticipated discharge today.   Ledell Noss, MD 02/12/2016, 11:45 AM Pager: [phone]

## 2016-02-12 NOTE — Progress Notes (Signed)
SATURATION QUALIFICATIONS: (This note is used to comply with regulatory documentation for home oxygen)  Patient Saturations on Room Air at Rest = 94%  Patient Saturations on Room Air while Ambulating = 94%  Please briefly explain why patient needs home oxygen:  Dazaria Macneill, Tivis Ringer, RN

## 2016-02-12 NOTE — Care Management Obs Status (Deleted)
[place] NOTIFICATION   Patient Details  Name: TACARI REPASS MRN: [id number] Date of Birth: [birth year]   Medicare Observation Status Notification Given:  Yes    Bethena Roys, RN 02/12/2016, 1:58 PM

## 2016-02-12 NOTE — Care Management Note (Signed)
Case Management Note  Patient Details  Name: Jonathon [name] MRN: [id number] Date of Birth: Jun 07, 1950  Subjective/Objective:  Pt presented for COPD Exacerbation. Pt is from home with wife. Plan to return home with [place].                  Action/Plan: Agency List Provided and pt chose [facility] for [facility]. Referral made to [facility] and [facility] will begin by Tuesday. CM did make MD Hetty Ely aware of start date. No DME needed at this time. No further needs from CM at this time.    Expected Discharge Date:                  Expected Discharge Plan:  [place]  In-House Referral:  NA  Discharge planning Services  CM Consult  Post Acute Care Choice:  Home Health Choice offered to:  Patient  DME Arranged:  [name] DME Agency:  NA  HH Arranged:  RN, PT [place] Agency:  [facility] (now [facility])  Status of Service:  Completed, signed off  If discussed at [place] of Stay Meetings, dates discussed:    Additional Comments:  Bethena Roys, RN 02/12/2016, 2:45 PM

## 2016-02-12 NOTE — Progress Notes (Signed)
Inpatient Diabetes Program Recommendations  AACE/ADA: New Consensus Statement on Inpatient Glycemic Control (2015)  Target Ranges:  Prepandial:   less than 140 mg/dL      Peak postprandial:   less than 180 mg/dL (1-2 hours)      Critically ill patients:  140 - 180 mg/dL   Results for [name], [name] (MRN [id number]) as of 02/12/2016 09:41  Ref. Range 02/11/2016 07:34 02/11/2016 11:18 02/11/2016 16:32 02/11/2016 21:36  Glucose-Capillary Latest Ref Range: 65 - 99 mg/dL 170 (H) 160 (H) 191 (H) 207 (H)   Results for [name], [name] (MRN [id number]) as of 02/12/2016 09:41  Ref. Range 02/12/2016 07:36  Glucose-Capillary Latest Ref Range: 65 - 99 mg/dL 138 (H)    Admit CP  History: DM, CHF, CVA  Home DM Meds: Metformin 500 mg daily  Current Orders: Novolog Sensitive Correction Scale/ SSI (0-9 units) TID AC        MD- Note patient currently getting Prednisone 40 mg daily.  Having occasional glucose elevations.  Please consider increasing Novolog Correction Scale/ SSI to Moderate scale (0-15 units) TID AC + HS while patient getting Prednisone.      --Will follow patient during hospitalization--  Wyn Quaker RN, MSN, CDE Diabetes Coordinator Inpatient Glycemic Control Team Team Pager: [phone] (8a-5p)

## 2016-03-14 ENCOUNTER — Encounter (HOSPITAL_COMMUNITY): Payer: Self-pay | Admitting: Family Medicine

## 2016-03-14 NOTE — Progress Notes (Signed)
Mailed letter with [facility] along my chart message... KJ

## 2016-05-26 ENCOUNTER — Other Ambulatory Visit: Payer: Self-pay | Admitting: Family Medicine

## 2016-05-31 ENCOUNTER — Other Ambulatory Visit: Payer: Self-pay | Admitting: Family Medicine

## 2016-05-31 DIAGNOSIS — Z136 Encounter for screening for cardiovascular disorders: Secondary | ICD-10-CM

## 2016-06-06 ENCOUNTER — Ambulatory Visit: Payer: Medicare Other

## 2016-06-27 ENCOUNTER — Ambulatory Visit: Payer: Medicare Other

## 2017-06-02 ENCOUNTER — Other Ambulatory Visit: Payer: Self-pay

## 2017-06-02 ENCOUNTER — Encounter (HOSPITAL_COMMUNITY): Payer: Self-pay | Admitting: Emergency Medicine

## 2017-06-02 ENCOUNTER — Emergency Department (HOSPITAL_COMMUNITY): Payer: Medicare Other

## 2017-06-02 DIAGNOSIS — Z5321 Procedure and treatment not carried out due to patient leaving prior to being seen by health care provider: Secondary | ICD-10-CM | POA: Insufficient documentation

## 2017-06-02 DIAGNOSIS — R079 Chest pain, unspecified: Secondary | ICD-10-CM | POA: Diagnosis present

## 2017-06-02 LAB — BASIC METABOLIC PANEL
Anion gap: 10 (ref 5–15)
BUN: 9 mg/dL (ref 6–20)
CO2: 27 mmol/L (ref 22–32)
Calcium: 9 mg/dL (ref 8.9–10.3)
Chloride: 103 mmol/L (ref 101–111)
Creatinine, Ser: 1.36 mg/dL — ABNORMAL HIGH (ref 0.61–1.24)
GFR calc Af Amer: >60 mL/min (ref >60)
GFR calc non Af Amer: 52 mL/min — ABNORMAL LOW (ref >60)
Glucose, Bld: 164 mg/dL — ABNORMAL HIGH (ref 65–99)
Potassium: 3.3 mmol/L — ABNORMAL LOW (ref 3.5–5.1)
Sodium: 140 mmol/L (ref 135–145)

## 2017-06-02 LAB — CBC
HCT: 45.7 % (ref 39.0–52.0)
Hemoglobin: 14.3 g/dL (ref 13.0–17.0)
MCH: 26.5 pg (ref 26.0–34.0)
MCHC: 31.3 g/dL (ref 30.0–36.0)
MCV: 84.6 fL (ref 78.0–100.0)
Platelets: 290 10*3/uL (ref 150–400)
RBC: 5.4 MIL/uL (ref 4.22–5.81)
RDW: 15.8 % — ABNORMAL HIGH (ref 11.5–15.5)
WBC: 8.6 10*3/uL (ref 4.0–10.5)

## 2017-06-02 LAB — I-STAT TROPONIN, ED: Troponin i, poc: 0 ng/mL (ref 0.00–0.08)

## 2017-06-02 NOTE — ED Triage Notes (Signed)
C/o constant sharp/pressure across chest with sob since Wednesday.  Denies nausea and vomiting.

## 2017-06-03 ENCOUNTER — Emergency Department (HOSPITAL_COMMUNITY)
Admission: EM | Admit: 2017-06-03 | Discharge: 2017-06-03 | Disposition: A | Discharge status: Home / Self Care | Payer: Medicare Other | Attending: Emergency Medicine | Admitting: Emergency Medicine

## 2017-07-04 ENCOUNTER — Telehealth: Payer: Self-pay | Admitting: Internal Medicine

## 2017-07-04 NOTE — Telephone Encounter (Signed)
Fine with me

## 2017-07-04 NOTE — Telephone Encounter (Signed)
Called and spoke with patient, he has been switched to Dr. Vaughan Browner and has been scheduled for 7.31.19

## 2017-07-04 NOTE — Telephone Encounter (Signed)
OK with me.

## 2017-07-04 NOTE — Telephone Encounter (Signed)
Will route message to both Dr. Vaughan Browner and Dr. Melvyn Novas.  Dr. Vaughan Browner - please advise if you are okay with taking over the pt's care.  Dr. Melvyn Novas - please advise if you are okay with the pt switching to Dr. Vaughan Browner. Thanks.

## 2017-07-04 NOTE — Telephone Encounter (Signed)
Will await on Dr. Matilde Bash response.

## 2017-08-16 ENCOUNTER — Ambulatory Visit: Payer: Medicare Other | Admitting: Pulmonary Disease

## 2017-08-17 ENCOUNTER — Ambulatory Visit: Payer: Medicare Other | Admitting: Pulmonary Disease

## 2017-08-22 ENCOUNTER — Telehealth: Payer: Self-pay

## 2017-08-22 NOTE — Telephone Encounter (Signed)
I called pt to see if he could reschedule his appt with Dr. Vaughan Browner on 8/14. He can see the patient at 1:00 pm. Left message on VM to call back.

## 2017-08-22 NOTE — Telephone Encounter (Signed)
Pt is calling back [phone] or [phone]

## 2017-08-25 NOTE — Telephone Encounter (Signed)
Called and spoke to pt's spouse, [name] [facility]). Pt has been scheduled for 08/30/17 at 1:00p per Dr. Vaughan Browner. Nothing further is needed at this time.

## 2017-08-30 ENCOUNTER — Ambulatory Visit: Payer: Medicare Other | Admitting: Pulmonary Disease

## 2017-08-30 ENCOUNTER — Other Ambulatory Visit (INDEPENDENT_AMBULATORY_CARE_PROVIDER_SITE_OTHER): Payer: Medicare Other

## 2017-08-30 ENCOUNTER — Encounter: Payer: Self-pay | Admitting: Pulmonary Disease

## 2017-08-30 ENCOUNTER — Ambulatory Visit (INDEPENDENT_AMBULATORY_CARE_PROVIDER_SITE_OTHER): Payer: Medicare Other | Admitting: Pulmonary Disease

## 2017-08-30 VITALS — BP 130/76 | HR 86 | Ht 71.0 in | Wt 219.4 lb

## 2017-08-30 DIAGNOSIS — J449 Chronic obstructive pulmonary disease, unspecified: Secondary | ICD-10-CM | POA: Diagnosis not present

## 2017-08-30 DIAGNOSIS — F17210 Nicotine dependence, cigarettes, uncomplicated: Secondary | ICD-10-CM | POA: Diagnosis not present

## 2017-08-30 DIAGNOSIS — R918 Other nonspecific abnormal finding of lung field: Secondary | ICD-10-CM | POA: Diagnosis not present

## 2017-08-30 LAB — CBC WITH DIFFERENTIAL/PLATELET
Basophils Absolute: 0.1 10*3/uL (ref 0.0–0.1)
Basophils Relative: 0.6 % (ref 0.0–3.0)
Eosinophils Absolute: 0.2 10*3/uL (ref 0.0–0.7)
Eosinophils Relative: 1.6 % (ref 0.0–5.0)
HCT: 42.4 % (ref 39.0–52.0)
Hemoglobin: 13.9 g/dL (ref 13.0–17.0)
Lymphocytes Relative: 25.1 % (ref 12.0–46.0)
Lymphs Abs: 2.4 10*3/uL (ref 0.7–4.0)
MCHC: 32.7 g/dL (ref 30.0–36.0)
MCV: 79.6 fl (ref 78.0–100.0)
Monocytes Absolute: 0.9 10*3/uL (ref 0.1–1.0)
Monocytes Relative: 9.3 % (ref 3.0–12.0)
Neutro Abs: 6 10*3/uL (ref 1.4–7.7)
Neutrophils Relative %: 63.4 % (ref 43.0–77.0)
Platelets: 279 10*3/uL (ref 150.0–400.0)
RBC: 5.32 Mil/uL (ref 4.22–5.81)
RDW: 16.6 % — ABNORMAL HIGH (ref 11.5–15.5)
WBC: 9.4 10*3/uL (ref 4.0–10.5)

## 2017-08-30 MED ORDER — TIOTROPIUM BROMIDE MONOHYDRATE 2.5 MCG/ACT IN AERS: 2.0000 | INHALATION_SPRAY | Freq: Every day | RESPIRATORY_TRACT | 3 refills | Status: DC

## 2017-08-30 MED ORDER — TIOTROPIUM BROMIDE MONOHYDRATE 2.5 MCG/ACT IN AERS: 2.0000 | INHALATION_SPRAY | Freq: Every day | RESPIRATORY_TRACT | 0 refills | Status: DC

## 2017-08-30 MED ORDER — VARENICLINE TARTRATE 0.5 MG X 11 & 1 MG X 42 PO MISC: ORAL | 0 refills | Status: DC

## 2017-08-30 NOTE — Progress Notes (Signed)
Jonathon [name]    [id number]    [birth year]  Primary Care Physician:Bouska, Shanon Brow, MD  Referring Physician: [name], MD [address], [place] [address]  Chief complaint: Follow-up for COPD, asthma, lung opacity  HPI: 67 year old active smoker with severe COPD, asthma, spontaneous pneumothorax, coronary artery disease, CVA Previously followed by Dr. Melvyn Novas, maintained on the Symbicort. Complains of stable dyspnea on exertion, cough with mucus production.  No fevers, chills  History noted for right spontaneous pneumothorax in 2010 in [place] where he underwent right upper lobectomy for bullous disease.  Also follows with Dr. [name] for coronary artery disease  Pets: Has outdoor cat, rooster, dog.  No indoor pets. 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  Continues to smoke occasionally Travel history: Originally from [place].  Lived in [place], [place] to [place] 30 years ago Relevant family history: No significant family history of lung issues.  Outpatient Encounter Medications as of 08/30/2017  Medication Sig  . albuterol (PROVENTIL) (2.5 MG/3ML) 0.083% nebulizer solution Take 2.5 mg by nebulization every 4 (four) hours as needed for wheezing or shortness of breath.  . Albuterol Sulfate (PROAIR RESPICLICK) 779 (90 BASE) MCG/ACT AEPB Inhale 2 puffs into the lungs 4 (four) times daily as needed.  Marland Kitchen amLODipine (NORVASC) 10 MG tablet Take 10 mg by mouth every morning.   Marland Kitchen aspirin EC 325 MG tablet Take 650 mg by mouth every morning.   Marland Kitchen atorvastatin (LIPITOR) 80 MG tablet Take 1 tablet (80 mg total) by mouth every morning. Take 1/2 tablet by mouth every morning (Patient taking differently: Take 40 mg by mouth every morning. Take 1/2 tablet by mouth every morning)  . azithromycin  (ZITHROMAX) 250 MG tablet Take 1 tablet (250 mg total) by mouth daily. Stop taking 02/15/2016  . bisoprolol (ZEBETA) 5 MG tablet Take 1 tablet (5 mg total) by mouth daily.  . budesonide-formoterol (SYMBICORT) 160-4.5 MCG/ACT inhaler Inhale 2 puffs into the lungs 2 (two) times daily.  . cloNIDine (CATAPRES) 0.3 MG tablet Take 0.3 mg by mouth 2 (two) times daily.  . Cyanocobalamin (VITAMIN B 12 PO) Take 1 tablet by mouth every morning.   . cyclobenzaprine (FLEXERIL) 5 MG tablet Take 5 mg by mouth at bedtime.   . DULoxetine (CYMBALTA) 30 MG capsule Take 30 mg by mouth every morning.   . gabapentin (NEURONTIN) 400 MG capsule Take 400 mg by mouth 3 (three) times daily.   . irbesartan (AVAPRO) 150 MG tablet TAKE ONE (1) TABLET BY MOUTH EVERY DAY (Patient taking differently: TAKE ONE (1) TABLET BY MOUTH AS NEEDED FOR BP)  . metFORMIN (GLUCOPHAGE) 500 MG tablet Take 500 mg by mouth daily with breakfast.  . nicotine (NICODERM CQ - DOSED IN MG/24 HOURS) 21 mg/24hr patch Place 1 patch (21 mg total) onto the skin daily. (Patient not taking: Reported on 02/10/2016)  . nitroGLYCERIN (NITROSTAT) 0.4 MG SL tablet Place 0.4 mg under the tongue every 5 (five) minutes as needed for chest pain.  . nortriptyline (PAMELOR) 25 MG capsule Take 25 mg by mouth at bedtime.  Marland Kitchen omeprazole (PRILOSEC) 40 MG capsule Take 40 mg by mouth 2 (two) times daily.  Marland Kitchen oseltamivir (TAMIFLU) 75 MG capsule Take 1 capsule (75 mg total) by mouth 2 (two) times daily. Stop taking  02/14/2016  . predniSONE (DELTASONE) 20 MG tablet Take 2 tablets (40 mg total) by mouth daily with breakfast. Stop taking 02/14/2016  . ticagrelor (BRILINTA) 90 MG TABS tablet Take 1 tablet (90 mg total) by mouth 2 (two) times daily.  . [DISCONTINUED] potassium chloride SA (K-DUR,KLOR-CON) 20 MEQ tablet Take 1 tablet (20 mEq total) by mouth 2 (two) times daily.   No facility-administered encounter medications on file as of 08/30/2017.     Allergies as of 08/30/2017  .  (No Known Allergies)    Past Medical History:  Diagnosis Date  . CHF (congestive heart failure) ([place])   . Chronic cough   . COPD, severe ([place])    PT DENIES REGULAR DAILY INHALER ANY PRN  . Coronary artery disease   . CVA (cerebral vascular accident) ([place]) 02/22/2011   LEFT BRAIN STEM PONTINE HEMORRHAGE--  RIGHT SIDED WEAKNESS  . Diabetes mellitus without complication ([place])    type 2  . Dyspnea   . GERD (gastroesophageal reflux disease)   . Harsh voice quality    PT STATES NORMAL FOR HIM  . High cholesterol   . History of CHF (congestive heart failure) MONITORED BY DR BOUSKA   DIASTOLIC  . Hydrocele, left   . Hypertension   . NSTEMI (non-ST elevated myocardial infarction) ([place]) 12/2015  . Pre-operative clearance    GIVEN BY DR Coletta Memos  . Short of breath on exertion   . Smoker   . Weakness of right side of body    S/P CVA   FEB 2013    Past Surgical History:  Procedure Laterality Date  . ABDOMINAL ANGIOGRAM  09/04/2012   Procedure: ABDOMINAL ANGIOGRAM;  Surgeon: Laverda Page, MD;  Location[facility] LAB;  Service: Cardiovascular;;  . CARDIAC CATHETERIZATION  05-02-2011  DR Johnsie Cancel   MILD NONOBSTRUCTIVE CAD/ LAD, OM , AND D1/ EF 60^  . CARDIAC CATHETERIZATION  AUG 2000   NON-OBSTRUTIVE CAD/ EF 68%/ 25% PROXIMAL LAD/ 20% MID CIRCUMFLEX  . CARDIAC CATHETERIZATION [name] 12/23/2015   Procedure: Left Heart Cath and Coronary Angiography;  Surgeon: Adrian Prows, MD;  Location: [facility] CV LAB;  Service: Cardiovascular;  Laterality: [name];  . CARDIAC CATHETERIZATION [name] 12/24/2015   Procedure: Coronary Stent Intervention;  Surgeon: Adrian Prows, MD;  Location: [facility] CV LAB;  Service: Cardiovascular;  Laterality: [name];  . CORONARY ANGIOPLASTY    . CORONARY STENT PLACEMENT  12/24/2015    PTCA and stenting of the distal RCA   . HYDROCELE EXCISION Left 04/24/2012   Procedure: HYDROCELECTOMY ADULT;  Surgeon: Fredricka Bonine, MD;  Location: [facility];  Service:  Urology;  Laterality: Left;  90 mins req for this case     . [place]  . LEFT AND RIGHT HEART CATHETERIZATION WITH CORONARY ANGIOGRAM [name] 09/04/2012   Procedure: LEFT AND RIGHT HEART CATHETERIZATION WITH CORONARY ANGIOGRAM;  Surgeon: Laverda Page, MD;  Location: [facility] CATH LAB;  Service: Cardiovascular;  Laterality: [name];  . LEFT HEART CATHETERIZATION WITH CORONARY ANGIOGRAM [name] 05/02/2011   Procedure: LEFT HEART CATHETERIZATION WITH CORONARY ANGIOGRAM;  Surgeon: Josue Hector, MD;  Location: [facility] CATH LAB;  Service: Cardiovascular;  Laterality: [name];  . THORACOTOMY/LOBECTOMY Right 03-06-2008   AND STAPLING OF BLEBS FOR SPONTANOUS PNEUMOTHORAX  . TRANSTHORACIC ECHOCARDIOGRAM  02-17-2011   MODERATE LVH/ LVSF NORMAL/ EF 95-63%/ GRADE I DIASTOLIC DYSFUNCTION    Family History  Problem Relation Age of Onset  . Hypertension Mother   . Stroke Mother   .  Lung disease Father        died of "colapsed lung" per patient  . Asthma Father   . Colon cancer Neg Hx   . Stomach cancer Neg Hx     Social History   Socioeconomic History  . Marital status: Married    Spouse name: Not on file  . Number of children: Not on file  . Years of education: Not on file  . Highest education level: Not on file  Occupational History  . Not on file  Social Needs  . Financial resource strain: Not on file  . Food insecurity:    Worry: Not on file    Inability: Not on file  . Transportation needs:    Medical: Not on file    Non-medical: Not on file  Tobacco Use  . Smoking status: Current Every Day Smoker    Packs/day: 1.00    Years: 53.00    Pack years: 53.00    Types: Cigarettes  . Smokeless tobacco: Never Used  . Tobacco comment: 1/2 ppd 01/27/14  Substance and Sexual Activity  . Alcohol use: Yes    Alcohol/week: 0.0 standard drinks    Comment: 02/10/2016 "nothing in the last 10 years"  . Drug use: No  . Sexual activity: Not on file  Lifestyle  . Physical activity:    Days per  week: Not on file    Minutes per session: Not on file  . Stress: Not on file  Relationships  . Social connections:    Talks on phone: Not on file    Gets together: Not on file    Attends religious service: Not on file    Active member of club or organization: Not on file    Attends meetings of clubs or organizations: Not on file    Relationship status: Not on file  . Intimate partner violence:    Fear of current or ex partner: Not on file    Emotionally abused: Not on file    Physically abused: Not on file    Forced sexual activity: Not on file  Other Topics Concern  . Not on file  Social History Narrative  . Not on file    Review of systems: Review of Systems  Constitutional: Negative for fever and chills.  HENT: Negative.   Eyes: Negative for blurred vision.  Respiratory: as per HPI  Cardiovascular: Negative for chest pain and palpitations.  Gastrointestinal: Negative for vomiting, diarrhea, blood per rectum. Genitourinary: Negative for dysuria, urgency, frequency and hematuria.  Musculoskeletal: Negative for myalgias, back pain and joint pain.  Skin: Negative for itching and rash.  Neurological: Negative for dizziness, tremors, focal weakness, seizures and loss of consciousness.  Endo/Heme/Allergies: Negative for environmental allergies.  Psychiatric/Behavioral: Negative for depression, suicidal ideas and hallucinations.  All other systems reviewed and are negative.  Physical Exam: There were no vitals taken for this visit. Gen:      No acute distress HEENT:  EOMI, sclera anicteric Neck:     No masses; no thyromegaly Lungs:    Clear to auscultation bilaterally; normal respiratory effort CV:         Regular rate and rhythm; no murmurs Abd:      + bowel sounds; soft, non-tender; no palpable masses, no distension Ext:    No edema; adequate peripheral perfusion Skin:      Warm and dry; no rash Neuro: alert and oriented x 3 Psych: normal mood and affect  Data  Reviewed: PFTs 11/05/2013 FVC 2.88 [  64%], FEV1 1.50 [44%), F/F 52 Moderate-severe obstruction with marked bronchodilator response Moderate diffusion defect.  CT 02/10/2016-no pulmonary embolism, extensive bilateral emphysematous changes, bullous disease.  Possible growth of the noted glandular lesion..  I have reviewed the images personally.  Assessment:  Severe emphysematous COPD, asthma Currently on Symbicort.  Will add Spiriva Respimat Reevaluate with CBC differential, IgE, alpha-1 antitrypsin levels and phenotype  Abnormal CT, lingular mass Seen since 2011.  Last CT scan shows possible growth We will get a repeat CT for evaluation  Active smoker Smoking cessation encouraged.  He is interested in quitting.  Will go prescription for Chantix and nicotine patches Reassess in 3 months.  Plan/Recommendations: - Continue Symbicort, add Spiriva - CBC, alpha-1 antitrypsin, IgE - Smoking cessation with Chantix, nicotine patch - Follow-up CT of the chest.  Marshell Garfinkel MD [facility] 08/30/2017, 1:10 PM  CC: [name], MD

## 2017-08-30 NOTE — Patient Instructions (Signed)
Recheck CBC differential, alpha-1 antitrypsin levels and phenotype, IgE today We will schedule you for CT of the chest without contrast to follow-up lung nodule Continue the Symbicort.  We will start you on Spiriva Respimat  Please continue to work on smoking cessation.  We will give you a prescription for Chantix Use this along with nicotine patches  Follow-up in 3 months.

## 2017-09-04 LAB — ALPHA-1 ANTITRYPSIN PHENOTYPE: A-1 Antitrypsin, Ser: 170 mg/dL (ref 83–199)

## 2017-09-04 LAB — IGE: IgE (Immunoglobulin E), Serum: 1084 kU/L — ABNORMAL HIGH (ref <=114)

## 2017-09-06 ENCOUNTER — Other Ambulatory Visit: Payer: Medicare Other

## 2017-09-07 ENCOUNTER — Ambulatory Visit (INDEPENDENT_AMBULATORY_CARE_PROVIDER_SITE_OTHER)
Admission: RE | Admit: 2017-09-07 | Discharge: 2017-09-07 | Disposition: A | Discharge status: Home / Self Care | Payer: Medicare Other | Source: Ambulatory Visit | Attending: Pulmonary Disease | Admitting: Pulmonary Disease

## 2017-09-07 DIAGNOSIS — R918 Other nonspecific abnormal finding of lung field: Secondary | ICD-10-CM | POA: Diagnosis not present

## 2017-09-12 ENCOUNTER — Telehealth: Payer: Self-pay | Admitting: Pulmonary Disease

## 2017-09-12 DIAGNOSIS — R918 Other nonspecific abnormal finding of lung field: Secondary | ICD-10-CM

## 2017-09-12 NOTE — Telephone Encounter (Signed)
Called patient unable to reach left message to give us a call back.

## 2017-09-13 NOTE — Telephone Encounter (Signed)
Order has been placed for PET scan per Dr. Vaughan Browner. Nothing further is needed at this time.

## 2017-09-13 NOTE — Telephone Encounter (Signed)
Notes recorded by Marshell Garfinkel, MD on 09/07/2017 at 2:56 PM EDT Labs show elevation in IgE which is consistent with asthma, allergies. Rest of the labs are normal. ------------------------------------------- Spoke with pt's wife, Jonathon [name]. She is aware of his results. Per the pt's chart, Dr. Vaughan Browner wanted to speak to him about his CT results. Jonathon [name] states that Dr. Vaughan Browner call her at [phone].  Dr. Vaughan Browner - please advise. Thanks.

## 2017-09-13 NOTE — Telephone Encounter (Signed)
Discussed with pt's wife. Please order PET scan for evaluation of lung nodule.

## 2017-09-22 ENCOUNTER — Encounter (HOSPITAL_COMMUNITY): Payer: Medicare Other

## 2017-09-28 ENCOUNTER — Ambulatory Visit (HOSPITAL_COMMUNITY)
Admission: RE | Admit: 2017-09-28 | Discharge: 2017-09-28 | Disposition: A | Discharge status: Home / Self Care | Payer: Medicare Other | Source: Ambulatory Visit | Attending: Pulmonary Disease | Admitting: Pulmonary Disease

## 2017-09-28 DIAGNOSIS — K8020 Calculus of gallbladder without cholecystitis without obstruction: Secondary | ICD-10-CM | POA: Insufficient documentation

## 2017-09-28 DIAGNOSIS — I700 Atherosclerosis of aorta: Secondary | ICD-10-CM | POA: Insufficient documentation

## 2017-09-28 DIAGNOSIS — N433 Hydrocele, unspecified: Secondary | ICD-10-CM | POA: Insufficient documentation

## 2017-09-28 DIAGNOSIS — J439 Emphysema, unspecified: Secondary | ICD-10-CM | POA: Diagnosis not present

## 2017-09-28 DIAGNOSIS — I2510 Atherosclerotic heart disease of native coronary artery without angina pectoris: Secondary | ICD-10-CM | POA: Insufficient documentation

## 2017-09-28 DIAGNOSIS — R918 Other nonspecific abnormal finding of lung field: Secondary | ICD-10-CM | POA: Diagnosis present

## 2017-09-28 LAB — GLUCOSE, CAPILLARY: Glucose-Capillary: 123 mg/dL — ABNORMAL HIGH (ref 70–99)

## 2017-09-28 MED ORDER — FLUDEOXYGLUCOSE F - 18 (FDG) INJECTION
10.8500 | Freq: Once | INTRAVENOUS | Status: AC | PRN
  Administered 2017-09-28: 10.85 via INTRAVENOUS

## 2017-10-02 ENCOUNTER — Telehealth: Payer: Self-pay | Admitting: Pulmonary Disease

## 2017-10-02 DIAGNOSIS — R490 Dysphonia: Secondary | ICD-10-CM

## 2017-10-02 NOTE — Telephone Encounter (Signed)
Call spoke with the patient  He would like his pet results and advised that Dr.Mannam hasn't reviewed the PET scan yet I will send him this message to make him aware.

## 2017-10-03 NOTE — Telephone Encounter (Signed)
ENT ref has been placed. Nothing further is needed.

## 2017-10-03 NOTE — Telephone Encounter (Signed)
I called and discussed results of PET with patient. There is moderate uptake in the areas in lung that we will monitor.  Please make a referral to ENT to evaluate uptake in the tongue and throat area.

## 2017-12-04 ENCOUNTER — Ambulatory Visit: Payer: Medicare Other | Admitting: Pulmonary Disease

## 2018-02-07 ENCOUNTER — Other Ambulatory Visit: Payer: Self-pay | Admitting: Pulmonary Disease

## 2018-03-15 ENCOUNTER — Other Ambulatory Visit: Payer: Self-pay | Admitting: Pulmonary Disease

## 2018-03-22 ENCOUNTER — Other Ambulatory Visit: Payer: Self-pay

## 2018-03-22 DIAGNOSIS — I10 Essential (primary) hypertension: Secondary | ICD-10-CM

## 2018-03-22 MED ORDER — LOSARTAN POTASSIUM 50 MG PO TABS: 50.0000 mg | ORAL_TABLET | Freq: Every day | ORAL | 3 refills | Status: DC

## 2018-03-22 MED ORDER — HYDROCHLOROTHIAZIDE 12.5 MG PO CAPS: 12.5000 mg | ORAL_CAPSULE | Freq: Every day | ORAL | 3 refills | Status: DC

## 2018-04-02 ENCOUNTER — Other Ambulatory Visit: Payer: Self-pay | Admitting: Cardiology

## 2018-04-12 MED FILL — LOSARTAN POTASSIUM 50 MG TA: 50 | 30 days supply | Qty: 30 | Fill #0

## 2018-04-12 MED FILL — GABAPENTIN 400 MG CAPS: 400 | 30 days supply | Qty: 180 | Fill #0

## 2018-04-12 MED FILL — HYDROCHLOROTHIAZIDE 12.5 MG: 12.5 | 30 days supply | Qty: 30 | Fill #0

## 2018-04-12 MED FILL — SYMBICORT 160-4.5 MCG INH: 160-4.5 | 30 days supply | Qty: 10 | Fill #0

## 2018-04-30 MED FILL — CYCLOBENZAPRINE HCL 5 MG TA: 5 | 30 days supply | Qty: 30 | Fill #0

## 2018-04-30 MED FILL — BISOPROLOL FUMARATE 5 MG TA: 5 | 90 days supply | Qty: 90 | Fill #0

## 2018-05-04 MED FILL — TAMSULOSIN HCL 0.4 MG CAP: 0.4 | 30 days supply | Qty: 30 | Fill #0

## 2018-05-07 MED FILL — HYDROCHLOROTHIAZIDE 12.5 MG: 12.5 | 90 days supply | Qty: 90 | Fill #0

## 2018-05-07 MED FILL — DULoxetine HCL 30 MG CPEP: 30 | 30 days supply | Qty: 30 | Fill #0

## 2018-05-18 ENCOUNTER — Telehealth: Payer: Self-pay | Admitting: Cardiology

## 2018-05-18 MED FILL — LOSARTAN POTASSIUM 50 MG TA: 50 | 90 days supply | Qty: 90 | Fill #0

## 2018-05-18 MED FILL — GABAPENTIN 400 MG CAPS: 400 | 30 days supply | Qty: 180 | Fill #1

## 2018-05-18 NOTE — Telephone Encounter (Signed)
Patient's wife called at 7:30 PM on 05/17/2018 stating that patient was having chest pain, shortness of breath, and shaking. I asked them to call 911. It does not appear that he made it to any of the [facility] hospitals. Please follow up and check on the patient.  Thanks MJP

## 2018-05-18 NOTE — Telephone Encounter (Signed)
Can you please f/u. I can see him or do VV

## 2018-05-21 MED FILL — metFORMIN HCL 500 MG TABS: 500 | 90 days supply | Qty: 90 | Fill #0

## 2018-05-21 MED FILL — AMLODIPINE BESYLATE 10 MG T: 10 | 90 days supply | Qty: 90 | Fill #0

## 2018-05-21 NOTE — Telephone Encounter (Signed)
Still no answer

## 2018-05-28 MED FILL — CYCLOBENZAPRINE HCL 5 MG TA: 5 | 30 days supply | Qty: 30 | Fill #1

## 2018-06-12 MED FILL — TAMSULOSIN HCL 0.4 MG CAP: 0.4 | 30 days supply | Qty: 30 | Fill #1

## 2018-06-12 MED FILL — DULoxetine HCL 30 MG CPEP: 30 | 30 days supply | Qty: 30 | Fill #1

## 2018-06-12 MED FILL — NORTRIPTYLINE HCL CAP 25 MG: 25 | 90 days supply | Qty: 90 | Fill #0

## 2018-06-22 MED FILL — GABAPENTIN 400 MG CAPS: 400 | 30 days supply | Qty: 180 | Fill #2

## 2018-06-25 MED FILL — SYMBICORT 160-4.5 MCG INH: 160-4.5 | 30 days supply | Qty: 10 | Fill #0

## 2018-06-25 MED FILL — CYCLOBENZAPRINE HCL 5 MG TA: 5 | 30 days supply | Qty: 30 | Fill #0

## 2018-07-03 MED FILL — PANTOPRAZOLE SOD DR 20 MG T: 20 | 90 days supply | Qty: 90 | Fill #0

## 2018-07-04 MED FILL — cloNIDine HCL 0.3 MG TABS: 0.3 | 90 days supply | Qty: 180 | Fill #0

## 2018-07-06 MED FILL — DULoxetine HCL 30 MG CPEP: 30 | 30 days supply | Qty: 30 | Fill #2

## 2018-07-09 MED FILL — NORTRIPTYLINE HCL CAP 25 MG: 25 | 90 days supply | Qty: 90 | Fill #0

## 2018-07-09 MED FILL — TAMSULOSIN HCL 0.4 MG CAP: 0.4 | 30 days supply | Qty: 30 | Fill #0

## 2018-08-23 ENCOUNTER — Other Ambulatory Visit: Payer: Self-pay

## 2018-08-23 DIAGNOSIS — Z20822 Contact with and (suspected) exposure to covid-19: Secondary | ICD-10-CM

## 2018-08-24 LAB — NOVEL CORONAVIRUS, NAA: SARS-CoV-2, NAA: NOT DETECTED

## 2018-08-24 LAB — SPECIMEN STATUS REPORT

## 2018-08-28 ENCOUNTER — Telehealth: Payer: Self-pay | Admitting: Family Medicine

## 2018-08-28 NOTE — Telephone Encounter (Signed)
Patient called to recieve his covid test results

## 2018-09-06 ENCOUNTER — Other Ambulatory Visit: Payer: Self-pay

## 2018-09-06 DIAGNOSIS — Z20822 Contact with and (suspected) exposure to covid-19: Secondary | ICD-10-CM

## 2018-09-07 LAB — NOVEL CORONAVIRUS, NAA: SARS-CoV-2, NAA: NOT DETECTED

## 2018-10-08 ENCOUNTER — Other Ambulatory Visit: Payer: Self-pay

## 2018-10-08 ENCOUNTER — Ambulatory Visit (INDEPENDENT_AMBULATORY_CARE_PROVIDER_SITE_OTHER): Payer: Medicare Other | Admitting: Cardiology

## 2018-10-08 ENCOUNTER — Encounter: Payer: Self-pay | Admitting: Cardiology

## 2018-10-08 VITALS — BP 136/80 | HR 73 | Ht 71.0 in | Wt 211.0 lb

## 2018-10-08 DIAGNOSIS — Z8673 Personal history of transient ischemic attack (TIA), and cerebral infarction without residual deficits: Secondary | ICD-10-CM | POA: Diagnosis not present

## 2018-10-08 DIAGNOSIS — I2510 Atherosclerotic heart disease of native coronary artery without angina pectoris: Secondary | ICD-10-CM

## 2018-10-08 DIAGNOSIS — I10 Essential (primary) hypertension: Secondary | ICD-10-CM

## 2018-10-08 DIAGNOSIS — J432 Centrilobular emphysema: Secondary | ICD-10-CM

## 2018-10-08 NOTE — Progress Notes (Signed)
Primary Physician/Referring:  [name], MD  Patient ID: Jonathon [name], male    DOB: 1950/09/01, 68 y.o.   MRN: [id number]  Chief Complaint  Patient presents with   Hypertension   Coronary Artery Disease   Follow-up    1 year   HPI:    Jonathon [name]  is a 68 y.o.  [name] male patient with history of hypertension, hyperlipidemia, remote stroke without restrictive defect, recent tobacco cessation in September 2018, CAD with PCI in December 2017 to RCA. He also has history of emphysema, pulmonary nodules and right pneumothorax in 2010 due to emphysematous bleb while he was in [place] and has history of right upper lobe lobectomy.  Patient is here on annual visit, fortunately he remains asymptomatic except for chronic dyspnea.  No recurrence of chest pain.  Unfortunately he did not bring any of his medication list.. He reports dyspnea has been stable. No chest pain. He has remained abstinant from smoking.  Past Medical History:  Diagnosis Date   CHF (congestive heart failure) (HCC)    Chronic cough    COPD, severe (HCC)    PT DENIES REGULAR DAILY INHALER ANY PRN   Coronary artery disease    CVA (cerebral vascular accident) ([place]) 02/22/2011   LEFT BRAIN STEM PONTINE HEMORRHAGE--  RIGHT SIDED WEAKNESS   Diabetes mellitus without complication (HCC)    type 2   Dyspnea    GERD (gastroesophageal reflux disease)    Harsh voice quality    PT STATES NORMAL FOR HIM   High cholesterol    History of CHF (congestive heart failure) MONITORED BY DR Coletta Memos   DIASTOLIC   Hydrocele, left    Hypertension    NSTEMI (non-ST elevated myocardial infarction) ([place]) 12/2015   Pre-operative clearance    GIVEN BY DR Coletta Memos   Short of breath on exertion    Smoker    Weakness of right side of body    S/P CVA   FEB 2013   Past Surgical History:  Procedure Laterality Date   ABDOMINAL ANGIOGRAM  09/04/2012   Procedure: ABDOMINAL ANGIOGRAM;  Surgeon:  Laverda Page, MD;  Location: [facility] CATH LAB;  Service: Cardiovascular;;   CARDIAC CATHETERIZATION  05-02-2011  DR Johnsie Cancel   MILD NONOBSTRUCTIVE CAD/ LAD, OM , AND D1/ EF 60^   CARDIAC CATHETERIZATION  AUG 2000   NON-OBSTRUTIVE CAD/ EF 68%/ 25% PROXIMAL LAD/ 20% MID CIRCUMFLEX   CARDIAC CATHETERIZATION [name] 12/23/2015   Procedure: Left Heart Cath and Coronary Angiography;  Surgeon: Adrian Prows, MD;  Location: [place] CV LAB;  Service: Cardiovascular;  Laterality: [name];   CARDIAC CATHETERIZATION [name] 12/24/2015   Procedure: Coronary Stent Intervention;  Surgeon: Adrian Prows, MD;  Location: [place] CV LAB;  Service: Cardiovascular;  Laterality: [name];   CORONARY ANGIOPLASTY     CORONARY STENT PLACEMENT  12/24/2015    PTCA and stenting of the distal RCA    HYDROCELE EXCISION Left 04/24/2012   Procedure: HYDROCELECTOMY ADULT;  Surgeon: Fredricka Bonine, MD;  Location: [facility];  Service: Urology;  Laterality: Left;  90 mins req for this case      [place]   LEFT AND RIGHT HEART CATHETERIZATION WITH CORONARY ANGIOGRAM [name] 09/04/2012   Procedure: LEFT AND RIGHT HEART CATHETERIZATION WITH CORONARY ANGIOGRAM;  Surgeon: Laverda Page, MD;  Location: [facility] CATH LAB;  Service: Cardiovascular;  Laterality: [name];   LEFT HEART CATHETERIZATION WITH CORONARY ANGIOGRAM [name] 05/02/2011  Procedure: LEFT HEART CATHETERIZATION WITH CORONARY ANGIOGRAM;  Surgeon: Josue Hector, MD;  Location: [facility] CATH LAB;  Service: Cardiovascular;  Laterality: [name];   THORACOTOMY/LOBECTOMY Right 03-06-2008   AND STAPLING OF BLEBS FOR SPONTANOUS PNEUMOTHORAX   TRANSTHORACIC ECHOCARDIOGRAM  02-17-2011   MODERATE LVH/ LVSF NORMAL/ EF 80-16%/ GRADE I DIASTOLIC DYSFUNCTION   Social History   Socioeconomic History   Marital status: Married    Spouse name: Not on file   Number of children: 4   Years of education: Not on file   Highest education level: Not on file    Occupational History   Not on file  Social Needs   Financial resource strain: Not on file   Food insecurity    Worry: Not on file    Inability: Not on file   Transportation needs    Medical: Not on file    Non-medical: Not on file  Tobacco Use   Smoking status: Former Smoker    Packs/day: 0.00    Years: 53.00    Pack years: 0.00    Types: Cigarettes    Quit date: 2019    Years since quitting: 1.7   Smokeless tobacco: Never Used   Tobacco comment: 3-4 cigarettes daily- 08/30/17  Substance and Sexual Activity   Alcohol use: Yes    Alcohol/week: 0.0 standard drinks    Comment: 02/10/2016 "nothing in the last 10 years"   Drug use: No   Sexual activity: Not on file  Lifestyle   Physical activity    Days per week: Not on file    Minutes per session: Not on file   Stress: Not on file  Relationships   Social connections    Talks on phone: Not on file    Gets together: Not on file    Attends religious service: Not on file    Active member of club or organization: Not on file    Attends meetings of clubs or organizations: Not on file    Relationship status: Not on file   Intimate partner violence    Fear of current or ex partner: Not on file    Emotionally abused: Not on file    Physically abused: Not on file    Forced sexual activity: Not on file  Other Topics Concern   Not on file  Social History Narrative   Not on file   ROS  Review of Systems  Constitution: Negative for chills, decreased appetite, malaise/fatigue and weight gain.  Cardiovascular: Positive for dyspnea on exertion. Negative for leg swelling and syncope.  Respiratory: Positive for cough.   Endocrine: Negative for cold intolerance.  Hematologic/Lymphatic: Does not bruise/bleed easily.  Musculoskeletal: Negative for joint swelling.  Gastrointestinal: Negative for abdominal pain, anorexia, change in bowel habit, hematochezia and melena.  Neurological: Negative for headaches and  light-headedness.  Psychiatric/Behavioral: Negative for depression and substance abuse.  All other systems reviewed and are negative.  Objective   Vitals with BMI 10/08/2018 08/30/2017 06/03/2017  Height _0  _1  -  Weight 211 lbs 219 lbs 6 oz -  BMI 55.37 48.27 -  Systolic 078 675 449  Diastolic 80 76 95  Pulse 73 86 80    Blood pressure 136/80, pulse 73, height _2  (1.803 m), weight 211 lb (95.7 kg), SpO2 95 %. Body mass index is 29.43 kg/m.   Physical Exam  Constitutional:  Well-built, mildly obese in no acute distress.  HENT:  Head: Atraumatic.  Eyes: Conjunctivae are normal.  Neck: Neck  supple. No JVD present. No thyromegaly present.  Cardiovascular: Normal rate, regular rhythm, normal heart sounds and intact distal pulses. Exam reveals no gallop.  No murmur heard. Pulmonary/Chest: Effort normal.  Prolonged expiration heard bilaterally.  Abdominal: Soft. Bowel sounds are normal.  Musculoskeletal: Normal range of motion.  Neurological: He is alert.  Skin: Skin is warm and dry.  Psychiatric: He has a normal mood and affect.   Radiology: No results found.  Laboratory examination:    POCT A1C (06/27/2018 4:44 PM EDT) POCT A1C (06/27/2018 4:44 PM EDT)  Component Value Ref Range Performed At Pathologist Signature  Hemoglobin A1c 6.9 (A) 4.8 - 5.6 % [facility]    POCT CBC W/DIFF (03/27/2018 4:46 PM EDT) POCT CBC W/DIFF (03/27/2018 4:46 PM EDT)  Component Value Ref Range Performed At Pathologist Signature  WBC 9.1 4.5 - 11.0 x 10^3/uL [facility]    % LYMPH 27.9 25.0 - 40.0 % [facility]    MONO % 6.9 4.0 - 12.0 % [facility]    % GRAN 65.2 50.0 - 73.0 % [facility]    Lymphs Absolute 2.5 1.0 - 4.5 x 10^3/uL [facility]    Monos Absolute 0.6 0.1 - 1.0 x 10^3/uL [facility]    Grans Absolute 5.9 1.5 - 7.5 x 10^3/uL  [facility]    RBC 5.90 (A) 4.50 - 5.30 x10E6/uL [facility]    HGB 15.0 13.5 - 17.5 g/dL [facility]    Hematocrit 48.4 40.5 - 52.5 % [facility]    MCV 82.1 (A) 83.0 - 97.0 fL [facility]    MCH 25.3 (A) 26.0 - 34.0 pg [facility]    MCHC 30.9 (A) 31.0 - 37.0 g/dL [facility]    RDW 17.2 (A) 11.9 - 16 % [facility]    Platelet Count 270 150 - 400 x 10^3/uL [facility]     Lipid Panel With LDL/HDL RatioResulted: 03/28/2018 4:35 AM [facility] Component Name Value Ref Range  Cholesterol, Total 137 100 - 199 mg/dL  Triglycerides 102 0 - 149 mg/dL  HDL 54 >39 mg/dL  VLDL Cholesterol Cal 20 5 - 40 mg/dL  LDL 63 0 - 99 mg/dL  LDL/HDL Ratio 1.2    Comprehensive Metabolic PanelResulted: 8/36/6294 9:36 AM [facility] Component Name Value Ref Range  Glucose 110 (H) 65 - 99 mg/dL  BUN 9 8 - 27 mg/dL  Creatinine, Serum 1.04 0.76 - 1.27 mg/dL  eGFR If NonAfrican American 74 >59 mL/min/1.73  eGFR If African American 85 >59 mL/min/1.73  BUN/Creatinine Ratio 9 (L) 10 - 24   Sodium 136 134 - 144 mmol/L  Potassium 4.2     Medications  No Known Allergies   Prior to Admission medications   Medication Sig Start Date End Date Taking? Authorizing Provider  albuterol (PROVENTIL) (2.5 MG/3ML) 0.083% nebulizer solution Take 2.5 mg by nebulization every 4 (four) hours as needed for wheezing or shortness of breath.    [provider]  Albuterol Sulfate (PROAIR RESPICLICK) 765 (90 BASE) MCG/ACT AEPB Inhale 2 puffs into the lungs 4 (four) times daily as needed. 02/24/14   Parrett, Fonnie Mu, NP  amLODipine (NORVASC) 10 MG tablet Take 10 mg by mouth every morning.  [provider]  aspirin EC 325 MG tablet Take 650 mg by mouth every morning.     [provider]  atorvastatin (LIPITOR) 80 MG tablet Take 1  tablet (80 mg total) by mouth every morning. Take 1/2 tablet by mouth every morning Patient taking differently: Take 40 mg by mouth every morning. Take 1/2 tablet by mouth every morning 12/25/15   Theodis Blaze, MD  azithromycin (ZITHROMAX) 250 MG tablet Take 1 tablet (250 mg total) by mouth daily. Stop taking 02/15/2016 02/12/16   Ledell Noss, MD  bisoprolol (ZEBETA) 5 MG tablet Take 1 tablet (5 mg total) by mouth daily. 12/26/15   Theodis Blaze, MD  budesonide-formoterol [facility]) 160-4.5 MCG/ACT inhaler Inhale 2 puffs into the lungs 2 (two) times daily.    [provider]  cloNIDine (CATAPRES) 0.3 MG tablet Take 0.3 mg by mouth 2 (two) times daily.    [provider]  Cyanocobalamin (VITAMIN B 12 PO) Take 1 tablet by mouth every morning.     [provider]  cyclobenzaprine (FLEXERIL) 5 MG tablet Take 5 mg by mouth at bedtime.  02/03/14   [provider]  DULoxetine (CYMBALTA) 30 MG capsule Take 30 mg by mouth every morning.  02/03/14   [provider]  gabapentin (NEURONTIN) 400 MG capsule Take 400 mg by mouth 3 (three) times daily.     [provider]  hydrochlorothiazide (MICROZIDE) 12.5 MG capsule Take 1 capsule (12.5 mg total) by mouth daily. 03/22/18 06/20/18  Adrian Prows, MD  irbesartan (AVAPRO) 150 MG tablet TAKE ONE (1) TABLET BY MOUTH EVERY DAY Patient taking differently: TAKE ONE (1) TABLET BY MOUTH AS NEEDED FOR BP 04/09/15   Tanda Rockers, MD  losartan (COZAAR) 50 MG tablet Take 1 tablet (50 mg total) by mouth daily. 03/22/18 06/20/18  Adrian Prows, MD  metFORMIN (GLUCOPHAGE) 500 MG tablet Take 500 mg by mouth daily with breakfast.    [provider]  nitroGLYCERIN (NITROSTAT) 0.4 MG SL tablet Place 0.4 mg under the tongue every 5 (five) minutes as needed for chest pain.    [provider]  nortriptyline (PAMELOR) 25 MG capsule Take 25 mg by mouth at bedtime.    [provider]  oseltamivir (TAMIFLU) 75 MG capsule  Take 1 capsule (75 mg total) by mouth 2 (two) times daily. Stop taking 02/14/2016 02/12/16   Ledell Noss, MD  pantoprazole (PROTONIX) 20 MG tablet TAKE 1 TABLET BY MOUTH DAILY 04/02/18   Adrian Prows, MD  ticagrelor (BRILINTA) 90 MG TABS tablet Take 1 tablet (90 mg total) by mouth 2 (two) times daily. 12/25/15   Theodis Blaze, MD  Tiotropium Bromide Monohydrate (SPIRIVA RESPIMAT) 2.5 MCG/ACT AERS Inhale 2 puffs into the lungs daily. 08/30/17   Mannam, Hart Robinsons, MD  varenicline (CHANTIX PAK) 0.5 MG X 11 & 1 MG X 42 tablet Take one 0.5 mg tablet by mouth once daily for 3 days, then increase to one 0.5 mg tablet twice daily for 4 days, then increase to one 1 mg tablet twice daily. 08/30/17   Mannam, Hart Robinsons, MD  potassium chloride SA (K-DUR,KLOR-CON) 20 MEQ tablet Take 1 tablet (20 mEq total) by mouth 2 (two) times daily. 03/03/11 04/12/11  Bary Leriche, PA-C     Current Outpatient Medications  Medication Instructions   albuterol (PROVENTIL) 2.5 mg, Nebulization, Every 4 hours PRN   Albuterol Sulfate (PROAIR RESPICLICK) 323 (90 BASE) MCG/ACT AEPB 2 puffs, Inhalation, 4 times daily PRN  amLODipine (NORVASC) 10 mg, Oral, BH-each morning   aspirin EC 650 mg, Oral, BH-each morning   atorvastatin (LIPITOR) 80 mg, Oral, BH-each morning, Take 1/2 tablet by mouth every morning   bisoprolol (ZEBETA) 5 mg, Oral, Daily   budesonide-formoterol (SYMBICORT) 160-4.5 MCG/ACT inhaler 2 puffs, Inhalation, 2 times daily   cloNIDine (CATAPRES) 0.3 mg, 2 times daily   Cyanocobalamin (VITAMIN B 12 PO) 1 tablet, Oral, BH-each morning   cyclobenzaprine (FLEXERIL) 5 mg, Oral, Daily at bedtime   DULoxetine (CYMBALTA) 30 mg, Oral, BH-each morning   gabapentin (NEURONTIN) 400 mg, Oral, 3 times daily   hydrochlorothiazide (MICROZIDE) 12.5 mg, Oral, Daily   irbesartan (AVAPRO) 150 MG tablet TAKE ONE (1) TABLET BY MOUTH EVERY DAY   losartan (COZAAR) 50 mg, Oral, Daily   metFORMIN (GLUCOPHAGE) 500 mg, Daily with  breakfast   nitroGLYCERIN (NITROSTAT) 0.4 mg, Sublingual, Every 5 min PRN   nortriptyline (PAMELOR) 25 mg, Daily at bedtime   pantoprazole (PROTONIX) 20 MG tablet TAKE 1 TABLET BY MOUTH DAILY   ticagrelor (BRILINTA) 90 mg, Oral, 2 times daily   Tiotropium Bromide Monohydrate (SPIRIVA RESPIMAT) 2.5 MCG/ACT AERS 2 puffs, Inhalation, Daily    Cardiac Studies:   Coronary angiogram 12/24/2015: PTCA and stenting of the distal RCA with 4.0 x 12 mm onyx DES. Normal LVEF. Mild disease in other vessels.  Lexiscan myoview stress test 06/19/2017:  1. Lexiscan stress test was performed. Exercise capacity was not assessed. Stress symptoms included shortness of breath, lightheadedness, and chest pressure. Blood pressure was 126/78 mmHg. Stress EKG is non diagnostic for ischemia as it is a pharmacologic stress. In addition, it showed normal sinus rhythm, normal stress conduction, no stress arrhythmias and normal stress repolarization.   2. The overall quality of the study is good. There is no evidence of abnormal lung activity. Stress and rest SPECT images demonstrate homogeneous tracer distribution throughout the myocardium. Gated SPECT imaging reveals normal myocardial thickening and wall motion. The left ventricular ejection fraction was calculated as 43%, however visually appears normal. 3. Low to intermediate risk study.  Echocardiogram 08/08/2017:  Left ventricle cavity is normal in size. Mild concentric hypertrophy of the left ventricle. Normal global wall motion. Normal diastolic filling pattern. Calculated EF 52%. Mild to moderate aortic regurgitation. Mild (Grade I) mitral regurgitation. Inadequate tricuspid regurgitation jet to estimate pulmonary artery pressure. Normal right atrial pressure. No significant change compared to previous study on 01/06/2016.  Assessment     ICD-10-CM   1. Coronary artery disease involving native coronary artery of native heart without angina pectoris  I25.10  EKG 12-Lead  2. Essential hypertension, benign  I10   3. H/O stroke without residual deficits  Z86.73   4. Centrilobular emphysema ([place])  J43.2     EKG 10/08/2018: Normal sinus rhythm at rate of 67 bpm, poor R-wave progression, cannot exclude anteroseptal infarct old.  Nonspecific T abnormality.  EKG 06/14/2017: Normal sinus rhythm at 83 bpm, normal axis, no evidence of ischemia.  No change compared to July 2018  Recommendations:   Patient is here on annual visit and follow-up of coronary artery disease, fortunately he has done really well and has not had any recurrence of angina pectoris, EKG does not reveal any new abnormality.  She does not have his list of medications today with him.  I did go on care everywhere, I tried to reconcile his medications, it appears that he is not on Brilinta, the list does show Brilinta, I have discontinued this.  He is  presently on aspirin alone.  This should be good as for as coronary artery disease and prior stroke is concerned.  He is also on maximum dose of Lipitor for hyperlipidemia.  The list also shows Avapro and also losartan.  Patient is not sure exactly which when he is taking, his blood pressure was elevated today, I would like to go up on the dose of the same however I advised him to follow-up with Dr. Coletta Memos who he has an appointment to see in the next few weeks for follow-up.  Fortunately his lung examination is clear today, last examination is normal, I'll see him back again in 6 months.  I have stressed to him and his wife that they should always carry a list of medications with them.  I have reviewed his labs, lipids and excellent control, CBC normal, renal function a year ago was normal.  Adrian Prows, MD, [facility] 10/08/2018, 2:57 PM [facility] [place] Pager: [phone] Office: [phone] If no answer Cell [phone]

## 2018-11-14 ENCOUNTER — Ambulatory Visit: Payer: Self-pay | Admitting: Cardiology

## 2019-01-07 ENCOUNTER — Ambulatory Visit: Payer: Medicare Other | Admitting: Cardiology

## 2019-01-07 NOTE — Progress Notes (Deleted)
Primary Physician/Referring:  [name], MD  Patient ID: Jonathon [name], male    DOB: [birth year], 68 y.o.   MRN: [id number]  No chief complaint on file.  HPI:    Jonathon [name]  is a 68 y.o.  [name] male patient with history of hypertension, hyperlipidemia, remote stroke without restrictive defect, recent tobacco cessation in September 2018, CAD with PCI in December 2017 to RCA. He also has history of emphysema, pulmonary nodules and right pneumothorax in 2010 due to emphysematous bleb while he was in [place] and has history of right upper lobe lobectomy.  Patient is here on a 3 month visit. He fortunately he remains asymptomatic except for chronic dyspnea and cough.  No recurrence of chest pain. He reports dyspnea has been stable. No chest pain. He has remained abstinant from smoking.  Past Medical History:  Diagnosis Date  . CHF (congestive heart failure) ([place])   . Chronic cough   . COPD, severe ([place])    PT DENIES REGULAR DAILY INHALER ANY PRN  . Coronary artery disease   . CVA (cerebral vascular accident) ([place]) 02/22/2011   LEFT BRAIN STEM PONTINE HEMORRHAGE--  RIGHT SIDED WEAKNESS  . Diabetes mellitus without complication ([place])    type 2  . Dyspnea   . GERD (gastroesophageal reflux disease)   . Harsh voice quality    PT STATES NORMAL FOR HIM  . High cholesterol   . History of CHF (congestive heart failure) MONITORED BY DR BOUSKA   DIASTOLIC  . Hydrocele, left   . Hypertension   . NSTEMI (non-ST elevated myocardial infarction) ([place]) 12/2015  . Pre-operative clearance    GIVEN BY DR Coletta Memos  . Short of breath on exertion   . Smoker   . Weakness of right side of body    S/P CVA   FEB 2013   Past Surgical History:  Procedure Laterality Date  . ABDOMINAL ANGIOGRAM  09/04/2012   Procedure: ABDOMINAL ANGIOGRAM;  Surgeon: Laverda Page, MD;  [facility] C[facility] LAB;  Service: Cardiovascular;;  . CARDIAC CATHETERIZATION  05-02-2011  DR Johnsie Cancel   MILD  NONOBSTRUCTIVE CAD/ LAD, OM , AND D1/ EF 60^  . CARDIAC CATHETERIZATION  AUG 2000   NON-OBSTRUTIVE CAD/ EF 68%/ 25% PROXIMAL LAD/ 20% MID CIRCUMFLEX  . CARDIAC CATHETERIZATION [name] 12/23/2015   Procedure: Left Heart Cath and Coronary Angiography;  Surgeon: Adrian Prows, MD;  Location: [place] CV LAB;  Service: Cardiovascular;  Laterality: [name];  . CARDIAC CATHETERIZATION [name] 12/24/2015   Procedure: Coronary Stent Intervention;  Surgeon: Adrian Prows, MD;  Location: [place] CV LAB;  Service: Cardiovascular;  Laterality: [name];  . CORONARY ANGIOPLASTY    . CORONARY STENT PLACEMENT  12/24/2015    PTCA and stenting of the distal RCA   . HYDROCELE EXCISION Left 04/24/2012   Procedure: HYDROCELECTOMY ADULT;  Surgeon: Fredricka Bonine, MD;  Location: [facility];  Service: Urology;  Laterality: Left;  90 mins req for this case     . [place]  . LEFT AND RIGHT HEART CATHETERIZATION WITH CORONARY ANGIOGRAM [name] 09/04/2012   Procedure: LEFT AND RIGHT HEART CATHETERIZATION WITH CORONARY ANGIOGRAM;  Surgeon: Laverda Page, MD;  Location: [facility] CATH LAB;  Service: Cardiovascular;  Laterality: [name];  . LEFT HEART CATHETERIZATION WITH CORONARY ANGIOGRAM [name] 05/02/2011   Procedure: LEFT HEART CATHETERIZATION WITH CORONARY ANGIOGRAM;  Surgeon: Josue Hector, MD;  Location: [facility] CATH LAB;  Service: Cardiovascular;  Laterality: [name];  .  THORACOTOMY/LOBECTOMY Right 03-06-2008   AND STAPLING OF BLEBS FOR SPONTANOUS PNEUMOTHORAX  . TRANSTHORACIC ECHOCARDIOGRAM  02-17-2011   MODERATE LVH/ LVSF NORMAL/ EF 40-97%/ GRADE I DIASTOLIC DYSFUNCTION   Social History   Socioeconomic History  . Marital status: Married    Spouse name: Not on file  . Number of children: 4  . Years of education: Not on file  . Highest education level: Not on file  Occupational History  . Not on file  Tobacco Use  . Smoking status: Former Smoker    Packs/day: 0.00    Years: 53.00    Pack years: 0.00      Types: Cigarettes    Quit date: 2019    Years since quitting: 1.9  . Smokeless tobacco: Never Used  . Tobacco comment: 3-4 cigarettes daily- 08/30/17  Substance and Sexual Activity  . Alcohol use: Yes    Alcohol/week: 0.0 standard drinks    Comment: 02/10/2016 "nothing in the last 10 years"  . Drug use: No  . Sexual activity: Not on file  Other Topics Concern  . Not on file  Social History Narrative  . Not on file   Social Determinants of Health   Financial Resource Strain:   . Difficulty of Paying Living Expenses: Not on file  Food Insecurity:   . Worried About Charity fundraiser in the Last Year: Not on file  . Ran Out of Food in the Last Year: Not on file  Transportation Needs:   . Lack of Transportation (Medical): Not on file  . Lack of Transportation (Non-Medical): Not on file  Physical Activity:   . Days of Exercise per Week: Not on file  . Minutes of Exercise per Session: Not on file  Stress:   . Feeling of Stress : Not on file  Social Connections:   . Frequency of Communication with Friends and Family: Not on file  . Frequency of Social Gatherings with Friends and Family: Not on file  . Attends Religious Services: Not on file  . Active Member of Clubs or Organizations: Not on file  . Attends Archivist Meetings: Not on file  . Marital Status: Not on file  Intimate Partner Violence:   . Fear of Current or Ex-Partner: Not on file  . Emotionally Abused: Not on file  . Physically Abused: Not on file  . Sexually Abused: Not on file   ROS  Review of Systems  Constitution: Negative for chills, decreased appetite, malaise/fatigue and weight gain.  Cardiovascular: Negative for leg swelling and syncope.  Respiratory: Positive for cough (chronic) and shortness of breath (chronic due to COPD).   Endocrine: Negative for cold intolerance.  Hematologic/Lymphatic: Does not bruise/bleed easily.  Musculoskeletal: Negative for joint swelling.  Gastrointestinal:  Negative for abdominal pain, anorexia, change in bowel habit, hematochezia and melena.  Neurological: Negative for headaches and light-headedness.  Psychiatric/Behavioral: Negative for depression and substance abuse.  All other systems reviewed and are negative.  Objective   Vitals with BMI 10/08/2018 08/30/2017 06/03/2017  Height 5' 11"  5' 11"  -  Weight 211 lbs 219 lbs 6 oz -  BMI 35.32 99.24 -  Systolic 268 341 962  Diastolic 80 76 95  Pulse 73 86 80    There were no vitals taken for this visit. There is no height or weight on file to calculate BMI.   Physical Exam  Constitutional:  Well-built, mildly obese in no acute distress.  HENT:  Head: Atraumatic.  Eyes: Conjunctivae are normal.  Neck: No thyromegaly present.  Cardiovascular: Normal rate, regular rhythm, normal heart sounds and intact distal pulses. Exam reveals no gallop.  No murmur heard. No leg edema, no JVD.   Pulmonary/Chest: Effort normal.  Prolonged expiration bilateral.  Abdominal: Soft. Bowel sounds are normal.  Musculoskeletal:        General: Normal range of motion.     Cervical back: Neck supple.  Neurological: He is alert.  Skin: Skin is warm and dry.  Psychiatric: He has a normal mood and affect.   Radiology: No results found.  Laboratory examination:    POCT A1C (06/27/2018 4:44 PM EDT) POCT A1C (06/27/2018 4:44 PM EDT)  Component Value Ref Range Performed At Pathologist Signature  Hemoglobin A1c 6.9 (A) 4.8 - 5.6 % [facility]    POCT CBC W/DIFF (03/27/2018 4:46 PM EDT) POCT CBC W/DIFF (03/27/2018 4:46 PM EDT)  Component Value Ref Range Performed At Pathologist Signature  WBC 9.1 4.5 - 11.0 x 10^3/uL [facility]    % LYMPH 27.9 25.0 - 40.0 % [facility]    MONO % 6.9 4.0 - 12.0 % [facility]    % GRAN 65.2 50.0 - 73.0 % [facility]    Lymphs Absolute 2.5 1.0 - 4.5 x 10^3/uL [facility]    Monos Absolute 0.6 0.1 - 1.0 x 10^3/uL [facility]    Grans Absolute 5.9 1.5 - 7.5 x 10^3/uL [facility]    RBC 5.90 (A) 4.50 - 5.30 x10E6/uL [facility]    HGB 15.0 13.5 - 17.5 g/dL [facility]    Hematocrit 48.4 40.5 - 52.5 % [facility]    MCV 82.1 (A) 83.0 - 97.0 fL [facility]    MCH 25.3 (A) 26.0 - 34.0 pg [facility]    MCHC 30.9 (A) 31.0 - 37.0 g/dL [facility]    RDW 17.2 (A) 11.9 - 16 % [facility]    Platelet Count 270 150 - 400 x 10^3/uL [facility]     Lipid Panel With LDL/HDL RatioResulted: 03/28/2018 4:35 AM [facility] Component Name Value Ref Range  Cholesterol, Total 137 100 - 199 mg/dL  Triglycerides 102 0 - 149 mg/dL  HDL 54 >39 mg/dL  VLDL Cholesterol Cal 20 5 - 40 mg/dL  LDL 63 0 - 99 mg/dL  LDL/HDL Ratio 1.2    Comprehensive Metabolic PanelResulted: 1/61/0960 9:36 AM [facility] Component Name Value Ref Range  Glucose 110 (H) 65 - 99 mg/dL  BUN 9 8 - 27 mg/dL  Creatinine, Serum 1.04 0.76 - 1.27 mg/dL  eGFR If NonAfrican American 74 >59 mL/min/1.73  eGFR If African American 85 >59 mL/min/1.73  BUN/Creatinine Ratio 9 (L) 10 - 24   Sodium 136 134 - 144 mmol/L  Potassium 4.2     Medications  No Known Allergies   Prior to Admission medications   Medication Sig Start Date End Date Taking? Authorizing Provider  albuterol (PROVENTIL) (2.5 MG/3ML) 0.083% nebulizer solution Take 2.5 mg by nebulization every 4 (four) hours as needed for wheezing or shortness of breath.    [provider]  Albuterol Sulfate (PROAIR RESPICLICK) 454 (90 BASE) MCG/ACT AEPB Inhale 2 puffs into the lungs 4 (four) times daily as needed. 02/24/14  Parrett, Tammy S, NP  amLODipine (NORVASC) 10 MG tablet Take 10 mg by mouth every morning.     [provider]    aspirin EC 325 MG tablet Take 650 mg by mouth every morning.     [provider]  atorvastatin (LIPITOR) 80 MG tablet Take 1 tablet (80 mg total) by mouth every morning. Take 1/2 tablet by mouth every morning Patient taking differently: Take 40 mg by mouth every morning. Take 1/2 tablet by mouth every morning 12/25/15   Theodis Blaze, MD  azithromycin (ZITHROMAX) 250 MG tablet Take 1 tablet (250 mg total) by mouth daily. Stop taking 02/15/2016 02/12/16   Ledell Noss, MD  bisoprolol (ZEBETA) 5 MG tablet Take 1 tablet (5 mg total) by mouth daily. 12/26/15   Theodis Blaze, MD  budesonide-formoterol [facility]) 160-4.5 MCG/ACT inhaler Inhale 2 puffs into the lungs 2 (two) times daily.    [provider]  cloNIDine (CATAPRES) 0.3 MG tablet Take 0.3 mg by mouth 2 (two) times daily.    [provider]  Cyanocobalamin (VITAMIN B 12 PO) Take 1 tablet by mouth every morning.     [provider]  cyclobenzaprine (FLEXERIL) 5 MG tablet Take 5 mg by mouth at bedtime.  02/03/14   [provider]  DULoxetine (CYMBALTA) 30 MG capsule Take 30 mg by mouth every morning.  02/03/14   [provider]  gabapentin (NEURONTIN) 400 MG capsule Take 400 mg by mouth 3 (three) times daily.     [provider]  hydrochlorothiazide (MICROZIDE) 12.5 MG capsule Take 1 capsule (12.5 mg total) by mouth daily. 03/22/18 06/20/18  Adrian Prows, MD  irbesartan (AVAPRO) 150 MG tablet TAKE ONE (1) TABLET BY MOUTH EVERY DAY Patient taking differently: TAKE ONE (1) TABLET BY MOUTH AS NEEDED FOR BP 04/09/15   Tanda Rockers, MD  losartan (COZAAR) 50 MG tablet Take 1 tablet (50 mg total) by mouth daily. 03/22/18 06/20/18  Adrian Prows, MD  metFORMIN (GLUCOPHAGE) 500 MG tablet Take 500 mg by mouth daily with breakfast.    [provider]  nitroGLYCERIN (NITROSTAT) 0.4 MG SL tablet Place 0.4 mg under the tongue every 5 (five) minutes as needed for chest pain.    [provider]   nortriptyline (PAMELOR) 25 MG capsule Take 25 mg by mouth at bedtime.    [provider]  oseltamivir (TAMIFLU) 75 MG capsule Take 1 capsule (75 mg total) by mouth 2 (two) times daily. Stop taking 02/14/2016 02/12/16   Ledell Noss, MD  pantoprazole (PROTONIX) 20 MG tablet TAKE 1 TABLET BY MOUTH DAILY 04/02/18   Adrian Prows, MD  ticagrelor (BRILINTA) 90 MG TABS tablet Take 1 tablet (90 mg total) by mouth 2 (two) times daily. 12/25/15   Theodis Blaze, MD  Tiotropium Bromide Monohydrate (SPIRIVA RESPIMAT) 2.5 MCG/ACT AERS Inhale 2 puffs into the lungs daily. 08/30/17   Mannam, Hart Robinsons, MD  varenicline (CHANTIX PAK) 0.5 MG X 11 & 1 MG X 42 tablet Take one 0.5 mg tablet by mouth once daily for 3 days, then increase to one 0.5 mg tablet twice daily for 4 days, then increase to one 1 mg tablet twice daily. 08/30/17   Mannam, Hart Robinsons, MD  potassium chloride SA (K-DUR,KLOR-CON) 20 MEQ tablet Take 1 tablet (20 mEq total) by mouth 2 (two) times daily. 03/03/11 04/12/11  Bary Leriche, PA-C     Current Outpatient Medications  Medication Instructions  . albuterol (PROVENTIL) 2.5 mg, Nebulization, Every 4  hours PRN  . Albuterol Sulfate (PROAIR RESPICLICK) 063 (90 BASE) MCG/ACT AEPB 2 puffs, Inhalation, 4 times daily PRN  . amLODipine (NORVASC) 10 mg, Oral, BH-each morning  . aspirin EC 650 mg, Oral, BH-each morning  . atorvastatin (LIPITOR) 80 mg, Oral, BH-each morning, Take 1/2 tablet by mouth every morning  . bisoprolol (ZEBETA) 5 mg, Oral, Daily  . budesonide-formoterol (SYMBICORT) 160-4.5 MCG/ACT inhaler 2 puffs, Inhalation, 2 times daily  . cloNIDine (CATAPRES) 0.3 mg, 2 times daily  . Cyanocobalamin (VITAMIN B 12 PO) 1 tablet, Oral, BH-each morning  . cyclobenzaprine (FLEXERIL) 5 mg, Oral, Daily at bedtime  . DULoxetine (CYMBALTA) 30 mg, Oral, BH-each morning  . gabapentin (NEURONTIN) 400 mg, Oral, 3 times daily  . hydrochlorothiazide (MICROZIDE) 12.5 mg, Oral, Daily  . irbesartan (AVAPRO) 150 MG  tablet TAKE ONE (1) TABLET BY MOUTH EVERY DAY  . losartan (COZAAR) 50 mg, Oral, Daily  . metFORMIN (GLUCOPHAGE) 500 mg, Daily with breakfast  . nitroGLYCERIN (NITROSTAT) 0.4 mg, Sublingual, Every 5 min PRN  . nortriptyline (PAMELOR) 25 mg, Daily at bedtime  . pantoprazole (PROTONIX) 20 MG tablet TAKE 1 TABLET BY MOUTH DAILY  . ticagrelor (BRILINTA) 90 mg, Oral, 2 times daily  . Tiotropium Bromide Monohydrate (SPIRIVA RESPIMAT) 2.5 MCG/ACT AERS 2 puffs, Inhalation, Daily    Cardiac Studies:   Coronary angiogram 12/24/2015: PTCA and stenting of the distal RCA with 4.0 x 12 mm onyx DES. Normal LVEF. Mild disease in other vessels.  Lexiscan myoview stress test 06/19/2017:  1. Lexiscan stress test was performed. Exercise capacity was not assessed. Stress symptoms included shortness of breath, lightheadedness, and chest pressure. Blood pressure was 126/78 mmHg. Stress EKG is non diagnostic for ischemia as it is a pharmacologic stress. In addition, it showed normal sinus rhythm, normal stress conduction, no stress arrhythmias and normal stress repolarization.   2. The overall quality of the study is good. There is no evidence of abnormal lung activity. Stress and rest SPECT images demonstrate homogeneous tracer distribution throughout the myocardium. Gated SPECT imaging reveals normal myocardial thickening and wall motion. The left ventricular ejection fraction was calculated as 43%, however visually appears normal. 3. Low to intermediate risk study.  Echocardiogram 08/08/2017:  Left ventricle cavity is normal in size. Mild concentric hypertrophy of the left ventricle. Normal global wall motion. Normal diastolic filling pattern. Calculated EF 52%. Mild to moderate aortic regurgitation. Mild (Grade I) mitral regurgitation. Inadequate tricuspid regurgitation jet to estimate pulmonary artery pressure. Normal right atrial pressure. No significant change compared to previous study on  01/06/2016.  Assessment     ICD-10-CM   1. Coronary artery disease involving native coronary artery of native heart without angina pectoris  I25.10   2. Essential hypertension, benign  I10   3. H/O stroke without residual deficits  Z86.73     EKG 10/08/2018: Normal sinus rhythm at rate of 67 bpm, poor R-wave progression, cannot exclude anteroseptal infarct old.  Nonspecific T abnormality.  EKG 06/14/2017: Normal sinus rhythm at 83 bpm, normal axis, no evidence of ischemia.  No change compared to July 2018  Recommendations:   Jonathon [name]  is a 68 y.o. [name] male patient with history of hypertension, hyperlipidemia, remote stroke without restrictive defect, recent tobacco cessation in September 2018, CAD with PCI in December 2017 to RCA. He also has history of emphysema, pulmonary nodules and right pneumothorax in 2010 due to emphysematous bleb while he was in [place] and has history of right upper lobe lobectomy.  On his last office visit 3 months ago, I was very concerned that he did not know his medications well.  I am bringing him back to reevaluate his medications.  Fortunately his labs including renal function, lipids are well controlled and he has remained abstinent from tobacco use as well.  Today there is no clinical evidence of heart failure.  I encouraged him to continue to read healthy lifestyle.  I will see him back on an annual basis.  Adrian Prows, MD, [facility] 01/07/2019, 1:10 PM [facility] [place] Pager: [phone] Office: [phone] If no answer Cell [phone]

## 2019-01-09 ENCOUNTER — Telehealth: Payer: Self-pay

## 2019-01-09 DIAGNOSIS — R918 Other nonspecific abnormal finding of lung field: Secondary | ICD-10-CM

## 2019-01-09 NOTE — Telephone Encounter (Signed)
LMTCB x1. I will place order for CT scan.

## 2019-01-09 NOTE — Telephone Encounter (Signed)
-----   Message from Marshell Garfinkel, MD sent at 01/09/2019 11:53 AM EST ----- Regarding: Follow up Patient was lost to follow up.  He will need to be scheduled for CT chest to follow luing nodules and clinic visit. Please arrange. Thanks  Marshell Garfinkel MD [facility] 01/09/2019, 11:54 AM

## 2019-01-10 NOTE — Telephone Encounter (Signed)
LMTCB x2  

## 2019-01-14 NOTE — Telephone Encounter (Signed)
LMTCB x 3 

## 2019-01-15 ENCOUNTER — Telehealth: Payer: Self-pay | Admitting: Pulmonary Disease

## 2019-01-15 NOTE — Telephone Encounter (Signed)
Spoke with pt and his wife, I advised them that Dr. Vaughan Browner wanted to schedule a CT scan for pt. Please call the number [phone] to get in contact with pt to schedule CT scan. FYI PCC's

## 2019-01-15 NOTE — Telephone Encounter (Signed)
Several attempts have been made to contact patient and CT has been ordered. Unable to get in contact with patient.

## 2019-01-16 NOTE — Telephone Encounter (Signed)
Spoke to pt's wife and gave her appt info.  Nothing further needed.

## 2019-01-16 NOTE — Telephone Encounter (Signed)
I have scheduled Chest CT for pt.  I had to leave a vm for pt to call me back for appt info.

## 2019-01-24 ENCOUNTER — Ambulatory Visit
Admission: RE | Admit: 2019-01-24 | Discharge: 2019-01-24 | Disposition: A | Discharge status: Home / Self Care | Payer: Medicare Other | Source: Ambulatory Visit | Attending: Pulmonary Disease | Admitting: Pulmonary Disease

## 2019-01-24 ENCOUNTER — Other Ambulatory Visit: Payer: Self-pay

## 2019-01-24 DIAGNOSIS — R918 Other nonspecific abnormal finding of lung field: Secondary | ICD-10-CM

## 2019-01-29 ENCOUNTER — Other Ambulatory Visit: Payer: Self-pay | Admitting: Pulmonary Disease

## 2019-01-29 DIAGNOSIS — R918 Other nonspecific abnormal finding of lung field: Secondary | ICD-10-CM

## 2019-02-01 ENCOUNTER — Ambulatory Visit (INDEPENDENT_AMBULATORY_CARE_PROVIDER_SITE_OTHER): Payer: Medicare Other | Admitting: Cardiology

## 2019-02-01 ENCOUNTER — Other Ambulatory Visit: Payer: Self-pay

## 2019-02-01 ENCOUNTER — Encounter: Payer: Self-pay | Admitting: Cardiology

## 2019-02-01 ENCOUNTER — Telehealth: Payer: Self-pay

## 2019-02-01 VITALS — BP 139/82 | HR 54 | Temp 98.0°F | Resp 16 | Ht 71.0 in | Wt 210.8 lb

## 2019-02-01 DIAGNOSIS — J432 Centrilobular emphysema: Secondary | ICD-10-CM | POA: Diagnosis not present

## 2019-02-01 DIAGNOSIS — I10 Essential (primary) hypertension: Secondary | ICD-10-CM | POA: Diagnosis not present

## 2019-02-01 DIAGNOSIS — I7123 Aneurysm of the descending thoracic aorta, without rupture: Secondary | ICD-10-CM

## 2019-02-01 DIAGNOSIS — I712 Thoracic aortic aneurysm, without rupture: Secondary | ICD-10-CM | POA: Diagnosis not present

## 2019-02-01 DIAGNOSIS — I2510 Atherosclerotic heart disease of native coronary artery without angina pectoris: Secondary | ICD-10-CM

## 2019-02-01 DIAGNOSIS — E7800 Pure hypercholesterolemia, unspecified: Secondary | ICD-10-CM

## 2019-02-01 NOTE — Progress Notes (Signed)
Primary Physician/Referring:  [name], MD  Patient ID: Jonathon [name], male    DOB: 1950-10-27, 69 y.o.   MRN: [id number]  Chief Complaint  Patient presents with  . Coronary Artery Disease    6 month Follow up   HPI:    Jonathon [name]  is a 69 y.o.  [name] male patient with history of hypertension, hyperlipidemia, remote stroke without restrictive defect, recent tobacco cessation in September 2018, CAD with PCI in December 2017 to RCA. He also has history of emphysema, pulmonary nodules and right pneumothorax in 2010 due to emphysematous bleb while he was in [place] and has history of right upper lobe lobectomy.  Patient is here on a 1 month visit. He fortunately he remains asymptomatic except for chronic dyspnea and cough.  No recurrence of chest pain. He reports dyspnea has been stable. No chest pain. He has remained abstinant from smoking. He is concerned about recent abdominal CT scan.  He brings in all his medications today.  Past Medical History:  Diagnosis Date  . CHF (congestive heart failure) ([place])   . Chronic cough   . COPD, severe ([place])    PT DENIES REGULAR DAILY INHALER ANY PRN  . Coronary artery disease   . CVA (cerebral vascular accident) ([place]) 02/22/2011   LEFT BRAIN STEM PONTINE HEMORRHAGE--  RIGHT SIDED WEAKNESS  . Diabetes mellitus without complication ([place])    type 2  . Dyspnea   . GERD (gastroesophageal reflux disease)   . Harsh voice quality    PT STATES NORMAL FOR HIM  . High cholesterol   . History of CHF (congestive heart failure) MONITORED BY DR BOUSKA   DIASTOLIC  . Hydrocele, left   . Hypertension   . NSTEMI (non-ST elevated myocardial infarction) ([place]) 12/2015  . Pre-operative clearance    GIVEN BY DR Coletta Memos  . Short of breath on exertion   . Smoker   . Weakness of right side of body    S/P CVA   FEB 2013   Past Surgical History:  Procedure Laterality Date  . ABDOMINAL ANGIOGRAM  09/04/2012   Procedure: ABDOMINAL  ANGIOGRAM;  Surgeon: Laverda Page, MD;  Location: [facility] CATH LAB;  Service: Cardiovascular;;  . CARDIAC CATHETERIZATION  05-02-2011  DR Johnsie Cancel   MILD NONOBSTRUCTIVE CAD/ LAD, OM , AND D1/ EF 60^  . CARDIAC CATHETERIZATION  AUG 2000   NON-OBSTRUTIVE CAD/ EF 68%/ 25% PROXIMAL LAD/ 20% MID CIRCUMFLEX  . CARDIAC CATHETERIZATION [name] 12/23/2015   Procedure: Left Heart Cath and Coronary Angiography;  Surgeon: Adrian Prows, MD;  Location: [place] CV LAB;  Service: Cardiovascular;  Laterality: [name];  . CARDIAC CATHETERIZATION [name] 12/24/2015   Procedure: Coronary Stent Intervention;  Surgeon: Adrian Prows, MD;  Location: [place] CV LAB;  Service: Cardiovascular;  Laterality: [name];  . CORONARY ANGIOPLASTY    . CORONARY STENT PLACEMENT  12/24/2015    PTCA and stenting of the distal RCA   . HYDROCELE EXCISION Left 04/24/2012   Procedure: HYDROCELECTOMY ADULT;  Surgeon: Fredricka Bonine, MD;  Location: [facility];  Service: Urology;  Laterality: Left;  90 mins req for this case     . [place]  . LEFT AND RIGHT HEART CATHETERIZATION WITH CORONARY ANGIOGRAM [name] 09/04/2012   Procedure: LEFT AND RIGHT HEART CATHETERIZATION WITH CORONARY ANGIOGRAM;  Surgeon: Laverda Page, MD;  Location: [facility] CATH LAB;  Service: Cardiovascular;  Laterality: [name];  . LEFT HEART CATHETERIZATION WITH  CORONARY ANGIOGRAM [name] 05/02/2011   Procedure: LEFT HEART CATHETERIZATION WITH CORONARY ANGIOGRAM;  Surgeon: Josue Hector, MD;  Location: [facility] CATH LAB;  Service: Cardiovascular;  Laterality: [name];  . THORACOTOMY/LOBECTOMY Right 03-06-2008   AND STAPLING OF BLEBS FOR SPONTANOUS PNEUMOTHORAX  . TRANSTHORACIC ECHOCARDIOGRAM  02-17-2011   MODERATE LVH/ LVSF NORMAL/ EF 34-74%/ GRADE I DIASTOLIC DYSFUNCTION   Social History   Socioeconomic History  . Marital status: Married    Spouse name: Not on file  . Number of children: 4  . Years of education: Not on file  . Highest education level: Not  on file  Occupational History  . Not on file  Tobacco Use  . Smoking status: Former Smoker    Packs/day: 0.00    Years: 53.00    Pack years: 0.00    Types: Cigarettes    Quit date: 2019    Years since quitting: 2.0  . Smokeless tobacco: Never Used  . Tobacco comment: 3-4 cigarettes daily- 08/30/17  Substance and Sexual Activity  . Alcohol use: Yes    Alcohol/week: 0.0 standard drinks    Comment: 02/10/2016 "nothing in the last 10 years"  . Drug use: No  . Sexual activity: Not on file  Other Topics Concern  . Not on file  Social History Narrative  . Not on file   Social Determinants of Health   Financial Resource Strain:   . Difficulty of Paying Living Expenses: Not on file  Food Insecurity:   . Worried About Charity fundraiser in the Last Year: Not on file  . Ran Out of Food in the Last Year: Not on file  Transportation Needs:   . Lack of Transportation (Medical): Not on file  . Lack of Transportation (Non-Medical): Not on file  Physical Activity:   . Days of Exercise per Week: Not on file  . Minutes of Exercise per Session: Not on file  Stress:   . Feeling of Stress : Not on file  Social Connections:   . Frequency of Communication with Friends and Family: Not on file  . Frequency of Social Gatherings with Friends and Family: Not on file  . Attends Religious Services: Not on file  . Active Member of Clubs or Organizations: Not on file  . Attends Archivist Meetings: Not on file  . Marital Status: Not on file  Intimate Partner Violence:   . Fear of Current or Ex-Partner: Not on file  . Emotionally Abused: Not on file  . Physically Abused: Not on file  . Sexually Abused: Not on file   ROS  Review of Systems  Constitution: Negative for chills, decreased appetite, malaise/fatigue and weight gain.  Cardiovascular: Negative for leg swelling and syncope.  Respiratory: Positive for cough (chronic) and shortness of breath (chronic due to COPD).   Endocrine:  Negative for cold intolerance.  Hematologic/Lymphatic: Does not bruise/bleed easily.  Musculoskeletal: Negative for joint swelling.  Gastrointestinal: Negative for abdominal pain, anorexia, change in bowel habit, hematochezia and melena.  Neurological: Negative for headaches and light-headedness.  Psychiatric/Behavioral: Negative for depression and substance abuse.  All other systems reviewed and are negative.  Objective   Vitals with BMI 02/01/2019 10/08/2018 08/30/2017  Height 5' 11"  5' 11"  5' 11"   Weight 210 lbs 13 oz 211 lbs 219 lbs 6 oz  BMI 29.41 25.95 63.87  Systolic 564 332 951  Diastolic 82 80 76  Pulse 54 73 86    Blood pressure 139/82, pulse (!) 54, temperature 98  F (36.7 C), temperature source Temporal, resp. rate 16, height 5' 11"  (1.803 m), weight 210 lb 12.8 oz (95.6 kg), SpO2 95 %. Body mass index is 29.4 kg/m.   Physical Exam  Constitutional:  Well-built, mildly obese in no acute distress.  HENT:  Head: Atraumatic.  Eyes: Conjunctivae are normal.  Neck: No thyromegaly present.  Cardiovascular: Normal rate, regular rhythm, normal heart sounds and intact distal pulses. Exam reveals no gallop.  No murmur heard. No leg edema, no JVD.   Pulmonary/Chest: Effort normal.  Prolonged expiration bilateral.  Abdominal: Soft. Bowel sounds are normal.  Musculoskeletal:        General: Normal range of motion.     Cervical back: Neck supple.  Neurological: He is alert.  Skin: Skin is warm and dry.  Psychiatric: He has a normal mood and affect.   Radiology: No results found.  Laboratory examination:   POCT A1C (06/27/2018 4:44 PM EDT)  Component Value Ref Range Performed At Pathologist Signature  Hemoglobin A1c 6.9 (A) 4.8 - 5.6 % [facility]    POCT CBC W/DIFF (03/27/2018 4:46 PM EDT) POCT CBC W/DIFF (03/27/2018 4:46 PM EDT)  Component Value Ref Range Performed At Pathologist Signature  WBC 9.1 4.5 - 11.0 x 10^3/uL [facility]    % LYMPH 27.9 25.0 - 40.0 % [facility]    MONO % 6.9 4.0 - 12.0 % [facility]    % GRAN 65.2 50.0 - 73.0 % [facility]    Lymphs Absolute 2.5 1.0 - 4.5 x 10^3/uL [facility]    Monos Absolute 0.6 0.1 - 1.0 x 10^3/uL [facility]    Grans Absolute 5.9 1.5 - 7.5 x 10^3/uL [facility]    RBC 5.90 (A) 4.50 - 5.30 x10E6/uL [facility]    HGB 15.0 13.5 - 17.5 g/dL [facility]    Hematocrit 48.4 40.5 - 52.5 % [facility]    MCV 82.1 (A) 83.0 - 97.0 fL [facility]    MCH 25.3 (A) 26.0 - 34.0 pg [facility]    MCHC 30.9 (A) 31.0 - 37.0 g/dL [facility]    RDW 17.2 (A) 11.9 - 16 % [facility]    Platelet Count 270 150 - 400 x 10^3/uL [facility]     Lipid Panel With LDL/HDL RatioResulted: 03/28/2018 4:35 AM [facility] Component Name Value Ref Range  Cholesterol, Total 137 100 - 199 mg/dL  Triglycerides 102 0 - 149 mg/dL  HDL 54 >39 mg/dL  VLDL Cholesterol Cal 20 5 - 40 mg/dL  LDL 63 0 - 99 mg/dL  LDL/HDL Ratio 1.2    Comprehensive Metabolic PanelResulted: 5/39/7673 9:36 AM [facility] Component Name Value Ref Range  Glucose 110 (H) 65 - 99 mg/dL  BUN 9 8 - 27 mg/dL  Creatinine, Serum 1.04 0.76 - 1.27 mg/dL  eGFR If NonAfrican American 74 >59 mL/min/1.73  eGFR If African American 85 >59 mL/min/1.73  BUN/Creatinine Ratio 9 (L) 10 - 24   Sodium 136 134 - 144 mmol/L  Potassium 4.2     Medications  No Known Allergies    Current Outpatient Medications  Medication Instructions  . albuterol (PROVENTIL) 2.5 mg, Nebulization, Every 4 hours PRN  . Albuterol Sulfate (  PROAIR RESPICLICK) 497 (90 BASE) MCG/ACT AEPB 2 puffs, Inhalation, 4 times daily PRN  . amLODipine (NORVASC) 10 mg, Oral, BH-each morning  .  aspirin EC 650 mg, Oral, BH-each morning  . bisoprolol (ZEBETA) 5 mg, Oral, Daily  . budesonide-formoterol (SYMBICORT) 160-4.5 MCG/ACT inhaler 2 puffs, Inhalation, 2 times daily  . cloNIDine (CATAPRES) 0.3 mg, 2 times daily  . Cyanocobalamin (VITAMIN B 12 PO) 1 tablet, Oral, BH-each morning  . cyclobenzaprine (FLEXERIL) 5 mg, Oral, Daily at bedtime  . DULoxetine (CYMBALTA) 30 mg, Oral, BH-each morning  . gabapentin (NEURONTIN) 400 mg, Oral, 3 times daily  . hydrochlorothiazide (MICROZIDE) 12.5 mg, Oral, Daily  . irbesartan (AVAPRO) 150 MG tablet TAKE ONE (1) TABLET BY MOUTH EVERY DAY  . losartan (COZAAR) 50 mg, Oral, Daily  . metFORMIN (GLUCOPHAGE) 500 mg, Daily with breakfast  . nitroGLYCERIN (NITROSTAT) 0.4 mg, Sublingual, Every 5 min PRN  . nortriptyline (PAMELOR) 25 mg, Daily at bedtime  . pantoprazole (PROTONIX) 20 MG tablet TAKE 1 TABLET BY MOUTH DAILY  . ticagrelor (BRILINTA) 90 mg, Oral, 2 times daily  . Tiotropium Bromide Monohydrate (SPIRIVA RESPIMAT) 2.5 MCG/ACT AERS 2 puffs, Inhalation, Daily   Radiology 01/24/2019: 1. There is a new, lobulated subpleural nodule of the medial right lower lobe overlying the right aspect of the T8 vertebral body and measuring 1.7 x 1.6 cm. This is concerning for malignancy, however given the recent history of fluctuating subpleural opacities in the right lung and very rapid interval development this may reflect infection or inflammation. Prior history of malignancy does however significantly raise suspicion. Consider PET-CT to characterize for metabolic activity. Absolute minimum, recommend follow-up CT in 3 months to assess for stability resolution. 2. Previously described irregular subpleural opacity of the right lower lobe has resolved. 3. Stable dense paramedian nodule or consolidation of the lingula, previously without FDG avidity. 4. Redemonstrated postoperative findings of right upper lobectomy. 5. Severe, bullous emphysema.   Emphysema (ICD10-J43.9). 6. Three-vessel coronary artery calcifications. 7. Aortic Atherosclerosis (ICD10-I70.0). Unchanged enlargement of the descending thoracic aorta, measuring up to 3.3 x 3.3 cm superiorly. Normal caliber of the remaining portions of the vessel.   Cardiac Studies:   Coronary angiogram 12/24/2015: PTCA and stenting of the distal RCA with 4.0 x 12 mm onyx DES. Normal LVEF. Mild disease in other vessels.  Lexiscan myoview stress test 06/19/2017:  1. Lexiscan stress test was performed. Exercise capacity was not assessed. Stress symptoms included shortness of breath, lightheadedness, and chest pressure. Blood pressure was 126/78 mmHg. Stress EKG is non diagnostic for ischemia as it is a pharmacologic stress. In addition, it showed normal sinus rhythm, normal stress conduction, no stress arrhythmias and normal stress repolarization.   2. The overall quality of the study is good. There is no evidence of abnormal lung activity. Stress and rest SPECT images demonstrate homogeneous tracer distribution throughout the myocardium. Gated SPECT imaging reveals normal myocardial thickening and wall motion. The left ventricular ejection fraction was calculated as 43%, however visually appears normal. 3. Low to intermediate risk study.  Echocardiogram 08/08/2017:  Left ventricle cavity is normal in size. Mild concentric hypertrophy of the left ventricle. Normal global wall motion. Normal diastolic filling pattern. Calculated EF 52%. Mild to moderate aortic regurgitation. Mild (Grade I) mitral regurgitation. Inadequate tricuspid regurgitation jet to estimate pulmonary artery pressure. Normal right atrial pressure. No significant change compared to previous study on 01/06/2016.  Assessment     ICD-10-CM   1. Coronary artery disease involving native coronary artery of  native heart without angina pectoris  I25.10   2. Descending thoracic aortic aneurysm (HCC) 3.3 cm 2021 CT  I71.2   3.  Essential hypertension, benign  I10   4. Centrilobular emphysema ([place])  J43.2   5. Hypercholesteremia  E78.00     EKG 10/08/2018: Normal sinus rhythm at rate of 67 bpm, poor R-wave progression, cannot exclude anteroseptal infarct old.  Nonspecific T abnormality.  EKG 06/14/2017: Normal sinus rhythm at 83 bpm, normal axis, no evidence of ischemia.  No change compared to July 2018  Recommendations:   Yosgar A Limas  is a 69 y.o. [name] male patient with history of hypertension, hyperlipidemia, remote stroke without restrictive defect, recent tobacco cessation in September 2018, CAD with PCI in December 2017 to RCA. He also has history of emphysema, pulmonary nodules and right pneumothorax in 2010 due to emphysematous bleb while he was in [place] and has history of right upper lobe lobectomy.   Recent CT reviewed, he also has a small descending thoracic aneurysm and now being worked up for malignancy vs inflammation.  I have discussed that he will need all the medications for hypertension control in view of prior stroke, aortic aneurysm and coronary artery disease.  Except for dyspnea related to underlying COPD he remains angina free.  His lipids are also well controlled.  I updated his medications, also encouraged him to continue to lose weight.  I will see him back on an annual basis.   Adrian Prows, MD, [facility] 02/01/2019, 2:45 PM [facility]. [place] Pager: [phone] Office: [phone] If no answer Cell [phone]

## 2019-02-07 ENCOUNTER — Other Ambulatory Visit: Payer: Self-pay

## 2019-02-07 ENCOUNTER — Encounter (HOSPITAL_COMMUNITY)
Admission: RE | Admit: 2019-02-07 | Discharge: 2019-02-07 | Disposition: A | Discharge status: Home / Self Care | Payer: Medicare Other | Source: Ambulatory Visit | Attending: Pulmonary Disease | Admitting: Pulmonary Disease

## 2019-02-07 DIAGNOSIS — K8020 Calculus of gallbladder without cholecystitis without obstruction: Secondary | ICD-10-CM | POA: Insufficient documentation

## 2019-02-07 DIAGNOSIS — R918 Other nonspecific abnormal finding of lung field: Secondary | ICD-10-CM | POA: Diagnosis not present

## 2019-02-07 DIAGNOSIS — J439 Emphysema, unspecified: Secondary | ICD-10-CM | POA: Insufficient documentation

## 2019-02-07 DIAGNOSIS — I2510 Atherosclerotic heart disease of native coronary artery without angina pectoris: Secondary | ICD-10-CM | POA: Insufficient documentation

## 2019-02-07 DIAGNOSIS — Z902 Acquired absence of lung [part of]: Secondary | ICD-10-CM | POA: Diagnosis not present

## 2019-02-07 DIAGNOSIS — I700 Atherosclerosis of aorta: Secondary | ICD-10-CM | POA: Insufficient documentation

## 2019-02-07 LAB — GLUCOSE, CAPILLARY: Glucose-Capillary: 134 mg/dL — ABNORMAL HIGH (ref 70–99)

## 2019-02-07 MED ORDER — FLUDEOXYGLUCOSE F - 18 (FDG) INJECTION: 11.0000 | Freq: Once | INTRAVENOUS | Status: DC | PRN

## 2019-02-15 ENCOUNTER — Ambulatory Visit (INDEPENDENT_AMBULATORY_CARE_PROVIDER_SITE_OTHER): Payer: Medicare Other | Admitting: Pulmonary Disease

## 2019-02-15 ENCOUNTER — Encounter: Payer: Self-pay | Admitting: Pulmonary Disease

## 2019-02-15 ENCOUNTER — Other Ambulatory Visit: Payer: Self-pay

## 2019-02-15 VITALS — BP 118/64 | HR 60 | Temp 97.5°F | Ht 72.0 in | Wt 216.8 lb

## 2019-02-15 DIAGNOSIS — J449 Chronic obstructive pulmonary disease, unspecified: Secondary | ICD-10-CM | POA: Diagnosis not present

## 2019-02-15 DIAGNOSIS — R918 Other nonspecific abnormal finding of lung field: Secondary | ICD-10-CM

## 2019-02-15 MED ORDER — SPIRIVA RESPIMAT 2.5 MCG/ACT IN AERS: 2.0000 | INHALATION_SPRAY | Freq: Every day | RESPIRATORY_TRACT | 5 refills | Status: DC

## 2019-02-15 NOTE — Patient Instructions (Signed)
I have reviewed your scans which show an area in the right side of the lung which has grown and is concerning for malignancy In office today we have reviewed these results and next steps We are awaiting discussion of your case at our multidisciplinary conference to determine the next best option for testing or treatment I will call you next week as soon as I find out about the recommendations and update you Follow-up in 3 months

## 2019-02-15 NOTE — Progress Notes (Signed)
Jonathon [name]    [id number]    [birth year]  Primary Care Physician:Bouska, Shanon Brow, MD  Referring Physician: [name], MD [address] [place],  [place] [id number]  Chief complaint: Follow-up for COPD, asthma, lung opacity  HPI: 69 year old active smoker with severe COPD, asthma, spontaneous pneumothorax, coronary artery disease, CVA History noted for right spontaneous pneumothorax in 2010 in [place] where he underwent right upper lobectomy for bullous disease.  Also follows with Dr. [name] for coronary artery disease  Pets: Has outdoor cat, rooster, dog.  No indoor pets. 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  Continues to smoke occasionally Travel history: Originally from [place].  Lived in [place], [place] years ago Relevant family history: No significant family history of lung issues.  Interim history: Follow-up for COPD, abnormal CT Has had a CT and PET scan for evaluation of right lower lobe lung nodule. Has chronic dyspnea on exertion, cough with white mucus.  Continues to smoke  Outpatient Encounter Medications as of 02/15/2019  Medication Sig  . albuterol (PROVENTIL) (2.5 MG/3ML) 0.083% nebulizer solution Take 2.5 mg by nebulization every 4 (four) hours as needed for wheezing or shortness of breath.  . Albuterol Sulfate (PROAIR RESPICLICK) 539 (90 BASE) MCG/ACT AEPB Inhale 2 puffs into the lungs 4 (four) times daily as needed.  Marland Kitchen amLODipine (NORVASC) 10 MG tablet Take 10 mg by mouth every morning.   Marland Kitchen aspirin EC 325 MG tablet Take 650 mg by mouth every morning.   . bisoprolol (ZEBETA) 5 MG tablet Take 1 tablet (5 mg total) by mouth daily.  . budesonide-formoterol (SYMBICORT) 160-4.5 MCG/ACT inhaler Inhale 2 puffs into the lungs 2 (two) times daily.  .  cloNIDine (CATAPRES) 0.3 MG tablet Take 0.3 mg by mouth 2 (two) times daily.  . Cyanocobalamin (VITAMIN B 12 PO) Take 1 tablet by mouth every morning.   . cyclobenzaprine (FLEXERIL) 5 MG tablet Take 5 mg by mouth at bedtime.   . DULoxetine (CYMBALTA) 30 MG capsule Take 30 mg by mouth every morning.   . gabapentin (NEURONTIN) 400 MG capsule Take 400 mg by mouth 3 (three) times daily.   . hydrochlorothiazide (MICROZIDE) 12.5 MG capsule Take 1 capsule (12.5 mg total) by mouth daily.  . irbesartan (AVAPRO) 150 MG tablet TAKE ONE (1) TABLET BY MOUTH EVERY DAY (Patient taking differently: TAKE ONE (1) TABLET BY MOUTH AS NEEDED FOR BP)  . losartan (COZAAR) 50 MG tablet Take 1 tablet (50 mg total) by mouth daily.  . metFORMIN (GLUCOPHAGE) 500 MG tablet Take 500 mg by mouth daily with breakfast.  . nitroGLYCERIN (NITROSTAT) 0.4 MG SL tablet Place 0.4 mg under the tongue every 5 (five) minutes as needed for chest pain.  . nortriptyline (PAMELOR) 25 MG capsule Take 25 mg by mouth at bedtime.  . pantoprazole (PROTONIX) 20 MG tablet TAKE 1 TABLET BY MOUTH DAILY  . tamsulosin (FLOMAX) 0.4 MG CAPS capsule Take 0.4 mg by mouth daily.  . ticagrelor (BRILINTA) 90 MG TABS tablet Take 1 tablet (90 mg total) by mouth 2 (two) times daily.  . Tiotropium Bromide Monohydrate (SPIRIVA RESPIMAT) 2.5 MCG/ACT AERS Inhale 2 puffs into the lungs daily.  . [DISCONTINUED] Tiotropium Bromide Monohydrate (SPIRIVA RESPIMAT) 2.5 MCG/ACT AERS Inhale 2 puffs into the lungs  daily.  . [DISCONTINUED] potassium chloride SA (K-DUR,KLOR-CON) 20 MEQ tablet Take 1 tablet (20 mEq total) by mouth 2 (two) times daily.   No facility-administered encounter medications on file as of 02/15/2019.    Allergies as of 02/15/2019  . (No Known Allergies)   Physical Exam: Blood pressure 118/64, pulse 60, temperature (!) 97.5 F (36.4 C), temperature source Temporal, height 6' (1.829 m), weight 216 lb 12.8 oz (98.3 kg), SpO2 96 %. Gen:      No acute  distress HEENT:  EOMI, sclera anicteric Neck:     No masses; no thyromegaly Lungs:    Clear to auscultation bilaterally; normal respiratory effort CV:         Regular rate and rhythm; no murmurs Abd:      + bowel sounds; soft, non-tender; no palpable masses, no distension Ext:    No edema; adequate peripheral perfusion Skin:      Warm and dry; no rash Neuro: alert and oriented x 3 Psych: normal mood and affect  Data Reviewed: Imaging:  CT 02/10/2016-no pulmonary embolism, extensive bilateral emphysematous changes, bullous disease.  Possible growth of the lingular lesion.  CT chest 09/07/2017-stable lingula, 1.5 cm right upper lobe pleural-based nodule  PET scan 09/29/2017-low-level uptake in pleural-based right lung nodule  CT chest 01/24/2019-new subpleural nodule in the medial lateral right lower lobe.  PET scan 02/07/2019-right lower lobe lung nodule with high uptake suspicious for malignancy. I have reviewed the images personally  PFTs 11/05/2013 FVC 2.88 [64%], FEV1 1.50 [44%), F/F 52 Moderate-severe obstruction with marked bronchodilator response Moderate diffusion defect.  Assessment:  Right lower lobe lung nodule High uptake on PET scan which is suspicious for malignancy It is at a place that would be difficult to biopsy as it surrounded by emphysematous lung Referred to multidisciplinary tumor board for evaluation.  He may need to go straight to radiation therapy without biopsy.  Severe emphysematous COPD, asthma Currently on Symbicort.  Will add Spiriva Respimat Reevaluate with CBC differential, IgE, alpha-1 antitrypsin levels and phenotype  Abnormal CT, lingular mass Continue Symbicort, Spiriva  Active smoker Cessation encouraged  This appointment required 40 minutes of patient care (this includes precharting, chart review, review of results, face-to-face care, etc.). Plan/Recommendations: - Continue Symbicort, Spiriva - Discussion at tumor board  Marshell Garfinkel  MD [facility] 02/15/2019, 2:48 PM  CC: [name], MD

## 2019-02-15 NOTE — Progress Notes (Signed)
Jonathon [name]    [id number]    02-05-1950  Primary Care Physician:Bouska, Shanon Brow, MD  Referring Physician: [name], MD [place] [place],  [place] [address]  Chief complaint: Follow-up for COPD, asthma, lung opacity  HPI: 69 year old active smoker with severe COPD, asthma, spontaneous pneumothorax, coronary artery disease, CVA Previously followed by Dr. Melvyn Novas, maintained on the Symbicort. Complains of stable dyspnea on exertion, cough with mucus production.  No fevers, chills  History noted for right spontaneous pneumothorax in 2010 in [place] where he underwent right upper lobectomy for bullous disease.  Also follows with Dr. [name] for coronary artery disease  Pets: Has outdoor cat, rooster, dog.  No indoor pets. 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  Continues to smoke occasionally Travel history: Originally from [place].  Lived in [place], middle [place] to [place] 30 years ago Relevant family history: No significant family history of lung issues.  Outpatient Encounter Medications as of 02/15/2019  Medication Sig  . albuterol (PROVENTIL) (2.5 MG/3ML) 0.083% nebulizer solution Take 2.5 mg by nebulization every 4 (four) hours as needed for wheezing or shortness of breath.  . Albuterol Sulfate (PROAIR RESPICLICK) 485 (90 BASE) MCG/ACT AEPB Inhale 2 puffs into the lungs 4 (four) times daily as needed.  Marland Kitchen amLODipine (NORVASC) 10 MG tablet Take 10 mg by mouth every morning.   Marland Kitchen aspirin EC 325 MG tablet Take 650 mg by mouth every morning.   . bisoprolol (ZEBETA) 5 MG tablet Take 1 tablet (5 mg total) by mouth daily.  . budesonide-formoterol (SYMBICORT) 160-4.5 MCG/ACT inhaler Inhale 2 puffs into the lungs 2 (two) times daily.  . cloNIDine (CATAPRES) 0.3 MG tablet Take 0.3 mg by mouth 2  (two) times daily.  . Cyanocobalamin (VITAMIN B 12 PO) Take 1 tablet by mouth every morning.   . cyclobenzaprine (FLEXERIL) 5 MG tablet Take 5 mg by mouth at bedtime.   . DULoxetine (CYMBALTA) 30 MG capsule Take 30 mg by mouth every morning.   . gabapentin (NEURONTIN) 400 MG capsule Take 400 mg by mouth 3 (three) times daily.   . hydrochlorothiazide (MICROZIDE) 12.5 MG capsule Take 1 capsule (12.5 mg total) by mouth daily.  . irbesartan (AVAPRO) 150 MG tablet TAKE ONE (1) TABLET BY MOUTH EVERY DAY (Patient taking differently: TAKE ONE (1) TABLET BY MOUTH AS NEEDED FOR BP)  . losartan (COZAAR) 50 MG tablet Take 1 tablet (50 mg total) by mouth daily.  . metFORMIN (GLUCOPHAGE) 500 MG tablet Take 500 mg by mouth daily with breakfast.  . nitroGLYCERIN (NITROSTAT) 0.4 MG SL tablet Place 0.4 mg under the tongue every 5 (five) minutes as needed for chest pain.  . nortriptyline (PAMELOR) 25 MG capsule Take 25 mg by mouth at bedtime.  . pantoprazole (PROTONIX) 20 MG tablet TAKE 1 TABLET BY MOUTH DAILY  . tamsulosin (FLOMAX) 0.4 MG CAPS capsule Take 0.4 mg by mouth daily.  . ticagrelor (BRILINTA) 90 MG TABS tablet Take 1 tablet (90 mg total) by mouth 2 (two) times daily.  . Tiotropium Bromide Monohydrate (SPIRIVA RESPIMAT) 2.5 MCG/ACT AERS Inhale 2 puffs into the lungs daily.  . [DISCONTINUED] potassium chloride SA (K-DUR,KLOR-CON) 20 MEQ tablet Take 1 tablet (20 mEq total) by mouth 2 (two) times daily.   No facility-administered encounter medications on  file as of 02/15/2019.    Allergies as of 02/15/2019  . (No Known Allergies)    Past Medical History:  Diagnosis Date  . CHF (congestive heart failure) ([place])   . Chronic cough   . COPD, severe ([place])    PT DENIES REGULAR DAILY INHALER ANY PRN  . Coronary artery disease   . CVA (cerebral vascular accident) ([place]) 02/22/2011   LEFT BRAIN STEM PONTINE HEMORRHAGE--  RIGHT SIDED WEAKNESS  . Diabetes mellitus without complication ([place])    type 2  .  Dyspnea   . GERD (gastroesophageal reflux disease)   . Harsh voice quality    PT STATES NORMAL FOR HIM  . High cholesterol   . History of CHF (congestive heart failure) MONITORED BY DR BOUSKA   DIASTOLIC  . Hydrocele, left   . Hypertension   . NSTEMI (non-ST elevated myocardial infarction) ([employer]) 12/2015  . Pre-operative clearance    GIVEN BY DR Coletta Memos  . Short of breath on exertion   . Smoker   . Weakness of right side of body    S/P CVA   FEB 2013    Past Surgical History:  Procedure Laterality Date  . ABDOMINAL ANGIOGRAM  09/04/2012   Procedure: ABDOMINAL ANGIOGRAM;  Surgeon: Laverda Page, MD;  Location: [facility] CATH LAB;  Service: Cardiovascular;;  . CARDIAC CATHETERIZATION  05-02-2011  DR Johnsie Cancel   MILD NONOBSTRUCTIVE CAD/ LAD, OM , AND D1/ EF 60^  . CARDIAC CATHETERIZATION  AUG 2000   NON-OBSTRUTIVE CAD/ EF 68%/ 25% PROXIMAL LAD/ 20% MID CIRCUMFLEX  . CARDIAC CATHETERIZATION [name] 12/23/2015   Procedure: Left Heart Cath and Coronary Angiography;  Surgeon: Adrian Prows, MD;  Location: [facility] CV LAB;  Service: Cardiovascular;  Laterality: [name];  . CARDIAC CATHETERIZATION [name] 12/24/2015   Procedure: Coronary Stent Intervention;  Surgeon: Adrian Prows, MD;  Location: [facility] CV LAB;  Service: Cardiovascular;  Laterality: [name];  . CORONARY ANGIOPLASTY    . CORONARY STENT PLACEMENT  12/24/2015    PTCA and stenting of the distal RCA   . HYDROCELE EXCISION Left 04/24/2012   Procedure: HYDROCELECTOMY ADULT;  Surgeon: Fredricka Bonine, MD;  Location: [facility];  Service: Urology;  Laterality: Left;  90 mins req for this case     . [place]  . LEFT AND RIGHT HEART CATHETERIZATION WITH CORONARY ANGIOGRAM [name] 09/04/2012   Procedure: LEFT AND RIGHT HEART CATHETERIZATION WITH CORONARY ANGIOGRAM;  Surgeon: Laverda Page, MD;  Location: [facility] CATH LAB;  Service: Cardiovascular;  Laterality: [name];  . LEFT HEART CATHETERIZATION WITH CORONARY ANGIOGRAM  [name] 05/02/2011   Procedure: LEFT HEART CATHETERIZATION WITH CORONARY ANGIOGRAM;  Surgeon: Josue Hector, MD;  Location: [facility] CATH LAB;  Service: Cardiovascular;  Laterality: [name];  . THORACOTOMY/LOBECTOMY Right 03-06-2008   AND STAPLING OF BLEBS FOR SPONTANOUS PNEUMOTHORAX  . TRANSTHORACIC ECHOCARDIOGRAM  02-17-2011   MODERATE LVH/ LVSF NORMAL/ EF 61-60%/ GRADE I DIASTOLIC DYSFUNCTION    Family History  Problem Relation Age of Onset  . Hypertension Mother   . Stroke Mother   . Lung disease Father        died of "colapsed lung" per patient  . Asthma Father   . Diabetes Sister   . Colon cancer Neg Hx   . Stomach cancer Neg Hx     Social History   Socioeconomic History  . Marital status: Married    Spouse name: Not on file  . Number of children: 4  .  Years of education: Not on file  . Highest education level: Not on file  Occupational History  . Not on file  Tobacco Use  . Smoking status: Former Smoker    Packs/day: 0.00    Years: 53.00    Pack years: 0.00    Types: Cigarettes    Quit date: 2019    Years since quitting: 2.0  . Smokeless tobacco: Never Used  . Tobacco comment: 3-4 cigarettes daily- 08/30/17  Substance and Sexual Activity  . Alcohol use: Yes    Alcohol/week: 0.0 standard drinks    Comment: 02/10/2016 "nothing in the last 10 years"  . Drug use: No  . Sexual activity: Not on file  Other Topics Concern  . Not on file  Social History Narrative  . Not on file   Social Determinants of Health   Financial Resource Strain:   . Difficulty of Paying Living Expenses: Not on file  Food Insecurity:   . Worried About Charity fundraiser in the Last Year: Not on file  . Ran Out of Food in the Last Year: Not on file  Transportation Needs:   . Lack of Transportation (Medical): Not on file  . Lack of Transportation (Non-Medical): Not on file  Physical Activity:   . Days of Exercise per Week: Not on file  . Minutes of Exercise per Session: Not on file  Stress:   .  Feeling of Stress : Not on file  Social Connections:   . Frequency of Communication with Friends and Family: Not on file  . Frequency of Social Gatherings with Friends and Family: Not on file  . Attends Religious Services: Not on file  . Active Member of Clubs or Organizations: Not on file  . Attends Archivist Meetings: Not on file  . Marital Status: Not on file  Intimate Partner Violence:   . Fear of Current or Ex-Partner: Not on file  . Emotionally Abused: Not on file  . Physically Abused: Not on file  . Sexually Abused: Not on file    Review of systems: Review of Systems  Constitutional: Negative for fever and chills.  HENT: Negative.   Eyes: Negative for blurred vision.  Respiratory: as per HPI  Cardiovascular: Negative for chest pain and palpitations.  Gastrointestinal: Negative for vomiting, diarrhea, blood per rectum. Genitourinary: Negative for dysuria, urgency, frequency and hematuria.  Musculoskeletal: Negative for myalgias, back pain and joint pain.  Skin: Negative for itching and rash.  Neurological: Negative for dizziness, tremors, focal weakness, seizures and loss of consciousness.  Endo/Heme/Allergies: Negative for environmental allergies.  Psychiatric/Behavioral: Negative for depression, suicidal ideas and hallucinations.  All other systems reviewed and are negative.  Physical Exam: There were no vitals taken for this visit. Gen:      No acute distress HEENT:  EOMI, sclera anicteric Neck:     No masses; no thyromegaly Lungs:    Clear to auscultation bilaterally; normal respiratory effort CV:         Regular rate and rhythm; no murmurs Abd:      + bowel sounds; soft, non-tender; no palpable masses, no distension Ext:    No edema; adequate peripheral perfusion Skin:      Warm and dry; no rash Neuro: alert and oriented x 3 Psych: normal mood and affect  Data Reviewed: PFTs 11/05/2013 FVC 2.88 [64%], FEV1 1.50 [44%), F/F 52 Moderate-severe  obstruction with marked bronchodilator response Moderate diffusion defect.  CT 02/10/2016-no pulmonary embolism, extensive bilateral emphysematous changes, bullous  disease.  Possible growth of the noted glandular lesion..  I have reviewed the images personally.  Assessment:  Severe emphysematous COPD, asthma Currently on Symbicort.  Will add Spiriva Respimat Reevaluate with CBC differential, IgE, alpha-1 antitrypsin levels and phenotype  Abnormal CT, lingular mass Seen since 2011.  Last CT scan shows possible growth We will get a repeat CT for evaluation  Active smoker Smoking cessation encouraged.  He is interested in quitting.  Will go prescription for Chantix and nicotine patches Reassess in 3 months.  Plan/Recommendations: - Continue Symbicort, add Spiriva - CBC, alpha-1 antitrypsin, IgE - Smoking cessation with Chantix, nicotine patch - Follow-up CT of the chest.  Marshell Garfinkel MD [facility] 02/15/2019, 2:29 PM  CC: [name], MD

## 2019-02-18 ENCOUNTER — Other Ambulatory Visit: Payer: Self-pay

## 2019-02-18 MED ORDER — TICAGRELOR 90 MG PO TABS: 90.0000 mg | ORAL_TABLET | Freq: Two times a day (BID) | ORAL | 1 refills | Status: DC

## 2019-02-21 ENCOUNTER — Telehealth: Payer: Self-pay | Admitting: Pulmonary Disease

## 2019-02-21 ENCOUNTER — Other Ambulatory Visit: Payer: Self-pay | Admitting: *Deleted

## 2019-02-21 DIAGNOSIS — R918 Other nonspecific abnormal finding of lung field: Secondary | ICD-10-CM

## 2019-02-21 NOTE — Telephone Encounter (Signed)
Per Dr. Vaughan Browner a stat referral has been placed to Dr. Sondra Come for radiation oncology.

## 2019-02-21 NOTE — Telephone Encounter (Signed)
Patient discussed at tumor board with recommendation to proceed with radiation therapy without tissue biopsy I will make a referral to Dr. Sondra Come for evaluation and management  I called patient today and discussed the recommendations and next steps.  Marshell Garfinkel MD [facility] Please see [email or web address] for pager details.  02/21/2019, 4:49 PM

## 2019-02-21 NOTE — Progress Notes (Signed)
The proposed treatment discussed in cancer conference 02/21/19 is for discussion purpose only and is not a binding recommendation.  The patient was not physically examined nor present for their treatment options.  Therefore, final treatment plans cannot be decided.   Dr. Vaughan Browner updated on recommendations.

## 2019-02-27 ENCOUNTER — Other Ambulatory Visit: Payer: Self-pay

## 2019-02-27 ENCOUNTER — Ambulatory Visit
Admission: RE | Admit: 2019-02-27 | Discharge: 2019-02-27 | Disposition: A | Discharge status: Home / Self Care | Payer: Medicare Other | Source: Ambulatory Visit | Attending: Radiation Oncology | Admitting: Radiation Oncology

## 2019-02-27 ENCOUNTER — Encounter: Payer: Self-pay | Admitting: Radiation Oncology

## 2019-02-27 DIAGNOSIS — I69359 Hemiplegia and hemiparesis following cerebral infarction affecting unspecified side: Secondary | ICD-10-CM | POA: Insufficient documentation

## 2019-02-27 DIAGNOSIS — R911 Solitary pulmonary nodule: Secondary | ICD-10-CM | POA: Insufficient documentation

## 2019-02-27 DIAGNOSIS — C3431 Malignant neoplasm of lower lobe, right bronchus or lung: Secondary | ICD-10-CM

## 2019-02-27 NOTE — Progress Notes (Signed)
Thoracic Location of Tumor / Histology: Per PET scan 02/07/19: 1. Pleural-based 1.5 by 2.0 cm right lower lobe nodule has a maximum SUV of 8.4, suspicious for malignancy  Patient presented with symptoms of: Abnormal CT, lingular mass Seen since 2011.  Last CT scan shows possible growth  Biopsies revealed: area has not been biopsied  Tobacco/Marijuana/Snuff/ETOH use:  Tobacco Use  . Smoking status: Former Smoker    Packs/day: 0.00    Years: 53.00    Pack years: 0.00    Types: Cigarettes    Quit date: 2019    Years since quitting: 2.0  . Smokeless tobacco: Never Used  . Tobacco comment: 3-4 cigarettes daily- 08/30/17  Substance and Sexual Activity  . Alcohol use: Yes    Alcohol/week: 0.0 standard drinks    Comment: 02/10/2016 "nothing in the last 10 years"  . Drug use: No     Past/Anticipated interventions by cardiothoracic surgery, if any: None at this time.  Past/Anticipated interventions by medical oncology, if any: None at this time.   Signs/Symptoms  Weight changes, if any: Pt denies any significant, unexplained weight changes  Respiratory complaints, if any: Pt reports occasional cough with white phlegm  Hemoptysis, if any: Pt denies hemoptysis  Pain issues, if any:  Pt denies c/o pain  Pt reports SOB with exertion.  SAFETY ISSUES:  Prior radiation? no  Pacemaker/ICD? no   Possible current pregnancy?[name]  Is the patient on methotrexate? no  Current Complaints / other details:  Pt presents today for initial consult with Dr. Sondra Come for Radiation Oncology. Pt is accompanied by wife.   Per Dr. Vaughan Browner: HPI: 69 year old active smoker with severe COPD, asthma, spontaneous pneumothorax, coronary artery disease, CVA Previously followed by Dr. Melvyn Novas, maintained on the Symbicort. Complains of stable dyspnea on exertion, cough with mucus production.  No fevers, chills  History noted for right spontaneous pneumothorax in 2010 in [place] where he  underwent right upper lobectomy for bullous disease.  Also follows with Dr. [name] for coronary artery disease  Pets: Has outdoor cat, rooster, dog.  No indoor pets. 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  Continues to smoke occasionally Travel history: Originally from [place].  Lived in [place], middle [place] to [place] 30 years ago Relevant family history: No significant family history of lung issues.   BP (!) 155/73 (BP Location: Right Arm, Patient Position: Sitting)   Pulse (!) 59   Temp 98.2 F (36.8 C) (Temporal)   Resp 18   Ht 6' (1.829 m)   Wt 213 lb (96.6 kg)   SpO2 97%   BMI 28.89 kg/m   Loma Sousa, RN BSN

## 2019-02-27 NOTE — Patient Instructions (Signed)
Coronavirus (COVID-19) Are you at risk?  Are you at risk for the Coronavirus (COVID-19)?  To be considered HIGH RISK for Coronavirus (COVID-19), you have to meet the following criteria:  . Traveled to [place], [place], [place], [place] or [place]; or in the [place] to [place], [place], [place], or [place]; and have fever, cough, and shortness of breath within the last 2 weeks of travel OR . Been in close contact with a person diagnosed with COVID-19 within the last 2 weeks and have fever, cough, and shortness of breath . IF YOU DO NOT MEET THESE CRITERIA, YOU ARE CONSIDERED LOW RISK FOR COVID-19.  What to do if you are HIGH RISK for COVID-19?  . If you are having a medical emergency, call 911. . Seek medical care right away. Before you go to a doctor's office, urgent care or emergency department, call ahead and tell them about your recent travel, contact with someone diagnosed with COVID-19, and your symptoms. You should receive instructions from your physician's office regarding next steps of care.  . When you arrive at healthcare provider, tell the healthcare staff immediately you have returned from visiting [place], [place], [place], [place] or [place]; or traveled in the [place] to [place], [place], [place], or [place]; in the last two weeks or you have been in close contact with a person diagnosed with COVID-19 in the last 2 weeks.   . Tell the health care staff about your symptoms: fever, cough and shortness of breath. . After you have been seen by a medical provider, you will be either: o Tested for (COVID-19) and discharged home on quarantine except to seek medical care if symptoms worsen, and asked to  - Stay home and avoid contact with others until you get your results (4-5 days)  - Avoid travel on public transportation if possible (such as bus, train, or airplane) or o Sent to the Emergency Department by EMS for evaluation, COVID-19 testing, and possible  admission depending on your condition and test results.  What to do if you are LOW RISK for COVID-19?  Reduce your risk of any infection by using the same precautions used for avoiding the common cold or flu:  . Wash your hands often with soap and warm water for at least 20 seconds.  If soap and water are not readily available, use an alcohol-based hand sanitizer with at least 60% alcohol.  . If coughing or sneezing, cover your mouth and nose by coughing or sneezing into the elbow areas of your shirt or coat, into a tissue or into your sleeve (not your hands). . Avoid shaking hands with others and consider head nods or verbal greetings only. . Avoid touching your eyes, nose, or mouth with unwashed hands.  . Avoid close contact with people who are sick. . Avoid places or events with large numbers of people in one location, like concerts or sporting events. . Carefully consider travel plans you have or are making. . If you are planning any travel outside or inside the [place], visit the CDC's Travelers' Health webpage for the latest health notices. . If you have some symptoms but not all symptoms, continue to monitor at home and seek medical attention if your symptoms worsen. . If you are having a medical emergency, call 911.   ADDITIONAL HEALTHCARE OPTIONS FOR PATIENTS  [facility] / e-Visit: [email or web address]         [facility]: [phone]  [facility]: [phone]                   [facility]: [phone]   

## 2019-02-27 NOTE — Progress Notes (Signed)
Radiation Oncology         (336) [phone] ________________________________  Initial Outpatient Consultation  Name: JEANETTE MOFFATT MRN: [id number]  Date: 02/27/2019  DOB: [birth year]  CC:[name], MD  Marshell Garfinkel, MD   REFERRING PHYSICIAN: Marshell Garfinkel, MD  DIAGNOSIS: The encounter diagnosis was Solitary pulmonary nodule.  Presumptive right lung cancer, clinical stage I  HISTORY OF PRESENT ILLNESS::Treson A Skufca is a 69 y.o. male who is accompanied by wife. The patient presented to ED on 09/24/2009 with complaints of chest pain and epistaxis. CTA of chest at that time showed new small nodular opacities with the largest in the right lower lobe. Since then, the patient has had several CT scans of his chest as follow up. The most recent CT of chest was on 01/24/2019 and showed a new, lobulated subpleural nodule of the medial right lower lobe overlying the right aspect of the T8 vertebral body and measuring 1.7 x 1.6 cm that was moderately concerning for malignancy. Previously described irregular subpleural opacity of the right lower lobe had resolved. Finally, there was a stable dense paramedian nodule or consolidation of the lingula.  PET scan on 02/07/2019 showed pleural-based 1.5 by 2.0 cm right lower lobe nodule with a maximum SUV of 8.4 that was suspicious for malignancy. Other findings included intracranial chronic microvascular white matter disease and suspected remote encephalomalacia inferiorly in the left frontal lobe of the brain.   The patient was previously followed by Dr. Melvyn Novas and is now followed by Dr. Vaughan Browner. The patient was discussed at tumor board on 02/21/2019, during which time it was recommended that the patient proceed with radiation therapy without tissue biopsy.  Given the CT findings of the patient's lung it was felt to be too risky to consider CT-guided biopsy.  Bronch navigation and biopsy was also felt to be too risky for this patient.  Of note, the  patient had a spontaneous right pneumothorax in 2010 in [place], [place] where he underwent a right upper lobectomy for bullous disease.  PREVIOUS RADIATION THERAPY: No  PAST MEDICAL HISTORY:  Past Medical History:  Diagnosis Date   CHF (congestive heart failure) (HCC)    Chronic cough    COPD, severe (HCC)    PT DENIES REGULAR DAILY INHALER ANY PRN   Coronary artery disease    CVA (cerebral vascular accident) ([place]) 02/22/2011   LEFT BRAIN STEM PONTINE HEMORRHAGE--  RIGHT SIDED WEAKNESS   Diabetes mellitus without complication (HCC)    type 2   Dyspnea    GERD (gastroesophageal reflux disease)    Harsh voice quality    PT STATES NORMAL FOR HIM   High cholesterol    History of CHF (congestive heart failure) MONITORED BY DR Coletta Memos   DIASTOLIC   Hydrocele, left    Hypertension    NSTEMI (non-ST elevated myocardial infarction) ([place]) 12/2015   Pre-operative clearance    GIVEN BY DR Coletta Memos   Short of breath on exertion    Smoker    Weakness of right side of body    S/P CVA   FEB 2013    PAST SURGICAL HISTORY: Past Surgical History:  Procedure Laterality Date   ABDOMINAL ANGIOGRAM  09/04/2012   Procedure: ABDOMINAL ANGIOGRAM;  Surgeon: Laverda Page, MD;  Location: [facility] CATH LAB;  Service: Cardiovascular;;   CARDIAC CATHETERIZATION  05-02-2011  DR Johnsie Cancel   MILD NONOBSTRUCTIVE CAD/ LAD, OM , AND D1/ EF 60^   CARDIAC CATHETERIZATION  AUG 2000   NON-OBSTRUTIVE CAD/ EF  68%/ 25% PROXIMAL LAD/ 20% MID CIRCUMFLEX   CARDIAC CATHETERIZATION [name] 12/23/2015   Procedure: Left Heart Cath and Coronary Angiography;  Surgeon: Adrian Prows, MD;  Location: [facility] CV LAB;  Service: Cardiovascular;  Laterality: [name];   CARDIAC CATHETERIZATION [name] 12/24/2015   Procedure: Coronary Stent Intervention;  Surgeon: Adrian Prows, MD;  Location: [place] CV LAB;  Service: Cardiovascular;  Laterality: [name];   CORONARY ANGIOPLASTY     CORONARY STENT PLACEMENT  12/24/2015    PTCA and  stenting of the distal RCA    HYDROCELE EXCISION Left 04/24/2012   Procedure: HYDROCELECTOMY ADULT;  Surgeon: Fredricka Bonine, MD;  Location: [facility];  Service: Urology;  Laterality: Left;  90 mins req for this case      [place]   LEFT AND RIGHT HEART CATHETERIZATION WITH CORONARY ANGIOGRAM [name] 09/04/2012   Procedure: LEFT AND RIGHT HEART CATHETERIZATION WITH CORONARY ANGIOGRAM;  Surgeon: Laverda Page, MD;  Location: [facility] CATH LAB;  Service: Cardiovascular;  Laterality: [name];   LEFT HEART CATHETERIZATION WITH CORONARY ANGIOGRAM [name] 05/02/2011   Procedure: LEFT HEART CATHETERIZATION WITH CORONARY ANGIOGRAM;  Surgeon: Josue Hector, MD;  Location: [facility] CATH LAB;  Service: Cardiovascular;  Laterality: [name];   THORACOTOMY/LOBECTOMY Right 03-06-2008   AND STAPLING OF BLEBS FOR SPONTANOUS PNEUMOTHORAX   TRANSTHORACIC ECHOCARDIOGRAM  02-17-2011   MODERATE LVH/ LVSF NORMAL/ EF 16-10%/ GRADE I DIASTOLIC DYSFUNCTION    FAMILY HISTORY:  Family History  Problem Relation Age of Onset   Hypertension Mother    Stroke Mother    Lung disease Father        died of "colapsed lung" per patient   Asthma Father    Diabetes Sister    Colon cancer Neg Hx    Stomach cancer Neg Hx     SOCIAL HISTORY:  Social History   Tobacco Use   Smoking status: Former Smoker    Packs/day: 0.00    Years: 53.00    Pack years: 0.00    Types: Cigarettes    Quit date: 2019    Years since quitting: 2.1   Smokeless tobacco: Never Used   Tobacco comment: 3-4 cigarettes daily- 08/30/17  Substance Use Topics   Alcohol use: Yes    Alcohol/week: 0.0 standard drinks    Comment: 02/10/2016 "nothing in the last 10 years"   Drug use: No    ALLERGIES: No Known Allergies  MEDICATIONS:  Current Outpatient Medications  Medication Sig Dispense Refill   albuterol (PROVENTIL) (2.5 MG/3ML) 0.083% nebulizer solution Take 2.5 mg by nebulization every 4 (four) hours  as needed for wheezing or shortness of breath.     Albuterol Sulfate (PROAIR RESPICLICK) 960 (90 BASE) MCG/ACT AEPB Inhale 2 puffs into the lungs 4 (four) times daily as needed. 1 each 5   amLODipine (NORVASC) 10 MG tablet Take 10 mg by mouth every morning.      aspirin EC 325 MG tablet Take 650 mg by mouth every morning.      bisoprolol (ZEBETA) 5 MG tablet Take 1 tablet (5 mg total) by mouth daily. 30 tablet 0   budesonide-formoterol (SYMBICORT) 160-4.5 MCG/ACT inhaler Inhale 2 puffs into the lungs 2 (two) times daily.     cloNIDine (CATAPRES) 0.3 MG tablet Take 0.3 mg by mouth 2 (two) times daily.     Cyanocobalamin (VITAMIN B 12 PO) Take 1 tablet by mouth every morning.      cyclobenzaprine (FLEXERIL) 5 MG tablet Take 5  mg by mouth at bedtime.      dabigatran (PRADAXA) 150 MG CAPS capsule Take 150 mg by mouth 2 (two) times daily.     DULoxetine (CYMBALTA) 30 MG capsule Take 30 mg by mouth every morning.      gabapentin (NEURONTIN) 400 MG capsule Take 400 mg by mouth 3 (three) times daily.      irbesartan (AVAPRO) 150 MG tablet TAKE ONE (1) TABLET BY MOUTH EVERY DAY (Patient taking differently: TAKE ONE (1) TABLET BY MOUTH AS NEEDED FOR BP) 30 tablet 0   losartan (COZAAR) 50 MG tablet Take 1 tablet (50 mg total) by mouth daily. 90 tablet 3   metFORMIN (GLUCOPHAGE) 500 MG tablet Take 500 mg by mouth daily with breakfast.     nitroGLYCERIN (NITROSTAT) 0.4 MG SL tablet Place 0.4 mg under the tongue every 5 (five) minutes as needed for chest pain.     nortriptyline (PAMELOR) 25 MG capsule Take 25 mg by mouth at bedtime.     pantoprazole (PROTONIX) 20 MG tablet TAKE 1 TABLET BY MOUTH DAILY 90 tablet 3   tamsulosin (FLOMAX) 0.4 MG CAPS capsule Take 0.4 mg by mouth daily.     ticagrelor (BRILINTA) 90 MG TABS tablet Take 1 tablet (90 mg total) by mouth 2 (two) times daily. 90 tablet 1   Tiotropium Bromide Monohydrate (SPIRIVA RESPIMAT) 2.5 MCG/ACT AERS Inhale 2 puffs into the lungs  daily. 4 g 5   hydrochlorothiazide (MICROZIDE) 12.5 MG capsule Take 1 capsule (12.5 mg total) by mouth daily. 90 capsule 3   No current facility-administered medications for this encounter.    REVIEW OF SYSTEMS:  A 10+ POINT REVIEW OF SYSTEMS WAS OBTAINED including neurology, dermatology, psychiatry, cardiac, respiratory, lymph, extremities, GI, GU, musculoskeletal, constitutional, reproductive, HEENT.  Reports no significant cough or hemoptysis.  He has dyspnea when ambulating a flight of stairs.  He does not have any supplemental oxygen at home.  No reports of headaches or new bony pain.  Reports mild right-sided weakness from his CVA and walks with a cane.   PHYSICAL EXAM:  height is 6' (1.829 m) and weight is 213 lb (96.6 kg). His temporal temperature is 98.2 F (36.8 C). His blood pressure is 155/73 (abnormal) and his pulse is 59 (abnormal). His respiration is 18 and oxygen saturation is 97%.   General: Alert and oriented, in no acute distress HEENT: Head is normocephalic. Extraocular movements are intact. Oropharynx is clear. Neck: Neck is supple, no palpable cervical or supraclavicular lymphadenopathy. Heart: Regular in rate and rhythm with no murmurs, rubs, or gallops. Chest: Clear to auscultation bilaterally, with no rhonchi, wheezes, or rales. Abdomen: Soft, nontender, nondistended, with no rigidity or guarding. Extremities: No cyanosis or edema. Lymphatics: see Neck Exam Skin: No concerning lesions. Musculoskeletal: symmetric strength and muscle tone throughout. Neurologic: Cranial nerves II through XII are grossly intact. No obvious focalities. Speech is fluent. Coordination is intact. Psychiatric: Judgment and insight are intact. Affect is appropriate.   ECOG = 1  0 - Asymptomatic (Fully active, able to carry on all predisease activities without restriction)  1 - Symptomatic but completely ambulatory (Restricted in physically strenuous activity but ambulatory and able to  carry out work of a light or sedentary nature. For example, light housework, office work)  2 - Symptomatic, <50% in bed during the day (Ambulatory and capable of all self care but unable to carry out any work activities. Up and about more than 50% of waking hours)  3 - Symptomatic, >  50% in bed, but not bedbound (Capable of only limited self-care, confined to bed or chair 50% or more of waking hours)  4 - Bedbound (Completely disabled. Cannot carry on any self-care. Totally confined to bed or chair)  5 - Death   Eustace Pen MM, Creech RH, Tormey DC, et al. [phone]). "Toxicity and response criteria of the [facility]". [place] Oncol. 5 (6): 649-55  LABORATORY DATA:  Lab Results  Component Value Date   WBC 9.4 08/30/2017   HGB 13.9 08/30/2017   HCT 42.4 08/30/2017   MCV 79.6 08/30/2017   PLT 279.0 08/30/2017   NEUTROABS 6.0 08/30/2017   Lab Results  Component Value Date   NA 140 06/02/2017   K 3.3 (L) 06/02/2017   CL 103 06/02/2017   CO2 27 06/02/2017   GLUCOSE 164 (H) 06/02/2017   CREATININE 1.36 (H) 06/02/2017   CALCIUM 9.0 06/02/2017      RADIOGRAPHY: NM PET Image Restag (PS) Skull Base To Thigh  Result Date: 02/07/2019 CLINICAL DATA:  Subsequent treatment strategy for pulmonary nodule. EXAM: NUCLEAR MEDICINE PET SKULL BASE TO THIGH TECHNIQUE: 11.0 mCi F-18 FDG was injected intravenously. Full-ring PET imaging was performed from the skull base to thigh after the radiotracer. CT data was obtained and used for attenuation correction and anatomic localization. Fasting blood glucose: 134 mg/dl COMPARISON:  Multiple exams, including 09/29/2017 and chest CT from 01/24/2019 FINDINGS: Mediastinal blood pool activity: SUV max 2.6 Liver activity: SUV max NA NECK: Low-grade dental activity likely related to tooth decay. Incidental CT findings: Hypodensity inferiorly in the left frontal lobe on image 2/4 is stable and probably represents old encephalomalacia. There is  evidence of chronic microvascular white matter disease. CHEST: The pleural-based right lower lobe lesion currently measures 1.5 by 2.0 cm by my measurements on image 39/8, has a maximum SUV of 8.4, suspicious for malignancy or active granulomatous process. A right paratracheal lymph node measuring 0.8 cm in short axis on image 64/4 has a maximum SUV of 1.9, below blood pool. Pleural-based nodularity in the lingula along the left cardiac border has maximum SUV of 2.1, below blood pool. Appearance favors rounded atelectasis. Incidental CT findings: Severe emphysema. Coronary, aortic arch, and branch vessel atherosclerotic vascular disease. Herniation of the right posterior hemidiaphragm containing adipose tissue. Right sixth rib osteotomy posteriorly. Prior right upper lobectomy. ABDOMEN/PELVIS: Photopenic fluid density lesion of the left kidney upper pole compatible with cyst. Incidental CT findings: Cholelithiasis. Aortoiliac atherosclerotic vascular disease. Atrophic right psoas muscle. Trace free fluid in the pelvis, etiology uncertain. SKELETON: No significant abnormal hypermetabolic activity in this region. Incidental CT findings: Right sixth rib osteotomy posteriorly. IMPRESSION: 1. Pleural-based 1.5 by 2.0 cm right lower lobe nodule has a maximum SUV of 8.4, suspicious for malignancy. Active granulomatous process would be a less likely differential diagnostic consideration. Tissue diagnosis is likely warranted. 2. Other imaging findings of potential clinical significance: Intracranial chronic microvascular white matter disease. Suspected remote encephalomalacia inferiorly in the left frontal lobe of the brain. Severe emphysema. Rounded atelectasis along the lingula. Prior right upper lobectomy. Aortic Atherosclerosis (ICD10-I70.0). Coronary atherosclerosis. Cholelithiasis. Trace free pelvic fluid, etiology uncertain. Electronically Signed   By: Van Clines [name]   On: 02/07/2019 11:36      IMPRESSION:  Presumptive right lung cancer  The patient presents with a pleural-based 1.5 x 2.0 cm right lower lobe nodule with significant activity on PET scan.  No other areas noted on the patient's PET scan.  As above the  patient is not a candidate for surgical removal or biopsy.  The patient's case was presented at the multidisciplinary thoracic oncology conference last week and recommendations were to proceed with radiation therapy without tissue diagnosis.  He will be an excellent candidate for stereotactic body radiation therapy directed at the solitary lesion.  I discussed the overall course of treatment side effects and potential toxicities of focused ablative radiation therapy with the patient and his wife.  He appears to understand and wishes to proceed with planned course of treatment.    PLAN: Patient will return for SBRT simulation next week with treatments beginning a week later.  Anticipate 3 SBRT sessions directed at the solitary lesion in the right lower lobe.    ------------------------------------------------  Blair Promise, PhD, MD  This document serves as a record of services personally performed by Gery Pray, MD. It was created on his behalf by Clerance Lav, a trained medical scribe. The creation of this record is based on the scribe's personal observations and the provider's statements to them. This document has been checked and approved by the attending provider.

## 2019-03-06 ENCOUNTER — Ambulatory Visit
Admission: RE | Admit: 2019-03-06 | Discharge: 2019-03-06 | Disposition: A | Discharge status: Home / Self Care | Payer: Medicare Other | Source: Ambulatory Visit | Attending: Radiation Oncology | Admitting: Radiation Oncology

## 2019-03-06 ENCOUNTER — Other Ambulatory Visit: Payer: Self-pay

## 2019-03-06 DIAGNOSIS — Z510 Encounter for antineoplastic radiation therapy: Secondary | ICD-10-CM | POA: Diagnosis not present

## 2019-03-06 DIAGNOSIS — R918 Other nonspecific abnormal finding of lung field: Secondary | ICD-10-CM | POA: Diagnosis present

## 2019-03-06 DIAGNOSIS — R911 Solitary pulmonary nodule: Secondary | ICD-10-CM

## 2019-03-06 NOTE — Progress Notes (Signed)
  Radiation Oncology         (336) [phone] ________________________________  Name: Jonathon [name] MRN: [id number]  Date: 03/06/2019  DOB: 1950-05-14   STEREOTACTIC BODY RADIOTHERAPY SIMULATION AND TREATMENT PLANNING NOTE    DIAGNOSIS: Presumptive right lung cancer, clinical stage I  NARRATIVE:  The patient was brought to the [place] suite.  Identity was confirmed.  All relevant records and images related to the planned course of therapy were reviewed.  The patient freely provided informed written consent to proceed with treatment after reviewing the details related to the planned course of therapy. The consent form was witnessed and verified by the simulation staff.  Then, the patient was set-up in a stable reproducible  supine position for radiation therapy.  A BodyFix immobilization pillow was fabricated for reproducible positioning.  Then I personally applied the abdominal compression paddle to limit respiratory excursion.  4D respiratoy motion management CT images were obtained.  Surface markings were placed.  The CT images were loaded into the planning software.  Then, using Cine, MIP, and standard views, the internal target volume (ITV) and planning target volumes (PTV) were delinieated, and avoidance structures were contoured.  Treatment planning then occurred.  The radiation prescription was entered and confirmed.  A total of two complex treatment devices were fabricated in the form of the BodyFix immobilization pillow and a neck accuform cushion.  I have requested : 3D Simulation  I have requested a DVH of the following structures: Heart, Lungs, Esophagus, Chest Wall, Brachial Plexus, Major Blood Vessels, and targets.  PLAN:  The patient will receive 54 Gy in 3 fractions.  -----------------------------------  Blair Promise, PhD, MD  This document serves as a record of services personally performed by Gery Pray, MD. It was created on his behalf by Clerance Lav, a  trained medical scribe. The creation of this record is based on the scribe's personal observations and the provider's statements to them. This document has been checked and approved by the attending provider.

## 2019-03-10 NOTE — Progress Notes (Signed)
  Radiation Oncology         [phone]) [phone] ________________________________  Name: Jonathon [name] MRN: [id number]  Date: 03/13/2019  DOB: 1950/03/28  Stereotactic Body Radiotherapy Treatment Procedure Note  NARRATIVE:  Jonathon [name] was brought to the stereotactic radiation treatment machine and placed supine on the CT couch. The patient was set up for stereotactic body radiotherapy on the body fix pillow.  3D TREATMENT PLANNING AND DOSIMETRY:  The patient's radiation plan was reviewed and approved prior to starting treatment.  It showed 3-dimensional radiation distributions overlaid onto the planning CT.  The [facility] for the target structures as well as the organs at risk were reviewed. The documentation of this is filed in the radiation oncology EMR.  SIMULATION VERIFICATION:  The patient underwent CT imaging on the treatment unit.  These were carefully aligned to document that the ablative radiation dose would cover the target volume and maximally spare the nearby organs at risk according to the planned distribution.  SPECIAL TREATMENT PROCEDURE: Jonathon [name] received high dose ablative stereotactic body radiotherapy to the planned target volume without unforeseen complications. Treatment was delivered uneventfully. The high doses associated with stereotactic body radiotherapy and the significant potential risks require careful treatment set up and patient monitoring constituting a special treatment procedure   STEREOTACTIC TREATMENT MANAGEMENT:  Following delivery, the patient was evaluated clinically. The patient tolerated treatment without significant acute effects, and was discharged to home in stable condition.    PLAN: Continue treatment as planned.  ________________________________  Blair Promise, PhD, MD  This document serves as a record of services personally performed by Gery Pray, MD. It was created on his behalf by Clerance Lav, a trained medical scribe. The  creation of this record is based on the scribe's personal observations and the provider's statements to them. This document has been checked and approved by the attending provider.

## 2019-03-12 DIAGNOSIS — R918 Other nonspecific abnormal finding of lung field: Secondary | ICD-10-CM | POA: Diagnosis not present

## 2019-03-13 ENCOUNTER — Ambulatory Visit
Admission: RE | Admit: 2019-03-13 | Discharge: 2019-03-13 | Disposition: A | Discharge status: Home / Self Care | Payer: Medicare Other | Source: Ambulatory Visit | Attending: Radiation Oncology | Admitting: Radiation Oncology

## 2019-03-13 ENCOUNTER — Other Ambulatory Visit: Payer: Self-pay

## 2019-03-13 DIAGNOSIS — R918 Other nonspecific abnormal finding of lung field: Secondary | ICD-10-CM | POA: Diagnosis not present

## 2019-03-13 DIAGNOSIS — R911 Solitary pulmonary nodule: Secondary | ICD-10-CM

## 2019-03-17 NOTE — Progress Notes (Signed)
  Radiation Oncology         [phone]) [phone] ________________________________  Name: Jonathon [name] MRN: [id number]  Date: 03/18/2019  DOB: March 20, 1950  Stereotactic Body Radiotherapy Treatment Procedure Note  NARRATIVE:  Jonathon [name] was brought to the stereotactic radiation treatment machine and placed supine on the CT couch. The patient was set up for stereotactic body radiotherapy on the body fix pillow.  3D TREATMENT PLANNING AND DOSIMETRY:  The patient's radiation plan was reviewed and approved prior to starting treatment.  It showed 3-dimensional radiation distributions overlaid onto the planning CT.  The [facility] for the target structures as well as the organs at risk were reviewed. The documentation of this is filed in the radiation oncology EMR.  SIMULATION VERIFICATION:  The patient underwent CT imaging on the treatment unit.  These were carefully aligned to document that the ablative radiation dose would cover the target volume and maximally spare the nearby organs at risk according to the planned distribution.  SPECIAL TREATMENT PROCEDURE: Jonathon [name] received high dose ablative stereotactic body radiotherapy to the planned target volume without unforeseen complications. Treatment was delivered uneventfully. The high doses associated with stereotactic body radiotherapy and the significant potential risks require careful treatment set up and patient monitoring constituting a special treatment procedure   STEREOTACTIC TREATMENT MANAGEMENT:  Following delivery, the patient was evaluated clinically. The patient tolerated treatment without significant acute effects, and was discharged to home in stable condition.    PLAN: Continue treatment as planned.  ________________________________  Blair Promise, PhD, MD  This document serves as a record of services personally performed by Gery Pray, MD. It was created on his behalf by Clerance Lav, a trained medical scribe. The  creation of this record is based on the scribe's personal observations and the provider's statements to them. This document has been checked and approved by the attending provider.

## 2019-03-18 ENCOUNTER — Other Ambulatory Visit: Payer: Self-pay

## 2019-03-18 ENCOUNTER — Ambulatory Visit
Admission: RE | Admit: 2019-03-18 | Discharge: 2019-03-18 | Disposition: A | Discharge status: Home / Self Care | Payer: Medicare Other | Source: Ambulatory Visit | Attending: Radiation Oncology | Admitting: Radiation Oncology

## 2019-03-18 DIAGNOSIS — R918 Other nonspecific abnormal finding of lung field: Secondary | ICD-10-CM | POA: Diagnosis present

## 2019-03-18 DIAGNOSIS — Z510 Encounter for antineoplastic radiation therapy: Secondary | ICD-10-CM | POA: Diagnosis not present

## 2019-03-18 DIAGNOSIS — R911 Solitary pulmonary nodule: Secondary | ICD-10-CM

## 2019-03-18 NOTE — Progress Notes (Signed)
  Radiation Oncology         (336) [phone] ________________________________  Name: Jonathon [name] MRN: [id number]  Date: 03/20/2019  DOB: [birth year]  Stereotactic Body Radiotherapy Treatment Procedure Note  NARRATIVE:  Jonathon [name] was brought to the stereotactic radiation treatment machine and placed supine on the CT couch. The patient was set up for stereotactic body radiotherapy on the body fix pillow.  3D TREATMENT PLANNING AND DOSIMETRY:  The patient's radiation plan was reviewed and approved prior to starting treatment.  It showed 3-dimensional radiation distributions overlaid onto the planning CT.  The [facility] for the target structures as well as the organs at risk were reviewed. The documentation of this is filed in the radiation oncology EMR.  SIMULATION VERIFICATION:  The patient underwent CT imaging on the treatment unit.  These were carefully aligned to document that the ablative radiation dose would cover the target volume and maximally spare the nearby organs at risk according to the planned distribution.  SPECIAL TREATMENT PROCEDURE: Jonathon [name] received high dose ablative stereotactic body radiotherapy to the planned target volume without unforeseen complications. Treatment was delivered uneventfully. The high doses associated with stereotactic body radiotherapy and the significant potential risks require careful treatment set up and patient monitoring constituting a special treatment procedure   STEREOTACTIC TREATMENT MANAGEMENT:  Following delivery, the patient was evaluated clinically. The patient tolerated treatment without significant acute effects, and was discharged to home in stable condition.    PLAN: Continue treatment as planned.  ________________________________  Blair Promise, PhD, MD  This document serves as a record of services personally performed by Gery Pray, MD. It was created on his behalf by Clerance Lav, a trained medical scribe. The  creation of this record is based on the scribe's personal observations and the provider's statements to them. This document has been checked and approved by the attending provider.

## 2019-03-20 ENCOUNTER — Encounter: Payer: Self-pay | Admitting: Radiation Oncology

## 2019-03-20 ENCOUNTER — Other Ambulatory Visit: Payer: Self-pay

## 2019-03-20 ENCOUNTER — Ambulatory Visit
Admission: RE | Admit: 2019-03-20 | Discharge: 2019-03-20 | Disposition: A | Discharge status: Home / Self Care | Payer: Medicare Other | Source: Ambulatory Visit | Attending: Radiation Oncology | Admitting: Radiation Oncology

## 2019-03-20 DIAGNOSIS — R918 Other nonspecific abnormal finding of lung field: Secondary | ICD-10-CM | POA: Diagnosis not present

## 2019-03-20 DIAGNOSIS — R911 Solitary pulmonary nodule: Secondary | ICD-10-CM

## 2019-03-26 ENCOUNTER — Other Ambulatory Visit: Payer: Self-pay | Admitting: Cardiology

## 2019-04-03 NOTE — Progress Notes (Incomplete)
  Patient Name: Jonathon [name] MRN: [id number] DOB: 05-03-1950 Referring Physician: Marshell Garfinkel (Profile Not Attached) Date of Service: 03/20/2019 [facility], [place]                                                        End Of Treatment Note  Diagnoses: R91.1-Solitary pulmonary nodule  Cancer Staging: Presumptive right lung cancer,clinical stage I  Intent: Curative  Radiation Treatment Dates: 03/13/2019 through 03/20/2019 Site Technique Total Dose (Gy) Dose per Fx (Gy) Completed Fx Beam Energies  Lung, Right: Lung_Rt IMRT 54/54 18 3/3 6XFFF   Narrative: The patient tolerated radiation therapy relatively well. He did not report any radiation-related side effects. [facility] Beam images were reviewed and showed that the tumor had already shrunk after two treatments.  Plan: The patient will follow-up with radiation oncology in one month.  ________________________________________________   Blair Promise, PhD, MD  This document serves as a record of services personally performed by Gery Pray, MD. It was created on his behalf by Clerance Lav, a trained medical scribe. The creation of this record is based on the scribe's personal observations and the provider's statements to them. This document has been checked and approved by the attending provider.

## 2019-04-29 ENCOUNTER — Other Ambulatory Visit (HOSPITAL_COMMUNITY): Payer: Self-pay | Admitting: Family Medicine

## 2019-04-29 ENCOUNTER — Other Ambulatory Visit: Payer: Self-pay

## 2019-04-29 ENCOUNTER — Encounter: Payer: Self-pay | Admitting: Radiation Oncology

## 2019-04-29 ENCOUNTER — Ambulatory Visit
Admission: RE | Admit: 2019-04-29 | Discharge: 2019-04-29 | Disposition: A | Discharge status: Home / Self Care | Payer: Medicare Other | Source: Ambulatory Visit | Attending: Radiation Oncology | Admitting: Radiation Oncology

## 2019-04-29 VITALS — BP 141/64 | HR 62 | Temp 99.1°F | Resp 20 | Ht 73.0 in | Wt 208.8 lb

## 2019-04-29 DIAGNOSIS — Z923 Personal history of irradiation: Secondary | ICD-10-CM | POA: Insufficient documentation

## 2019-04-29 DIAGNOSIS — R5383 Other fatigue: Secondary | ICD-10-CM | POA: Diagnosis not present

## 2019-04-29 DIAGNOSIS — R05 Cough: Secondary | ICD-10-CM | POA: Diagnosis not present

## 2019-04-29 DIAGNOSIS — K5900 Constipation, unspecified: Secondary | ICD-10-CM | POA: Insufficient documentation

## 2019-04-29 DIAGNOSIS — Z7984 Long term (current) use of oral hypoglycemic drugs: Secondary | ICD-10-CM | POA: Diagnosis not present

## 2019-04-29 DIAGNOSIS — R0602 Shortness of breath: Secondary | ICD-10-CM | POA: Diagnosis not present

## 2019-04-29 DIAGNOSIS — E7800 Pure hypercholesterolemia, unspecified: Secondary | ICD-10-CM | POA: Insufficient documentation

## 2019-04-29 DIAGNOSIS — Z79899 Other long term (current) drug therapy: Secondary | ICD-10-CM | POA: Insufficient documentation

## 2019-04-29 DIAGNOSIS — Z7982 Long term (current) use of aspirin: Secondary | ICD-10-CM | POA: Insufficient documentation

## 2019-04-29 DIAGNOSIS — R911 Solitary pulmonary nodule: Secondary | ICD-10-CM

## 2019-04-29 NOTE — Progress Notes (Signed)
Pt presents today for f/u with Dr. Sondra Come. Pt denies c/o pain. Pt reports variable fatigue since XRT completed. Pt reports occasional cough with white/tan phlegm. Pt denies hemoptysis. Pt denies difficulty swallowing. Pt reports SOB with exertion. Pt reports constipation. Assured pt that was not from XRT but encouraged pt and wife to use OTC stool softeners/laxatives.  BP (!) 141/64 (BP Location: Left Arm, Patient Position: Sitting, Cuff Size: Large)   Pulse 62   Temp 99.1 F (37.3 C)   Resp 20   Ht 6\' 1"  (1.854 m)   Wt 208 lb 12.8 oz (94.7 kg)   SpO2 98%   BMI 27.55 kg/m   Wt Readings from Last 3 Encounters:  04/29/19 208 lb 12.8 oz (94.7 kg)  02/27/19 213 lb (96.6 kg)  02/15/19 216 lb 12.8 oz (98.3 kg)   Loma Sousa, RN BSN

## 2019-04-29 NOTE — Progress Notes (Signed)
Radiation Oncology         [phone] ________________________________  Name: Jonathon [name] MRN: [id number]  Date: 04/29/2019  DOB: Sep 17, 1950  Follow-Up Visit Note  CC: [name], MD  Marshell Garfinkel, MD    ICD-10-CM   1. Solitary pulmonary nodule  R91.1     Diagnosis: Presumptive right lung cancer,clinical stage I  Interval Since Last Radiation: One month, one week, and two days  Radiation Treatment Dates: 03/13/2019 through 03/20/2019 Site Technique Total Dose (Gy) Dose per Fx (Gy) Completed Fx Beam Energies  Lung, Right: Lung_Rt IMRT 54/54 18 3/3 6XFFF    Narrative:  The patient returns today for routine follow-up. No significant interval history since the end of treatment.  On review of systems, he reports variable fatigue, occasional cough with white/tan phlegm, shortness of breath with exertion, and constipation. He denies pain, hemoptysis, and difficulty swallowing.                ALLERGIES:  has No Known Allergies.  Meds: Current Outpatient Medications  Medication Sig Dispense Refill  . albuterol (PROVENTIL) (2.5 MG/3ML) 0.083% nebulizer solution Take 2.5 mg by nebulization every 4 (four) hours as needed for wheezing or shortness of breath.    . Albuterol Sulfate (PROAIR RESPICLICK) 814 (90 BASE) MCG/ACT AEPB Inhale 2 puffs into the lungs 4 (four) times daily as needed. 1 each 5  . amLODipine (NORVASC) 10 MG tablet Take 10 mg by mouth every morning.     Marland Kitchen aspirin EC 325 MG tablet Take 650 mg by mouth every morning.     . bisoprolol (ZEBETA) 5 MG tablet Take 1 tablet (5 mg total) by mouth daily. 30 tablet 0  . budesonide-formoterol (SYMBICORT) 160-4.5 MCG/ACT inhaler Inhale 2 puffs into the lungs 2 (two) times daily.    . cloNIDine (CATAPRES) 0.3 MG tablet Take 0.3 mg by mouth 2 (two) times daily.    . Cyanocobalamin (VITAMIN B 12 PO) Take 1 tablet by mouth every morning.     . cyclobenzaprine (FLEXERIL) 5 MG tablet Take 5 mg by mouth at bedtime.     .  dabigatran (PRADAXA) 150 MG CAPS capsule Take 150 mg by mouth 2 (two) times daily.    . DULoxetine (CYMBALTA) 30 MG capsule Take 30 mg by mouth every morning.     . gabapentin (NEURONTIN) 400 MG capsule Take 400 mg by mouth 3 (three) times daily.     . hydrochlorothiazide (MICROZIDE) 12.5 MG capsule Take 1 capsule (12.5 mg total) by mouth daily. 90 capsule 3  . irbesartan (AVAPRO) 150 MG tablet TAKE ONE (1) TABLET BY MOUTH EVERY DAY (Patient taking differently: TAKE ONE (1) TABLET BY MOUTH AS NEEDED FOR BP) 30 tablet 0  . losartan (COZAAR) 50 MG tablet Take 1 tablet (50 mg total) by mouth daily. 90 tablet 3  . metFORMIN (GLUCOPHAGE) 500 MG tablet Take 500 mg by mouth daily with breakfast.    . nitroGLYCERIN (NITROSTAT) 0.4 MG SL tablet Place 0.4 mg under the tongue every 5 (five) minutes as needed for chest pain.    . nortriptyline (PAMELOR) 25 MG capsule Take 25 mg by mouth at bedtime.    . pantoprazole (PROTONIX) 20 MG tablet TAKE 1 TABLET BY MOUTH DAILY 90 tablet 1  . tamsulosin (FLOMAX) 0.4 MG CAPS capsule Take 0.4 mg by mouth daily.    . ticagrelor (BRILINTA) 90 MG TABS tablet Take 1 tablet (90 mg total) by mouth 2 (two) times daily. 90 tablet 1  .  Tiotropium Bromide Monohydrate (SPIRIVA RESPIMAT) 2.5 MCG/ACT AERS Inhale 2 puffs into the lungs daily. 4 g 5  . atorvastatin (LIPITOR) 80 MG tablet      No current facility-administered medications for this encounter.    Physical Findings: The patient is in no acute distress. Patient is alert and oriented.  height is 6\' 1"  (1.854 m) and weight is 208 lb 12.8 oz (94.7 kg). His temperature is 99.1 F (37.3 C). His blood pressure is 141/64 (abnormal) and his pulse is 62. His respiration is 20 and oxygen saturation is 98%.   No significant changes. Lungs are clear to auscultation bilaterally. Heart has regular rate and rhythm. No palpable cervical, supraclavicular, or axillary adenopathy. Abdomen soft, non-tender, normal bowel sounds.   Lab  Findings: Lab Results  Component Value Date   WBC 9.4 08/30/2017   HGB 13.9 08/30/2017   HCT 42.4 08/30/2017   MCV 79.6 08/30/2017   PLT 279.0 08/30/2017    Radiographic Findings: No results found.  Impression:  Presumptive right lung cancer,clinical stage I  The patient is recovering from the effects of radiation.  Mild cough but no signs suggestive of radiation pneumonitis.  He does report some fatigue after starting medication for high cholesterol but I recommended he discuss this with his primary care physician if this continues  Plan: Routine follow-up with radiation oncology in three months.  Prior to this follow-up appointment the patient will be scheduled for a CT scan of the chest  ____________________________________   Blair Promise, PhD, MD  This document serves as a record of services personally performed by Gery Pray, MD. It was created on his behalf by Clerance Lav, a trained medical scribe. The creation of this record is based on the scribe's personal observations and the provider's statements to them. This document has been checked and approved by the attending provider.

## 2019-04-29 NOTE — Patient Instructions (Signed)
Coronavirus (COVID-19) Are you at risk?  Are you at risk for the Coronavirus (COVID-19)?  To be considered HIGH RISK for Coronavirus (COVID-19), you have to meet the following criteria:  . Traveled to [place], [place], [place], [place] or [place]; or in the [place] to [place], [place], [place], or [place]; and have fever, cough, and shortness of breath within the last 2 weeks of travel OR . Been in close contact with a person diagnosed with COVID-19 within the last 2 weeks and have fever, cough, and shortness of breath . IF YOU DO NOT MEET THESE CRITERIA, YOU ARE CONSIDERED LOW RISK FOR COVID-19.  What to do if you are HIGH RISK for COVID-19?  . If you are having a medical emergency, call 911. . Seek medical care right away. Before you go to a doctor's office, urgent care or emergency department, call ahead and tell them about your recent travel, contact with someone diagnosed with COVID-19, and your symptoms. You should receive instructions from your physician's office regarding next steps of care.  . When you arrive at healthcare provider, tell the healthcare staff immediately you have returned from visiting [place], [place], [place], [place] or [place]; or traveled in the [place] to [place], [place], [place], or [place]; in the last two weeks or you have been in close contact with a person diagnosed with COVID-19 in the last 2 weeks.   . Tell the health care staff about your symptoms: fever, cough and shortness of breath. . After you have been seen by a medical provider, you will be either: o Tested for (COVID-19) and discharged home on quarantine except to seek medical care if symptoms worsen, and asked to  - Stay home and avoid contact with others until you get your results (4-5 days)  - Avoid travel on public transportation if possible (such as bus, train, or airplane) or o Sent to the Emergency Department by EMS for evaluation, COVID-19 testing, and possible  admission depending on your condition and test results.  What to do if you are LOW RISK for COVID-19?  Reduce your risk of any infection by using the same precautions used for avoiding the common cold or flu:  . Wash your hands often with soap and warm water for at least 20 seconds.  If soap and water are not readily available, use an alcohol-based hand sanitizer with at least 60% alcohol.  . If coughing or sneezing, cover your mouth and nose by coughing or sneezing into the elbow areas of your shirt or coat, into a tissue or into your sleeve (not your hands). . Avoid shaking hands with others and consider head nods or verbal greetings only. . Avoid touching your eyes, nose, or mouth with unwashed hands.  . Avoid close contact with people who are sick. . Avoid places or events with large numbers of people in one location, like concerts or sporting events. . Carefully consider travel plans you have or are making. . If you are planning any travel outside or inside the [place], visit the CDC's Travelers' Health webpage for the latest health notices. . If you have some symptoms but not all symptoms, continue to monitor at home and seek medical attention if your symptoms worsen. . If you are having a medical emergency, call 911.   ADDITIONAL HEALTHCARE OPTIONS FOR PATIENTS  [facility] / e-Visit: [email or web address]         [facility]: [phone]  [facility]: [phone]                   [facility]: [phone]   

## 2019-05-01 ENCOUNTER — Other Ambulatory Visit: Payer: Self-pay | Admitting: Cardiology

## 2019-05-01 DIAGNOSIS — I10 Essential (primary) hypertension: Secondary | ICD-10-CM

## 2019-05-02 ENCOUNTER — Other Ambulatory Visit: Payer: Self-pay | Admitting: Cardiology

## 2019-05-03 ENCOUNTER — Ambulatory Visit: Payer: Medicare Other | Admitting: Cardiology

## 2019-05-07 ENCOUNTER — Encounter: Payer: Self-pay | Admitting: Cardiology

## 2019-05-07 ENCOUNTER — Other Ambulatory Visit: Payer: Self-pay

## 2019-05-07 ENCOUNTER — Ambulatory Visit: Payer: Medicare Other | Admitting: Cardiology

## 2019-05-07 VITALS — BP 123/80 | HR 67 | Temp 98.0°F | Resp 14 | Ht 73.0 in | Wt 211.6 lb

## 2019-05-07 DIAGNOSIS — Z8673 Personal history of transient ischemic attack (TIA), and cerebral infarction without residual deficits: Secondary | ICD-10-CM

## 2019-05-07 DIAGNOSIS — I10 Essential (primary) hypertension: Secondary | ICD-10-CM

## 2019-05-07 DIAGNOSIS — I712 Thoracic aortic aneurysm, without rupture: Secondary | ICD-10-CM

## 2019-05-07 DIAGNOSIS — E7800 Pure hypercholesterolemia, unspecified: Secondary | ICD-10-CM

## 2019-05-07 DIAGNOSIS — I2510 Atherosclerotic heart disease of native coronary artery without angina pectoris: Secondary | ICD-10-CM

## 2019-05-07 DIAGNOSIS — I7123 Aneurysm of the descending thoracic aorta, without rupture: Secondary | ICD-10-CM

## 2019-05-07 NOTE — Progress Notes (Signed)
Primary Physician/Referring:  [name], MD  Patient ID: Jonathon [name], male    DOB: 07-Oct-1950, 69 y.o.   MRN: [id number]  Chief Complaint  Patient presents with  . Coronary Artery Disease  . Follow-up    3 month   HPI:    Jonathon [name]  is a 69 y.o.  [name] male patient with history of hypertension, hyperlipidemia, remote stroke without restrictive defect, tobacco cessation in September 2018, CAD with PCI in December 2017 to RCA. He also has history of emphysema, pulmonary nodules and right pneumothorax in 2010 due to emphysematous bleb while he was in [place] and has history of right upper lobe lobectomy for lung cancer.  He has had recurrence of cancer in the right lower lobe and is presently undergoing radiation therapy.  Patient is here on a 3 month visit. He fortunately he remains asymptomatic except for chronic dyspnea and cough.  No recurrence of chest pain. He reports dyspnea has been stable.  States that due to marked constipation and not feeling well it discontinued atorvastatin, he also discontinued Brilinta.  He did not bring his medications today, on his list Pradaxa is also listed and I am not sure of the details.  Past Medical History:  Diagnosis Date  . CHF (congestive heart failure) ([place])   . Chronic cough   . COPD, severe ([place])    PT DENIES REGULAR DAILY INHALER ANY PRN  . Coronary artery disease   . CVA (cerebral vascular accident) ([place]) 02/22/2011   LEFT BRAIN STEM PONTINE HEMORRHAGE--  RIGHT SIDED WEAKNESS  . Diabetes mellitus without complication ([place])    type 2  . Dyspnea   . GERD (gastroesophageal reflux disease)   . Harsh voice quality    PT STATES NORMAL FOR HIM  . High cholesterol   . History of CHF (congestive heart failure) MONITORED BY DR BOUSKA   DIASTOLIC  . Hydrocele, left   . Hypertension   . NSTEMI (non-ST elevated myocardial infarction) ([place]) 12/2015  . Pre-operative clearance    GIVEN BY DR Coletta Memos  . Short of  breath on exertion   . Smoker   . Weakness of right side of body    S/P CVA   FEB 2013   Past Surgical History:  Procedure Laterality Date  . ABDOMINAL ANGIOGRAM  09/04/2012   Procedure: ABDOMINAL ANGIOGRAM;  Surgeon: Laverda Page, MD;  Location: [facility] C[facility] LAB;  Service: Cardiovascular;;  . CARDIAC CATHETERIZATION  05-02-2011  DR Johnsie Cancel   MILD NONOBSTRUCTIVE CAD/ LAD, OM , AND D1/ EF 60^  . CARDIAC CATHETERIZATION  AUG 2000   NON-OBSTRUTIVE CAD/ EF 68%/ 25% PROXIMAL LAD/ 20% MID CIRCUMFLEX  . CARDIAC CATHETERIZATION [name] 12/23/2015   Procedure: Left Heart Cath and Coronary Angiography;  Surgeon: Adrian Prows, MD;  Location: [place] CV [facility];  Service: Cardiovascular;  Laterality: [name];  . CARDIAC CATHETERIZATION [name] 12/24/2015   Procedure: Coronary Stent Intervention;  Surgeon: Adrian Prows, MD;  Location: [place] [facility];  Service: Cardiovascular;  Laterality: [name];  . CORONARY ANGIOPLASTY    . CORONARY STENT PLACEMENT  12/24/2015    PTCA and stenting of the distal RCA   . HYDROCELE EXCISION Left 04/24/2012   Procedure: HYDROCELECTOMY ADULT;  Surgeon: Fredricka Bonine, MD;  Location: [facility];  Service: Urology;  Laterality: Left;  90 mins req for this case     . [place]  . LEFT AND RIGHT HEART CATHETERIZATION WITH CORONARY  ANGIOGRAM [name] 09/04/2012   Procedure: LEFT AND RIGHT HEART CATHETERIZATION WITH CORONARY ANGIOGRAM;  Surgeon: Laverda Page, MD;  Location: [facility] CATH LAB;  Service: Cardiovascular;  Laterality: [name];  . LEFT HEART CATHETERIZATION WITH CORONARY ANGIOGRAM [name] 05/02/2011   Procedure: LEFT HEART CATHETERIZATION WITH CORONARY ANGIOGRAM;  Surgeon: Josue Hector, MD;  Location: [facility] CATH LAB;  Service: Cardiovascular;  Laterality: [name];  . THORACOTOMY/LOBECTOMY Right 03-06-2008   AND STAPLING OF BLEBS FOR SPONTANOUS PNEUMOTHORAX  . TRANSTHORACIC ECHOCARDIOGRAM  02-17-2011   MODERATE LVH/ LVSF NORMAL/ EF 53-61%/ GRADE I DIASTOLIC  DYSFUNCTION   Social History   Tobacco Use  . Smoking status: Former Smoker    Packs/day: 0.00    Years: 53.00    Pack years: 0.00    Types: Cigarettes    Quit date: 2019    Years since quitting: 2.3  . Smokeless tobacco: Never Used  . Tobacco comment: 3-4 cigarettes daily- 08/30/17  Substance Use Topics  . Alcohol use: Yes    Alcohol/week: 0.0 standard drinks    Comment: 02/10/2016 "nothing in the last 10 years"   Marital status: Married   ROS  Review of Systems  Cardiovascular: Negative for chest pain and leg swelling.  Respiratory: Positive for cough, shortness of breath and wheezing.   Gastrointestinal: Negative for melena.   Objective   Vitals with BMI 05/07/2019 04/29/2019 02/27/2019  Height 6' 1"  6' 1"  6' 0"   Weight 211 lbs 10 oz 208 lbs 13 oz 213 lbs  BMI 27.92 44.31 54.00  Systolic 867 619 509  Diastolic 80 64 73  Pulse 67 62 59    Blood pressure 123/80, pulse 67, temperature 98 F (36.7 C), temperature source Temporal, resp. rate 14, height 6' 1"  (1.854 m), weight 211 lb 9.6 oz (96 kg), SpO2 93 %. Body mass index is 27.92 kg/m.   Physical Exam  Constitutional:  Well-built, mildly obese in no acute distress.  Cardiovascular: Normal rate, regular rhythm, normal heart sounds and intact distal pulses. Exam reveals no gallop.  No murmur heard. No leg edema, no JVD.   Pulmonary/Chest: Effort normal.  Prolonged expiration bilateral.  Abdominal: Soft. Bowel sounds are normal.  Musculoskeletal:        General: Normal range of motion.   Radiology: No results found.  Laboratory examination:   POCT A1C (06/27/2018 4:44 PM EDT)  Component Value Ref Range Performed At Pathologist Signature  Hemoglobin A1c 6.9 (A) 4.8 - 5.6 % [facility]    POCT CBC W/DIFF (03/27/2018 4:46 PM EDT)  HGB 15.0 13.5 - 17.5 g/dL [facility]  Hematocrit 48.4 40.5 - 52.5 % [facility]  MCV 82.1 (A) 83.0 - 97.0 fL [facility]  MCH 25.3 (A) 26.0 - 34.0 pg [facility]  MCHC 30.9 (A) 31.0 - 37.0 g/dL [facility]  RDW 17.2 (A) 11.9 - 16 % [facility]  Platelet Count 270 150 - 400 x 10^3/uL [facility]    Lipid Panel With LDL/HDL RatioResulted: 03/28/2018 4:35 AM [facility] Component Name Value Ref Range  Cholesterol, Total 137 100 - 199 mg/dL  Triglycerides 102 0 - 149 mg/dL  HDL 54 >39 mg/dL  VLDL Cholesterol Cal 20 5 - 40 mg/dL  LDL 63 0 - 99 mg/dL  LDL/HDL Ratio 1.2    Comprehensive Metabolic PanelResulted: 04/12/7122 9:36 AM [facility] Component Name Value Ref Range  Glucose 110 (H) 65 - 99 mg/dL  BUN 9 8 - 27 mg/dL  Creatinine, Serum 1.04 0.76 - 1.27 mg/dL  eGFR If NonAfrican American 74 >59 mL/min/1.73  eGFR If African American 85 >59 mL/min/1.73  BUN/Creatinine Ratio 9 (L) 10 - 24   Sodium 136 134 - 144 mmol/L  Potassium 4.2     Medications   Allergies  Allergen Reactions  . Atorvastatin Other (See Comments)    Severe constipation      Current Outpatient Medications  Medication Instructions  . albuterol (PROVENTIL) 2.5 mg, Nebulization, Every 4 hours PRN  . Albuterol Sulfate (PROAIR RESPICLICK) 161 (90 BASE) MCG/ACT AEPB 2 puffs, Inhalation, 4 times daily PRN  . amLODipine (NORVASC) 10 mg, Oral, BH-each morning  . bisoprolol (ZEBETA) 5 mg, Oral, Daily  . budesonide-formoterol (SYMBICORT) 160-4.5 MCG/ACT inhaler 2 puffs, Inhalation, 2 times daily  . cloNIDine (CATAPRES) 0.3 mg, 2 times daily  . Cyanocobalamin (VITAMIN B 12 PO) 1 tablet, Oral, BH-each morning  . cyclobenzaprine (FLEXERIL) 5 mg, Oral, Daily at bedtime  . dabigatran (PRADAXA) 150 mg, Oral, 2 times daily  . DULoxetine (CYMBALTA) 30 mg, Oral, BH-each morning  . gabapentin (NEURONTIN) 400 mg, Oral, 3 times daily  . hydrochlorothiazide (MICROZIDE) 12.5 mg, Oral, Daily  . losartan (COZAAR) 50 mg, Oral, Daily  . metFORMIN  (GLUCOPHAGE) 500 mg, Daily with breakfast  . nitroGLYCERIN (NITROSTAT) 0.4 mg, Sublingual, Every 5 min PRN  . nortriptyline (PAMELOR) 25 mg, Daily at bedtime  . pantoprazole (PROTONIX) 20 MG tablet TAKE 1 TABLET BY MOUTH DAILY  . tamsulosin (FLOMAX) 0.4 mg, Oral, Daily  . Tiotropium Bromide Monohydrate (SPIRIVA RESPIMAT) 2.5 MCG/ACT AERS 2 puffs, Inhalation, Daily   Radiology 01/24/2019: 1. There is a new, lobulated subpleural nodule of the medial right  lower lobe overlying the right aspect of the T8 vertebral body and measuring 1.7 x 1.6 cm. This is concerning for malignancy, however given the recent history of fluctuating subpleural opacities in the right lung and very rapid interval development this may reflect infection or inflammation. Prior history of malignancy does however significantly raise suspicion. Consider PET-CT to characterize for metabolic activity. Absolute minimum, recommend follow-up CT in 3 months to assess for stability resolution. 2. Previously described irregular subpleural opacity of the right lower lobe has resolved. 3. Stable dense paramedian nodule or consolidation of the lingula, previously without FDG avidity. 4. Redemonstrated postoperative findings of right upper lobectomy. 5. Severe, bullous emphysema.  Emphysema (ICD10-J43.9). 6. Three-vessel coronary artery calcifications. 7. Aortic Atherosclerosis (ICD10-I70.0). Unchanged enlargement of the descending thoracic aorta, measuring up to 3.3 x 3.3 cm superiorly. Normal caliber of the remaining portions of the vessel.   Cardiac Studies:   Coronary angiogram 12/24/2015: PTCA and stenting of the distal RCA with 4.0 x 12 mm onyx DES. Normal LVEF. Mild disease in other vessels.  Lexiscan myoview stress test 06/19/2017:  1. Lexiscan stress test was performed. Exercise capacity was not assessed. Stress symptoms included shortness of breath, lightheadedness, and chest pressure. Blood pressure was 126/78 mmHg.  Stress EKG is non diagnostic for ischemia as it is a pharmacologic stress. In addition, it showed normal sinus rhythm, normal stress conduction, no stress arrhythmias and normal stress repolarization.   2. The overall quality of the study is good. There is no evidence of abnormal lung activity. Stress and rest SPECT images demonstrate homogeneous tracer distribution throughout the myocardium. Gated SPECT imaging reveals normal myocardial thickening and wall motion. The left ventricular ejection fraction was calculated as  43%, however visually appears normal. 3. Low to intermediate risk study.  Echocardiogram 08/08/2017:  Left ventricle cavity is normal in size. Mild concentric hypertrophy of the left ventricle. Normal global wall motion. Normal diastolic filling pattern. Calculated EF 52%. Mild to moderate aortic regurgitation. Mild (Grade I) mitral regurgitation. Inadequate tricuspid regurgitation jet to estimate pulmonary artery pressure. Normal right atrial pressure. No significant change compared to previous study on 01/06/2016.  EKG:  EKG 05/07/2019: Normal sinus rhythm with rate of 69 bpm, left atrial enlargement, left axis deviation, left intrafascicular block.  Incomplete right bundle branch block.  LVH.  Nonspecific T abnormality.  Compared to 10/08/2018, left axis deviation is new.    Assessment     ICD-10-CM   1. Coronary artery disease involving native coronary artery of native heart without angina pectoris  I25.10 EKG 12-Lead  2. Descending thoracic aortic aneurysm (HCC) 3.3 cm 2021 CT  I71.2   3. Essential hypertension, benign  I10   4. Hypercholesteremia  E78.00   5. H/O stroke without residual deficits  Z86.73     Recommendations:   Jonathon [name]  is a 69 y.o.[name] male patient with history of hypertension, hyperlipidemia, remote stroke without restrictive defect, tobacco cessation in September 2018, CAD with PCI in December 2017 to RCA. He also has history of  emphysema, pulmonary nodules and right pneumothorax in 2010 due to emphysematous bleb while he was in [place] and has history of right upper lobe lobectomy for lung cancer.  He has had recurrence of cancer in the right lower lobe and is presently undergoing radiation therapy.  He remains asymptomatic from cardiac standpoint without recurrence of angina or clinical evidence of heart failure, blood pressure is well controlled and his lipids are at goal.  However he has recently discontinued atorvastatin due to marked and severe constipation.  However in view of recent malignancy, I did not restart his statins and will continue monitor his overall prognosis.  Advised him that he is on Pradaxa and anticoagulant, I am not sure the indication for this however advised him to discontinue aspirin as his CAD has been stable to reduce the risk of bleeding.  Suspect he was started on Pradaxa due to history of stroke in the past.  He does have descending thoracic aneurysm again we will continue to monitor this clinically for now depending upon his overall long-term prognosis.  I have again encouraged him to bring all his medications to every doctors visit.  I wanted to see him back in 6 months however patient requested I will see him back in 3 months.  I spent 25 minutes reviewing his current medical problems and additional 10 minutes for review of his medications and reconciliation and counseling.  Adrian Prows, MD, [facility] 05/07/2019, 9:27 PM [facility] [place] Office: [phone]

## 2019-05-09 ENCOUNTER — Other Ambulatory Visit (HOSPITAL_COMMUNITY): Payer: Self-pay | Admitting: Family Medicine

## 2019-05-10 ENCOUNTER — Other Ambulatory Visit: Payer: Self-pay | Admitting: Cardiology

## 2019-07-09 ENCOUNTER — Other Ambulatory Visit: Payer: Self-pay | Admitting: Cardiology

## 2019-07-24 ENCOUNTER — Telehealth: Payer: Self-pay | Admitting: *Deleted

## 2019-07-24 NOTE — Telephone Encounter (Signed)
CALLED PATIENT TO INFORM OF CT FOR 07-31-19 - ARRIVAL TIME- 4:15 PM @ [facility], NO RESTRICTIONS TO TEST, AND FU WITH DR. KINARD ON 08-01-19 @ 11:15 AM FOR RESULTS, SPOKE WITH PATIENT AND HE IS AWARE OF THESE APPTS.

## 2019-07-31 ENCOUNTER — Encounter (HOSPITAL_COMMUNITY): Payer: Self-pay

## 2019-07-31 ENCOUNTER — Other Ambulatory Visit: Payer: Self-pay

## 2019-07-31 ENCOUNTER — Ambulatory Visit (HOSPITAL_COMMUNITY)
Admission: RE | Admit: 2019-07-31 | Discharge: 2019-07-31 | Disposition: A | Discharge status: Home / Self Care | Payer: Medicare Other | Source: Ambulatory Visit | Attending: Radiation Oncology | Admitting: Radiation Oncology

## 2019-07-31 DIAGNOSIS — R911 Solitary pulmonary nodule: Secondary | ICD-10-CM | POA: Diagnosis present

## 2019-07-31 NOTE — Progress Notes (Signed)
Radiation Oncology         [phone] ________________________________  Name: Jonathon [name] MRN: [id number]  Date: 08/01/2019  DOB: 1950/11/12  Follow-Up Visit Note  CC: [name], MD  Marshell Garfinkel, MD    ICD-10-CM   1. Solitary pulmonary nodule  R91.1   2. Abnormal CT of the chest  R93.89     Diagnosis: Presumptive right lung cancer,clinical stage I  Interval Since Last Radiation: Four months, one week, and five days.  Radiation Treatment Dates: 03/13/2019 through 03/20/2019 Site Technique Total Dose (Gy) Dose per Fx (Gy) Completed Fx Beam Energies  Lung, Right: Lung_Rt IMRT 54/54 18 3/3 6XFFF    Narrative:  The patient returns today for routine follow-up. Since his last visit, he underwent a restaging chest CT scan yesterday, 07/31/2019. Results showed apparent interval mixed response to therapy. Since prior studies of January 2021, there was development of a 3.1 x 2.0 cm soft tissue lesion in the central infrahilar right lung with additional new right lung nodules that measured up to 1.9 cm, highly concerning for metastatic disease. The paraspinal right lung pleural based lesion seen on previous studies and noted to be hypermetabolic on the previous PET-CT had decreased significantly in the interval. Borderline right paratracheal lymphadenopathy was stable. Finally, the pleural-based nodule in the medial lingula was unchanged in the interval.  On review of systems, he reports right lower abdominal pain rated 7/10. He also reports productive cough with white sputum and shortness of breath when he sits for a bowel movement, when he stands to wash his hands, and when walking up steps. He denies significant cough or hemoptysis.  He denies any new headaches.            ALLERGIES:  is allergic to atorvastatin.  Meds: Current Outpatient Medications  Medication Sig Dispense Refill  . albuterol (PROVENTIL) (2.5 MG/3ML) 0.083% nebulizer solution Take 2.5 mg by nebulization  every 4 (four) hours as needed for wheezing or shortness of breath.    . Albuterol Sulfate (PROAIR RESPICLICK) 008 (90 BASE) MCG/ACT AEPB Inhale 2 puffs into the lungs 4 (four) times daily as needed. 1 each 5  . amLODipine (NORVASC) 10 MG tablet Take 10 mg by mouth every morning.     . bisoprolol (ZEBETA) 5 MG tablet Take 1 tablet (5 mg total) by mouth daily. 30 tablet 0  . budesonide-formoterol (SYMBICORT) 160-4.5 MCG/ACT inhaler Inhale 2 puffs into the lungs 2 (two) times daily.    . cloNIDine (CATAPRES) 0.3 MG tablet Take 0.3 mg by mouth 2 (two) times daily.    . Cyanocobalamin (VITAMIN B 12 PO) Take 1 tablet by mouth every morning.     . cyclobenzaprine (FLEXERIL) 5 MG tablet Take 5 mg by mouth at bedtime.     . dabigatran (PRADAXA) 150 MG CAPS capsule Take 150 mg by mouth 2 (two) times daily.    . DULoxetine (CYMBALTA) 30 MG capsule Take 30 mg by mouth every morning.     . gabapentin (NEURONTIN) 400 MG capsule Take 400 mg by mouth 3 (three) times daily.     . metFORMIN (GLUCOPHAGE) 500 MG tablet Take 500 mg by mouth daily with breakfast.    . nitroGLYCERIN (NITROSTAT) 0.4 MG SL tablet Place 0.4 mg under the tongue every 5 (five) minutes as needed for chest pain.    . nortriptyline (PAMELOR) 25 MG capsule Take 25 mg by mouth at bedtime.    . pantoprazole (PROTONIX) 20 MG tablet TAKE 1 TABLET  BY MOUTH DAILY 90 tablet 1  . tamsulosin (FLOMAX) 0.4 MG CAPS capsule Take 0.4 mg by mouth daily.    . Tiotropium Bromide Monohydrate (SPIRIVA RESPIMAT) 2.5 MCG/ACT AERS Inhale 2 puffs into the lungs daily. 4 g 5   No current facility-administered medications for this encounter.    Physical Findings: The patient is in no acute distress. Patient is alert and oriented.  height is 6\' 1"  (1.854 m) and weight is 207 lb (93.9 kg). His oral temperature is 98 F (36.7 C). His blood pressure is 138/68 and his pulse is 58 (abnormal). His respiration is 18 and oxygen saturation is 95%.   No significant changes.  Lungs are clear to auscultation bilaterally. Heart has regular rate and rhythm. No palpable cervical, supraclavicular, or axillary adenopathy. Abdomen soft, non-tender, normal bowel sounds.  He has some tenderness along the anterior right lower rib cage without any palpable abnormality malleting in this area.  Lab Findings: Lab Results  Component Value Date   WBC 9.4 08/30/2017   HGB 13.9 08/30/2017   HCT 42.4 08/30/2017   MCV 79.6 08/30/2017   PLT 279.0 08/30/2017    Radiographic Findings: CT Chest Wo Contrast  Result Date: 08/01/2019 CLINICAL DATA:  Non-small-cell lung cancer.  Restaging. EXAM: CT CHEST WITHOUT CONTRAST TECHNIQUE: Multidetector CT imaging of the chest was performed following the standard protocol without IV contrast. COMPARISON:  PET-CT 02/07/2019.  Chest CT 01/24/2019. FINDINGS: Cardiovascular: The heart size is normal. No substantial pericardial effusion. Coronary artery calcification is evident. Atherosclerotic calcification is noted in the wall of the thoracic aorta. Mediastinum/Nodes: 10 mm short axis right paratracheal node on 37/2 is stable interval no other mediastinal lymphadenopathy. No bulky adenopathy in the left hilum on this noncontrast study. The esophagus has normal imaging features. There is no axillary lymphadenopathy. Lungs/Pleura: Centrilobular and paraseptal emphysema evident. Scattered areas of architectural distortion and scarring are noted in the bilaterally. There is a new soft tissue lesion in the central infrahilar right lung measuring 3.1 x 2.0 cm (image 95/series 2). 9 x 6 mm medial right lung nodule on 49/5 is new in the interval. 1.9 x 1.3 cm peripheral nodule at the lateral right base along the pleura is new since prior studies (112/5). 1.8 x 1.3 cm paraspinal pleural base lesion seen on previous studies and noted to be hypermetabolic on the previous PET-CT has decreased substantially in the interval with nodular component today measuring only about  5 mm on image 78/5. 1.9 x 1.7 cm pleural base nodule in the lingula (104/5) is similar to prior (1.9 x 1.6 cm when I remeasure in a similar fashion on the previous exam). No pleural effusion. Posteromedial right hemidiaphragm attic hernia contains only fat, stable. Upper Abdomen: Unremarkable. Musculoskeletal: No worrisome lytic or sclerotic osseous abnormality. Subcutaneous nodule upper right paramidline back is similar to prior, likely sebaceous cyst IMPRESSION: 1. Apparent interval mixed response to therapy. 2. Since prior studies of January 2021, there has been development of a 3.1 x 2.0 cm soft tissue lesion in the central infrahilar right lung with additional new right lung nodules measuring up to 1.9 cm. Imaging features highly concerning for metastatic disease. 3. The paraspinal right lung pleural based lesion seen on previous studies and noted to be hypermetabolic on the previous PET-CT has decreased substantially in the interval. 4. Pleural-based nodule in the medial lingula is unchanged in the interval. 5. Stable borderline right paratracheal lymphadenopathy. 6. Aortic Atherosclerosis (ICD10-I70.0) and Emphysema (ICD10-J43.9). Electronically Signed   By:  Misty Stanley [name]   On: 08/01/2019 08:14    Impression:  Presumptive right lung cancer,clinical stage I  No evidence of recurrence on clinical exam today. CT scan of chest from yesterday showed the treated area to be significantly improved, however as above 3 new areas were noted..  I reviewed the chest CT scan findings with the patient and his daughter.  We reviewed the images carefully.  One of the new areas is adjacent to be right lateral chest wall area and may potentially be an area for biopsy without involving much lung.  The right infra hilar region may also be considered accessible to bronchoscopy.    Given these new findings there is more motivation to consider biopsy for tissue diagnosis and to see if he would be a potential candidate  for immunotherapy or targeted therapy  Plan: Patient's case will be presented at the multidisciplinary thoracic oncology conference next week July 22 for input concerning potential biopsy.  Total time spent in this encounter was 25 minutes which included reviewing the patient's most recent chest CT scan, physical examination, scheduling for him for  [place] discussion and documentation. ____________________________________   Blair Promise, PhD, MD  This document serves as a record of services personally performed by Gery Pray, MD. It was created on his behalf by Clerance Lav, a trained medical scribe. The creation of this record is based on the scribe's personal observations and the provider's statements to them. This document has been checked and approved by the attending provider.

## 2019-08-01 ENCOUNTER — Encounter: Payer: Self-pay | Admitting: Radiation Oncology

## 2019-08-01 ENCOUNTER — Other Ambulatory Visit: Payer: Self-pay

## 2019-08-01 ENCOUNTER — Ambulatory Visit
Admission: RE | Admit: 2019-08-01 | Discharge: 2019-08-01 | Disposition: A | Discharge status: Home / Self Care | Payer: Medicare Other | Source: Ambulatory Visit | Attending: Radiation Oncology | Admitting: Radiation Oncology

## 2019-08-01 VITALS — BP 138/68 | HR 58 | Temp 98.0°F | Resp 18 | Ht 73.0 in | Wt 207.0 lb

## 2019-08-01 DIAGNOSIS — Z923 Personal history of irradiation: Secondary | ICD-10-CM | POA: Insufficient documentation

## 2019-08-01 DIAGNOSIS — R911 Solitary pulmonary nodule: Secondary | ICD-10-CM | POA: Diagnosis present

## 2019-08-01 DIAGNOSIS — R9389 Abnormal findings on diagnostic imaging of other specified body structures: Secondary | ICD-10-CM

## 2019-08-01 NOTE — Progress Notes (Signed)
Patient here for a f/u visit and to obtain CT results from 07/31/2019.Patient reports pain in his lower right abdomen for 2-3 months that is constant and is a "7" today. He states it worsens with certain movements.Patient reports shortness of breath when sits for a bowel movement and stands up to wash his hands. He also report extreme shortness of breath even when slowly going up the steps to be.Has frequent coughing with white sputum.    BP 138/68 (BP Location: Left Arm, Patient Position: Sitting)   Pulse (!) 58   Temp 98 F (36.7 C) (Oral)   Resp 18   Ht 6\' 1"  (1.854 m)   Wt 207 lb (93.9 kg)   SpO2 95%   BMI 27.31 kg/m  BP 138/68 (BP Location: Left Arm, Patient Position: Sitting)   Pulse (!) 58   Temp 98 F (36.7 C) (Oral)   Resp 18   Ht 6\' 1"  (1.854 m)   Wt 207 lb (93.9 kg)   SpO2 95%   BMI 27.31 kg/m    Wt Readings from Last 3 Encounters:  08/01/19 207 lb (93.9 kg)  05/07/19 211 lb 9.6 oz (96 kg)  04/29/19 208 lb 12.8 oz (94.7 kg)

## 2019-08-08 ENCOUNTER — Other Ambulatory Visit: Payer: Self-pay | Admitting: Radiation Oncology

## 2019-08-08 ENCOUNTER — Other Ambulatory Visit: Payer: Self-pay | Admitting: *Deleted

## 2019-08-08 DIAGNOSIS — R911 Solitary pulmonary nodule: Secondary | ICD-10-CM

## 2019-08-08 NOTE — Progress Notes (Signed)
The proposed treatment discussed in cancer conference is for discussion purpose only and is not a binding recommendation. The patient was not physically examined nor present for their treatment options. Therefore, final treatment plans cannot be decided.  ?

## 2019-08-13 ENCOUNTER — Ambulatory Visit: Payer: Medicare Other | Admitting: Cardiology

## 2019-08-19 ENCOUNTER — Other Ambulatory Visit: Payer: Self-pay

## 2019-08-19 ENCOUNTER — Ambulatory Visit (HOSPITAL_COMMUNITY)
Admission: RE | Admit: 2019-08-19 | Discharge: 2019-08-19 | Disposition: A | Discharge status: Home / Self Care | Payer: Medicare Other | Source: Ambulatory Visit | Attending: Radiation Oncology | Admitting: Radiation Oncology

## 2019-08-19 DIAGNOSIS — I2584 Coronary atherosclerosis due to calcified coronary lesion: Secondary | ICD-10-CM | POA: Diagnosis not present

## 2019-08-19 DIAGNOSIS — R911 Solitary pulmonary nodule: Secondary | ICD-10-CM | POA: Diagnosis present

## 2019-08-19 DIAGNOSIS — I2510 Atherosclerotic heart disease of native coronary artery without angina pectoris: Secondary | ICD-10-CM | POA: Insufficient documentation

## 2019-08-19 DIAGNOSIS — J439 Emphysema, unspecified: Secondary | ICD-10-CM | POA: Insufficient documentation

## 2019-08-19 LAB — GLUCOSE, CAPILLARY: Glucose-Capillary: 136 mg/dL — ABNORMAL HIGH (ref 70–99)

## 2019-08-19 MED ORDER — FLUDEOXYGLUCOSE F - 18 (FDG) INJECTION
10.6000 | Freq: Once | INTRAVENOUS | Status: AC
  Administered 2019-08-19: 10.6 via INTRAVENOUS

## 2019-08-28 ENCOUNTER — Telehealth: Payer: Self-pay | Admitting: *Deleted

## 2019-08-28 NOTE — Telephone Encounter (Signed)
CALLED PATIENT TO INFORM OF FU WITH DR. KINARD ON 09-02-19 @ 4 PM, LVM FOR A RETURN CALL

## 2019-08-29 ENCOUNTER — Telehealth: Payer: Self-pay | Admitting: *Deleted

## 2019-08-29 ENCOUNTER — Encounter: Payer: Self-pay | Admitting: Cardiology

## 2019-08-29 ENCOUNTER — Other Ambulatory Visit: Payer: Self-pay

## 2019-08-29 ENCOUNTER — Ambulatory Visit: Payer: Medicare Other | Admitting: Cardiology

## 2019-08-29 VITALS — BP 132/69 | HR 71 | Resp 16 | Ht 73.0 in | Wt 205.0 lb

## 2019-08-29 DIAGNOSIS — J432 Centrilobular emphysema: Secondary | ICD-10-CM

## 2019-08-29 DIAGNOSIS — I7123 Aneurysm of the descending thoracic aorta, without rupture: Secondary | ICD-10-CM

## 2019-08-29 DIAGNOSIS — I10 Essential (primary) hypertension: Secondary | ICD-10-CM

## 2019-08-29 DIAGNOSIS — I712 Thoracic aortic aneurysm, without rupture: Secondary | ICD-10-CM

## 2019-08-29 DIAGNOSIS — I2510 Atherosclerotic heart disease of native coronary artery without angina pectoris: Secondary | ICD-10-CM

## 2019-08-29 NOTE — Progress Notes (Signed)
Primary Physician/Referring:  [name], MD  Patient ID: Jonathon [name], male    DOB: [birth year], 69 y.o.   MRN: [id number]  Chief Complaint  Patient presents with  . Coronary Artery Disease  . Hyperlipidemia  . Hypertension  . Follow-up    3 month   HPI:    Jonathon [name]  is a 69 y.o.  [name] male patient with history of hypertension, hyperlipidemia, remote stroke without restrictive defect, tobacco cessation in September 2018, CAD with PCI in December 2017 to RCA. He also has history of emphysema, pulmonary nodules and right pneumothorax in 2010 due to emphysematous bleb while he was in [place] and has history of right upper lobe lobectomy for lung cancer.  He has had recurrence of cancer in the right lower lobe and is presently undergoing radiation therapy.  Patient is here on a 3 month visit. Has chronic dyspnea and cough.  No recurrence of chest pain. He reports dyspnea has been stable. He did not bring his medications today.  At last visit discontinued aspirin as he is on Pradaxa and his CAD has been stable. Patient had also stopped his atorvastatin due to severe constipation. Did not restart statin at that time as patient was undergoing radiation therapy for right lower lobe lung cancer.   I am seeing him at his request, he is presently undergoing evaluation for and management and treatment for recurrence of lung cancer.  Past Medical History:  Diagnosis Date  . CHF (congestive heart failure) ([place])   . Chronic cough   . COPD, severe ([place])    PT DENIES REGULAR DAILY INHALER ANY PRN  . Coronary artery disease   . CVA (cerebral vascular accident) ([place]) 02/22/2011   LEFT BRAIN STEM PONTINE HEMORRHAGE--  RIGHT SIDED WEAKNESS  . Diabetes mellitus without complication ([place])    type 2  . Dyspnea   . GERD (gastroesophageal reflux disease)   . Harsh voice quality    PT STATES NORMAL FOR HIM  . High cholesterol   . History of CHF (congestive heart failure)  MONITORED BY DR BOUSKA   DIASTOLIC  . Hydrocele, left   . Hypertension   . NSTEMI (non-ST elevated myocardial infarction) ([place]) 12/2015  . Pre-operative clearance    GIVEN BY DR Coletta Memos  . Short of breath on exertion   . Smoker   . Weakness of right side of body    S/P CVA   FEB 2013   Past Surgical History:  Procedure Laterality Date  . ABDOMINAL ANGIOGRAM  09/04/2012   Procedure: ABDOMINAL ANGIOGRAM;  Surgeon: Laverda Page, MD;  [facility] C[facility] LAB;  Service: Cardiovascular;;  . CARDIAC CATHETERIZATION  05-02-2011  DR Johnsie Cancel   MILD NONOBSTRUCTIVE CAD/ LAD, OM , AND D1/ EF 60^  . CARDIAC CATHETERIZATION  AUG 2000   NON-OBSTRUTIVE CAD/ EF 68%/ 25% PROXIMAL LAD/ 20% MID CIRCUMFLEX  . CARDIAC CATHETERIZATION [name] 12/23/2015   Procedure: Left Heart Cath and Coronary Angiography;  Surgeon: Adrian Prows, MD;  Location: [place] CV LAB;  Service: Cardiovascular;  Laterality: [name];  . CARDIAC CATHETERIZATION [name] 12/24/2015   Procedure: Coronary Stent Intervention;  Surgeon: Adrian Prows, MD;  Location: [place] CV LAB;  Service: Cardiovascular;  Laterality: [name];  . CORONARY ANGIOPLASTY    . CORONARY STENT PLACEMENT  12/24/2015    PTCA and stenting of the distal RCA   . HYDROCELE EXCISION Left 04/24/2012   Procedure: HYDROCELECTOMY ADULT;  Surgeon: Fredricka Bonine, MD;  Location:  [place];  Service: Urology;  Laterality: Left;  90 mins req for this case     . [place]  . LEFT AND RIGHT HEART CATHETERIZATION WITH CORONARY ANGIOGRAM [name] 09/04/2012   Procedure: LEFT AND RIGHT HEART CATHETERIZATION WITH CORONARY ANGIOGRAM;  Surgeon: Laverda Page, MD;  Location: [facility] CATH LAB;  Service: Cardiovascular;  Laterality: [name];  . LEFT HEART CATHETERIZATION WITH CORONARY ANGIOGRAM [name] 05/02/2011   Procedure: LEFT HEART CATHETERIZATION WITH CORONARY ANGIOGRAM;  Surgeon: Josue Hector, MD;  Location[facility] CATH LAB;  Service: Cardiovascular;  Laterality: [name];    . THORACOTOMY/LOBECTOMY Right 03-06-2008   AND STAPLING OF BLEBS FOR SPONTANOUS PNEUMOTHORAX  . TRANSTHORACIC ECHOCARDIOGRAM  02-17-2011   MODERATE LVH/ LVSF NORMAL/ EF 09-47%/ GRADE I DIASTOLIC DYSFUNCTION   Social History   Tobacco Use  . Smoking status: Former Smoker    Packs/day: 0.00    Years: 53.00    Pack years: 0.00    Types: Cigarettes    Quit date: 2019    Years since quitting: 2.6  . Smokeless tobacco: Never Used  . Tobacco comment: 3-4 cigarettes daily- 08/30/17  Substance Use Topics  . Alcohol use: Yes    Alcohol/week: 0.0 standard drinks    Comment: 02/10/2016 "nothing in the last 10 years"   Marital status: Married   ROS  Review of Systems  Cardiovascular: Negative for chest pain and leg swelling.  Respiratory: Positive for cough, shortness of breath and wheezing.   Gastrointestinal: Negative for melena.   Objective   Vitals with BMI 08/29/2019 08/01/2019 05/07/2019  Height 6' 1"  6' 1"  6' 1"   Weight 205 lbs 207 lbs 211 lbs 10 oz  BMI 27.05 09.62 83.66  Systolic 294 765 465  Diastolic 69 68 80  Pulse 71 58 67    Blood pressure 132/69, pulse 71, resp. rate 16, height 6' 1"  (1.854 m), weight 205 lb (93 kg), SpO2 94 %. Body mass index is 27.05 kg/m.   Physical Exam Constitutional:      Comments: Well-built, mildly obese in no acute distress.  Cardiovascular:     Rate and Rhythm: Normal rate and regular rhythm.     Pulses: Intact distal pulses.     Heart sounds: Normal heart sounds. No murmur heard.  No gallop.      Comments: No leg edema, no JVD.  Pulmonary:     Effort: Pulmonary effort is normal.  Abdominal:     General: Bowel sounds are normal.     Palpations: Abdomen is soft.  Musculoskeletal:        General: Normal range of motion.    Radiology: No results found.  Laboratory examination:   POCT A1C (06/27/2018 4:44 PM EDT)  Component Value Ref Range Performed At Pathologist Signature  Hemoglobin A1c 6.9 (A) 4.8 - 5.6 % [facility]    POCT CBC W/DIFF (03/27/2018 4:46 PM EDT)  HGB 15.0 13.5 - 17.5 g/dL [facility]  Hematocrit 48.4 40.5 - 52.5 % [facility]  MCV 82.1 (A) 83.0 - 97.0 fL [facility]  MCH 25.3 (A) 26.0 - 34.0 pg [facility]  MCHC 30.9 (A) 31.0 - 37.0 g/dL [facility]  RDW 17.2 (A) 11.9 - 16 % [facility]  Platelet Count 270 150 - 400 x 10^3/uL [facility]    Lipid Panel With LDL/HDL RatioResulted:  03/28/2018 4:35 AM [facility] Component Name Value Ref Range  Cholesterol, Total 137 100 - 199 mg/dL  Triglycerides 102 0 - 149 mg/dL  HDL 54 >39 mg/dL  VLDL Cholesterol Cal 20 5 - 40 mg/dL  LDL 63 0 - 99 mg/dL  LDL/HDL Ratio 1.2    Comprehensive Metabolic PanelResulted: 2/95/1884 9:36 AM [facility] Component Name Value Ref Range  Glucose 110 (H) 65 - 99 mg/dL  BUN 9 8 - 27 mg/dL  Creatinine, Serum 1.04 0.76 - 1.27 mg/dL  eGFR If NonAfrican American 74 >59 mL/min/1.73  eGFR If African American 85 >59 mL/min/1.73  BUN/Creatinine Ratio 9 (L) 10 - 24   Sodium 136 134 - 144 mmol/L  Potassium 4.2     Medications   Allergies  Allergen Reactions  . Atorvastatin Other (See Comments)    Severe constipation      Current Outpatient Medications  Medication Instructions  . albuterol (PROVENTIL) 2.5 mg, Nebulization, Every 4 hours PRN  . Albuterol Sulfate (PROAIR RESPICLICK) 166 (90 BASE) MCG/ACT AEPB 2 puffs, Inhalation, 4 times daily PRN  . amLODipine (NORVASC) 10 mg, Oral, BH-each morning  . atorvastatin (LIPITOR) 20 mg, Oral, Daily  . bisoprolol (ZEBETA) 5 mg, Oral, Daily  . budesonide-formoterol (SYMBICORT) 160-4.5 MCG/ACT inhaler 2 puffs, Inhalation, 2 times daily  . cloNIDine (CATAPRES) 0.3 mg, 2 times daily  . Cyanocobalamin (VITAMIN B 12 PO) 1 tablet, Oral, BH-each morning  . cyclobenzaprine (FLEXERIL) 5 mg, Oral, Daily at bedtime  .  dabigatran (PRADAXA) 150 mg, Oral, 2 times daily  . DULoxetine (CYMBALTA) 30 mg, Oral, BH-each morning  . gabapentin (NEURONTIN) 400 mg, Oral, 3 times daily  . losartan (COZAAR) 50 mg, Oral, Daily  . metFORMIN (GLUCOPHAGE) 500 mg, Daily with breakfast  . nitroGLYCERIN (NITROSTAT) 0.4 mg, Sublingual, Every 5 min PRN  . nortriptyline (PAMELOR) 25 mg, Daily at bedtime  . pantoprazole (PROTONIX) 20 MG tablet TAKE 1 TABLET BY MOUTH DAILY  . tamsulosin (FLOMAX) 0.4 mg, Oral, Daily  . Tiotropium Bromide Monohydrate (SPIRIVA RESPIMAT) 2.5 MCG/ACT AERS 2 puffs, Inhalation, Daily   Radiology 01/24/2019: 1. There is a new, lobulated subpleural nodule of the medial right  lower lobe overlying the right aspect of the T8 vertebral body and measuring 1.7 x 1.6 cm. This is concerning for malignancy, however given the recent history of fluctuating subpleural opacities in the right lung and very rapid interval development this may reflect infection or inflammation. Prior history of malignancy does however significantly raise suspicion. Consider PET-CT to characterize for metabolic activity. Absolute minimum, recommend follow-up CT in 3 months to assess for stability resolution. 2. Previously described irregular subpleural opacity of the right lower lobe has resolved. 3. Stable dense paramedian nodule or consolidation of the lingula, previously without FDG avidity. 4. Redemonstrated postoperative findings of right upper lobectomy. 5. Severe, bullous emphysema.  Emphysema (ICD10-J43.9). 6. Three-vessel coronary artery calcifications. 7. Aortic Atherosclerosis (ICD10-I70.0). Unchanged enlargement of the descending thoracic aorta, measuring up to 3.3 x 3.3 cm superiorly. Normal caliber of the remaining portions of the vessel.   Cardiac Studies:   Coronary angiogram 12/24/2015: PTCA and stenting of the distal RCA with 4.0 x 12 mm onyx DES. Normal LVEF. Mild disease in other vessels.  Lexiscan myoview  stress test 06/19/2017:  1. Lexiscan stress test was performed. Exercise capacity was not assessed. Stress symptoms included shortness of breath, lightheadedness, and chest pressure. Blood pressure was 126/78 mmHg. Stress EKG is non diagnostic for ischemia as it is a pharmacologic  stress. In addition, it showed normal sinus rhythm, normal stress conduction, no stress arrhythmias and normal stress repolarization.   2. The overall quality of the study is good. There is no evidence of abnormal lung activity. Stress and rest SPECT images demonstrate homogeneous tracer distribution throughout the myocardium. Gated SPECT imaging reveals normal myocardial thickening and wall motion. The left ventricular ejection fraction was calculated as 43%, however visually appears normal. 3. Low to intermediate risk study.  Echocardiogram 08/08/2017:  Left ventricle cavity is normal in size. Mild concentric hypertrophy of the left ventricle. Normal global wall motion. Normal diastolic filling pattern. Calculated EF 52%. Mild to moderate aortic regurgitation. Mild (Grade I) mitral regurgitation. Inadequate tricuspid regurgitation jet to estimate pulmonary artery pressure. Normal right atrial pressure. No significant change compared to previous study on 01/06/2016.  EKG:  EKG 05/07/2019: Normal sinus rhythm with rate of 69 bpm, left atrial enlargement, left axis deviation, left intrafascicular block.  Incomplete right bundle branch block.  LVH.  Nonspecific T abnormality.  Compared to 10/08/2018, left axis deviation is new.    Assessment     ICD-10-CM   1. Coronary artery disease involving native coronary artery of native heart without angina pectoris  I25.10   2. Essential hypertension, benign  I10   3. Descending thoracic aortic aneurysm (HCC) 3.3 cm 2021 CT  I71.2   4. Centrilobular emphysema (HCC)  J43.2     Recommendations:   Levester A Friedly  is a 69 y.o. [name] male patient with history of  hypertension, hyperlipidemia, remote stroke without restrictive defect, tobacco cessation in September 2018, CAD with PCI in December 2017 to RCA. He also has history of emphysema, pulmonary nodules and right pneumothorax in 2010 due to emphysematous bleb while he was in [place] and has history of right upper lobe lobectomy for lung cancer.  He has had recurrence of cancer in the right lower lobe and is presently undergoing radiation therapy.  I reviewed his recent repeat CT scan of the chest that has revealed 3 additional nodes.  (Not entered in this note the results of the CT).  This is being discussed at the tumor board.  He has an appointment to see his oncologist next week. I have given positive reinforcement.   He remains asymptomatic from cardiac standpoint without recurrence of angina or clinical evidence of heart failure, blood pressure is well controlled and his lipids are at goal.    I have again encouraged him to bring all his medications to every doctors visit.  I wanted to see him back in 6 months however patient requested I will see him back in 3 months.    Adrian Prows, MD, [facility] 08/29/2019, 4:07 PM Office: [phone]

## 2019-08-29 NOTE — Telephone Encounter (Signed)
RETURNED PATIENT'S PHONE CALL, LVM FOR A RETURN CALL 

## 2019-09-02 ENCOUNTER — Encounter: Payer: Self-pay | Admitting: Radiation Oncology

## 2019-09-02 ENCOUNTER — Ambulatory Visit
Admission: RE | Admit: 2019-09-02 | Discharge: 2019-09-02 | Disposition: A | Discharge status: Home / Self Care | Payer: Medicare Other | Source: Ambulatory Visit | Attending: Radiation Oncology | Admitting: Radiation Oncology

## 2019-09-02 ENCOUNTER — Other Ambulatory Visit: Payer: Self-pay

## 2019-09-02 VITALS — BP 127/58 | HR 63 | Temp 98.7°F | Resp 18 | Ht 73.0 in | Wt 200.5 lb

## 2019-09-02 DIAGNOSIS — Z79899 Other long term (current) drug therapy: Secondary | ICD-10-CM | POA: Diagnosis not present

## 2019-09-02 DIAGNOSIS — J439 Emphysema, unspecified: Secondary | ICD-10-CM | POA: Diagnosis not present

## 2019-09-02 DIAGNOSIS — R911 Solitary pulmonary nodule: Secondary | ICD-10-CM | POA: Insufficient documentation

## 2019-09-02 DIAGNOSIS — K8020 Calculus of gallbladder without cholecystitis without obstruction: Secondary | ICD-10-CM | POA: Insufficient documentation

## 2019-09-02 DIAGNOSIS — I2510 Atherosclerotic heart disease of native coronary artery without angina pectoris: Secondary | ICD-10-CM | POA: Insufficient documentation

## 2019-09-02 DIAGNOSIS — R9389 Abnormal findings on diagnostic imaging of other specified body structures: Secondary | ICD-10-CM

## 2019-09-02 NOTE — Progress Notes (Signed)
Patient here to see Dr. Sondra Come and review his CT results. Patient denies any health changes and is concerned about his results.  BP (!) 127/58 (BP Location: Left Arm, Patient Position: Sitting)   Pulse 63   Temp 98.7 F (37.1 C) (Oral)   Resp 18   Ht 6\' 1"  (1.854 m)   Wt 200 lb 8 oz (90.9 kg)   SpO2 96%   BMI 26.45 kg/m   Wt Readings from Last 3 Encounters:  09/02/19 200 lb 8 oz (90.9 kg)  08/29/19 205 lb (93 kg)  08/01/19 207 lb (93.9 kg)

## 2019-09-02 NOTE — Progress Notes (Signed)
Radiation Oncology         [phone] ________________________________  Name: Jonathon [name] MRN: [id number]  Date: 09/02/2019  DOB: [birth year]  Follow-Up Visit Note  CC: [name], MD  Marshell Garfinkel, MD    ICD-10-CM   1. Solitary pulmonary nodule  R91.1   2. Abnormal CT of the chest  R93.89     Diagnosis: Presumptive right lung cancer,clinical stage I  Interval Since Last Radiation: Five months, one week, and six days.  Radiation Treatment Dates: 03/13/2019 through 03/20/2019 Site Technique Total Dose (Gy) Dose per Fx (Gy) Completed Fx Beam Energies  Lung, Right: Lung_Rt IMRT 54/54 18 3/3 6XFFF    Narrative:  The patient returns today for routine follow-up. Since his last visit, he underwent a PET scan on 08/19/2019. Findings of concern on 07/31/2019 chest CT scan were markedly hypermetabolic and compatible with disease recurrence. A right paratracheal lymph node was indicative of mediastinal involvement. There were also noted to be post-operative changes and marked pulmonary emphysema as before. Finally, the right lower lobe abnormality seen on the previous PET-CT scan had resolved.  On review of systems, he reports no complaints. He denies any recent changes in health.  He denies any further pain along the right lower rib cage area.  He denies any hemoptysis.  ALLERGIES:  is allergic to atorvastatin.  Meds: Current Outpatient Medications  Medication Sig Dispense Refill  . albuterol (PROVENTIL) (2.5 MG/3ML) 0.083% nebulizer solution Take 2.5 mg by nebulization every 4 (four) hours as needed for wheezing or shortness of breath.    . Albuterol Sulfate (PROAIR RESPICLICK) 458 (90 BASE) MCG/ACT AEPB Inhale 2 puffs into the lungs 4 (four) times daily as needed. 1 each 5  . amLODipine (NORVASC) 10 MG tablet Take 10 mg by mouth every morning.     Marland Kitchen atorvastatin (LIPITOR) 20 MG tablet Take 20 mg by mouth daily.    . bisoprolol (ZEBETA) 5 MG tablet Take 1 tablet (5 mg  total) by mouth daily. 30 tablet 0  . budesonide-formoterol (SYMBICORT) 160-4.5 MCG/ACT inhaler Inhale 2 puffs into the lungs 2 (two) times daily.    . cloNIDine (CATAPRES) 0.3 MG tablet Take 0.3 mg by mouth 2 (two) times daily.    . Cyanocobalamin (VITAMIN B 12 PO) Take 1 tablet by mouth every morning.     . cyclobenzaprine (FLEXERIL) 5 MG tablet Take 5 mg by mouth at bedtime.     . dabigatran (PRADAXA) 150 MG CAPS capsule Take 150 mg by mouth 2 (two) times daily.    . DULoxetine (CYMBALTA) 30 MG capsule Take 30 mg by mouth every morning.     . gabapentin (NEURONTIN) 400 MG capsule Take 400 mg by mouth 3 (three) times daily.     Marland Kitchen losartan (COZAAR) 50 MG tablet Take 50 mg by mouth daily.    . metFORMIN (GLUCOPHAGE) 500 MG tablet Take 500 mg by mouth daily with breakfast.    . nitroGLYCERIN (NITROSTAT) 0.4 MG SL tablet Place 0.4 mg under the tongue every 5 (five) minutes as needed for chest pain.    . nortriptyline (PAMELOR) 25 MG capsule Take 25 mg by mouth at bedtime.    . pantoprazole (PROTONIX) 20 MG tablet TAKE 1 TABLET BY MOUTH DAILY 90 tablet 1  . tamsulosin (FLOMAX) 0.4 MG CAPS capsule Take 0.4 mg by mouth daily.    . Tiotropium Bromide Monohydrate (SPIRIVA RESPIMAT) 2.5 MCG/ACT AERS Inhale 2 puffs into the lungs daily. 4 g 5  No current facility-administered medications for this encounter.    Physical Findings: The patient is in no acute distress. Patient is alert and oriented.  height is 6\' 1"  (1.854 m) and weight is 200 lb 8 oz (90.9 kg). His oral temperature is 98.7 F (37.1 C). His blood pressure is 127/58 (abnormal) and his pulse is 63. His respiration is 18 and oxygen saturation is 96%.    Lungs are clear to auscultation bilaterally. Heart has regular rate and rhythm. No palpable cervical, supraclavicular, or axillary adenopathy. Abdomen soft, non-tender, normal bowel sounds.   Lab Findings: Lab Results  Component Value Date   WBC 9.4 08/30/2017   HGB 13.9 08/30/2017    HCT 42.4 08/30/2017   MCV 79.6 08/30/2017   PLT 279.0 08/30/2017    Radiographic Findings: NM PET Image Restag (PS) Skull Base To Thigh  Result Date: 08/19/2019 CLINICAL DATA:  Subsequent treatment strategy for non-small cell lung cancer restaging. EXAM: NUCLEAR MEDICINE PET SKULL BASE TO THIGH TECHNIQUE: 10.6 mCi F-18 FDG was injected intravenously. Full-ring PET imaging was performed from the skull base to thigh after the radiotracer. CT data was obtained and used for attenuation correction and anatomic localization. Fasting blood glucose: 135 mg/dl COMPARISON:  Multiple prior studies most recent February 07, 2019 FINDINGS: Mediastinal blood pool activity: SUV max 2.76 Liver activity: SUV max NA NECK: No hypermetabolic lymph nodes in the neck. Incidental CT findings: none CHEST: Resolution of soft tissue associated with parenchymal lesion in the medial RIGHT chest without area of associated increased FDG uptake but with new RIGHT juxta hilar, central mass (image 42, series 8) 3.0 x 2.7 cm as seen on the recent chest CT. (SUVmax = 10.8) Pleural based lesion also new in the peripheral RIGHT chest on image 54 of series 8 measuring approximately 2 cm (SUVmax = 12.5) Small RIGHT paratracheal lymph node (image 62, series 4) (SUVmax = 3.3) New lesion in the RIGHT upper chest (image 23 and 22 of series 8) 8 mm (SUVmax = 5.6) Incidental CT findings: postoperative changes in the chest as before. Nodular area along the LEFT heart border without signs of increased FDG uptake above blood pool activity and without change since the previous PET-CT. Calcified atheromatous plaque throughout the thoracic aorta. Calcified coronary artery disease. No pericardial effusion. Severe pulmonary emphysema. ABDOMEN/PELVIS: No sign of solid organ uptake. Adrenal glands are normal. Expected renal activity. No retroperitoneal adenopathy mild celiac nodal uptake (SUVmax = 4.0) (image 127, series 4) 8 mm. Incidental CT findings:  Cholelithiasis. No pericholecystic stranding. Atheromatous plaque without dilation of the abdominal aorta. No acute abdominal process. No free fluid in the pelvis. SKELETON: No focal hypermetabolic activity to suggest skeletal metastasis. Incidental CT findings: none IMPRESSION: 1. Findings of concern discovered on recent chest CT are markedly hypermetabolic and compatible with disease recurrence. 2. RIGHT paratracheal lymph node indicative of mediastinal involvement. 3. Postoperative changes and marked pulmonary emphysema as before. 4. RIGHT lower lobe abnormality seen on previous PET-CT has resolved. 5. Cholelithiasis. Aortic Atherosclerosis (ICD10-I70.0) and Emphysema (ICD10-J43.9). Electronically Signed   By: Zetta Bills [name]   On: 08/19/2019 14:39    Impression:  Presumptive right lung cancer,clinical stage I  Recent PET scan was compatible with disease recurrence.  We reviewed the patient's most recent PET CT scan.  I showed images to him.  We also discussed that at the multidisciplinary thoracic conference potential biopsy of the lateral lesion would be technically possible with lower risk for pneumothorax.  By obtaining tissue this  may help to guide medical oncology in terms of options for treatment. He Does wish to proceed with this CT-guided biopsy and will place order today.  Plan: CT-guided biopsy of the PET positive lesion along the right lateral chest wall area.  This will then be followed by medical oncology consultation with Dr. Julien Nordmann.  Total time spent in this encounter was 25 minutes which included reviewing the patient's most recent PET scan, physical examination, and documentation and placement of new orders.  ____________________________________   Blair Promise, PhD, MD  This document serves as a record of services personally performed by Gery Pray, MD. It was created on his behalf by Clerance Lav, a trained medical scribe. The creation of this record is based on the  scribe's personal observations and the provider's statements to them. This document has been checked and approved by the attending provider.

## 2019-09-04 ENCOUNTER — Encounter (HOSPITAL_COMMUNITY): Payer: Self-pay | Admitting: Radiology

## 2019-09-04 NOTE — Progress Notes (Signed)
Jonathon [name] Male, 69 y.o., August 08, 1950 MRN:  [id number] Phone:  [phone] (M) Preferred Language: Arabic,E[place]ian PCP:  [name], MD Primary Cvg:  [place] Medicare Next Appt With Radiology ([facility] 3) 09/12/2019 at 1:00 PM  RE: CT Biopsy Received: Hayden Pedro, DO  Garth Bigness D OK for CT guided biopsy, Right pleural based nodule.    To be done with Dr. Lenard Forth       Previous Messages   ----- Message -----  From: Garth Bigness D  Sent: 09/03/2019  9:20 AM EDT  To: Corrie Mckusick, DO  Subject: CT Biopsy                     Procedure:  CT Biopsy   Reason: Solitary pulmonary nodule, pet positive lesion, Schedule with Dr. Dorthula Rue, he is aware of case from thoracic conference   History: NM PET, CT in computer   Provider: Gery Pray   Provider Contact: [phone]

## 2019-09-09 ENCOUNTER — Other Ambulatory Visit (HOSPITAL_COMMUNITY)
Admission: RE | Admit: 2019-09-09 | Discharge: 2019-09-09 | Disposition: A | Discharge status: Home / Self Care | Payer: Medicare Other | Source: Ambulatory Visit | Attending: Radiation Oncology | Admitting: Radiation Oncology

## 2019-09-09 DIAGNOSIS — Z20822 Contact with and (suspected) exposure to covid-19: Secondary | ICD-10-CM | POA: Diagnosis not present

## 2019-09-09 DIAGNOSIS — Z01812 Encounter for preprocedural laboratory examination: Secondary | ICD-10-CM | POA: Insufficient documentation

## 2019-09-09 LAB — SARS CORONAVIRUS 2 (TAT 6-24 HRS): SARS Coronavirus 2: NEGATIVE

## 2019-09-11 ENCOUNTER — Other Ambulatory Visit: Payer: Self-pay | Admitting: Physician Assistant

## 2019-09-12 ENCOUNTER — Ambulatory Visit (HOSPITAL_COMMUNITY)
Admission: RE | Admit: 2019-09-12 | Discharge: 2019-09-12 | Disposition: A | Discharge status: Home / Self Care | Payer: Medicare Other | Source: Ambulatory Visit | Attending: Interventional Radiology | Admitting: Interventional Radiology

## 2019-09-12 ENCOUNTER — Ambulatory Visit (HOSPITAL_COMMUNITY)
Admission: RE | Admit: 2019-09-12 | Discharge: 2019-09-12 | Disposition: A | Discharge status: Home / Self Care | Payer: Medicare Other | Source: Ambulatory Visit | Attending: Radiation Oncology | Admitting: Radiation Oncology

## 2019-09-12 ENCOUNTER — Other Ambulatory Visit: Payer: Self-pay

## 2019-09-12 DIAGNOSIS — I509 Heart failure, unspecified: Secondary | ICD-10-CM | POA: Insufficient documentation

## 2019-09-12 DIAGNOSIS — Z7984 Long term (current) use of oral hypoglycemic drugs: Secondary | ICD-10-CM | POA: Insufficient documentation

## 2019-09-12 DIAGNOSIS — I110 Hypertensive heart disease with heart failure: Secondary | ICD-10-CM | POA: Insufficient documentation

## 2019-09-12 DIAGNOSIS — Z87891 Personal history of nicotine dependence: Secondary | ICD-10-CM | POA: Diagnosis not present

## 2019-09-12 DIAGNOSIS — R918 Other nonspecific abnormal finding of lung field: Secondary | ICD-10-CM | POA: Insufficient documentation

## 2019-09-12 DIAGNOSIS — K219 Gastro-esophageal reflux disease without esophagitis: Secondary | ICD-10-CM | POA: Diagnosis not present

## 2019-09-12 DIAGNOSIS — I2510 Atherosclerotic heart disease of native coronary artery without angina pectoris: Secondary | ICD-10-CM | POA: Insufficient documentation

## 2019-09-12 DIAGNOSIS — R911 Solitary pulmonary nodule: Secondary | ICD-10-CM

## 2019-09-12 DIAGNOSIS — Z8673 Personal history of transient ischemic attack (TIA), and cerebral infarction without residual deficits: Secondary | ICD-10-CM | POA: Diagnosis not present

## 2019-09-12 DIAGNOSIS — Z7901 Long term (current) use of anticoagulants: Secondary | ICD-10-CM | POA: Diagnosis not present

## 2019-09-12 DIAGNOSIS — E119 Type 2 diabetes mellitus without complications: Secondary | ICD-10-CM | POA: Diagnosis not present

## 2019-09-12 DIAGNOSIS — Z79899 Other long term (current) drug therapy: Secondary | ICD-10-CM | POA: Insufficient documentation

## 2019-09-12 DIAGNOSIS — I252 Old myocardial infarction: Secondary | ICD-10-CM | POA: Diagnosis not present

## 2019-09-12 DIAGNOSIS — J95811 Postprocedural pneumothorax: Secondary | ICD-10-CM

## 2019-09-12 LAB — CBC
HCT: 42.9 % (ref 39.0–52.0)
Hemoglobin: 13.2 g/dL (ref 13.0–17.0)
MCH: 24.8 pg — ABNORMAL LOW (ref 26.0–34.0)
MCHC: 30.8 g/dL (ref 30.0–36.0)
MCV: 80.6 fL (ref 80.0–100.0)
Platelets: 304 10*3/uL (ref 150–400)
RBC: 5.32 MIL/uL (ref 4.22–5.81)
RDW: 16.9 % — ABNORMAL HIGH (ref 11.5–15.5)
WBC: 8.6 10*3/uL (ref 4.0–10.5)
nRBC: 0 % (ref 0.0–0.2)

## 2019-09-12 LAB — PROTIME-INR
INR: 1 (ref 0.8–1.2)
Prothrombin Time: 13 seconds (ref 11.4–15.2)

## 2019-09-12 LAB — GLUCOSE, CAPILLARY: Glucose-Capillary: 134 mg/dL — ABNORMAL HIGH (ref 70–99)

## 2019-09-12 MED ORDER — MIDAZOLAM HCL 2 MG/2ML IJ SOLN
INTRAMUSCULAR | Status: AC
  Filled 2019-09-12: qty 2

## 2019-09-12 MED ORDER — LIDOCAINE HCL 1 % IJ SOLN
INTRAMUSCULAR | Status: AC
  Administered 2019-09-12: 20 mL
  Filled 2019-09-12: qty 20

## 2019-09-12 MED ORDER — SODIUM CHLORIDE 0.9 % IV SOLN: INTRAVENOUS | Status: DC

## 2019-09-12 MED ORDER — FENTANYL CITRATE (PF) 100 MCG/2ML IJ SOLN
INTRAMUSCULAR | Status: AC
  Filled 2019-09-12: qty 2

## 2019-09-12 MED ORDER — FENTANYL CITRATE (PF) 100 MCG/2ML IJ SOLN
INTRAMUSCULAR | Status: AC | PRN
  Administered 2019-09-12: 50 ug via INTRAVENOUS
  Administered 2019-09-12: 25 ug via INTRAVENOUS

## 2019-09-12 MED ORDER — MIDAZOLAM HCL 2 MG/2ML IJ SOLN
INTRAMUSCULAR | Status: AC | PRN
  Administered 2019-09-12: 1 mg via INTRAVENOUS
  Administered 2019-09-12: 0.5 mg via INTRAVENOUS

## 2019-09-12 NOTE — Procedures (Signed)
Interventional Radiology Procedure Note  Procedure: CT guided biopsy of right chest wall FDG avid mass Complications: None Recommendations: - Bedrest until CXR cleared.  Minimize talking, coughing or otherwise straining.  - Follow up 1 hr CXR pending  - NPO until CXR cleared  Signed,  Corrie Mckusick, DO

## 2019-09-12 NOTE — Discharge Instructions (Addendum)
Needle Biopsy, Care After These instructions tell you how to care for yourself after your procedure. Your doctor may also give you more specific instructions. Call your doctor if you have any problems or questions. What can I expect after the procedure? After the procedure, it is common to have:  Soreness.  Bruising.  Mild pain. Follow these instructions at home:   Return to your normal activities as told by your doctor. Ask your doctor what activities are safe for you.  Take over-the-counter and prescription medicines only as told by your doctor.  Wash your hands with soap and water before you change your bandage (dressing). If you cannot use soap and water, use hand sanitizer.  Follow instructions from your doctor about: ? How to take care of your puncture site. ? When and how to change your bandage. ? When to remove your bandage.  Check your puncture site every day for signs of infection. Watch for: ? Redness, swelling, or pain. ? Fluid or blood. ? Pus or a bad smell. ? Warmth.  Do not take baths, swim, or use a hot tub until your doctor approves. Ask your doctor if you may take showers. You may only be allowed to take sponge baths.  Keep all follow-up visits as told by your doctor. This is important. Contact a doctor if you have:  A fever.  Redness, swelling, or pain at the puncture site, and it lasts longer than a few days.  Fluid, blood, or pus coming from the puncture site.  Warmth coming from the puncture site. Get help right away if:  You have a lot of bleeding from the puncture site. Summary  After the procedure, it is common to have soreness, bruising, or mild pain at the puncture site.  Check your puncture site every day for signs of infection, such as redness, swelling, or pain.  Get help right away if you have severe bleeding from your puncture site. This information is not intended to replace advice given to you by your health care provider. Make  sure you discuss any questions you have with your health care provider. Document Revised: 01/16/2017 Document Reviewed: 01/16/2017 Elsevier Patient Education  [place]. Moderate Conscious Sedation, Adult Sedation is the use of medicines to promote relaxation and relieve discomfort and anxiety. Moderate conscious sedation is a type of sedation. Under moderate conscious sedation, you are less alert than normal, but you are still able to respond to instructions, touch, or both. Moderate conscious sedation is used during short medical and dental procedures. It is milder than deep sedation, which is a type of sedation under which you cannot be easily woken up. It is also milder than general anesthesia, which is the use of medicines to make you unconscious. Moderate conscious sedation allows you to return to your regular activities sooner. Tell a health care provider about:  Any allergies you have.  All medicines you are taking, including vitamins, herbs, eye drops, creams, and over-the-counter medicines.  Use of steroids (by mouth or creams).  Any problems you or family members have had with sedatives and anesthetic medicines.  Any blood disorders you have.  Any surgeries you have had.  Any medical conditions you have, such as sleep apnea.  Whether you are pregnant or may be pregnant.  Any use of cigarettes, alcohol, marijuana, or street drugs. What are the risks? Generally, this is a safe procedure. However, problems may occur, including:  Getting too much medicine (oversedation).  Nausea.  Allergic reaction to  medicines.  Trouble breathing. If this happens, a breathing tube may be used to help with breathing. It will be removed when you are awake and breathing on your own.  Heart trouble.  Lung trouble. What happens before the procedure? Staying hydrated Follow instructions from your health care provider about hydration, which may include:  Up to 2 hours before the  procedure - you may continue to drink clear liquids, such as water, clear fruit juice, black coffee, and plain tea. Eating and drinking restrictions Follow instructions from your health care provider about eating and drinking, which may include:  8 hours before the procedure - stop eating heavy meals or foods such as meat, fried foods, or fatty foods.  6 hours before the procedure - stop eating light meals or foods, such as toast or cereal.  6 hours before the procedure - stop drinking milk or drinks that contain milk.  2 hours before the procedure - stop drinking clear liquids. Medicine Ask your health care provider about:  Changing or stopping your regular medicines. This is especially important if you are taking diabetes medicines or blood thinners.  Taking medicines such as aspirin and ibuprofen. These medicines can thin your blood. Do not take these medicines before your procedure if your health care provider instructs you not to.  Tests and exams  You will have a physical exam.  You may have blood tests done to show: ? How well your kidneys and liver are working. ? How well your blood can clot. General instructions  Plan to have someone take you home from the hospital or clinic.  If you will be going home right after the procedure, plan to have someone with you for 24 hours. What happens during the procedure?  An IV tube will be inserted into one of your veins.  Medicine to help you relax (sedative) will be given through the IV tube.  The medical or dental procedure will be performed. What happens after the procedure?  Your blood pressure, heart rate, breathing rate, and blood oxygen level will be monitored often until the medicines you were given have worn off.  Do not drive for 24 hours. This information is not intended to replace advice given to you by your health care provider. Make sure you discuss any questions you have with your health care provider. Document  Revised: 12/16/2016 Document Reviewed: 04/25/2015 Elsevier Patient Education  2020 [employer].

## 2019-09-12 NOTE — H&P (Signed)
Chief Complaint: Patient was seen in consultation today for lung nodule biopsy  Referring Physician(s): Gery Pray  Supervising Physician: Corrie Mckusick  Patient Status: [facility] - Out-pt  History of Present Illness: Jonathon [name] is a 69 y.o. male with a medical history significant for CHF, COPD, diabetes mellitus, CVA, and NSTEMI. He had a spontaneous pneumothorax in 2010 and underwent a right upper lobectomy for bullous disease. He has known right lung nodules that have been followed since 2011. A CT chest 01/24/19 done earlier this year showed a new, lobulated subpleural nodule of the medial right lower lobe overlying the right aspect of the T8 vertebral body and measuring 1.7 x 1.6 cm that was moderately concerning for malignancy. Radiation therapy was initiated without a biopsy as it was felt a biopsy was too risky. Repeat imaging was done in July 2021 with results showing disease progression.   CT chest 07/31/19:  IMPRESSION: 1. Apparent interval mixed response to therapy. 2. Since prior studies of January 2021, there has been development of a 3.1 x 2.0 cm soft tissue lesion in the central infrahilar right lung with additional new right lung nodules measuring up to 1.9 cm. Imaging features highly concerning for metastatic disease. 3. The paraspinal right lung pleural based lesion seen on previous studies and noted to be hypermetabolic on the previous PET-CT has decreased substantially in the interval. 4. Pleural-based nodule in the medial lingula is unchanged in the interval. 5. Stable borderline right paratracheal lymphadenopathy. 6. Aortic Atherosclerosis (ICD10-I70.0) and Emphysema (ICD10-J43.9).  Interventional Radiology has been asked to evaluate this patient for an image-guided right pleural based nodule biopsy. This case has been reviewed and procedure approved by Dr. Earleen Newport.    Past Medical History:  Diagnosis Date  . CHF (congestive heart failure) ([place])   .  Chronic cough   . COPD, severe ([place])    PT DENIES REGULAR DAILY INHALER ANY PRN  . Coronary artery disease   . CVA (cerebral vascular accident) ([place]) 02/22/2011   LEFT BRAIN STEM PONTINE HEMORRHAGE--  RIGHT SIDED WEAKNESS  . Diabetes mellitus without complication ([place])    type 2  . Dyspnea   . GERD (gastroesophageal reflux disease)   . Harsh voice quality    PT STATES NORMAL FOR HIM  . High cholesterol   . History of CHF (congestive heart failure) MONITORED BY DR BOUSKA   DIASTOLIC  . Hydrocele, left   . Hypertension   . NSTEMI (non-ST elevated myocardial infarction) ([place]) 12/2015  . Pre-operative clearance    GIVEN BY DR Coletta Memos  . Short of breath on exertion   . Smoker   . Weakness of right side of body    S/P CVA   FEB 2013    Past Surgical History:  Procedure Laterality Date  . ABDOMINAL ANGIOGRAM  09/04/2012   Procedure: ABDOMINAL ANGIOGRAM;  Surgeon: Laverda Page, MD;  Location: [facility] CATH LAB;  Service: Cardiovascular;;  . CARDIAC CATHETERIZATION  05-02-2011  DR Johnsie Cancel   MILD NONOBSTRUCTIVE CAD/ LAD, OM , AND D1/ EF 60^  . CARDIAC CATHETERIZATION  AUG 2000   NON-OBSTRUTIVE CAD/ EF 68%/ 25% PROXIMAL LAD/ 20% MID CIRCUMFLEX  . CARDIAC CATHETERIZATION [name] 12/23/2015   Procedure: Left Heart Cath and Coronary Angiography;  Surgeon: Adrian Prows, MD;  Location: [facility] CV LAB;  Service: Cardiovascular;  Laterality: [name];  . CARDIAC CATHETERIZATION [name] 12/24/2015   Procedure: Coronary Stent Intervention;  Surgeon: Adrian Prows, MD;  Location: [place] CV LAB;  Service:  Cardiovascular;  Laterality: [name];  . CORONARY ANGIOPLASTY    . CORONARY STENT PLACEMENT  12/24/2015    PTCA and stenting of the distal RCA   . HYDROCELE EXCISION Left 04/24/2012   Procedure: HYDROCELECTOMY ADULT;  Surgeon: Fredricka Bonine, MD;  Location: [facility];  Service: Urology;  Laterality: Left;  90 mins req for this case     . [place]  . LEFT AND RIGHT  HEART CATHETERIZATION WITH CORONARY ANGIOGRAM [name] 09/04/2012   Procedure: LEFT AND RIGHT HEART CATHETERIZATION WITH CORONARY ANGIOGRAM;  Surgeon: Laverda Page, MD;  Location: [facility] CATH LAB;  Service: Cardiovascular;  Laterality: [name];  . LEFT HEART CATHETERIZATION WITH CORONARY ANGIOGRAM [name] 05/02/2011   Procedure: LEFT HEART CATHETERIZATION WITH CORONARY ANGIOGRAM;  Surgeon: Josue Hector, MD;  Location: [facility] CATH LAB;  Service: Cardiovascular;  Laterality: [name];  . THORACOTOMY/LOBECTOMY Right 03-06-2008   AND STAPLING OF BLEBS FOR SPONTANOUS PNEUMOTHORAX  . TRANSTHORACIC ECHOCARDIOGRAM  02-17-2011   MODERATE LVH/ LVSF NORMAL/ EF 92-11%/ GRADE I DIASTOLIC DYSFUNCTION    Allergies: Atorvastatin  Medications: Prior to Admission medications   Medication Sig Start Date End Date Taking? Authorizing Provider  albuterol (PROVENTIL) (2.5 MG/3ML) 0.083% nebulizer solution Take 2.5 mg by nebulization every 4 (four) hours as needed for wheezing or shortness of breath.    [provider]  Albuterol Sulfate (PROAIR RESPICLICK) 941 (90 BASE) MCG/ACT AEPB Inhale 2 puffs into the lungs 4 (four) times daily as needed. 02/24/14   Parrett, Fonnie Mu, NP  amLODipine (NORVASC) 10 MG tablet Take 10 mg by mouth every morning.     [provider]  atorvastatin (LIPITOR) 20 MG tablet Take 20 mg by mouth daily. 05/09/19   [provider]  bisoprolol (ZEBETA) 5 MG tablet Take 1 tablet (5 mg total) by mouth daily. 12/26/15   Theodis Blaze, MD  budesonide-formoterol [facility]) 160-4.5 MCG/ACT inhaler Inhale 2 puffs into the lungs 2 (two) times daily.    [provider]  cloNIDine (CATAPRES) 0.3 MG tablet Take 0.3 mg by mouth 2 (two) times daily.    [provider]  Cyanocobalamin (VITAMIN B 12 PO) Take 1 tablet by mouth every morning.     [provider]  cyclobenzaprine (FLEXERIL) 5 MG tablet Take 5 mg by mouth at bedtime.  02/03/14   [provider]  dabigatran  (PRADAXA) 150 MG CAPS capsule Take 150 mg by mouth 2 (two) times daily.    [provider]  DULoxetine (CYMBALTA) 30 MG capsule Take 30 mg by mouth every morning.  02/03/14   [provider]  gabapentin (NEURONTIN) 400 MG capsule Take 400 mg by mouth 3 (three) times daily.     [provider]  losartan (COZAAR) 50 MG tablet Take 50 mg by mouth daily. 08/05/19   [provider]  metFORMIN (GLUCOPHAGE) 500 MG tablet Take 500 mg by mouth daily with breakfast.    [provider]  nitroGLYCERIN (NITROSTAT) 0.4 MG SL tablet Place 0.4 mg under the tongue every 5 (five) minutes as needed for chest pain.    [provider]  nortriptyline (PAMELOR) 25 MG capsule Take 25 mg by mouth at bedtime.    [provider]  pantoprazole (PROTONIX) 20 MG tablet TAKE 1 TABLET BY MOUTH DAILY 03/26/19   Adrian Prows, MD  tamsulosin (FLOMAX) 0.4 MG CAPS capsule Take 0.4 mg by mouth daily. 02/14/19   [provider]  Tiotropium Bromide Monohydrate (SPIRIVA  RESPIMAT) 2.5 MCG/ACT AERS Inhale 2 puffs into the lungs daily. 02/15/19   Mannam, Hart Robinsons, MD  potassium chloride SA (K-DUR,KLOR-CON) 20 MEQ tablet Take 1 tablet (20 mEq total) by mouth 2 (two) times daily. 03/03/11 04/12/11  Bary Leriche, PA-C     Family History  Problem Relation Age of Onset  . Hypertension Mother   . Stroke Mother   . Lung disease Father        died of "colapsed lung" per patient  . Asthma Father   . Diabetes Sister   . Colon cancer Neg Hx   . Stomach cancer Neg Hx     Social History   Socioeconomic History  . Marital status: Married    Spouse name: Not on file  . Number of children: 4  . Years of education: Not on file  . Highest education level: Not on file  Occupational History  . Not on file  Tobacco Use  . Smoking status: Former Smoker    Packs/day: 0.00    Years: 53.00    Pack years: 0.00    Types: Cigarettes    Quit date: 2019    Years since quitting: 2.6  .  Smokeless tobacco: Never Used  . Tobacco comment: 3-4 cigarettes daily- 08/30/17  Vaping Use  . Vaping Use: Never used  Substance and Sexual Activity  . Alcohol use: Yes    Alcohol/week: 0.0 standard drinks    Comment: 02/10/2016 "nothing in the last 10 years"  . Drug use: No  . Sexual activity: Not on file  Other Topics Concern  . Not on file  Social History Narrative  . Not on file   Social Determinants of Health   Financial Resource Strain:   . Difficulty of Paying Living Expenses: Not on file  Food Insecurity:   . Worried About Charity fundraiser in the Last Year: Not on file  . Ran Out of Food in the Last Year: Not on file  Transportation Needs:   . Lack of Transportation (Medical): Not on file  . Lack of Transportation (Non-Medical): Not on file  Physical Activity:   . Days of Exercise per Week: Not on file  . Minutes of Exercise per Session: Not on file  Stress:   . Feeling of Stress : Not on file  Social Connections:   . Frequency of Communication with Friends and Family: Not on file  . Frequency of Social Gatherings with Friends and Family: Not on file  . Attends Religious Services: Not on file  . Active Member of Clubs or Organizations: Not on file  . Attends Archivist Meetings: Not on file  . Marital Status: Not on file    Review of Systems: A 12 point ROS discussed and pertinent positives are indicated in the HPI above.  All other systems are negative.  Review of Systems  Constitutional: Negative for appetite change and fatigue.  Respiratory: Negative for cough and shortness of breath.   Cardiovascular: Negative for chest pain and leg swelling.  Gastrointestinal: Negative for abdominal pain.  Musculoskeletal: Negative for back pain.  Neurological: Negative for light-headedness and headaches.    Vital Signs: BP 102/75   Pulse 64   Temp (!) 97.1 F (36.2 C)   Ht 6' (1.829 m)   Wt 200 lb (90.7 kg)   SpO2 96%   BMI 27.12 kg/m   Physical  Exam Constitutional:      General: He is not in acute distress. HENT:  Mouth/Throat:     Mouth: Mucous membranes are dry.     Pharynx: Oropharynx is clear.     Comments: Poor dental hygiene Cardiovascular:     Rate and Rhythm: Normal rate and regular rhythm.     Pulses: Normal pulses.     Heart sounds: Normal heart sounds.  Pulmonary:     Breath sounds: Examination of the right-upper field reveals decreased breath sounds. Examination of the left-upper field reveals decreased breath sounds. Examination of the right-lower field reveals decreased breath sounds. Examination of the left-lower field reveals decreased breath sounds. Decreased breath sounds present.  Skin:    General: Skin is warm and dry.  Neurological:     Mental Status: He is alert and oriented to person, place, and time.     Imaging: NM PET Image Restag (PS) Skull Base To Thigh  Result Date: 08/19/2019 CLINICAL DATA:  Subsequent treatment strategy for non-small cell lung cancer restaging. EXAM: NUCLEAR MEDICINE PET SKULL BASE TO THIGH TECHNIQUE: 10.6 mCi F-18 FDG was injected intravenously. Full-ring PET imaging was performed from the skull base to thigh after the radiotracer. CT data was obtained and used for attenuation correction and anatomic localization. Fasting blood glucose: 135 mg/dl COMPARISON:  Multiple prior studies most recent February 07, 2019 FINDINGS: Mediastinal blood pool activity: SUV max 2.76 Liver activity: SUV max NA NECK: No hypermetabolic lymph nodes in the neck. Incidental CT findings: none CHEST: Resolution of soft tissue associated with parenchymal lesion in the medial RIGHT chest without area of associated increased FDG uptake but with new RIGHT juxta hilar, central mass (image 42, series 8) 3.0 x 2.7 cm as seen on the recent chest CT. (SUVmax = 10.8) Pleural based lesion also new in the peripheral RIGHT chest on image 54 of series 8 measuring approximately 2 cm (SUVmax = 12.5) Small RIGHT paratracheal  lymph node (image 62, series 4) (SUVmax = 3.3) New lesion in the RIGHT upper chest (image 23 and 22 of series 8) 8 mm (SUVmax = 5.6) Incidental CT findings: postoperative changes in the chest as before. Nodular area along the LEFT heart border without signs of increased FDG uptake above blood pool activity and without change since the previous PET-CT. Calcified atheromatous plaque throughout the thoracic aorta. Calcified coronary artery disease. No pericardial effusion. Severe pulmonary emphysema. ABDOMEN/PELVIS: No sign of solid organ uptake. Adrenal glands are normal. Expected renal activity. No retroperitoneal adenopathy mild celiac nodal uptake (SUVmax = 4.0) (image 127, series 4) 8 mm. Incidental CT findings: Cholelithiasis. No pericholecystic stranding. Atheromatous plaque without dilation of the abdominal aorta. No acute abdominal process. No free fluid in the pelvis. SKELETON: No focal hypermetabolic activity to suggest skeletal metastasis. Incidental CT findings: none IMPRESSION: 1. Findings of concern discovered on recent chest CT are markedly hypermetabolic and compatible with disease recurrence. 2. RIGHT paratracheal lymph node indicative of mediastinal involvement. 3. Postoperative changes and marked pulmonary emphysema as before. 4. RIGHT lower lobe abnormality seen on previous PET-CT has resolved. 5. Cholelithiasis. Aortic Atherosclerosis (ICD10-I70.0) and Emphysema (ICD10-J43.9). Electronically Signed   By: Zetta Bills [name]   On: 08/19/2019 14:39    Labs:  CBC: Recent Labs    09/12/19 1103  WBC 8.6  HGB 13.2  HCT 42.9  PLT 304    COAGS: Recent Labs    09/12/19 1103  INR 1.0    BMP: No results for input(s): NA, K, CL, CO2, GLUCOSE, BUN, CALCIUM, CREATININE, GFRNONAA, GFRAA in the last 8760 hours.  Invalid input(s): CMP  LIVER FUNCTION TESTS: No results for input(s): BILITOT, AST, ALT, ALKPHOS, PROT, ALBUMIN in the last 8760 hours.  TUMOR MARKERS: No results for  input(s): AFPTM, CEA, CA199, CHROMGRNA in the last 8760 hours.  Assessment and Plan:  Right lung nodule: Jonathon [name], 69 year old male, presents today for an image-guided  right pleural based nodule biopsy.   Risks and benefits of CT guided lung nodule biopsy was discussed with the patient including, but not limited to bleeding, hemoptysis, respiratory failure requiring intubation, infection, pneumothorax requiring chest tube placement, stroke from air embolism or even death.  All of the patient's questions were answered and the patient is agreeable to proceed.  The patient has been NPO. Labs and vitals have been reviewed. The patient's medication list shows Pradaxa. Patient states he has not taken Pradaxa in many months. Dr. [name] (cardiology) was called for verification. Dr. [name] stated he has not prescribed this drug in a long time. Dr. [name] encouraged me to call the [facility] where he sends his prescriptions. [facility] stated Pradaxa was not on Mr. Guay's profile. [facility] was listed as the patient's pharmacy in [place]. This pharmacy was also contacted and they stated Pradaxa was not on this patient's profile.   Consent signed and in chart.  Thank you for this interesting consult.  I greatly enjoyed meeting Jonathon [name] and look forward to participating in their care.  A copy of this report was sent to the requesting provider on this date.  Electronically Signed: Soyla Dryer, AGACNP-BC [phone] 09/12/2019, 12:15 PM   I spent a total of  30 Minutes   in face to face in clinical consultation, greater than 50% of which was counseling/coordinating care for an image-guided  right pleural based nodule biopsy.

## 2019-09-16 LAB — SURGICAL PATHOLOGY

## 2019-09-19 ENCOUNTER — Telehealth: Payer: Self-pay | Admitting: Physician Assistant

## 2019-09-19 NOTE — Telephone Encounter (Signed)
Dr. Sondra Come has asked [place] to see this patient for newly diagnosed small cell lung cancer. Dr. Julien Nordmann would like to see him tomorrow at 46 AM. I was unable to reach the patient and left a voicemail that I am calling from medical oncology calling at the request of Dr. Sondra Come. Stated I would try to call him back first thing tomorrow morning, or he can call us back if he recieves this message. Left our name and number. Pending a call back.

## 2019-09-24 ENCOUNTER — Telehealth: Payer: Self-pay | Admitting: Physician Assistant

## 2019-09-24 ENCOUNTER — Telehealth: Payer: Self-pay | Admitting: Medical Oncology

## 2019-09-24 NOTE — Telephone Encounter (Signed)
Pt request to r/s appt with [facility] for anyday ,but Friday. [name] aware.

## 2019-09-24 NOTE — Telephone Encounter (Signed)
With pt next appts on 9/9.

## 2019-09-24 NOTE — Telephone Encounter (Signed)
Jonathon [name] has been cld and scheduled to see [name] on 9/6 at 1pm w/labs at 1230pm. Pt aware to arrive 15 minutes early.

## 2019-09-25 ENCOUNTER — Other Ambulatory Visit: Payer: Self-pay | Admitting: Physician Assistant

## 2019-09-25 DIAGNOSIS — C3490 Malignant neoplasm of unspecified part of unspecified bronchus or lung: Secondary | ICD-10-CM

## 2019-09-26 ENCOUNTER — Inpatient Hospital Stay: Payer: Medicare Other

## 2019-09-26 ENCOUNTER — Other Ambulatory Visit: Payer: Self-pay

## 2019-09-26 ENCOUNTER — Encounter: Payer: Self-pay | Admitting: Internal Medicine

## 2019-09-26 ENCOUNTER — Inpatient Hospital Stay: Payer: Medicare Other | Attending: Physician Assistant | Admitting: Internal Medicine

## 2019-09-26 DIAGNOSIS — F17210 Nicotine dependence, cigarettes, uncomplicated: Secondary | ICD-10-CM | POA: Insufficient documentation

## 2019-09-26 DIAGNOSIS — I509 Heart failure, unspecified: Secondary | ICD-10-CM | POA: Diagnosis not present

## 2019-09-26 DIAGNOSIS — E876 Hypokalemia: Secondary | ICD-10-CM | POA: Diagnosis not present

## 2019-09-26 DIAGNOSIS — Z7189 Other specified counseling: Secondary | ICD-10-CM

## 2019-09-26 DIAGNOSIS — Z7689 Persons encountering health services in other specified circumstances: Secondary | ICD-10-CM | POA: Diagnosis not present

## 2019-09-26 DIAGNOSIS — R590 Localized enlarged lymph nodes: Secondary | ICD-10-CM | POA: Diagnosis not present

## 2019-09-26 DIAGNOSIS — Z923 Personal history of irradiation: Secondary | ICD-10-CM | POA: Insufficient documentation

## 2019-09-26 DIAGNOSIS — Z8673 Personal history of transient ischemic attack (TIA), and cerebral infarction without residual deficits: Secondary | ICD-10-CM | POA: Diagnosis not present

## 2019-09-26 DIAGNOSIS — R42 Dizziness and giddiness: Secondary | ICD-10-CM | POA: Insufficient documentation

## 2019-09-26 DIAGNOSIS — E785 Hyperlipidemia, unspecified: Secondary | ICD-10-CM | POA: Diagnosis not present

## 2019-09-26 DIAGNOSIS — I110 Hypertensive heart disease with heart failure: Secondary | ICD-10-CM | POA: Diagnosis not present

## 2019-09-26 DIAGNOSIS — J449 Chronic obstructive pulmonary disease, unspecified: Secondary | ICD-10-CM | POA: Diagnosis not present

## 2019-09-26 DIAGNOSIS — Z79899 Other long term (current) drug therapy: Secondary | ICD-10-CM | POA: Insufficient documentation

## 2019-09-26 DIAGNOSIS — Z5111 Encounter for antineoplastic chemotherapy: Secondary | ICD-10-CM | POA: Diagnosis present

## 2019-09-26 DIAGNOSIS — Z7901 Long term (current) use of anticoagulants: Secondary | ICD-10-CM | POA: Insufficient documentation

## 2019-09-26 DIAGNOSIS — I2510 Atherosclerotic heart disease of native coronary artery without angina pectoris: Secondary | ICD-10-CM | POA: Diagnosis not present

## 2019-09-26 DIAGNOSIS — C3490 Malignant neoplasm of unspecified part of unspecified bronchus or lung: Secondary | ICD-10-CM | POA: Insufficient documentation

## 2019-09-26 DIAGNOSIS — E119 Type 2 diabetes mellitus without complications: Secondary | ICD-10-CM | POA: Insufficient documentation

## 2019-09-26 DIAGNOSIS — E7800 Pure hypercholesterolemia, unspecified: Secondary | ICD-10-CM | POA: Insufficient documentation

## 2019-09-26 DIAGNOSIS — Z7984 Long term (current) use of oral hypoglycemic drugs: Secondary | ICD-10-CM | POA: Diagnosis not present

## 2019-09-26 DIAGNOSIS — K219 Gastro-esophageal reflux disease without esophagitis: Secondary | ICD-10-CM | POA: Diagnosis not present

## 2019-09-26 DIAGNOSIS — C3491 Malignant neoplasm of unspecified part of right bronchus or lung: Secondary | ICD-10-CM

## 2019-09-26 DIAGNOSIS — I252 Old myocardial infarction: Secondary | ICD-10-CM | POA: Insufficient documentation

## 2019-09-26 LAB — CBC WITH DIFFERENTIAL (CANCER CENTER ONLY)
Abs Immature Granulocytes: 0.03 10*3/uL (ref 0.00–0.07)
Basophils Absolute: 0 10*3/uL (ref 0.0–0.1)
Basophils Relative: 1 %
Eosinophils Absolute: 0.2 10*3/uL (ref 0.0–0.5)
Eosinophils Relative: 2 %
HCT: 43.3 % (ref 39.0–52.0)
Hemoglobin: 13.7 g/dL (ref 13.0–17.0)
Immature Granulocytes: 0 %
Lymphocytes Relative: 25 %
Lymphs Abs: 2.2 10*3/uL (ref 0.7–4.0)
MCH: 25.7 pg — ABNORMAL LOW (ref 26.0–34.0)
MCHC: 31.6 g/dL (ref 30.0–36.0)
MCV: 81.1 fL (ref 80.0–100.0)
Monocytes Absolute: 0.8 10*3/uL (ref 0.1–1.0)
Monocytes Relative: 9 %
Neutro Abs: 5.5 10*3/uL (ref 1.7–7.7)
Neutrophils Relative %: 63 %
Platelet Count: 238 10*3/uL (ref 150–400)
RBC: 5.34 MIL/uL (ref 4.22–5.81)
RDW: 17.2 % — ABNORMAL HIGH (ref 11.5–15.5)
WBC Count: 8.8 10*3/uL (ref 4.0–10.5)
nRBC: 0 % (ref 0.0–0.2)

## 2019-09-26 LAB — CMP (CANCER CENTER ONLY)
ALT: 9 U/L (ref 0–44)
AST: 12 U/L — ABNORMAL LOW (ref 15–41)
Albumin: 3.4 g/dL — ABNORMAL LOW (ref 3.5–5.0)
Alkaline Phosphatase: 77 U/L (ref 38–126)
Anion gap: 7 (ref 5–15)
BUN: 8 mg/dL (ref 8–23)
CO2: 27 mmol/L (ref 22–32)
Calcium: 9.4 mg/dL (ref 8.9–10.3)
Chloride: 100 mmol/L (ref 98–111)
Creatinine: 1.14 mg/dL (ref 0.61–1.24)
GFR, Est AFR Am: >60 mL/min (ref >60)
GFR, Estimated: >60 mL/min (ref >60)
Glucose, Bld: 132 mg/dL — ABNORMAL HIGH (ref 70–99)
Potassium: 3.8 mmol/L (ref 3.5–5.1)
Sodium: 134 mmol/L — ABNORMAL LOW (ref 135–145)
Total Bilirubin: 0.4 mg/dL (ref 0.3–1.2)
Total Protein: 8.1 g/dL (ref 6.5–8.1)

## 2019-09-26 MED ORDER — PROCHLORPERAZINE MALEATE 10 MG PO TABS: 10.0000 mg | ORAL_TABLET | Freq: Four times a day (QID) | ORAL | 0 refills | Status: DC | PRN

## 2019-09-26 NOTE — Progress Notes (Signed)
[place] [phone]   [phone]  CONSULT NOTE  REFERRING PHYSICIAN: Dr. Gery Pray  REASON FOR CONSULTATION:  69 years old white male recently diagnosed with lung cancer.  HPI Jonathon [name] is a 69 y.o. male with past medical history significant for hypertension, diabetes mellitus, stroke, COPD, congestive heart failure, coronary artery disease status post myocardial infarction, dyslipidemia, GERD as well as long history for smoking.  The patient mentioned that in 2010 he had wedge resection of a portion of his right lung for inflammatory process.  He was followed by Dr. Vaughan Browner for COPD and CT scan of the chest on 09/07/2017 showed stable focal nodular opacification over the lingula abutting the pleural service of the mediastinum measuring 2.1 x 2.3 cm slightly more nodular rounded appearance there was also stable 1.1 cm right paratracheal lymph node and a new 1.5 cm focus of nodular pleural-based opacification over the lateral right upper lobe of the lung likely scarring.  A PET scan on 09/28/2017 showed the right lower lobe pleural-based nodular lesion is moderately hypermetabolic with SUV max of 3.9 the lesion is moderately concerning for malignancy.  The lesion along the lingula is relatively low in activity.  The small nodule at the right lung base had low FDG uptake.  The patient was followed by observation and repeat CT scan of the chest without contrast on January 24, 2019 showed a new lobulated subpleural nodule of the medial right lower lobe overlying the right aspect of the T8 vertebral body and measuring 1.7 x 1.6 cm concerning for malignancy.  The previously described irregular subpleural opacities of the right lower lobe had resolved.  The patient had repeat PET scan on February 07, 2019 and it showed the pleural-based 1.5 x 2.0 cm right lower lobe nodule has a maximum SUV of 8.4 suspicious for malignancy.  Because of the risk of biopsy the  patient was treated empirically with SBRT under the care of Dr. Sondra Come completed on March 20, 2019.  Repeat CT scan of the chest without contrast on July 31, 2019 showed new soft tissue lesion in the central infrahilar right lung measuring 3.1 x 2.0 cm.  There was also 0.9 x 0.6 cm medial right lung nodule that is new in the interval.  There was also 1.9 x 1.3 cm peripheral nodule at the lateral right base along the pleura that is new from the previous study.  The scan also showed 1.8 x 1.3 cm paraspinal pleural-based lesions that was seen on the previous study and noted to be hypermetabolic has decreased in size in the interval.  The scan also showed 1.9 x 1.7 cm pleural-based nodule in the lingula similar to the prior study.  In the mediastinum there was 1.0 cm short axis right paratracheal node stable and no bulky lymphadenopathy in the left hilum. The patient had another PET scan on 08/19/2019 and it showed findings of concern discovered on recent CT scan are markedly hypermetabolic and compatible with disease recurrence.  There was right paratracheal lymph node indicative of mediastinal involvement. On September 12, 2019 the patient underwent CT-guided biopsy of the right pleural-based nodule by interventional radiology and the final pathology ([id number]) was consistent with small cell carcinoma. By immunohistochemistry, the neoplastic cells are positive for CD56 and synaptophysin but largely negative for chromogranin. The proliferative rate by Ki-67 is greater than 50%. Overall, the findings are consistent  with a small cell carcinoma. The patient was referred to me today for  evaluation and recommendation regarding treatment of his condition. When seen today he continues to have shortness of breath at baseline increased with exertion as well as cough productive of whitish sputum but no significant chest pain or hemoptysis.  He lost around 40 pounds in the last 2 years.  He has no nausea, vomiting,  diarrhea or constipation.  He denied having any headache or visual changes. Family history significant for mother died at age 58 was a stroke after hip fracture.  Father had COPD and sister had diabetes mellitus. The patient is married and has 4 children.  He was accompanied by his wife Jonathon [name].  The patient is currently retired and used to own gas stations.  He has a history of smoking more than 1 pack/day for around 56 years and unfortunately continues to smoke.  He has no history of alcohol or drug abuse.  HPI  Past Medical History:  Diagnosis Date  . CHF (congestive heart failure) ([place])   . Chronic cough   . COPD, severe ([place])    PT DENIES REGULAR DAILY INHALER ANY PRN  . Coronary artery disease   . CVA (cerebral vascular accident) ([place]) 02/22/2011   LEFT BRAIN STEM PONTINE HEMORRHAGE--  RIGHT SIDED WEAKNESS  . Diabetes mellitus without complication ([place])    type 2  . Dyspnea   . GERD (gastroesophageal reflux disease)   . Harsh voice quality    PT STATES NORMAL FOR HIM  . High cholesterol   . History of CHF (congestive heart failure) MONITORED BY DR BOUSKA   DIASTOLIC  . Hydrocele, left   . Hypertension   . NSTEMI (non-ST elevated myocardial infarction) ([place]) 12/2015  . Pre-operative clearance    GIVEN BY DR Coletta Memos  . Short of breath on exertion   . Smoker   . Weakness of right side of body    S/P CVA   FEB 2013    Past Surgical History:  Procedure Laterality Date  . ABDOMINAL ANGIOGRAM  09/04/2012   Procedure: ABDOMINAL ANGIOGRAM;  Surgeon: Laverda Page, MD;  Location: [facility] CATH LAB;  Service: Cardiovascular;;  . CARDIAC CATHETERIZATION  05-02-2011  DR Johnsie Cancel   MILD NONOBSTRUCTIVE CAD/ LAD, OM , AND D1/ EF 60^  . CARDIAC CATHETERIZATION  AUG 2000   NON-OBSTRUTIVE CAD/ EF 68%/ 25% PROXIMAL LAD/ 20% MID CIRCUMFLEX  . CARDIAC CATHETERIZATION [name] 12/23/2015   Procedure: Left Heart Cath and Coronary Angiography;  Surgeon: Adrian Prows, MD;  Location: [facility] CV LAB;   Service: Cardiovascular;  Laterality: [name];  . CARDIAC CATHETERIZATION [name] 12/24/2015   Procedure: Coronary Stent Intervention;  Surgeon: Adrian Prows, MD;  Location: [facility] CV LAB;  Service: Cardiovascular;  Laterality: [name];  . CORONARY ANGIOPLASTY    . CORONARY STENT PLACEMENT  12/24/2015    PTCA and stenting of the distal RCA   . HYDROCELE EXCISION Left 04/24/2012   Procedure: HYDROCELECTOMY ADULT;  Surgeon: Fredricka Bonine, MD;  Location: [facility];  Service: Urology;  Laterality: Left;  90 mins req for this case     . [place]  . LEFT AND RIGHT HEART CATHETERIZATION WITH CORONARY ANGIOGRAM [name] 09/04/2012   Procedure: LEFT AND RIGHT HEART CATHETERIZATION WITH CORONARY ANGIOGRAM;  Surgeon: Laverda Page, MD;  Location: [facility] CATH LAB;  Service: Cardiovascular;  Laterality: [name];  . LEFT HEART CATHETERIZATION WITH CORONARY ANGIOGRAM [name] 05/02/2011   Procedure: LEFT HEART CATHETERIZATION WITH CORONARY ANGIOGRAM;  Surgeon: Josue Hector, MD;  Location:  [facility] CATH LAB;  Service: Cardiovascular;  Laterality: [name];  . THORACOTOMY/LOBECTOMY Right 03-06-2008   AND STAPLING OF BLEBS FOR SPONTANOUS PNEUMOTHORAX  . TRANSTHORACIC ECHOCARDIOGRAM  02-17-2011   MODERATE LVH/ LVSF NORMAL/ EF 37-85%/ GRADE I DIASTOLIC DYSFUNCTION    Family History  Problem Relation Age of Onset  . Hypertension Mother   . Stroke Mother   . Lung disease Father        died of "colapsed lung" per patient  . Asthma Father   . Diabetes Sister   . Colon cancer Neg Hx   . Stomach cancer Neg Hx     Social History Social History   Tobacco Use  . Smoking status: Former Smoker    Packs/day: 0.00    Years: 53.00    Pack years: 0.00    Types: Cigarettes    Quit date: 2019    Years since quitting: 2.6  . Smokeless tobacco: Never Used  . Tobacco comment: 3-4 cigarettes daily- 08/30/17  Vaping Use  . Vaping Use: Never used  Substance Use Topics  . Alcohol use: Yes     Alcohol/week: 0.0 standard drinks    Comment: 02/10/2016 "nothing in the last 10 years"  . Drug use: No    Allergies  Allergen Reactions  . Atorvastatin Other (See Comments)    Severe constipation    Current Outpatient Medications  Medication Sig Dispense Refill  . albuterol (PROVENTIL) (2.5 MG/3ML) 0.083% nebulizer solution Take 2.5 mg by nebulization every 4 (four) hours as needed for wheezing or shortness of breath.    . Albuterol Sulfate (PROAIR RESPICLICK) 885 (90 BASE) MCG/ACT AEPB Inhale 2 puffs into the lungs 4 (four) times daily as needed. 1 each 5  . amLODipine (NORVASC) 10 MG tablet Take 10 mg by mouth every morning.     Marland Kitchen atorvastatin (LIPITOR) 20 MG tablet Take 20 mg by mouth daily.    . bisoprolol (ZEBETA) 5 MG tablet Take 1 tablet (5 mg total) by mouth daily. 30 tablet 0  . budesonide-formoterol (SYMBICORT) 160-4.5 MCG/ACT inhaler Inhale 2 puffs into the lungs 2 (two) times daily.    . cloNIDine (CATAPRES) 0.3 MG tablet Take 0.3 mg by mouth 2 (two) times daily.    . Cyanocobalamin (VITAMIN B 12 PO) Take 1 tablet by mouth every morning.     . cyclobenzaprine (FLEXERIL) 5 MG tablet Take 5 mg by mouth at bedtime.     . dabigatran (PRADAXA) 150 MG CAPS capsule Take 150 mg by mouth 2 (two) times daily.    . DULoxetine (CYMBALTA) 30 MG capsule Take 30 mg by mouth every morning.     . gabapentin (NEURONTIN) 400 MG capsule Take 400 mg by mouth 3 (three) times daily.     Marland Kitchen losartan (COZAAR) 50 MG tablet Take 50 mg by mouth daily.    . metFORMIN (GLUCOPHAGE) 500 MG tablet Take 500 mg by mouth daily with breakfast.    . nitroGLYCERIN (NITROSTAT) 0.4 MG SL tablet Place 0.4 mg under the tongue every 5 (five) minutes as needed for chest pain.    . nortriptyline (PAMELOR) 25 MG capsule Take 25 mg by mouth at bedtime.    . pantoprazole (PROTONIX) 20 MG tablet TAKE 1 TABLET BY MOUTH DAILY 90 tablet 1  . tamsulosin (FLOMAX) 0.4 MG CAPS capsule Take 0.4 mg by mouth daily.    . Tiotropium  Bromide Monohydrate (SPIRIVA RESPIMAT) 2.5 MCG/ACT AERS Inhale 2 puffs into the lungs daily. 4 g 5   No  current facility-administered medications for this visit.    Review of Systems  Constitutional: positive for fatigue and weight loss Eyes: negative Ears, nose, mouth, throat, and face: negative Respiratory: positive for cough and dyspnea on exertion Cardiovascular: negative Gastrointestinal: negative Genitourinary:negative Integument/breast: negative Hematologic/lymphatic: negative Musculoskeletal:negative Neurological: negative Behavioral/Psych: negative Endocrine: negative Allergic/Immunologic: negative  Physical Exam  VPX:TGGYI, healthy, no distress, well nourished and well developed SKIN: skin color, texture, turgor are normal, no rashes or significant lesions HEAD: Normocephalic, No masses, lesions, tenderness or abnormalities EYES: normal, PERRLA, Conjunctiva are pink and non-injected EARS: External ears normal, Canals clear OROPHARYNX:no exudate, no erythema and lips, buccal mucosa, and tongue normal  NECK: supple, no adenopathy, no JVD LYMPH:  no palpable lymphadenopathy, no hepatosplenomegaly LUNGS: clear to auscultation , and palpation HEART: regular rate & rhythm, no murmurs and no gallops ABDOMEN:abdomen soft, non-tender, normal bowel sounds and no masses or organomegaly BACK: No CVA tenderness, Range of motion is normal EXTREMITIES:no joint deformities, effusion, or inflammation, no edema  NEURO: alert & oriented x 3 with fluent speech, no focal motor/sensory deficits  PERFORMANCE STATUS: ECOG 1  LABORATORY DATA: Lab Results  Component Value Date   WBC 8.8 09/26/2019   HGB 13.7 09/26/2019   HCT 43.3 09/26/2019   MCV 81.1 09/26/2019   PLT 238 09/26/2019      Chemistry      Component Value Date/Time   NA 134 (L) 09/26/2019 1208   K 3.8 09/26/2019 1208   CL 100 09/26/2019 1208   CO2 27 09/26/2019 1208   BUN 8 09/26/2019 1208   CREATININE 1.14  09/26/2019 1208      Component Value Date/Time   CALCIUM 9.4 09/26/2019 1208   ALKPHOS 77 09/26/2019 1208   AST 12 (L) 09/26/2019 1208   ALT 9 09/26/2019 1208   BILITOT 0.4 09/26/2019 1208       RADIOGRAPHIC STUDIES: CT Biopsy  Result Date: Sep 24, 2019 INDICATION: 69 year old male referred for biopsy of a pleural based mass of the right chest EXAM: CT BIOPSY MEDICATIONS: None. ANESTHESIA/SEDATION: Moderate (conscious) sedation was employed during this procedure. A total of Versed 1.5 mg and Fentanyl 75 mcg was administered intravenously. Moderate Sedation Time: 16 minutes. The patient's level of consciousness and vital signs were monitored continuously by radiology nursing throughout the procedure under my direct supervision. FLUOROSCOPY TIME:  CT COMPLICATIONS: None PROCEDURE: The procedure, risks, benefits, and alternatives were explained to the patient and the patient's family. Specific risks that were addressed included bleeding, infection, pneumothorax, need for further procedure including chest tube placement, chance of delayed pneumothorax or hemorrhage, hemoptysis, nondiagnostic sample, cardiopulmonary collapse, death. Questions regarding the procedure were encouraged and answered. The patient understands and consents to the procedure. Patient was positioned in the left decubitus position on the CT gantry table and a scout CT of the chest was performed for planning purposes. Once angle of approach was determined, the skin and subcutaneous tissues this scan was prepped and draped in the usual sterile fashion, and a sterile drape was applied covering the operative field. A sterile gown and sterile gloves were used for the procedure. Local anesthesia was provided with 1% Lidocaine. The skin and subcutaneous tissues were infiltrated 1% lidocaine for local anesthesia, and a small stab incision was made with an 11 blade scalpel. Using CT guidance, a 17 gauge trocar needle was advanced into the right  pleural basedtarget. After confirmation of the tip, separate 18 gauge core biopsies were performed. These were placed into solution for transportation to  the lab. Biosentry Device was deployed. A final CT image was performed. Patient tolerated the procedure well and remained hemodynamically stable throughout. No complications were encountered and no significant blood loss was encounter IMPRESSION: Status post CT-guided biopsy of right pleural based nodule. Signed, Dulcy Fanny. Dellia Nims, RPVI [facility] Radiology Electronically Signed   By: Corrie Mckusick D.O.   On: 09/12/2019 15:22   DG Chest Port 1 View  Result Date: 09/12/2019 CLINICAL DATA:  Status post biopsy EXAM: PORTABLE CHEST 1 VIEW COMPARISON:  PET-CT August 19, 2019; chest radiograph Jun 02, 2017. FINDINGS: There is extensive underlying emphysematous change with scattered areas of scarring. Areas of relative interstitial prominence in part may represent redistribution of blood flow to viable lung segments. There is an ill-defined mass arising from the pleura on the right inferiorly, unchanged. No pneumothorax evident. No new opacity appreciable. There is postoperative change on the right. The heart size is normal. The pulmonary vascularity reflects underlying emphysematous change. No adenopathy appreciable. No bone lesions. IMPRESSION: No pneumothorax evident. Mass along the posterior right inferior pleura remains. Underlying emphysematous change with areas of interstitial prominence which in part represents redistribution of blood flow to viable lung segments. Postoperative change noted on the right. Stable cardiac silhouette. No adenopathy appreciable by radiography. Electronically Signed   By: Lowella Grip III [name]   On: 09/12/2019 15:42    ASSESSMENT: This is a very pleasant 69 years old white male recently diagnosed with limited stage (T3, N2, M0) small cell lung cancer presented with pulmonary  nodules and pleural-based nodules in the right lung in addition to mediastinal lymphadenopathy diagnosed in August 2021.  The patient was previously treated without biopsy to a pleural-based nodule in the right lower lobe with SBRT in March 2021 but had evidence for disease recurrence and progression.   PLAN: I had a lengthy discussion with the patient and his wife today about his current disease stage, prognosis and treatment options. I personally and independently reviewed the scan images and discussed the result and showed the images to the patient and his wife today. I recommended for the patient to complete the staging work-up by ordering MRI of the brain to rule out brain metastasis. I also discussed with the patient his treatment options which is curative in nature with the limited stage disease.  I recommended for him systemic chemotherapy with carboplatin for AUC of 5 from day 1 and etoposide 100 mg/M2 on days 1, 2 and 3 with Neulasta support every 3 weeks for 4 cycles.  The patient will not be a great candidate for treatment with cisplatin because of his comorbidities.  I also discussed with Dr. Sondra Come consideration of concurrent radiotherapy but because of his significant COPD and emphysema Dr. Sondra Come indicated that the patient will not be a great candidate for concurrent radiotherapy. I discussed with the patient the adverse effect of the chemotherapy including but not limited to alopecia, myelosuppression, nausea and vomiting, peripheral neuropathy, liver or renal dysfunction. He is expected to start the first cycle of this treatment on 10/07/2019. We will arrange for the patient to have a chemotherapy education class before the first dose of his treatment. I will call his pharmacy with prescription for Compazine 10 mg p.o. every 6 hours as needed for nausea. The patient will come back for follow-up visit with the start of the first cycle of his treatment. He and his wife agreed to the  current plan. He was advised to call  immediately if he has any concerning symptoms in the interval. The patient voices understanding of current disease status and treatment options and is in agreement with the current care plan.  All questions were answered. The patient knows to call the clinic with any problems, questions or concerns. We can certainly see the patient much sooner if necessary.  Thank you so much for allowing me to participate in the care of [place]. I will continue to follow up the patient with you and assist in his care.  The total time spent in the appointment was 90 minutes.  Disclaimer: This note was dictated with voice recognition software. Similar sounding words can inadvertently be transcribed and may not be corrected upon review.   Eilleen Kempf September 26, 2019, 1:07 PM

## 2019-09-26 NOTE — Progress Notes (Signed)
START ON PATHWAY REGIMEN - Small Cell Lung     A cycle is every 21 days:     Carboplatin      Etoposide   **Always confirm dose/schedule in your pharmacy ordering system**  Patient Characteristics: Newly Diagnosed, Preoperative or Nonsurgical Candidate (Clinical Staging), First Line, Limited Stage, Nonsurgical Candidate Therapeutic Status: Newly Diagnosed, Preoperative or Nonsurgical Candidate (Clinical Staging) AJCC T Category: cT3 AJCC N Category: cN2 AJCC M Category: cM0 AJCC 8 Stage Grouping: IIIB Stage Classification: Limited Surgical Candidacy: Nonsurgical Candidate Intent of Therapy: Curative Intent, Discussed with Patient 

## 2019-09-30 ENCOUNTER — Encounter: Payer: Self-pay | Admitting: *Deleted

## 2019-09-30 ENCOUNTER — Other Ambulatory Visit: Payer: Self-pay | Admitting: Cardiology

## 2019-09-30 ENCOUNTER — Telehealth: Payer: Self-pay | Admitting: Internal Medicine

## 2019-09-30 NOTE — Progress Notes (Signed)
Jonathon [name] is a new patient of Dr. Worthy Flank with dx of small cell lung cancer.  I followed up on his schedule and did not see his infusion or labs scheduled.  I updated scheduling to call and schedule.

## 2019-09-30 NOTE — Progress Notes (Signed)
.  The following biosimilar Udenyca ([id number]) has been selected for use in this patient per insurance.  Henreitta Leber, PharmD

## 2019-09-30 NOTE — Telephone Encounter (Signed)
Scheduled per los. Called and left detailed msg with call back number. Mailed sept - nov calendar.

## 2019-10-01 NOTE — Progress Notes (Signed)
Pharmacist Chemotherapy Monitoring - Initial Assessment    Anticipated start date: 10/07/19  Regimen:  . Are orders appropriate based on the patient's diagnosis, regimen, and cycle? Yes . Does the plan date match the patient's scheduled date? Yes . Is the sequencing of drugs appropriate? Yes . Are the premedications appropriate for the patient's regimen? Yes . Prior Authorization for treatment is: Approved o If applicable, is the correct biosimilar selected based on the patient's insurance? yes  Organ Function and Labs: Marland Kitchen Are dose adjustments needed based on the patient's renal function, hepatic function, or hematologic function? Yes . Are appropriate labs ordered prior to the start of patient's treatment? Yes . Other organ system assessment, if indicated: [name] . The following baseline labs, if indicated, have been ordered: [name]  Dose Assessment: . Are the drug doses appropriate? Yes . Are the following correct: o Drug concentrations Yes o IV fluid compatible with drug Yes o Administration routes Yes o Timing of therapy Yes . If applicable, does the patient have documented access for treatment and/or plans for port-a-cath placement? not applicable . If applicable, have lifetime cumulative doses been properly documented and assessed? yes Lifetime Dose Tracking  No doses have been documented on this patient for the following tracked chemicals: Doxorubicin, Epirubicin, Idarubicin, Daunorubicin, Mitoxantrone, Bleomycin, Oxaliplatin, Carboplatin, Liposomal Doxorubicin  o   Toxicity Monitoring/Prevention: . The patient has the following take home antiemetics prescribed: Prochlorperazine . The patient has the following take home medications prescribed: [name] . Medication allergies and previous infusion related reactions, if applicable, have been reviewed and addressed. Yes . The patient's current medication list has been assessed for drug-drug interactions with their chemotherapy regimen. no  significant drug-drug interactions were identified on review.  Order Review: . Are the treatment plan orders signed? Yes . Is the patient scheduled to see a provider prior to their treatment? Yes  I verify that I have reviewed each item in the above checklist and answered each question accordingly.  Jonathon [name] 10/01/2019 10:20 AM

## 2019-10-02 ENCOUNTER — Encounter: Payer: Self-pay | Admitting: Internal Medicine

## 2019-10-02 ENCOUNTER — Other Ambulatory Visit: Payer: Self-pay

## 2019-10-02 ENCOUNTER — Inpatient Hospital Stay: Payer: Medicare Other

## 2019-10-02 NOTE — Progress Notes (Signed)
Met with patient/accompanying adult in lobby to introduce myself as Arboriculturist and to offer available resources.  Discussed one-time $1000 Radio broadcast assistant to assist with personal expenses while going through treatment.  Gave him my card if interested in applying and for any additional financial questions or concerns.

## 2019-10-03 ENCOUNTER — Ambulatory Visit (HOSPITAL_COMMUNITY)
Admission: RE | Admit: 2019-10-03 | Discharge: 2019-10-03 | Disposition: A | Discharge status: Home / Self Care | Payer: Medicare Other | Source: Ambulatory Visit | Attending: Internal Medicine | Admitting: Internal Medicine

## 2019-10-03 DIAGNOSIS — C3490 Malignant neoplasm of unspecified part of unspecified bronchus or lung: Secondary | ICD-10-CM | POA: Diagnosis present

## 2019-10-03 MED ORDER — GADOBUTROL 1 MMOL/ML IV SOLN
9.0000 mL | Freq: Once | INTRAVENOUS | Status: AC | PRN
  Administered 2019-10-03: 9 mL via INTRAVENOUS

## 2019-10-07 ENCOUNTER — Inpatient Hospital Stay: Payer: Medicare Other

## 2019-10-07 ENCOUNTER — Telehealth: Payer: Self-pay | Admitting: Internal Medicine

## 2019-10-07 ENCOUNTER — Other Ambulatory Visit: Payer: Self-pay

## 2019-10-07 ENCOUNTER — Inpatient Hospital Stay (HOSPITAL_BASED_OUTPATIENT_CLINIC_OR_DEPARTMENT_OTHER): Payer: Medicare Other | Admitting: Internal Medicine

## 2019-10-07 ENCOUNTER — Encounter: Payer: Self-pay | Admitting: Internal Medicine

## 2019-10-07 VITALS — BP 128/62 | HR 66 | Temp 97.4°F | Resp 18 | Ht 72.0 in | Wt 201.5 lb

## 2019-10-07 DIAGNOSIS — E876 Hypokalemia: Secondary | ICD-10-CM | POA: Diagnosis not present

## 2019-10-07 DIAGNOSIS — C3491 Malignant neoplasm of unspecified part of right bronchus or lung: Secondary | ICD-10-CM

## 2019-10-07 DIAGNOSIS — Z5111 Encounter for antineoplastic chemotherapy: Secondary | ICD-10-CM | POA: Diagnosis not present

## 2019-10-07 LAB — CMP (CANCER CENTER ONLY)
ALT: 13 U/L (ref 0–44)
AST: 11 U/L — ABNORMAL LOW (ref 15–41)
Albumin: 3.1 g/dL — ABNORMAL LOW (ref 3.5–5.0)
Alkaline Phosphatase: 82 U/L (ref 38–126)
Anion gap: 8 (ref 5–15)
BUN: 11 mg/dL (ref 8–23)
CO2: 27 mmol/L (ref 22–32)
Calcium: 8.9 mg/dL (ref 8.9–10.3)
Chloride: 97 mmol/L — ABNORMAL LOW (ref 98–111)
Creatinine: 1.03 mg/dL (ref 0.61–1.24)
GFR, Est AFR Am: >60 mL/min (ref >60)
GFR, Estimated: >60 mL/min (ref >60)
Glucose, Bld: 237 mg/dL — ABNORMAL HIGH (ref 70–99)
Potassium: 3 mmol/L — CL (ref 3.5–5.1)
Sodium: 132 mmol/L — ABNORMAL LOW (ref 135–145)
Total Bilirubin: 0.3 mg/dL (ref 0.3–1.2)
Total Protein: 7.6 g/dL (ref 6.5–8.1)

## 2019-10-07 LAB — CBC WITH DIFFERENTIAL (CANCER CENTER ONLY)
Abs Immature Granulocytes: 0.01 10*3/uL (ref 0.00–0.07)
Basophils Absolute: 0 10*3/uL (ref 0.0–0.1)
Basophils Relative: 0 %
Eosinophils Absolute: 0.1 10*3/uL (ref 0.0–0.5)
Eosinophils Relative: 2 %
HCT: 38.7 % — ABNORMAL LOW (ref 39.0–52.0)
Hemoglobin: 12.6 g/dL — ABNORMAL LOW (ref 13.0–17.0)
Immature Granulocytes: 0 %
Lymphocytes Relative: 22 %
Lymphs Abs: 1.8 10*3/uL (ref 0.7–4.0)
MCH: 26 pg (ref 26.0–34.0)
MCHC: 32.6 g/dL (ref 30.0–36.0)
MCV: 80 fL (ref 80.0–100.0)
Monocytes Absolute: 0.9 10*3/uL (ref 0.1–1.0)
Monocytes Relative: 11 %
Neutro Abs: 5.3 10*3/uL (ref 1.7–7.7)
Neutrophils Relative %: 65 %
Platelet Count: 265 10*3/uL (ref 150–400)
RBC: 4.84 MIL/uL (ref 4.22–5.81)
RDW: 16.7 % — ABNORMAL HIGH (ref 11.5–15.5)
WBC Count: 8.2 10*3/uL (ref 4.0–10.5)
nRBC: 0 % (ref 0.0–0.2)

## 2019-10-07 MED ORDER — SODIUM CHLORIDE 0.9 % IV SOLN
517.5000 mg | Freq: Once | INTRAVENOUS | Status: AC
  Administered 2019-10-07: 520 mg via INTRAVENOUS
  Filled 2019-10-07: qty 52

## 2019-10-07 MED ORDER — SODIUM CHLORIDE 0.9 % IV SOLN
100.0000 mg/m2 | Freq: Once | INTRAVENOUS | Status: AC
  Administered 2019-10-07: 220 mg via INTRAVENOUS
  Filled 2019-10-07: qty 11

## 2019-10-07 MED ORDER — POTASSIUM CHLORIDE 10 MEQ/100ML IV SOLN
10.0000 meq | INTRAVENOUS | Status: AC
  Administered 2019-10-07 (×2): 10 meq via INTRAVENOUS

## 2019-10-07 MED ORDER — PALONOSETRON HCL INJECTION 0.25 MG/5ML
0.2500 mg | Freq: Once | INTRAVENOUS | Status: AC
  Administered 2019-10-07: 0.25 mg via INTRAVENOUS

## 2019-10-07 MED ORDER — SODIUM CHLORIDE 0.9 % IV SOLN
150.0000 mg | Freq: Once | INTRAVENOUS | Status: AC
  Administered 2019-10-07: 150 mg via INTRAVENOUS
  Filled 2019-10-07: qty 150

## 2019-10-07 MED ORDER — POTASSIUM CHLORIDE 10 MEQ/100ML IV SOLN
INTRAVENOUS | Status: AC
  Filled 2019-10-07: qty 200

## 2019-10-07 MED ORDER — PALONOSETRON HCL INJECTION 0.25 MG/5ML
INTRAVENOUS | Status: AC
  Filled 2019-10-07: qty 5

## 2019-10-07 MED ORDER — SODIUM CHLORIDE 0.9 % IV SOLN
10.0000 mg | Freq: Once | INTRAVENOUS | Status: AC
  Administered 2019-10-07: 10 mg via INTRAVENOUS
  Filled 2019-10-07: qty 10

## 2019-10-07 MED ORDER — SODIUM CHLORIDE 0.9 % IV SOLN
Freq: Once | INTRAVENOUS | Status: AC
  Filled 2019-10-07: qty 250

## 2019-10-07 NOTE — Progress Notes (Signed)
[facility] [phone]   [phone]  OFFICE PROGRESS NOTE  [name], MD [address] [place] [address]  DIAGNOSIS:  limited stage (T3, N2, M0) small cell lung cancer presented with pulmonary nodules and pleural-based nodules in the right lung in addition to mediastinal lymphadenopathy diagnosed in August 2021.  The patient was previously treated without biopsy to a pleural-based nodule in the right lower lobe with SBRT in March 2021 but had evidence for disease recurrence and progression.  PRIOR THERAPY: SBRT to right lower lobe pulmonary nodule in March 2021 under the care of Dr. Sondra Come  CURRENT THERAPY: Systemic chemotherapy with carboplatin for AUC of 5 on day 1 and etoposide 100 mg/M2 on days 1, 2 and 3 with Neulasta support every 3 weeks.  First cycle 10/07/2019.  INTERVAL HISTORY: Jonathon [name] 69 y.o. male returns to the clinic today for follow-up visit accompanied by his wife.  The patient is feeling fine today with no concerning complaints except for shortness of breath with exertion.  He denied having any current chest pain, cough or hemoptysis.  He denied having any nausea, vomiting, diarrhea or constipation.  He has no headache or visual changes.  He has no weight loss or night sweats.  The patient had MRI of the brain performed recently and he is here today for discussion of his MRI results as well as the starting the first cycle of his systemic chemotherapy.  MEDICAL HISTORY: Past Medical History:  Diagnosis Date  . CHF (congestive heart failure) ([place])   . Chronic cough   . COPD, severe ([place])    PT DENIES REGULAR DAILY INHALER ANY PRN  . Coronary artery disease   . CVA (cerebral vascular accident) ([place]) 02/22/2011   LEFT BRAIN STEM PONTINE HEMORRHAGE--  RIGHT SIDED WEAKNESS  . Diabetes mellitus without complication ([place])    type 2  . Dyspnea   . GERD (gastroesophageal reflux disease)   . Harsh  voice quality    PT STATES NORMAL FOR HIM  . High cholesterol   . History of CHF (congestive heart failure) MONITORED BY DR BOUSKA   DIASTOLIC  . Hydrocele, left   . Hypertension   . NSTEMI (non-ST elevated myocardial infarction) ([place]) 12/2015  . Pre-operative clearance    GIVEN BY DR Coletta Memos  . Short of breath on exertion   . Smoker   . Weakness of right side of body    S/P CVA   FEB 2013    ALLERGIES:  is allergic to atorvastatin.  MEDICATIONS:  Current Outpatient Medications  Medication Sig Dispense Refill  . albuterol (PROVENTIL) (2.5 MG/3ML) 0.083% nebulizer solution Take 2.5 mg by nebulization every 4 (four) hours as needed for wheezing or shortness of breath.    . Albuterol Sulfate (PROAIR RESPICLICK) 102 (90 BASE) MCG/ACT AEPB Inhale 2 puffs into the lungs 4 (four) times daily as needed. 1 each 5  . amLODipine (NORVASC) 10 MG tablet Take 10 mg by mouth every morning.     Marland Kitchen atorvastatin (LIPITOR) 20 MG tablet Take 20 mg by mouth daily.    . bisoprolol (ZEBETA) 5 MG tablet Take 1 tablet (5 mg total) by mouth daily. 30 tablet 0  . budesonide-formoterol (SYMBICORT) 160-4.5 MCG/ACT inhaler Inhale 2 puffs into the lungs 2 (two) times daily.    . cloNIDine (CATAPRES) 0.3 MG tablet Take 0.3 mg by mouth 2 (two) times daily.    . Cyanocobalamin (VITAMIN B  12 PO) Take 1 tablet by mouth every morning.     . cyclobenzaprine (FLEXERIL) 5 MG tablet Take 5 mg by mouth at bedtime.     . dabigatran (PRADAXA) 150 MG CAPS capsule Take 150 mg by mouth 2 (two) times daily.    . DULoxetine (CYMBALTA) 30 MG capsule Take 30 mg by mouth every morning.     . gabapentin (NEURONTIN) 400 MG capsule Take 400 mg by mouth 3 (three) times daily.     Marland Kitchen losartan (COZAAR) 50 MG tablet Take 50 mg by mouth daily.    . metFORMIN (GLUCOPHAGE) 500 MG tablet Take 500 mg by mouth daily with breakfast.    . nitroGLYCERIN (NITROSTAT) 0.4 MG SL tablet Place 0.4 mg under the tongue every 5 (five) minutes as needed for  chest pain.    . nortriptyline (PAMELOR) 25 MG capsule Take 25 mg by mouth at bedtime.    . pantoprazole (PROTONIX) 20 MG tablet TAKE 1 TABLET BY MOUTH DAILY 90 tablet 2  . prochlorperazine (COMPAZINE) 10 MG tablet Take 1 tablet (10 mg total) by mouth every 6 (six) hours as needed for nausea or vomiting. 30 tablet 0  . tamsulosin (FLOMAX) 0.4 MG CAPS capsule Take 0.4 mg by mouth daily.    . Tiotropium Bromide Monohydrate (SPIRIVA RESPIMAT) 2.5 MCG/ACT AERS Inhale 2 puffs into the lungs daily. 4 g 5   No current facility-administered medications for this visit.    SURGICAL HISTORY:  Past Surgical History:  Procedure Laterality Date  . ABDOMINAL ANGIOGRAM  09/04/2012   Procedure: ABDOMINAL ANGIOGRAM;  Surgeon: Laverda Page, MD;  Location: [facility] CATH LAB;  Service: Cardiovascular;;  . CARDIAC CATHETERIZATION  05-02-2011  DR Johnsie Cancel   MILD NONOBSTRUCTIVE CAD/ LAD, OM , AND D1/ EF 60^  . CARDIAC CATHETERIZATION  AUG 2000   NON-OBSTRUTIVE CAD/ EF 68%/ 25% PROXIMAL LAD/ 20% MID CIRCUMFLEX  . CARDIAC CATHETERIZATION [name] 12/23/2015   Procedure: Left Heart Cath and Coronary Angiography;  Surgeon: Adrian Prows, MD;  Location: [facility] CV LAB;  Service: Cardiovascular;  Laterality: [name];  . CARDIAC CATHETERIZATION [name] 12/24/2015   Procedure: Coronary Stent Intervention;  Surgeon: Adrian Prows, MD;  Location: [facility] CV LAB;  Service: Cardiovascular;  Laterality: [name];  . CORONARY ANGIOPLASTY    . CORONARY STENT PLACEMENT  12/24/2015    PTCA and stenting of the distal RCA   . HYDROCELE EXCISION Left 04/24/2012   Procedure: HYDROCELECTOMY ADULT;  Surgeon: Fredricka Bonine, MD;  Location: [facility];  Service: Urology;  Laterality: Left;  90 mins req for this case     . [place]  . LEFT AND RIGHT HEART CATHETERIZATION WITH CORONARY ANGIOGRAM [name] 09/04/2012   Procedure: LEFT AND RIGHT HEART CATHETERIZATION WITH CORONARY ANGIOGRAM;  Surgeon: Laverda Page, MD;   Location[facility] CATH LAB;  Service: Cardiovascular;  Laterality: [name];  . LEFT HEART CATHETERIZATION WITH CORONARY ANGIOGRAM [name] 05/02/2011   Procedure: LEFT HEART CATHETERIZATION WITH CORONARY ANGIOGRAM;  Surgeon: Josue Hector, MD;  Location: [facility] CATH LAB;  Service: Cardiovascular;  Laterality: [name];  . THORACOTOMY/LOBECTOMY Right 03-06-2008   AND STAPLING OF BLEBS FOR SPONTANOUS PNEUMOTHORAX  . TRANSTHORACIC ECHOCARDIOGRAM  02-17-2011   MODERATE LVH/ LVSF NORMAL/ EF 57-84%/ GRADE I DIASTOLIC DYSFUNCTION    REVIEW OF SYSTEMS:  A comprehensive review of systems was negative except for: Constitutional: positive for fatigue Respiratory: positive for dyspnea on exertion   PHYSICAL EXAMINATION: General appearance: alert, cooperative and  no distress Head: Normocephalic, without obvious abnormality, atraumatic Neck: no adenopathy, no JVD, supple, symmetrical, trachea midline and thyroid not enlarged, symmetric, no tenderness/mass/nodules Lymph nodes: Cervical, supraclavicular, and axillary nodes normal. Resp: clear to auscultation bilaterally Back: symmetric, no curvature. ROM normal. No CVA tenderness. Cardio: regular rate and rhythm, S1, S2 normal, no murmur, click, rub or gallop GI: soft, non-tender; bowel sounds normal; no masses,  no organomegaly Extremities: extremities normal, atraumatic, no cyanosis or edema  ECOG PERFORMANCE STATUS: 1 - Symptomatic but completely ambulatory  Blood pressure 128/62, pulse 66, temperature (!) 97.4 F (36.3 C), temperature source Tympanic, resp. rate 18, height 6' (1.829 m), weight 201 lb 8 oz (91.4 kg), SpO2 95 %.  LABORATORY DATA: Lab Results  Component Value Date   WBC 8.2 10/07/2019   HGB 12.6 (L) 10/07/2019   HCT 38.7 (L) 10/07/2019   MCV 80.0 10/07/2019   PLT 265 10/07/2019      Chemistry      Component Value Date/Time   NA 134 (L) 09/26/2019 1208   K 3.8 09/26/2019 1208   CL 100 09/26/2019 1208   CO2 27 09/26/2019 1208   BUN 8 09/26/2019  1208   CREATININE 1.14 09/26/2019 1208      Component Value Date/Time   CALCIUM 9.4 09/26/2019 1208   ALKPHOS 77 09/26/2019 1208   AST 12 (L) 09/26/2019 1208   ALT 9 09/26/2019 1208   BILITOT 0.4 09/26/2019 1208       RADIOGRAPHIC STUDIES: MR BRAIN W WO CONTRAST  Result Date: 10/04/2019 CLINICAL DATA:  Staging of small cell lung cancer. EXAM: MRI HEAD WITHOUT AND WITH CONTRAST TECHNIQUE: Multiplanar, multiecho pulse sequences of the brain and surrounding structures were obtained without and with intravenous contrast. CONTRAST:  1mL GADAVIST GADOBUTROL 1 MMOL/ML IV SOLN COMPARISON:  Head CT 08/29/2011.  MRI 02/18/2011. FINDINGS: Brain: Diffusion imaging does not show any acute or subacute infarction or other cause of restricted diffusion. There is an old hemorrhagic infarction of the left pons and midbrain with hemosiderin deposition. Ordinary chronic small-vessel changes affect the pons elsewhere. No cerebellar abnormality. Cerebral hemispheres show old small vessel infarctions of the thalami and basal ganglia. Chronic small-vessel ischemic changes are present affecting the deep and subcortical white matter. Old left frontal cortical and subcortical infarction. No evidence of primary or metastatic mass lesion, hemorrhage, hydrocephalus or extra-axial collection. No abnormal leptomeningeal enhancement otherwise. Vascular: Major vessels at the base of the brain show flow. Skull and upper cervical spine: Negative Sinuses/Orbits: Clear/normal Other: None IMPRESSION: 1. No evidence of metastatic disease. 2. Old hemorrhagic infarction of the left pons and midbrain with hemosiderin deposition. 3. Extensive chronic small-vessel ischemic changes throughout the brain as outlined above. Old left frontal cortical and subcortical infarction Electronically Signed   By: Nelson Chimes [name]   On: 10/04/2019 08:28   CT Biopsy  Result Date: 09/12/2019 INDICATION: 69 year old male referred for biopsy of a pleural  based mass of the right chest EXAM: CT BIOPSY MEDICATIONS: None. ANESTHESIA/SEDATION: Moderate (conscious) sedation was employed during this procedure. A total of Versed 1.5 mg and Fentanyl 75 mcg was administered intravenously. Moderate Sedation Time: 16 minutes. The patient's level of consciousness and vital signs were monitored continuously by radiology nursing throughout the procedure under my direct supervision. FLUOROSCOPY TIME:  CT COMPLICATIONS: None PROCEDURE: The procedure, risks, benefits, and alternatives were explained to the patient and the patient's family. Specific risks that were addressed included bleeding, infection, pneumothorax, need for further procedure including chest tube  placement, chance of delayed pneumothorax or hemorrhage, hemoptysis, nondiagnostic sample, cardiopulmonary collapse, death. Questions regarding the procedure were encouraged and answered. The patient understands and consents to the procedure. Patient was positioned in the left decubitus position on the CT gantry table and a scout CT of the chest was performed for planning purposes. Once angle of approach was determined, the skin and subcutaneous tissues this scan was prepped and draped in the usual sterile fashion, and a sterile drape was applied covering the operative field. A sterile gown and sterile gloves were used for the procedure. Local anesthesia was provided with 1% Lidocaine. The skin and subcutaneous tissues were infiltrated 1% lidocaine for local anesthesia, and a small stab incision was made with an 11 blade scalpel. Using CT guidance, a 17 gauge trocar needle was advanced into the right pleural basedtarget. After confirmation of the tip, separate 18 gauge core biopsies were performed. These were placed into solution for transportation to the lab. Biosentry Device was deployed. A final CT image was performed. Patient tolerated the procedure well and remained hemodynamically stable throughout. No complications  were encountered and no significant blood loss was encounter IMPRESSION: Status post CT-guided biopsy of right pleural based nodule. Signed, Dulcy Fanny. Dellia Nims, RPVI [facility] Radiology Electronically Signed   By: Corrie Mckusick D.O.   On: 09/12/2019 15:22   DG Chest Port 1 View  Result Date: 09/12/2019 CLINICAL DATA:  Status post biopsy EXAM: PORTABLE CHEST 1 VIEW COMPARISON:  PET-CT August 19, 2019; chest radiograph Jun 02, 2017. FINDINGS: There is extensive underlying emphysematous change with scattered areas of scarring. Areas of relative interstitial prominence in part may represent redistribution of blood flow to viable lung segments. There is an ill-defined mass arising from the pleura on the right inferiorly, unchanged. No pneumothorax evident. No new opacity appreciable. There is postoperative change on the right. The heart size is normal. The pulmonary vascularity reflects underlying emphysematous change. No adenopathy appreciable. No bone lesions. IMPRESSION: No pneumothorax evident. Mass along the posterior right inferior pleura remains. Underlying emphysematous change with areas of interstitial prominence which in part represents redistribution of blood flow to viable lung segments. Postoperative change noted on the right. Stable cardiac silhouette. No adenopathy appreciable by radiography. Electronically Signed   By: Lowella Grip III [name]   On: 09/12/2019 15:42    ASSESSMENT AND PLAN: This is a very pleasant 69 years old white male recently diagnosed with limited stage small cell lung cancer (T3, N2, M0) presented with pleural-based pulmonary nodules in the right lung in addition to mediastinal lymphadenopathy in August 2021.  The patient was initially diagnosed with a pleural-based nodule status post SBRT in March 2021. He had MRI of the brain performed recently that showed no evidence for metastatic disease to the brain. I discussed the  results with the patient and his wife today. I recommended for him to proceed with the first cycle of systemic chemotherapy with carboplatin for AUC of 5 from day 1 and to etoposide 100 mg/M2 on days 1, 2 and 3 with Neulasta support every 3 weeks. He will come back for follow-up visit in 1 week for evaluation and management of any adverse effect of his treatment. For the hypokalemia, I will arrange for the patient to receive 20 mEq of potassium chloride IV in the clinic today The patient was advised to call immediately if he has any concerning symptoms in the interval. The patient voices understanding of current disease status and  treatment options and is in agreement with the current care plan.  All questions were answered. The patient knows to call the clinic with any problems, questions or concerns. We can certainly see the patient much sooner if necessary.  Disclaimer: This note was dictated with voice recognition software. Similar sounding words can inadvertently be transcribed and may not be corrected upon review.

## 2019-10-07 NOTE — Progress Notes (Signed)
Critical K+ 3.0 called from [place] in lab. Dr Julien Nordmann made aware.

## 2019-10-07 NOTE — Patient Instructions (Signed)
House Discharge Instructions for Patients Receiving Chemotherapy  Today you received the following chemotherapy agents: carboplatin, etoposide  To help prevent nausea and vomiting after your treatment, we encourage you to take your nausea medication as directed.    If you develop nausea and vomiting that is not controlled by your nausea medication, call the clinic.   BELOW ARE SYMPTOMS THAT SHOULD BE REPORTED IMMEDIATELY:  *FEVER GREATER THAN 100.5 F  *CHILLS WITH OR WITHOUT FEVER  NAUSEA AND VOMITING THAT IS NOT CONTROLLED WITH YOUR NAUSEA MEDICATION  *UNUSUAL SHORTNESS OF BREATH  *UNUSUAL BRUISING OR BLEEDING  TENDERNESS IN MOUTH AND THROAT WITH OR WITHOUT PRESENCE OF ULCERS  *URINARY PROBLEMS  *BOWEL PROBLEMS  UNUSUAL RASH Items with * indicate a potential emergency and should be followed up as soon as possible.  Feel free to call the clinic should you have any questions or concerns. The clinic phone number is [phone].  Please show the [place] at check-in to the Emergency Department and triage nurse.  Potassium chloride injection What is this medicine? POTASSIUM CHLORIDE (poe TASS i um KLOOR ide) is a potassium supplement used to prevent and to treat low potassium. Potassium is important for the heart, muscles, and nerves. Too much or too little potassium in the body can cause serious problems. This medicine may be used for other purposes; ask your health care provider or pharmacist if you have questions. COMMON BRAND NAME(S): PROAMP What should I tell my health care provider before I take this medicine? They need to know if you have any of these conditions:  Addison's disease  dehydration  diabetes  heart disease  high levels of potassium in the blood  irregular heartbeat  kidney disease  recent severe burn  an unusual or allergic reaction to potassium, other medicines, foods, dyes, or preservatives  pregnant or  trying to get pregnant  breast-feeding How should I use this medicine? This medicine is for infusion into a vein. It is given by a health care professional in a hospital or clinic setting. Talk to your pediatrician regarding the use of this medicine in children. Special care may be needed. Overdosage: If you think you have taken too much of this medicine contact a poison control center or emergency room at once. NOTE: This medicine is only for you. Do not share this medicine with others. What if I miss a dose? This does not apply. What may interact with this medicine? Do not take this medicine with any of the following medications:  certain diuretics such as spironolactone, triamterene  eplerenone  sodium polystyrene sulfonate This medicine may also interact with the following medications:  certain medicines for blood pressure or heart disease like lisinopril, losartan, quinapril, valsartan  medicines that lower your chance of fighting infection such as cyclosporine, tacrolimus  NSAIDs, medicines for pain and inflammation, like ibuprofen or naproxen  other potassium supplements  salt substitutes This list may not describe all possible interactions. Give your health care provider a list of all the medicines, herbs, non-prescription drugs, or dietary supplements you use. Also tell them if you smoke, drink alcohol, or use illegal drugs. Some items may interact with your medicine. What should I watch for while using this medicine? Your condition will be monitored carefully while you are receiving this medicine. You may need blood work done while you are taking this medicine. What side effects may I notice from receiving this medicine? Side effects that you should report to your doctor  or health care professional as soon as possible:  allergic reactions like skin rash, itching or hives, swelling of the face, lips, or tongue  breathing problems  confusion  fast, irregular  heartbeat  feeling faint or lightheaded, falls  low blood pressure  numbness or tingling in hands or feet  pain, redness, or irritation at site where injected  unusually weak or tired  weakness, heaviness of legs Side effects that usually do not require medical attention (report to your doctor or health care professional if they continue or are bothersome):  diarrhea  nausea, vomiting  stomach pain This list may not describe all possible side effects. Call your doctor for medical advice about side effects. You may report side effects to FDA at 1-800-FDA-1088. Where should I keep my medicine? This drug is given in a hospital or clinic and will not be stored at home. NOTE: This sheet is a summary. It may not cover all possible information. If you have questions about this medicine, talk to your doctor, pharmacist, or health care provider.  2020 Elsevier/Gold Standard (2015-10-07 13:59:20)

## 2019-10-07 NOTE — Progress Notes (Signed)
Patient complained of pain to IV site shortly after KCl infusion starting. Patency verified, and rate was decreased to 44ml/hr, heat applied, and concurrent saline started with KCl; pt voiced improvement of symptoms.

## 2019-10-07 NOTE — Telephone Encounter (Signed)
R/s 9/24 appt per patient request. Called and left msg.

## 2019-10-08 ENCOUNTER — Other Ambulatory Visit: Payer: Self-pay

## 2019-10-08 ENCOUNTER — Telehealth: Payer: Self-pay | Admitting: Internal Medicine

## 2019-10-08 ENCOUNTER — Inpatient Hospital Stay: Payer: Medicare Other

## 2019-10-08 VITALS — BP 134/62 | HR 53 | Temp 98.6°F | Resp 17

## 2019-10-08 DIAGNOSIS — C3491 Malignant neoplasm of unspecified part of right bronchus or lung: Secondary | ICD-10-CM

## 2019-10-08 DIAGNOSIS — Z5111 Encounter for antineoplastic chemotherapy: Secondary | ICD-10-CM | POA: Diagnosis not present

## 2019-10-08 MED ORDER — SODIUM CHLORIDE 0.9 % IV SOLN
Freq: Once | INTRAVENOUS | Status: AC
  Filled 2019-10-08: qty 250

## 2019-10-08 MED ORDER — ACETAMINOPHEN 325 MG PO TABS
ORAL_TABLET | ORAL | Status: AC
  Filled 2019-10-08: qty 2

## 2019-10-08 MED ORDER — SODIUM CHLORIDE 0.9 % IV SOLN
10.0000 mg | Freq: Once | INTRAVENOUS | Status: AC
  Administered 2019-10-08: 10 mg via INTRAVENOUS
  Filled 2019-10-08: qty 10

## 2019-10-08 MED ORDER — ACETAMINOPHEN 325 MG PO TABS
650.0000 mg | ORAL_TABLET | Freq: Once | ORAL | Status: AC
  Administered 2019-10-08: 650 mg via ORAL

## 2019-10-08 MED ORDER — SODIUM CHLORIDE 0.9 % IV SOLN
100.0000 mg/m2 | Freq: Once | INTRAVENOUS | Status: AC
  Administered 2019-10-08: 220 mg via INTRAVENOUS
  Filled 2019-10-08: qty 11

## 2019-10-08 NOTE — Telephone Encounter (Signed)
Called and left msg about added f/u appt next week.

## 2019-10-08 NOTE — Patient Instructions (Signed)
[facility] Discharge Instructions for Patients Receiving Chemotherapy  Today you received the following chemotherapy agents: etoposide  To help prevent nausea and vomiting after your treatment, we encourage you to take your nausea medication as directed.   If you develop nausea and vomiting that is not controlled by your nausea medication, call the clinic.   BELOW ARE SYMPTOMS THAT SHOULD BE REPORTED IMMEDIATELY:  *FEVER GREATER THAN 100.5 F  *CHILLS WITH OR WITHOUT FEVER  NAUSEA AND VOMITING THAT IS NOT CONTROLLED WITH YOUR NAUSEA MEDICATION  *UNUSUAL SHORTNESS OF BREATH  *UNUSUAL BRUISING OR BLEEDING  TENDERNESS IN MOUTH AND THROAT WITH OR WITHOUT PRESENCE OF ULCERS  *URINARY PROBLEMS  *BOWEL PROBLEMS  UNUSUAL RASH Items with * indicate a potential emergency and should be followed up as soon as possible.  Feel free to call the clinic should you have any questions or concerns. The clinic phone number is [phone].  Please show the CHEMO ALERT CARD at check-in to the Emergency Department and triage nurse.   

## 2019-10-09 ENCOUNTER — Encounter: Payer: Self-pay | Admitting: Internal Medicine

## 2019-10-09 ENCOUNTER — Inpatient Hospital Stay: Payer: Medicare Other

## 2019-10-09 ENCOUNTER — Other Ambulatory Visit: Payer: Self-pay

## 2019-10-09 ENCOUNTER — Inpatient Hospital Stay (HOSPITAL_BASED_OUTPATIENT_CLINIC_OR_DEPARTMENT_OTHER): Payer: Medicare Other | Admitting: Medical

## 2019-10-09 VITALS — BP 121/68 | HR 62 | Temp 98.4°F | Resp 18

## 2019-10-09 DIAGNOSIS — C3491 Malignant neoplasm of unspecified part of right bronchus or lung: Secondary | ICD-10-CM

## 2019-10-09 DIAGNOSIS — Z5111 Encounter for antineoplastic chemotherapy: Secondary | ICD-10-CM | POA: Diagnosis not present

## 2019-10-09 MED ORDER — SODIUM CHLORIDE 0.9 % IV SOLN
10.0000 mg | Freq: Once | INTRAVENOUS | Status: AC
  Administered 2019-10-09: 10 mg via INTRAVENOUS
  Filled 2019-10-09: qty 10

## 2019-10-09 MED ORDER — SODIUM CHLORIDE 0.9 % IV SOLN
Freq: Once | INTRAVENOUS | Status: AC
  Filled 2019-10-09: qty 250

## 2019-10-09 MED ORDER — SODIUM CHLORIDE 0.9 % IV SOLN
100.0000 mg/m2 | Freq: Once | INTRAVENOUS | Status: AC
  Administered 2019-10-09: 220 mg via INTRAVENOUS
  Filled 2019-10-09: qty 11

## 2019-10-09 NOTE — Patient Instructions (Signed)
[facility] Discharge Instructions for Patients Receiving Chemotherapy  Today you received the following chemotherapy agents: etoposide  To help prevent nausea and vomiting after your treatment, we encourage you to take your nausea medication as directed.   If you develop nausea and vomiting that is not controlled by your nausea medication, call the clinic.   BELOW ARE SYMPTOMS THAT SHOULD BE REPORTED IMMEDIATELY:  *FEVER GREATER THAN 100.5 F  *CHILLS WITH OR WITHOUT FEVER  NAUSEA AND VOMITING THAT IS NOT CONTROLLED WITH YOUR NAUSEA MEDICATION  *UNUSUAL SHORTNESS OF BREATH  *UNUSUAL BRUISING OR BLEEDING  TENDERNESS IN MOUTH AND THROAT WITH OR WITHOUT PRESENCE OF ULCERS  *URINARY PROBLEMS  *BOWEL PROBLEMS  UNUSUAL RASH Items with * indicate a potential emergency and should be followed up as soon as possible.  Feel free to call the clinic should you have any questions or concerns. The clinic phone number is [phone].  Please show the CHEMO ALERT CARD at check-in to the Emergency Department and triage nurse.   

## 2019-10-09 NOTE — Progress Notes (Signed)
Met with patient and spouse at registration to obtain proof of income for grant.  Patient approved for the one-time $1000 [employer] grant to assist with personal expenses while going through treatment. Gave him a copy of the approval letter and expense sheet along with the Outpatient pharmacy information. Discussed in detail expenses and how they are covered. They received a gas card today from the grant.  They have my card for any additional financial questions or concerns.

## 2019-10-11 ENCOUNTER — Inpatient Hospital Stay: Payer: Medicare Other

## 2019-10-11 ENCOUNTER — Other Ambulatory Visit: Payer: Self-pay

## 2019-10-11 VITALS — BP 148/74 | HR 56 | Temp 98.7°F | Resp 18

## 2019-10-11 DIAGNOSIS — Z5111 Encounter for antineoplastic chemotherapy: Secondary | ICD-10-CM | POA: Diagnosis not present

## 2019-10-11 DIAGNOSIS — C3491 Malignant neoplasm of unspecified part of right bronchus or lung: Secondary | ICD-10-CM

## 2019-10-11 MED ORDER — PEGFILGRASTIM-CBQV 6 MG/0.6ML ~~LOC~~ SOSY
PREFILLED_SYRINGE | SUBCUTANEOUS | Status: AC
  Filled 2019-10-11: qty 0.6

## 2019-10-11 MED ORDER — PEGFILGRASTIM-CBQV 6 MG/0.6ML ~~LOC~~ SOSY
6.0000 mg | PREFILLED_SYRINGE | Freq: Once | SUBCUTANEOUS | Status: AC
  Administered 2019-10-11: 6 mg via SUBCUTANEOUS

## 2019-10-11 NOTE — Patient Instructions (Signed)

## 2019-10-11 NOTE — Progress Notes (Signed)
The patient was seen in the infusion room today as he was receiving cycle 1, day 3 of carboplatin and etoposide.  He had been given IV Decadron prior to reporting that he was feeling mildly dizzy.  He reported that he was feeling different than he had earlier this week with his other treatments.  His vital signs were stable with a blood pressure at 120/67, pulse at 62, temperature at 98.4, and oxygen saturation at 99% on room air.  He did not appear toxic.  His brief exam showed lungs that were clear to auscultation bilaterally without wheezes rales or rhonchi.  His cardiovascular exam showed regular rate and rhythm without murmurs rubs or gallops.  The patient was reassured with his symptoms eventually abating.  No interruption was made in his treatment today.  He completed his chemotherapy without any further issues of concern.  Sandi Mealy, MHS, PA-C Physician assistant

## 2019-10-12 NOTE — Progress Notes (Signed)
[facility] OFFICE PROGRESS NOTE  [name], MD [address] [place] [id number]  DIAGNOSIS: limited stage (T3, N2, M0) small cell lung cancer presented with pulmonary nodules and pleural-based nodules in the right lung in addition to mediastinal lymphadenopathy diagnosed in August 2021. The patient was previously treated without biopsy to a pleural-based nodule in the right lower lobe with SBRT in March 2021 but had evidence for disease recurrence and progression.  PRIOR THERAPY: SBRT to right lower lobe pulmonary nodule in March 2021 under the care of Dr. Sondra Come  CURRENT THERAPY: Systemic chemotherapy with carboplatin for AUC of 5 on day 1 and etoposide 100 mg/M2 on days 1, 2 and 3 with Neulasta support every 3 weeks.  First cycle 10/07/2019. Status post 1 cycle  INTERVAL HISTORY: Jonathon [name] 69 y.o. male returns to the clinic for a follow up visit accompanied by his wife. The patient is feeling fairly well today without any concerning complaints except for fatigue since receiving chemotherapy next week. The patient underwent his first cycle of systemic chemotherapy last week and tolerated well without any adverse side effects except for the fatigue and low exergy. Denies any fever, chills, night sweats, or weight loss. Denies any chest pain or hemoptysis. He reports his baseline cough. He reports dyspnea on exertion.  Denies any nausea, vomiting, diarrhea, or constipation. Denies any headache or visual changes. The patient is here today for evaluation and a 1 week follow up visit and to manage any adverse side effects of treatment.    MEDICAL HISTORY: Past Medical History:  Diagnosis Date  . CHF (congestive heart failure) ([place])   . Chronic cough   . COPD, severe ([place])    PT DENIES REGULAR DAILY INHALER ANY PRN  . Coronary artery disease   . CVA (cerebral vascular accident) ([place]) 02/22/2011   LEFT BRAIN STEM PONTINE HEMORRHAGE--  RIGHT SIDED  WEAKNESS  . Diabetes mellitus without complication ([place])    type 2  . Dyspnea   . GERD (gastroesophageal reflux disease)   . Harsh voice quality    PT STATES NORMAL FOR HIM  . High cholesterol   . History of CHF (congestive heart failure) MONITORED BY DR BOUSKA   DIASTOLIC  . Hydrocele, left   . Hypertension   . NSTEMI (non-ST elevated myocardial infarction) ([place]) 12/2015  . Pre-operative clearance    GIVEN BY DR Coletta Memos  . Short of breath on exertion   . Smoker   . Weakness of right side of body    S/P CVA   FEB 2013    ALLERGIES:  is allergic to atorvastatin.  MEDICATIONS:  Current Outpatient Medications  Medication Sig Dispense Refill  . albuterol (PROVENTIL) (2.5 MG/3ML) 0.083% nebulizer solution Take 2.5 mg by nebulization every 4 (four) hours as needed for wheezing or shortness of breath.    . Albuterol Sulfate (PROAIR RESPICLICK) 169 (90 BASE) MCG/ACT AEPB Inhale 2 puffs into the lungs 4 (four) times daily as needed. 1 each 5  . amLODipine (NORVASC) 10 MG tablet Take 10 mg by mouth every morning.     Marland Kitchen atorvastatin (LIPITOR) 20 MG tablet Take 20 mg by mouth daily.    . bisoprolol (ZEBETA) 5 MG tablet Take 1 tablet (5 mg total) by mouth daily. 30 tablet 0  . budesonide-formoterol (SYMBICORT) 160-4.5 MCG/ACT inhaler Inhale 2 puffs into the lungs 2 (two) times daily.    . cloNIDine (CATAPRES) 0.3 MG tablet Take 0.3 mg by  mouth 2 (two) times daily.    . Cyanocobalamin (VITAMIN B 12 PO) Take 1 tablet by mouth every morning.     . cyclobenzaprine (FLEXERIL) 5 MG tablet Take 5 mg by mouth at bedtime.     . dabigatran (PRADAXA) 150 MG CAPS capsule Take 150 mg by mouth 2 (two) times daily.    . DULoxetine (CYMBALTA) 30 MG capsule Take 30 mg by mouth every morning.     . gabapentin (NEURONTIN) 400 MG capsule Take 400 mg by mouth 3 (three) times daily.     Marland Kitchen losartan (COZAAR) 50 MG tablet Take 50 mg by mouth daily.    . metFORMIN (GLUCOPHAGE) 500 MG tablet Take 500 mg by mouth daily  with breakfast.    . nitroGLYCERIN (NITROSTAT) 0.4 MG SL tablet Place 0.4 mg under the tongue every 5 (five) minutes as needed for chest pain.    . nortriptyline (PAMELOR) 25 MG capsule Take 25 mg by mouth at bedtime.    . pantoprazole (PROTONIX) 20 MG tablet TAKE 1 TABLET BY MOUTH DAILY 90 tablet 2  . prochlorperazine (COMPAZINE) 10 MG tablet Take 1 tablet (10 mg total) by mouth every 6 (six) hours as needed for nausea or vomiting. 30 tablet 0  . tamsulosin (FLOMAX) 0.4 MG CAPS capsule Take 0.4 mg by mouth daily.    . Tiotropium Bromide Monohydrate (SPIRIVA RESPIMAT) 2.5 MCG/ACT AERS Inhale 2 puffs into the lungs daily. 4 g 5   No current facility-administered medications for this visit.    SURGICAL HISTORY:  Past Surgical History:  Procedure Laterality Date  . ABDOMINAL ANGIOGRAM  09/04/2012   Procedure: ABDOMINAL ANGIOGRAM;  Surgeon: Laverda Page, MD;  Location: [facility] CATH LAB;  Service: Cardiovascular;;  . CARDIAC CATHETERIZATION  05-02-2011  DR Johnsie Cancel   MILD NONOBSTRUCTIVE CAD/ LAD, OM , AND D1/ EF 60^  . CARDIAC CATHETERIZATION  AUG 2000   NON-OBSTRUTIVE CAD/ EF 68%/ 25% PROXIMAL LAD/ 20% MID CIRCUMFLEX  . CARDIAC CATHETERIZATION [name] 12/23/2015   Procedure: Left Heart Cath and Coronary Angiography;  Surgeon: Adrian Prows, MD;  Location: [facility] CV LAB;  Service: Cardiovascular;  Laterality: [name];  . CARDIAC CATHETERIZATION [name] 12/24/2015   Procedure: Coronary Stent Intervention;  Surgeon: Adrian Prows, MD;  Location: [facility] CV LAB;  Service: Cardiovascular;  Laterality: [name];  . CORONARY ANGIOPLASTY    . CORONARY STENT PLACEMENT  12/24/2015    PTCA and stenting of the distal RCA   . HYDROCELE EXCISION Left 04/24/2012   Procedure: HYDROCELECTOMY ADULT;  Surgeon: Fredricka Bonine, MD;  Location[facility];  Service: Urology;  Laterality: Left;  90 mins req for this case     . [place]  . LEFT AND RIGHT HEART CATHETERIZATION WITH CORONARY  ANGIOGRAM [name] 09/04/2012   Procedure: LEFT AND RIGHT HEART CATHETERIZATION WITH CORONARY ANGIOGRAM;  Surgeon: Laverda Page, MD;  Location: [facility] CATH LAB;  Service: Cardiovascular;  Laterality: [name];  . LEFT HEART CATHETERIZATION WITH CORONARY ANGIOGRAM [name] 05/02/2011   Procedure: LEFT HEART CATHETERIZATION WITH CORONARY ANGIOGRAM;  Surgeon: Josue Hector, MD;  Location: [facility] CATH LAB;  Service: Cardiovascular;  Laterality: [name];  . THORACOTOMY/LOBECTOMY Right 03-06-2008   AND STAPLING OF BLEBS FOR SPONTANOUS PNEUMOTHORAX  . TRANSTHORACIC ECHOCARDIOGRAM  02-17-2011   MODERATE LVH/ LVSF NORMAL/ EF 09-81%/ GRADE I DIASTOLIC DYSFUNCTION    REVIEW OF SYSTEMS:   Review of Systems  Constitutional: Positive for fatigue.  Negative for appetite change, chills, fever  and unexpected weight change.  HENT: Negative for mouth sores, nosebleeds, sore throat and trouble swallowing.   Eyes: Negative for eye problems and icterus.  Respiratory: Positive for mild baseline dyspnea on exertion and mild cough.  Negative for hemoptysis and wheezing.   Cardiovascular: Negative for chest pain and leg swelling.  Gastrointestinal: Negative for abdominal pain, constipation, diarrhea, nausea and vomiting.  Genitourinary: Negative for bladder incontinence, difficulty urinating, dysuria, frequency and hematuria.   Musculoskeletal: Negative for back pain, gait problem, neck pain and neck stiffness.  Skin: Negative for itching and rash.  Neurological: Negative for dizziness, extremity weakness, gait problem, headaches, light-headedness and seizures.  Hematological: Negative for adenopathy. Does not bruise/bleed easily.  Psychiatric/Behavioral: Negative for confusion, depression and sleep disturbance. The patient is not nervous/anxious.     PHYSICAL EXAMINATION:  Blood pressure 139/72, pulse 63, temperature 97.9 F (36.6 C), temperature source Tympanic, resp. rate 18, height 6' (1.829 m), weight 202 lb 1.6 oz (91.7 kg), SpO2  98 %.  ECOG PERFORMANCE STATUS: 1 - Symptomatic but completely ambulatory  Physical Exam  Constitutional: Oriented to person, place, and time and well-developed, well-nourished, and in no distress.  HENT:  Head: Normocephalic and atraumatic.  Mouth/Throat: Oropharynx is clear and moist. No oropharyngeal exudate.  Eyes: Conjunctivae are normal. Right eye exhibits no discharge. Left eye exhibits no discharge. No scleral icterus.  Neck: Normal range of motion. Neck supple.  Cardiovascular: Normal rate, regular rhythm, normal heart sounds and intact distal pulses.   Pulmonary/Chest: Effort normal and breath sounds normal. No respiratory distress. No wheezes. No rales.  Abdominal: Soft. Bowel sounds are normal. Exhibits no distension and no mass. There is no tenderness.  Musculoskeletal: Normal range of motion. Exhibits no edema.  Lymphadenopathy:    No cervical adenopathy.  Neurological: Alert and oriented to person, place, and time. Exhibits normal muscle tone. Gait normal. Coordination normal.  Ambulates with a cane. Skin: Skin is warm and dry. No rash noted. Not diaphoretic. No erythema. No pallor.  Psychiatric: Mood, memory and judgment normal.  Vitals reviewed.  LABORATORY DATA: Lab Results  Component Value Date   WBC 17.5 (H) 10/14/2019   HGB 11.9 (L) 10/14/2019   HCT 36.9 (L) 10/14/2019   MCV 80.9 10/14/2019   PLT 166 10/14/2019      Chemistry      Component Value Date/Time   NA 132 (L) 10/14/2019 1135   K 4.2 10/14/2019 1135   CL 95 (L) 10/14/2019 1135   CO2 34 (H) 10/14/2019 1135   BUN 12 10/14/2019 1135   CREATININE 1.01 10/14/2019 1135      Component Value Date/Time   CALCIUM 9.0 10/14/2019 1135   ALKPHOS 95 10/14/2019 1135   AST 10 (L) 10/14/2019 1135   ALT 12 10/14/2019 1135   BILITOT 0.9 10/14/2019 1135       RADIOGRAPHIC STUDIES:  MR BRAIN W WO CONTRAST  Result Date: 10/04/2019 CLINICAL DATA:  Staging of small cell lung cancer. EXAM: MRI HEAD WITHOUT  AND WITH CONTRAST TECHNIQUE: Multiplanar, multiecho pulse sequences of the brain and surrounding structures were obtained without and with intravenous contrast. CONTRAST:  54mL GADAVIST GADOBUTROL 1 MMOL/ML IV SOLN COMPARISON:  Head CT 08/29/2011.  MRI 02/18/2011. FINDINGS: Brain: Diffusion imaging does not show any acute or subacute infarction or other cause of restricted diffusion. There is an old hemorrhagic infarction of the left pons and midbrain with hemosiderin deposition. Ordinary chronic small-vessel changes affect the pons elsewhere. No cerebellar abnormality. Cerebral hemispheres show old  small vessel infarctions of the thalami and basal ganglia. Chronic small-vessel ischemic changes are present affecting the deep and subcortical white matter. Old left frontal cortical and subcortical infarction. No evidence of primary or metastatic mass lesion, hemorrhage, hydrocephalus or extra-axial collection. No abnormal leptomeningeal enhancement otherwise. Vascular: Major vessels at the base of the brain show flow. Skull and upper cervical spine: Negative Sinuses/Orbits: Clear/normal Other: None IMPRESSION: 1. No evidence of metastatic disease. 2. Old hemorrhagic infarction of the left pons and midbrain with hemosiderin deposition. 3. Extensive chronic small-vessel ischemic changes throughout the brain as outlined above. Old left frontal cortical and subcortical infarction Electronically Signed   By: Nelson Chimes [name]   On: 10/04/2019 08:28     ASSESSMENT/PLAN:  This is a very pleasant 69 year old male recently diagnosed with limited stage small cell lung cancer (T3, N2, M0) presented with pleural-based pulmonary nodules in the right lung in addition to mediastinal lymphadenopathy in August 2021.  The patient was initially diagnosed with a pleural-based nodule status post SBRT in March 2021.  He is currently undergoing systemic chemotherapy with carboplatin for AUC of 5 from day 1 and etoposide 100 mg/M2 on  days 1, 2 and 3 with Neulasta support every 3 weeks. He is status post 1 cycle and tolerated it well except for the fatigue.  The patient was seen with Dr. Julien Nordmann. labs were reviewed. Recommend that he proceed on the same treatment on the same dose  We will see him back for a follow up visit for evaluation before starting cycle #2 in 2 weeks.   The patient was advised to call immediately if he has any concerning symptoms in the interval. The patient voices understanding of current disease status and treatment options and is in agreement with the current care plan. All questions were answered. The patient knows to call the clinic with any problems, questions or concerns. We can certainly see the patient much sooner if necessary   No orders of the defined types were placed in this encounter.    Briea Mcenery L Libni Fusaro, PA-C 10/14/19  ADDENDUM: Hematology/Oncology Attending: I had a face-to-face encounter with the patient today.  I recommended his care plan.  This is a very pleasant 69 years old white male with limited stage small cell lung cancer diagnosed in August 2021.  The patient started the first cycle of systemic chemotherapy with carboplatin and etoposide with Neulasta support last week. He tolerated the first cycle of his treatment well except for fatigue especially after the Neulasta injection.  He took Tylenol and Claritin with some improvement. I recommended for the patient to continue his treatment as planned and he is expected to start cycle #2 in 2 weeks. The patient will come back for follow-up visit at that time. He was advised to call immediately if he has any concerning symptoms in the interval.  Disclaimer: This note was dictated with voice recognition software. Similar sounding words can inadvertently be transcribed and may be missed upon review. Eilleen Kempf, MD 10/14/19

## 2019-10-14 ENCOUNTER — Inpatient Hospital Stay: Payer: Medicare Other

## 2019-10-14 ENCOUNTER — Inpatient Hospital Stay (HOSPITAL_BASED_OUTPATIENT_CLINIC_OR_DEPARTMENT_OTHER): Payer: Medicare Other | Admitting: Physician Assistant

## 2019-10-14 ENCOUNTER — Other Ambulatory Visit: Payer: Self-pay

## 2019-10-14 ENCOUNTER — Encounter: Payer: Self-pay | Admitting: Physician Assistant

## 2019-10-14 VITALS — BP 139/72 | HR 63 | Temp 97.9°F | Resp 18 | Ht 72.0 in | Wt 202.1 lb

## 2019-10-14 DIAGNOSIS — C3491 Malignant neoplasm of unspecified part of right bronchus or lung: Secondary | ICD-10-CM

## 2019-10-14 DIAGNOSIS — Z5111 Encounter for antineoplastic chemotherapy: Secondary | ICD-10-CM | POA: Diagnosis not present

## 2019-10-14 LAB — CMP (CANCER CENTER ONLY)
ALT: 12 U/L (ref 0–44)
AST: 10 U/L — ABNORMAL LOW (ref 15–41)
Albumin: 3.3 g/dL — ABNORMAL LOW (ref 3.5–5.0)
Alkaline Phosphatase: 95 U/L (ref 38–126)
Anion gap: 3 — ABNORMAL LOW (ref 5–15)
BUN: 12 mg/dL (ref 8–23)
CO2: 34 mmol/L — ABNORMAL HIGH (ref 22–32)
Calcium: 9 mg/dL (ref 8.9–10.3)
Chloride: 95 mmol/L — ABNORMAL LOW (ref 98–111)
Creatinine: 1.01 mg/dL (ref 0.61–1.24)
GFR, Est AFR Am: >60 mL/min (ref >60)
GFR, Estimated: >60 mL/min (ref >60)
Glucose, Bld: 122 mg/dL — ABNORMAL HIGH (ref 70–99)
Potassium: 4.2 mmol/L (ref 3.5–5.1)
Sodium: 132 mmol/L — ABNORMAL LOW (ref 135–145)
Total Bilirubin: 0.9 mg/dL (ref 0.3–1.2)
Total Protein: 7.1 g/dL (ref 6.5–8.1)

## 2019-10-14 LAB — CBC WITH DIFFERENTIAL (CANCER CENTER ONLY)
Abs Immature Granulocytes: 0 10*3/uL (ref 0.00–0.07)
Basophils Absolute: 0.2 10*3/uL — ABNORMAL HIGH (ref 0.0–0.1)
Basophils Relative: 1 %
Eosinophils Absolute: 0.2 10*3/uL (ref 0.0–0.5)
Eosinophils Relative: 1 %
HCT: 36.9 % — ABNORMAL LOW (ref 39.0–52.0)
Hemoglobin: 11.9 g/dL — ABNORMAL LOW (ref 13.0–17.0)
Lymphocytes Relative: 11 %
Lymphs Abs: 1.9 10*3/uL (ref 0.7–4.0)
MCH: 26.1 pg (ref 26.0–34.0)
MCHC: 32.2 g/dL (ref 30.0–36.0)
MCV: 80.9 fL (ref 80.0–100.0)
Monocytes Absolute: 0.2 10*3/uL (ref 0.1–1.0)
Monocytes Relative: 1 %
Neutro Abs: 15.1 10*3/uL — ABNORMAL HIGH (ref 1.7–7.7)
Neutrophils Relative %: 86 %
Platelet Count: 166 10*3/uL (ref 150–400)
RBC: 4.56 MIL/uL (ref 4.22–5.81)
RDW: 16.3 % — ABNORMAL HIGH (ref 11.5–15.5)
WBC Count: 17.5 10*3/uL — ABNORMAL HIGH (ref 4.0–10.5)
nRBC: 0 % (ref 0.0–0.2)

## 2019-10-17 ENCOUNTER — Telehealth: Payer: Self-pay | Admitting: Internal Medicine

## 2019-10-17 NOTE — Telephone Encounter (Signed)
Change time of 10/11 appt due to provider being on [place]. Called and left msg about change

## 2019-10-21 ENCOUNTER — Other Ambulatory Visit: Payer: Self-pay

## 2019-10-21 ENCOUNTER — Inpatient Hospital Stay: Payer: Medicare Other | Attending: Physician Assistant

## 2019-10-21 DIAGNOSIS — C7931 Secondary malignant neoplasm of brain: Secondary | ICD-10-CM | POA: Insufficient documentation

## 2019-10-21 DIAGNOSIS — Z5111 Encounter for antineoplastic chemotherapy: Secondary | ICD-10-CM | POA: Diagnosis present

## 2019-10-21 DIAGNOSIS — J449 Chronic obstructive pulmonary disease, unspecified: Secondary | ICD-10-CM | POA: Insufficient documentation

## 2019-10-21 DIAGNOSIS — R053 Chronic cough: Secondary | ICD-10-CM | POA: Diagnosis not present

## 2019-10-21 DIAGNOSIS — I2510 Atherosclerotic heart disease of native coronary artery without angina pectoris: Secondary | ICD-10-CM | POA: Diagnosis not present

## 2019-10-21 DIAGNOSIS — Z8673 Personal history of transient ischemic attack (TIA), and cerebral infarction without residual deficits: Secondary | ICD-10-CM | POA: Insufficient documentation

## 2019-10-21 DIAGNOSIS — K219 Gastro-esophageal reflux disease without esophagitis: Secondary | ICD-10-CM | POA: Diagnosis not present

## 2019-10-21 DIAGNOSIS — Z79899 Other long term (current) drug therapy: Secondary | ICD-10-CM | POA: Insufficient documentation

## 2019-10-21 DIAGNOSIS — R112 Nausea with vomiting, unspecified: Secondary | ICD-10-CM | POA: Insufficient documentation

## 2019-10-21 DIAGNOSIS — E7800 Pure hypercholesterolemia, unspecified: Secondary | ICD-10-CM | POA: Insufficient documentation

## 2019-10-21 DIAGNOSIS — C3491 Malignant neoplasm of unspecified part of right bronchus or lung: Secondary | ICD-10-CM

## 2019-10-21 DIAGNOSIS — C3490 Malignant neoplasm of unspecified part of unspecified bronchus or lung: Secondary | ICD-10-CM | POA: Insufficient documentation

## 2019-10-21 DIAGNOSIS — E119 Type 2 diabetes mellitus without complications: Secondary | ICD-10-CM | POA: Diagnosis not present

## 2019-10-21 DIAGNOSIS — I10 Essential (primary) hypertension: Secondary | ICD-10-CM | POA: Diagnosis not present

## 2019-10-21 LAB — CMP (CANCER CENTER ONLY)
ALT: 16 U/L (ref 0–44)
AST: 13 U/L — ABNORMAL LOW (ref 15–41)
Albumin: 3.4 g/dL — ABNORMAL LOW (ref 3.5–5.0)
Alkaline Phosphatase: 118 U/L (ref 38–126)
Anion gap: 7 (ref 5–15)
BUN: 7 mg/dL — ABNORMAL LOW (ref 8–23)
CO2: 32 mmol/L (ref 22–32)
Calcium: 9.5 mg/dL (ref 8.9–10.3)
Chloride: 95 mmol/L — ABNORMAL LOW (ref 98–111)
Creatinine: 1.06 mg/dL (ref 0.61–1.24)
GFR, Est AFR Am: >60 mL/min (ref >60)
GFR, Estimated: >60 mL/min (ref >60)
Glucose, Bld: 141 mg/dL — ABNORMAL HIGH (ref 70–99)
Potassium: 3.9 mmol/L (ref 3.5–5.1)
Sodium: 134 mmol/L — ABNORMAL LOW (ref 135–145)
Total Bilirubin: 0.2 mg/dL — ABNORMAL LOW (ref 0.3–1.2)
Total Protein: 7.7 g/dL (ref 6.5–8.1)

## 2019-10-21 LAB — CBC WITH DIFFERENTIAL (CANCER CENTER ONLY)
Abs Immature Granulocytes: 1.36 10*3/uL — ABNORMAL HIGH (ref 0.00–0.07)
Basophils Absolute: 0.1 10*3/uL (ref 0.0–0.1)
Basophils Relative: 0 %
Eosinophils Absolute: 0 10*3/uL (ref 0.0–0.5)
Eosinophils Relative: 0 %
HCT: 37.9 % — ABNORMAL LOW (ref 39.0–52.0)
Hemoglobin: 12.2 g/dL — ABNORMAL LOW (ref 13.0–17.0)
Immature Granulocytes: 9 %
Lymphocytes Relative: 18 %
Lymphs Abs: 2.6 10*3/uL (ref 0.7–4.0)
MCH: 25.8 pg — ABNORMAL LOW (ref 26.0–34.0)
MCHC: 32.2 g/dL (ref 30.0–36.0)
MCV: 80.3 fL (ref 80.0–100.0)
Monocytes Absolute: 1.4 10*3/uL — ABNORMAL HIGH (ref 0.1–1.0)
Monocytes Relative: 10 %
Neutro Abs: 9.4 10*3/uL — ABNORMAL HIGH (ref 1.7–7.7)
Neutrophils Relative %: 63 %
Platelet Count: 144 10*3/uL — ABNORMAL LOW (ref 150–400)
RBC: 4.72 MIL/uL (ref 4.22–5.81)
RDW: 18.3 % — ABNORMAL HIGH (ref 11.5–15.5)
WBC Count: 14.8 10*3/uL — ABNORMAL HIGH (ref 4.0–10.5)
nRBC: 0.3 % — ABNORMAL HIGH (ref 0.0–0.2)

## 2019-10-25 NOTE — Progress Notes (Signed)
[facility] OFFICE PROGRESS NOTE  [name], MD [address] [place] [address]  DIAGNOSIS:  Limited stage (T3, N2, M0) small cell lung cancer presented with pulmonary nodules and pleural-based nodules in the right lung in addition to mediastinal lymphadenopathy diagnosed in August 2021. The patient was previously treated without biopsy to a pleural-based nodule in the right lower lobe with SBRT in March 2021 but had evidence for disease recurrence and progression.  PRIOR THERAPY: SBRT to right lower lobe pulmonary nodule in March 2021 under the care of Dr. Sondra Come  CURRENT THERAPY: Systemic chemotherapy with carboplatin for AUC of 5 on day 1 and etoposide 100 mg/M2 on days 1, 2 and 3 with Neulasta support every 3 weeks. First cycle 10/07/2019. Status post 1 cycle  INTERVAL HISTORY: Opal A Riemann 69 y.o. male returns to the clinic for a follow up visit accompanied by his wife. The patient is feeling fairly well today without any concerning complaints except for he has had a headache recently and is concerned about metastatic disease to the brain. He has been taking tylenol. He recently had his initial brain MRI on 10/03/19 which was negative for metastatic disease to the brain.  The patient is status post his first cycle of systemic chemotherapy and he  tolerated well without any adverse side effects except for the fatigue and low exergy which occurred for about 2-3 days starting a couple days after treatment. Otherwise, he notes his hair is starting to fall out so he shaved his head. Denies any fever, chills, night sweats, or weight loss. The patient is eating well and has a strong appetite. Denies any chest pain or hemoptysis. He reports his baseline cough. He reports dyspnea on exertion.  Denies any diarrhea or constipation. He had some nausea/vomiting x2 a few days ago. The patient is here today for evaluation before starting cycle #2.   MEDICAL  HISTORY: Past Medical History:  Diagnosis Date  . CHF (congestive heart failure) ([place])   . Chronic cough   . COPD, severe ([place])    PT DENIES REGULAR DAILY INHALER ANY PRN  . Coronary artery disease   . CVA (cerebral vascular accident) ([place]) 02/22/2011   LEFT BRAIN STEM PONTINE HEMORRHAGE--  RIGHT SIDED WEAKNESS  . Diabetes mellitus without complication ([place])    type 2  . Dyspnea   . GERD (gastroesophageal reflux disease)   . Harsh voice quality    PT STATES NORMAL FOR HIM  . High cholesterol   . History of CHF (congestive heart failure) MONITORED BY DR BOUSKA   DIASTOLIC  . Hydrocele, left   . Hypertension   . NSTEMI (non-ST elevated myocardial infarction) ([place]) 12/2015  . Pre-operative clearance    GIVEN BY DR Coletta Memos  . Short of breath on exertion   . Smoker   . Weakness of right side of body    S/P CVA   FEB 2013    ALLERGIES:  is allergic to atorvastatin.  MEDICATIONS:  Current Outpatient Medications  Medication Sig Dispense Refill  . albuterol (PROVENTIL) (2.5 MG/3ML) 0.083% nebulizer solution Take 2.5 mg by nebulization every 4 (four) hours as needed for wheezing or shortness of breath.    . Albuterol Sulfate (PROAIR RESPICLICK) 376 (90 BASE) MCG/ACT AEPB Inhale 2 puffs into the lungs 4 (four) times daily as needed. 1 each 5  . amLODipine (NORVASC) 10 MG tablet Take 10 mg by mouth every morning.     Marland Kitchen atorvastatin (LIPITOR)  20 MG tablet Take 20 mg by mouth daily.    . bisoprolol (ZEBETA) 5 MG tablet Take 1 tablet (5 mg total) by mouth daily. 30 tablet 0  . budesonide-formoterol (SYMBICORT) 160-4.5 MCG/ACT inhaler Inhale 2 puffs into the lungs 2 (two) times daily.    . cloNIDine (CATAPRES) 0.3 MG tablet Take 0.3 mg by mouth 2 (two) times daily.    . Cyanocobalamin (VITAMIN B 12 PO) Take 1 tablet by mouth every morning.     . cyclobenzaprine (FLEXERIL) 5 MG tablet Take 5 mg by mouth at bedtime.     . dabigatran (PRADAXA) 150 MG CAPS capsule Take 150 mg by mouth 2 (two)  times daily.    . DULoxetine (CYMBALTA) 30 MG capsule Take 30 mg by mouth every morning.     . gabapentin (NEURONTIN) 400 MG capsule Take 400 mg by mouth 3 (three) times daily.     Marland Kitchen losartan (COZAAR) 50 MG tablet Take 50 mg by mouth daily.    . metFORMIN (GLUCOPHAGE) 500 MG tablet Take 500 mg by mouth daily with breakfast.    . nitroGLYCERIN (NITROSTAT) 0.4 MG SL tablet Place 0.4 mg under the tongue every 5 (five) minutes as needed for chest pain.    . nortriptyline (PAMELOR) 25 MG capsule Take 25 mg by mouth at bedtime.    . pantoprazole (PROTONIX) 20 MG tablet TAKE 1 TABLET BY MOUTH DAILY 90 tablet 2  . prochlorperazine (COMPAZINE) 10 MG tablet Take 1 tablet (10 mg total) by mouth every 6 (six) hours as needed for nausea or vomiting. 30 tablet 0  . tamsulosin (FLOMAX) 0.4 MG CAPS capsule Take 0.4 mg by mouth daily.    . Tiotropium Bromide Monohydrate (SPIRIVA RESPIMAT) 2.5 MCG/ACT AERS Inhale 2 puffs into the lungs daily. 4 g 5   No current facility-administered medications for this visit.    SURGICAL HISTORY:  Past Surgical History:  Procedure Laterality Date  . ABDOMINAL ANGIOGRAM  09/04/2012   Procedure: ABDOMINAL ANGIOGRAM;  Surgeon: Laverda Page, MD;  Location: [facility] CATH LAB;  Service: Cardiovascular;;  . CARDIAC CATHETERIZATION  05-02-2011  DR Johnsie Cancel   MILD NONOBSTRUCTIVE CAD/ LAD, OM , AND D1/ EF 60^  . CARDIAC CATHETERIZATION  AUG 2000   NON-OBSTRUTIVE CAD/ EF 68%/ 25% PROXIMAL LAD/ 20% MID CIRCUMFLEX  . CARDIAC CATHETERIZATION [name] 12/23/2015   Procedure: Left Heart Cath and Coronary Angiography;  Surgeon: Adrian Prows, MD;  Location: [facility] CV LAB;  Service: Cardiovascular;  Laterality: [name];  . CARDIAC CATHETERIZATION [name] 12/24/2015   Procedure: Coronary Stent Intervention;  Surgeon: Adrian Prows, MD;  Location: [facility] CV LAB;  Service: Cardiovascular;  Laterality: [name];  . CORONARY ANGIOPLASTY    . CORONARY STENT PLACEMENT  12/24/2015    PTCA and stenting of the distal  RCA   . HYDROCELE EXCISION Left 04/24/2012   Procedure: HYDROCELECTOMY ADULT;  Surgeon: Fredricka Bonine, MD;  Location: [facility];  Service: Urology;  Laterality: Left;  90 mins req for this case     . [place]  . LEFT AND RIGHT HEART CATHETERIZATION WITH CORONARY ANGIOGRAM [name] 09/04/2012   Procedure: LEFT AND RIGHT HEART CATHETERIZATION WITH CORONARY ANGIOGRAM;  Surgeon: Laverda Page, MD;  Location: [facility] CATH LAB;  Service: Cardiovascular;  Laterality: [name];  . LEFT HEART CATHETERIZATION WITH CORONARY ANGIOGRAM [name] 05/02/2011   Procedure: LEFT HEART CATHETERIZATION WITH CORONARY ANGIOGRAM;  Surgeon: Josue Hector, MD;  Location: [facility] CATH LAB;  Service: Cardiovascular;  Laterality: [name];  . THORACOTOMY/LOBECTOMY Right 03-06-2008   AND STAPLING OF BLEBS FOR SPONTANOUS PNEUMOTHORAX  . TRANSTHORACIC ECHOCARDIOGRAM  02-17-2011   MODERATE LVH/ LVSF NORMAL/ EF 30-86%/ GRADE I DIASTOLIC DYSFUNCTION    REVIEW OF SYSTEMS:   Review of Systems  Constitutional: Positive for fatigue following chemo (improved). Negative for appetite change, chills,  fever and unexpected weight change.  HENT: Negative for mouth sores, nosebleeds, sore throat and trouble swallowing.   Eyes: Negative for eye problems and icterus.  Respiratory: Positive for mild baseline dyspnea on exertion and mild cough. Negative for hemoptysis and wheezing.   Cardiovascular: Negative for chest pain and leg swelling.  Gastrointestinal: Positive for nausea and vomiting x2 2 days ago. Negative for abdominal pain, constipation, and diarrhea. Genitourinary: Negative for bladder incontinence, difficulty urinating, dysuria, frequency and hematuria.   Musculoskeletal: Negative for back pain, gait problem, neck pain and neck stiffness.  Skin: Negative for itching and rash.  Neurological: Positive for headache. Negative for dizziness, extremity weakness, gait problem,  light-headedness and seizures.   Hematological: Negative for adenopathy. Does not bruise/bleed easily.  Psychiatric/Behavioral: Negative for confusion, depression and sleep disturbance. The patient is not nervous/anxious.     PHYSICAL EXAMINATION:  Blood pressure 131/70, pulse (!) 57, temperature 98.6 F (37 C), resp. rate 12, height 6' (1.829 m), weight 201 lb (91.2 kg), SpO2 98 %.  ECOG PERFORMANCE STATUS: 1 - Symptomatic but completely ambulatory  Physical Exam  Constitutional: Oriented to person, place, and time and well-developed, well-nourished, and in no distress.  HENT:  Head: Normocephalic and atraumatic.  Mouth/Throat: Oropharynx is clear and moist. No oropharyngeal exudate.  Eyes: Conjunctivae are normal. Right eye exhibits no discharge. Left eye exhibits no discharge. No scleral icterus.  Neck: Normal range of motion. Neck supple.  Cardiovascular: Normal rate, regular rhythm, normal heart sounds and intact distal pulses.   Pulmonary/Chest: Effort normal and breath sounds normal. No respiratory distress. No wheezes. No rales.  Abdominal: Soft. Bowel sounds are normal. Exhibits no distension and no mass. There is no tenderness.  Musculoskeletal: Normal range of motion. Exhibits no edema.  Lymphadenopathy:    No cervical adenopathy.  Neurological: Alert and oriented to person, place, and time. Exhibits normal muscle tone. Ambulates with a cane.  Skin: Skin is warm and dry. No rash noted. Not diaphoretic. No erythema. No pallor.  Psychiatric: Mood, memory and judgment normal.  Vitals reviewed.  LABORATORY DATA: Lab Results  Component Value Date   WBC 8.8 10/29/2019   HGB 12.8 (L) 10/29/2019   HCT 39.5 10/29/2019   MCV 80.6 10/29/2019   PLT 396 10/29/2019      Chemistry      Component Value Date/Time   NA 135 10/29/2019 1255   K 3.5 10/29/2019 1255   CL 98 10/29/2019 1255   CO2 30 10/29/2019 1255   BUN 7 (L) 10/29/2019 1255   CREATININE 0.91 10/29/2019 1255      Component Value Date/Time    CALCIUM 9.6 10/29/2019 1255   ALKPHOS 92 10/29/2019 1255   AST 10 (L) 10/29/2019 1255   ALT 12 10/29/2019 1255   BILITOT <0.2 (L) 10/29/2019 1255       RADIOGRAPHIC STUDIES:  MR BRAIN W WO CONTRAST  Result Date: 10/04/2019 CLINICAL DATA:  Staging of small cell lung cancer. EXAM: MRI HEAD WITHOUT AND WITH CONTRAST TECHNIQUE: Multiplanar, multiecho pulse sequences of the brain and surrounding structures were obtained without and with intravenous contrast. CONTRAST:  51mL GADAVIST GADOBUTROL  1 MMOL/ML IV SOLN COMPARISON:  Head CT 08/29/2011.  MRI 02/18/2011. FINDINGS: Brain: Diffusion imaging does not show any acute or subacute infarction or other cause of restricted diffusion. There is an old hemorrhagic infarction of the left pons and midbrain with hemosiderin deposition. Ordinary chronic small-vessel changes affect the pons elsewhere. No cerebellar abnormality. Cerebral hemispheres show old small vessel infarctions of the thalami and basal ganglia. Chronic small-vessel ischemic changes are present affecting the deep and subcortical white matter. Old left frontal cortical and subcortical infarction. No evidence of primary or metastatic mass lesion, hemorrhage, hydrocephalus or extra-axial collection. No abnormal leptomeningeal enhancement otherwise. Vascular: Major vessels at the base of the brain show flow. Skull and upper cervical spine: Negative Sinuses/Orbits: Clear/normal Other: None IMPRESSION: 1. No evidence of metastatic disease. 2. Old hemorrhagic infarction of the left pons and midbrain with hemosiderin deposition. 3. Extensive chronic small-vessel ischemic changes throughout the brain as outlined above. Old left frontal cortical and subcortical infarction Electronically Signed   By: Nelson Chimes [name]   On: 10/04/2019 08:28     ASSESSMENT/PLAN:  This is a very pleasant 69 year old male recently diagnosed with limited stage small cell lung cancer (T3, N2, M0) presented with pleural-based  pulmonary nodules in the right lung in addition to mediastinal lymphadenopathy in August 2021. The patient was initially diagnosed with a pleural-based nodule status post SBRT in March 2021.  He is currently undergoing systemic chemotherapy with carboplatin for AUC of 5 from day 1 and etoposide 100 mg/M2 on days 1, 2 and 3 with Neulasta support every 3 weeks. He is status post 1 cycle and tolerated it well except for the fatigue.  Labs were reviewed. Recommend that he proceed on the same treatment on the same dose.   We will see him back for a follow up visit for evaluation before starting cycle #3 in 3 weeks.   I will confirm with Dr. Julien Nordmann tomorrow whether he needs a restaging CT scan of the chest before his next cycle of treatment. If so, I will call the patient tomorrow and let him know.   I instructed the patient to take claritin for a few days following his neulasta injection.   Regarding his headaches, I reassured the patient that it would be unusual for the patient to have metastatic disease to the brain at this point in time with his recent negative brain MRI. He will continue to use tylenol if needed.   The patient was advised to call immediately if he has any concerning symptoms in the interval. The patient voices understanding of current disease status and treatment options and is in agreement with the current care plan. All questions were answered. The patient knows to call the clinic with any problems, questions or concerns. We can certainly see the patient much sooner if necessary     No orders of the defined types were placed in this encounter.    Stevens Magwood L Demondre Aguas, PA-C 10/29/19

## 2019-10-28 ENCOUNTER — Other Ambulatory Visit: Payer: Self-pay | Admitting: Internal Medicine

## 2019-10-29 ENCOUNTER — Other Ambulatory Visit: Payer: Medicare Other

## 2019-10-29 ENCOUNTER — Inpatient Hospital Stay: Payer: Medicare Other

## 2019-10-29 ENCOUNTER — Inpatient Hospital Stay (HOSPITAL_BASED_OUTPATIENT_CLINIC_OR_DEPARTMENT_OTHER): Payer: Medicare Other | Admitting: Physician Assistant

## 2019-10-29 ENCOUNTER — Other Ambulatory Visit: Payer: Self-pay

## 2019-10-29 ENCOUNTER — Encounter: Payer: Self-pay | Admitting: Physician Assistant

## 2019-10-29 ENCOUNTER — Ambulatory Visit: Payer: Medicare Other | Admitting: Internal Medicine

## 2019-10-29 VITALS — BP 131/70 | HR 57 | Temp 98.6°F | Resp 12 | Ht 72.0 in | Wt 201.0 lb

## 2019-10-29 DIAGNOSIS — Z5111 Encounter for antineoplastic chemotherapy: Secondary | ICD-10-CM | POA: Diagnosis not present

## 2019-10-29 DIAGNOSIS — C3491 Malignant neoplasm of unspecified part of right bronchus or lung: Secondary | ICD-10-CM

## 2019-10-29 LAB — CMP (CANCER CENTER ONLY)
ALT: 12 U/L (ref 0–44)
AST: 10 U/L — ABNORMAL LOW (ref 15–41)
Albumin: 3.3 g/dL — ABNORMAL LOW (ref 3.5–5.0)
Alkaline Phosphatase: 92 U/L (ref 38–126)
Anion gap: 7 (ref 5–15)
BUN: 7 mg/dL — ABNORMAL LOW (ref 8–23)
CO2: 30 mmol/L (ref 22–32)
Calcium: 9.6 mg/dL (ref 8.9–10.3)
Chloride: 98 mmol/L (ref 98–111)
Creatinine: 0.91 mg/dL (ref 0.61–1.24)
GFR, Estimated: >60 mL/min (ref >60)
Glucose, Bld: 133 mg/dL — ABNORMAL HIGH (ref 70–99)
Potassium: 3.5 mmol/L (ref 3.5–5.1)
Sodium: 135 mmol/L (ref 135–145)
Total Bilirubin: <0.2 mg/dL — ABNORMAL LOW (ref 0.3–1.2)
Total Protein: 7.7 g/dL (ref 6.5–8.1)

## 2019-10-29 LAB — CBC WITH DIFFERENTIAL (CANCER CENTER ONLY)
Abs Immature Granulocytes: 0.06 10*3/uL (ref 0.00–0.07)
Basophils Absolute: 0.1 10*3/uL (ref 0.0–0.1)
Basophils Relative: 1 %
Eosinophils Absolute: 0 10*3/uL (ref 0.0–0.5)
Eosinophils Relative: 0 %
HCT: 39.5 % (ref 39.0–52.0)
Hemoglobin: 12.8 g/dL — ABNORMAL LOW (ref 13.0–17.0)
Immature Granulocytes: 1 %
Lymphocytes Relative: 20 %
Lymphs Abs: 1.8 10*3/uL (ref 0.7–4.0)
MCH: 26.1 pg (ref 26.0–34.0)
MCHC: 32.4 g/dL (ref 30.0–36.0)
MCV: 80.6 fL (ref 80.0–100.0)
Monocytes Absolute: 1.1 10*3/uL — ABNORMAL HIGH (ref 0.1–1.0)
Monocytes Relative: 12 %
Neutro Abs: 5.8 10*3/uL (ref 1.7–7.7)
Neutrophils Relative %: 66 %
Platelet Count: 396 10*3/uL (ref 150–400)
RBC: 4.9 MIL/uL (ref 4.22–5.81)
RDW: 18.3 % — ABNORMAL HIGH (ref 11.5–15.5)
WBC Count: 8.8 10*3/uL (ref 4.0–10.5)
nRBC: 0 % (ref 0.0–0.2)

## 2019-10-29 MED ORDER — SODIUM CHLORIDE 0.9 % IV SOLN
Freq: Once | INTRAVENOUS | Status: AC
  Filled 2019-10-29: qty 250

## 2019-10-29 MED ORDER — SODIUM CHLORIDE 0.9 % IV SOLN
547.5000 mg | Freq: Once | INTRAVENOUS | Status: AC
  Administered 2019-10-29: 550 mg via INTRAVENOUS
  Filled 2019-10-29: qty 55

## 2019-10-29 MED ORDER — PALONOSETRON HCL INJECTION 0.25 MG/5ML
0.2500 mg | Freq: Once | INTRAVENOUS | Status: AC
  Administered 2019-10-29: 0.25 mg via INTRAVENOUS

## 2019-10-29 MED ORDER — SODIUM CHLORIDE 0.9 % IV SOLN
150.0000 mg | Freq: Once | INTRAVENOUS | Status: AC
  Administered 2019-10-29: 150 mg via INTRAVENOUS
  Filled 2019-10-29: qty 150

## 2019-10-29 MED ORDER — PALONOSETRON HCL INJECTION 0.25 MG/5ML
INTRAVENOUS | Status: AC
  Filled 2019-10-29: qty 5

## 2019-10-29 MED ORDER — SODIUM CHLORIDE 0.9 % IV SOLN
10.0000 mg | Freq: Once | INTRAVENOUS | Status: AC
  Administered 2019-10-29: 10 mg via INTRAVENOUS
  Filled 2019-10-29: qty 10

## 2019-10-29 MED ORDER — SODIUM CHLORIDE 0.9 % IV SOLN
100.0000 mg/m2 | Freq: Once | INTRAVENOUS | Status: AC
  Administered 2019-10-29: 220 mg via INTRAVENOUS
  Filled 2019-10-29: qty 11

## 2019-10-29 NOTE — Patient Instructions (Signed)
[facility] Discharge Instructions for Patients Receiving Chemotherapy  Today you received the following chemotherapy agents: carboplatin, etoposide  To help prevent nausea and vomiting after your treatment, we encourage you to take your nausea medication as directed.    If you develop nausea and vomiting that is not controlled by your nausea medication, call the clinic.   BELOW ARE SYMPTOMS THAT SHOULD BE REPORTED IMMEDIATELY:  *FEVER GREATER THAN 100.5 F  *CHILLS WITH OR WITHOUT FEVER  NAUSEA AND VOMITING THAT IS NOT CONTROLLED WITH YOUR NAUSEA MEDICATION  *UNUSUAL SHORTNESS OF BREATH  *UNUSUAL BRUISING OR BLEEDING  TENDERNESS IN MOUTH AND THROAT WITH OR WITHOUT PRESENCE OF ULCERS  *URINARY PROBLEMS  *BOWEL PROBLEMS  UNUSUAL RASH Items with * indicate a potential emergency and should be followed up as soon as possible.  Feel free to call the clinic should you have any questions or concerns. The clinic phone number is [phone].  Please show the [place] at check-in to the Emergency Department and triage nurse.

## 2019-10-30 ENCOUNTER — Other Ambulatory Visit: Payer: Self-pay | Admitting: Physician Assistant

## 2019-10-30 ENCOUNTER — Other Ambulatory Visit: Payer: Self-pay

## 2019-10-30 ENCOUNTER — Inpatient Hospital Stay: Payer: Medicare Other

## 2019-10-30 ENCOUNTER — Telehealth: Payer: Self-pay

## 2019-10-30 VITALS — BP 130/67 | HR 64 | Temp 98.3°F | Resp 18

## 2019-10-30 DIAGNOSIS — Z5111 Encounter for antineoplastic chemotherapy: Secondary | ICD-10-CM | POA: Diagnosis not present

## 2019-10-30 DIAGNOSIS — C3491 Malignant neoplasm of unspecified part of right bronchus or lung: Secondary | ICD-10-CM

## 2019-10-30 DIAGNOSIS — C3490 Malignant neoplasm of unspecified part of unspecified bronchus or lung: Secondary | ICD-10-CM

## 2019-10-30 MED ORDER — SODIUM CHLORIDE 0.9 % IV SOLN
10.0000 mg | Freq: Once | INTRAVENOUS | Status: AC
  Administered 2019-10-30: 10 mg via INTRAVENOUS
  Filled 2019-10-30: qty 10

## 2019-10-30 MED ORDER — SODIUM CHLORIDE 0.9 % IV SOLN
100.0000 mg/m2 | Freq: Once | INTRAVENOUS | Status: AC
  Administered 2019-10-30: 220 mg via INTRAVENOUS
  Filled 2019-10-30: qty 11

## 2019-10-30 MED ORDER — SODIUM CHLORIDE 0.9 % IV SOLN
Freq: Once | INTRAVENOUS | Status: AC
  Filled 2019-10-30: qty 250

## 2019-10-30 NOTE — Telephone Encounter (Signed)
CT scan has been ordered for the pt to have around 11/14/19. I have spoken to pt and his wife and advised of this and to be on the look out for a call from Radiology for scheduling. They expressed understanding of this information.

## 2019-10-30 NOTE — Patient Instructions (Signed)
[facility] Discharge Instructions for Patients Receiving Chemotherapy  Today you received the following chemotherapy agents: etoposide  To help prevent nausea and vomiting after your treatment, we encourage you to take your nausea medication as directed.   If you develop nausea and vomiting that is not controlled by your nausea medication, call the clinic.   BELOW ARE SYMPTOMS THAT SHOULD BE REPORTED IMMEDIATELY:  *FEVER GREATER THAN 100.5 F  *CHILLS WITH OR WITHOUT FEVER  NAUSEA AND VOMITING THAT IS NOT CONTROLLED WITH YOUR NAUSEA MEDICATION  *UNUSUAL SHORTNESS OF BREATH  *UNUSUAL BRUISING OR BLEEDING  TENDERNESS IN MOUTH AND THROAT WITH OR WITHOUT PRESENCE OF ULCERS  *URINARY PROBLEMS  *BOWEL PROBLEMS  UNUSUAL RASH Items with * indicate a potential emergency and should be followed up as soon as possible.  Feel free to call the clinic should you have any questions or concerns. The clinic phone number is [phone].  Please show the CHEMO ALERT CARD at check-in to the Emergency Department and triage nurse.   

## 2019-10-31 ENCOUNTER — Other Ambulatory Visit: Payer: Self-pay

## 2019-10-31 ENCOUNTER — Inpatient Hospital Stay: Payer: Medicare Other

## 2019-10-31 VITALS — BP 128/63 | HR 61 | Temp 98.2°F | Resp 16

## 2019-10-31 DIAGNOSIS — C3491 Malignant neoplasm of unspecified part of right bronchus or lung: Secondary | ICD-10-CM

## 2019-10-31 DIAGNOSIS — Z5111 Encounter for antineoplastic chemotherapy: Secondary | ICD-10-CM | POA: Diagnosis not present

## 2019-10-31 MED ORDER — SODIUM CHLORIDE 0.9 % IV SOLN
10.0000 mg | Freq: Once | INTRAVENOUS | Status: AC
  Administered 2019-10-31: 10 mg via INTRAVENOUS
  Filled 2019-10-31: qty 10

## 2019-10-31 MED ORDER — SODIUM CHLORIDE 0.9 % IV SOLN
100.0000 mg/m2 | Freq: Once | INTRAVENOUS | Status: AC
  Administered 2019-10-31: 220 mg via INTRAVENOUS
  Filled 2019-10-31: qty 11

## 2019-10-31 MED ORDER — SODIUM CHLORIDE 0.9 % IV SOLN
Freq: Once | INTRAVENOUS | Status: AC
  Filled 2019-10-31: qty 250

## 2019-10-31 NOTE — Patient Instructions (Signed)
[facility] Discharge Instructions for Patients Receiving Chemotherapy  Today you received the following chemotherapy agents: etoposide  To help prevent nausea and vomiting after your treatment, we encourage you to take your nausea medication as directed.   If you develop nausea and vomiting that is not controlled by your nausea medication, call the clinic.   BELOW ARE SYMPTOMS THAT SHOULD BE REPORTED IMMEDIATELY:  *FEVER GREATER THAN 100.5 F  *CHILLS WITH OR WITHOUT FEVER  NAUSEA AND VOMITING THAT IS NOT CONTROLLED WITH YOUR NAUSEA MEDICATION  *UNUSUAL SHORTNESS OF BREATH  *UNUSUAL BRUISING OR BLEEDING  TENDERNESS IN MOUTH AND THROAT WITH OR WITHOUT PRESENCE OF ULCERS  *URINARY PROBLEMS  *BOWEL PROBLEMS  UNUSUAL RASH Items with * indicate a potential emergency and should be followed up as soon as possible.  Feel free to call the clinic should you have any questions or concerns. The clinic phone number is [phone].  Please show the CHEMO ALERT CARD at check-in to the Emergency Department and triage nurse.   

## 2019-11-02 ENCOUNTER — Other Ambulatory Visit: Payer: Self-pay

## 2019-11-02 ENCOUNTER — Ambulatory Visit: Payer: Medicare Other

## 2019-11-02 VITALS — BP 138/74 | HR 65 | Temp 97.8°F | Resp 18

## 2019-11-02 DIAGNOSIS — Z5111 Encounter for antineoplastic chemotherapy: Secondary | ICD-10-CM | POA: Diagnosis not present

## 2019-11-02 MED ORDER — PEGFILGRASTIM-CBQV 6 MG/0.6ML ~~LOC~~ SOSY
6.0000 mg | PREFILLED_SYRINGE | Freq: Once | SUBCUTANEOUS | Status: AC
  Administered 2019-11-02: 6 mg via SUBCUTANEOUS

## 2019-11-02 NOTE — Patient Instructions (Signed)

## 2019-11-04 ENCOUNTER — Other Ambulatory Visit: Payer: Self-pay

## 2019-11-04 ENCOUNTER — Inpatient Hospital Stay: Payer: Medicare Other

## 2019-11-04 DIAGNOSIS — Z5111 Encounter for antineoplastic chemotherapy: Secondary | ICD-10-CM | POA: Diagnosis not present

## 2019-11-04 DIAGNOSIS — C3491 Malignant neoplasm of unspecified part of right bronchus or lung: Secondary | ICD-10-CM

## 2019-11-04 LAB — CBC WITH DIFFERENTIAL (CANCER CENTER ONLY)
Abs Immature Granulocytes: 0 10*3/uL (ref 0.00–0.07)
Basophils Absolute: 0 10*3/uL (ref 0.0–0.1)
Basophils Relative: 0 %
Eosinophils Absolute: 0.4 10*3/uL (ref 0.0–0.5)
Eosinophils Relative: 1 %
HCT: 32.4 % — ABNORMAL LOW (ref 39.0–52.0)
Hemoglobin: 10.6 g/dL — ABNORMAL LOW (ref 13.0–17.0)
Lymphocytes Relative: 5 %
Lymphs Abs: 2.1 10*3/uL (ref 0.7–4.0)
MCH: 26.4 pg (ref 26.0–34.0)
MCHC: 32.7 g/dL (ref 30.0–36.0)
MCV: 80.8 fL (ref 80.0–100.0)
Monocytes Absolute: 0.4 10*3/uL (ref 0.1–1.0)
Monocytes Relative: 1 %
Neutro Abs: 38.1 10*3/uL — ABNORMAL HIGH (ref 1.7–7.7)
Neutrophils Relative %: 93 %
Platelet Count: 243 10*3/uL (ref 150–400)
RBC: 4.01 MIL/uL — ABNORMAL LOW (ref 4.22–5.81)
RDW: 17.8 % — ABNORMAL HIGH (ref 11.5–15.5)
WBC Count: 41 10*3/uL — ABNORMAL HIGH (ref 4.0–10.5)
nRBC: 0 % (ref 0.0–0.2)

## 2019-11-04 LAB — CMP (CANCER CENTER ONLY)
ALT: 10 U/L (ref 0–44)
AST: 7 U/L — ABNORMAL LOW (ref 15–41)
Albumin: 3 g/dL — ABNORMAL LOW (ref 3.5–5.0)
Alkaline Phosphatase: 103 U/L (ref 38–126)
Anion gap: 6 (ref 5–15)
BUN: 13 mg/dL (ref 8–23)
CO2: 31 mmol/L (ref 22–32)
Calcium: 8.7 mg/dL — ABNORMAL LOW (ref 8.9–10.3)
Chloride: 96 mmol/L — ABNORMAL LOW (ref 98–111)
Creatinine: 0.81 mg/dL (ref 0.61–1.24)
GFR, Estimated: >60 mL/min (ref >60)
Glucose, Bld: 162 mg/dL — ABNORMAL HIGH (ref 70–99)
Potassium: 3.5 mmol/L (ref 3.5–5.1)
Sodium: 133 mmol/L — ABNORMAL LOW (ref 135–145)
Total Bilirubin: 0.7 mg/dL (ref 0.3–1.2)
Total Protein: 6.2 g/dL — ABNORMAL LOW (ref 6.5–8.1)

## 2019-11-11 ENCOUNTER — Other Ambulatory Visit: Payer: Self-pay

## 2019-11-11 ENCOUNTER — Inpatient Hospital Stay: Payer: Medicare Other

## 2019-11-11 DIAGNOSIS — Z5111 Encounter for antineoplastic chemotherapy: Secondary | ICD-10-CM | POA: Diagnosis not present

## 2019-11-11 DIAGNOSIS — C3491 Malignant neoplasm of unspecified part of right bronchus or lung: Secondary | ICD-10-CM

## 2019-11-11 LAB — CBC WITH DIFFERENTIAL (CANCER CENTER ONLY)
Abs Immature Granulocytes: 1.85 10*3/uL — ABNORMAL HIGH (ref 0.00–0.07)
Basophils Absolute: 0.2 10*3/uL — ABNORMAL HIGH (ref 0.0–0.1)
Basophils Relative: 1 %
Eosinophils Absolute: 0 10*3/uL (ref 0.0–0.5)
Eosinophils Relative: 0 %
HCT: 36.5 % — ABNORMAL LOW (ref 39.0–52.0)
Hemoglobin: 11.5 g/dL — ABNORMAL LOW (ref 13.0–17.0)
Immature Granulocytes: 13 %
Lymphocytes Relative: 19 %
Lymphs Abs: 2.6 10*3/uL (ref 0.7–4.0)
MCH: 26.4 pg (ref 26.0–34.0)
MCHC: 31.5 g/dL (ref 30.0–36.0)
MCV: 83.9 fL (ref 80.0–100.0)
Monocytes Absolute: 1 10*3/uL (ref 0.1–1.0)
Monocytes Relative: 7 %
Neutro Abs: 8.2 10*3/uL — ABNORMAL HIGH (ref 1.7–7.7)
Neutrophils Relative %: 60 %
Platelet Count: 130 10*3/uL — ABNORMAL LOW (ref 150–400)
RBC: 4.35 MIL/uL (ref 4.22–5.81)
RDW: 19.7 % — ABNORMAL HIGH (ref 11.5–15.5)
WBC Count: 13.9 10*3/uL — ABNORMAL HIGH (ref 4.0–10.5)
nRBC: 0.4 % — ABNORMAL HIGH (ref 0.0–0.2)

## 2019-11-11 LAB — CMP (CANCER CENTER ONLY)
ALT: 15 U/L (ref 0–44)
AST: 10 U/L — ABNORMAL LOW (ref 15–41)
Albumin: 3.4 g/dL — ABNORMAL LOW (ref 3.5–5.0)
Alkaline Phosphatase: 117 U/L (ref 38–126)
Anion gap: 7 (ref 5–15)
BUN: 8 mg/dL (ref 8–23)
CO2: 29 mmol/L (ref 22–32)
Calcium: 9.1 mg/dL (ref 8.9–10.3)
Chloride: 98 mmol/L (ref 98–111)
Creatinine: 1.03 mg/dL (ref 0.61–1.24)
GFR, Estimated: >60 mL/min (ref >60)
Glucose, Bld: 143 mg/dL — ABNORMAL HIGH (ref 70–99)
Potassium: 3.3 mmol/L — ABNORMAL LOW (ref 3.5–5.1)
Sodium: 134 mmol/L — ABNORMAL LOW (ref 135–145)
Total Bilirubin: 0.2 mg/dL — ABNORMAL LOW (ref 0.3–1.2)
Total Protein: 7 g/dL (ref 6.5–8.1)

## 2019-11-13 ENCOUNTER — Other Ambulatory Visit (HOSPITAL_COMMUNITY): Payer: Self-pay | Admitting: Family Medicine

## 2019-11-14 ENCOUNTER — Ambulatory Visit (HOSPITAL_COMMUNITY)
Admission: RE | Admit: 2019-11-14 | Discharge: 2019-11-14 | Disposition: A | Discharge status: Home / Self Care | Payer: Medicare Other | Source: Ambulatory Visit | Attending: Physician Assistant | Admitting: Physician Assistant

## 2019-11-14 ENCOUNTER — Encounter (HOSPITAL_COMMUNITY): Payer: Self-pay

## 2019-11-14 ENCOUNTER — Other Ambulatory Visit: Payer: Self-pay

## 2019-11-14 DIAGNOSIS — C3490 Malignant neoplasm of unspecified part of unspecified bronchus or lung: Secondary | ICD-10-CM | POA: Diagnosis present

## 2019-11-14 MED ORDER — IOHEXOL 300 MG/ML  SOLN
75.0000 mL | Freq: Once | INTRAMUSCULAR | Status: AC | PRN
  Administered 2019-11-14: 75 mL via INTRAVENOUS

## 2019-11-18 ENCOUNTER — Encounter: Payer: Self-pay | Admitting: Internal Medicine

## 2019-11-18 ENCOUNTER — Inpatient Hospital Stay: Payer: Medicare Other | Attending: Physician Assistant | Admitting: Internal Medicine

## 2019-11-18 ENCOUNTER — Other Ambulatory Visit: Payer: Self-pay

## 2019-11-18 ENCOUNTER — Inpatient Hospital Stay: Payer: Medicare Other

## 2019-11-18 VITALS — BP 142/74 | HR 59 | Temp 97.2°F | Resp 18 | Ht 72.0 in | Wt 201.6 lb

## 2019-11-18 DIAGNOSIS — Z8673 Personal history of transient ischemic attack (TIA), and cerebral infarction without residual deficits: Secondary | ICD-10-CM | POA: Diagnosis not present

## 2019-11-18 DIAGNOSIS — C3490 Malignant neoplasm of unspecified part of unspecified bronchus or lung: Secondary | ICD-10-CM | POA: Diagnosis present

## 2019-11-18 DIAGNOSIS — Z5111 Encounter for antineoplastic chemotherapy: Secondary | ICD-10-CM | POA: Insufficient documentation

## 2019-11-18 DIAGNOSIS — I10 Essential (primary) hypertension: Secondary | ICD-10-CM | POA: Insufficient documentation

## 2019-11-18 DIAGNOSIS — C3491 Malignant neoplasm of unspecified part of right bronchus or lung: Secondary | ICD-10-CM | POA: Diagnosis not present

## 2019-11-18 DIAGNOSIS — E119 Type 2 diabetes mellitus without complications: Secondary | ICD-10-CM | POA: Diagnosis not present

## 2019-11-18 DIAGNOSIS — Z5189 Encounter for other specified aftercare: Secondary | ICD-10-CM | POA: Diagnosis not present

## 2019-11-18 DIAGNOSIS — I509 Heart failure, unspecified: Secondary | ICD-10-CM | POA: Diagnosis not present

## 2019-11-18 DIAGNOSIS — I252 Old myocardial infarction: Secondary | ICD-10-CM | POA: Insufficient documentation

## 2019-11-18 DIAGNOSIS — Z923 Personal history of irradiation: Secondary | ICD-10-CM | POA: Insufficient documentation

## 2019-11-18 DIAGNOSIS — I2510 Atherosclerotic heart disease of native coronary artery without angina pectoris: Secondary | ICD-10-CM | POA: Insufficient documentation

## 2019-11-18 DIAGNOSIS — Z79899 Other long term (current) drug therapy: Secondary | ICD-10-CM | POA: Diagnosis not present

## 2019-11-18 DIAGNOSIS — C771 Secondary and unspecified malignant neoplasm of intrathoracic lymph nodes: Secondary | ICD-10-CM | POA: Insufficient documentation

## 2019-11-18 DIAGNOSIS — K219 Gastro-esophageal reflux disease without esophagitis: Secondary | ICD-10-CM | POA: Insufficient documentation

## 2019-11-18 DIAGNOSIS — E7800 Pure hypercholesterolemia, unspecified: Secondary | ICD-10-CM | POA: Diagnosis not present

## 2019-11-18 DIAGNOSIS — J449 Chronic obstructive pulmonary disease, unspecified: Secondary | ICD-10-CM | POA: Insufficient documentation

## 2019-11-18 LAB — CBC WITH DIFFERENTIAL (CANCER CENTER ONLY)
Abs Immature Granulocytes: 0.08 10*3/uL — ABNORMAL HIGH (ref 0.00–0.07)
Basophils Absolute: 0.1 10*3/uL (ref 0.0–0.1)
Basophils Relative: 1 %
Eosinophils Absolute: 0 10*3/uL (ref 0.0–0.5)
Eosinophils Relative: 0 %
HCT: 36.8 % — ABNORMAL LOW (ref 39.0–52.0)
Hemoglobin: 12 g/dL — ABNORMAL LOW (ref 13.0–17.0)
Immature Granulocytes: 1 %
Lymphocytes Relative: 23 %
Lymphs Abs: 2 10*3/uL (ref 0.7–4.0)
MCH: 26.8 pg (ref 26.0–34.0)
MCHC: 32.6 g/dL (ref 30.0–36.0)
MCV: 82.3 fL (ref 80.0–100.0)
Monocytes Absolute: 0.9 10*3/uL (ref 0.1–1.0)
Monocytes Relative: 11 %
Neutro Abs: 5.5 10*3/uL (ref 1.7–7.7)
Neutrophils Relative %: 64 %
Platelet Count: 307 10*3/uL (ref 150–400)
RBC: 4.47 MIL/uL (ref 4.22–5.81)
RDW: 19.7 % — ABNORMAL HIGH (ref 11.5–15.5)
WBC Count: 8.5 10*3/uL (ref 4.0–10.5)
nRBC: 0 % (ref 0.0–0.2)

## 2019-11-18 LAB — CMP (CANCER CENTER ONLY)
ALT: 10 U/L (ref 0–44)
AST: 10 U/L — ABNORMAL LOW (ref 15–41)
Albumin: 3.4 g/dL — ABNORMAL LOW (ref 3.5–5.0)
Alkaline Phosphatase: 98 U/L (ref 38–126)
Anion gap: 6 (ref 5–15)
BUN: 7 mg/dL — ABNORMAL LOW (ref 8–23)
CO2: 28 mmol/L (ref 22–32)
Calcium: 9.2 mg/dL (ref 8.9–10.3)
Chloride: 99 mmol/L (ref 98–111)
Creatinine: 0.9 mg/dL (ref 0.61–1.24)
GFR, Estimated: >60 mL/min (ref >60)
Glucose, Bld: 148 mg/dL — ABNORMAL HIGH (ref 70–99)
Potassium: 3.4 mmol/L — ABNORMAL LOW (ref 3.5–5.1)
Sodium: 133 mmol/L — ABNORMAL LOW (ref 135–145)
Total Bilirubin: 0.3 mg/dL (ref 0.3–1.2)
Total Protein: 7.1 g/dL (ref 6.5–8.1)

## 2019-11-18 MED ORDER — SODIUM CHLORIDE 0.9 % IV SOLN
10.0000 mg | Freq: Once | INTRAVENOUS | Status: AC
  Administered 2019-11-18: 10 mg via INTRAVENOUS
  Filled 2019-11-18: qty 10

## 2019-11-18 MED ORDER — SODIUM CHLORIDE 0.9 % IV SOLN
150.0000 mg | Freq: Once | INTRAVENOUS | Status: AC
  Administered 2019-11-18: 150 mg via INTRAVENOUS
  Filled 2019-11-18: qty 150

## 2019-11-18 MED ORDER — PALONOSETRON HCL INJECTION 0.25 MG/5ML
0.2500 mg | Freq: Once | INTRAVENOUS | Status: AC
  Administered 2019-11-18: 0.25 mg via INTRAVENOUS

## 2019-11-18 MED ORDER — SODIUM CHLORIDE 0.9 % IV SOLN
572.5000 mg | Freq: Once | INTRAVENOUS | Status: AC
  Administered 2019-11-18: 570 mg via INTRAVENOUS
  Filled 2019-11-18: qty 57

## 2019-11-18 MED ORDER — PALONOSETRON HCL INJECTION 0.25 MG/5ML
INTRAVENOUS | Status: AC
  Filled 2019-11-18: qty 5

## 2019-11-18 MED ORDER — SODIUM CHLORIDE 0.9 % IV SOLN
Freq: Once | INTRAVENOUS | Status: AC
  Filled 2019-11-18: qty 250

## 2019-11-18 MED ORDER — SODIUM CHLORIDE 0.9 % IV SOLN
100.0000 mg/m2 | Freq: Once | INTRAVENOUS | Status: AC
  Administered 2019-11-18: 220 mg via INTRAVENOUS
  Filled 2019-11-18: qty 11

## 2019-11-18 NOTE — Progress Notes (Signed)
Jonathon [name] [phone]   [phone]) [phone]  OFFICE PROGRESS NOTE  [name], MD [address] [place] [address]  DIAGNOSIS:  limited stage (T3, N2, M0) small cell lung cancer presented with pulmonary nodules and pleural-based nodules in the right lung in addition to mediastinal lymphadenopathy diagnosed in August 2021.  The patient was previously treated without biopsy to a pleural-based nodule in the right lower lobe with SBRT in March 2021 but had evidence for disease recurrence and progression.  PRIOR THERAPY: SBRT to right lower lobe pulmonary nodule in March 2021 under the care of Dr. Sondra Come  CURRENT THERAPY: Systemic chemotherapy with carboplatin for AUC of 5 on day 1 and etoposide 100 mg/M2 on days 1, 2 and 3 with Neulasta support every 3 weeks.  First cycle 10/07/2019.  Status post 2 cycles.  INTERVAL HISTORY: Jonathon [name] 69 y.o. male returns to the clinic today for follow-up visit accompanied by his wife.  The patient is feeling fine today with no concerning complaints except for the pain after the Neulasta injection.  He denied having any current chest pain but has shortness of breath with exertion with no cough or hemoptysis.  He denied having any nausea, vomiting, diarrhea or constipation.  He denied having any headache or visual changes.  He has no recent weight loss or night sweats.  The patient had repeat CT scan of the chest performed recently and is here for evaluation and discussion of his discuss results.   MEDICAL HISTORY: Past Medical History:  Diagnosis Date  . CHF (congestive heart failure) ([place])   . Chronic cough   . COPD, severe ([place])    PT DENIES REGULAR DAILY INHALER ANY PRN  . Coronary artery disease   . CVA (cerebral vascular accident) ([place]) 02/22/2011   LEFT BRAIN STEM PONTINE HEMORRHAGE--  RIGHT SIDED WEAKNESS  . Diabetes mellitus without complication ([place])    type 2  . Dyspnea   . GERD  (gastroesophageal reflux disease)   . Harsh voice quality    PT STATES NORMAL FOR HIM  . High cholesterol   . History of CHF (congestive heart failure) MONITORED BY DR BOUSKA   DIASTOLIC  . Hydrocele, left   . Hypertension   . NSTEMI (non-ST elevated myocardial infarction) ([place]) 12/2015  . Pre-operative clearance    GIVEN BY DR Coletta Memos  . Short of breath on exertion   . Smoker   . Weakness of right side of body    S/P CVA   FEB 2013    ALLERGIES:  is allergic to atorvastatin.  MEDICATIONS:  Current Outpatient Medications  Medication Sig Dispense Refill  . albuterol (PROVENTIL) (2.5 MG/3ML) 0.083% nebulizer solution Take 2.5 mg by nebulization every 4 (four) hours as needed for wheezing or shortness of breath.    . Albuterol Sulfate (PROAIR RESPICLICK) 841 (90 BASE) MCG/ACT AEPB Inhale 2 puffs into the lungs 4 (four) times daily as needed. 1 each 5  . amLODipine (NORVASC) 10 MG tablet Take 10 mg by mouth every morning.     Marland Kitchen atorvastatin (LIPITOR) 20 MG tablet Take 20 mg by mouth daily.    . bisoprolol (ZEBETA) 5 MG tablet Take 1 tablet (5 mg total) by mouth daily. 30 tablet 0  . budesonide-formoterol (SYMBICORT) 160-4.5 MCG/ACT inhaler Inhale 2 puffs into the lungs 2 (two) times daily.    . cloNIDine (CATAPRES) 0.3 MG tablet Take 0.3 mg by mouth 2 (two) times  daily.    . Cyanocobalamin (VITAMIN B 12 PO) Take 1 tablet by mouth every morning.     . cyclobenzaprine (FLEXERIL) 5 MG tablet Take 5 mg by mouth at bedtime.     . dabigatran (PRADAXA) 150 MG CAPS capsule Take 150 mg by mouth 2 (two) times daily.    . DULoxetine (CYMBALTA) 30 MG capsule Take 30 mg by mouth every morning.     . gabapentin (NEURONTIN) 400 MG capsule Take 400 mg by mouth 3 (three) times daily.     Marland Kitchen losartan (COZAAR) 50 MG tablet Take 50 mg by mouth daily.    . metFORMIN (GLUCOPHAGE) 500 MG tablet Take 500 mg by mouth daily with breakfast.    . nitroGLYCERIN (NITROSTAT) 0.4 MG SL tablet Place 0.4 mg under the  tongue every 5 (five) minutes as needed for chest pain.    . nortriptyline (PAMELOR) 25 MG capsule Take 25 mg by mouth at bedtime.    . pantoprazole (PROTONIX) 20 MG tablet TAKE 1 TABLET BY MOUTH DAILY 90 tablet 2  . prochlorperazine (COMPAZINE) 10 MG tablet Take 1 tablet (10 mg total) by mouth every 6 (six) hours as needed for nausea or vomiting. 30 tablet 0  . tamsulosin (FLOMAX) 0.4 MG CAPS capsule Take 0.4 mg by mouth daily.    . Tiotropium Bromide Monohydrate (SPIRIVA RESPIMAT) 2.5 MCG/ACT AERS Inhale 2 puffs into the lungs daily. 4 g 5   No current facility-administered medications for this visit.    SURGICAL HISTORY:  Past Surgical History:  Procedure Laterality Date  . ABDOMINAL ANGIOGRAM  09/04/2012   Procedure: ABDOMINAL ANGIOGRAM;  Surgeon: Laverda Page, MD;  [facility] C[facility] LAB;  Service: Cardiovascular;;  . CARDIAC CATHETERIZATION  05-02-2011  DR Johnsie Cancel   MILD NONOBSTRUCTIVE CAD/ LAD, OM , AND D1/ EF 60^  . CARDIAC CATHETERIZATION  AUG 2000   NON-OBSTRUTIVE CAD/ EF 68%/ 25% PROXIMAL LAD/ 20% MID CIRCUMFLEX  . CARDIAC CATHETERIZATION [name] 12/23/2015   Procedure: Left Heart Cath and Coronary Angiography;  Surgeon: Adrian Prows, MD;  Location: [place] CV LAB;  Service: Cardiovascular;  Laterality: [name];  . CARDIAC CATHETERIZATION [name] 12/24/2015   Procedure: Coronary Stent Intervention;  Surgeon: Adrian Prows, MD;  Location: [place] CV LAB;  Service: Cardiovascular;  Laterality: [name];  . CORONARY ANGIOPLASTY    . CORONARY STENT PLACEMENT  12/24/2015    PTCA and stenting of the distal RCA   . HYDROCELE EXCISION Left 04/24/2012   Procedure: HYDROCELECTOMY ADULT;  Surgeon: Fredricka Bonine, MD;  Location: [facility];  Service: Urology;  Laterality: Left;  90 mins req for this case     . [place]  . LEFT AND RIGHT HEART CATHETERIZATION WITH CORONARY ANGIOGRAM [name] 09/04/2012   Procedure: LEFT AND RIGHT HEART CATHETERIZATION WITH CORONARY  ANGIOGRAM;  Surgeon: Laverda Page, MD;  Location: [facility] CATH LAB;  Service: Cardiovascular;  Laterality: [name];  . LEFT HEART CATHETERIZATION WITH CORONARY ANGIOGRAM [name] 05/02/2011   Procedure: LEFT HEART CATHETERIZATION WITH CORONARY ANGIOGRAM;  Surgeon: Josue Hector, MD;  Location: [facility] CATH LAB;  Service: Cardiovascular;  Laterality: [name];  . THORACOTOMY/LOBECTOMY Right 03-06-2008   AND STAPLING OF BLEBS FOR SPONTANOUS PNEUMOTHORAX  . TRANSTHORACIC ECHOCARDIOGRAM  02-17-2011   MODERATE LVH/ LVSF NORMAL/ EF 24-23%/ GRADE I DIASTOLIC DYSFUNCTION    REVIEW OF SYSTEMS:  Constitutional: positive for fatigue Eyes: negative Ears, nose, mouth, throat, and face: negative Respiratory: positive for dyspnea on exertion Cardiovascular:  negative Gastrointestinal: negative Genitourinary:negative Integument/breast: negative Hematologic/lymphatic: negative Musculoskeletal:negative Neurological: negative Behavioral/Psych: negative Endocrine: negative Allergic/Immunologic: negative   PHYSICAL EXAMINATION: General appearance: alert, cooperative and no distress Head: Normocephalic, without obvious abnormality, atraumatic Neck: no adenopathy, no JVD, supple, symmetrical, trachea midline and thyroid not enlarged, symmetric, no tenderness/mass/nodules Lymph nodes: Cervical, supraclavicular, and axillary nodes normal. Resp: clear to auscultation bilaterally Back: symmetric, no curvature. ROM normal. No CVA tenderness. Cardio: regular rate and rhythm, S1, S2 normal, no murmur, click, rub or gallop GI: soft, non-tender; bowel sounds normal; no masses,  no organomegaly Extremities: extremities normal, atraumatic, no cyanosis or edema Neurologic: Alert and oriented X 3, normal strength and tone. Normal symmetric reflexes. Normal coordination and gait  ECOG PERFORMANCE STATUS: 1 - Symptomatic but completely ambulatory  Blood pressure (!) 142/74, pulse (!) 59, temperature (!) 97.2 F (36.2 C), temperature  source Tympanic, resp. rate 18, height 6' (1.829 m), weight 201 lb 9.6 oz (91.4 kg), SpO2 98 %.  LABORATORY DATA: Lab Results  Component Value Date   WBC 8.5 11/18/2019   HGB 12.0 (L) 11/18/2019   HCT 36.8 (L) 11/18/2019   MCV 82.3 11/18/2019   PLT 307 11/18/2019      Chemistry      Component Value Date/Time   NA 133 (L) 11/18/2019 1120   K 3.4 (L) 11/18/2019 1120   CL 99 11/18/2019 1120   CO2 28 11/18/2019 1120   BUN 7 (L) 11/18/2019 1120   CREATININE 0.90 11/18/2019 1120      Component Value Date/Time   CALCIUM 9.2 11/18/2019 1120   ALKPHOS 98 11/18/2019 1120   AST 10 (L) 11/18/2019 1120   ALT 10 11/18/2019 1120   BILITOT 0.3 11/18/2019 1120       RADIOGRAPHIC STUDIES: CT Chest W Contrast  Result Date: 11/15/2019 CLINICAL DATA:  Small cell lung cancer.  Restaging. EXAM: CT CHEST WITH CONTRAST TECHNIQUE: Multidetector CT imaging of the chest was performed during intravenous contrast administration. CONTRAST:  39mL OMNIPAQUE IOHEXOL 300 MG/ML  SOLN COMPARISON:  PET-CT 08/19/2019 FINDINGS: Cardiovascular: Normal heart size. No pericardial effusion identified. Aortic atherosclerosis. Coronary artery calcifications. Mediastinum/Nodes: No enlarged supraclavicular or axillary lymph nodes. High right paratracheal lymph node measures 0.9 cm, image 41/2. Unchanged from previous exam. Right hilar lymph node measures 1.5 cm, image 97/2. Previously 1.7 cm. Lungs/Pleura: No pleural effusion identified. Right posteromedial diaphragmatic hernia is identified containing fat only. Unchanged. Advanced changes of centrilobular and paraseptal emphysema with bullous changes. Non FDG avid, subpleural nodule within the paramediastinal aspect of the lingula measures 2.3 x 1.7 cm, image 113/7. Previously 2.2 x 1.8 cm. Previous FDG avid subpleural nodule within the right lower lung measures 1.3 x 0.6 cm, image 113/7. Previously 1.8 x 1.2 cm. 6 mm nodule within the right upper lungs measures 0.6 cm, image  51/7. Previously 0.8 cm. The right juxta hilar lung nodule measures 2.1 x 1.6 cm, image 97/7. Previously 3.0 x 2.8 cm. Upper Abdomen: No acute abnormality noted within the imaged portions of the upper abdomen. The adrenal glands are unremarkable. Left upper pole kidney cyst measures 2.7 cm, image 178/2. Musculoskeletal: No acute or suspicious osseous findings. IMPRESSION: 1. Interval response to therapy. The right juxta hilar lung nodule has decreased in size in the interval. The subpleural nodule within the periphery of the right lower lobe lung is also decreased in size in the interval. The tiny nodule within the right upper lung is stable to mildly decreased in the interval. 2. Stable to mild  decrease in size of right hilar and right paratracheal lymph nodes. 3. Emphysema and aortic atherosclerosis. 4. Coronary artery calcifications. Aortic Atherosclerosis (ICD10-I70.0) and Emphysema (ICD10-J43.9). Electronically Signed   By: Kerby Moors [name]   On: 11/15/2019 08:55    ASSESSMENT AND PLAN: This is a very pleasant 69 years old white male recently diagnosed with limited stage small cell lung cancer (T3, N2, M0) presented with pleural-based pulmonary nodules in the right lung in addition to mediastinal lymphadenopathy in August 2021.  The patient was initially diagnosed with a pleural-based nodule status post SBRT in March 2021. He is currently undergoing systemic chemotherapy with carboplatin for AUC of 5 from day 1 and to etoposide 100 mg/M2 on days 1, 2 and 3 with Neulasta support every 3 weeks.  Status post 2 cycles. The patient is tolerating his treatment well with no concerning adverse effects. He had repeat CT scan of the chest performed recently.  I personally and independently reviewed the scan and discussed the results with the patient and his wife. His scan showed improvement of his disease after the first 2 cycles. I recommended to proceed with cycle #3 of his treatment today as planned. The  patient will come back for follow-up visit in 3 weeks for evaluation before the next cycle of his treatment. He was advised to call immediately if he has any concerning symptoms in the interval.  The patient voices understanding of current disease status and treatment options and is in agreement with the current care plan.  All questions were answered. The patient knows to call the clinic with any problems, questions or concerns. We can certainly see the patient much sooner if necessary.  Disclaimer: This note was dictated with voice recognition software. Similar sounding words can inadvertently be transcribed and may not be corrected upon review.

## 2019-11-18 NOTE — Patient Instructions (Signed)
[facility] Discharge Instructions for Patients Receiving Chemotherapy  Today you received the following chemotherapy agents: Carboplatin and Etoposide  To help prevent nausea and vomiting after your treatment, we encourage you to take your nausea medication as directed by your MD.   If you develop nausea and vomiting that is not controlled by your nausea medication, call the clinic.   BELOW ARE SYMPTOMS THAT SHOULD BE REPORTED IMMEDIATELY:  *FEVER GREATER THAN 100.5 F  *CHILLS WITH OR WITHOUT FEVER  NAUSEA AND VOMITING THAT IS NOT CONTROLLED WITH YOUR NAUSEA MEDICATION  *UNUSUAL SHORTNESS OF BREATH  *UNUSUAL BRUISING OR BLEEDING  TENDERNESS IN MOUTH AND THROAT WITH OR WITHOUT PRESENCE OF ULCERS  *URINARY PROBLEMS  *BOWEL PROBLEMS  UNUSUAL RASH Items with * indicate a potential emergency and should be followed up as soon as possible.  Feel free to call the clinic should you have any questions or concerns. The clinic phone number is [phone].  Please show the [place] at check-in to the Emergency Department and triage nurse.

## 2019-11-19 ENCOUNTER — Other Ambulatory Visit: Payer: Self-pay

## 2019-11-19 ENCOUNTER — Inpatient Hospital Stay: Payer: Medicare Other

## 2019-11-19 ENCOUNTER — Other Ambulatory Visit (HOSPITAL_COMMUNITY): Payer: Self-pay | Admitting: Family Medicine

## 2019-11-19 VITALS — BP 140/69 | HR 61 | Temp 97.9°F | Resp 18

## 2019-11-19 DIAGNOSIS — Z5111 Encounter for antineoplastic chemotherapy: Secondary | ICD-10-CM | POA: Diagnosis not present

## 2019-11-19 DIAGNOSIS — C3491 Malignant neoplasm of unspecified part of right bronchus or lung: Secondary | ICD-10-CM

## 2019-11-19 MED ORDER — SODIUM CHLORIDE 0.9 % IV SOLN
Freq: Once | INTRAVENOUS | Status: AC
  Filled 2019-11-19: qty 250

## 2019-11-19 MED ORDER — SODIUM CHLORIDE 0.9 % IV SOLN
10.0000 mg | Freq: Once | INTRAVENOUS | Status: AC
  Administered 2019-11-19: 10 mg via INTRAVENOUS
  Filled 2019-11-19: qty 10

## 2019-11-19 MED ORDER — LORATADINE 10 MG PO TABS
ORAL_TABLET | ORAL | Status: AC
  Filled 2019-11-19: qty 1

## 2019-11-19 MED ORDER — ACETAMINOPHEN 325 MG PO TABS
ORAL_TABLET | ORAL | Status: AC
  Filled 2019-11-19: qty 2

## 2019-11-19 MED ORDER — SODIUM CHLORIDE 0.9 % IV SOLN
100.0000 mg/m2 | Freq: Once | INTRAVENOUS | Status: AC
  Administered 2019-11-19: 220 mg via INTRAVENOUS
  Filled 2019-11-19: qty 11

## 2019-11-19 NOTE — Patient Instructions (Signed)
[facility] Discharge Instructions for Patients Receiving Chemotherapy  Today you received the following chemotherapy agents: etoposide  To help prevent nausea and vomiting after your treatment, we encourage you to take your nausea medication as directed.   If you develop nausea and vomiting that is not controlled by your nausea medication, call the clinic.   BELOW ARE SYMPTOMS THAT SHOULD BE REPORTED IMMEDIATELY:  *FEVER GREATER THAN 100.5 F  *CHILLS WITH OR WITHOUT FEVER  NAUSEA AND VOMITING THAT IS NOT CONTROLLED WITH YOUR NAUSEA MEDICATION  *UNUSUAL SHORTNESS OF BREATH  *UNUSUAL BRUISING OR BLEEDING  TENDERNESS IN MOUTH AND THROAT WITH OR WITHOUT PRESENCE OF ULCERS  *URINARY PROBLEMS  *BOWEL PROBLEMS  UNUSUAL RASH Items with * indicate a potential emergency and should be followed up as soon as possible.  Feel free to call the clinic should you have any questions or concerns. The clinic phone number is [phone].  Please show the CHEMO ALERT CARD at check-in to the Emergency Department and triage nurse.   

## 2019-11-20 ENCOUNTER — Other Ambulatory Visit: Payer: Self-pay

## 2019-11-20 ENCOUNTER — Inpatient Hospital Stay: Payer: Medicare Other

## 2019-11-20 VITALS — BP 133/74 | HR 62 | Temp 98.9°F | Resp 18

## 2019-11-20 DIAGNOSIS — Z5111 Encounter for antineoplastic chemotherapy: Secondary | ICD-10-CM | POA: Diagnosis not present

## 2019-11-20 DIAGNOSIS — C3491 Malignant neoplasm of unspecified part of right bronchus or lung: Secondary | ICD-10-CM

## 2019-11-20 MED ORDER — SODIUM CHLORIDE 0.9 % IV SOLN
100.0000 mg/m2 | Freq: Once | INTRAVENOUS | Status: AC
  Administered 2019-11-20: 220 mg via INTRAVENOUS
  Filled 2019-11-20: qty 11

## 2019-11-20 MED ORDER — SODIUM CHLORIDE 0.9 % IV SOLN
10.0000 mg | Freq: Once | INTRAVENOUS | Status: AC
  Administered 2019-11-20: 10 mg via INTRAVENOUS
  Filled 2019-11-20: qty 10

## 2019-11-20 MED ORDER — SODIUM CHLORIDE 0.9 % IV SOLN
Freq: Once | INTRAVENOUS | Status: AC
  Filled 2019-11-20: qty 250

## 2019-11-20 NOTE — Patient Instructions (Signed)
[place] Discharge Instructions for Patients Receiving Chemotherapy  Today you received the following chemotherapy agents: Etoposide  To help prevent nausea and vomiting after your treatment, we encourage you to take your nausea medication  as prescribed.    If you develop nausea and vomiting that is not controlled by your nausea medication, call the clinic.   BELOW ARE SYMPTOMS THAT SHOULD BE REPORTED IMMEDIATELY:  *FEVER GREATER THAN 100.5 F  *CHILLS WITH OR WITHOUT FEVER  NAUSEA AND VOMITING THAT IS NOT CONTROLLED WITH YOUR NAUSEA MEDICATION  *UNUSUAL SHORTNESS OF BREATH  *UNUSUAL BRUISING OR BLEEDING  TENDERNESS IN MOUTH AND THROAT WITH OR WITHOUT PRESENCE OF ULCERS  *URINARY PROBLEMS  *BOWEL PROBLEMS  UNUSUAL RASH Items with * indicate a potential emergency and should be followed up as soon as possible.  Feel free to call the clinic should you have any questions or concerns. The clinic phone number is [phone].  Please show the [place] at check-in to the Emergency Department and triage nurse.

## 2019-11-22 ENCOUNTER — Other Ambulatory Visit: Payer: Self-pay

## 2019-11-22 ENCOUNTER — Inpatient Hospital Stay: Payer: Medicare Other

## 2019-11-22 VITALS — BP 155/83 | HR 66 | Resp 18

## 2019-11-22 DIAGNOSIS — C3491 Malignant neoplasm of unspecified part of right bronchus or lung: Secondary | ICD-10-CM

## 2019-11-22 DIAGNOSIS — Z5111 Encounter for antineoplastic chemotherapy: Secondary | ICD-10-CM | POA: Diagnosis not present

## 2019-11-22 MED ORDER — PEGFILGRASTIM-CBQV 6 MG/0.6ML ~~LOC~~ SOSY
6.0000 mg | PREFILLED_SYRINGE | Freq: Once | SUBCUTANEOUS | Status: AC
  Administered 2019-11-22: 6 mg via SUBCUTANEOUS

## 2019-11-22 NOTE — Patient Instructions (Signed)

## 2019-11-25 ENCOUNTER — Inpatient Hospital Stay: Payer: Medicare Other

## 2019-11-25 ENCOUNTER — Other Ambulatory Visit: Payer: Self-pay

## 2019-11-25 DIAGNOSIS — Z5111 Encounter for antineoplastic chemotherapy: Secondary | ICD-10-CM | POA: Diagnosis not present

## 2019-11-25 DIAGNOSIS — C3491 Malignant neoplasm of unspecified part of right bronchus or lung: Secondary | ICD-10-CM

## 2019-11-25 LAB — CBC WITH DIFFERENTIAL (CANCER CENTER ONLY)
Abs Immature Granulocytes: 0 10*3/uL (ref 0.00–0.07)
Band Neutrophils: 1 %
Basophils Absolute: 0 10*3/uL (ref 0.0–0.1)
Basophils Relative: 0 %
Eosinophils Absolute: 0 10*3/uL (ref 0.0–0.5)
Eosinophils Relative: 0 %
HCT: 31.8 % — ABNORMAL LOW (ref 39.0–52.0)
Hemoglobin: 10.5 g/dL — ABNORMAL LOW (ref 13.0–17.0)
Lymphocytes Relative: 7 %
Lymphs Abs: 1.6 10*3/uL (ref 0.7–4.0)
MCH: 27.1 pg (ref 26.0–34.0)
MCHC: 33 g/dL (ref 30.0–36.0)
MCV: 82 fL (ref 80.0–100.0)
Monocytes Absolute: 0 10*3/uL — ABNORMAL LOW (ref 0.1–1.0)
Monocytes Relative: 0 %
Neutro Abs: 21.9 10*3/uL — ABNORMAL HIGH (ref 1.7–7.7)
Neutrophils Relative %: 92 %
Platelet Count: 188 10*3/uL (ref 150–400)
RBC: 3.88 MIL/uL — ABNORMAL LOW (ref 4.22–5.81)
RDW: 19.3 % — ABNORMAL HIGH (ref 11.5–15.5)
WBC Count: 23.5 10*3/uL — ABNORMAL HIGH (ref 4.0–10.5)
nRBC: 0 % (ref 0.0–0.2)

## 2019-11-25 LAB — CMP (CANCER CENTER ONLY)
ALT: 11 U/L (ref 0–44)
AST: 9 U/L — ABNORMAL LOW (ref 15–41)
Albumin: 3.4 g/dL — ABNORMAL LOW (ref 3.5–5.0)
Alkaline Phosphatase: 116 U/L (ref 38–126)
Anion gap: 8 (ref 5–15)
BUN: 13 mg/dL (ref 8–23)
CO2: 28 mmol/L (ref 22–32)
Calcium: 8.6 mg/dL — ABNORMAL LOW (ref 8.9–10.3)
Chloride: 97 mmol/L — ABNORMAL LOW (ref 98–111)
Creatinine: 0.78 mg/dL (ref 0.61–1.24)
GFR, Estimated: >60 mL/min
Glucose, Bld: 98 mg/dL (ref 70–99)
Potassium: 3.2 mmol/L — ABNORMAL LOW (ref 3.5–5.1)
Sodium: 133 mmol/L — ABNORMAL LOW (ref 135–145)
Total Bilirubin: 0.8 mg/dL (ref 0.3–1.2)
Total Protein: 6.6 g/dL (ref 6.5–8.1)

## 2019-11-27 ENCOUNTER — Other Ambulatory Visit (HOSPITAL_COMMUNITY): Payer: Self-pay | Admitting: Family Medicine

## 2019-12-02 ENCOUNTER — Inpatient Hospital Stay: Payer: Medicare Other

## 2019-12-02 ENCOUNTER — Other Ambulatory Visit: Payer: Self-pay | Admitting: Cardiology

## 2019-12-02 ENCOUNTER — Other Ambulatory Visit: Payer: Self-pay

## 2019-12-02 ENCOUNTER — Ambulatory Visit: Payer: Medicare Other | Admitting: Cardiology

## 2019-12-02 ENCOUNTER — Encounter: Payer: Self-pay | Admitting: Cardiology

## 2019-12-02 VITALS — BP 144/74 | HR 70 | Resp 16 | Ht 72.0 in | Wt 197.0 lb

## 2019-12-02 DIAGNOSIS — I10 Essential (primary) hypertension: Secondary | ICD-10-CM

## 2019-12-02 DIAGNOSIS — C3491 Malignant neoplasm of unspecified part of right bronchus or lung: Secondary | ICD-10-CM

## 2019-12-02 DIAGNOSIS — Z5111 Encounter for antineoplastic chemotherapy: Secondary | ICD-10-CM | POA: Diagnosis not present

## 2019-12-02 DIAGNOSIS — I2510 Atherosclerotic heart disease of native coronary artery without angina pectoris: Secondary | ICD-10-CM

## 2019-12-02 DIAGNOSIS — I7123 Aneurysm of the descending thoracic aorta, without rupture: Secondary | ICD-10-CM

## 2019-12-02 DIAGNOSIS — J432 Centrilobular emphysema: Secondary | ICD-10-CM

## 2019-12-02 DIAGNOSIS — I712 Thoracic aortic aneurysm, without rupture: Secondary | ICD-10-CM

## 2019-12-02 LAB — CBC WITH DIFFERENTIAL (CANCER CENTER ONLY)
Abs Immature Granulocytes: 0.86 10*3/uL — ABNORMAL HIGH (ref 0.00–0.07)
Basophils Absolute: 0.1 10*3/uL (ref 0.0–0.1)
Basophils Relative: 1 %
Eosinophils Absolute: 0 10*3/uL (ref 0.0–0.5)
Eosinophils Relative: 0 %
HCT: 35.7 % — ABNORMAL LOW (ref 39.0–52.0)
Hemoglobin: 11.7 g/dL — ABNORMAL LOW (ref 13.0–17.0)
Immature Granulocytes: 7 %
Lymphocytes Relative: 20 %
Lymphs Abs: 2.6 10*3/uL (ref 0.7–4.0)
MCH: 27.5 pg (ref 26.0–34.0)
MCHC: 32.8 g/dL (ref 30.0–36.0)
MCV: 84 fL (ref 80.0–100.0)
Monocytes Absolute: 0.8 10*3/uL (ref 0.1–1.0)
Monocytes Relative: 6 %
Neutro Abs: 8.6 10*3/uL — ABNORMAL HIGH (ref 1.7–7.7)
Neutrophils Relative %: 66 %
Platelet Count: 110 10*3/uL — ABNORMAL LOW (ref 150–400)
RBC: 4.25 MIL/uL (ref 4.22–5.81)
RDW: 20.8 % — ABNORMAL HIGH (ref 11.5–15.5)
WBC Count: 13 10*3/uL — ABNORMAL HIGH (ref 4.0–10.5)
nRBC: 0.2 % (ref 0.0–0.2)

## 2019-12-02 LAB — CMP (CANCER CENTER ONLY)
ALT: 13 U/L (ref 0–44)
AST: 9 U/L — ABNORMAL LOW (ref 15–41)
Albumin: 3.5 g/dL (ref 3.5–5.0)
Alkaline Phosphatase: 122 U/L (ref 38–126)
Anion gap: 10 (ref 5–15)
BUN: 7 mg/dL — ABNORMAL LOW (ref 8–23)
CO2: 27 mmol/L (ref 22–32)
Calcium: 9 mg/dL (ref 8.9–10.3)
Chloride: 98 mmol/L (ref 98–111)
Creatinine: 0.99 mg/dL (ref 0.61–1.24)
GFR, Estimated: >60 mL/min (ref >60)
Glucose, Bld: 142 mg/dL — ABNORMAL HIGH (ref 70–99)
Potassium: 3.3 mmol/L — ABNORMAL LOW (ref 3.5–5.1)
Sodium: 135 mmol/L (ref 135–145)
Total Bilirubin: 0.4 mg/dL (ref 0.3–1.2)
Total Protein: 7.1 g/dL (ref 6.5–8.1)

## 2019-12-02 MED ORDER — LOSARTAN POTASSIUM 100 MG PO TABS: 100.0000 mg | ORAL_TABLET | Freq: Every day | ORAL | 3 refills | Status: DC

## 2019-12-02 NOTE — Progress Notes (Signed)
Primary Physician/Referring:  [name], MD  Patient ID: Jonathon [name], male    DOB: 26-Dec-1950, 69 y.o.   MRN: [id number]  Chief Complaint  Patient presents with  . Coronary Artery Disease  . Hypertension  . Hyperlipidemia  . Follow-up    3 months   HPI:    Jonathon [name]  is a 69 y.o.  [name] male patient with history of hypertension, hyperlipidemia, remote stroke without restrictive defect, tobacco cessation in September 2018, CAD with PCI in December 2017 to RCA. He also has history of emphysema, pulmonary nodules and right pneumothorax in 2010 due to emphysematous bleb while he was in [place] and has history of right upper lobe lobectomy for lung cancer.  He has had recurrence of cancer in the right lower lobe and is presently undergoing radiation therapy.  Patient is here on a 3 month visit. Has chronic dyspnea and cough.  No recurrence of chest pain. He reports dyspnea has been stable. He did not bring his medications today. I am seeing him at his request, he is presently undergoing evaluation for and management and treatment for recurrence of lung cancer.  He has no specific complaints today states that he is tolerating chemotherapy well.  He is happy that the tumor is responding.  Past Medical History:  Diagnosis Date  . CHF (congestive heart failure) ([place])   . Chronic cough   . COPD, severe ([place])    PT DENIES REGULAR DAILY INHALER ANY PRN  . Coronary artery disease   . CVA (cerebral vascular accident) ([place]) 02/22/2011   LEFT BRAIN STEM PONTINE HEMORRHAGE--  RIGHT SIDED WEAKNESS  . Diabetes mellitus without complication ([place])    type 2  . Dyspnea   . GERD (gastroesophageal reflux disease)   . Harsh voice quality    PT STATES NORMAL FOR HIM  . High cholesterol   . History of CHF (congestive heart failure) MONITORED BY DR BOUSKA   DIASTOLIC  . Hydrocele, left   . Hypertension   . NSTEMI (non-ST elevated myocardial infarction) ([place]) 12/2015  .  Pre-operative clearance    GIVEN BY DR Coletta Memos  . Short of breath on exertion   . Smoker   . Weakness of right side of body    S/P CVA   FEB 2013   Past Surgical History:  Procedure Laterality Date  . ABDOMINAL ANGIOGRAM  09/04/2012   Procedure: ABDOMINAL ANGIOGRAM;  Surgeon: Laverda Page, MD;  [facility] C[facility] LAB;  Service: Cardiovascular;;  . CARDIAC CATHETERIZATION  05-02-2011  DR Johnsie Cancel   MILD NONOBSTRUCTIVE CAD/ LAD, OM , AND D1/ EF 60^  . CARDIAC CATHETERIZATION  AUG 2000   NON-OBSTRUTIVE CAD/ EF 68%/ 25% PROXIMAL LAD/ 20% MID CIRCUMFLEX  . CARDIAC CATHETERIZATION [name] 12/23/2015   Procedure: Left Heart Cath and Coronary Angiography;  Surgeon: Adrian Prows, MD;  Location: [facility] CV LAB;  Service: Cardiovascular;  Laterality: [name];  . CARDIAC CATHETERIZATION [name] 12/24/2015   Procedure: Coronary Stent Intervention;  Surgeon: Adrian Prows, MD;  Location: [place] CV LAB;  Service: Cardiovascular;  Laterality: [name];  . CORONARY ANGIOPLASTY    . CORONARY STENT PLACEMENT  12/24/2015    PTCA and stenting of the distal RCA   . HYDROCELE EXCISION Left 04/24/2012   Procedure: HYDROCELECTOMY ADULT;  Surgeon: Fredricka Bonine, MD;  Location: [facility];  Service: Urology;  Laterality: Left;  90 mins req for this case     . INGUINAL HERNIA REPAIR  1954  . LEFT AND RIGHT HEART CATHETERIZATION WITH CORONARY ANGIOGRAM [name] 09/04/2012   Procedure: LEFT AND RIGHT HEART CATHETERIZATION WITH CORONARY ANGIOGRAM;  Surgeon: Laverda Page, MD;  Location: [facility] CATH LAB;  Service: Cardiovascular;  Laterality: [name];  . LEFT HEART CATHETERIZATION WITH CORONARY ANGIOGRAM [name] 05/02/2011   Procedure: LEFT HEART CATHETERIZATION WITH CORONARY ANGIOGRAM;  Surgeon: Josue Hector, MD;  Location: [facility] CATH LAB;  Service: Cardiovascular;  Laterality: [name];  . THORACOTOMY/LOBECTOMY Right 03-06-2008   AND STAPLING OF BLEBS FOR SPONTANOUS PNEUMOTHORAX  . TRANSTHORACIC ECHOCARDIOGRAM  02-17-2011    MODERATE LVH/ LVSF NORMAL/ EF 30-09%/ GRADE I DIASTOLIC DYSFUNCTION   Social History   Tobacco Use  . Smoking status: Former Smoker    Packs/day: 0.00    Years: 53.00    Pack years: 0.00    Types: Cigarettes    Quit date: 2019    Years since quitting: 2.8  . Smokeless tobacco: Never Used  . Tobacco comment: 3-4 cigarettes daily- 08/30/17  Substance Use Topics  . Alcohol use: Not Currently    Alcohol/week: 0.0 standard drinks    Comment: 02/10/2016 "nothing in the last 10 years"   Marital status: Married   ROS  Review of Systems  Cardiovascular: Negative for chest pain and leg swelling.  Respiratory: Positive for cough, shortness of breath and wheezing.   Gastrointestinal: Negative for melena.   Objective   Vitals with BMI 12/02/2019 11/22/2019 11/20/2019  Height 6\' 0"  - -  Weight 197 lbs - -  BMI 23.30 - -  Systolic 076 226 333  Diastolic 74 83 74  Pulse 70 66 62    Blood pressure (!) 144/74, pulse 70, resp. rate 16, height 6' (1.829 m), weight 197 lb (89.4 kg), SpO2 96 %. Body mass index is 26.72 kg/m.   Physical Exam Constitutional:      Comments: Well-built, mildly obese in no acute distress.  Cardiovascular:     Rate and Rhythm: Normal rate and regular rhythm.     Pulses: Normal pulses and intact distal pulses.     Heart sounds: Normal heart sounds. No murmur heard.  No gallop.      Comments: No leg edema, no JVD.  Pulmonary:     Effort: Pulmonary effort is normal.     Breath sounds: Wheezing (diffuse scattered occasional) and rales (left base) present.  Abdominal:     General: Bowel sounds are normal.     Palpations: Abdomen is soft.  Musculoskeletal:        General: Normal range of motion.    Radiology: No results found.  Laboratory examination:   CMP Latest Ref Rng & Units 12/02/2019 11/25/2019 11/18/2019  Glucose 70 - 99 mg/dL 142(H) 98 148(H)  BUN 8 - 23 mg/dL 7(L) 13 7(L)  Creatinine 0.61 - 1.24 mg/dL 0.99 0.78 0.90  Sodium 135 - 145 mmol/L 135  133(L) 133(L)  Potassium 3.5 - 5.1 mmol/L 3.3(L) 3.2(L) 3.4(L)  Chloride 98 - 111 mmol/L 98 97(L) 99  CO2 22 - 32 mmol/L 27 28 28   Calcium 8.9 - 10.3 mg/dL 9.0 8.6(L) 9.2  Total Protein 6.5 - 8.1 g/dL 7.1 6.6 7.1  Total Bilirubin 0.3 - 1.2 mg/dL 0.4 0.8 0.3  Alkaline Phos 38 - 126 U/L 122 116 98  AST 15 - 41 U/L 9(L) 9(L) 10(L)  ALT 0 - 44 U/L 13 11 10    CBC Latest Ref Rng & Units 12/02/2019 11/25/2019 11/18/2019  WBC 4.0 - 10.5 K/uL 13.0(H) 23.5(H) 8.5  Hemoglobin 13.0 - 17.0 g/dL 11.7(L) 10.5(L) 12.0(L)  Hematocrit 39 - 52 % 35.7(L) 31.8(L) 36.8(L)  Platelets 150 - 400 K/uL 110(L) 188 307   TSH No results for input(s): TSH in the last 8760 hours.   External Labs:  Labs 11/27/2019:  A1c 7.0%.  Total cholesterol 135, triglycerides 103, HDL 62, LDL 54.  Medications   Allergies  Allergen Reactions  . Atorvastatin Other (See Comments)    Severe constipation      Current Outpatient Medications  Medication Instructions  . albuterol (PROVENTIL) 2.5 mg, Nebulization, Every 4 hours PRN  . Albuterol Sulfate (PROAIR RESPICLICK) 144 (90 BASE) MCG/ACT AEPB 2 puffs, Inhalation, 4 times daily PRN  . amLODipine (NORVASC) 10 mg, Oral, BH-each morning  . atorvastatin (LIPITOR) 20 mg, Oral, Daily  . bisoprolol (ZEBETA) 5 mg, Oral, Daily  . budesonide-formoterol (SYMBICORT) 160-4.5 MCG/ACT inhaler 2 puffs, Inhalation, 2 times daily  . cloNIDine (CATAPRES) 0.3 mg, 2 times daily  . Cyanocobalamin (VITAMIN B 12 PO) 1 tablet, Oral, BH-each morning  . cyclobenzaprine (FLEXERIL) 5 mg, Oral, Daily at bedtime  . dabigatran (PRADAXA) 150 mg, Oral, 2 times daily  . DULoxetine (CYMBALTA) 30 mg, Oral, BH-each morning  . gabapentin (NEURONTIN) 400 mg, Oral, 3 times daily  . losartan (COZAAR) 100 mg, Oral, Daily  . metFORMIN (GLUCOPHAGE) 500 mg, Daily with breakfast  . nitroGLYCERIN (NITROSTAT) 0.4 mg, Sublingual, Every 5 min PRN  . nortriptyline (PAMELOR) 25 mg, Daily at bedtime  . pantoprazole  (PROTONIX) 20 MG tablet TAKE 1 TABLET BY MOUTH DAILY  . prochlorperazine (COMPAZINE) 10 mg, Oral, Every 6 hours PRN  . tamsulosin (FLOMAX) 0.4 mg, Oral, Daily  . Tiotropium Bromide Monohydrate (SPIRIVA RESPIMAT) 2.5 MCG/ACT AERS 2 puffs, Inhalation, Daily   Radiology   CT scan of the chest with contrast 11/14/2019: 1. Interval response to therapy. The right juxta hilar lung nodule has decreased in size in the interval. The subpleural nodule within the periphery of the right lower lobe lung is also decreased in size in the interval. The tiny nodule within the right upper lung is stable to mildly decreased in the interval. 2. Stable to mild decrease in size of right hilar and right paratracheal lymph nodes. 3. Emphysema and aortic atherosclerosis. 4. Coronary artery calcifications.  Compared to 01/24/2019, there is no mention of descending thoracic aortic aneurysm measuring 3.3 x 3.3 cm.  Cardiac Studies:   Coronary angiogram 12/24/2015: PTCA and stenting of the distal RCA with 4.0 x 12 mm onyx DES. Normal LVEF. Mild disease in other vessels.  Lexiscan myoview stress test 06/19/2017:  1. Lexiscan stress test was performed. Exercise capacity was not assessed. Stress symptoms included shortness of breath, lightheadedness, and chest pressure. Blood pressure was 126/78 mmHg. Stress EKG is non diagnostic for ischemia as it is a pharmacologic stress. In addition, it showed normal sinus rhythm, normal stress conduction, no stress arrhythmias and normal stress repolarization.   2. The overall quality of the study is good. There is no evidence of abnormal lung activity. Stress and rest SPECT images demonstrate homogeneous tracer distribution throughout the myocardium. Gated SPECT imaging reveals normal myocardial thickening and wall motion. The left ventricular ejection fraction was calculated as 43%, however visually appears normal. 3. Low to intermediate risk study.  Echocardiogram 08/08/2017:  Left  ventricle cavity is normal in size. Mild concentric hypertrophy of the left ventricle. Normal global wall motion. Normal diastolic filling pattern. Calculated EF 52%. Mild to moderate aortic regurgitation. Mild (Grade I) mitral  regurgitation. Inadequate tricuspid regurgitation jet to estimate pulmonary artery pressure. Normal right atrial pressure. No significant change compared to previous study on 01/06/2016.  EKG:  EKG 12/02/2019: Normal sinus rhythm at rate of 66 bpm, leftward axis, incomplete right bundle branch block.  No significant change from 05/07/2019.   Assessment     ICD-10-CM   1. Coronary artery disease involving native coronary artery of native heart without angina pectoris  I25.10 EKG 12-Lead    TSH  2. Descending thoracic aortic aneurysm (HCC) 3.3 cm 2021 CT  I71.2 losartan (COZAAR) 100 MG tablet  3. Centrilobular emphysema ([place])  J43.2   4. Primary lung small cell carcinoma, right (HCC)  C34.91   5. Essential hypertension, benign  I10 losartan (COZAAR) 100 MG tablet    Recommendations:   Uziel A Pintor  is a 69 y.o. [name] male patient with history of hypertension, hyperlipidemia, remote stroke without restrictive defect, tobacco cessation in September 2018, CAD with PCI in December 2017 to RCA. He also has history of emphysema, pulmonary nodules and right pneumothorax in 2010 due to emphysematous bleb while he was in [place] and has history of right upper lobe lobectomy for lung cancer.  He has had recurrence of cancer in the right lower lobe and is presently undergoing radiation therapy, recent notes from November 2021 from oncology reveals that he is responding to chemotherapy.  He remains asymptomatic from cardiac standpoint without recurrence of angina or clinical evidence of heart failure, blood pressure is slightly high, in view of hypokalemia noted on the recent blood work, will increase losartan 200 mg daily.  Lipids are at goal.   Due to weight loss,  I would also like to obtain a TSH as it has not been done in quite a while.  I reviewed his external labs, diabetes is also not well controlled.  No clinical evidence of heart failure.  I will see him back in 6 months or sooner if problems, with regard to thoracic aortic aneurysm, it is very small and will continue to follow-up clinically for now.   Adrian Prows, MD, [facility] 12/02/2019, 3:21 PM Office: [phone]

## 2019-12-03 ENCOUNTER — Other Ambulatory Visit (HOSPITAL_COMMUNITY): Payer: Self-pay | Admitting: Family Medicine

## 2019-12-07 NOTE — Progress Notes (Signed)
[facility] OFFICE PROGRESS NOTE  [name], MD [address] [place] [address]  DIAGNOSIS: Limited stage (T3, N2, M0) small cell lung cancer presented with pulmonary nodules and pleural-based nodules in the right lung in addition to mediastinal lymphadenopathy diagnosed in August 2021. The patient was previously treated without biopsy to a pleural-based nodule in the right lower lobe with SBRT in March 2021 but had evidence for disease recurrence and progression.  PRIOR THERAPY: SBRT to right lower lobe pulmonary nodule in March 2021 under the care of Dr. Sondra Come  CURRENT THERAPY: Systemic chemotherapy with carboplatin for AUC of 5 on day 1 and etoposide 100 mg/M2 on days 1, 2 and 3 with Neulasta support every 3 weeks. First cycle 10/07/2019.Status post 3 cycles  INTERVAL HISTORY: Jonathon [name] 69 y.o. male returns to the clinic today for a follow up visit accompanied by his wife. The patient is feeling fairly well today without any concerning complaints except for an area of tenderness around his right wrist. This area is tender with a "lump". The patient reports that he has had some blood draws performed in this area recently. He has been tolerating treatmentwellwithout any adverse side effects except for the fatigue and low exergy which occurred for about 2-3 days starting a couple days after treatment. Denies any fever, chills, night sweats, or weight loss. The patient is eating well and has a strong appetite. Denies any chest pain, cough, or hemoptysis.He reports dyspnea on exertion when he climbs the stairs.Denies any diarrhea or constipation. He denies any nausea/vomiting. He is here for evaluation before starting cycle #4.     MEDICAL HISTORY: Past Medical History:  Diagnosis Date  . CHF (congestive heart failure) ([place])   . Chronic cough   . COPD, severe ([place])    PT DENIES REGULAR DAILY INHALER ANY PRN  . Coronary artery disease    . CVA (cerebral vascular accident) ([place]) 02/22/2011   LEFT BRAIN STEM PONTINE HEMORRHAGE--  RIGHT SIDED WEAKNESS  . Diabetes mellitus without complication ([place])    type 2  . Dyspnea   . GERD (gastroesophageal reflux disease)   . Harsh voice quality    PT STATES NORMAL FOR HIM  . High cholesterol   . History of CHF (congestive heart failure) MONITORED BY DR BOUSKA   DIASTOLIC  . Hydrocele, left   . Hypertension   . NSTEMI (non-ST elevated myocardial infarction) ([place]) 12/2015  . Pre-operative clearance    GIVEN BY DR Coletta Memos  . Short of breath on exertion   . Smoker   . Weakness of right side of body    S/P CVA   FEB 2013    ALLERGIES:  is allergic to atorvastatin.  MEDICATIONS:  Current Outpatient Medications  Medication Sig Dispense Refill  . albuterol (PROVENTIL) (2.5 MG/3ML) 0.083% nebulizer solution Take 2.5 mg by nebulization every 4 (four) hours as needed for wheezing or shortness of breath.    . Albuterol Sulfate (PROAIR RESPICLICK) 097 (90 BASE) MCG/ACT AEPB Inhale 2 puffs into the lungs 4 (four) times daily as needed. 1 each 5  . amLODipine (NORVASC) 10 MG tablet Take 10 mg by mouth every morning.     Marland Kitchen atorvastatin (LIPITOR) 20 MG tablet Take 20 mg by mouth daily.    . bisoprolol (ZEBETA) 5 MG tablet Take 1 tablet (5 mg total) by mouth daily. 30 tablet 0  . budesonide-formoterol (SYMBICORT) 160-4.5 MCG/ACT inhaler Inhale 2 puffs into the lungs 2 (  two) times daily.    . cloNIDine (CATAPRES) 0.3 MG tablet Take 0.3 mg by mouth 2 (two) times daily.    . Cyanocobalamin (VITAMIN B 12 PO) Take 1 tablet by mouth every morning.     . cyclobenzaprine (FLEXERIL) 5 MG tablet Take 5 mg by mouth at bedtime.     . dabigatran (PRADAXA) 150 MG CAPS capsule Take 150 mg by mouth 2 (two) times daily.    . DULoxetine (CYMBALTA) 30 MG capsule Take 30 mg by mouth every morning.     . gabapentin (NEURONTIN) 400 MG capsule Take 400 mg by mouth 3 (three) times daily.     Marland Kitchen losartan (COZAAR)  100 MG tablet Take 1 tablet (100 mg total) by mouth daily. 90 tablet 3  . metFORMIN (GLUCOPHAGE) 500 MG tablet Take 500 mg by mouth daily with breakfast.    . nitroGLYCERIN (NITROSTAT) 0.4 MG SL tablet Place 0.4 mg under the tongue every 5 (five) minutes as needed for chest pain.    . nortriptyline (PAMELOR) 25 MG capsule Take 25 mg by mouth at bedtime.    . pantoprazole (PROTONIX) 20 MG tablet TAKE 1 TABLET BY MOUTH DAILY 90 tablet 2  . prochlorperazine (COMPAZINE) 10 MG tablet Take 1 tablet (10 mg total) by mouth every 6 (six) hours as needed for nausea or vomiting. 30 tablet 0  . tamsulosin (FLOMAX) 0.4 MG CAPS capsule Take 0.4 mg by mouth daily.    . Tiotropium Bromide Monohydrate (SPIRIVA RESPIMAT) 2.5 MCG/ACT AERS Inhale 2 puffs into the lungs daily. 4 g 5   No current facility-administered medications for this visit.    SURGICAL HISTORY:  Past Surgical History:  Procedure Laterality Date  . ABDOMINAL ANGIOGRAM  09/04/2012   Procedure: ABDOMINAL ANGIOGRAM;  Surgeon: Laverda Page, MD;  Location: [facility] CATH LAB;  Service: Cardiovascular;;  . CARDIAC CATHETERIZATION  05-02-2011  DR Johnsie Cancel   MILD NONOBSTRUCTIVE CAD/ LAD, OM , AND D1/ EF 60^  . CARDIAC CATHETERIZATION  AUG 2000   NON-OBSTRUTIVE CAD/ EF 68%/ 25% PROXIMAL LAD/ 20% MID CIRCUMFLEX  . CARDIAC CATHETERIZATION [name] 12/23/2015   Procedure: Left Heart Cath and Coronary Angiography;  Surgeon: Adrian Prows, MD;  Location: [facility] CV LAB;  Service: Cardiovascular;  Laterality: [name];  . CARDIAC CATHETERIZATION [name] 12/24/2015   Procedure: Coronary Stent Intervention;  Surgeon: Adrian Prows, MD;  Location: [facility] CV LAB;  Service: Cardiovascular;  Laterality: [name];  . CORONARY ANGIOPLASTY    . CORONARY STENT PLACEMENT  12/24/2015    PTCA and stenting of the distal RCA   . HYDROCELE EXCISION Left 04/24/2012   Procedure: HYDROCELECTOMY ADULT;  Surgeon: Fredricka Bonine, MD;  Location: [facility];  Service: Urology;   Laterality: Left;  90 mins req for this case     . [place]  . LEFT AND RIGHT HEART CATHETERIZATION WITH CORONARY ANGIOGRAM [name] 09/04/2012   Procedure: LEFT AND RIGHT HEART CATHETERIZATION WITH CORONARY ANGIOGRAM;  Surgeon: Laverda Page, MD;  Location: [facility] CATH LAB;  Service: Cardiovascular;  Laterality: [name];  . LEFT HEART CATHETERIZATION WITH CORONARY ANGIOGRAM [name] 05/02/2011   Procedure: LEFT HEART CATHETERIZATION WITH CORONARY ANGIOGRAM;  Surgeon: Josue Hector, MD;  Location: [facility] CATH LAB;  Service: Cardiovascular;  Laterality: [name];  . THORACOTOMY/LOBECTOMY Right 03-06-2008   AND STAPLING OF BLEBS FOR SPONTANOUS PNEUMOTHORAX  . TRANSTHORACIC ECHOCARDIOGRAM  02-17-2011   MODERATE LVH/ LVSF NORMAL/ EF 95-63%/ GRADE I DIASTOLIC DYSFUNCTION  REVIEW OF SYSTEMS:   Review of Systems  Constitutional: Positive for fatigue. Negative for appetite change, chills, fever and unexpected weight change.  HENT: Negative for mouth sores, nosebleeds, sore throat and trouble swallowing.   Eyes: Negative for eye problems and icterus.  Respiratory: Positive for mild dyspnea on exertion. Negative for cough, hemoptysis, and wheezing.   Cardiovascular: Negative for chest pain and leg swelling.  Gastrointestinal: Negative for abdominal pain, constipation, diarrhea, nausea and vomiting.  Genitourinary: Negative for bladder incontinence, difficulty urinating, dysuria, frequency and hematuria.   Musculoskeletal: Negative for back pain, gait problem, neck pain and neck stiffness.  Skin: Positive for lump on the right wrist. Negative for itching and rash.  Neurological: Negative for dizziness, extremity weakness, gait problem, headaches, light-headedness and seizures.  Hematological: Negative for adenopathy. Does not bruise/bleed easily.  Psychiatric/Behavioral: Negative for confusion, depression and sleep disturbance. The patient is not nervous/anxious.     PHYSICAL EXAMINATION:  There  were no vitals taken for this visit.  ECOG PERFORMANCE STATUS: 1 - Symptomatic but completely ambulatory  Physical Exam  Constitutional: Oriented to person, place, and time and well-developed, well-nourished, and in no distress.  HENT:  Head: Normocephalic and atraumatic.  Mouth/Throat: Oropharynx is clear and moist. No oropharyngeal exudate.  Eyes: Conjunctivae are normal. Right eye exhibits no discharge. Left eye exhibits no discharge. No scleral icterus.  Neck: Normal range of motion. Neck supple.  Cardiovascular: Normal rate, regular rhythm, normal heart sounds and intact distal pulses.   Pulmonary/Chest: Effort normal and breath sounds normal. No respiratory distress. No wheezes. No rales.  Abdominal: Soft. Bowel sounds are normal. Exhibits no distension and no mass. There is no tenderness.  Musculoskeletal: Normal range of motion. Exhibits no edema.  Lymphadenopathy:    No cervical adenopathy.  Neurological: Alert and oriented to person, place, and time. Exhibits normal muscle tone. Ambulates with a cane.  Skin: Skin is warm and dry. Tenderness and localized lump on right wrist. Not diaphoretic. No pallor.  Psychiatric: Mood, memory and judgment normal.  Vitals reviewed.  LABORATORY DATA: Lab Results  Component Value Date   WBC 13.0 (H) 12/02/2019   HGB 11.7 (L) 12/02/2019   HCT 35.7 (L) 12/02/2019   MCV 84.0 12/02/2019   PLT 110 (L) 12/02/2019      Chemistry      Component Value Date/Time   NA 135 12/02/2019 1404   K 3.3 (L) 12/02/2019 1404   CL 98 12/02/2019 1404   CO2 27 12/02/2019 1404   BUN 7 (L) 12/02/2019 1404   CREATININE 0.99 12/02/2019 1404      Component Value Date/Time   CALCIUM 9.0 12/02/2019 1404   ALKPHOS 122 12/02/2019 1404   AST 9 (L) 12/02/2019 1404   ALT 13 12/02/2019 1404   BILITOT 0.4 12/02/2019 1404       RADIOGRAPHIC STUDIES:  CT Chest W Contrast  Result Date: 11/15/2019 CLINICAL DATA:  Small cell lung cancer.  Restaging. EXAM: CT  CHEST WITH CONTRAST TECHNIQUE: Multidetector CT imaging of the chest was performed during intravenous contrast administration. CONTRAST:  48mL OMNIPAQUE IOHEXOL 300 MG/ML  SOLN COMPARISON:  PET-CT 08/19/2019 FINDINGS: Cardiovascular: Normal heart size. No pericardial effusion identified. Aortic atherosclerosis. Coronary artery calcifications. Mediastinum/Nodes: No enlarged supraclavicular or axillary lymph nodes. High right paratracheal lymph node measures 0.9 cm, image 41/2. Unchanged from previous exam. Right hilar lymph node measures 1.5 cm, image 97/2. Previously 1.7 cm. Lungs/Pleura: No pleural effusion identified. Right posteromedial diaphragmatic hernia is identified containing fat only.  Unchanged. Advanced changes of centrilobular and paraseptal emphysema with bullous changes. Non FDG avid, subpleural nodule within the paramediastinal aspect of the lingula measures 2.3 x 1.7 cm, image 113/7. Previously 2.2 x 1.8 cm. Previous FDG avid subpleural nodule within the right lower lung measures 1.3 x 0.6 cm, image 113/7. Previously 1.8 x 1.2 cm. 6 mm nodule within the right upper lungs measures 0.6 cm, image 51/7. Previously 0.8 cm. The right juxta hilar lung nodule measures 2.1 x 1.6 cm, image 97/7. Previously 3.0 x 2.8 cm. Upper Abdomen: No acute abnormality noted within the imaged portions of the upper abdomen. The adrenal glands are unremarkable. Left upper pole kidney cyst measures 2.7 cm, image 178/2. Musculoskeletal: No acute or suspicious osseous findings. IMPRESSION: 1. Interval response to therapy. The right juxta hilar lung nodule has decreased in size in the interval. The subpleural nodule within the periphery of the right lower lobe lung is also decreased in size in the interval. The tiny nodule within the right upper lung is stable to mildly decreased in the interval. 2. Stable to mild decrease in size of right hilar and right paratracheal lymph nodes. 3. Emphysema and aortic atherosclerosis. 4.  Coronary artery calcifications. Aortic Atherosclerosis (ICD10-I70.0) and Emphysema (ICD10-J43.9). Electronically Signed   By: Kerby Moors [name]   On: 11/15/2019 08:55     ASSESSMENT/PLAN:  This is a very pleasant 69 year old male recently diagnosed with limited stage small cell lung cancer (T3, N2, M0) presented with pleural-based pulmonary nodules in the right lung in addition to mediastinal lymphadenopathy in August 2021. The patient was initially diagnosed with a pleural-based nodule status post SBRT in March 2021.  He is currently undergoingsystemic chemotherapy with carboplatin for AUC of 5 from day 1 and etoposide 100 mg/M2 on days 1, 2 and 3 with Neulasta support every 3 weeks.He is status post 3 cycles and tolerated itwell except for the fatigue.  Labs were reviewed. Recommend that he proceed with the last cycle of treatment, cycle #4 today as scheduled.   We will arrange for a restaging CT scan of the chest in 3-4 weeks.   We will see him in 3-4 weeks for evaluation and to review his scan.   For the painful lump on his wrist, this may be infected. I will call in keflex 500 mg 4x daily for 5 days to his pharmacy.   His potassium is slightly low, he was encouraged increase his intake of potassium rich food.   The patient was advised to call immediately if he has any concerning symptoms in the interval. The patient voices understanding of current disease status and treatment options and is in agreement with the current care plan. All questions were answered. The patient knows to call the clinic with any problems, questions or concerns. We can certainly see the patient much sooner if necessary       No orders of the defined types were placed in this encounter.    Athina Fahey L Josephene Marrone, PA-C 12/07/19

## 2019-12-09 ENCOUNTER — Inpatient Hospital Stay (HOSPITAL_BASED_OUTPATIENT_CLINIC_OR_DEPARTMENT_OTHER): Payer: Medicare Other | Admitting: Physician Assistant

## 2019-12-09 ENCOUNTER — Other Ambulatory Visit: Payer: Self-pay

## 2019-12-09 ENCOUNTER — Other Ambulatory Visit: Payer: Self-pay | Admitting: Physician Assistant

## 2019-12-09 ENCOUNTER — Encounter: Payer: Self-pay | Admitting: Physician Assistant

## 2019-12-09 ENCOUNTER — Inpatient Hospital Stay: Payer: Medicare Other

## 2019-12-09 VITALS — BP 96/56 | HR 56 | Temp 98.8°F | Resp 13 | Ht 72.0 in | Wt 201.9 lb

## 2019-12-09 DIAGNOSIS — L089 Local infection of the skin and subcutaneous tissue, unspecified: Secondary | ICD-10-CM | POA: Diagnosis not present

## 2019-12-09 DIAGNOSIS — C3491 Malignant neoplasm of unspecified part of right bronchus or lung: Secondary | ICD-10-CM

## 2019-12-09 DIAGNOSIS — C3490 Malignant neoplasm of unspecified part of unspecified bronchus or lung: Secondary | ICD-10-CM

## 2019-12-09 DIAGNOSIS — Z5111 Encounter for antineoplastic chemotherapy: Secondary | ICD-10-CM | POA: Diagnosis not present

## 2019-12-09 LAB — CMP (CANCER CENTER ONLY)
ALT: 9 U/L (ref 0–44)
AST: 10 U/L — ABNORMAL LOW (ref 15–41)
Albumin: 3.6 g/dL (ref 3.5–5.0)
Alkaline Phosphatase: 99 U/L (ref 38–126)
Anion gap: 9 (ref 5–15)
BUN: 8 mg/dL (ref 8–23)
CO2: 29 mmol/L (ref 22–32)
Calcium: 9.4 mg/dL (ref 8.9–10.3)
Chloride: 97 mmol/L — ABNORMAL LOW (ref 98–111)
Creatinine: 1.01 mg/dL (ref 0.61–1.24)
GFR, Estimated: >60 mL/min (ref >60)
Glucose, Bld: 132 mg/dL — ABNORMAL HIGH (ref 70–99)
Potassium: 3.3 mmol/L — ABNORMAL LOW (ref 3.5–5.1)
Sodium: 135 mmol/L (ref 135–145)
Total Bilirubin: 0.3 mg/dL (ref 0.3–1.2)
Total Protein: 7.7 g/dL (ref 6.5–8.1)

## 2019-12-09 LAB — CBC WITH DIFFERENTIAL (CANCER CENTER ONLY)
Abs Immature Granulocytes: 0.03 10*3/uL (ref 0.00–0.07)
Basophils Absolute: 0 10*3/uL (ref 0.0–0.1)
Basophils Relative: 1 %
Eosinophils Absolute: 0 10*3/uL (ref 0.0–0.5)
Eosinophils Relative: 0 %
HCT: 38.5 % — ABNORMAL LOW (ref 39.0–52.0)
Hemoglobin: 12.4 g/dL — ABNORMAL LOW (ref 13.0–17.0)
Immature Granulocytes: 0 %
Lymphocytes Relative: 23 %
Lymphs Abs: 2 10*3/uL (ref 0.7–4.0)
MCH: 27.4 pg (ref 26.0–34.0)
MCHC: 32.2 g/dL (ref 30.0–36.0)
MCV: 85 fL (ref 80.0–100.0)
Monocytes Absolute: 1 10*3/uL (ref 0.1–1.0)
Monocytes Relative: 11 %
Neutro Abs: 5.7 10*3/uL (ref 1.7–7.7)
Neutrophils Relative %: 65 %
Platelet Count: 341 10*3/uL (ref 150–400)
RBC: 4.53 MIL/uL (ref 4.22–5.81)
RDW: 20.2 % — ABNORMAL HIGH (ref 11.5–15.5)
WBC Count: 8.7 10*3/uL (ref 4.0–10.5)
nRBC: 0 % (ref 0.0–0.2)

## 2019-12-09 MED ORDER — PALONOSETRON HCL INJECTION 0.25 MG/5ML
INTRAVENOUS | Status: AC
  Filled 2019-12-09: qty 5

## 2019-12-09 MED ORDER — SODIUM CHLORIDE 0.9 % IV SOLN
150.0000 mg | Freq: Once | INTRAVENOUS | Status: AC
  Administered 2019-12-09: 150 mg via INTRAVENOUS
  Filled 2019-12-09: qty 150

## 2019-12-09 MED ORDER — SODIUM CHLORIDE 0.9 % IV SOLN
Freq: Once | INTRAVENOUS | Status: AC
  Filled 2019-12-09: qty 250

## 2019-12-09 MED ORDER — CEPHALEXIN 500 MG PO CAPS: 500.0000 mg | ORAL_CAPSULE | Freq: Four times a day (QID) | ORAL | 0 refills | Status: DC

## 2019-12-09 MED ORDER — PALONOSETRON HCL INJECTION 0.25 MG/5ML
0.2500 mg | Freq: Once | INTRAVENOUS | Status: AC
  Administered 2019-12-09: 0.25 mg via INTRAVENOUS

## 2019-12-09 MED ORDER — SODIUM CHLORIDE 0.9 % IV SOLN
562.0000 mg | Freq: Once | INTRAVENOUS | Status: AC
  Administered 2019-12-09: 560 mg via INTRAVENOUS
  Filled 2019-12-09: qty 56

## 2019-12-09 MED ORDER — SODIUM CHLORIDE 0.9 % IV SOLN
100.0000 mg/m2 | Freq: Once | INTRAVENOUS | Status: AC
  Administered 2019-12-09: 220 mg via INTRAVENOUS
  Filled 2019-12-09: qty 11

## 2019-12-09 MED ORDER — SODIUM CHLORIDE 0.9 % IV SOLN
10.0000 mg | Freq: Once | INTRAVENOUS | Status: AC
  Administered 2019-12-09: 10 mg via INTRAVENOUS
  Filled 2019-12-09: qty 10

## 2019-12-09 NOTE — Patient Instructions (Signed)
[facility] Discharge Instructions for Patients Receiving Chemotherapy  Today you received the following chemotherapy agents: carboplatin/etoposide.  To help prevent nausea and vomiting after your treatment, we encourage you to take your nausea medication as directed.  If you develop nausea and vomiting that is not controlled by your nausea medication, call the clinic.   BELOW ARE SYMPTOMS THAT SHOULD BE REPORTED IMMEDIATELY:  *FEVER GREATER THAN 100.5 F  *CHILLS WITH OR WITHOUT FEVER  NAUSEA AND VOMITING THAT IS NOT CONTROLLED WITH YOUR NAUSEA MEDICATION  *UNUSUAL SHORTNESS OF BREATH  *UNUSUAL BRUISING OR BLEEDING  TENDERNESS IN MOUTH AND THROAT WITH OR WITHOUT PRESENCE OF ULCERS  *URINARY PROBLEMS  *BOWEL PROBLEMS  UNUSUAL RASH Items with * indicate a potential emergency and should be followed up as soon as possible.  Feel free to call the clinic should you have any questions or concerns. The clinic phone number is [phone].  Please show the [place] at check-in to the Emergency Department and triage nurse.

## 2019-12-10 ENCOUNTER — Inpatient Hospital Stay: Payer: Medicare Other

## 2019-12-10 ENCOUNTER — Other Ambulatory Visit: Payer: Self-pay

## 2019-12-10 VITALS — BP 137/71 | HR 68 | Temp 98.4°F | Resp 17

## 2019-12-10 DIAGNOSIS — Z5111 Encounter for antineoplastic chemotherapy: Secondary | ICD-10-CM | POA: Diagnosis not present

## 2019-12-10 DIAGNOSIS — C3491 Malignant neoplasm of unspecified part of right bronchus or lung: Secondary | ICD-10-CM

## 2019-12-10 MED ORDER — SODIUM CHLORIDE 0.9 % IV SOLN
100.0000 mg/m2 | Freq: Once | INTRAVENOUS | Status: AC
  Administered 2019-12-10: 220 mg via INTRAVENOUS
  Filled 2019-12-10: qty 11

## 2019-12-10 MED ORDER — SODIUM CHLORIDE 0.9 % IV SOLN
10.0000 mg | Freq: Once | INTRAVENOUS | Status: AC
  Administered 2019-12-10: 10 mg via INTRAVENOUS
  Filled 2019-12-10: qty 10

## 2019-12-10 MED ORDER — SODIUM CHLORIDE 0.9 % IV SOLN
Freq: Once | INTRAVENOUS | Status: AC
  Filled 2019-12-10: qty 250

## 2019-12-10 NOTE — Patient Instructions (Signed)
[facility] Discharge Instructions for Patients Receiving Chemotherapy  Today you received the following chemotherapy agents: etoposide  To help prevent nausea and vomiting after your treatment, we encourage you to take your nausea medication as directed.   If you develop nausea and vomiting that is not controlled by your nausea medication, call the clinic.   BELOW ARE SYMPTOMS THAT SHOULD BE REPORTED IMMEDIATELY:  *FEVER GREATER THAN 100.5 F  *CHILLS WITH OR WITHOUT FEVER  NAUSEA AND VOMITING THAT IS NOT CONTROLLED WITH YOUR NAUSEA MEDICATION  *UNUSUAL SHORTNESS OF BREATH  *UNUSUAL BRUISING OR BLEEDING  TENDERNESS IN MOUTH AND THROAT WITH OR WITHOUT PRESENCE OF ULCERS  *URINARY PROBLEMS  *BOWEL PROBLEMS  UNUSUAL RASH Items with * indicate a potential emergency and should be followed up as soon as possible.  Feel free to call the clinic should you have any questions or concerns. The clinic phone number is [phone].  Please show the CHEMO ALERT CARD at check-in to the Emergency Department and triage nurse.   

## 2019-12-11 ENCOUNTER — Inpatient Hospital Stay: Payer: Medicare Other

## 2019-12-11 ENCOUNTER — Telehealth: Payer: Self-pay | Admitting: Physician Assistant

## 2019-12-11 ENCOUNTER — Other Ambulatory Visit: Payer: Self-pay

## 2019-12-11 VITALS — BP 134/78 | HR 56 | Temp 98.5°F | Resp 16

## 2019-12-11 DIAGNOSIS — C3491 Malignant neoplasm of unspecified part of right bronchus or lung: Secondary | ICD-10-CM

## 2019-12-11 DIAGNOSIS — Z5111 Encounter for antineoplastic chemotherapy: Secondary | ICD-10-CM | POA: Diagnosis not present

## 2019-12-11 MED ORDER — SODIUM CHLORIDE 0.9 % IV SOLN
Freq: Once | INTRAVENOUS | Status: AC
  Filled 2019-12-11: qty 250

## 2019-12-11 MED ORDER — SODIUM CHLORIDE 0.9 % IV SOLN
10.0000 mg | Freq: Once | INTRAVENOUS | Status: AC
  Administered 2019-12-11: 10 mg via INTRAVENOUS
  Filled 2019-12-11: qty 10

## 2019-12-11 MED ORDER — SODIUM CHLORIDE 0.9 % IV SOLN
100.0000 mg/m2 | Freq: Once | INTRAVENOUS | Status: AC
  Administered 2019-12-11: 220 mg via INTRAVENOUS
  Filled 2019-12-11: qty 11

## 2019-12-11 NOTE — Telephone Encounter (Signed)
Scheduled per los. Called and left msg. Will give printout at todays infusion appt

## 2019-12-11 NOTE — Patient Instructions (Signed)
[facility] Discharge Instructions for Patients Receiving Chemotherapy  Today you received the following chemotherapy agents: etoposide  To help prevent nausea and vomiting after your treatment, we encourage you to take your nausea medication as directed.   If you develop nausea and vomiting that is not controlled by your nausea medication, call the clinic.   BELOW ARE SYMPTOMS THAT SHOULD BE REPORTED IMMEDIATELY:  *FEVER GREATER THAN 100.5 F  *CHILLS WITH OR WITHOUT FEVER  NAUSEA AND VOMITING THAT IS NOT CONTROLLED WITH YOUR NAUSEA MEDICATION  *UNUSUAL SHORTNESS OF BREATH  *UNUSUAL BRUISING OR BLEEDING  TENDERNESS IN MOUTH AND THROAT WITH OR WITHOUT PRESENCE OF ULCERS  *URINARY PROBLEMS  *BOWEL PROBLEMS  UNUSUAL RASH Items with * indicate a potential emergency and should be followed up as soon as possible.  Feel free to call the clinic should you have any questions or concerns. The clinic phone number is [phone].  Please show the CHEMO ALERT CARD at check-in to the Emergency Department and triage nurse.   

## 2019-12-13 ENCOUNTER — Other Ambulatory Visit: Payer: Self-pay

## 2019-12-13 ENCOUNTER — Inpatient Hospital Stay: Payer: Medicare Other

## 2019-12-13 VITALS — BP 134/66 | Temp 97.8°F | Resp 18

## 2019-12-13 DIAGNOSIS — Z5111 Encounter for antineoplastic chemotherapy: Secondary | ICD-10-CM | POA: Diagnosis not present

## 2019-12-13 DIAGNOSIS — C3491 Malignant neoplasm of unspecified part of right bronchus or lung: Secondary | ICD-10-CM

## 2019-12-13 MED ORDER — PEGFILGRASTIM-CBQV 6 MG/0.6ML ~~LOC~~ SOSY
PREFILLED_SYRINGE | SUBCUTANEOUS | Status: AC
  Filled 2019-12-13: qty 0.6

## 2019-12-13 MED ORDER — PEGFILGRASTIM-CBQV 6 MG/0.6ML ~~LOC~~ SOSY
6.0000 mg | PREFILLED_SYRINGE | Freq: Once | SUBCUTANEOUS | Status: AC
  Administered 2019-12-13: 6 mg via SUBCUTANEOUS

## 2019-12-13 NOTE — Patient Instructions (Signed)

## 2019-12-16 ENCOUNTER — Inpatient Hospital Stay: Payer: Medicare Other

## 2019-12-16 ENCOUNTER — Other Ambulatory Visit: Payer: Self-pay

## 2019-12-16 DIAGNOSIS — Z5111 Encounter for antineoplastic chemotherapy: Secondary | ICD-10-CM | POA: Diagnosis not present

## 2019-12-16 DIAGNOSIS — C3491 Malignant neoplasm of unspecified part of right bronchus or lung: Secondary | ICD-10-CM

## 2019-12-16 LAB — CMP (CANCER CENTER ONLY)
ALT: 11 U/L (ref 0–44)
AST: 9 U/L — ABNORMAL LOW (ref 15–41)
Albumin: 3.4 g/dL — ABNORMAL LOW (ref 3.5–5.0)
Alkaline Phosphatase: 110 U/L (ref 38–126)
Anion gap: 10 (ref 5–15)
BUN: 18 mg/dL (ref 8–23)
CO2: 28 mmol/L (ref 22–32)
Calcium: 9.1 mg/dL (ref 8.9–10.3)
Chloride: 96 mmol/L — ABNORMAL LOW (ref 98–111)
Creatinine: 0.76 mg/dL (ref 0.61–1.24)
GFR, Estimated: >60 mL/min (ref >60)
Glucose, Bld: 143 mg/dL — ABNORMAL HIGH (ref 70–99)
Potassium: 3.3 mmol/L — ABNORMAL LOW (ref 3.5–5.1)
Sodium: 134 mmol/L — ABNORMAL LOW (ref 135–145)
Total Bilirubin: 0.9 mg/dL (ref 0.3–1.2)
Total Protein: 6.5 g/dL (ref 6.5–8.1)

## 2019-12-16 LAB — CBC WITH DIFFERENTIAL (CANCER CENTER ONLY)
Abs Immature Granulocytes: 0 10*3/uL (ref 0.00–0.07)
Basophils Absolute: 0 10*3/uL (ref 0.0–0.1)
Basophils Relative: 0 %
Eosinophils Absolute: 0 10*3/uL (ref 0.0–0.5)
Eosinophils Relative: 0 %
HCT: 31.9 % — ABNORMAL LOW (ref 39.0–52.0)
Hemoglobin: 10.7 g/dL — ABNORMAL LOW (ref 13.0–17.0)
Lymphocytes Relative: 7 %
Lymphs Abs: 1.7 10*3/uL (ref 0.7–4.0)
MCH: 28.2 pg (ref 26.0–34.0)
MCHC: 33.5 g/dL (ref 30.0–36.0)
MCV: 83.9 fL (ref 80.0–100.0)
Monocytes Absolute: 0.2 10*3/uL (ref 0.1–1.0)
Monocytes Relative: 1 %
Neutro Abs: 22.2 10*3/uL — ABNORMAL HIGH (ref 1.7–7.7)
Neutrophils Relative %: 92 %
Platelet Count: 169 10*3/uL (ref 150–400)
RBC: 3.8 MIL/uL — ABNORMAL LOW (ref 4.22–5.81)
RDW: 19.1 % — ABNORMAL HIGH (ref 11.5–15.5)
WBC Count: 24.1 10*3/uL — ABNORMAL HIGH (ref 4.0–10.5)
nRBC: 0 % (ref 0.0–0.2)

## 2019-12-23 ENCOUNTER — Inpatient Hospital Stay: Payer: Medicare Other | Attending: Physician Assistant

## 2019-12-23 ENCOUNTER — Other Ambulatory Visit: Payer: Self-pay

## 2019-12-23 DIAGNOSIS — L02511 Cutaneous abscess of right hand: Secondary | ICD-10-CM | POA: Diagnosis not present

## 2019-12-23 DIAGNOSIS — J449 Chronic obstructive pulmonary disease, unspecified: Secondary | ICD-10-CM | POA: Diagnosis not present

## 2019-12-23 DIAGNOSIS — Z923 Personal history of irradiation: Secondary | ICD-10-CM | POA: Diagnosis not present

## 2019-12-23 DIAGNOSIS — I509 Heart failure, unspecified: Secondary | ICD-10-CM | POA: Insufficient documentation

## 2019-12-23 DIAGNOSIS — C3491 Malignant neoplasm of unspecified part of right bronchus or lung: Secondary | ICD-10-CM

## 2019-12-23 DIAGNOSIS — I252 Old myocardial infarction: Secondary | ICD-10-CM | POA: Insufficient documentation

## 2019-12-23 DIAGNOSIS — Z8673 Personal history of transient ischemic attack (TIA), and cerebral infarction without residual deficits: Secondary | ICD-10-CM | POA: Diagnosis not present

## 2019-12-23 DIAGNOSIS — K219 Gastro-esophageal reflux disease without esophagitis: Secondary | ICD-10-CM | POA: Insufficient documentation

## 2019-12-23 DIAGNOSIS — Z7901 Long term (current) use of anticoagulants: Secondary | ICD-10-CM | POA: Diagnosis not present

## 2019-12-23 DIAGNOSIS — Z79899 Other long term (current) drug therapy: Secondary | ICD-10-CM | POA: Insufficient documentation

## 2019-12-23 DIAGNOSIS — E119 Type 2 diabetes mellitus without complications: Secondary | ICD-10-CM | POA: Diagnosis not present

## 2019-12-23 DIAGNOSIS — I2510 Atherosclerotic heart disease of native coronary artery without angina pectoris: Secondary | ICD-10-CM | POA: Diagnosis not present

## 2019-12-23 DIAGNOSIS — Z7984 Long term (current) use of oral hypoglycemic drugs: Secondary | ICD-10-CM | POA: Insufficient documentation

## 2019-12-23 DIAGNOSIS — I110 Hypertensive heart disease with heart failure: Secondary | ICD-10-CM | POA: Diagnosis not present

## 2019-12-23 DIAGNOSIS — R0602 Shortness of breath: Secondary | ICD-10-CM | POA: Insufficient documentation

## 2019-12-23 DIAGNOSIS — Z9221 Personal history of antineoplastic chemotherapy: Secondary | ICD-10-CM | POA: Diagnosis not present

## 2019-12-23 LAB — CBC WITH DIFFERENTIAL (CANCER CENTER ONLY)
Abs Immature Granulocytes: 0.17 10*3/uL — ABNORMAL HIGH (ref 0.00–0.07)
Basophils Absolute: 0.1 10*3/uL (ref 0.0–0.1)
Basophils Relative: 1 %
Eosinophils Absolute: 0 10*3/uL (ref 0.0–0.5)
Eosinophils Relative: 1 %
HCT: 34.7 % — ABNORMAL LOW (ref 39.0–52.0)
Hemoglobin: 11.1 g/dL — ABNORMAL LOW (ref 13.0–17.0)
Immature Granulocytes: 3 %
Lymphocytes Relative: 30 %
Lymphs Abs: 2.1 10*3/uL (ref 0.7–4.0)
MCH: 27.9 pg (ref 26.0–34.0)
MCHC: 32 g/dL (ref 30.0–36.0)
MCV: 87.2 fL (ref 80.0–100.0)
Monocytes Absolute: 0.7 10*3/uL (ref 0.1–1.0)
Monocytes Relative: 10 %
Neutro Abs: 3.8 10*3/uL (ref 1.7–7.7)
Neutrophils Relative %: 55 %
Platelet Count: 96 10*3/uL — ABNORMAL LOW (ref 150–400)
RBC: 3.98 MIL/uL — ABNORMAL LOW (ref 4.22–5.81)
RDW: 19.9 % — ABNORMAL HIGH (ref 11.5–15.5)
WBC Count: 6.8 10*3/uL (ref 4.0–10.5)
nRBC: 0 % (ref 0.0–0.2)

## 2019-12-23 LAB — CMP (CANCER CENTER ONLY)
ALT: 16 U/L (ref 0–44)
AST: 13 U/L — ABNORMAL LOW (ref 15–41)
Albumin: 3.5 g/dL (ref 3.5–5.0)
Alkaline Phosphatase: 111 U/L (ref 38–126)
Anion gap: 6 (ref 5–15)
BUN: 7 mg/dL — ABNORMAL LOW (ref 8–23)
CO2: 30 mmol/L (ref 22–32)
Calcium: 9.3 mg/dL (ref 8.9–10.3)
Chloride: 98 mmol/L (ref 98–111)
Creatinine: 0.95 mg/dL (ref 0.61–1.24)
GFR, Estimated: >60 mL/min (ref >60)
Glucose, Bld: 139 mg/dL — ABNORMAL HIGH (ref 70–99)
Potassium: 3.6 mmol/L (ref 3.5–5.1)
Sodium: 134 mmol/L — ABNORMAL LOW (ref 135–145)
Total Bilirubin: 0.3 mg/dL (ref 0.3–1.2)
Total Protein: 7.2 g/dL (ref 6.5–8.1)

## 2019-12-24 ENCOUNTER — Other Ambulatory Visit: Payer: Self-pay | Admitting: Medical

## 2019-12-24 ENCOUNTER — Inpatient Hospital Stay (HOSPITAL_BASED_OUTPATIENT_CLINIC_OR_DEPARTMENT_OTHER): Payer: Medicare Other | Admitting: Medical

## 2019-12-24 ENCOUNTER — Other Ambulatory Visit: Payer: Self-pay

## 2019-12-24 VITALS — BP 135/72 | HR 70 | Temp 97.4°F | Resp 17 | Ht 72.0 in | Wt 201.7 lb

## 2019-12-24 DIAGNOSIS — L0291 Cutaneous abscess, unspecified: Secondary | ICD-10-CM

## 2019-12-24 DIAGNOSIS — C3491 Malignant neoplasm of unspecified part of right bronchus or lung: Secondary | ICD-10-CM | POA: Diagnosis not present

## 2019-12-24 MED ORDER — MUPIROCIN CALCIUM 2 % EX CREA: 1.0000 "application " | TOPICAL_CREAM | Freq: Two times a day (BID) | CUTANEOUS | 0 refills | Status: DC

## 2019-12-24 MED ORDER — SULFAMETHOXAZOLE-TRIMETHOPRIM 800-160 MG PO TABS: 1.0000 | ORAL_TABLET | Freq: Two times a day (BID) | ORAL | 0 refills | Status: DC

## 2019-12-24 NOTE — Progress Notes (Signed)
Patient assessed by Sandi Mealy, PA.

## 2019-12-25 NOTE — Progress Notes (Signed)
[facility] Progress Note   Jonathon [name] [id number] [birth year] 69 y.o.  Jonathon [name] is managed by Dr. Fanny Bien. Jonathon [name]  Actively treated with chemotherapy/immunotherapy/hormonal therapy: yes  Current therapy: Carboplatin and etoposide with Udenyca support  Last treated: 12/09/2019 (cycle 4)  Next scheduled appointment with provider: 12/30/2019  Assessment: Plan:    Abscess - Plan: sulfamethoxazole-trimethoprim (BACTRIM DS) 800-160 MG tablet, mupirocin cream (BACTROBAN) 2 %  Primary lung small cell carcinoma, right (HCC)   Abscess of the right dorsal hand: The patient just completed Keflex but continues to have an area that appears to be a small abscess at an IV site in his right dorsal hand.  He was given a prescription for Bactrim DS p.o. twice daily and Bactroban.   Please see After Visit Summary for patient specific instructions.  Future Appointments  Date Time Provider [place]  12/30/2019 10:15 AM [facility] None  12/30/2019 10:45 AM [name], MD [facility] None  01/01/2020  4:00 PM WL-CT 2 WL-CT [place]  03/05/2020  2:30 PM Adrian Prows, MD PCV-PCV None    No orders of the defined types were placed in this encounter.      Subjective:   Patient ID:  Jonathon [name] is a 69 y.o. (DOB [birth year]) male.  Chief Complaint: No chief complaint on file.   HPI Jonathon [name]  is a 69 y.o. male with a diagnosis of a limited stage small cell carcinoma of the right lung.  He is managed by Dr. Earlie Server and is status post cycle 4 of carboplatin and etoposide with Udenyca support that was dosed on 12/09/2019.  He presents today stating that he has a small abscess on his right dorsal hand at the site of a recent IV placement as part of his most recent chemotherapy.  He has just completed a course of Keflex but continues to have an area of pus on his right dorsal hand.  He denies fevers, chills, or  sweats.  Medications: I have reviewed the patient's current medications.  Allergies:  Allergies  Allergen Reactions  . Atorvastatin Other (See Comments)    Severe constipation    Past Medical History:  Diagnosis Date  . CHF (congestive heart failure) ([place])   . Chronic cough   . COPD, severe ([place])    PT DENIES REGULAR DAILY INHALER ANY PRN  . Coronary artery disease   . CVA (cerebral vascular accident) ([place]) 02/22/2011   LEFT BRAIN STEM PONTINE HEMORRHAGE--  RIGHT SIDED WEAKNESS  . Diabetes mellitus without complication ([place])    type 2  . Dyspnea   . GERD (gastroesophageal reflux disease)   . Harsh voice quality    PT STATES NORMAL FOR HIM  . High cholesterol   . History of CHF (congestive heart failure) MONITORED BY DR BOUSKA   DIASTOLIC  . Hydrocele, left   . Hypertension   . NSTEMI (non-ST elevated myocardial infarction) ([place]) 12/2015  . Pre-operative clearance    GIVEN BY DR Coletta Memos  . Short of breath on exertion   . Smoker   . Weakness of right side of body    S/P CVA   FEB 2013    Past Surgical History:  Procedure Laterality Date  . ABDOMINAL ANGIOGRAM  09/04/2012   Procedure: ABDOMINAL ANGIOGRAM;  Surgeon: Laverda Page, MD;  Location[facility] CATH LAB;  Service: Cardiovascular;;  . CARDIAC CATHETERIZATION  05-02-2011  DR Johnsie Cancel   MILD NONOBSTRUCTIVE CAD/ LAD, OM , AND  D1/ EF 60^  . CARDIAC CATHETERIZATION  AUG 2000   NON-OBSTRUTIVE CAD/ EF 68%/ 25% PROXIMAL LAD/ 20% MID CIRCUMFLEX  . CARDIAC CATHETERIZATION [name] 12/23/2015   Procedure: Left Heart Cath and Coronary Angiography;  Surgeon: Adrian Prows, MD;  Location: [place] CV LAB;  Service: Cardiovascular;  Laterality: [name];  . CARDIAC CATHETERIZATION [name] 12/24/2015   Procedure: Coronary Stent Intervention;  Surgeon: Adrian Prows, MD;  Location: [place] CV LAB;  Service: Cardiovascular;  Laterality: [name];  . CORONARY ANGIOPLASTY    . CORONARY STENT PLACEMENT  12/24/2015    PTCA and stenting of the distal RCA    . HYDROCELE EXCISION Left 04/24/2012   Procedure: HYDROCELECTOMY ADULT;  Surgeon: Fredricka Bonine, MD;  Location: [facility];  Service: Urology;  Laterality: Left;  90 mins req for this case     . [place]  . LEFT AND RIGHT HEART CATHETERIZATION WITH CORONARY ANGIOGRAM [name] 09/04/2012   Procedure: LEFT AND RIGHT HEART CATHETERIZATION WITH CORONARY ANGIOGRAM;  Surgeon: Laverda Page, MD;  Location: [facility] CATH LAB;  Service: Cardiovascular;  Laterality: [name];  . LEFT HEART CATHETERIZATION WITH CORONARY ANGIOGRAM [name] 05/02/2011   Procedure: LEFT HEART CATHETERIZATION WITH CORONARY ANGIOGRAM;  Surgeon: Josue Hector, MD;  Location: [facility] CATH LAB;  Service: Cardiovascular;  Laterality: [name];  . THORACOTOMY/LOBECTOMY Right 03-06-2008   AND STAPLING OF BLEBS FOR SPONTANOUS PNEUMOTHORAX  . TRANSTHORACIC ECHOCARDIOGRAM  02-17-2011   MODERATE LVH/ LVSF NORMAL/ EF 18-84%/ GRADE I DIASTOLIC DYSFUNCTION    Family History  Problem Relation Age of Onset  . Hypertension Mother   . Stroke Mother   . Lung disease Father        died of "colapsed lung" per patient  . Asthma Father   . Diabetes Sister   . Colon cancer Neg Hx   . Stomach cancer Neg Hx     Social History   Socioeconomic History  . Marital status: Married    Spouse name: Not on file  . Number of children: 4  . Years of education: Not on file  . Highest education level: Not on file  Occupational History  . Not on file  Tobacco Use  . Smoking status: Former Smoker    Packs/day: 0.00    Years: 53.00    Pack years: 0.00    Types: Cigarettes    Quit date: 2019    Years since quitting: 2.9  . Smokeless tobacco: Never Used  . Tobacco comment: 3-4 cigarettes daily- 08/30/17  Vaping Use  . Vaping Use: Never used  Substance and Sexual Activity  . Alcohol use: Not Currently    Alcohol/week: 0.0 standard drinks    Comment: 02/10/2016 "nothing in the last 10 years"  . Drug use: No  . Sexual  activity: Not on file  Other Topics Concern  . Not on file  Social History Narrative   4 Children   6 Grandchildren   Social Determinants of Health   Financial Resource Strain:   . Difficulty of Paying Living Expenses: Not on file  Food Insecurity:   . Worried About Charity fundraiser in the Last Year: Not on file  . Ran Out of Food in the Last Year: Not on file  Transportation Needs:   . Lack of Transportation (Medical): Not on file  . Lack of Transportation (Non-Medical): Not on file  Physical Activity:   . Days of Exercise per Week: Not on file  . Minutes of  Exercise per Session: Not on file  Stress:   . Feeling of Stress : Not on file  Social Connections:   . Frequency of Communication with Friends and Family: Not on file  . Frequency of Social Gatherings with Friends and Family: Not on file  . Attends Religious Services: Not on file  . Active Member of Clubs or Organizations: Not on file  . Attends Archivist Meetings: Not on file  . Marital Status: Not on file  Intimate Partner Violence:   . Fear of Current or Ex-Partner: Not on file  . Emotionally Abused: Not on file  . Physically Abused: Not on file  . Sexually Abused: Not on file    Past Medical History, Surgical history, Social history, and Family history were reviewed and updated as appropriate.   Please see review of systems for further details on the patient's review from today.   Review of Systems:  Review of Systems  Constitutional: Negative for chills, diaphoresis and fever.  Skin: Negative for color change and rash.       Small abscess on the right dorsal hand.    Objective:   Physical Exam:  BP 135/72 (BP Location: Left Arm, Patient Position: Sitting)   Pulse 70   Temp (!) 97.4 F (36.3 C) (Tympanic)   Resp 17   Ht 6' (1.829 m)   Wt 201 lb 11.2 oz (91.5 kg)   SpO2 97%   BMI 27.36 kg/m  ECOG: 1  Physical Exam Constitutional:      General: He is not in acute distress.     Appearance: He is not ill-appearing.  HENT:     Head: Normocephalic and atraumatic.  Skin:    Findings: Erythema and lesion present.     Comments: Small pustule on the right dorsal hand.  Neurological:     Mental Status: He is alert.     Coordination: Coordination normal.     Gait: Gait abnormal (The patient is ambulating with the use of a wheelchair.).  Psychiatric:        Mood and Affect: Mood normal.        Behavior: Behavior normal.        Thought Content: Thought content normal.        Judgment: Judgment normal.        Lab Review:     Component Value Date/Time   NA 134 (L) 12/23/2019 1115   K 3.6 12/23/2019 1115   CL 98 12/23/2019 1115   CO2 30 12/23/2019 1115   GLUCOSE 139 (H) 12/23/2019 1115   BUN 7 (L) 12/23/2019 1115   CREATININE 0.95 12/23/2019 1115   CALCIUM 9.3 12/23/2019 1115   PROT 7.2 12/23/2019 1115   ALBUMIN 3.5 12/23/2019 1115   AST 13 (L) 12/23/2019 1115   ALT 16 12/23/2019 1115   ALKPHOS 111 12/23/2019 1115   BILITOT 0.3 12/23/2019 1115   GFRNONAA >60 12/23/2019 1115   GFRAA >60 10/21/2019 1415       Component Value Date/Time   WBC 6.8 12/23/2019 1115   WBC 8.6 09/12/2019 1103   RBC 3.98 (L) 12/23/2019 1115   HGB 11.1 (L) 12/23/2019 1115   HCT 34.7 (L) 12/23/2019 1115   PLT 96 (L) 12/23/2019 1115   MCV 87.2 12/23/2019 1115   MCH 27.9 12/23/2019 1115   MCHC 32.0 12/23/2019 1115   RDW 19.9 (H) 12/23/2019 1115   LYMPHSABS 2.1 12/23/2019 1115   MONOABS 0.7 12/23/2019 1115   EOSABS 0.0 12/23/2019 1115  BASOSABS 0.1 12/23/2019 1115   -------------------------------  Imaging from last 24 hours (if applicable):  Radiology interpretation: No results found.

## 2019-12-30 ENCOUNTER — Inpatient Hospital Stay: Payer: Medicare Other | Admitting: Internal Medicine

## 2019-12-30 ENCOUNTER — Inpatient Hospital Stay: Payer: Medicare Other

## 2020-01-01 ENCOUNTER — Encounter (HOSPITAL_COMMUNITY): Payer: Self-pay

## 2020-01-01 ENCOUNTER — Ambulatory Visit (HOSPITAL_COMMUNITY)
Admission: RE | Admit: 2020-01-01 | Discharge: 2020-01-01 | Disposition: A | Discharge status: Home / Self Care | Payer: Medicare Other | Source: Ambulatory Visit | Attending: Physician Assistant | Admitting: Physician Assistant

## 2020-01-01 ENCOUNTER — Other Ambulatory Visit: Payer: Self-pay

## 2020-01-01 DIAGNOSIS — C3490 Malignant neoplasm of unspecified part of unspecified bronchus or lung: Secondary | ICD-10-CM

## 2020-01-01 MED ORDER — SODIUM CHLORIDE (PF) 0.9 % IJ SOLN
INTRAMUSCULAR | Status: AC
  Filled 2020-01-01: qty 50

## 2020-01-01 MED ORDER — IOHEXOL 300 MG/ML  SOLN
75.0000 mL | Freq: Once | INTRAMUSCULAR | Status: AC | PRN
  Administered 2020-01-01: 75 mL via INTRAVENOUS

## 2020-01-07 ENCOUNTER — Other Ambulatory Visit: Payer: Self-pay

## 2020-01-07 ENCOUNTER — Inpatient Hospital Stay: Payer: Medicare Other

## 2020-01-07 ENCOUNTER — Encounter: Payer: Self-pay | Admitting: Internal Medicine

## 2020-01-07 ENCOUNTER — Inpatient Hospital Stay (HOSPITAL_BASED_OUTPATIENT_CLINIC_OR_DEPARTMENT_OTHER): Payer: Medicare Other | Admitting: Internal Medicine

## 2020-01-07 VITALS — BP 143/76 | HR 71 | Temp 98.2°F | Resp 18 | Ht 72.0 in | Wt 202.7 lb

## 2020-01-07 DIAGNOSIS — Z5111 Encounter for antineoplastic chemotherapy: Secondary | ICD-10-CM

## 2020-01-07 DIAGNOSIS — C3491 Malignant neoplasm of unspecified part of right bronchus or lung: Secondary | ICD-10-CM | POA: Diagnosis not present

## 2020-01-07 DIAGNOSIS — C3490 Malignant neoplasm of unspecified part of unspecified bronchus or lung: Secondary | ICD-10-CM | POA: Diagnosis not present

## 2020-01-07 DIAGNOSIS — I10 Essential (primary) hypertension: Secondary | ICD-10-CM

## 2020-01-07 LAB — CBC WITH DIFFERENTIAL (CANCER CENTER ONLY)
Abs Immature Granulocytes: 0.02 10*3/uL (ref 0.00–0.07)
Basophils Absolute: 0.1 10*3/uL (ref 0.0–0.1)
Basophils Relative: 1 %
Eosinophils Absolute: 0.1 10*3/uL (ref 0.0–0.5)
Eosinophils Relative: 1 %
HCT: 38.1 % — ABNORMAL LOW (ref 39.0–52.0)
Hemoglobin: 12.2 g/dL — ABNORMAL LOW (ref 13.0–17.0)
Immature Granulocytes: 0 %
Lymphocytes Relative: 20 %
Lymphs Abs: 1.4 10*3/uL (ref 0.7–4.0)
MCH: 28.2 pg (ref 26.0–34.0)
MCHC: 32 g/dL (ref 30.0–36.0)
MCV: 88 fL (ref 80.0–100.0)
Monocytes Absolute: 1.1 10*3/uL — ABNORMAL HIGH (ref 0.1–1.0)
Monocytes Relative: 16 %
Neutro Abs: 4.3 10*3/uL (ref 1.7–7.7)
Neutrophils Relative %: 62 %
Platelet Count: 267 10*3/uL (ref 150–400)
RBC: 4.33 MIL/uL (ref 4.22–5.81)
RDW: 18.9 % — ABNORMAL HIGH (ref 11.5–15.5)
WBC Count: 6.9 10*3/uL (ref 4.0–10.5)
nRBC: 0 % (ref 0.0–0.2)

## 2020-01-07 LAB — CMP (CANCER CENTER ONLY)
ALT: 16 U/L (ref 0–44)
AST: 15 U/L (ref 15–41)
Albumin: 3.5 g/dL (ref 3.5–5.0)
Alkaline Phosphatase: 86 U/L (ref 38–126)
Anion gap: 9 (ref 5–15)
BUN: 8 mg/dL (ref 8–23)
CO2: 29 mmol/L (ref 22–32)
Calcium: 9.2 mg/dL (ref 8.9–10.3)
Chloride: 100 mmol/L (ref 98–111)
Creatinine: 0.97 mg/dL (ref 0.61–1.24)
GFR, Estimated: >60 mL/min (ref >60)
Glucose, Bld: 151 mg/dL — ABNORMAL HIGH (ref 70–99)
Potassium: 3.7 mmol/L (ref 3.5–5.1)
Sodium: 138 mmol/L (ref 135–145)
Total Bilirubin: 0.3 mg/dL (ref 0.3–1.2)
Total Protein: 7.4 g/dL (ref 6.5–8.1)

## 2020-01-07 NOTE — Progress Notes (Signed)
cbc

## 2020-01-07 NOTE — Progress Notes (Signed)
[place] [phone]   [phone]  OFFICE PROGRESS NOTE  [name], MD [address] [place] [address]  DIAGNOSIS:  limited stage (T3, N2, M0) small cell lung cancer presented with pulmonary nodules and pleural-based nodules in the right lung in addition to mediastinal lymphadenopathy diagnosed in August 2021.  The patient was previously treated without biopsy to a pleural-based nodule in the right lower lobe with SBRT in March 2021 but had evidence for disease recurrence and progression.  PRIOR THERAPY:  1) SBRT to right lower lobe pulmonary nodule in March 2021 under the care of Dr. Sondra Come. 2) Systemic chemotherapy with carboplatin for AUC of 5 on day 1 and etoposide 100 mg/M2 on days 1, 2 and 3 with Neulasta support every 3 weeks.  First cycle 10/07/2019.  Status post 4 cycles.  CURRENT THERAPY: Observation.  INTERVAL HISTORY: Jonathon [name] 69 y.o. male returns to the clinic today for follow-up visit accompanied by his wife.  The patient is feeling fine today with no concerning complaints except for shortness of breath with exertion especially when he has a bowel movement.  The patient denied having any current chest pain, cough or hemoptysis.  He denied having any fever or chills.  He has no nausea, vomiting, diarrhea or constipation.  He denied having any headache or visual changes.  He tolerated the previous course of chemotherapy fairly well.  He had repeat CT scan of the chest performed recently and is here for evaluation and discussion of his scan results.  MEDICAL HISTORY: Past Medical History:  Diagnosis Date  . CHF (congestive heart failure) ([place])   . Chronic cough   . COPD, severe ([place])    PT DENIES REGULAR DAILY INHALER ANY PRN  . Coronary artery disease   . CVA (cerebral vascular accident) ([place]) 02/22/2011   LEFT BRAIN STEM PONTINE HEMORRHAGE--  RIGHT SIDED WEAKNESS  . Diabetes mellitus without  complication ([place])    type 2  . Dyspnea   . GERD (gastroesophageal reflux disease)   . Harsh voice quality    PT STATES NORMAL FOR HIM  . High cholesterol   . History of CHF (congestive heart failure) MONITORED BY DR BOUSKA   DIASTOLIC  . Hydrocele, left   . Hypertension   . NSTEMI (non-ST elevated myocardial infarction) ([place]) 12/2015  . Pre-operative clearance    GIVEN BY DR Coletta Memos  . Short of breath on exertion   . Smoker   . Weakness of right side of body    S/P CVA   FEB 2013    ALLERGIES:  is allergic to atorvastatin.  MEDICATIONS:  Current Outpatient Medications  Medication Sig Dispense Refill  . albuterol (PROVENTIL) (2.5 MG/3ML) 0.083% nebulizer solution Take 2.5 mg by nebulization every 4 (four) hours as needed for wheezing or shortness of breath.    . Albuterol Sulfate (PROAIR RESPICLICK) 379 (90 BASE) MCG/ACT AEPB Inhale 2 puffs into the lungs 4 (four) times daily as needed. 1 each 5  . amLODipine (NORVASC) 10 MG tablet Take 10 mg by mouth every morning.     Marland Kitchen atorvastatin (LIPITOR) 20 MG tablet Take 20 mg by mouth daily.    . bisoprolol (ZEBETA) 5 MG tablet Take 1 tablet (5 mg total) by mouth daily. 30 tablet 0  . budesonide-formoterol (SYMBICORT) 160-4.5 MCG/ACT inhaler Inhale 2 puffs into the lungs 2 (two) times daily.    . cephALEXin (KEFLEX) 500 MG capsule Take  1 capsule (500 mg total) by mouth 4 (four) times daily. 20 capsule 0  . cloNIDine (CATAPRES) 0.3 MG tablet Take 0.3 mg by mouth 2 (two) times daily.    . Cyanocobalamin (VITAMIN B 12 PO) Take 1 tablet by mouth every morning.     . cyclobenzaprine (FLEXERIL) 5 MG tablet Take 5 mg by mouth at bedtime.     . dabigatran (PRADAXA) 150 MG CAPS capsule Take 150 mg by mouth 2 (two) times daily.    . DULoxetine (CYMBALTA) 30 MG capsule Take 30 mg by mouth every morning.     . gabapentin (NEURONTIN) 400 MG capsule Take 400 mg by mouth 3 (three) times daily.     Marland Kitchen losartan (COZAAR) 100 MG tablet Take 1 tablet (100 mg  total) by mouth daily. 90 tablet 3  . metFORMIN (GLUCOPHAGE) 500 MG tablet Take 500 mg by mouth daily with breakfast.    . mupirocin cream (BACTROBAN) 2 % Apply 1 application topically 2 (two) times daily. 15 g 0  . nitroGLYCERIN (NITROSTAT) 0.4 MG SL tablet Place 0.4 mg under the tongue every 5 (five) minutes as needed for chest pain.    . nortriptyline (PAMELOR) 25 MG capsule Take 25 mg by mouth at bedtime.    . pantoprazole (PROTONIX) 20 MG tablet TAKE 1 TABLET BY MOUTH DAILY 90 tablet 2  . prochlorperazine (COMPAZINE) 10 MG tablet Take 1 tablet (10 mg total) by mouth every 6 (six) hours as needed for nausea or vomiting. 30 tablet 0  . sulfamethoxazole-trimethoprim (BACTRIM DS) 800-160 MG tablet Take 1 tablet by mouth 2 (two) times daily. 14 tablet 0  . tamsulosin (FLOMAX) 0.4 MG CAPS capsule Take 0.4 mg by mouth daily.    . Tiotropium Bromide Monohydrate (SPIRIVA RESPIMAT) 2.5 MCG/ACT AERS Inhale 2 puffs into the lungs daily. 4 g 5   No current facility-administered medications for this visit.    SURGICAL HISTORY:  Past Surgical History:  Procedure Laterality Date  . ABDOMINAL ANGIOGRAM  09/04/2012   Procedure: ABDOMINAL ANGIOGRAM;  Surgeon: Laverda Page, MD;  Location: [facility] CATH LAB;  Service: Cardiovascular;;  . CARDIAC CATHETERIZATION  05-02-2011  DR Johnsie Cancel   MILD NONOBSTRUCTIVE CAD/ LAD, OM , AND D1/ EF 60^  . CARDIAC CATHETERIZATION  AUG 2000   NON-OBSTRUTIVE CAD/ EF 68%/ 25% PROXIMAL LAD/ 20% MID CIRCUMFLEX  . CARDIAC CATHETERIZATION [name] 12/23/2015   Procedure: Left Heart Cath and Coronary Angiography;  Surgeon: Adrian Prows, MD;  Location: [place] CV LAB;  Service: Cardiovascular;  Laterality: [name];  . CARDIAC CATHETERIZATION [name] 12/24/2015   Procedure: Coronary Stent Intervention;  Surgeon: Adrian Prows, MD;  Location: [place] CV LAB;  Service: Cardiovascular;  Laterality: [name];  . CORONARY ANGIOPLASTY    . CORONARY STENT PLACEMENT  12/24/2015    PTCA and stenting of the  distal RCA   . HYDROCELE EXCISION Left 04/24/2012   Procedure: HYDROCELECTOMY ADULT;  Surgeon: Fredricka Bonine, MD;  Location: [facility];  Service: Urology;  Laterality: Left;  90 mins req for this case     . [place]  . LEFT AND RIGHT HEART CATHETERIZATION WITH CORONARY ANGIOGRAM [name] 09/04/2012   Procedure: LEFT AND RIGHT HEART CATHETERIZATION WITH CORONARY ANGIOGRAM;  Surgeon: Laverda Page, MD;  Location: [facility] CATH LAB;  Service: Cardiovascular;  Laterality: [name];  . LEFT HEART CATHETERIZATION WITH CORONARY ANGIOGRAM [name] 05/02/2011   Procedure: LEFT HEART CATHETERIZATION WITH CORONARY ANGIOGRAM;  Surgeon: Josue Hector,  MD;  Location: [place] CATH LAB;  Service: Cardiovascular;  Laterality: [name];  . THORACOTOMY/LOBECTOMY Right 03-06-2008   AND STAPLING OF BLEBS FOR SPONTANOUS PNEUMOTHORAX  . TRANSTHORACIC ECHOCARDIOGRAM  02-17-2011   MODERATE LVH/ LVSF NORMAL/ EF 30-86%/ GRADE I DIASTOLIC DYSFUNCTION    REVIEW OF SYSTEMS:  Constitutional: positive for fatigue Eyes: negative Ears, nose, mouth, throat, and face: negative Respiratory: positive for dyspnea on exertion Cardiovascular: negative Gastrointestinal: negative Genitourinary:negative Integument/breast: negative Hematologic/lymphatic: negative Musculoskeletal:negative Neurological: negative Behavioral/Psych: negative Endocrine: negative Allergic/Immunologic: negative   PHYSICAL EXAMINATION: General appearance: alert, cooperative, fatigued and no distress Head: Normocephalic, without obvious abnormality, atraumatic Neck: no adenopathy, no JVD, supple, symmetrical, trachea midline and thyroid not enlarged, symmetric, no tenderness/mass/nodules Lymph nodes: Cervical, supraclavicular, and axillary nodes normal. Resp: clear to auscultation bilaterally Back: symmetric, no curvature. ROM normal. No CVA tenderness. Cardio: regular rate and rhythm, S1, S2 normal, no murmur, click, rub or  gallop GI: soft, non-tender; bowel sounds normal; no masses,  no organomegaly Extremities: extremities normal, atraumatic, no cyanosis or edema Neurologic: Alert and oriented X 3, normal strength and tone. Normal symmetric reflexes. Normal coordination and gait  ECOG PERFORMANCE STATUS: 1 - Symptomatic but completely ambulatory  Blood pressure (!) 143/76, pulse 71, temperature 98.2 F (36.8 C), temperature source Tympanic, resp. rate 18, height 6' (1.829 m), weight 202 lb 11.2 oz (91.9 kg), SpO2 96 %.  LABORATORY DATA: Lab Results  Component Value Date   WBC 6.8 12/23/2019   HGB 11.1 (L) 12/23/2019   HCT 34.7 (L) 12/23/2019   MCV 87.2 12/23/2019   PLT 96 (L) 12/23/2019      Chemistry      Component Value Date/Time   NA 134 (L) 12/23/2019 1115   K 3.6 12/23/2019 1115   CL 98 12/23/2019 1115   CO2 30 12/23/2019 1115   BUN 7 (L) 12/23/2019 1115   CREATININE 0.95 12/23/2019 1115      Component Value Date/Time   CALCIUM 9.3 12/23/2019 1115   ALKPHOS 111 12/23/2019 1115   AST 13 (L) 12/23/2019 1115   ALT 16 12/23/2019 1115   BILITOT 0.3 12/23/2019 1115       RADIOGRAPHIC STUDIES: CT Chest W Contrast  Result Date: 01/02/2020 CLINICAL DATA:  Primary Cancer Type: Lung Imaging Indication: Assess response to therapy Interval therapy since last imaging? Yes Initial Cancer Diagnosis Date: 01/24/2019; established by: Presumed Detailed Pathology: Limited stage small cell lung cancer. Primary Tumor location: Right lower lobe. Recurrence? Yes; Date(s) of recurrence: 09/12/2019; Established by: Biopsy-proven Surgeries: Coronary stent. Right upper lobectomy and stapling of blebs 03/06/2008. Chemotherapy: Yes; Ongoing? No; Most recent administration: 12/07/2019 Immunotherapy? No Radiation therapy? Yes; Date Range: 03/13/2019 - 03/20/2019; Target: Right lung EXAM: CT CHEST WITH CONTRAST TECHNIQUE: Multidetector CT imaging of the chest was performed during intravenous contrast administration.  CONTRAST:  26mL OMNIPAQUE IOHEXOL 300 MG/ML  SOLN COMPARISON:  Most recent CT chest 11/14/2019.  08/19/2019 PET-CT. FINDINGS: Cardiovascular: Normal heart size. No significant pericardial effusion/thickening. Three-vessel coronary atherosclerosis. Atherosclerotic nonaneurysmal thoracic aorta. Normal caliber pulmonary arteries. No central pulmonary emboli. Mediastinum/Nodes: No discrete thyroid nodules. Unremarkable esophagus. No axillary adenopathy. Mildly enlarged 1.0 cm right paratracheal node (series 2/image 40), previously 1.0 cm using similar measurement technique on 11/14/2019 chest CT, stable. No new pathologically enlarged mediastinal nodes. Mildly enlarged 1.2 cm right infrahilar node (series 2/image 92), previously 1.6 cm, mildly decreased. No new pathologically enlarged hilar nodes. Lungs/Pleura: No pneumothorax. No pleural effusion. Status post right upper lobectomy. Severe centrilobular and paraseptal emphysema  with bullous emphysema at the lung apices. Saber sheath trachea and diffuse bronchial wall thickening. No acute consolidative airspace disease. Medial left upper lobe pleural-based 2.3 cm nodular opacity (series 5/image 104), stable and previously non hypermetabolic, compatible with benign nodular scarring. Peripheral subpleural right lower lobe 0.7 x 0.2 cm pulmonary nodule (series 5/image 112), previously 1.3 x 0.6 cm, decreased. Superior segment right lower lobe 0.5 cm solid pulmonary nodule (series 5/image 48), previously 0.6 cm, minimally decreased. No new significant pulmonary nodules. Upper abdomen: Small hiatal hernia. Simple 2.6 cm posterior upper left renal cyst. Musculoskeletal: No aggressive appearing focal osseous lesions. Two similar clustered low-attenuation superficial subcutaneous lesions in the upper right back, largest 2.2 cm (series 2/image 42), stable, presumably benign lesions such as subcutaneous cysts. Mild thoracic spondylosis. IMPRESSION: 1. Continued interval positive  response to therapy. Right lower lobe pulmonary nodules are mildly decreased. Mild right infrahilar adenopathy is mildly decreased. Mild right paratracheal adenopathy is stable. 2. No new or progressive metastatic disease in the chest. 3. Aortic Atherosclerosis (ICD10-I70.0) and Emphysema (ICD10-J43.9). Electronically Signed   By: Ilona Sorrel [name]   On: 01/02/2020 10:24    ASSESSMENT AND PLAN: This is a very pleasant 69 years old white male recently diagnosed with limited stage small cell lung cancer (T3, N2, M0) presented with pleural-based pulmonary nodules in the right lung in addition to mediastinal lymphadenopathy in August 2021.  The patient was initially diagnosed with a pleural-based nodule status post SBRT in March 2021. He completed systemic chemotherapy with carboplatin for AUC of 5 from day 1 and to etoposide 100 mg/M2 on days 1, 2 and 3 with Neulasta support every 3 weeks.  Status post 4 cycles. The patient tolerated his treatment well except for fatigue. He had repeat CT scan of the chest performed recently.  I personally and independently reviewed the scans and discussed the results with the patient and his wife today. His scan showed further improvement of his disease. I recommended for the patient to continue on observation from now on with close monitoring. I will see him back for follow-up visit in 3 months with repeat CT scan of the chest for restaging of his disease. I will refer the patient back to Dr. Sondra Come for consideration of prophylactic cranial irradiation. For the shortness of breath with exertion, I advised the patient to see his cardiologist for evaluation in the absence of any clear findings on the CT scan of the chest except for his chronic COPD. The patient was advised to call immediately if he has any other concerning symptoms in the interval.  The patient voices understanding of current disease status and treatment options and is in agreement with the current care  plan.  All questions were answered. The patient knows to call the clinic with any problems, questions or concerns. We can certainly see the patient much sooner if necessary.  Disclaimer: This note was dictated with voice recognition software. Similar sounding words can inadvertently be transcribed and may not be corrected upon review.

## 2020-01-07 NOTE — Addendum Note (Signed)
Addended by: Ardeen Garland on: 01/07/2020 09:04 AM   Modules accepted: Orders

## 2020-01-08 ENCOUNTER — Telehealth: Payer: Self-pay | Admitting: Radiation Oncology

## 2020-01-08 NOTE — Telephone Encounter (Signed)
We received a referral from Dr. Julien Nordmann back to Dr. Sondra Come for radiation treatment options. I left a voicemail for the patient yesterday, 12/21 and today 12/22 for him to please call us back to schedule his appointment.

## 2020-01-22 ENCOUNTER — Ambulatory Visit
Admission: RE | Admit: 2020-01-22 | Discharge: 2020-01-22 | Disposition: A | Discharge status: Home / Self Care | Payer: Medicare Other | Source: Ambulatory Visit | Attending: Internal Medicine | Admitting: Internal Medicine

## 2020-01-22 ENCOUNTER — Other Ambulatory Visit: Payer: Self-pay

## 2020-01-22 ENCOUNTER — Encounter: Payer: Self-pay | Admitting: Radiation Oncology

## 2020-01-22 ENCOUNTER — Ambulatory Visit
Admission: RE | Admit: 2020-01-22 | Discharge: 2020-01-22 | Disposition: A | Discharge status: Home / Self Care | Payer: Medicare Other | Source: Ambulatory Visit | Attending: Radiation Oncology | Admitting: Radiation Oncology

## 2020-01-22 VITALS — BP 126/84 | HR 65 | Temp 98.7°F | Resp 20 | Ht 72.0 in | Wt 204.4 lb

## 2020-01-22 DIAGNOSIS — C3491 Malignant neoplasm of unspecified part of right bronchus or lung: Secondary | ICD-10-CM

## 2020-01-22 DIAGNOSIS — N281 Cyst of kidney, acquired: Secondary | ICD-10-CM | POA: Diagnosis not present

## 2020-01-22 DIAGNOSIS — Z79899 Other long term (current) drug therapy: Secondary | ICD-10-CM | POA: Insufficient documentation

## 2020-01-22 DIAGNOSIS — C3431 Malignant neoplasm of lower lobe, right bronchus or lung: Secondary | ICD-10-CM | POA: Diagnosis not present

## 2020-01-22 DIAGNOSIS — M47814 Spondylosis without myelopathy or radiculopathy, thoracic region: Secondary | ICD-10-CM | POA: Insufficient documentation

## 2020-01-22 DIAGNOSIS — J439 Emphysema, unspecified: Secondary | ICD-10-CM | POA: Insufficient documentation

## 2020-01-22 DIAGNOSIS — Z923 Personal history of irradiation: Secondary | ICD-10-CM | POA: Insufficient documentation

## 2020-01-22 DIAGNOSIS — K449 Diaphragmatic hernia without obstruction or gangrene: Secondary | ICD-10-CM | POA: Diagnosis not present

## 2020-01-22 DIAGNOSIS — Z7984 Long term (current) use of oral hypoglycemic drugs: Secondary | ICD-10-CM | POA: Insufficient documentation

## 2020-01-22 DIAGNOSIS — I6782 Cerebral ischemia: Secondary | ICD-10-CM | POA: Diagnosis not present

## 2020-01-22 DIAGNOSIS — R599 Enlarged lymph nodes, unspecified: Secondary | ICD-10-CM | POA: Diagnosis not present

## 2020-01-22 DIAGNOSIS — Z9221 Personal history of antineoplastic chemotherapy: Secondary | ICD-10-CM | POA: Diagnosis not present

## 2020-01-22 NOTE — Progress Notes (Signed)
Patient here for a reconsult with Dr. Sondra Come to discuss consideration of prophylactic cranial radiation.   Hx oflimited stage (T3, N2, M0) small cell lung cancer presented with pulmonary nodules and pleural-based nodules in the right lung in addition to mediastinal lymphadenopathy diagnosed in August 2021.   Past/Anticipated interventions by medical oncology, if any: Systemic chemotherapy with carboplatin for AUC of 5 on day 1 and etoposide 100 mg/M2 on days 1, 2 and 3 with Neulasta support every 3 weeks.  First cycle 10/07/2019.  Status post     tWeight changes, if any: has gained a few pounds  Respiratory complaints, if any: Pt reports occasional cough with white phlegm  Hemoptysis, if any: Pt denies hemoptysis  Pain issues, if any:  Pt denies c/o pain  Pt reports SOB with exertion.  SAFETY ISSUES:  Prior radiation? Yes 03/2019  Pacemaker/ICD? no   Possible current pregnancy?[name]  Is the patient on methotrexate? no  BP 126/84 (BP Location: Left Arm, Patient Position: Sitting, Cuff Size: Large)   Pulse 65   Temp 98.7 F (37.1 C)   Resp 20   Ht 6' (1.829 m)   Wt 204 lb 6.4 oz (92.7 kg)   SpO2 97%   BMI 27.72 kg/m   Wt Readings from Last 3 Encounters:  01/22/20 204 lb 6.4 oz (92.7 kg)  01/07/20 202 lb 11.2 oz (91.9 kg)  12/24/19 201 lb 11.2 oz (91.5 kg)

## 2020-01-22 NOTE — Progress Notes (Signed)
Radiation Oncology         [phone] ________________________________  Name: Jonathon [name] MRN: [id number]  Date: 01/22/2020  DOB: 1950/08/02  Re-Evaluation Note  CC: [name], MD  [name], MD    ICD-10-CM   1. Primary lung small cell carcinoma, right (HCC)  C34.91 MR Brain W Wo Contrast    Diagnosis: Limited stage (T3, N2, M0) small cell carcinoma of the right lower lobe  Narrative:  The patient returns today to discuss radiation treatment options. He was initially seen in consultation on 02/27/2019, after which time he was treated with SBRT (54 Gy of 18 Gy in 3 fractions) directed at the right lung from 03/13/2019 - 03/20/2019. He was last seen in follow-up on 09/02/2019, during which time PET scan was compatible with disease recurrence.  The patient's case was presented at the multidisciplinary thoracic oncology conference and given that one site of recurrence along the chest wall area it was felt reasonable to biopsy this area with low risk for pneumothorax.  He agreed to proceed with CT-guided biopsy and then follow-up with medical oncology.   CT-guided biopsy of the right chest wall FDG avid mass was performed on 09/12/2019 by Dr. Earleen Newport. Pathology from the procedure revealed small cell carcinoma.  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 However, there was no evidence of metastatic disease.  The patient began systemic chemotherapy with Carboplatin and Etoposide with Neulasta support on 10/07/2019 under the care of Dr. Julien Nordmann. He is currently status post four cycles and is now under observation with close monitoring.  Chest CT scan on 11/14/2019 showed an interval response to therapy. The right juxta hilar lung nodule had decreased in size in the  interval. The subpleural nodule within the periphery of the right lower lobe lung had also decreased in size in the interval. The tiny nodule within the right upper lung was stable to mildly decreased in the interval. Right hilar and right paratracheal lymph nodes were also stable to mildly decreased in size.   Chest CT scan on 01/01/2020 showed continued interval positive response to therapy. The right lower lobe pulmonary nodules had mildly decreases, as well as the mild right infrahilar adenopathy. Mild right paratracheal adenopathy was stable. There was no new or metastatic disease in the chest.  Given the patient's pulmonary status and locations of his recurrence he was not felt to be a candidate for consolidation radiation therapy to the chest.  The patient was last seen by Dr. Julien Nordmann on 01/07/2020, who recommended consideration for prophylactic cranial irradiation.  On review of systems, the patient reports gaining a few pounds, an occasional cough with white phlegm, and shortness of breath with exertion. He denies hemoptysis, chest pain, and any other symptoms.  He denies any headaches, visual problems or nausea.  He occasionally will have some mild expressive aphasia.     Allergies:  is allergic to atorvastatin.  Meds: Current Outpatient Medications  Medication Sig Dispense Refill  . albuterol (PROVENTIL) (2.5 MG/3ML) 0.083% nebulizer solution Take 2.5 mg by nebulization every 4 (four) hours as needed for wheezing or shortness of breath.    . Albuterol Sulfate (PROAIR RESPICLICK) 371 (90 BASE) MCG/ACT AEPB Inhale 2 puffs into the lungs 4 (four) times daily as needed. 1 each 5  . amLODipine (NORVASC) 10 MG tablet  Take 10 mg by mouth every morning.     Marland Kitchen atorvastatin (LIPITOR) 20 MG tablet Take 20 mg by mouth daily.    . bisoprolol (ZEBETA) 5 MG tablet Take 1 tablet (5 mg total) by mouth daily. 30 tablet 0  . budesonide-formoterol (SYMBICORT) 160-4.5 MCG/ACT inhaler Inhale 2 puffs into  the lungs 2 (two) times daily.    . cloNIDine (CATAPRES) 0.3 MG tablet Take 0.3 mg by mouth 2 (two) times daily.    . Cyanocobalamin (VITAMIN B 12 PO) Take 1 tablet by mouth every morning.     . cyclobenzaprine (FLEXERIL) 5 MG tablet Take 5 mg by mouth at bedtime.     . dabigatran (PRADAXA) 150 MG CAPS capsule Take 150 mg by mouth 2 (two) times daily.    . DULoxetine (CYMBALTA) 30 MG capsule Take 30 mg by mouth every morning.     . gabapentin (NEURONTIN) 400 MG capsule Take 400 mg by mouth 3 (three) times daily.     Marland Kitchen losartan (COZAAR) 100 MG tablet Take 1 tablet (100 mg total) by mouth daily. 90 tablet 3  . metFORMIN (GLUCOPHAGE) 500 MG tablet Take 500 mg by mouth daily with breakfast.    . mupirocin cream (BACTROBAN) 2 % Apply 1 application topically 2 (two) times daily. 15 g 0  . nitroGLYCERIN (NITROSTAT) 0.4 MG SL tablet Place 0.4 mg under the tongue every 5 (five) minutes as needed for chest pain.    . nortriptyline (PAMELOR) 25 MG capsule Take 25 mg by mouth at bedtime.    . pantoprazole (PROTONIX) 20 MG tablet TAKE 1 TABLET BY MOUTH DAILY 90 tablet 2  . prochlorperazine (COMPAZINE) 10 MG tablet Take 1 tablet (10 mg total) by mouth every 6 (six) hours as needed for nausea or vomiting. 30 tablet 0  . tamsulosin (FLOMAX) 0.4 MG CAPS capsule Take 0.4 mg by mouth daily.    . Tiotropium Bromide Monohydrate (SPIRIVA RESPIMAT) 2.5 MCG/ACT AERS Inhale 2 puffs into the lungs daily. 4 g 5   No current facility-administered medications for this encounter.    Physical Findings: The patient is in no acute distress. Patient is alert and oriented.  height is 6' (1.829 m) and weight is 204 lb 6.4 oz (92.7 kg). His temperature is 98.7 F (37.1 C). His blood pressure is 126/84 and his pulse is 65. His respiration is 20 and oxygen saturation is 97%.  No significant changes. Lungs are clear to auscultation bilaterally. Heart has regular rate and rhythm. No palpable cervical, supraclavicular, or axillary  adenopathy. Abdomen soft, non-tender, normal bowel sounds.  Neurological examination nonfocal  Lab Findings: Lab Results  Component Value Date   WBC 6.9 01/07/2020   HGB 12.2 (L) 01/07/2020   HCT 38.1 (L) 01/07/2020   MCV 88.0 01/07/2020   PLT 267 01/07/2020    Radiographic Findings: CT Chest W Contrast  Result Date: 01/02/2020 CLINICAL DATA:  Primary Cancer Type: Lung Imaging Indication: Assess response to therapy Interval therapy since last imaging? Yes Initial Cancer Diagnosis Date: 01/24/2019; established by: Presumed Detailed Pathology: Limited stage small cell lung cancer. Primary Tumor location: Right lower lobe. Recurrence? Yes; Date(s) of recurrence: 09/12/2019; Established by: Biopsy-proven Surgeries: Coronary stent. Right upper lobectomy and stapling of blebs 03/06/2008. Chemotherapy: Yes; Ongoing? No; Most recent administration: 12/07/2019 Immunotherapy? No Radiation therapy? Yes; Date Range: 03/13/2019 - 03/20/2019; Target: Right lung EXAM: CT CHEST WITH CONTRAST TECHNIQUE: Multidetector CT imaging of the chest was performed during intravenous contrast administration. CONTRAST:  37mL OMNIPAQUE  IOHEXOL 300 MG/ML  SOLN COMPARISON:  Most recent CT chest 11/14/2019.  08/19/2019 PET-CT. FINDINGS: Cardiovascular: Normal heart size. No significant pericardial effusion/thickening. Three-vessel coronary atherosclerosis. Atherosclerotic nonaneurysmal thoracic aorta. Normal caliber pulmonary arteries. No central pulmonary emboli. Mediastinum/Nodes: No discrete thyroid nodules. Unremarkable esophagus. No axillary adenopathy. Mildly enlarged 1.0 cm right paratracheal node (series 2/image 40), previously 1.0 cm using similar measurement technique on 11/14/2019 chest CT, stable. No new pathologically enlarged mediastinal nodes. Mildly enlarged 1.2 cm right infrahilar node (series 2/image 92), previously 1.6 cm, mildly decreased. No new pathologically enlarged hilar nodes. Lungs/Pleura: No pneumothorax.  No pleural effusion. Status post right upper lobectomy. Severe centrilobular and paraseptal emphysema with bullous emphysema at the lung apices. Saber sheath trachea and diffuse bronchial wall thickening. No acute consolidative airspace disease. Medial left upper lobe pleural-based 2.3 cm nodular opacity (series 5/image 104), stable and previously non hypermetabolic, compatible with benign nodular scarring. Peripheral subpleural right lower lobe 0.7 x 0.2 cm pulmonary nodule (series 5/image 112), previously 1.3 x 0.6 cm, decreased. Superior segment right lower lobe 0.5 cm solid pulmonary nodule (series 5/image 48), previously 0.6 cm, minimally decreased. No new significant pulmonary nodules. Upper abdomen: Small hiatal hernia. Simple 2.6 cm posterior upper left renal cyst. Musculoskeletal: No aggressive appearing focal osseous lesions. Two similar clustered low-attenuation superficial subcutaneous lesions in the upper right back, largest 2.2 cm (series 2/image 42), stable, presumably benign lesions such as subcutaneous cysts. Mild thoracic spondylosis. IMPRESSION: 1. Continued interval positive response to therapy. Right lower lobe pulmonary nodules are mildly decreased. Mild right infrahilar adenopathy is mildly decreased. Mild right paratracheal adenopathy is stable. 2. No new or progressive metastatic disease in the chest. 3. Aortic Atherosclerosis (ICD10-I70.0) and Emphysema (ICD10-J43.9). Electronically Signed   By: Ilona Sorrel [name]   On: 01/02/2020 10:24    Impression: Limited stage (T3, N2, M0) small cell carcinoma of the right lower lobe  The patient has had an excellent response to his chemotherapy.  2 recent chest CT scan shows stability.  Given these findings the patient will be a candidate for prophylactic cranial radiation.  I discussed the course of treatment side effects and potential long-term toxicities of whole brain radiation therapy with the patient and his wife.  We also discussed the  option of close follow-up with MRIs approximately every 3 months if he decides not to consider prophylactic cranial radiation.  After careful consideration patient wishes to proceed with prophylactic cranial radiation.  Prior to proceeding with radiation treatments I will order a repeat brain MRI since it has been almost 4 months since his last MRI.  Plan: Patient will be scheduled for his brain MRI and then simulation soon after.  Anticipate 10 treatments to the whole brain.  I  Total time spent in this encounter was 35 minutes which included reviewing the patient's most recent follow-ups with Dr. Julien Nordmann, chest CT scans, chemotherapy, biopsy, pathology report, physical examination, and documentation.  -----------------------------------  Blair Promise, PhD, MD  This document serves as a record of services personally performed by Gery Pray, MD. It was created on his behalf by Clerance Lav, a trained medical scribe. The creation of this record is based on the scribe's personal observations and the provider's statements to them. This document has been checked and approved by the attending provider.

## 2020-01-29 ENCOUNTER — Other Ambulatory Visit: Payer: Self-pay | Admitting: Cardiology

## 2020-01-29 DIAGNOSIS — I10 Essential (primary) hypertension: Secondary | ICD-10-CM

## 2020-01-30 ENCOUNTER — Ambulatory Visit (HOSPITAL_COMMUNITY)
Admission: RE | Admit: 2020-01-30 | Discharge: 2020-01-30 | Disposition: A | Discharge status: Home / Self Care | Payer: Medicare Other | Source: Ambulatory Visit | Attending: Radiation Oncology | Admitting: Radiation Oncology

## 2020-01-30 ENCOUNTER — Telehealth: Payer: Self-pay

## 2020-01-30 ENCOUNTER — Other Ambulatory Visit (HOSPITAL_COMMUNITY): Payer: Self-pay | Admitting: Family Medicine

## 2020-01-30 DIAGNOSIS — C3491 Malignant neoplasm of unspecified part of right bronchus or lung: Secondary | ICD-10-CM | POA: Diagnosis not present

## 2020-01-30 MED ORDER — GADOBUTROL 1 MMOL/ML IV SOLN
9.0000 mL | Freq: Once | INTRAVENOUS | Status: AC | PRN
  Administered 2020-01-30: 9 mL via INTRAVENOUS

## 2020-02-10 ENCOUNTER — Ambulatory Visit
Admission: RE | Admit: 2020-02-10 | Discharge: 2020-02-10 | Disposition: A | Discharge status: Home / Self Care | Payer: Medicare Other | Source: Ambulatory Visit | Attending: Radiation Oncology | Admitting: Radiation Oncology

## 2020-02-10 ENCOUNTER — Other Ambulatory Visit: Payer: Self-pay

## 2020-02-10 DIAGNOSIS — C3431 Malignant neoplasm of lower lobe, right bronchus or lung: Secondary | ICD-10-CM | POA: Insufficient documentation

## 2020-02-10 DIAGNOSIS — C3491 Malignant neoplasm of unspecified part of right bronchus or lung: Secondary | ICD-10-CM

## 2020-02-10 DIAGNOSIS — Z510 Encounter for antineoplastic radiation therapy: Secondary | ICD-10-CM | POA: Insufficient documentation

## 2020-02-11 DIAGNOSIS — Z510 Encounter for antineoplastic radiation therapy: Secondary | ICD-10-CM | POA: Diagnosis not present

## 2020-02-12 NOTE — Telephone Encounter (Signed)
Error

## 2020-02-17 ENCOUNTER — Ambulatory Visit
Admission: RE | Admit: 2020-02-17 | Discharge: 2020-02-17 | Disposition: A | Discharge status: Home / Self Care | Payer: Medicare Other | Source: Ambulatory Visit | Attending: Radiation Oncology | Admitting: Radiation Oncology

## 2020-02-17 ENCOUNTER — Other Ambulatory Visit: Payer: Self-pay

## 2020-02-17 DIAGNOSIS — Z510 Encounter for antineoplastic radiation therapy: Secondary | ICD-10-CM | POA: Diagnosis not present

## 2020-02-17 DIAGNOSIS — C3491 Malignant neoplasm of unspecified part of right bronchus or lung: Secondary | ICD-10-CM

## 2020-02-18 ENCOUNTER — Other Ambulatory Visit: Payer: Self-pay

## 2020-02-18 ENCOUNTER — Ambulatory Visit
Admission: RE | Admit: 2020-02-18 | Discharge: 2020-02-18 | Disposition: A | Discharge status: Home / Self Care | Payer: Medicare Other | Source: Ambulatory Visit | Attending: Radiation Oncology | Admitting: Radiation Oncology

## 2020-02-18 ENCOUNTER — Other Ambulatory Visit: Payer: Self-pay | Admitting: Radiation Oncology

## 2020-02-18 DIAGNOSIS — C3431 Malignant neoplasm of lower lobe, right bronchus or lung: Secondary | ICD-10-CM | POA: Diagnosis present

## 2020-02-18 DIAGNOSIS — Z510 Encounter for antineoplastic radiation therapy: Secondary | ICD-10-CM | POA: Diagnosis present

## 2020-02-18 MED ORDER — DEXAMETHASONE 4 MG PO TABS: 4.0000 mg | ORAL_TABLET | Freq: Every day | ORAL | 0 refills | Status: DC

## 2020-02-19 ENCOUNTER — Ambulatory Visit
Admission: RE | Admit: 2020-02-19 | Discharge: 2020-02-19 | Disposition: A | Discharge status: Home / Self Care | Payer: Medicare Other | Source: Ambulatory Visit | Attending: Radiation Oncology | Admitting: Radiation Oncology

## 2020-02-19 DIAGNOSIS — Z510 Encounter for antineoplastic radiation therapy: Secondary | ICD-10-CM | POA: Diagnosis not present

## 2020-02-20 ENCOUNTER — Ambulatory Visit
Admission: RE | Admit: 2020-02-20 | Discharge: 2020-02-20 | Disposition: A | Discharge status: Home / Self Care | Payer: Medicare Other | Source: Ambulatory Visit | Attending: Radiation Oncology | Admitting: Radiation Oncology

## 2020-02-20 ENCOUNTER — Other Ambulatory Visit: Payer: Self-pay

## 2020-02-20 DIAGNOSIS — Z510 Encounter for antineoplastic radiation therapy: Secondary | ICD-10-CM | POA: Diagnosis not present

## 2020-02-21 ENCOUNTER — Ambulatory Visit
Admission: RE | Admit: 2020-02-21 | Discharge: 2020-02-21 | Disposition: A | Discharge status: Home / Self Care | Payer: Medicare Other | Source: Ambulatory Visit | Attending: Radiation Oncology | Admitting: Radiation Oncology

## 2020-02-21 DIAGNOSIS — Z510 Encounter for antineoplastic radiation therapy: Secondary | ICD-10-CM | POA: Diagnosis not present

## 2020-02-24 ENCOUNTER — Ambulatory Visit
Admission: RE | Admit: 2020-02-24 | Discharge: 2020-02-24 | Disposition: A | Discharge status: Home / Self Care | Payer: Medicare Other | Source: Ambulatory Visit | Attending: Radiation Oncology | Admitting: Radiation Oncology

## 2020-02-24 ENCOUNTER — Other Ambulatory Visit: Payer: Self-pay

## 2020-02-24 DIAGNOSIS — Z510 Encounter for antineoplastic radiation therapy: Secondary | ICD-10-CM | POA: Diagnosis not present

## 2020-02-25 ENCOUNTER — Ambulatory Visit
Admission: RE | Admit: 2020-02-25 | Discharge: 2020-02-25 | Disposition: A | Discharge status: Home / Self Care | Payer: Medicare Other | Source: Ambulatory Visit | Attending: Radiation Oncology | Admitting: Radiation Oncology

## 2020-02-25 DIAGNOSIS — Z510 Encounter for antineoplastic radiation therapy: Secondary | ICD-10-CM | POA: Diagnosis not present

## 2020-02-26 ENCOUNTER — Ambulatory Visit
Admission: RE | Admit: 2020-02-26 | Discharge: 2020-02-26 | Disposition: A | Discharge status: Home / Self Care | Payer: Medicare Other | Source: Ambulatory Visit | Attending: Radiation Oncology | Admitting: Radiation Oncology

## 2020-02-26 DIAGNOSIS — Z510 Encounter for antineoplastic radiation therapy: Secondary | ICD-10-CM | POA: Diagnosis not present

## 2020-02-27 ENCOUNTER — Ambulatory Visit
Admission: RE | Admit: 2020-02-27 | Discharge: 2020-02-27 | Disposition: A | Discharge status: Home / Self Care | Payer: Medicare Other | Source: Ambulatory Visit | Attending: Radiation Oncology | Admitting: Radiation Oncology

## 2020-02-27 ENCOUNTER — Other Ambulatory Visit: Payer: Self-pay

## 2020-02-27 DIAGNOSIS — Z510 Encounter for antineoplastic radiation therapy: Secondary | ICD-10-CM | POA: Diagnosis not present

## 2020-02-28 ENCOUNTER — Other Ambulatory Visit: Payer: Self-pay

## 2020-02-28 ENCOUNTER — Encounter: Payer: Self-pay | Admitting: Radiation Oncology

## 2020-02-28 ENCOUNTER — Other Ambulatory Visit: Payer: Self-pay | Admitting: Pulmonary Disease

## 2020-02-28 ENCOUNTER — Other Ambulatory Visit (HOSPITAL_COMMUNITY): Payer: Self-pay | Admitting: Family Medicine

## 2020-02-28 ENCOUNTER — Ambulatory Visit
Admission: RE | Admit: 2020-02-28 | Discharge: 2020-02-28 | Disposition: A | Discharge status: Home / Self Care | Payer: Medicare Other | Source: Ambulatory Visit | Attending: Radiation Oncology | Admitting: Radiation Oncology

## 2020-02-28 DIAGNOSIS — Z510 Encounter for antineoplastic radiation therapy: Secondary | ICD-10-CM | POA: Diagnosis not present

## 2020-03-02 ENCOUNTER — Other Ambulatory Visit: Payer: Self-pay | Admitting: Radiation Oncology

## 2020-03-02 ENCOUNTER — Telehealth: Payer: Self-pay | Admitting: *Deleted

## 2020-03-02 DIAGNOSIS — C3491 Malignant neoplasm of unspecified part of right bronchus or lung: Secondary | ICD-10-CM

## 2020-03-02 DIAGNOSIS — G4709 Other insomnia: Secondary | ICD-10-CM

## 2020-03-02 MED ORDER — TEMAZEPAM 15 MG PO CAPS: 15.0000 mg | ORAL_CAPSULE | Freq: Every evening | ORAL | 0 refills | Status: DC | PRN

## 2020-03-02 NOTE — Telephone Encounter (Signed)
Patients wife called and left message regarding patient having worsening headaches.  Returned call and spoke with patient directly.  He states that the tried taking Decadron 2 mg am and 2 mg pm for the headaches that he has been experiencing since prophylactic radiation to the brain.  Patient states that the decadron has caused insomnia.  He hasn't been consistent with using it regularly because of the insomnia but does report he last took it Friday night.  He experienced worsening headache last night (Sunday) and tynelol was not effective is resolving it.    In lieu of Dr Sondra Come being off today I spoke with Dr Isidore Moos to seek guidance for patient to resolve headaches. Per Dr. Isidore Moos patient should take Decadron 4 mg daily and she will order a short dose of temazepam to help with sleep.  She also encouraged patient to give Dr. Sondra Come an update by no later than Wednesday if the Decadron dose was effective with the temazepam.  Potentially with moving up his 1 month follow up if headaches still don't improve.  Routed to Dr. Sondra Come.

## 2020-03-04 ENCOUNTER — Telehealth: Payer: Self-pay | Admitting: Emergency Medicine

## 2020-03-04 NOTE — Progress Notes (Signed)
Primary Physician/Referring:  [name], MD  Patient ID: Jonathon [name], male    DOB: [birth year], 70 y.o.   MRN: [id number]  Chief Complaint  Patient presents with   Coronary Artery Disease   Follow-up   HPI:    Jonathon [name]  is a 70 y.o.  [name] male patient with history of hypertension, hyperlipidemia, remote stroke without restrictive defect, tobacco cessation in September 2018, CAD with PCI in December 2017 to RCA. He also has history of emphysema, pulmonary nodules and right pneumothorax in 2010 due to emphysematous bleb while he was in [place] and has history of right upper lobe lobectomy for lung cancer.  He has had recurrence of cancer in the right lower lobe and recently finished radiation therapy.  Patient presents for 3 month follow up hypertension. At last visit increased losartan from 50 mg to 100 mg daily.  Patient's renal function electrolytes have remained stable.  He now reports home blood pressure readings averaging 125/82 mmHg.  He denies chest pain, palpitations, dizziness. He does continue to have chronic dyspnea on exertion and cough, which he reports to be stable.   Notably patient has remained stable from a cardiovascular standpoint was recommended follow-up in 6 months, his request he prefers to be seen every 3 months.  He is without specific complaints today.  Past Medical History:  Diagnosis Date   CHF (congestive heart failure) (HCC)    Chronic cough    COPD, severe (HCC)    PT DENIES REGULAR DAILY INHALER ANY PRN   Coronary artery disease    CVA (cerebral vascular accident) ([place]) 02/22/2011   LEFT BRAIN STEM PONTINE HEMORRHAGE--  RIGHT SIDED WEAKNESS   Diabetes mellitus without complication (HCC)    type 2   Dyspnea    GERD (gastroesophageal reflux disease)    Harsh voice quality    PT STATES NORMAL FOR HIM   High cholesterol    History of CHF (congestive heart failure) MONITORED BY DR Coletta Memos   DIASTOLIC    Hydrocele, left    Hypertension    NSTEMI (non-ST elevated myocardial infarction) ([place]) 12/2015   Pre-operative Jonathon    GIVEN BY DR Coletta Memos   Short of breath on exertion    Smoker    Weakness of right side of body    S/P CVA   FEB 2013   Past Surgical History:  Procedure Laterality Date   ABDOMINAL ANGIOGRAM  09/04/2012   Procedure: ABDOMINAL ANGIOGRAM;  Surgeon: Laverda Page, MD;  Location: [facility] CATH LAB;  Service: Cardiovascular;;   CARDIAC CATHETERIZATION  05-02-2011  DR Johnsie Cancel   MILD NONOBSTRUCTIVE CAD/ LAD, OM , AND D1/ EF 60^   CARDIAC CATHETERIZATION  AUG 2000   NON-OBSTRUTIVE CAD/ EF 68%/ 25% PROXIMAL LAD/ 20% MID CIRCUMFLEX   CARDIAC CATHETERIZATION [name] 12/23/2015   Procedure: Left Heart Cath and Coronary Angiography;  Surgeon: Adrian Prows, MD;  Location: [facility];  Service: Cardiovascular;  Laterality: [name];   CARDIAC CATHETERIZATION [name] 12/24/2015   Procedure: Coronary Stent Intervention;  Surgeon: Adrian Prows, MD;  Location: [facility] LAB;  Service: Cardiovascular;  Laterality: [name];   CORONARY ANGIOPLASTY     CORONARY STENT PLACEMENT  12/24/2015    PTCA and stenting of the distal RCA    HYDROCELE EXCISION Left 04/24/2012   Procedure: HYDROCELECTOMY ADULT;  Surgeon: Fredricka Bonine, MD;  Location: [facility];  Service: Urology;  Laterality: Left;  90 mins req for this case  INGUINAL HERNIA REPAIR  1954   LEFT AND RIGHT HEART CATHETERIZATION WITH CORONARY ANGIOGRAM [name] 09/04/2012   Procedure: LEFT AND RIGHT HEART CATHETERIZATION WITH CORONARY ANGIOGRAM;  Surgeon: Laverda Page, MD;  Location: [facility] CATH LAB;  Service: Cardiovascular;  Laterality: [name];   LEFT HEART CATHETERIZATION WITH CORONARY ANGIOGRAM [name] 05/02/2011   Procedure: LEFT HEART CATHETERIZATION WITH CORONARY ANGIOGRAM;  Surgeon: Josue Hector, MD;  Location: [facility] CATH LAB;  Service: Cardiovascular;  Laterality: [name];   THORACOTOMY/LOBECTOMY Right  03-06-2008   AND STAPLING OF BLEBS FOR SPONTANOUS PNEUMOTHORAX   TRANSTHORACIC ECHOCARDIOGRAM  02-17-2011   MODERATE LVH/ LVSF NORMAL/ EF 25-05%/ GRADE I DIASTOLIC DYSFUNCTION   Social History   Tobacco Use   Smoking status: Former Smoker    Packs/day: 0.00    Years: 53.00    Pack years: 0.00    Types: Cigarettes    Quit date: 2019    Years since quitting: 3.1   Smokeless tobacco: Never Used   Tobacco comment: 3-4 cigarettes daily- 08/30/17  Substance Use Topics   Alcohol use: Not Currently    Alcohol/week: 0.0 standard drinks    Comment: 02/10/2016 "nothing in the last 10 years"   Marital status: Married   ROS  Review of Systems  Constitutional: Negative for malaise/fatigue and weight gain.  Cardiovascular: Negative for chest pain, claudication, leg swelling, near-syncope, orthopnea, palpitations, paroxysmal nocturnal dyspnea and syncope.  Respiratory: Positive for cough, shortness of breath and wheezing.   Hematologic/Lymphatic: Does not bruise/bleed easily.  Gastrointestinal: Negative for melena.  Neurological: Negative for dizziness and weakness.   Objective   Vitals with BMI 03/05/2020 01/22/2020 01/07/2020  Height 6\' 0"  6\' 0"  6\' 0"   Weight 209 lbs 204 lbs 6 oz 202 lbs 11 oz  BMI 28.34 39.76 73.41  Systolic 937 902 409  Diastolic 74 84 76  Pulse 60 65 71    Blood pressure 138/74, pulse 60, temperature 99.4 F (37.4 C), height 6' (1.829 m), weight 209 lb (94.8 kg), SpO2 96 %. Body mass index is 28.35 kg/m.   Physical Exam Vitals reviewed.  Constitutional:      Appearance: He is obese.     Comments: Well-built, mildly obese in no acute distress.  Cardiovascular:     Rate and Rhythm: Normal rate and regular rhythm.     Pulses: Normal pulses and intact distal pulses.     Heart sounds: Normal heart sounds. No murmur heard. No gallop.      Comments: No JVD.  Pulmonary:     Effort: Pulmonary effort is normal.     Breath sounds: Wheezing (diffuse scattered  occasional) and rales (left base) present.  Abdominal:     General: Bowel sounds are normal.     Palpations: Abdomen is soft.  Musculoskeletal:        General: Normal range of motion.     Right lower leg: Edema (trace) present.     Left lower leg: Edema (trace) present.  Skin:    General: Skin is warm and dry.  Neurological:     Mental Status: He is alert.     Laboratory examination:   CMP Latest Ref Rng & Units 01/07/2020 12/23/2019 12/16/2019  Glucose 70 - 99 mg/dL 151(H) 139(H) 143(H)  BUN 8 - 23 mg/dL 8 7(L) 18  Creatinine 0.61 - 1.24 mg/dL 0.97 0.95 0.76  Sodium 135 - 145 mmol/L 138 134(L) 134(L)  Potassium 3.5 - 5.1 mmol/L 3.7 3.6 3.3(L)  Chloride 98 - 111 mmol/L 100 98  96(L)  CO2 22 - 32 mmol/L 29 30 28   Calcium 8.9 - 10.3 mg/dL 9.2 9.3 9.1  Total Protein 6.5 - 8.1 g/dL 7.4 7.2 6.5  Total Bilirubin 0.3 - 1.2 mg/dL 0.3 0.3 0.9  Alkaline Phos 38 - 126 U/L 86 111 110  AST 15 - 41 U/L 15 13(L) 9(L)  ALT 0 - 44 U/L 16 16 11    CBC Latest Ref Rng & Units 01/07/2020 12/23/2019 12/16/2019  WBC 4.0 - 10.5 K/uL 6.9 6.8 24.1(H)  Hemoglobin 13.0 - 17.0 g/dL 12.2(L) 11.1(L) 10.7(L)  Hematocrit 39.0 - 52.0 % 38.1(L) 34.7(L) 31.9(L)  Platelets 150 - 400 K/uL 267 96(L) 169   TSH No results for input(s): TSH in the last 8760 hours.   External Labs:  11/27/2019: A1c 7.0%. Total cholesterol 135, triglycerides 103, HDL 62, LDL 54.  Medications   Allergies  Allergen Reactions   Atorvastatin Other (See Comments)    Severe constipation      Current Outpatient Medications  Medication Instructions   albuterol (PROVENTIL) 2.5 mg, Nebulization, Every 4 hours PRN   Albuterol Sulfate (PROAIR RESPICLICK) 893 (90 BASE) MCG/ACT AEPB 2 puffs, Inhalation, 4 times daily PRN   amLODipine (NORVASC) 10 mg, Oral, BH-each morning   atorvastatin (LIPITOR) 20 mg, Oral, Daily   bisoprolol (ZEBETA) 5 mg, Oral, Daily   budesonide-formoterol (SYMBICORT) 160-4.5 MCG/ACT inhaler 2 puffs,  Inhalation, 2 times daily   cloNIDine (CATAPRES) 0.3 mg, 2 times daily   Cyanocobalamin (VITAMIN B 12 PO) 1 tablet, Oral, BH-each morning   cyclobenzaprine (FLEXERIL) 5 mg, Oral, Daily at bedtime   dabigatran (PRADAXA) 150 mg, Oral, 2 times daily   dexamethasone (DECADRON) 4 mg, Oral, Daily   DULoxetine (CYMBALTA) 30 mg, Oral, BH-each morning   gabapentin (NEURONTIN) 400 mg, Oral, 3 times daily   hydrochlorothiazide (MICROZIDE) 12.5 mg, Oral, Daily   losartan (COZAAR) 100 mg, Oral, Daily   metFORMIN (GLUCOPHAGE) 500 mg, Daily with breakfast   mupirocin cream (BACTROBAN) 2 % 1 application, Topical, 2 times daily   nitroGLYCERIN (NITROSTAT) 0.4 mg, Sublingual, Every 5 min PRN   nortriptyline (PAMELOR) 25 mg, Daily at bedtime   pantoprazole (PROTONIX) 20 MG tablet TAKE 1 TABLET BY MOUTH DAILY   prochlorperazine (COMPAZINE) 10 mg, Oral, Every 6 hours PRN   tamsulosin (FLOMAX) 0.4 mg, Oral, Daily   Tiotropium Bromide Monohydrate (SPIRIVA RESPIMAT) 2.5 MCG/ACT AERS 2 puffs, Inhalation, Daily    Radiology:  No results found.  CT scan of the chest with contrast 11/14/2019: 1. Interval response to therapy. The right juxta hilar lung nodule has decreased in size in the interval. The subpleural nodule within the periphery of the right lower lobe lung is also decreased in size in the interval. The tiny nodule within the right upper lung is stable to mildly decreased in the interval. 2. Stable to mild decrease in size of right hilar and right paratracheal lymph nodes. 3. Emphysema and aortic atherosclerosis. 4. Coronary artery calcifications.  Compared to 01/24/2019, there is no mention of descending thoracic aortic aneurysm measuring 3.3 x 3.3 cm.  Cardiac Studies:   Coronary angiogram 12/24/2015: PTCA and stenting of the distal RCA with 4.0 x 12 mm onyx DES. Normal LVEF. Mild disease in other vessels.  Lexiscan myoview stress test 06/19/2017:  1. Lexiscan stress test was  performed. Exercise capacity was not assessed. Stress symptoms included shortness of breath, lightheadedness, and chest pressure. Blood pressure was 126/78 mmHg. Stress EKG is non diagnostic for ischemia as it is  a pharmacologic stress. In addition, it showed normal sinus rhythm, normal stress conduction, no stress arrhythmias and normal stress repolarization.   2. The overall quality of the study is good. There is no evidence of abnormal lung activity. Stress and rest SPECT images demonstrate homogeneous tracer distribution throughout the myocardium. Gated SPECT imaging reveals normal myocardial thickening and wall motion. The left ventricular ejection fraction was calculated as 43%, however visually appears normal. 3. Low to intermediate risk study.  Echocardiogram 08/08/2017:  Left ventricle cavity is normal in size. Mild concentric hypertrophy of the left ventricle. Normal global wall motion. Normal diastolic filling pattern. Calculated EF 52%. Mild to moderate aortic regurgitation. Mild (Grade I) mitral regurgitation. Inadequate tricuspid regurgitation jet to estimate pulmonary artery pressure. Normal right atrial pressure. No significant change compared to previous study on 01/06/2016.   EKG:   EKG 12/02/2019: Normal sinus rhythm at rate of 66 bpm, leftward axis, incomplete right bundle branch block.  No significant change from 05/07/2019.   Assessment     ICD-10-CM   1. Essential hypertension, benign  I10   2. Coronary artery disease involving native coronary artery of native heart without angina pectoris  I25.10   3. Hypercholesteremia  E78.00   4. Descending thoracic aortic aneurysm (HCC) 3.3 cm 2021 CT  I71.2     No orders of the defined types were placed in this encounter.  Medications Discontinued During This Encounter  Medication Reason   temazepam (RESTORIL) 15 MG capsule Completed Course    Recommendations:   Jonathon [name]  is a 70 y.o. [name] male patient with  history of hypertension, hyperlipidemia, remote stroke without restrictive defect, tobacco cessation in September 2018, CAD with PCI in December 2017 to RCA. He also has history of emphysema, pulmonary nodules and right pneumothorax in 2010 due to emphysematous bleb while he was in [place] and has history of right upper lobe lobectomy for lung cancer.  He has had recurrence of cancer in the right lower lobe and recently finished radiation therapy.   Patient presents for 3 month follow up of hypertension, which is now well controlled.  We will continue amlodipine, bisprolol, hydrochlorothiazide, and losartan.  I personally reviewed external labs, lipids are well controlled.  Patient remains asymptomatic from a cardiovascular standpoint and there are no clinical signs of heart failure.  In regard to thoracic aortic aneurysm, it is very small and recommend continue with clinical follow-up labs at this time.   As patient continues to remain stable from a cardiovascular standpoint and hypertension is well controlled, recommend 36-month follow-up.  However patient insists on follow-up in 3 months.  Therefore we will schedule for follow-up in 3 months, sooner if needed for hypertension, hyperlipidemia, CAD.   Alethia Berthold, PA-C 03/05/2020, 4:18 PM Office: [phone]

## 2020-03-05 ENCOUNTER — Other Ambulatory Visit: Payer: Self-pay

## 2020-03-05 ENCOUNTER — Ambulatory Visit (INDEPENDENT_AMBULATORY_CARE_PROVIDER_SITE_OTHER): Payer: Medicare Other | Admitting: Student

## 2020-03-05 ENCOUNTER — Encounter: Payer: Self-pay | Admitting: Student

## 2020-03-05 VITALS — BP 138/74 | HR 60 | Temp 99.4°F | Ht 72.0 in | Wt 209.0 lb

## 2020-03-05 DIAGNOSIS — I2510 Atherosclerotic heart disease of native coronary artery without angina pectoris: Secondary | ICD-10-CM

## 2020-03-05 DIAGNOSIS — E7800 Pure hypercholesterolemia, unspecified: Secondary | ICD-10-CM

## 2020-03-05 DIAGNOSIS — I712 Thoracic aortic aneurysm, without rupture: Secondary | ICD-10-CM

## 2020-03-05 DIAGNOSIS — I7123 Aneurysm of the descending thoracic aorta, without rupture: Secondary | ICD-10-CM

## 2020-03-05 DIAGNOSIS — I10 Essential (primary) hypertension: Secondary | ICD-10-CM

## 2020-03-13 ENCOUNTER — Ambulatory Visit: Payer: Medicare Other | Admitting: Primary Care

## 2020-03-13 NOTE — Progress Notes (Deleted)
@Patient  ID: Jonathon [name], male    DOB: March 03, 1950, 70 y.o.   MRN: [id number]  No chief complaint on file.   Referring provider: [name], MD  HPI: 70 year old male, former smoker quit 2019 (53 pack year hx). PMH significant for COPD gold II, primary lung small cell carcinoma, diastolic heart failure, NSTEMI, HTN, CAD, type 2 diabetes, CVA. Patient of Dr. Vaughan Browner, last seen 02/15/19. Patient's case was disucssed at [facility] and they recommended radiation therapy without tissue biopsy. Referred to radiation oncology in February 2021. Recommended 3 month follow-up.   03/13/2020 Patient presents today for overdue follow-up.      Allergies  Allergen Reactions  . Atorvastatin Other (See Comments)    Severe constipation    Immunization History  Administered Date(s) Administered  . Influenza Split 10/24/2008, 02/03/2010, 11/03/2010, 10/19/2011, 10/22/2012  . Influenza, High Dose Seasonal PF 10/18/2018  . Influenza,inj,Quad PF,6+ Mos 10/13/2010, 09/29/2013, 10/27/2014, 11/25/2015, 09/25/2017  . Influenza-Unspecified 10/24/2008, 02/03/2010, 10/13/2010, 10/19/2011, 10/22/2012, 09/28/2013, 09/29/2013, 09/29/2014, 09/29/2014, 10/27/2014, 11/25/2015, 11/29/2016  . PFIZER(Purple Top)SARS-COV-2 Vaccination 01/21/2020, 03/04/2020  . Pneumococcal Conjugate-13 01/29/2015  . Pneumococcal Polysaccharide-23 10/24/2008, 02/22/2011, 04/27/2016  . Zoster 12/25/2013, 12/25/2013, 01/27/2014    Past Medical History:  Diagnosis Date  . CHF (congestive heart failure) ([place])   . Chronic cough   . COPD, severe ([place])    PT DENIES REGULAR DAILY INHALER ANY PRN  . Coronary artery disease   . CVA (cerebral vascular accident) ([place]) 02/22/2011   LEFT BRAIN STEM PONTINE HEMORRHAGE--  RIGHT SIDED WEAKNESS  . Diabetes mellitus without complication ([place])    type 2  . Dyspnea   . GERD (gastroesophageal reflux disease)   . Harsh voice quality    PT STATES NORMAL FOR HIM  . High cholesterol   .  History of CHF (congestive heart failure) MONITORED BY DR BOUSKA   DIASTOLIC  . Hydrocele, left   . Hypertension   . NSTEMI (non-ST elevated myocardial infarction) ([place]) 12/2015  . Pre-operative clearance    GIVEN BY DR Coletta Memos  . Short of breath on exertion   . Smoker   . Weakness of right side of body    S/P CVA   FEB 2013    Tobacco History: Social History   Tobacco Use  Smoking Status Former Smoker  . Packs/day: 0.00  . Years: 53.00  . Pack years: 0.00  . Types: Cigarettes  . Quit date: 2019  . Years since quitting: 3.1  Smokeless Tobacco Never Used  Tobacco Comment   3-4 cigarettes daily- 08/30/17   Counseling given: Not Answered Comment: 3-4 cigarettes daily- 08/30/17   Outpatient Medications Prior to Visit  Medication Sig Dispense Refill  . albuterol (PROVENTIL) (2.5 MG/3ML) 0.083% nebulizer solution Take 2.5 mg by nebulization every 4 (four) hours as needed for wheezing or shortness of breath.    . Albuterol Sulfate (PROAIR RESPICLICK) 878 (90 BASE) MCG/ACT AEPB Inhale 2 puffs into the lungs 4 (four) times daily as needed. 1 each 5  . amLODipine (NORVASC) 10 MG tablet Take 10 mg by mouth every morning.     Marland Kitchen atorvastatin (LIPITOR) 20 MG tablet Take 20 mg by mouth daily.    . bisoprolol (ZEBETA) 5 MG tablet Take 1 tablet (5 mg total) by mouth daily. 30 tablet 0  . budesonide-formoterol (SYMBICORT) 160-4.5 MCG/ACT inhaler Inhale 2 puffs into the lungs 2 (two) times daily.    . cloNIDine (CATAPRES) 0.3 MG tablet Take 0.3 mg by mouth 2 (two) times  daily.    . Cyanocobalamin (VITAMIN B 12 PO) Take 1 tablet by mouth every morning.     . cyclobenzaprine (FLEXERIL) 5 MG tablet Take 5 mg by mouth at bedtime.    . dabigatran (PRADAXA) 150 MG CAPS capsule Take 150 mg by mouth 2 (two) times daily.    Marland Kitchen dexamethasone (DECADRON) 4 MG tablet Take 1 tablet (4 mg total) by mouth daily. 30 tablet 0  . DULoxetine (CYMBALTA) 30 MG capsule Take 30 mg by mouth every morning.     .  gabapentin (NEURONTIN) 400 MG capsule Take 400 mg by mouth 3 (three) times daily.     . hydrochlorothiazide (MICROZIDE) 12.5 MG capsule Take 12.5 mg by mouth daily.    Marland Kitchen losartan (COZAAR) 100 MG tablet Take 1 tablet (100 mg total) by mouth daily. 90 tablet 3  . metFORMIN (GLUCOPHAGE) 500 MG tablet Take 500 mg by mouth daily with breakfast.    . mupirocin cream (BACTROBAN) 2 % Apply 1 application topically 2 (two) times daily. 15 g 0  . nitroGLYCERIN (NITROSTAT) 0.4 MG SL tablet Place 0.4 mg under the tongue every 5 (five) minutes as needed for chest pain.    . nortriptyline (PAMELOR) 25 MG capsule Take 25 mg by mouth at bedtime.    . pantoprazole (PROTONIX) 20 MG tablet TAKE 1 TABLET BY MOUTH DAILY 90 tablet 2  . prochlorperazine (COMPAZINE) 10 MG tablet Take 1 tablet (10 mg total) by mouth every 6 (six) hours as needed for nausea or vomiting. 30 tablet 0  . tamsulosin (FLOMAX) 0.4 MG CAPS capsule Take 0.4 mg by mouth daily.    . Tiotropium Bromide Monohydrate (SPIRIVA RESPIMAT) 2.5 MCG/ACT AERS Inhale 2 puffs into the lungs daily. 4 g 5   No facility-administered medications prior to visit.      Review of Systems  Review of Systems   Physical Exam  There were no vitals taken for this visit. Physical Exam   Lab Results:  CBC    Component Value Date/Time   WBC 6.9 01/07/2020 0803   WBC 8.6 09/12/2019 1103   RBC 4.33 01/07/2020 0803   HGB 12.2 (L) 01/07/2020 0803   HCT 38.1 (L) 01/07/2020 0803   PLT 267 01/07/2020 0803   MCV 88.0 01/07/2020 0803   MCH 28.2 01/07/2020 0803   MCHC 32.0 01/07/2020 0803   RDW 18.9 (H) 01/07/2020 0803   LYMPHSABS 1.4 01/07/2020 0803   MONOABS 1.1 (H) 01/07/2020 0803   EOSABS 0.1 01/07/2020 0803   BASOSABS 0.1 01/07/2020 0803    BMET    Component Value Date/Time   NA 138 01/07/2020 0803   K 3.7 01/07/2020 0803   CL 100 01/07/2020 0803   CO2 29 01/07/2020 0803   GLUCOSE 151 (H) 01/07/2020 0803   BUN 8 01/07/2020 0803   CREATININE 0.97  01/07/2020 0803   CALCIUM 9.2 01/07/2020 0803   GFRNONAA >60 01/07/2020 0803   GFRAA >60 10/21/2019 1415    BNP    Component Value Date/Time   BNP 70.1 02/10/2016 0818    ProBNP    Component Value Date/Time   PROBNP 56.2 10/09/2013 1828    Imaging: No results found.   Assessment & Plan:   No problem-specific Assessment & Plan notes found for this encounter.     Martyn Ehrich, NP 03/13/2020

## 2020-03-26 ENCOUNTER — Encounter: Payer: Self-pay | Admitting: *Deleted

## 2020-03-29 NOTE — Progress Notes (Signed)
Radiation Oncology         [phone] ________________________________  Name: Jonathon [name] MRN: [id number]  Date: 03/30/2020  DOB: 02-09-1950  Follow-Up Visit Note  CC: [name], MD  [name], MD    ICD-10-CM   1. Primary lung small cell carcinoma, right (HCC)  C34.91   2. 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 No significant interval history since the end of treatment.  On review of systems, he reports resolution of his headaches.  He reports some blurred vision but does admit that he needs new glasses.  He denies any double vision.  He denies any nausea at this time.                        ALLERGIES:  is allergic to atorvastatin.  Meds: Current Outpatient Medications  Medication Sig Dispense Refill  . albuterol (PROVENTIL) (2.5 MG/3ML) 0.083% nebulizer solution Take 2.5 mg by nebulization every 4 (four) hours as needed for wheezing or shortness of breath.    . Albuterol Sulfate (PROAIR RESPICLICK) 585 (90 BASE) MCG/ACT AEPB Inhale 2 puffs into the lungs 4 (four) times daily as needed. 1 each 5  . amLODipine (NORVASC) 10 MG tablet Take 10 mg by mouth every morning.     Marland Kitchen atorvastatin (LIPITOR) 20 MG tablet Take 20 mg by mouth daily.    . bisoprolol (ZEBETA) 5 MG tablet Take 1 tablet (5 mg total) by mouth daily. 30 tablet 0  . budesonide-formoterol (SYMBICORT) 160-4.5 MCG/ACT inhaler Inhale 2 puffs into the lungs 2 (two) times daily.    . cloNIDine (CATAPRES) 0.3 MG tablet Take 0.3 mg by mouth 2 (two) times  daily.    . Cyanocobalamin (VITAMIN B 12 PO) Take 1 tablet by mouth every morning.     . cyclobenzaprine (FLEXERIL) 5 MG tablet Take 5 mg by mouth at bedtime.    . dabigatran (PRADAXA) 150 MG CAPS capsule Take 150 mg by mouth 2 (two) times daily.    . DULoxetine (CYMBALTA) 30 MG capsule Take 30 mg by mouth every morning.     . gabapentin (NEURONTIN) 400 MG capsule Take 400 mg by mouth 3 (three) times daily.     . hydrochlorothiazide (MICROZIDE) 12.5 MG capsule Take 12.5 mg by mouth daily.    Marland Kitchen losartan (COZAAR) 100 MG tablet Take 1 tablet (100 mg total) by mouth daily. 90 tablet 3  . metFORMIN (GLUCOPHAGE) 500 MG tablet Take 500 mg by mouth daily with breakfast.    . mupirocin cream (BACTROBAN) 2 % Apply 1 application topically 2 (two) times daily. 15 g 0  . nitroGLYCERIN (NITROSTAT) 0.4 MG SL tablet Place 0.4 mg under the tongue every 5 (five) minutes as needed for chest pain.    . nortriptyline (PAMELOR) 25 MG capsule Take 25 mg by mouth at bedtime.    . pantoprazole (PROTONIX) 20 MG tablet TAKE 1 TABLET BY MOUTH DAILY 90 tablet 2  . prochlorperazine (COMPAZINE) 10 MG tablet Take 1 tablet (10 mg total) by mouth every  6 (six) hours as needed for nausea or vomiting. 30 tablet 0  . tamsulosin (FLOMAX) 0.4 MG CAPS capsule Take 0.4 mg by mouth daily.    . Tiotropium Bromide Monohydrate (SPIRIVA RESPIMAT) 2.5 MCG/ACT AERS Inhale 2 puffs into the lungs daily. 4 g 5   No current facility-administered medications for this encounter.    Physical Findings: The patient is in no acute distress. Patient is alert and oriented.  height is 6' (1.829 m) and weight is 211 lb (95.7 kg). His temporal temperature is 97 F (36.1 C) (abnormal). His blood pressure is 129/60 and his pulse is 63. His respiration is 22 (abnormal) and oxygen saturation is 97%.  Lungs are clear to auscultation bilaterally. Heart has regular rate and rhythm. No palpable cervical, supraclavicular, or axillary adenopathy. Abdomen soft,  non-tender, normal bowel sounds.  The neurological examination is nonfocal.  The oral cavity is moist without secondary infection.  Lab Findings: Lab Results  Component Value Date   WBC 6.9 01/07/2020   HGB 12.2 (L) 01/07/2020   HCT 38.1 (L) 01/07/2020   MCV 88.0 01/07/2020   PLT 267 01/07/2020    Radiographic Findings: No results found.  Impression: Limited stage (T3, N2, M0) small cell carcinoma of the right lower lobe  The patient completed prophylactic cranial radiation.  He did have some side effects during and after but these issues have resolved at this time.  Plan: The patient is scheduled to follow up with Dr. Julien Nordmann on 04/08/2020. He will follow up with radiation oncology in 3 months.  Patient does wish to be aggressive on any potential brain metastasis and would prefer to have an MRI every 3 months for follow-up rather than scan as symptoms develop.  In light of this he will be scheduled for MRI of the brain with contrast in 3 months and follow-up after this scan.    ____________________________________   Blair Promise, PhD, MD  This document serves as a record of services personally performed by Gery Pray, MD. It was created on his behalf by Clerance Lav, a trained medical scribe. The creation of this record is based on the scribe's personal observations and the provider's statements to them. This document has been checked and approved by the attending provider.

## 2020-03-30 ENCOUNTER — Other Ambulatory Visit: Payer: Self-pay

## 2020-03-30 ENCOUNTER — Encounter: Payer: Self-pay | Admitting: Radiation Oncology

## 2020-03-30 ENCOUNTER — Ambulatory Visit
Admission: RE | Admit: 2020-03-30 | Discharge: 2020-03-30 | Disposition: A | Discharge status: Home / Self Care | Payer: Medicare Other | Source: Ambulatory Visit | Attending: Radiation Oncology | Admitting: Radiation Oncology

## 2020-03-30 DIAGNOSIS — Z7984 Long term (current) use of oral hypoglycemic drugs: Secondary | ICD-10-CM | POA: Insufficient documentation

## 2020-03-30 DIAGNOSIS — Z923 Personal history of irradiation: Secondary | ICD-10-CM | POA: Insufficient documentation

## 2020-03-30 DIAGNOSIS — Z298 Encounter for other specified prophylactic measures: Secondary | ICD-10-CM | POA: Diagnosis present

## 2020-03-30 DIAGNOSIS — C3491 Malignant neoplasm of unspecified part of right bronchus or lung: Secondary | ICD-10-CM

## 2020-03-30 DIAGNOSIS — C3431 Malignant neoplasm of lower lobe, right bronchus or lung: Secondary | ICD-10-CM | POA: Diagnosis not present

## 2020-03-30 DIAGNOSIS — Z79899 Other long term (current) drug therapy: Secondary | ICD-10-CM | POA: Insufficient documentation

## 2020-03-30 DIAGNOSIS — C3490 Malignant neoplasm of unspecified part of unspecified bronchus or lung: Secondary | ICD-10-CM

## 2020-03-30 NOTE — Progress Notes (Incomplete)
  Patient Name: Jonathon [name] MRN: [id number] DOB: [birth year] Referring Physician: [name] (Profile Not Attached) Date of Service: 02/28/2020 [facility] [place]                                                        End Of Treatment Note  Diagnoses: C34.31-Malignant neoplasm of lower lobe, right bronchus or lung C34.91-Malignant neoplasm of unspecified part of right bronchus or lung R91.1-Solitary pulmonary nodule  Cancer Staging: Limited stage (T3, N2, M0) small cell carcinoma of the right lower lobe  Intent: Curative  Radiation Treatment Dates: 02/17/2020 through 02/28/2020  Site: Brain Technique: Complex Total Dose (Gy): 25/25 Dose per Fx (Gy): 2.5 Completed Fx: 10/10 Beam Energies: 6X  Narrative: The patient tolerated radiation therapy relatively well. He did report some dizziness, fatigue, shortness of breath with minimal exertion, productive cough with white sputum, nausea, and headaches. Appetite remained stable. The scalp area showed no significant erythema or hair loss. He was started on Decadron to help alleviate potential swelling of the brain.  Plan: The patient will follow-up with radiation oncology in one month.  ________________________________________________   Blair Promise, PhD, MD  This document serves as a record of services personally performed by Gery Pray, MD. It was created on his behalf by Clerance Lav, a trained medical scribe. The creation of this record is based on the scribe's personal observations and the provider's statements to them. This document has been checked and approved by the attending provider.

## 2020-03-30 NOTE — Progress Notes (Signed)
Patient is here today for 1 month follow up post whole radiation to the brain.  Radiation completed 02/28/2020.  Patient denies having any pain today.  Patient denies having any headache.  Patient reports having blurry vision.  States he is supposed to wear glasses and does not.  Denies ringing in the ears.  Reports watering of the eyes.  Uses a cane while ambulating, reports some balance problems but denies having any falls.  Denies nausea or vomiting.  Vitals:   03/30/20 1516  BP: 129/60  Pulse: 63  Resp: (!) 22  Temp: (!) 97 F (36.1 C)  TempSrc: Temporal  SpO2: 97%  Weight: 211 lb (95.7 kg)  Height: 6' (1.829 m)

## 2020-04-06 ENCOUNTER — Inpatient Hospital Stay: Payer: Medicare Other | Attending: Physician Assistant

## 2020-04-06 ENCOUNTER — Ambulatory Visit (HOSPITAL_COMMUNITY)
Admission: RE | Admit: 2020-04-06 | Discharge: 2020-04-06 | Disposition: A | Discharge status: Home / Self Care | Payer: Medicare Other | Source: Ambulatory Visit | Attending: Internal Medicine | Admitting: Internal Medicine

## 2020-04-06 ENCOUNTER — Other Ambulatory Visit: Payer: Self-pay

## 2020-04-06 ENCOUNTER — Encounter (HOSPITAL_COMMUNITY): Payer: Self-pay

## 2020-04-06 DIAGNOSIS — C3490 Malignant neoplasm of unspecified part of unspecified bronchus or lung: Secondary | ICD-10-CM

## 2020-04-06 DIAGNOSIS — I639 Cerebral infarction, unspecified: Secondary | ICD-10-CM | POA: Insufficient documentation

## 2020-04-06 DIAGNOSIS — J449 Chronic obstructive pulmonary disease, unspecified: Secondary | ICD-10-CM | POA: Insufficient documentation

## 2020-04-06 DIAGNOSIS — Z923 Personal history of irradiation: Secondary | ICD-10-CM | POA: Diagnosis not present

## 2020-04-06 DIAGNOSIS — Z79899 Other long term (current) drug therapy: Secondary | ICD-10-CM | POA: Diagnosis not present

## 2020-04-06 DIAGNOSIS — E119 Type 2 diabetes mellitus without complications: Secondary | ICD-10-CM | POA: Diagnosis not present

## 2020-04-06 DIAGNOSIS — Z9221 Personal history of antineoplastic chemotherapy: Secondary | ICD-10-CM | POA: Insufficient documentation

## 2020-04-06 DIAGNOSIS — I509 Heart failure, unspecified: Secondary | ICD-10-CM | POA: Insufficient documentation

## 2020-04-06 DIAGNOSIS — C3491 Malignant neoplasm of unspecified part of right bronchus or lung: Secondary | ICD-10-CM | POA: Insufficient documentation

## 2020-04-06 DIAGNOSIS — I252 Old myocardial infarction: Secondary | ICD-10-CM | POA: Diagnosis not present

## 2020-04-06 HISTORY — DX: Malignant (primary) neoplasm, unspecified: C80.1

## 2020-04-06 LAB — CMP (CANCER CENTER ONLY)
ALT: 12 U/L (ref 0–44)
AST: 11 U/L — ABNORMAL LOW (ref 15–41)
Albumin: 3.4 g/dL — ABNORMAL LOW (ref 3.5–5.0)
Alkaline Phosphatase: 87 U/L (ref 38–126)
Anion gap: 7 (ref 5–15)
BUN: 7 mg/dL — ABNORMAL LOW (ref 8–23)
CO2: 29 mmol/L (ref 22–32)
Calcium: 9.2 mg/dL (ref 8.9–10.3)
Chloride: 95 mmol/L — ABNORMAL LOW (ref 98–111)
Creatinine: 1.04 mg/dL (ref 0.61–1.24)
GFR, Estimated: >60 mL/min (ref >60)
Glucose, Bld: 179 mg/dL — ABNORMAL HIGH (ref 70–99)
Potassium: 3.1 mmol/L — ABNORMAL LOW (ref 3.5–5.1)
Sodium: 131 mmol/L — ABNORMAL LOW (ref 135–145)
Total Bilirubin: 0.3 mg/dL (ref 0.3–1.2)
Total Protein: 7.6 g/dL (ref 6.5–8.1)

## 2020-04-06 LAB — CBC WITH DIFFERENTIAL (CANCER CENTER ONLY)
Abs Immature Granulocytes: 0.08 10*3/uL — ABNORMAL HIGH (ref 0.00–0.07)
Basophils Absolute: 0.1 10*3/uL (ref 0.0–0.1)
Basophils Relative: 1 %
Eosinophils Absolute: 0.1 10*3/uL (ref 0.0–0.5)
Eosinophils Relative: 1 %
HCT: 42.1 % (ref 39.0–52.0)
Hemoglobin: 13.7 g/dL (ref 13.0–17.0)
Immature Granulocytes: 1 %
Lymphocytes Relative: 23 %
Lymphs Abs: 2 10*3/uL (ref 0.7–4.0)
MCH: 27.7 pg (ref 26.0–34.0)
MCHC: 32.5 g/dL (ref 30.0–36.0)
MCV: 85.2 fL (ref 80.0–100.0)
Monocytes Absolute: 1 10*3/uL (ref 0.1–1.0)
Monocytes Relative: 11 %
Neutro Abs: 5.4 10*3/uL (ref 1.7–7.7)
Neutrophils Relative %: 63 %
Platelet Count: 315 10*3/uL (ref 150–400)
RBC: 4.94 MIL/uL (ref 4.22–5.81)
RDW: 14.5 % (ref 11.5–15.5)
WBC Count: 8.6 10*3/uL (ref 4.0–10.5)
nRBC: 0 % (ref 0.0–0.2)

## 2020-04-06 MED ORDER — IOHEXOL 300 MG/ML  SOLN
75.0000 mL | Freq: Once | INTRAMUSCULAR | Status: AC | PRN
  Administered 2020-04-06: 75 mL via INTRAVENOUS

## 2020-04-07 ENCOUNTER — Other Ambulatory Visit (HOSPITAL_COMMUNITY): Payer: Self-pay | Admitting: Family Medicine

## 2020-04-08 ENCOUNTER — Other Ambulatory Visit: Payer: Self-pay | Admitting: Medical Oncology

## 2020-04-08 ENCOUNTER — Inpatient Hospital Stay (HOSPITAL_BASED_OUTPATIENT_CLINIC_OR_DEPARTMENT_OTHER): Payer: Medicare Other | Admitting: Internal Medicine

## 2020-04-08 ENCOUNTER — Telehealth: Payer: Self-pay | Admitting: Internal Medicine

## 2020-04-08 ENCOUNTER — Other Ambulatory Visit: Payer: Self-pay

## 2020-04-08 VITALS — BP 142/70 | HR 72 | Temp 97.8°F | Resp 16 | Ht 72.0 in | Wt 211.5 lb

## 2020-04-08 DIAGNOSIS — C3491 Malignant neoplasm of unspecified part of right bronchus or lung: Secondary | ICD-10-CM

## 2020-04-08 DIAGNOSIS — C3490 Malignant neoplasm of unspecified part of unspecified bronchus or lung: Secondary | ICD-10-CM | POA: Diagnosis not present

## 2020-04-08 NOTE — Telephone Encounter (Signed)
Scheduled appt per 3/23 los - gave patient AVS and calender

## 2020-04-08 NOTE — Progress Notes (Signed)
[place] [phone]   [phone]  OFFICE PROGRESS NOTE  [name], MD [place] [address] [place] [address]  DIAGNOSIS:  limited stage (T3, N2, M0) small cell lung cancer presented with pulmonary nodules and pleural-based nodules in the right lung in addition to mediastinal lymphadenopathy diagnosed in August 2021.  The patient was previously treated without biopsy to a pleural-based nodule in the right lower lobe with SBRT in March 2021 but had evidence for disease recurrence and progression.  PRIOR THERAPY:  1) SBRT to right lower lobe pulmonary nodule in March 2021 under the care of Dr. Sondra Come. 2) Systemic chemotherapy with carboplatin for AUC of 5 on day 1 and etoposide 100 mg/M2 on days 1, 2 and 3 with Neulasta support every 3 weeks.  First cycle 10/07/2019.  Status post 4 cycles.  CURRENT THERAPY: Observation.  INTERVAL HISTORY: Jonathon [name] 71 y.o. male returns to the clinic today for follow-up visit accompanied by his wife.  The patient is feeling fine today with no concerning complaints except for shortness of breath with exertion especially when he has a bowel movement.  The patient denied having any current chest pain, cough or hemoptysis.  He denied having any fever or chills.  He has no nausea, vomiting, diarrhea or constipation.  He denied having any headache or visual changes.  He tolerated the previous course of chemotherapy fairly well.  He had repeat CT scan of the chest performed recently and is here for evaluation and discussion of his scan results.  MEDICAL HISTORY: Past Medical History:  Diagnosis Date  . Cancer ([place])   . CHF (congestive heart failure) ([place])   . Chronic cough   . COPD, severe ([place])    PT DENIES REGULAR DAILY INHALER ANY PRN  . Coronary artery disease   . CVA (cerebral vascular accident) ([place]) 02/22/2011   LEFT BRAIN STEM PONTINE HEMORRHAGE--  RIGHT SIDED WEAKNESS  . Diabetes  mellitus without complication ([place])    type 2  . Dyspnea   . GERD (gastroesophageal reflux disease)   . Harsh voice quality    PT STATES NORMAL FOR HIM  . High cholesterol   . History of CHF (congestive heart failure) MONITORED BY DR BOUSKA   DIASTOLIC  . History of radiation therapy 03/13/19-03/20/19   SBRT Right Lung; Dr. Gery Pray  . History of radiation therapy 02/17/20-02/28/20   Whole brain radiation; Dr. Gery Pray  . Hydrocele, left   . Hypertension   . NSTEMI (non-ST elevated myocardial infarction) ([place]) 12/2015  . Pre-operative clearance    GIVEN BY DR Coletta Memos  . Short of breath on exertion   . Smoker   . Weakness of right side of body    S/P CVA   FEB 2013    ALLERGIES:  is allergic to atorvastatin.  MEDICATIONS:  Current Outpatient Medications  Medication Sig Dispense Refill  . albuterol (PROVENTIL) (2.5 MG/3ML) 0.083% nebulizer solution Take 2.5 mg by nebulization every 4 (four) hours as needed for wheezing or shortness of breath.    . Albuterol Sulfate (PROAIR RESPICLICK) 027 (90 BASE) MCG/ACT AEPB Inhale 2 puffs into the lungs 4 (four) times daily as needed. 1 each 5  . amLODipine (NORVASC) 10 MG tablet Take 10 mg by mouth every morning.     Marland Kitchen atorvastatin (LIPITOR) 20 MG tablet Take 20 mg by mouth daily.    . bisoprolol (ZEBETA) 5 MG tablet Take 1 tablet (5  mg total) by mouth daily. 30 tablet 0  . budesonide-formoterol (SYMBICORT) 160-4.5 MCG/ACT inhaler Inhale 2 puffs into the lungs 2 (two) times daily.    . cloNIDine (CATAPRES) 0.3 MG tablet Take 0.3 mg by mouth 2 (two) times daily.    . Cyanocobalamin (VITAMIN B 12 PO) Take 1 tablet by mouth every morning.     . cyclobenzaprine (FLEXERIL) 5 MG tablet Take 5 mg by mouth at bedtime.    . dabigatran (PRADAXA) 150 MG CAPS capsule Take 150 mg by mouth 2 (two) times daily.    . DULoxetine (CYMBALTA) 30 MG capsule Take 30 mg by mouth every morning.     . gabapentin (NEURONTIN) 400 MG capsule Take 400 mg by  mouth 3 (three) times daily.     . hydrochlorothiazide (MICROZIDE) 12.5 MG capsule Take 12.5 mg by mouth daily.    Marland Kitchen losartan (COZAAR) 100 MG tablet Take 1 tablet (100 mg total) by mouth daily. 90 tablet 3  . metFORMIN (GLUCOPHAGE) 500 MG tablet Take 500 mg by mouth daily with breakfast.    . mupirocin cream (BACTROBAN) 2 % Apply 1 application topically 2 (two) times daily. 15 g 0  . nitroGLYCERIN (NITROSTAT) 0.4 MG SL tablet Place 0.4 mg under the tongue every 5 (five) minutes as needed for chest pain.    . nortriptyline (PAMELOR) 25 MG capsule Take 25 mg by mouth at bedtime.    . pantoprazole (PROTONIX) 20 MG tablet TAKE 1 TABLET BY MOUTH DAILY 90 tablet 2  . prochlorperazine (COMPAZINE) 10 MG tablet Take 1 tablet (10 mg total) by mouth every 6 (six) hours as needed for nausea or vomiting. 30 tablet 0  . tamsulosin (FLOMAX) 0.4 MG CAPS capsule Take 0.4 mg by mouth daily.    . Tiotropium Bromide Monohydrate (SPIRIVA RESPIMAT) 2.5 MCG/ACT AERS Inhale 2 puffs into the lungs daily. 4 g 5   No current facility-administered medications for this visit.    SURGICAL HISTORY:  Past Surgical History:  Procedure Laterality Date  . ABDOMINAL ANGIOGRAM  09/04/2012   Procedure: ABDOMINAL ANGIOGRAM;  Surgeon: Laverda Page, MD;  [facility] CATH LAB;  Service: Cardiovascular;;  . CARDIAC CATHETERIZATION  05-02-2011  DR Johnsie Cancel   MILD NONOBSTRUCTIVE CAD/ LAD, OM , AND D1/ EF 60^  . CARDIAC CATHETERIZATION  AUG 2000   NON-OBSTRUTIVE CAD/ EF 68%/ 25% PROXIMAL LAD/ 20% MID CIRCUMFLEX  . CARDIAC CATHETERIZATION [name] 12/23/2015   Procedure: Left Heart Cath and Coronary Angiography;  Surgeon: Adrian Prows, MD;  Location: [place] CV LAB;  Service: Cardiovascular;  Laterality: [name];  . CARDIAC CATHETERIZATION [name] 12/24/2015   Procedure: Coronary Stent Intervention;  Surgeon: Adrian Prows, MD;  Location: [place] CV LAB;  Service: Cardiovascular;  Laterality: [name];  . CORONARY ANGIOPLASTY    . CORONARY STENT  PLACEMENT  12/24/2015    PTCA and stenting of the distal RCA   . HYDROCELE EXCISION Left 04/24/2012   Procedure: HYDROCELECTOMY ADULT;  Surgeon: Fredricka Bonine, MD;  Location: [facility];  Service: Urology;  Laterality: Left;  90 mins req for this case     . [place]  . LEFT AND RIGHT HEART CATHETERIZATION WITH CORONARY ANGIOGRAM [name] 09/04/2012   Procedure: LEFT AND RIGHT HEART CATHETERIZATION WITH CORONARY ANGIOGRAM;  Surgeon: Laverda Page, MD;  Location: [facility] CATH LAB;  Service: Cardiovascular;  Laterality: [name];  . LEFT HEART CATHETERIZATION WITH CORONARY ANGIOGRAM [name] 05/02/2011   Procedure: LEFT HEART CATHETERIZATION WITH  CORONARY ANGIOGRAM;  Surgeon: Josue Hector, MD;  Location: [facility] CATH LAB;  Service: Cardiovascular;  Laterality: [name];  . THORACOTOMY/LOBECTOMY Right 03-06-2008   AND STAPLING OF BLEBS FOR SPONTANOUS PNEUMOTHORAX  . TRANSTHORACIC ECHOCARDIOGRAM  02-17-2011   MODERATE LVH/ LVSF NORMAL/ EF 71-06%/ GRADE I DIASTOLIC DYSFUNCTION    REVIEW OF SYSTEMS:  Constitutional: positive for fatigue Eyes: negative Ears, nose, mouth, throat, and face: negative Respiratory: positive for dyspnea on exertion Cardiovascular: negative Gastrointestinal: negative Genitourinary:negative Integument/breast: negative Hematologic/lymphatic: negative Musculoskeletal:negative Neurological: negative Behavioral/Psych: negative Endocrine: negative Allergic/Immunologic: negative   PHYSICAL EXAMINATION: General appearance: alert, cooperative, fatigued and no distress Head: Normocephalic, without obvious abnormality, atraumatic Neck: no adenopathy, no JVD, supple, symmetrical, trachea midline and thyroid not enlarged, symmetric, no tenderness/mass/nodules Lymph nodes: Cervical, supraclavicular, and axillary nodes normal. Resp: clear to auscultation bilaterally Back: symmetric, no curvature. ROM normal. No CVA tenderness. Cardio: regular rate and  rhythm, S1, S2 normal, no murmur, click, rub or gallop GI: soft, non-tender; bowel sounds normal; no masses,  no organomegaly Extremities: extremities normal, atraumatic, no cyanosis or edema Neurologic: Alert and oriented X 3, normal strength and tone. Normal symmetric reflexes. Normal coordination and gait  ECOG PERFORMANCE STATUS: 1 - Symptomatic but completely ambulatory  There were no vitals taken for this visit.  LABORATORY DATA: Lab Results  Component Value Date   WBC 8.6 04/06/2020   HGB 13.7 04/06/2020   HCT 42.1 04/06/2020   MCV 85.2 04/06/2020   PLT 315 04/06/2020      Chemistry      Component Value Date/Time   NA 131 (L) 04/06/2020 0954   K 3.1 (L) 04/06/2020 0954   CL 95 (L) 04/06/2020 0954   CO2 29 04/06/2020 0954   BUN 7 (L) 04/06/2020 0954   CREATININE 1.04 04/06/2020 0954      Component Value Date/Time   CALCIUM 9.2 04/06/2020 0954   ALKPHOS 87 04/06/2020 0954   AST 11 (L) 04/06/2020 0954   ALT 12 04/06/2020 0954   BILITOT 0.3 04/06/2020 0954       RADIOGRAPHIC STUDIES: CT Chest W Contrast  Result Date: 04/06/2020 CLINICAL DATA:  Small-cell lung cancer.  Restaging. EXAM: CT CHEST WITH CONTRAST TECHNIQUE: Multidetector CT imaging of the chest was performed during intravenous contrast administration. CONTRAST:  43mL OMNIPAQUE IOHEXOL 300 MG/ML  SOLN COMPARISON:  01/01/2020 FINDINGS: Cardiovascular: The heart size is normal. No substantial pericardial effusion. Coronary artery calcification is evident. Atherosclerotic calcification is noted in the wall of the thoracic aorta. Mediastinum/Nodes: No mediastinal lymphadenopathy. There is no hilar lymphadenopathy. The esophagus has normal imaging features. There is no axillary lymphadenopathy. Lungs/Pleura: Extensive changes of emphysema with bullous disease in the apices bilaterally. 2.3 cm nodular opacity in the medial left upper lobe is stable, previously characterized as scarring (105/5). 7 mm right lower lobe  subpleural nodule described previously is almost imperceptible today (image 108/5). 9 mm right upper lobe nodule (40/5) was measured previously at 5 mm. Qualitatively this nodule has more convex margins today than before. 3 mm anterior right lower lobe nodule on 84/5 is new in the interval. Upper Abdomen: 13 mm calcified gallstone evident. 3 cm well-defined homogeneous low-density lesion upper pole left kidney is compatible with a cyst. Musculoskeletal: No worrisome lytic or sclerotic osseous abnormality. IMPRESSION: 1. Interval increase in size of the 9 mm right upper lobe pulmonary nodule with more convex margins today. Imaging features raise concern for neoplasm. Close follow-up recommended with repeat CT chest in 3 months. 2. New 3  mm anterior right lower lobe pulmonary nodule. Attention on follow-up recommended. 3. Stable 2.3 cm medial left upper lobe nodular opacity, previously characterized as scarring. 4. Cholelithiasis. 5. Aortic Atherosclerosis (ICD10-I70.0) and Emphysema (ICD10-J43.9). Electronically Signed   By: Misty Stanley [name]   On: 04/06/2020 11:30    ASSESSMENT AND PLAN: This is a very pleasant 70 years old white male recently diagnosed with limited stage small cell lung cancer (T3, N2, M0) presented with pleural-based pulmonary nodules in the right lung in addition to mediastinal lymphadenopathy in August 2021.  The patient was initially diagnosed with a pleural-based nodule status post SBRT in March 2021. He completed systemic chemotherapy with carboplatin for AUC of 5 from day 1 and to etoposide 100 mg/M2 on days 1, 2 and 3 with Neulasta support every 3 weeks.  Status post 4 cycles. The patient tolerated his treatment well except for fatigue. He had repeat CT scan of the chest performed recently.  I personally and independently reviewed the scans and discussed the results with the patient and his wife today. His scan showed further improvement of his disease. I recommended for the  patient to continue on observation from now on with close monitoring. I will see him back for follow-up visit in 3 months with repeat CT scan of the chest for restaging of his disease. I will refer the patient back to Dr. Sondra Come for consideration of prophylactic cranial irradiation. For the shortness of breath with exertion, I advised the patient to see his cardiologist for evaluation in the absence of any clear findings on the CT scan of the chest except for his chronic COPD. The patient was advised to call immediately if he has any other concerning symptoms in the interval.  The patient voices understanding of current disease status and treatment options and is in agreement with the current care plan.  All questions were answered. The patient knows to call the clinic with any problems, questions or concerns. We can certainly see the patient much sooner if necessary.  Disclaimer: This note was dictated with voice recognition software. Similar sounding words can inadvertently be transcribed and may not be corrected upon review.             [place] [phone]) [phone]   Fax:(336) [phone]  OFFICE PROGRESS NOTE  [name], MD [place] [address] [place] [address]  DIAGNOSIS:  limited stage (T3, N2, M0) small cell lung cancer presented with pulmonary nodules and pleural-based nodules in the right lung in addition to mediastinal lymphadenopathy diagnosed in August 2021.  The patient was previously treated without biopsy to a pleural-based nodule in the right lower lobe with SBRT in March 2021 but had evidence for disease recurrence and progression.  PRIOR THERAPY:  1) SBRT to right lower lobe pulmonary nodule in March 2021 under the care of Dr. Sondra Come. 2) Systemic chemotherapy with carboplatin for AUC of 5 on day 1 and etoposide 100 mg/M2 on days 1, 2 and 3 with Neulasta support every 3 weeks.  First cycle 10/07/2019.  Status post 4 cycles. 3)  status post prophylactic cranial irradiation under the care of Dr. Sondra Come.  CURRENT THERAPY: Observation.  INTERVAL HISTORY: Jonathon [name] 70 y.o. male returns to the clinic today for follow-up visit accompanied by his wife.  The patient is feeling fine today with no concerning complaints except for increasing fatigue after the prophylactic cranial irradiation.  He denied having any current chest pain but has shortness of breath  with exertion with mild cough and no hemoptysis.  He denied having any fever or chills.  He has no nausea, vomiting, diarrhea or constipation.  He has no headache or visual changes.  He underwent prophylactic cranial irradiation completed 2 weeks ago.  The patient had repeat CT scan of the chest performed recently and he is here for evaluation and discussion of his discuss results.  MEDICAL HISTORY: Past Medical History:  Diagnosis Date  . Cancer ([place])   . CHF (congestive heart failure) ([place])   . Chronic cough   . COPD, severe ([place])    PT DENIES REGULAR DAILY INHALER ANY PRN  . Coronary artery disease   . CVA (cerebral vascular accident) ([place]) 02/22/2011   LEFT BRAIN STEM PONTINE HEMORRHAGE--  RIGHT SIDED WEAKNESS  . Diabetes mellitus without complication ([place])    type 2  . Dyspnea   . GERD (gastroesophageal reflux disease)   . Harsh voice quality    PT STATES NORMAL FOR HIM  . High cholesterol   . History of CHF (congestive heart failure) MONITORED BY DR BOUSKA   DIASTOLIC  . History of radiation therapy 03/13/19-03/20/19   SBRT Right Lung; Dr. Gery Pray  . History of radiation therapy 02/17/20-02/28/20   Whole brain radiation; Dr. Gery Pray  . Hydrocele, left   . Hypertension   . NSTEMI (non-ST elevated myocardial infarction) ([place]) 12/2015  . Pre-operative clearance    GIVEN BY DR Coletta Memos  . Short of breath on exertion   . Smoker   . Weakness of right side of body    S/P CVA   FEB 2013    ALLERGIES:  is allergic to  atorvastatin.  MEDICATIONS:  Current Outpatient Medications  Medication Sig Dispense Refill  . albuterol (PROVENTIL) (2.5 MG/3ML) 0.083% nebulizer solution Take 2.5 mg by nebulization every 4 (four) hours as needed for wheezing or shortness of breath.    . Albuterol Sulfate (PROAIR RESPICLICK) 161 (90 BASE) MCG/ACT AEPB Inhale 2 puffs into the lungs 4 (four) times daily as needed. 1 each 5  . amLODipine (NORVASC) 10 MG tablet Take 10 mg by mouth every morning.     Marland Kitchen atorvastatin (LIPITOR) 20 MG tablet Take 20 mg by mouth daily.    . bisoprolol (ZEBETA) 5 MG tablet Take 1 tablet (5 mg total) by mouth daily. 30 tablet 0  . budesonide-formoterol (SYMBICORT) 160-4.5 MCG/ACT inhaler Inhale 2 puffs into the lungs 2 (two) times daily.    . cloNIDine (CATAPRES) 0.3 MG tablet Take 0.3 mg by mouth 2 (two) times daily.    . Cyanocobalamin (VITAMIN B 12 PO) Take 1 tablet by mouth every morning.     . cyclobenzaprine (FLEXERIL) 5 MG tablet Take 5 mg by mouth at bedtime.    . dabigatran (PRADAXA) 150 MG CAPS capsule Take 150 mg by mouth 2 (two) times daily.    . DULoxetine (CYMBALTA) 30 MG capsule Take 30 mg by mouth every morning.     . gabapentin (NEURONTIN) 400 MG capsule Take 400 mg by mouth 3 (three) times daily.     . hydrochlorothiazide (MICROZIDE) 12.5 MG capsule Take 12.5 mg by mouth daily.    Marland Kitchen losartan (COZAAR) 100 MG tablet Take 1 tablet (100 mg total) by mouth daily. 90 tablet 3  . metFORMIN (GLUCOPHAGE) 500 MG tablet Take 500 mg by mouth daily with breakfast.    . mupirocin cream (BACTROBAN) 2 % Apply 1 application topically 2 (two) times daily. 15 g 0  .  nitroGLYCERIN (NITROSTAT) 0.4 MG SL tablet Place 0.4 mg under the tongue every 5 (five) minutes as needed for chest pain.    . nortriptyline (PAMELOR) 25 MG capsule Take 25 mg by mouth at bedtime.    . pantoprazole (PROTONIX) 20 MG tablet TAKE 1 TABLET BY MOUTH DAILY 90 tablet 2  . prochlorperazine (COMPAZINE) 10 MG tablet Take 1 tablet (10  mg total) by mouth every 6 (six) hours as needed for nausea or vomiting. 30 tablet 0  . tamsulosin (FLOMAX) 0.4 MG CAPS capsule Take 0.4 mg by mouth daily.    . Tiotropium Bromide Monohydrate (SPIRIVA RESPIMAT) 2.5 MCG/ACT AERS Inhale 2 puffs into the lungs daily. 4 g 5   No current facility-administered medications for this visit.    SURGICAL HISTORY:  Past Surgical History:  Procedure Laterality Date  . ABDOMINAL ANGIOGRAM  09/04/2012   Procedure: ABDOMINAL ANGIOGRAM;  Surgeon: Laverda Page, MD;  Location: [facility] CATH LAB;  Service: Cardiovascular;;  . CARDIAC CATHETERIZATION  05-02-2011  DR Johnsie Cancel   MILD NONOBSTRUCTIVE CAD/ LAD, OM , AND D1/ EF 60^  . CARDIAC CATHETERIZATION  AUG 2000   NON-OBSTRUTIVE CAD/ EF 68%/ 25% PROXIMAL LAD/ 20% MID CIRCUMFLEX  . CARDIAC CATHETERIZATION [name] 12/23/2015   Procedure: Left Heart Cath and Coronary Angiography;  Surgeon: Adrian Prows, MD;  Location: [place] CV LAB;  Service: Cardiovascular;  Laterality: [name];  . CARDIAC CATHETERIZATION [name] 12/24/2015   Procedure: Coronary Stent Intervention;  Surgeon: Adrian Prows, MD;  Location: [place] CV LAB;  Service: Cardiovascular;  Laterality: [name];  . CORONARY ANGIOPLASTY    . CORONARY STENT PLACEMENT  12/24/2015    PTCA and stenting of the distal RCA   . HYDROCELE EXCISION Left 04/24/2012   Procedure: HYDROCELECTOMY ADULT;  Surgeon: Fredricka Bonine, MD;  Location: [facility];  Service: Urology;  Laterality: Left;  90 mins req for this case     . [place]  . LEFT AND RIGHT HEART CATHETERIZATION WITH CORONARY ANGIOGRAM [name] 09/04/2012   Procedure: LEFT AND RIGHT HEART CATHETERIZATION WITH CORONARY ANGIOGRAM;  Surgeon: Laverda Page, MD;  Location: [facility] CATH LAB;  Service: Cardiovascular;  Laterality: [name];  . LEFT HEART CATHETERIZATION WITH CORONARY ANGIOGRAM [name] 05/02/2011   Procedure: LEFT HEART CATHETERIZATION WITH CORONARY ANGIOGRAM;  Surgeon: Josue Hector, MD;   Location: [facility] CATH LAB;  Service: Cardiovascular;  Laterality: [name];  . THORACOTOMY/LOBECTOMY Right 03-06-2008   AND STAPLING OF BLEBS FOR SPONTANOUS PNEUMOTHORAX  . TRANSTHORACIC ECHOCARDIOGRAM  02-17-2011   MODERATE LVH/ LVSF NORMAL/ EF 16-10%/ GRADE I DIASTOLIC DYSFUNCTION    REVIEW OF SYSTEMS:  Constitutional: positive for fatigue Eyes: negative Ears, nose, mouth, throat, and face: negative Respiratory: positive for cough and dyspnea on exertion Cardiovascular: negative Gastrointestinal: negative Genitourinary:negative Integument/breast: negative Hematologic/lymphatic: negative Musculoskeletal:negative Neurological: negative Behavioral/Psych: negative Endocrine: negative Allergic/Immunologic: negative   PHYSICAL EXAMINATION: General appearance: alert, cooperative, fatigued and no distress Head: Normocephalic, without obvious abnormality, atraumatic Neck: no adenopathy, no JVD, supple, symmetrical, trachea midline and thyroid not enlarged, symmetric, no tenderness/mass/nodules Lymph nodes: Cervical, supraclavicular, and axillary nodes normal. Resp: clear to auscultation bilaterally Back: symmetric, no curvature. ROM normal. No CVA tenderness. Cardio: regular rate and rhythm, S1, S2 normal, no murmur, click, rub or gallop GI: soft, non-tender; bowel sounds normal; no masses,  no organomegaly Extremities: extremities normal, atraumatic, no cyanosis or edema Neurologic: Alert and oriented X 3, normal strength and tone. Normal symmetric reflexes. Normal coordination and gait  ECOG PERFORMANCE STATUS: 1 - Symptomatic but completely ambulatory  Blood pressure (!) 142/70, pulse 72, temperature 97.8 F (36.6 C), temperature source Tympanic, resp. rate 16, height 6' (1.829 m), weight 211 lb 8 oz (95.9 kg), SpO2 92 %.  LABORATORY DATA: Lab Results  Component Value Date   WBC 8.6 04/06/2020   HGB 13.7 04/06/2020   HCT 42.1 04/06/2020   MCV 85.2 04/06/2020   PLT 315 04/06/2020       Chemistry      Component Value Date/Time   NA 131 (L) 04/06/2020 0954   K 3.1 (L) 04/06/2020 0954   CL 95 (L) 04/06/2020 0954   CO2 29 04/06/2020 0954   BUN 7 (L) 04/06/2020 0954   CREATININE 1.04 04/06/2020 0954      Component Value Date/Time   CALCIUM 9.2 04/06/2020 0954   ALKPHOS 87 04/06/2020 0954   AST 11 (L) 04/06/2020 0954   ALT 12 04/06/2020 0954   BILITOT 0.3 04/06/2020 0954       RADIOGRAPHIC STUDIES: CT Chest W Contrast  Result Date: 04/06/2020 CLINICAL DATA:  Small-cell lung cancer.  Restaging. EXAM: CT CHEST WITH CONTRAST TECHNIQUE: Multidetector CT imaging of the chest was performed during intravenous contrast administration. CONTRAST:  65mL OMNIPAQUE IOHEXOL 300 MG/ML  SOLN COMPARISON:  01/01/2020 FINDINGS: Cardiovascular: The heart size is normal. No substantial pericardial effusion. Coronary artery calcification is evident. Atherosclerotic calcification is noted in the wall of the thoracic aorta. Mediastinum/Nodes: No mediastinal lymphadenopathy. There is no hilar lymphadenopathy. The esophagus has normal imaging features. There is no axillary lymphadenopathy. Lungs/Pleura: Extensive changes of emphysema with bullous disease in the apices bilaterally. 2.3 cm nodular opacity in the medial left upper lobe is stable, previously characterized as scarring (105/5). 7 mm right lower lobe subpleural nodule described previously is almost imperceptible today (image 108/5). 9 mm right upper lobe nodule (40/5) was measured previously at 5 mm. Qualitatively this nodule has more convex margins today than before. 3 mm anterior right lower lobe nodule on 84/5 is new in the interval. Upper Abdomen: 13 mm calcified gallstone evident. 3 cm well-defined homogeneous low-density lesion upper pole left kidney is compatible with a cyst. Musculoskeletal: No worrisome lytic or sclerotic osseous abnormality. IMPRESSION: 1. Interval increase in size of the 9 mm right upper lobe pulmonary nodule  with more convex margins today. Imaging features raise concern for neoplasm. Close follow-up recommended with repeat CT chest in 3 months. 2. New 3 mm anterior right lower lobe pulmonary nodule. Attention on follow-up recommended. 3. Stable 2.3 cm medial left upper lobe nodular opacity, previously characterized as scarring. 4. Cholelithiasis. 5. Aortic Atherosclerosis (ICD10-I70.0) and Emphysema (ICD10-J43.9). Electronically Signed   By: Misty Stanley [name]   On: 04/06/2020 11:30    ASSESSMENT AND PLAN: This is a very pleasant 70 years old white male recently diagnosed with limited stage small cell lung cancer (T3, N2, M0) presented with pleural-based pulmonary nodules in the right lung in addition to mediastinal lymphadenopathy in August 2021.  The patient was initially diagnosed with a pleural-based nodule status post SBRT in March 2021. He completed systemic chemotherapy with carboplatin for AUC of 5 from day 1 and to etoposide 100 mg/M2 on days 1, 2 and 3 with Neulasta support every 3 weeks.  Status post 4 cycles. He tolerated his previous treatment fairly well with no concerning complaints except for fatigue. The patient underwent prophylactic cranial irradiation under the care of Dr. Sondra Come. He had repeat CT scan of the chest performed  recently.  I personally and independently reviewed the scans and discussed the results with the patient and his wife today. His scan showed enlargement of the right upper lobe pulmonary nodule suspicious for disease progression.  There was also a 0.3 cm anterior right lower lobe pulmonary nodule that need close attention on the upcoming scan. I discussed with the patient few options for management of his condition including SBRT to the enlarging right upper lobe pulmonary nodule versus continuous monitoring and observation.  He would like to proceed with the radiation treatment.  I will refer him back to Dr. Sondra Come for discussion of this option. I will see him back for  follow-up visit in 3 months for evaluation with repeat CT scan of the chest for restaging of his disease. He was advised to call immediately if he has any other concerning symptoms in the interval. The patient voices understanding of current disease status and treatment options and is in agreement with the current care plan.  All questions were answered. The patient knows to call the clinic with any problems, questions or concerns. We can certainly see the patient much sooner if necessary.  Disclaimer: This note was dictated with voice recognition software. Similar sounding words can inadvertently be transcribed and may not be corrected upon review.

## 2020-04-09 NOTE — Progress Notes (Incomplete)
Thoracic Location of Tumor / Histology: Rt Lung   Biopsies 09/12/2019  FINAL MICROSCOPIC DIAGNOSIS:   A. PLEURA, RIGHT MASS, NEEDLE CORE BIOPSY:  - Small cell carcinoma  - See comment   Tobacco/Marijuana/Snuff/ETOH use  Past/Anticipated interventions by cardiothoracic surgery, if any: 09/12/2019 Damita Dunnings DO  CT guided biopsy of right chest wall FDG avid mass  Past/Anticipated interventions by medical oncology, if any:   The patient was initially diagnosed with a pleural-based nodule status post SBRT in March 2021. He completed systemic chemotherapy with carboplatin for AUC of 5 from day 1 and to etoposide 100 mg/M2 on days 1, 2 and 3 with Neulasta support every 3 weeks SBRT to the enlarging right upper lobe pulmonary nodule versus continuous monitoring and observation.  He would like to proceed with the radiation treatment.   Signs/Symptoms  Weight changes, if any:   Respiratory complaints, if any:   Hemoptysis, if any:   Pain issues, if any:    SAFETY ISSUES:  Prior radiation? The patient underwent prophylactic cranial irradiation   Pacemaker/ICD?   Possible current pregnancy?  Is the patient on methotrexate?   Current Complaints / other details:

## 2020-04-15 ENCOUNTER — Ambulatory Visit: Payer: Medicare Other

## 2020-04-15 ENCOUNTER — Telehealth: Payer: Self-pay | Admitting: *Deleted

## 2020-04-15 ENCOUNTER — Ambulatory Visit
Admission: RE | Admit: 2020-04-15 | Discharge: 2020-04-15 | Disposition: A | Discharge status: Home / Self Care | Payer: Medicare Other | Source: Ambulatory Visit | Attending: Radiation Oncology | Admitting: Radiation Oncology

## 2020-04-15 DIAGNOSIS — C3431 Malignant neoplasm of lower lobe, right bronchus or lung: Secondary | ICD-10-CM

## 2020-04-15 NOTE — Progress Notes (Incomplete)
Radiation Oncology         (336) [phone] ________________________________  Name: Jonathon [name] MRN: [id number]  Date: 04/15/2020  DOB: 01-19-1950  Re-Evaluation Note  CC: [name], MD  [name], MD    ICD-10-CM   1. 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 He was last seen in follow-up on 03/30/2020 following prophylactic cranial radiation. At that time, he was recovering from the effects of radiation and was to continue close follow-up with Dr. Julien Nordmann  Since his last visit, he underwent a chest CT scan on 04/06/2020 that showed interval increase in size of the 9 mm right upper lobe pulmonary nodule with more convex margins. Imaging features raised concern for neoplasm. There was also noted to be a new 3 mm anterior right lower lobe pulmonary nodule. The 2.3 cm medial left upper lobe nodular opacity was stable.  The patient was last seen by Dr. Julien Nordmann on 04/08/2020, at which time they discussed treatment with SBRT to the enlarging right upper lobe pulmonary nodule vs continuous monitoring and observation. The patient requested proceeding with radiation treatment.  On review of systems, the patient reports ***. He denies *** and any other symptoms.    Allergies:  is allergic to atorvastatin.  Meds: Current Outpatient Medications  Medication Sig Dispense Refill  . albuterol (PROVENTIL) (2.5 MG/3ML) 0.083% nebulizer solution Take 2.5 mg by nebulization every 4 (four) hours as needed for wheezing or shortness of breath.    . Albuterol Sulfate (PROAIR RESPICLICK) 409 (90 BASE) MCG/ACT AEPB Inhale 2 puffs into the lungs 4 (four) times daily as needed. 1 each 5  . amLODipine (NORVASC) 10 MG tablet Take 10 mg by mouth every  morning.     Marland Kitchen atorvastatin (LIPITOR) 20 MG tablet Take 20 mg by mouth daily.    . bisoprolol (ZEBETA) 5 MG tablet Take 1 tablet (5 mg total) by mouth daily. 30 tablet 0  . budesonide-formoterol (SYMBICORT) 160-4.5 MCG/ACT inhaler Inhale 2 puffs into the lungs 2 (two) times daily.    . cloNIDine (CATAPRES) 0.3 MG tablet Take 0.3 mg by mouth 2 (two) times daily.    . Cyanocobalamin (VITAMIN B 12 PO) Take 1 tablet by mouth every morning.     . cyclobenzaprine (FLEXERIL) 5 MG tablet Take 5 mg by mouth at bedtime.    . dabigatran (PRADAXA) 150 MG CAPS capsule Take 150 mg by mouth 2 (two) times daily.    . DULoxetine (CYMBALTA) 30 MG capsule Take 30 mg by mouth every morning.     . gabapentin (NEURONTIN) 400 MG capsule Take 400 mg by mouth 3 (three) times daily.     . hydrochlorothiazide (MICROZIDE) 12.5 MG capsule Take 12.5 mg by mouth daily.    Marland Kitchen losartan (COZAAR) 100 MG tablet Take 1 tablet (100 mg total) by mouth daily. 90 tablet 3  . metFORMIN (GLUCOPHAGE) 500 MG tablet Take 500 mg by mouth daily with breakfast.    . mupirocin cream (BACTROBAN) 2 % Apply 1 application topically 2 (two) times daily. 15 g 0  . nitroGLYCERIN (NITROSTAT) 0.4 MG SL tablet Place 0.4 mg under the tongue every 5 (five) minutes as needed for chest pain.    . nortriptyline (PAMELOR) 25 MG capsule Take 25 mg  by mouth at bedtime.    . pantoprazole (PROTONIX) 20 MG tablet TAKE 1 TABLET BY MOUTH DAILY 90 tablet 2  . prochlorperazine (COMPAZINE) 10 MG tablet Take 1 tablet (10 mg total) by mouth every 6 (six) hours as needed for nausea or vomiting. 30 tablet 0  . tamsulosin (FLOMAX) 0.4 MG CAPS capsule Take 0.4 mg by mouth daily.    . temazepam (RESTORIL) 15 MG capsule Take by mouth. (Patient not taking: Reported on 04/08/2020)    . Tiotropium Bromide Monohydrate (SPIRIVA RESPIMAT) 2.5 MCG/ACT AERS Inhale 2 puffs into the lungs daily. 4 g 5   No current facility-administered medications for this encounter.    Physical  Findings: The patient is in no acute distress. Patient is alert and oriented.  vitals were not taken for this visit.  No significant changes. Lungs are clear to auscultation bilaterally. Heart has regular rate and rhythm. No palpable cervical, supraclavicular, or axillary adenopathy. Abdomen soft, non-tender, normal bowel sounds. ***  Lab Findings: Lab Results  Component Value Date   WBC 8.6 04/06/2020   HGB 13.7 04/06/2020   HCT 42.1 04/06/2020   MCV 85.2 04/06/2020   PLT 315 04/06/2020    Radiographic Findings: CT Chest W Contrast  Result Date: 04/06/2020 CLINICAL DATA:  Small-cell lung cancer.  Restaging. EXAM: CT CHEST WITH CONTRAST TECHNIQUE: Multidetector CT imaging of the chest was performed during intravenous contrast administration. CONTRAST:  72mL OMNIPAQUE IOHEXOL 300 MG/ML  SOLN COMPARISON:  01/01/2020 FINDINGS: Cardiovascular: The heart size is normal. No substantial pericardial effusion. Coronary artery calcification is evident. Atherosclerotic calcification is noted in the wall of the thoracic aorta. Mediastinum/Nodes: No mediastinal lymphadenopathy. There is no hilar lymphadenopathy. The esophagus has normal imaging features. There is no axillary lymphadenopathy. Lungs/Pleura: Extensive changes of emphysema with bullous disease in the apices bilaterally. 2.3 cm nodular opacity in the medial left upper lobe is stable, previously characterized as scarring (105/5). 7 mm right lower lobe subpleural nodule described previously is almost imperceptible today (image 108/5). 9 mm right upper lobe nodule (40/5) was measured previously at 5 mm. Qualitatively this nodule has more convex margins today than before. 3 mm anterior right lower lobe nodule on 84/5 is new in the interval. Upper Abdomen: 13 mm calcified gallstone evident. 3 cm well-defined homogeneous low-density lesion upper pole left kidney is compatible with a cyst. Musculoskeletal: No worrisome lytic or sclerotic osseous  abnormality. IMPRESSION: 1. Interval increase in size of the 9 mm right upper lobe pulmonary nodule with more convex margins today. Imaging features raise concern for neoplasm. Close follow-up recommended with repeat CT chest in 3 months. 2. New 3 mm anterior right lower lobe pulmonary nodule. Attention on follow-up recommended. 3. Stable 2.3 cm medial left upper lobe nodular opacity, previously characterized as scarring. 4. Cholelithiasis. 5. Aortic Atherosclerosis (ICD10-I70.0) and Emphysema (ICD10-J43.9). Electronically Signed   By: Misty Stanley [name]   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 Julien Nordmann, physical examination, and documentation.  -----------------------------------  Blair Promise, PhD, MD  This document serves as a record of services personally performed by Gery Pray, MD. It was created on his behalf by Clerance Lav, a trained medical scribe. The creation of this record is  based on the scribe's personal observations and the provider's statements to them. This document has been checked and approved by the attending provider.

## 2020-04-15 NOTE — Telephone Encounter (Signed)
Called patient to inform that he doesn't need to come in today for RCON appt., spoke with patient and he is aware that he doesn't need to come today

## 2020-04-19 ENCOUNTER — Other Ambulatory Visit (HOSPITAL_COMMUNITY): Payer: Self-pay

## 2020-04-19 MED FILL — Cyclobenzaprine HCl Tab 5 MG: ORAL | 30 days supply | Qty: 30 | Fill #0 | Status: AC

## 2020-04-20 ENCOUNTER — Other Ambulatory Visit (HOSPITAL_COMMUNITY): Payer: Self-pay

## 2020-04-20 MED FILL — Gabapentin Cap 400 MG: ORAL | 30 days supply | Qty: 180 | Fill #0 | Status: AC

## 2020-04-29 ENCOUNTER — Telehealth: Payer: Self-pay

## 2020-04-29 NOTE — Telephone Encounter (Signed)
Patient called requesting a call back  Regarding treatment.

## 2020-05-06 ENCOUNTER — Ambulatory Visit
Admission: RE | Admit: 2020-05-06 | Discharge: 2020-05-06 | Disposition: A | Discharge status: Home / Self Care | Payer: Medicare Other | Source: Ambulatory Visit | Attending: Radiation Oncology | Admitting: Radiation Oncology

## 2020-05-06 ENCOUNTER — Other Ambulatory Visit: Payer: Self-pay

## 2020-05-06 ENCOUNTER — Ambulatory Visit: Payer: Medicare Other | Admitting: Radiation Oncology

## 2020-05-06 ENCOUNTER — Ambulatory Visit: Payer: Medicare Other

## 2020-05-06 DIAGNOSIS — C3491 Malignant neoplasm of unspecified part of right bronchus or lung: Secondary | ICD-10-CM | POA: Diagnosis present

## 2020-05-07 ENCOUNTER — Ambulatory Visit: Payer: Medicare Other | Admitting: Radiation Oncology

## 2020-05-11 ENCOUNTER — Other Ambulatory Visit (HOSPITAL_COMMUNITY): Payer: Self-pay

## 2020-05-11 MED FILL — Duloxetine HCl Enteric Coated Pellets Cap 30 MG (Base Eq): ORAL | 90 days supply | Qty: 90 | Fill #0 | Status: AC

## 2020-05-11 MED FILL — Hydrochlorothiazide Cap 12.5 MG: ORAL | 90 days supply | Qty: 90 | Fill #0 | Status: AC

## 2020-05-11 MED FILL — Tamsulosin HCl Cap 0.4 MG: ORAL | 90 days supply | Qty: 90 | Fill #0 | Status: AC

## 2020-05-11 MED FILL — Budesonide-Formoterol Fumarate Dihyd Aerosol 160-4.5 MCG/ACT: RESPIRATORY_TRACT | 30 days supply | Qty: 10.2 | Fill #0 | Status: AC

## 2020-05-12 DIAGNOSIS — C3491 Malignant neoplasm of unspecified part of right bronchus or lung: Secondary | ICD-10-CM | POA: Diagnosis not present

## 2020-05-13 ENCOUNTER — Ambulatory Visit: Payer: Medicare Other | Admitting: Radiation Oncology

## 2020-05-14 ENCOUNTER — Other Ambulatory Visit (HOSPITAL_COMMUNITY): Payer: Self-pay

## 2020-05-14 ENCOUNTER — Ambulatory Visit: Payer: Medicare Other | Admitting: Radiation Oncology

## 2020-05-15 ENCOUNTER — Ambulatory Visit: Payer: Medicare Other | Admitting: Radiation Oncology

## 2020-05-18 ENCOUNTER — Ambulatory Visit
Admission: RE | Admit: 2020-05-18 | Discharge: 2020-05-18 | Disposition: A | Discharge status: Home / Self Care | Payer: Medicare Other | Source: Ambulatory Visit | Attending: Radiation Oncology | Admitting: Radiation Oncology

## 2020-05-18 ENCOUNTER — Other Ambulatory Visit: Payer: Self-pay

## 2020-05-18 DIAGNOSIS — R911 Solitary pulmonary nodule: Secondary | ICD-10-CM | POA: Insufficient documentation

## 2020-05-18 DIAGNOSIS — C3491 Malignant neoplasm of unspecified part of right bronchus or lung: Secondary | ICD-10-CM | POA: Insufficient documentation

## 2020-05-19 ENCOUNTER — Other Ambulatory Visit (HOSPITAL_COMMUNITY): Payer: Self-pay

## 2020-05-19 MED ORDER — METFORMIN HCL 500 MG PO TABS
1.0000 | ORAL_TABLET | Freq: Every day | ORAL | 3 refills | Status: AC
  Filled 2020-05-19 – 2020-08-28 (×2): qty 90, 90d supply, fill #0
  Filled 2020-11-25: qty 90, 90d supply, fill #1
  Filled 2021-02-22: qty 90, 90d supply, fill #2

## 2020-05-19 MED ORDER — BISOPROLOL FUMARATE 5 MG PO TABS
ORAL_TABLET | Freq: Every day | ORAL | 3 refills | Status: DC
  Filled 2020-05-19: qty 90, 90d supply, fill #0
  Filled 2020-08-11: qty 90, 90d supply, fill #1
  Filled 2020-11-10: qty 90, 90d supply, fill #2

## 2020-05-19 MED ORDER — CLONIDINE HCL 0.3 MG PO TABS
0.3000 mg | ORAL_TABLET | Freq: Two times a day (BID) | ORAL | 3 refills | Status: AC
  Filled 2020-05-19: qty 180, 90d supply, fill #0
  Filled 2020-08-19: qty 180, 90d supply, fill #1
  Filled 2020-11-25: qty 180, 90d supply, fill #2
  Filled 2021-02-23: qty 180, 90d supply, fill #0

## 2020-05-19 MED ORDER — AMLODIPINE BESYLATE 10 MG PO TABS
1.0000 | ORAL_TABLET | Freq: Every day | ORAL | 3 refills | Status: DC
  Filled 2020-05-19: qty 90, 90d supply, fill #0
  Filled 2020-08-11: qty 90, 90d supply, fill #1
  Filled 2020-11-10: qty 90, 90d supply, fill #2

## 2020-05-19 MED FILL — Cyclobenzaprine HCl Tab 5 MG: ORAL | 30 days supply | Qty: 30 | Fill #1 | Status: AC

## 2020-05-20 ENCOUNTER — Other Ambulatory Visit (HOSPITAL_COMMUNITY): Payer: Self-pay

## 2020-05-20 ENCOUNTER — Ambulatory Visit
Admission: RE | Admit: 2020-05-20 | Discharge: 2020-05-20 | Disposition: A | Discharge status: Home / Self Care | Payer: Medicare Other | Source: Ambulatory Visit | Attending: Radiation Oncology | Admitting: Radiation Oncology

## 2020-05-20 ENCOUNTER — Other Ambulatory Visit: Payer: Self-pay

## 2020-05-20 DIAGNOSIS — C3491 Malignant neoplasm of unspecified part of right bronchus or lung: Secondary | ICD-10-CM

## 2020-05-20 MED FILL — Gabapentin Cap 400 MG: ORAL | 30 days supply | Qty: 180 | Fill #1 | Status: AC

## 2020-05-25 ENCOUNTER — Encounter: Payer: Self-pay | Admitting: Radiation Oncology

## 2020-05-25 ENCOUNTER — Ambulatory Visit
Admission: RE | Admit: 2020-05-25 | Discharge: 2020-05-25 | Disposition: A | Discharge status: Home / Self Care | Payer: Medicare Other | Source: Ambulatory Visit | Attending: Radiation Oncology | Admitting: Radiation Oncology

## 2020-05-25 ENCOUNTER — Other Ambulatory Visit: Payer: Self-pay

## 2020-05-25 DIAGNOSIS — Z923 Personal history of irradiation: Secondary | ICD-10-CM

## 2020-05-25 DIAGNOSIS — C3491 Malignant neoplasm of unspecified part of right bronchus or lung: Secondary | ICD-10-CM | POA: Diagnosis not present

## 2020-05-25 DIAGNOSIS — R911 Solitary pulmonary nodule: Secondary | ICD-10-CM

## 2020-05-25 HISTORY — DX: Personal history of irradiation: Z92.3

## 2020-06-01 NOTE — Progress Notes (Signed)
Patient refused to be seen by anyone but Dr. [name], he therefore left and rescheduled appointment.

## 2020-06-02 ENCOUNTER — Encounter: Payer: Self-pay | Admitting: Student

## 2020-06-02 ENCOUNTER — Ambulatory Visit: Payer: Medicare Other | Admitting: Student

## 2020-06-02 ENCOUNTER — Other Ambulatory Visit: Payer: Self-pay

## 2020-06-02 DIAGNOSIS — I10 Essential (primary) hypertension: Secondary | ICD-10-CM

## 2020-06-02 DIAGNOSIS — I2510 Atherosclerotic heart disease of native coronary artery without angina pectoris: Secondary | ICD-10-CM

## 2020-06-02 DIAGNOSIS — E7800 Pure hypercholesterolemia, unspecified: Secondary | ICD-10-CM

## 2020-06-04 ENCOUNTER — Other Ambulatory Visit (HOSPITAL_COMMUNITY): Payer: Self-pay

## 2020-06-04 MED ORDER — NORTRIPTYLINE HCL 25 MG PO CAPS
25.0000 mg | ORAL_CAPSULE | Freq: Every day | ORAL | 0 refills | Status: DC
  Filled 2020-06-04: qty 90, 90d supply, fill #0

## 2020-06-18 ENCOUNTER — Other Ambulatory Visit (HOSPITAL_COMMUNITY): Payer: Self-pay

## 2020-06-18 MED FILL — Cyclobenzaprine HCl Tab 5 MG: ORAL | 30 days supply | Qty: 30 | Fill #2 | Status: AC

## 2020-06-18 MED FILL — Budesonide-Formoterol Fumarate Dihyd Aerosol 160-4.5 MCG/ACT: RESPIRATORY_TRACT | 30 days supply | Qty: 10.2 | Fill #1 | Status: AC

## 2020-06-24 ENCOUNTER — Other Ambulatory Visit (HOSPITAL_COMMUNITY): Payer: Self-pay

## 2020-06-24 MED ORDER — SPIRIVA RESPIMAT 2.5 MCG/ACT IN AERS
INHALATION_SPRAY | RESPIRATORY_TRACT | 11 refills | Status: DC
  Filled 2020-06-24: qty 4, 30d supply, fill #0
  Filled 2020-07-13: qty 4, 30d supply, fill #1
  Filled 2020-08-11: qty 4, 30d supply, fill #2
  Filled 2020-09-10: qty 4, 30d supply, fill #3
  Filled 2020-10-12: qty 4, 30d supply, fill #4

## 2020-06-25 ENCOUNTER — Other Ambulatory Visit (HOSPITAL_COMMUNITY): Payer: Self-pay

## 2020-06-25 ENCOUNTER — Other Ambulatory Visit: Payer: Self-pay | Admitting: Cardiology

## 2020-06-25 MED ORDER — PANTOPRAZOLE SODIUM 20 MG PO TBEC
DELAYED_RELEASE_TABLET | Freq: Every day | ORAL | 2 refills | Status: DC
  Filled 2020-06-25: qty 90, 90d supply, fill #0
  Filled 2020-09-23: qty 90, 90d supply, fill #1
  Filled 2020-12-14: qty 90, 90d supply, fill #2

## 2020-06-25 MED FILL — Gabapentin Cap 400 MG: ORAL | 30 days supply | Qty: 180 | Fill #2 | Status: AC

## 2020-06-26 ENCOUNTER — Encounter: Payer: Self-pay | Admitting: Radiology

## 2020-06-29 ENCOUNTER — Other Ambulatory Visit: Payer: Self-pay

## 2020-06-29 ENCOUNTER — Ambulatory Visit (HOSPITAL_COMMUNITY)
Admission: RE | Admit: 2020-06-29 | Discharge: 2020-06-29 | Disposition: A | Discharge status: Home / Self Care | Payer: Medicare Other | Source: Ambulatory Visit | Attending: Internal Medicine | Admitting: Internal Medicine

## 2020-06-29 ENCOUNTER — Ambulatory Visit (HOSPITAL_COMMUNITY)
Admission: RE | Admit: 2020-06-29 | Discharge: 2020-06-29 | Disposition: A | Discharge status: Home / Self Care | Payer: Medicare Other | Source: Ambulatory Visit | Attending: Radiation Oncology | Admitting: Radiation Oncology

## 2020-06-29 DIAGNOSIS — C3490 Malignant neoplasm of unspecified part of unspecified bronchus or lung: Secondary | ICD-10-CM | POA: Insufficient documentation

## 2020-06-29 LAB — POCT I-STAT CREATININE: Creatinine, Ser: 0.9 mg/dL (ref 0.61–1.24)

## 2020-06-29 MED ORDER — GADOBUTROL 1 MMOL/ML IV SOLN
10.0000 mL | Freq: Once | INTRAVENOUS | Status: AC | PRN
  Administered 2020-06-29: 10 mL via INTRAVENOUS

## 2020-06-29 MED ORDER — SODIUM CHLORIDE (PF) 0.9 % IJ SOLN
INTRAMUSCULAR | Status: AC
  Filled 2020-06-29: qty 50

## 2020-06-29 MED ORDER — IOHEXOL 300 MG/ML  SOLN
75.0000 mL | Freq: Once | INTRAMUSCULAR | Status: AC | PRN
  Administered 2020-06-29: 75 mL via INTRAVENOUS

## 2020-07-01 NOTE — Progress Notes (Incomplete)
Patient Name: Jonathon [name] MRN: [id number] DOB: 1950/02/19 Referring Physician: [name] (Profile Not Attached) Date of Service: 05/25/2020 [facility], [place]                                                        End Of Treatment Note  Diagnoses: C34.31-Malignant neoplasm of lower lobe, right bronchus or lung C34.91-Malignant neoplasm of unspecified part of right bronchus or lung R91.1-Solitary pulmonary nodule Z29.8-Encounter for other specified prophylactic measures  Cancer Staging: limited stage (T3, N2, M0) small cell lung cancer  Intent: Curative  Radiation Treatment Dates: 05/18/2020 through 05/25/2020 Site Technique Total Dose (Gy) Dose per Fx (Gy) Completed Fx Beam Energies  Lung, Right: Lung_Rt IMRT 54/54 18 3/3 6XFFF   Narrative: The patient tolerated radiation therapy relatively well. The patient denies having any pain although he does report having a productive cough as well as shortness of breath with exertion. He additionally reports feeling like there is a lump in his chest at times.  He denies having any fever, chills, esophageal symptoms, or having green sputum.  Of note: throughout the course of his SBRT treatment, he reported significant fatigue.  Plan: The patient will undergo a chest CT scan in mid June.  I will see the patient a couple days after the scan to review.  He will also see Dr. Julien Nordmann in June.  ________________________________________________ -----------------------------------  Blair Promise, PhD, MD  This document serves as a record of services personally performed by Gery Pray, MD. It was created on his behalf by Roney Mans, a trained medical scribe. The creation of this record is based on the scribe's personal observations and the provider's statements to them. This document has been checked and approved by the attending provider.

## 2020-07-01 NOTE — Progress Notes (Signed)
Radiation Oncology         [phone]) [phone] ________________________________  Name: Jonathon [name] MRN: [id number]  Date: 07/02/2020  DOB: 1950-12-07  Follow-Up Visit Note  CC: Jonathon Limbo, MD  Jonathon Bears, MD  Diagnosis:   limited stage (T3, N2, M0) small cell lung cancer presented with pulmonary nodules and pleural-based nodules in the right lung in addition to mediastinal lymphadenopathy diagnosed in August 2021  Interval Since Last Radiation:  1 month and 1 week  Intent: Curative  Radiation Treatment Dates: 05/18/2020 through 05/25/2020 Site Technique Total Dose (Gy) Dose per Fx (Gy) Completed Fx Beam Energies  Lung, Right: Lung_Rt IMRT 54/54 18 3/3 6XFFF   Narrative:  The patient returns today for 3 month follow-up; he was last seen on 03/30/20. Since then, the patient followed up with Dr. Julien [name] on 04/08/20 to discuss recent CT taken on 04/06/20 which showed further improvement of his disease at the time.   The patient completed his most recent radiation treatment on 05/25/20 which he tolerated relatively well. He did report having fatigue throughout the course of his treatment as well as a productive cough and shortness of breath near the end of his treatment.   On 06/29/20, the patient received an MRI of the brain which showed no focus of abnormal contrast enhancement to suggest intracranial metastatic disease. (Remote small infarcts in the left frontal lobe, bilateral periventricular white matter, thalami, and left basal ganglia were noted.) The moderate chronic white matter disease noted from prior MRI showed no significant change.    The patient additionally received a chest CT on 06/29/20 which revealed new/enlarging bilateral pulmonary nodules and enlargement of right hilar lymph node; consistent with metastatic disease.                 Allergies:  is allergic to atorvastatin.  Meds: Current Outpatient Medications  Medication Sig Dispense Refill   albuterol  (PROVENTIL) (2.5 MG/3ML) 0.083% nebulizer solution Take 2.5 mg by nebulization every 4 (four) hours as needed for wheezing or shortness of breath.     Albuterol Sulfate (PROAIR RESPICLICK) 277 (90 BASE) MCG/ACT AEPB Inhale 2 puffs into the lungs 4 (four) times daily as needed. 1 each 5   amLODipine (NORVASC) 10 MG tablet TAKE 1 TABLET BY MOUTH ONCE A DAY 90 tablet 3   atorvastatin (LIPITOR) 20 MG tablet Take 20 mg by mouth daily.     bisoprolol (ZEBETA) 5 MG tablet TAKE ONE (1) TABLET BY MOUTH EVERY DAY 90 tablet 3   cloNIDine (CATAPRES) 0.3 MG tablet TAKE 1 TABLET BY MOUTH TWICE DAILY 180 tablet 3   Cyanocobalamin (VITAMIN B 12 PO) Take 1 tablet by mouth every morning.      cyclobenzaprine (FLEXERIL) 5 MG tablet TAKE 1 TABLET BY MOUTH AT BEDTIME AS NEEDED FOR MUSCLE SPASMS 30 tablet 3   dabigatran (PRADAXA) 150 MG CAPS capsule Take 150 mg by mouth 2 (two) times daily.     DULoxetine (CYMBALTA) 30 MG capsule TAKE ONE CAPSULE BY MOUTH DAILY 90 capsule 3   gabapentin (NEURONTIN) 400 MG capsule TAKE 2 CAPSULES BY MOUTH THREE TIMES A DAY 180 capsule 7   hydrochlorothiazide (MICROZIDE) 12.5 MG capsule TAKE 1 CAPSULE (12.5 MG TOTAL) BY MOUTH DAILY. 90 capsule 2   losartan (COZAAR) 100 MG tablet TAKE 1 TABLET (100 MG TOTAL) BY MOUTH DAILY. 90 tablet 3   metFORMIN (GLUCOPHAGE) 500 MG tablet TAKE 1 TABLET BY MOUTH ONCE A DAY WITH BREAKFAST 90 tablet 3  mupirocin cream (BACTROBAN) 2 % APPLY TOPICALLY 2 (TWO) TIMES DAILY. 15 g 0   nitroGLYCERIN (NITROSTAT) 0.4 MG SL tablet PLACE ONE TABLET (0.4 MG DOSE) UNDER THE TONGUE EVERY 5 (FIVE) MINUTES AS NEEDED. 25 tablet 5   nortriptyline (PAMELOR) 25 MG capsule TAKE 1 CAPSULE BY MOUTH EVERY NIGHT AT BEDTIME 90 capsule 0   pantoprazole (PROTONIX) 20 MG tablet TAKE 1 TABLET BY MOUTH DAILY 90 tablet 2   prochlorperazine (COMPAZINE) 10 MG tablet Take 1 tablet (10 mg total) by mouth every 6 (six) hours as needed for nausea or vomiting. 30 tablet 0   SYMBICORT 160-4.5  MCG/ACT inhaler INHALE 2 PUFFS INTO THE LUNGS TWICE DAILY 10.2 g 11   tamsulosin (FLOMAX) 0.4 MG CAPS capsule TAKE 1 CAPSULE BY MOUTH DAILY 90 capsule 3   temazepam (RESTORIL) 15 MG capsule Take by mouth.     Tiotropium Bromide Monohydrate (SPIRIVA RESPIMAT) 2.5 MCG/ACT AERS Inhale two puffs into the lungs as needed. 4 g 11   No current facility-administered medications for this encounter.    Physical Findings: The patient is in no acute distress. Patient is alert and oriented.  weight is 204 lb 6.4 oz (92.7 kg). His temperature is 97.2 F (36.2 C) (abnormal). His blood pressure is 149/75 (abnormal) and his pulse is 62. His respiration is 18 and oxygen saturation is 95%. .  No significant changes. Lungs are clear to auscultation bilaterally. Heart has regular rate and rhythm. No palpable cervical, supraclavicular, or axillary adenopathy. Abdomen soft, non-tender, normal bowel sounds.   Lab Findings: Lab Results  Component Value Date   WBC 8.6 04/06/2020   HGB 13.7 04/06/2020   HCT 42.1 04/06/2020   MCV 85.2 04/06/2020   PLT 315 04/06/2020    Radiographic Findings: CT Chest W Contrast  Addendum Date: 06/29/2020   ADDENDUM REPORT: 06/29/2020 16:28 ADDENDUM: The following is added to the body of the report: Probable 2.3 cm sebaceous cyst overlying the medial right scapula, unchanged. Electronically Signed   By: Lorin Picket [name]   On: 06/29/2020 16:28   Result Date: 06/29/2020 CLINICAL DATA:  Small cell lung cancer staging. EXAM: CT CHEST WITH CONTRAST TECHNIQUE: Multidetector CT imaging of the chest was performed during intravenous contrast administration. CONTRAST:  57mL OMNIPAQUE IOHEXOL 300 MG/ML  SOLN COMPARISON:  04/06/2020. FINDINGS: Cardiovascular: Atherosclerotic calcification of the aorta and coronary arteries. Heart size normal. No pericardial effusion. Mediastinum/Nodes: Mediastinal lymph nodes measure up to 11 mm in the high right paratracheal station, unchanged. Right hilar  lymph node measures 1.9 cm, enlarged from 11 mm. No left hilar or axillary adenopathy. Esophagus is grossly unremarkable. Lungs/Pleura: Severe bullous emphysema. Right upper lobectomy. 6 mm right lower lobe nodule (7/49), slightly decreased from 9 mm on 04/06/2020. 7 mm (5 x 8 mm, 7/107) posterior right lower lobe nodule is new. Similarly, a 6 mm nodule in the inferior right middle lobe (7/95) is new. 1.7 x 2.6 cm lingular nodule (7/103) has enlarged slightly from 1.5 x 2.3 cm on 04/06/2020. 5 mm left lower lobe nodule (7/107), new. No pleural fluid. Airway is unremarkable. Scattered pulmonary parenchymal scarring. Upper Abdomen: Visualized portions of the liver and adrenal glands are unremarkable. Cortical scarring in the right kidney. Low-attenuation lesion in the left kidney measures 3.1 cm but is incompletely imaged. Visualized portions of the spleen and pancreas are unremarkable. Moderate hiatal hernia. Calcified upper abdominal lymph nodes. Musculoskeletal: Degenerative changes in the spine. No worrisome lytic or sclerotic lesions. IMPRESSION: 1. New/enlarging bilateral pulmonary  nodules and enlarging right hilar lymph node, consistent with metastatic disease. 2. Aortic atherosclerosis (ICD10-I70.0). Coronary artery calcification. 3.  Emphysema (ICD10-J43.9). Electronically Signed: By: Lorin Picket [name] On: 06/29/2020 16:00   MR Brain W Wo Contrast  Result Date: 06/29/2020 CLINICAL DATA:  Small-cell lung cancer, staging. EXAM: MRI HEAD WITHOUT AND WITH CONTRAST TECHNIQUE: Multiplanar, multiecho pulse sequences of the brain and surrounding structures were obtained without and with intravenous contrast. CONTRAST:  54mL GADAVIST GADOBUTROL 1 MMOL/ML IV SOLN COMPARISON:  MRI of the brain January 30, 2020. FINDINGS: Brain: No acute infarction, hemorrhage, hydrocephalus, extra-axial collection or mass lesion. Susceptibility artifact on the left side of the pons related to prior hemorrhagic infarct. Area of  encephalomalacia and gliosis in the left frontal lobe. Remote lacunar infarcts within the right periatrial white matter, left corona radiata, thalami and left basal ganglia. Scattered and confluent foci of T2 hyperintensity are seen within the white matter of the cerebral hemispheres and within the pons, nonspecific, most likely related to chronic small vessel ischemia. No focus of abnormal contrast enhancement. Vascular: Normal flow voids. Skull and upper cervical spine: Normal marrow signal. Sinuses/Orbits: Mucosal thickening of the right maxillary sinus. The orbits are maintained. Other: Mild bilateral mastoid effusion. IMPRESSION: 1. No focus of abnormal contrast enhancement to suggest intracranial metastatic disease. 2. Remote small infarcts in the left frontal lobe, bilateral periventricular white matter, thalami and left basal ganglia. 3. Moderate chronic white matter disease, not significantly changed from prior. Electronically Signed   By: Pedro Earls [name]   On: 06/29/2020 12:25    Impression:  limited stage (T3, N2, M0) small cell lung cancer presented with pulmonary nodules and pleural-based nodules in the right lung in addition to mediastinal lymphadenopathy diagnosed in August 2021  Recent MRI shows no development of metastatic disease to the brain.  I discussed these findings with the patient and he is pleased with this result.  Patient recently completed SBRT for a solitary lesion in the right lung area.  On recent CT scan this area has decreased significantly in size.  Unfortunately on imaging as above patient has developed further small areas within the lung as well as right hilar adenopathy.  I discussed these findings in detail with patient and reviewed the images with the patient and his wife today.  I do not see any role for additional radiation therapy at this time.  He may be a potential candidate for systemic therapy and will see Dr. Julien [name] in the near future to  discuss this issue.  Plan: Patient would like to continue close imaging of his brain and we will schedule him for an MRI of the brain in approximately 3 months.   ____________________________________  Blair Promise, PhD, MD   This document serves as a record of services personally performed by Gery Pray, MD. It was created on his behalf by Roney Mans, a trained medical scribe. The creation of this record is based on the scribe's personal observations and the provider's statements to them. This document has been checked and approved by the attending provider.

## 2020-07-02 ENCOUNTER — Other Ambulatory Visit: Payer: Self-pay

## 2020-07-02 ENCOUNTER — Ambulatory Visit
Admission: RE | Admit: 2020-07-02 | Discharge: 2020-07-02 | Disposition: A | Discharge status: Home / Self Care | Payer: Medicare Other | Source: Ambulatory Visit | Attending: Radiation Oncology | Admitting: Radiation Oncology

## 2020-07-02 ENCOUNTER — Encounter: Payer: Self-pay | Admitting: Radiation Oncology

## 2020-07-02 VITALS — BP 149/75 | HR 62 | Temp 97.2°F | Resp 18 | Wt 204.4 lb

## 2020-07-02 DIAGNOSIS — C3491 Malignant neoplasm of unspecified part of right bronchus or lung: Secondary | ICD-10-CM

## 2020-07-02 NOTE — Progress Notes (Signed)
Jonathon [name] is here today for follow up post radiation to the lung.  Lung Side: right  Does the patient complain of any of the following: Pain:denies Shortness of breath w/wo exertion: shortness of breath with exertion Cough: productive cough on occasion Hemoptysis: denies Pain with swallowing: denies Swallowing/choking concerns: denies Appetite: fair Energy Level: poor Post radiation skin Changes: denies    Additional comments if applicable: none   Vitals:   07/02/20 1128  BP: (!) 149/75  Pulse: 62  Resp: 18  Temp: (!) 97.2 F (36.2 C)  SpO2: 95%  Weight: 204 lb 6.4 oz (92.7 kg)

## 2020-07-07 ENCOUNTER — Other Ambulatory Visit (HOSPITAL_COMMUNITY): Payer: Self-pay

## 2020-07-07 ENCOUNTER — Inpatient Hospital Stay: Payer: Medicare Other | Attending: Internal Medicine

## 2020-07-07 ENCOUNTER — Other Ambulatory Visit: Payer: Self-pay

## 2020-07-07 DIAGNOSIS — I110 Hypertensive heart disease with heart failure: Secondary | ICD-10-CM | POA: Diagnosis not present

## 2020-07-07 DIAGNOSIS — Z7901 Long term (current) use of anticoagulants: Secondary | ICD-10-CM | POA: Diagnosis not present

## 2020-07-07 DIAGNOSIS — Z87891 Personal history of nicotine dependence: Secondary | ICD-10-CM | POA: Insufficient documentation

## 2020-07-07 DIAGNOSIS — Z7984 Long term (current) use of oral hypoglycemic drugs: Secondary | ICD-10-CM | POA: Diagnosis not present

## 2020-07-07 DIAGNOSIS — E119 Type 2 diabetes mellitus without complications: Secondary | ICD-10-CM | POA: Diagnosis not present

## 2020-07-07 DIAGNOSIS — Z7951 Long term (current) use of inhaled steroids: Secondary | ICD-10-CM | POA: Diagnosis not present

## 2020-07-07 DIAGNOSIS — I252 Old myocardial infarction: Secondary | ICD-10-CM | POA: Diagnosis not present

## 2020-07-07 DIAGNOSIS — Z8673 Personal history of transient ischemic attack (TIA), and cerebral infarction without residual deficits: Secondary | ICD-10-CM | POA: Insufficient documentation

## 2020-07-07 DIAGNOSIS — I509 Heart failure, unspecified: Secondary | ICD-10-CM | POA: Diagnosis not present

## 2020-07-07 DIAGNOSIS — Z79899 Other long term (current) drug therapy: Secondary | ICD-10-CM | POA: Insufficient documentation

## 2020-07-07 DIAGNOSIS — E7800 Pure hypercholesterolemia, unspecified: Secondary | ICD-10-CM | POA: Diagnosis not present

## 2020-07-07 DIAGNOSIS — Z923 Personal history of irradiation: Secondary | ICD-10-CM | POA: Diagnosis not present

## 2020-07-07 DIAGNOSIS — C3411 Malignant neoplasm of upper lobe, right bronchus or lung: Secondary | ICD-10-CM | POA: Diagnosis present

## 2020-07-07 DIAGNOSIS — Z9221 Personal history of antineoplastic chemotherapy: Secondary | ICD-10-CM | POA: Diagnosis not present

## 2020-07-07 DIAGNOSIS — C3490 Malignant neoplasm of unspecified part of unspecified bronchus or lung: Secondary | ICD-10-CM

## 2020-07-07 LAB — CBC WITH DIFFERENTIAL (CANCER CENTER ONLY)
Abs Immature Granulocytes: 0.02 10*3/uL (ref 0.00–0.07)
Basophils Absolute: 0 10*3/uL (ref 0.0–0.1)
Basophils Relative: 1 %
Eosinophils Absolute: 0.2 10*3/uL (ref 0.0–0.5)
Eosinophils Relative: 2 %
HCT: 39.8 % (ref 39.0–52.0)
Hemoglobin: 13.4 g/dL (ref 13.0–17.0)
Immature Granulocytes: 0 %
Lymphocytes Relative: 22 %
Lymphs Abs: 1.7 10*3/uL (ref 0.7–4.0)
MCH: 28.8 pg (ref 26.0–34.0)
MCHC: 33.7 g/dL (ref 30.0–36.0)
MCV: 85.4 fL (ref 80.0–100.0)
Monocytes Absolute: 0.9 10*3/uL (ref 0.1–1.0)
Monocytes Relative: 11 %
Neutro Abs: 5.2 10*3/uL (ref 1.7–7.7)
Neutrophils Relative %: 64 %
Platelet Count: 245 10*3/uL (ref 150–400)
RBC: 4.66 MIL/uL (ref 4.22–5.81)
RDW: 14.8 % (ref 11.5–15.5)
WBC Count: 8.1 10*3/uL (ref 4.0–10.5)
nRBC: 0 % (ref 0.0–0.2)

## 2020-07-07 LAB — CMP (CANCER CENTER ONLY)
ALT: 12 U/L (ref 0–44)
AST: 13 U/L — ABNORMAL LOW (ref 15–41)
Albumin: 3.8 g/dL (ref 3.5–5.0)
Alkaline Phosphatase: 73 U/L (ref 38–126)
Anion gap: 7 (ref 5–15)
BUN: 14 mg/dL (ref 8–23)
CO2: 28 mmol/L (ref 22–32)
Calcium: 9 mg/dL (ref 8.9–10.3)
Chloride: 100 mmol/L (ref 98–111)
Creatinine: 0.98 mg/dL (ref 0.61–1.24)
GFR, Estimated: >60 mL/min (ref >60)
Glucose, Bld: 132 mg/dL — ABNORMAL HIGH (ref 70–99)
Potassium: 3.1 mmol/L — ABNORMAL LOW (ref 3.5–5.1)
Sodium: 135 mmol/L (ref 135–145)
Total Bilirubin: 0.5 mg/dL (ref 0.3–1.2)
Total Protein: 7.6 g/dL (ref 6.5–8.1)

## 2020-07-07 LAB — COLOGUARD: COLOGUARD: NEGATIVE

## 2020-07-07 MED FILL — Budesonide-Formoterol Fumarate Dihyd Aerosol 160-4.5 MCG/ACT: RESPIRATORY_TRACT | 30 days supply | Qty: 10.2 | Fill #2 | Status: CN

## 2020-07-08 ENCOUNTER — Telehealth: Payer: Self-pay

## 2020-07-08 NOTE — Telephone Encounter (Signed)
Contacted pt per Dr Worthy Flank request to let pt know: Potassium is low.  Please make sure that he increase the potassium in his diet or take potassium supplement.  Thank you.        Pt not taking oral K. Discussed types of foods high in potassium. Pt verbalized understanding and stated he would increase potassium in his diet.

## 2020-07-09 ENCOUNTER — Other Ambulatory Visit (HOSPITAL_COMMUNITY): Payer: Self-pay

## 2020-07-09 ENCOUNTER — Other Ambulatory Visit: Payer: Self-pay

## 2020-07-09 ENCOUNTER — Inpatient Hospital Stay: Payer: Medicare Other

## 2020-07-09 ENCOUNTER — Inpatient Hospital Stay (HOSPITAL_BASED_OUTPATIENT_CLINIC_OR_DEPARTMENT_OTHER): Payer: Medicare Other | Admitting: Internal Medicine

## 2020-07-09 VITALS — BP 143/90 | HR 73 | Temp 97.8°F | Resp 19 | Ht 72.0 in | Wt 201.1 lb

## 2020-07-09 DIAGNOSIS — Z5111 Encounter for antineoplastic chemotherapy: Secondary | ICD-10-CM

## 2020-07-09 DIAGNOSIS — I10 Essential (primary) hypertension: Secondary | ICD-10-CM | POA: Diagnosis not present

## 2020-07-09 DIAGNOSIS — C3491 Malignant neoplasm of unspecified part of right bronchus or lung: Secondary | ICD-10-CM | POA: Diagnosis not present

## 2020-07-09 DIAGNOSIS — C3411 Malignant neoplasm of upper lobe, right bronchus or lung: Secondary | ICD-10-CM | POA: Diagnosis not present

## 2020-07-09 DIAGNOSIS — Z5112 Encounter for antineoplastic immunotherapy: Secondary | ICD-10-CM

## 2020-07-09 MED FILL — Budesonide-Formoterol Fumarate Dihyd Aerosol 160-4.5 MCG/ACT: RESPIRATORY_TRACT | 30 days supply | Qty: 10.2 | Fill #2 | Status: AC

## 2020-07-09 MED FILL — Losartan Potassium Tab 100 MG: ORAL | 90 days supply | Qty: 90 | Fill #0 | Status: AC

## 2020-07-09 NOTE — Progress Notes (Signed)
DISCONTINUE ON PATHWAY REGIMEN - Small Cell Lung     A cycle is every 21 days:     Carboplatin      Etoposide   **Always confirm dose/schedule in your pharmacy ordering system**  REASON: Disease Progression PRIOR TREATMENT: [id number]: Carboplatin AUC=5 D1 + Etoposide 100 mg/m2 IV D1-3 q21 Days x 4 Cycles with Concurrent Radiation TREATMENT RESPONSE: Partial Response (PR)  START ON PATHWAY REGIMEN - Small Cell Lung     Cycles 1 through 4: A cycle is every 21 days:     Durvalumab      Carboplatin      Etoposide    Cycles 5 and beyond: A cycle is every 28 days:     Durvalumab   **Always confirm dose/schedule in your pharmacy ordering system**  Patient Characteristics: Relapsed or Progressive Disease, Second Line, Relapse > 6 Months Therapeutic Status: Relapsed or Progressive Disease Line of Therapy: Second Line Time to Relapse: Relapse > 6 Months Intent of Therapy: Non-Curative / Palliative Intent, Discussed with Patient

## 2020-07-09 NOTE — Progress Notes (Signed)
[place] [phone]   [phone]) [phone]  OFFICE PROGRESS NOTE  [name], MD [address] [place] [address]  DIAGNOSIS: Relapsed small cell lung cancer initially diagnosed as limited stage (T3, N2, M0) small cell lung cancer presented with pulmonary nodules and pleural-based nodules in the right lung in addition to mediastinal lymphadenopathy diagnosed in August 2021.  The patient was previously treated without biopsy to a pleural-based nodule in the right lower lobe with SBRT in March 2021 but had evidence for disease recurrence and progression.  He has disease relapse in June 2022.  PRIOR THERAPY:  1) SBRT to right lower lobe pulmonary nodule in March 2021 under the care of Dr. Sondra Come. 2) Systemic chemotherapy with carboplatin for AUC of 5 on day 1 and etoposide 100 mg/M2 on days 1, 2 and 3 with Neulasta support every 3 weeks.  First cycle 10/07/2019.  Status post 4 cycles.  Last dose of the chemotherapy was given on December 09, 2019.  CURRENT THERAPY: Second line systemic chemotherapy with carboplatin for AUC of 5 on day 1, etoposide 100 Mg/M2 on days 1, 2 and 3 with Cosela before the chemotherapy in addition to Imfinzi 1500 Mg IV on day 1 every 3 weeks.  First dose July 27, 2020.  INTERVAL HISTORY: Jonathon [name] 70 y.o. male returns to the clinic today for follow-up visit accompanied by his wife.  The patient is feeling fine today with no concerning complaints except for the baseline shortness of breath secondary to significant COPD.  He denied having any chest pain, cough or hemoptysis.  He denied having any nausea, vomiting, diarrhea or constipation.  He has no headache or visual changes.  He denied having any significant weight loss or night sweats.  He underwent palliative radiotherapy to the enlarging pulmonary nodule under the care of Dr. Sondra Come.  He had repeat CT scan of the chest performed recently and he is here for  evaluation and discussion of his scan results.   MEDICAL HISTORY: Past Medical History:  Diagnosis Date   Cancer ([place])    CHF (congestive heart failure) (HCC)    Chronic cough    COPD, severe (HCC)    PT DENIES REGULAR DAILY INHALER ANY PRN   Coronary artery disease    CVA (cerebral vascular accident) ([place]) 02/22/2011   LEFT BRAIN STEM PONTINE HEMORRHAGE--  RIGHT SIDED WEAKNESS   Diabetes mellitus without complication (HCC)    type 2   Dyspnea    GERD (gastroesophageal reflux disease)    Harsh voice quality    PT STATES NORMAL FOR HIM   High cholesterol    History of CHF (congestive heart failure) MONITORED BY DR Coletta Memos   DIASTOLIC   History of radiation therapy 03/13/19-03/20/19   SBRT Right Lung; Dr. Gery Pray   History of radiation therapy 02/17/20-02/28/20   Whole brain radiation; Dr. Gery Pray   History of radiation therapy 05/25/2020   05/18/2020-05/25/2020  SBRT Right Lung; Dr. Gery Pray   Hydrocele, left    Hypertension    NSTEMI (non-ST elevated myocardial infarction) ([place]) 12/2015   Pre-operative clearance    GIVEN BY DR Coletta Memos   Short of breath on exertion    Smoker    Weakness of right side of body    S/P CVA   FEB 2013    ALLERGIES:  is allergic to atorvastatin.  MEDICATIONS:  Current Outpatient Medications  Medication Sig Dispense Refill  albuterol (PROVENTIL) (2.5 MG/3ML) 0.083% nebulizer solution Take 2.5 mg by nebulization every 4 (four) hours as needed for wheezing or shortness of breath.     Albuterol Sulfate (PROAIR RESPICLICK) 834 (90 BASE) MCG/ACT AEPB Inhale 2 puffs into the lungs 4 (four) times daily as needed. 1 each 5   amLODipine (NORVASC) 10 MG tablet TAKE 1 TABLET BY MOUTH ONCE A DAY 90 tablet 3   atorvastatin (LIPITOR) 20 MG tablet Take 20 mg by mouth daily.     bisoprolol (ZEBETA) 5 MG tablet TAKE ONE (1) TABLET BY MOUTH EVERY DAY 90 tablet 3   cloNIDine (CATAPRES) 0.3 MG tablet TAKE 1 TABLET BY MOUTH TWICE DAILY 180 tablet 3    Cyanocobalamin (VITAMIN B 12 PO) Take 1 tablet by mouth every morning.      cyclobenzaprine (FLEXERIL) 5 MG tablet TAKE 1 TABLET BY MOUTH AT BEDTIME AS NEEDED FOR MUSCLE SPASMS 30 tablet 3   dabigatran (PRADAXA) 150 MG CAPS capsule Take 150 mg by mouth 2 (two) times daily.     DULoxetine (CYMBALTA) 30 MG capsule TAKE ONE CAPSULE BY MOUTH DAILY 90 capsule 3   gabapentin (NEURONTIN) 400 MG capsule TAKE 2 CAPSULES BY MOUTH THREE TIMES A DAY 180 capsule 7   hydrochlorothiazide (MICROZIDE) 12.5 MG capsule TAKE 1 CAPSULE (12.5 MG TOTAL) BY MOUTH DAILY. 90 capsule 2   losartan (COZAAR) 100 MG tablet TAKE 1 TABLET (100 MG TOTAL) BY MOUTH DAILY. 90 tablet 3   metFORMIN (GLUCOPHAGE) 500 MG tablet TAKE 1 TABLET BY MOUTH ONCE A DAY WITH BREAKFAST 90 tablet 3   mupirocin cream (BACTROBAN) 2 % APPLY TOPICALLY 2 (TWO) TIMES DAILY. 15 g 0   nitroGLYCERIN (NITROSTAT) 0.4 MG SL tablet PLACE ONE TABLET (0.4 MG DOSE) UNDER THE TONGUE EVERY 5 (FIVE) MINUTES AS NEEDED. 25 tablet 5   nortriptyline (PAMELOR) 25 MG capsule TAKE 1 CAPSULE BY MOUTH EVERY NIGHT AT BEDTIME 90 capsule 0   pantoprazole (PROTONIX) 20 MG tablet TAKE 1 TABLET BY MOUTH DAILY 90 tablet 2   prochlorperazine (COMPAZINE) 10 MG tablet Take 1 tablet (10 mg total) by mouth every 6 (six) hours as needed for nausea or vomiting. 30 tablet 0   SYMBICORT 160-4.5 MCG/ACT inhaler INHALE 2 PUFFS INTO THE LUNGS TWICE DAILY 10.2 g 11   tamsulosin (FLOMAX) 0.4 MG CAPS capsule TAKE 1 CAPSULE BY MOUTH DAILY 90 capsule 3   temazepam (RESTORIL) 15 MG capsule Take by mouth.     Tiotropium Bromide Monohydrate (SPIRIVA RESPIMAT) 2.5 MCG/ACT AERS Inhale two puffs into the lungs as needed. 4 g 11   No current facility-administered medications for this visit.    SURGICAL HISTORY:  Past Surgical History:  Procedure Laterality Date   ABDOMINAL ANGIOGRAM  09/04/2012   Procedure: ABDOMINAL ANGIOGRAM;  Surgeon: Laverda Page, MD;  Location: [facility] CATH LAB;  Service:  Cardiovascular;;   CARDIAC CATHETERIZATION  05-02-2011  DR Johnsie Cancel   MILD NONOBSTRUCTIVE CAD/ LAD, OM , AND D1/ EF 60^   CARDIAC CATHETERIZATION  AUG 2000   NON-OBSTRUTIVE CAD/ EF 68%/ 25% PROXIMAL LAD/ 20% MID CIRCUMFLEX   CARDIAC CATHETERIZATION [name] 12/23/2015   Procedure: Left Heart Cath and Coronary Angiography;  Surgeon: Adrian Prows, MD;  Location: [facility] CV LAB;  Service: Cardiovascular;  Laterality: [name];   CARDIAC CATHETERIZATION [name] 12/24/2015   Procedure: Coronary Stent Intervention;  Surgeon: Adrian Prows, MD;  Location: [facility] LAB;  Service: Cardiovascular;  Laterality: [name];   CORONARY ANGIOPLASTY  CORONARY STENT PLACEMENT  12/24/2015    PTCA and stenting of the distal RCA    HYDROCELE EXCISION Left 04/24/2012   Procedure: HYDROCELECTOMY ADULT;  Surgeon: Fredricka Bonine, MD;  Location: [facility];  Service: Urology;  Laterality: Left;  90 mins req for this case      [place]   LEFT AND RIGHT HEART CATHETERIZATION WITH CORONARY ANGIOGRAM [name] 09/04/2012   Procedure: LEFT AND RIGHT HEART CATHETERIZATION WITH CORONARY ANGIOGRAM;  Surgeon: Laverda Page, MD;  Location: [facility] CATH LAB;  Service: Cardiovascular;  Laterality: [name];   LEFT HEART CATHETERIZATION WITH CORONARY ANGIOGRAM [name] 05/02/2011   Procedure: LEFT HEART CATHETERIZATION WITH CORONARY ANGIOGRAM;  Surgeon: Josue Hector, MD;  Location: [facility] CATH LAB;  Service: Cardiovascular;  Laterality: [name];   THORACOTOMY/LOBECTOMY Right 03-06-2008   AND STAPLING OF BLEBS FOR SPONTANOUS PNEUMOTHORAX   TRANSTHORACIC ECHOCARDIOGRAM  02-17-2011   MODERATE LVH/ LVSF NORMAL/ EF 40-08%/ GRADE I DIASTOLIC DYSFUNCTION    REVIEW OF SYSTEMS:  Constitutional: positive for fatigue Eyes: negative Ears, nose, mouth, throat, and face: negative Respiratory: positive for cough and dyspnea on exertion Cardiovascular: negative Gastrointestinal: negative Genitourinary:negative Integument/breast:  negative Hematologic/lymphatic: negative Musculoskeletal:negative Neurological: negative Behavioral/Psych: negative Endocrine: negative Allergic/Immunologic: negative   PHYSICAL EXAMINATION: General appearance: alert, cooperative, fatigued, and no distress Head: Normocephalic, without obvious abnormality, atraumatic Neck: no adenopathy, no JVD, supple, symmetrical, trachea midline, and thyroid not enlarged, symmetric, no tenderness/mass/nodules Lymph nodes: Cervical, supraclavicular, and axillary nodes normal. Resp: wheezes bilaterally Back: symmetric, no curvature. ROM normal. No CVA tenderness. Cardio: regular rate and rhythm, S1, S2 normal, no murmur, click, rub or gallop GI: soft, non-tender; bowel sounds normal; no masses,  no organomegaly Extremities: extremities normal, atraumatic, no cyanosis or edema Neurologic: Alert and oriented X 3, normal strength and tone. Normal symmetric reflexes. Normal coordination and gait  ECOG PERFORMANCE STATUS: 1 - Symptomatic but completely ambulatory  Blood pressure (!) 143/90, pulse 73, temperature 97.8 F (36.6 C), temperature source Tympanic, resp. rate 19, height 6' (1.829 m), weight 201 lb 1.6 oz (91.2 kg), SpO2 92 %.  LABORATORY DATA: Lab Results  Component Value Date   WBC 8.1 07/07/2020   HGB 13.4 07/07/2020   HCT 39.8 07/07/2020   MCV 85.4 07/07/2020   PLT 245 07/07/2020      Chemistry      Component Value Date/Time   NA 135 07/07/2020 1011   K 3.1 (L) 07/07/2020 1011   CL 100 07/07/2020 1011   CO2 28 07/07/2020 1011   BUN 14 07/07/2020 1011   CREATININE 0.98 07/07/2020 1011      Component Value Date/Time   CALCIUM 9.0 07/07/2020 1011   ALKPHOS 73 07/07/2020 1011   AST 13 (L) 07/07/2020 1011   ALT 12 07/07/2020 1011   BILITOT 0.5 07/07/2020 1011       RADIOGRAPHIC STUDIES: CT Chest W Contrast  Addendum Date: 06/29/2020   ADDENDUM REPORT: 06/29/2020 16:28 ADDENDUM: The following is added to the body of the  report: Probable 2.3 cm sebaceous cyst overlying the medial right scapula, unchanged. Electronically Signed   By: Lorin Picket [name]   On: 06/29/2020 16:28   Result Date: 06/29/2020 CLINICAL DATA:  Small cell lung cancer staging. EXAM: CT CHEST WITH CONTRAST TECHNIQUE: Multidetector CT imaging of the chest was performed during intravenous contrast administration. CONTRAST:  47mL OMNIPAQUE IOHEXOL 300 MG/ML  SOLN COMPARISON:  04/06/2020. FINDINGS: Cardiovascular: Atherosclerotic calcification of the aorta and coronary arteries. Heart  size normal. No pericardial effusion. Mediastinum/Nodes: Mediastinal lymph nodes measure up to 11 mm in the high right paratracheal station, unchanged. Right hilar lymph node measures 1.9 cm, enlarged from 11 mm. No left hilar or axillary adenopathy. Esophagus is grossly unremarkable. Lungs/Pleura: Severe bullous emphysema. Right upper lobectomy. 6 mm right lower lobe nodule (7/49), slightly decreased from 9 mm on 04/06/2020. 7 mm (5 x 8 mm, 7/107) posterior right lower lobe nodule is new. Similarly, a 6 mm nodule in the inferior right middle lobe (7/95) is new. 1.7 x 2.6 cm lingular nodule (7/103) has enlarged slightly from 1.5 x 2.3 cm on 04/06/2020. 5 mm left lower lobe nodule (7/107), new. No pleural fluid. Airway is unremarkable. Scattered pulmonary parenchymal scarring. Upper Abdomen: Visualized portions of the liver and adrenal glands are unremarkable. Cortical scarring in the right kidney. Low-attenuation lesion in the left kidney measures 3.1 cm but is incompletely imaged. Visualized portions of the spleen and pancreas are unremarkable. Moderate hiatal hernia. Calcified upper abdominal lymph nodes. Musculoskeletal: Degenerative changes in the spine. No worrisome lytic or sclerotic lesions. IMPRESSION: 1. New/enlarging bilateral pulmonary nodules and enlarging right hilar lymph node, consistent with metastatic disease. 2. Aortic atherosclerosis (ICD10-I70.0). Coronary artery  calcification. 3.  Emphysema (ICD10-J43.9). Electronically Signed: By: Lorin Picket [name] On: 06/29/2020 16:00   MR Brain W Wo Contrast  Result Date: 06/29/2020 CLINICAL DATA:  Small-cell lung cancer, staging. EXAM: MRI HEAD WITHOUT AND WITH CONTRAST TECHNIQUE: Multiplanar, multiecho pulse sequences of the brain and surrounding structures were obtained without and with intravenous contrast. CONTRAST:  54mL GADAVIST GADOBUTROL 1 MMOL/ML IV SOLN COMPARISON:  MRI of the brain January 30, 2020. FINDINGS: Brain: No acute infarction, hemorrhage, hydrocephalus, extra-axial collection or mass lesion. Susceptibility artifact on the left side of the pons related to prior hemorrhagic infarct. Area of encephalomalacia and gliosis in the left frontal lobe. Remote lacunar infarcts within the right periatrial white matter, left corona radiata, thalami and left basal ganglia. Scattered and confluent foci of T2 hyperintensity are seen within the white matter of the cerebral hemispheres and within the pons, nonspecific, most likely related to chronic small vessel ischemia. No focus of abnormal contrast enhancement. Vascular: Normal flow voids. Skull and upper cervical spine: Normal marrow signal. Sinuses/Orbits: Mucosal thickening of the right maxillary sinus. The orbits are maintained. Other: Mild bilateral mastoid effusion. IMPRESSION: 1. No focus of abnormal contrast enhancement to suggest intracranial metastatic disease. 2. Remote small infarcts in the left frontal lobe, bilateral periventricular white matter, thalami and left basal ganglia. 3. Moderate chronic white matter disease, not significantly changed from prior. Electronically Signed   By: Pedro Earls [name]   On: 06/29/2020 12:25     ASSESSMENT AND PLAN: This is a very pleasant 70 years old white male recently diagnosed with limited stage small cell lung cancer (T3, N2, M0) presented with pleural-based pulmonary nodules in the right lung in  addition to mediastinal lymphadenopathy in August 2021.  The patient was initially diagnosed with a pleural-based nodule status post SBRT in March 2021. He completed systemic chemotherapy with carboplatin for AUC of 5 from day 1 and to etoposide 100 mg/M2 on days 1, 2 and 3 with Neulasta support every 3 weeks.  Status post 4 cycles. The patient tolerated his treatment well except for fatigue. He also underwent palliative radiotherapy to enlarging right lung nodule under the care of Dr. Sondra Come. The patient is currently on observation.  He had repeat CT scan of the chest performed  recently. I personally and independently reviewed the scan images and discussed the result and showed the images to the patient and his wife today. His scan showed new and enlarging bilateral pulmonary nodules and the enlarging right hilar lymph nodes consistent with metastatic disease. I discussed with the patient his treatment options including palliative care versus palliative systemic chemotherapy with carboplatin for AUC of 5 on day 1, etoposide 100 Mg/M2 on days 1, 2 and 3 with Cosela before the chemotherapy as well as Imfinzi 1500 Mg IV on day 1 every 3 weeks with the chemotherapy followed by maintenance every 4 weeks. The patient is interested in proceeding with the systemic chemotherapy but he would like to start after coming back from his vacation in early July 2022. We will arrange for him to receive the first cycle of this treatment on July 27, 2020. I discussed with the patient the adverse effect of this treatment including but not limited to alopecia, myelosuppression, nausea and vomiting, peripheral neuropathy, liver or renal dysfunction as well as immunotherapy adverse effects. He will come back for follow-up visit at that time. He was advised to call immediately if he has any other concerning symptoms in the interval. The patient voices understanding of current disease status and treatment options and is in  agreement with the current care plan.  All questions were answered. The patient knows to call the clinic with any problems, questions or concerns. We can certainly see the patient much sooner if necessary.  Disclaimer: This note was dictated with voice recognition software. Similar sounding words can inadvertently be transcribed and may not be corrected upon review.

## 2020-07-13 ENCOUNTER — Other Ambulatory Visit (HOSPITAL_COMMUNITY): Payer: Self-pay

## 2020-07-13 ENCOUNTER — Inpatient Hospital Stay: Payer: Medicare Other | Admitting: Internal Medicine

## 2020-07-13 ENCOUNTER — Telehealth: Payer: Self-pay | Admitting: Internal Medicine

## 2020-07-13 MED FILL — Cyclobenzaprine HCl Tab 5 MG: ORAL | 30 days supply | Qty: 30 | Fill #3 | Status: AC

## 2020-07-13 NOTE — Telephone Encounter (Signed)
Cancelled today's appt with provider per provider request in secure chat. Called and left msg for patient.. will call again to try and confirm cancellation

## 2020-07-17 ENCOUNTER — Other Ambulatory Visit (HOSPITAL_COMMUNITY): Payer: Self-pay

## 2020-07-17 NOTE — Progress Notes (Signed)
Pharmacist Chemotherapy Monitoring - Initial Assessment    Anticipated start date: 07/27/20   The following has been reviewed per standard work regarding the patient's treatment regimen: The patient's diagnosis, treatment plan and drug doses, and organ/hematologic function Lab orders and baseline tests specific to treatment regimen  The treatment plan start date, drug sequencing, and pre-medications Prior authorization status  Patient's documented medication list, including drug-drug interaction screen and prescriptions for anti-emetics and supportive care specific to the treatment regimen The drug concentrations, fluid compatibility, administration routes, and timing of the medications to be used The patient's access for treatment and lifetime cumulative dose history, if applicable  The patient's medication allergies and previous infusion related reactions, if applicable   Changes made to treatment plan:  [name]  Follow up needed:  Pending authorization for treatment  Additional premeds add for cumulative carbo doses   Philomena Course, [place], 07/17/2020  8:33 AM

## 2020-07-22 ENCOUNTER — Ambulatory Visit: Payer: Medicare Other | Admitting: Cardiology

## 2020-07-24 NOTE — Progress Notes (Signed)
[facility] OFFICE PROGRESS NOTE  [name], MD [address] [place] [address]  DIAGNOSIS:  Relapsed small cell lung cancer initially diagnosed as limited stage (T3, N2, M0) small cell lung cancer presented with pulmonary nodules and pleural-based nodules in the right lung in addition to mediastinal lymphadenopathy diagnosed in August 2021.  The patient was previously treated without biopsy to a pleural-based nodule in the right lower lobe with SBRT in March 2021 but had evidence for disease recurrence and progression with new and enlarging bilateral pulmonary nodules in the enlarging right hilar lymph nodes in June 2022.  PRIOR THERAPY: 1) SBRT to right lower lobe pulmonary nodule in March 2021 under the care of Dr. Sondra Come. 2) Systemic chemotherapy with carboplatin for AUC of 5 on day 1 and etoposide 100 mg/M2 on days 1, 2 and 3 with Neulasta support every 3 weeks.  First cycle 10/07/2019.  Status post 4 cycles.  Last dose of the chemotherapy was given on December 09, 2019.  CURRENT THERAPY: Second line systemic chemotherapy with carboplatin for AUC of 5 on day 1, etoposide 100 Mg/M2 on days 1, 2 and 3 with Cosela before the chemotherapy in addition to Imfinzi 1500 Mg IV on day 1 every 3 weeks.  First dose July 27, 2020.   INTERVAL HISTORY: Jonathon [name] 70 y.o. male returns to the clinic today for a follow-up visit accompanied by his wife.  The patient recently was found to have evidence of disease recurrence/relapse with new enlarging bilateral pulmonary nodules and enlarging right hilar lymph nodes.  This was found in June 2022.  The patient is here today for evaluation before starting his first cycle of second line systemic chemotherapy.  Since his last appointment, the patient denies any new concerning complaints.  He denies any recent fever, chills, night sweats, or unexplained weight loss.  He reports his baseline dyspnea on exertion secondary  to significant COPD. He as 1-2 cigarettes in the morning. He reports dyspnea with exertion such as climbing the stairs. He denies any chest pain, significant cough, or hemoptysis.  He denies any nausea, vomiting, diarrhea, or constipation.  He denies any headache or visual changes.  He denies any headache or visual changes except he has some visual issues with his right eye and states he is overdue to see his eye doctor so they are planning on making an appointment soon.  The patient is here today for evaluation and repeat blood work before starting with cycle #1.     MEDICAL HISTORY: Past Medical History:  Diagnosis Date   Cancer ([facility])    CHF (congestive heart failure) (HCC)    Chronic cough    COPD, severe (HCC)    PT DENIES REGULAR DAILY INHALER ANY PRN   Coronary artery disease    CVA (cerebral vascular accident) ([facility]) 02/22/2011   LEFT BRAIN STEM PONTINE HEMORRHAGE--  RIGHT SIDED WEAKNESS   Diabetes mellitus without complication (HCC)    type 2   Dyspnea    GERD (gastroesophageal reflux disease)    Harsh voice quality    PT STATES NORMAL FOR HIM   High cholesterol    History of CHF (congestive heart failure) MONITORED BY DR Coletta Memos   DIASTOLIC   History of radiation therapy 03/13/19-03/20/19   SBRT Right Lung; Dr. Gery Pray   History of radiation therapy 02/17/20-02/28/20   Whole brain radiation; Dr. Gery Pray   History of radiation therapy 05/25/2020   05/18/2020-05/25/2020  SBRT  Right Lung; Dr. Gery Pray   Hydrocele, left    Hypertension    NSTEMI (non-ST elevated myocardial infarction) ([facility]) 12/2015   Pre-operative clearance    GIVEN BY DR Coletta Memos   Short of breath on exertion    Smoker    Weakness of right side of body    S/P CVA   FEB 2013    ALLERGIES:  is allergic to atorvastatin.  MEDICATIONS:  Current Outpatient Medications  Medication Sig Dispense Refill   albuterol (PROVENTIL) (2.5 MG/3ML) 0.083% nebulizer solution Take 2.5 mg by nebulization every  4 (four) hours as needed for wheezing or shortness of breath.     Albuterol Sulfate (PROAIR RESPICLICK) 144 (90 BASE) MCG/ACT AEPB Inhale 2 puffs into the lungs 4 (four) times daily as needed. 1 each 5   amLODipine (NORVASC) 10 MG tablet TAKE 1 TABLET BY MOUTH ONCE A DAY 90 tablet 3   atorvastatin (LIPITOR) 20 MG tablet Take 20 mg by mouth daily.     bisoprolol (ZEBETA) 5 MG tablet TAKE ONE (1) TABLET BY MOUTH EVERY DAY 90 tablet 3   cloNIDine (CATAPRES) 0.3 MG tablet TAKE 1 TABLET BY MOUTH TWICE DAILY 180 tablet 3   Cyanocobalamin (VITAMIN B 12 PO) Take 1 tablet by mouth every morning.      cyclobenzaprine (FLEXERIL) 5 MG tablet TAKE 1 TABLET BY MOUTH AT BEDTIME AS NEEDED FOR MUSCLE SPASMS 30 tablet 3   dabigatran (PRADAXA) 150 MG CAPS capsule Take 150 mg by mouth 2 (two) times daily.     DULoxetine (CYMBALTA) 30 MG capsule TAKE ONE CAPSULE BY MOUTH DAILY 90 capsule 3   gabapentin (NEURONTIN) 400 MG capsule TAKE 2 CAPSULES BY MOUTH THREE TIMES A DAY 180 capsule 7   hydrochlorothiazide (MICROZIDE) 12.5 MG capsule TAKE 1 CAPSULE (12.5 MG TOTAL) BY MOUTH DAILY. 90 capsule 2   losartan (COZAAR) 100 MG tablet TAKE 1 TABLET (100 MG TOTAL) BY MOUTH DAILY. 90 tablet 3   metFORMIN (GLUCOPHAGE) 500 MG tablet TAKE 1 TABLET BY MOUTH ONCE A DAY WITH BREAKFAST 90 tablet 3   mupirocin cream (BACTROBAN) 2 % APPLY TOPICALLY 2 (TWO) TIMES DAILY. 15 g 0   nitroGLYCERIN (NITROSTAT) 0.4 MG SL tablet PLACE ONE TABLET (0.4 MG DOSE) UNDER THE TONGUE EVERY 5 (FIVE) MINUTES AS NEEDED. 25 tablet 5   nortriptyline (PAMELOR) 25 MG capsule TAKE 1 CAPSULE BY MOUTH EVERY NIGHT AT BEDTIME 90 capsule 0   pantoprazole (PROTONIX) 20 MG tablet TAKE 1 TABLET BY MOUTH DAILY 90 tablet 2   prochlorperazine (COMPAZINE) 10 MG tablet Take 1 tablet (10 mg total) by mouth every 6 (six) hours as needed for nausea or vomiting. 30 tablet 0   SYMBICORT 160-4.5 MCG/ACT inhaler INHALE 2 PUFFS INTO THE LUNGS TWICE DAILY 10.2 g 11   tamsulosin  (FLOMAX) 0.4 MG CAPS capsule TAKE 1 CAPSULE BY MOUTH DAILY 90 capsule 3   temazepam (RESTORIL) 15 MG capsule Take by mouth.     Tiotropium Bromide Monohydrate (SPIRIVA RESPIMAT) 2.5 MCG/ACT AERS Inhale two puffs into the lungs as needed. 4 g 11   No current facility-administered medications for this visit.    SURGICAL HISTORY:  Past Surgical History:  Procedure Laterality Date   ABDOMINAL ANGIOGRAM  09/04/2012   Procedure: ABDOMINAL ANGIOGRAM;  Surgeon: Laverda Page, MD;  Location: [facility] CATH LAB;  Service: Cardiovascular;;   CARDIAC CATHETERIZATION  05-02-2011  DR Johnsie Cancel   MILD NONOBSTRUCTIVE CAD/ LAD, OM , AND D1/ EF 60^  CARDIAC CATHETERIZATION  AUG 2000   NON-OBSTRUTIVE CAD/ EF 68%/ 25% PROXIMAL LAD/ 20% MID CIRCUMFLEX   CARDIAC CATHETERIZATION [name] 12/23/2015   Procedure: Left Heart Cath and Coronary Angiography;  Surgeon: Adrian Prows, MD;  Location: [facility] CV LAB;  Service: Cardiovascular;  Laterality: [name];   CARDIAC CATHETERIZATION [name] 12/24/2015   Procedure: Coronary Stent Intervention;  Surgeon: Adrian Prows, MD;  Location: [facility] CV LAB;  Service: Cardiovascular;  Laterality: [name];   CORONARY ANGIOPLASTY     CORONARY STENT PLACEMENT  12/24/2015    PTCA and stenting of the distal RCA    HYDROCELE EXCISION Left 04/24/2012   Procedure: HYDROCELECTOMY ADULT;  Surgeon: Fredricka Bonine, MD;  Location: [facility];  Service: Urology;  Laterality: Left;  90 mins req for this case      [place]   LEFT AND RIGHT HEART CATHETERIZATION WITH CORONARY ANGIOGRAM [name] 09/04/2012   Procedure: LEFT AND RIGHT HEART CATHETERIZATION WITH CORONARY ANGIOGRAM;  Surgeon: Laverda Page, MD;  Location: [facility] CATH LAB;  Service: Cardiovascular;  Laterality: [name];   LEFT HEART CATHETERIZATION WITH CORONARY ANGIOGRAM [name] 05/02/2011   Procedure: LEFT HEART CATHETERIZATION WITH CORONARY ANGIOGRAM;  Surgeon: Josue Hector, MD;  Location: [facility] CATH LAB;  Service:  Cardiovascular;  Laterality: [name];   THORACOTOMY/LOBECTOMY Right 03-06-2008   AND STAPLING OF BLEBS FOR SPONTANOUS PNEUMOTHORAX   TRANSTHORACIC ECHOCARDIOGRAM  02-17-2011   MODERATE LVH/ LVSF NORMAL/ EF 29-56%/ GRADE I DIASTOLIC DYSFUNCTION    REVIEW OF SYSTEMS:   Review of Systems  Constitutional: Positive for fatigue. Negative for appetite change, chills, fever and unexpected weight change. HENT: Negative for mouth sores, nosebleeds, sore throat and trouble swallowing.   Eyes: Negative for eye problems and icterus. Respiratory: Positive for mild dyspnea on exertion. Negative for cough, hemoptysis, and wheezing.   Cardiovascular: Negative for chest pain and leg swelling. Gastrointestinal: Negative for abdominal pain, constipation, diarrhea, nausea and vomiting. Genitourinary: Negative for bladder incontinence, difficulty urinating, dysuria, frequency and hematuria.   Musculoskeletal: Negative for back pain, gait problem, neck pain and neck stiffness. Skin: Negative for itching and rash. Neurological: Negative for dizziness, extremity weakness, gait problem, headaches, light-headedness and seizures. Hematological: Negative for adenopathy. Does not bruise/bleed easily. Psychiatric/Behavioral: Negative for confusion, depression and sleep disturbance. The patient is not nervous/anxious.     PHYSICAL EXAMINATION:  Blood pressure 133/76, pulse 63, temperature 97.9 F (36.6 C), temperature source Oral, resp. rate 17, weight 199 lb 6.4 oz (90.4 kg), SpO2 94 %.  ECOG PERFORMANCE STATUS: 1  Physical Exam  Constitutional: Oriented to person, place, and time and well-developed, well-nourished, and in no distress. HENT: Head: Normocephalic and atraumatic. Mouth/Throat: Oropharynx is clear and moist. No oropharyngeal exudate. Eyes: Conjunctivae are normal. Right eye exhibits no discharge. Left eye exhibits no discharge. No scleral icterus. Neck: Normal range of motion. Neck  supple. Cardiovascular: Normal rate, regular rhythm, normal heart sounds and intact distal pulses.   Pulmonary/Chest: Effort normal. Quiet breath sounds bilaterally. No respiratory distress. No wheezes. No rales. Abdominal: Soft. Bowel sounds are normal. Exhibits no distension and no mass. There is no tenderness.  Musculoskeletal: Normal range of motion. Exhibits no edema.  Lymphadenopathy:    No cervical adenopathy.  Neurological: Alert and oriented to person, place, and time. Exhibits normal muscle tone. Ambulates with a cane.  Skin: Skin is warm and dry. Tenderness and localized lump on right wrist. Not diaphoretic. No pallor.  Psychiatric: Mood, memory and judgment normal. Vitals  reviewed.  LABORATORY DATA: Lab Results  Component Value Date   WBC 8.4 07/27/2020   HGB 13.4 07/27/2020   HCT 39.5 07/27/2020   MCV 83.7 07/27/2020   PLT 250 07/27/2020      Chemistry      Component Value Date/Time   NA 135 07/27/2020 0849   K 3.3 (L) 07/27/2020 0849   CL 99 07/27/2020 0849   CO2 27 07/27/2020 0849   BUN 12 07/27/2020 0849   CREATININE 1.12 07/27/2020 0849      Component Value Date/Time   CALCIUM 9.8 07/27/2020 0849   ALKPHOS 78 07/27/2020 0849   AST 9 (L) 07/27/2020 0849   ALT 8 07/27/2020 0849   BILITOT 0.4 07/27/2020 0849       RADIOGRAPHIC STUDIES:  CT Chest W Contrast  Addendum Date: 06/29/2020   ADDENDUM REPORT: 06/29/2020 16:28 ADDENDUM: The following is added to the body of the report: Probable 2.3 cm sebaceous cyst overlying the medial right scapula, unchanged. Electronically Signed   By: Lorin Picket [name]   On: 06/29/2020 16:28   Result Date: 06/29/2020 CLINICAL DATA:  Small cell lung cancer staging. EXAM: CT CHEST WITH CONTRAST TECHNIQUE: Multidetector CT imaging of the chest was performed during intravenous contrast administration. CONTRAST:  52mL OMNIPAQUE IOHEXOL 300 MG/ML  SOLN COMPARISON:  04/06/2020. FINDINGS: Cardiovascular: Atherosclerotic  calcification of the aorta and coronary arteries. Heart size normal. No pericardial effusion. Mediastinum/Nodes: Mediastinal lymph nodes measure up to 11 mm in the high right paratracheal station, unchanged. Right hilar lymph node measures 1.9 cm, enlarged from 11 mm. No left hilar or axillary adenopathy. Esophagus is grossly unremarkable. Lungs/Pleura: Severe bullous emphysema. Right upper lobectomy. 6 mm right lower lobe nodule (7/49), slightly decreased from 9 mm on 04/06/2020. 7 mm (5 x 8 mm, 7/107) posterior right lower lobe nodule is new. Similarly, a 6 mm nodule in the inferior right middle lobe (7/95) is new. 1.7 x 2.6 cm lingular nodule (7/103) has enlarged slightly from 1.5 x 2.3 cm on 04/06/2020. 5 mm left lower lobe nodule (7/107), new. No pleural fluid. Airway is unremarkable. Scattered pulmonary parenchymal scarring. Upper Abdomen: Visualized portions of the liver and adrenal glands are unremarkable. Cortical scarring in the right kidney. Low-attenuation lesion in the left kidney measures 3.1 cm but is incompletely imaged. Visualized portions of the spleen and pancreas are unremarkable. Moderate hiatal hernia. Calcified upper abdominal lymph nodes. Musculoskeletal: Degenerative changes in the spine. No worrisome lytic or sclerotic lesions. IMPRESSION: 1. New/enlarging bilateral pulmonary nodules and enlarging right hilar lymph node, consistent with metastatic disease. 2. Aortic atherosclerosis (ICD10-I70.0). Coronary artery calcification. 3.  Emphysema (ICD10-J43.9). Electronically Signed: By: Lorin Picket [name] On: 06/29/2020 16:00   MR Brain W Wo Contrast  Result Date: 06/29/2020 CLINICAL DATA:  Small-cell lung cancer, staging. EXAM: MRI HEAD WITHOUT AND WITH CONTRAST TECHNIQUE: Multiplanar, multiecho pulse sequences of the brain and surrounding structures were obtained without and with intravenous contrast. CONTRAST:  42mL GADAVIST GADOBUTROL 1 MMOL/ML IV SOLN COMPARISON:  MRI of the brain  January 30, 2020. FINDINGS: Brain: No acute infarction, hemorrhage, hydrocephalus, extra-axial collection or mass lesion. Susceptibility artifact on the left side of the pons related to prior hemorrhagic infarct. Area of encephalomalacia and gliosis in the left frontal lobe. Remote lacunar infarcts within the right periatrial white matter, left corona radiata, thalami and left basal ganglia. Scattered and confluent foci of T2 hyperintensity are seen within the white matter of the cerebral hemispheres and within the pons, nonspecific, most  likely related to chronic small vessel ischemia. No focus of abnormal contrast enhancement. Vascular: Normal flow voids. Skull and upper cervical spine: Normal marrow signal. Sinuses/Orbits: Mucosal thickening of the right maxillary sinus. The orbits are maintained. Other: Mild bilateral mastoid effusion. IMPRESSION: 1. No focus of abnormal contrast enhancement to suggest intracranial metastatic disease. 2. Remote small infarcts in the left frontal lobe, bilateral periventricular white matter, thalami and left basal ganglia. 3. Moderate chronic white matter disease, not significantly changed from prior. Electronically Signed   By: Pedro Earls [name]   On: 06/29/2020 12:25     ASSESSMENT/PLAN:  This is a very pleasant 70 year old male diagnosed initially with limited stage small cell lung cancer (T3, N2, M0) he presented with pleural-based pulmonary nodules in the right lung in addition to mediastinal lymphadenopathy in August 2021.  The patient was initially diagnosed with pleural-based nodule status post SBRT in March 2021.  He had evidence of disease recurrence and relapse in June 2022 with enlarging bilateral pulmonary nodules and enlarging right hilar lymph nodes consistent with metastatic disease.  He previously completed systemic chemotherapy with carboplatin for an AUC of 5 on day 1, etoposide 100 mg per metered squared on days 1, 2, and 3 with Neulasta  support every 3 weeks.  He is status post 4 cycles.    He was found to have evidence of disease recurrence in June 2022.  Therefore, the patient was started on second line palliative systemic chemotherapy with carboplatin for an AUC of 5 on day 1, etoposide 100 mg per metered squared on days 1, 2, and 3 with Cosela as well as Imfinzi 1500 mg IV on day 1 IV every 3 weeks.  The patient's first cycle of treatment is scheduled to start today.   Labs were reviewed.  Recommend that he proceed with cycle #1 today scheduled.  I discussed with her the adverse effect of the immunotherapy including but not limited to immunotherapy mediated skin rash, diarrhea, inflammation of the lung, kidney, liver, thyroid or other endocrine dysfunction We will see him back for follow-up visit in 3 weeks for evaluation before starting cycle #2.  The patient's potassium is a little low today. I gave the patient a handout of potassium rich food choices and he was encouraged to increase his intake of potassium.   The patient was advised to call immediately if he has any concerning symptoms in the interval. The patient voices understanding of current disease status and treatment options and is in agreement with the current care plan. All questions were answered. The patient knows to call the clinic with any problems, questions or concerns. We can certainly see the patient much sooner if necessary       No orders of the defined types were placed in this encounter.     The total time spent in the appointment was 20-29 minutes in this encounter.   Britne Borelli L Leslee Haueter, PA-C 07/27/20

## 2020-07-27 ENCOUNTER — Encounter: Payer: Self-pay | Admitting: Physician Assistant

## 2020-07-27 ENCOUNTER — Inpatient Hospital Stay: Payer: Medicare Other

## 2020-07-27 ENCOUNTER — Inpatient Hospital Stay: Payer: Medicare Other | Attending: Physician Assistant | Admitting: Physician Assistant

## 2020-07-27 ENCOUNTER — Other Ambulatory Visit: Payer: Self-pay

## 2020-07-27 VITALS — BP 133/76 | HR 63 | Temp 97.9°F | Resp 17 | Wt 199.4 lb

## 2020-07-27 DIAGNOSIS — I2510 Atherosclerotic heart disease of native coronary artery without angina pectoris: Secondary | ICD-10-CM | POA: Diagnosis not present

## 2020-07-27 DIAGNOSIS — I639 Cerebral infarction, unspecified: Secondary | ICD-10-CM | POA: Diagnosis not present

## 2020-07-27 DIAGNOSIS — I252 Old myocardial infarction: Secondary | ICD-10-CM | POA: Diagnosis not present

## 2020-07-27 DIAGNOSIS — C771 Secondary and unspecified malignant neoplasm of intrathoracic lymph nodes: Secondary | ICD-10-CM | POA: Diagnosis not present

## 2020-07-27 DIAGNOSIS — E119 Type 2 diabetes mellitus without complications: Secondary | ICD-10-CM | POA: Diagnosis not present

## 2020-07-27 DIAGNOSIS — Z5111 Encounter for antineoplastic chemotherapy: Secondary | ICD-10-CM | POA: Diagnosis not present

## 2020-07-27 DIAGNOSIS — I10 Essential (primary) hypertension: Secondary | ICD-10-CM | POA: Diagnosis not present

## 2020-07-27 DIAGNOSIS — C7800 Secondary malignant neoplasm of unspecified lung: Secondary | ICD-10-CM | POA: Diagnosis not present

## 2020-07-27 DIAGNOSIS — I509 Heart failure, unspecified: Secondary | ICD-10-CM | POA: Insufficient documentation

## 2020-07-27 DIAGNOSIS — C3491 Malignant neoplasm of unspecified part of right bronchus or lung: Secondary | ICD-10-CM | POA: Diagnosis not present

## 2020-07-27 DIAGNOSIS — E7800 Pure hypercholesterolemia, unspecified: Secondary | ICD-10-CM | POA: Diagnosis not present

## 2020-07-27 DIAGNOSIS — C3431 Malignant neoplasm of lower lobe, right bronchus or lung: Secondary | ICD-10-CM | POA: Insufficient documentation

## 2020-07-27 DIAGNOSIS — Z5112 Encounter for antineoplastic immunotherapy: Secondary | ICD-10-CM | POA: Diagnosis not present

## 2020-07-27 DIAGNOSIS — K219 Gastro-esophageal reflux disease without esophagitis: Secondary | ICD-10-CM | POA: Insufficient documentation

## 2020-07-27 DIAGNOSIS — J449 Chronic obstructive pulmonary disease, unspecified: Secondary | ICD-10-CM | POA: Diagnosis not present

## 2020-07-27 DIAGNOSIS — Z79899 Other long term (current) drug therapy: Secondary | ICD-10-CM | POA: Diagnosis not present

## 2020-07-27 LAB — CMP (CANCER CENTER ONLY)
ALT: 8 U/L (ref 0–44)
AST: 9 U/L — ABNORMAL LOW (ref 15–41)
Albumin: 3.3 g/dL — ABNORMAL LOW (ref 3.5–5.0)
Alkaline Phosphatase: 78 U/L (ref 38–126)
Anion gap: 9 (ref 5–15)
BUN: 12 mg/dL (ref 8–23)
CO2: 27 mmol/L (ref 22–32)
Calcium: 9.8 mg/dL (ref 8.9–10.3)
Chloride: 99 mmol/L (ref 98–111)
Creatinine: 1.12 mg/dL (ref 0.61–1.24)
GFR, Estimated: >60 mL/min (ref >60)
Glucose, Bld: 154 mg/dL — ABNORMAL HIGH (ref 70–99)
Potassium: 3.3 mmol/L — ABNORMAL LOW (ref 3.5–5.1)
Sodium: 135 mmol/L (ref 135–145)
Total Bilirubin: 0.4 mg/dL (ref 0.3–1.2)
Total Protein: 7.8 g/dL (ref 6.5–8.1)

## 2020-07-27 LAB — CBC WITH DIFFERENTIAL (CANCER CENTER ONLY)
Abs Immature Granulocytes: 0.02 10*3/uL (ref 0.00–0.07)
Basophils Absolute: 0.1 10*3/uL (ref 0.0–0.1)
Basophils Relative: 1 %
Eosinophils Absolute: 0.1 10*3/uL (ref 0.0–0.5)
Eosinophils Relative: 1 %
HCT: 39.5 % (ref 39.0–52.0)
Hemoglobin: 13.4 g/dL (ref 13.0–17.0)
Immature Granulocytes: 0 %
Lymphocytes Relative: 22 %
Lymphs Abs: 1.9 10*3/uL (ref 0.7–4.0)
MCH: 28.4 pg (ref 26.0–34.0)
MCHC: 33.9 g/dL (ref 30.0–36.0)
MCV: 83.7 fL (ref 80.0–100.0)
Monocytes Absolute: 0.8 10*3/uL (ref 0.1–1.0)
Monocytes Relative: 9 %
Neutro Abs: 5.6 10*3/uL (ref 1.7–7.7)
Neutrophils Relative %: 67 %
Platelet Count: 250 10*3/uL (ref 150–400)
RBC: 4.72 MIL/uL (ref 4.22–5.81)
RDW: 14.2 % (ref 11.5–15.5)
WBC Count: 8.4 10*3/uL (ref 4.0–10.5)
nRBC: 0 % (ref 0.0–0.2)

## 2020-07-27 LAB — TSH: TSH: 1.238 u[IU]/mL (ref 0.320–4.118)

## 2020-07-27 MED ORDER — PALONOSETRON HCL INJECTION 0.25 MG/5ML
INTRAVENOUS | Status: AC
  Filled 2020-07-27: qty 5

## 2020-07-27 MED ORDER — SODIUM CHLORIDE 0.9 % IV SOLN
1500.0000 mg | Freq: Once | INTRAVENOUS | Status: AC
  Administered 2020-07-27: 1500 mg via INTRAVENOUS
  Filled 2020-07-27: qty 30

## 2020-07-27 MED ORDER — PALONOSETRON HCL INJECTION 0.25 MG/5ML
0.2500 mg | Freq: Once | INTRAVENOUS | Status: AC
  Administered 2020-07-27: 0.25 mg via INTRAVENOUS

## 2020-07-27 MED ORDER — SODIUM CHLORIDE 0.9 % IV SOLN
Freq: Once | INTRAVENOUS | Status: AC
  Filled 2020-07-27: qty 250

## 2020-07-27 MED ORDER — SODIUM CHLORIDE 0.9 % IV SOLN
10.0000 mg | Freq: Once | INTRAVENOUS | Status: AC
  Administered 2020-07-27: 10 mg via INTRAVENOUS
  Filled 2020-07-27: qty 10

## 2020-07-27 MED ORDER — SODIUM CHLORIDE 0.9 % IV SOLN
100.0000 mg/m2 | Freq: Once | INTRAVENOUS | Status: AC
  Administered 2020-07-27: 220 mg via INTRAVENOUS
  Filled 2020-07-27: qty 11

## 2020-07-27 MED ORDER — SODIUM CHLORIDE 0.9 % IV SOLN
150.0000 mg | Freq: Once | INTRAVENOUS | Status: AC
  Administered 2020-07-27: 150 mg via INTRAVENOUS
  Filled 2020-07-27: qty 150

## 2020-07-27 MED ORDER — DEXTROSE 5 % IV SOLN
240.0000 mg/m2 | Freq: Once | INTRAVENOUS | Status: AC
Start: 2020-07-27 — End: 2020-07-27
  Administered 2020-07-27: 510 mg via INTRAVENOUS
  Filled 2020-07-27: qty 34

## 2020-07-27 MED ORDER — SODIUM CHLORIDE 0.9 % IV SOLN
520.0000 mg | Freq: Once | INTRAVENOUS | Status: AC
  Administered 2020-07-27: 520 mg via INTRAVENOUS
  Filled 2020-07-27: qty 52

## 2020-07-27 NOTE — Progress Notes (Signed)
Patient c/o of burning with Cosela administration. No redness, swelling, and good blood return present. Slowed the rate and placed heat pack on site. Patient continued to c/o pain with Cosela admin so a different IV was started. Continued Cosela admin at a slower rate with heat pack on site. [place] can act as an irritant and cause discomfort upon administration. Pharmacy will dilute future doses of Cosela to decrease patient discomfort. Discussed possibility of port placement with patient if pain continues during future doses.

## 2020-07-27 NOTE — Patient Instructions (Signed)
[facility]  Discharge Instructions: Thank you for choosing [facility] to provide your oncology and hematology care.   If you have a lab appointment with the [facility], please go directly to the [facility] and check in at the registration area.   Wear comfortable clothing and clothing appropriate for easy access to any Portacath or PICC line.   We strive to give you quality time with your provider. You may need to reschedule your appointment if you arrive late (15 or more minutes).  Arriving late affects you and other patients whose appointments are after yours.  Also, if you miss three or more appointments without notifying the office, you may be dismissed from the clinic at the provider's discretion.      For prescription refill requests, have your pharmacy contact our office and allow 72 hours for refills to be completed.    Today you received the following chemotherapy and/or immunotherapy agents: Iminzi, Carboplatin, Etoposide      To help prevent nausea and vomiting after your treatment, we encourage you to take your nausea medication as directed.  BELOW ARE SYMPTOMS THAT SHOULD BE REPORTED IMMEDIATELY: *FEVER GREATER THAN 100.4 F (38 C) OR HIGHER *CHILLS OR SWEATING *NAUSEA AND VOMITING THAT IS NOT CONTROLLED WITH YOUR NAUSEA MEDICATION *UNUSUAL SHORTNESS OF BREATH *UNUSUAL BRUISING OR BLEEDING *URINARY PROBLEMS (pain or burning when urinating, or frequent urination) *BOWEL PROBLEMS (unusual diarrhea, constipation, pain near the anus) TENDERNESS IN MOUTH AND THROAT WITH OR WITHOUT PRESENCE OF ULCERS (sore throat, sores in mouth, or a toothache) UNUSUAL RASH, SWELLING OR PAIN  UNUSUAL VAGINAL DISCHARGE OR ITCHING   Items with * indicate a potential emergency and should be followed up as soon as possible or go to the Emergency Department if any problems should occur.  Please show the CHEMOTHERAPY ALERT CARD or IMMUNOTHERAPY ALERT  CARD at check-in to the Emergency Department and triage nurse.  Should you have questions after your visit or need to cancel or reschedule your appointment, please contact [facility]  Dept: [phone]  and follow the prompts.  Office hours are 8:00 a.m. to 4:30 p.m. Monday - Friday. Please note that voicemails left after 4:00 p.m. may not be returned until the following business day.  We are closed weekends and major holidays. You have access to a nurse at all times for urgent questions. Please call the main number to the clinic Dept: [phone] and follow the prompts.   For any non-urgent questions, you may also contact your provider using MyChart. We now offer e-Visits for anyone 51 and older to request care online for non-urgent symptoms. For details visit [email or web address].   Also download the MyChart app! Go to the app store, search "MyChart", open the app, select , and log in with your MyChart username and password.  Due to Covid, a mask is required upon entering the hospital/clinic. If you do not have a mask, one will be given to you upon arrival. For doctor visits, patients may have 1 support person aged 22 or older with them. For treatment visits, patients cannot have anyone with them due to current Covid guidelines and our immunocompromised population.   Durvalumab injection What is this medication? DURVALUMAB (dur VAL ue mab) is a monoclonal antibody. It is used to treat lungcancer. This medicine may be used for other purposes; ask your health care provider orpharmacist if you have questions. COMMON BRAND NAME(S): IMFINZI What should I tell  my care team before I take this medication? They need to know if you have any of these conditions: autoimmune diseases like Crohn's disease, ulcerative colitis, or lupus have had or planning to have an allogeneic stem cell transplant (uses someone else's stem cells) history of organ  transplant history of radiation to the chest nervous system problems like myasthenia gravis or Guillain-Barre syndrome an unusual or allergic reaction to durvalumab, other medicines, foods, dyes, or preservatives pregnant or trying to get pregnant breast-feeding How should I use this medication? This medicine is for infusion into a vein. It is given by a health careprofessional in a hospital or clinic setting. A special MedGuide will be given to you before each treatment. Be sure to readthis information carefully each time. Talk to your pediatrician regarding the use of this medicine in children.Special care may be needed. Overdosage: If you think you have taken too much of this medicine contact apoison control center or emergency room at once. NOTE: This medicine is only for you. Do not share this medicine with others. What if I miss a dose? It is important not to miss your dose. Call your doctor or health careprofessional if you are unable to keep an appointment. What may interact with this medication? Interactions have not been studied. This list may not describe all possible interactions. Give your health care provider a list of all the medicines, herbs, non-prescription drugs, or dietary supplements you use. Also tell them if you smoke, drink alcohol, or use illegaldrugs. Some items may interact with your medicine. What should I watch for while using this medication? This drug may make you feel generally unwell. Continue your course of treatmenteven though you feel ill unless your doctor tells you to stop. You may need blood work done while you are taking this medicine. Do not become pregnant while taking this medicine or for 3 months after stopping it. Women should inform their doctor if they wish to become pregnant or think they might be pregnant. There is a potential for serious side effects to an unborn child. Talk to your health care professional or pharmacist for more information. Do  not breast-feed an infant while taking this medicine orfor 3 months after stopping it. What side effects may I notice from receiving this medication? Side effects that you should report to your doctor or health care professionalas soon as possible: allergic reactions like skin rash, itching or hives, swelling of the face, lips, or tongue black, tarry stools bloody or watery diarrhea breathing problems change in emotions or moods change in sex drive changes in vision chest pain or chest tightness chills confusion cough facial flushing fever headache signs and symptoms of high blood sugar such as dizziness; dry mouth; dry skin; fruity breath; nausea; stomach pain; increased hunger or thirst; increased urination signs and symptoms of liver injury like dark yellow or brown urine; general ill feeling or flu-like symptoms; light-colored stools; loss of appetite; nausea; right upper belly pain; unusually weak or tired; yellowing of the eyes or skin stomach pain trouble passing urine or change in the amount of urine weight gain or weight loss Side effects that usually do not require medical attention (report these toyour doctor or health care professional if they continue or are bothersome): bone pain constipation loss of appetite muscle pain nausea swelling of the ankles, feet, hands tiredness This list may not describe all possible side effects. Call your doctor for medical advice about side effects. You may report side effects to FDA at1-800-FDA-1088.  Where should I keep my medication? This drug is given in a hospital or clinic and will not be stored at home. NOTE: This sheet is a summary. It may not cover all possible information. If you have questions about this medicine, talk to your doctor, pharmacist, orhealth care provider.  2022 Elsevier/Gold Standard (2019-03-14 13:01:29)

## 2020-07-28 ENCOUNTER — Inpatient Hospital Stay: Payer: Medicare Other

## 2020-07-28 VITALS — BP 128/89 | HR 68 | Temp 98.4°F | Resp 18

## 2020-07-28 DIAGNOSIS — C3491 Malignant neoplasm of unspecified part of right bronchus or lung: Secondary | ICD-10-CM

## 2020-07-28 DIAGNOSIS — Z5111 Encounter for antineoplastic chemotherapy: Secondary | ICD-10-CM | POA: Diagnosis not present

## 2020-07-28 MED ORDER — SODIUM CHLORIDE 0.9 % IV SOLN
10.0000 mg | Freq: Once | INTRAVENOUS | Status: AC
  Administered 2020-07-28: 10 mg via INTRAVENOUS
  Filled 2020-07-28: qty 10

## 2020-07-28 MED ORDER — SODIUM CHLORIDE 0.9 % IV SOLN
100.0000 mg/m2 | Freq: Once | INTRAVENOUS | Status: AC
  Administered 2020-07-28: 220 mg via INTRAVENOUS
  Filled 2020-07-28: qty 11

## 2020-07-28 MED ORDER — TRILACICLIB DIHYDROCHLORIDE INJECTION 300 MG
240.0000 mg/m2 | Freq: Once | INTRAVENOUS | Status: AC
  Administered 2020-07-28: 510 mg via INTRAVENOUS
  Filled 2020-07-28: qty 34

## 2020-07-28 MED ORDER — SODIUM CHLORIDE 0.9 % IV SOLN
Freq: Once | INTRAVENOUS | Status: AC
  Filled 2020-07-28: qty 250

## 2020-07-28 NOTE — Progress Notes (Signed)
Pt reports some burning about 1/2 way through cosela infusion despite dilution and warm packs. Blood return present. No swelling or redness at the site. Pt reports that the burning is less severe than yesterday. Pharmacy notified. Will infuse cosela over one hour w/ next treatment to see if that helps with the burning

## 2020-07-28 NOTE — Patient Instructions (Signed)
[name]  Discharge Instructions: Thank you for choosing [facility] to provide your oncology and hematology care.   If you have a lab appointment with the [facility], please go directly to the [facility] and check in at the registration area.   Wear comfortable clothing and clothing appropriate for easy access to any Portacath or PICC line.   We strive to give you quality time with your provider. You may need to reschedule your appointment if you arrive late (15 or more minutes).  Arriving late affects you and other patients whose appointments are after yours.  Also, if you miss three or more appointments without notifying the office, you may be dismissed from the clinic at the provider's discretion.      For prescription refill requests, have your pharmacy contact our office and allow 72 hours for refills to be completed.    Today you received the following chemotherapy and/or immunotherapy agents Etoposide      To help prevent nausea and vomiting after your treatment, we encourage you to take your nausea medication as directed.  BELOW ARE SYMPTOMS THAT SHOULD BE REPORTED IMMEDIATELY: *FEVER GREATER THAN 100.4 F (38 C) OR HIGHER *CHILLS OR SWEATING *NAUSEA AND VOMITING THAT IS NOT CONTROLLED WITH YOUR NAUSEA MEDICATION *UNUSUAL SHORTNESS OF BREATH *UNUSUAL BRUISING OR BLEEDING *URINARY PROBLEMS (pain or burning when urinating, or frequent urination) *BOWEL PROBLEMS (unusual diarrhea, constipation, pain near the anus) TENDERNESS IN MOUTH AND THROAT WITH OR WITHOUT PRESENCE OF ULCERS (sore throat, sores in mouth, or a toothache) UNUSUAL RASH, SWELLING OR PAIN  UNUSUAL VAGINAL DISCHARGE OR ITCHING   Items with * indicate a potential emergency and should be followed up as soon as possible or go to the Emergency Department if any problems should occur.  Please show the CHEMOTHERAPY ALERT CARD or IMMUNOTHERAPY ALERT CARD at check-in to  the Emergency Department and triage nurse.  Should you have questions after your visit or need to cancel or reschedule your appointment, please contact [facility]  Dept: [phone]  and follow the prompts.  Office hours are 8:00 a.m. to 4:30 p.m. Monday - Friday. Please note that voicemails left after 4:00 p.m. may not be returned until the following business day.  We are closed weekends and major holidays. You have access to a nurse at all times for urgent questions. Please call the main number to the clinic Dept: [phone] and follow the prompts.   For any non-urgent questions, you may also contact your provider using MyChart. We now offer e-Visits for anyone 56 and older to request care online for non-urgent symptoms. For details visit [email or web address].   Also download the MyChart app! Go to the app store, search "MyChart", open the app, select [facility], and log in with your MyChart username and password.  Due to Covid, a mask is required upon entering the hospital/clinic. If you do not have a mask, one will be given to you upon arrival. For doctor visits, patients may have 1 support person aged 46 or older with them. For treatment visits, patients cannot have anyone with them due to current Covid guidelines and our immunocompromised population.

## 2020-07-29 ENCOUNTER — Other Ambulatory Visit (HOSPITAL_COMMUNITY): Payer: Self-pay

## 2020-07-29 ENCOUNTER — Inpatient Hospital Stay: Payer: Medicare Other

## 2020-07-29 ENCOUNTER — Other Ambulatory Visit: Payer: Self-pay

## 2020-07-29 VITALS — BP 122/78 | HR 68 | Temp 98.5°F | Resp 18

## 2020-07-29 DIAGNOSIS — Z5111 Encounter for antineoplastic chemotherapy: Secondary | ICD-10-CM | POA: Diagnosis not present

## 2020-07-29 DIAGNOSIS — C3491 Malignant neoplasm of unspecified part of right bronchus or lung: Secondary | ICD-10-CM

## 2020-07-29 MED ORDER — SODIUM CHLORIDE 0.9 % IV SOLN
10.0000 mg | Freq: Once | INTRAVENOUS | Status: AC
  Administered 2020-07-29: 10 mg via INTRAVENOUS
  Filled 2020-07-29: qty 10

## 2020-07-29 MED ORDER — SODIUM CHLORIDE 0.9 % IV SOLN
Freq: Once | INTRAVENOUS | Status: AC
  Filled 2020-07-29: qty 250

## 2020-07-29 MED ORDER — SODIUM CHLORIDE 0.9 % IV SOLN
100.0000 mg/m2 | Freq: Once | INTRAVENOUS | Status: AC
  Administered 2020-07-29: 220 mg via INTRAVENOUS
  Filled 2020-07-29: qty 11

## 2020-07-29 MED ORDER — DEXTROSE 5 % IV SOLN
240.0000 mg/m2 | Freq: Once | INTRAVENOUS | Status: AC
Start: 2020-07-29 — End: 2020-07-29
  Administered 2020-07-29: 510 mg via INTRAVENOUS
  Filled 2020-07-29: qty 34

## 2020-07-29 MED ORDER — TRILACICLIB DIHYDROCHLORIDE INJECTION 300 MG: 240.0000 mg/m2 | Freq: Once | INTRAVENOUS | Status: DC

## 2020-07-29 MED FILL — Gabapentin Cap 400 MG: ORAL | 30 days supply | Qty: 180 | Fill #3 | Status: AC

## 2020-07-29 NOTE — Patient Instructions (Signed)
[facility]  Discharge Instructions: Thank you for choosing [facility] to provide your oncology and hematology care.   If you have a lab appointment with the [facility], please go directly to the [facility] and check in at the registration area.   Wear comfortable clothing and clothing appropriate for easy access to any Portacath or PICC line.   We strive to give you quality time with your provider. You may need to reschedule your appointment if you arrive late (15 or more minutes).  Arriving late affects you and other patients whose appointments are after yours.  Also, if you miss three or more appointments without notifying the office, you may be dismissed from the clinic at the provider's discretion.      For prescription refill requests, have your pharmacy contact our office and allow 72 hours for refills to be completed.    Today you received the following chemotherapy and/or immunotherapy agents Etoposide      To help prevent nausea and vomiting after your treatment, we encourage you to take your nausea medication as directed.  BELOW ARE SYMPTOMS THAT SHOULD BE REPORTED IMMEDIATELY: *FEVER GREATER THAN 100.4 F (38 C) OR HIGHER *CHILLS OR SWEATING *NAUSEA AND VOMITING THAT IS NOT CONTROLLED WITH YOUR NAUSEA MEDICATION *UNUSUAL SHORTNESS OF BREATH *UNUSUAL BRUISING OR BLEEDING *URINARY PROBLEMS (pain or burning when urinating, or frequent urination) *BOWEL PROBLEMS (unusual diarrhea, constipation, pain near the anus) TENDERNESS IN MOUTH AND THROAT WITH OR WITHOUT PRESENCE OF ULCERS (sore throat, sores in mouth, or a toothache) UNUSUAL RASH, SWELLING OR PAIN  UNUSUAL VAGINAL DISCHARGE OR ITCHING   Items with * indicate a potential emergency and should be followed up as soon as possible or go to the Emergency Department if any problems should occur.  Please show the CHEMOTHERAPY ALERT CARD or IMMUNOTHERAPY ALERT CARD at check-in to  the Emergency Department and triage nurse.  Should you have questions after your visit or need to cancel or reschedule your appointment, please contact [facility]  Dept: [phone]  and follow the prompts.  Office hours are 8:00 a.m. to 4:30 p.m. Monday - Friday. Please note that voicemails left after 4:00 p.m. may not be returned until the following business day.  We are closed weekends and major holidays. You have access to a nurse at all times for urgent questions. Please call the main number to the clinic Dept: [phone] and follow the prompts.   For any non-urgent questions, you may also contact your provider using MyChart. We now offer e-Visits for anyone 57 and older to request care online for non-urgent symptoms. For details visit [email or web address].   Also download the MyChart app! Go to the app store, search "MyChart", open the app, select [facility], and log in with your MyChart username and password.  Due to Covid, a mask is required upon entering the hospital/clinic. If you do not have a mask, one will be given to you upon arrival. For doctor visits, patients may have 1 support person aged 52 or older with them. For treatment visits, patients cannot have anyone with them due to current Covid guidelines and our immunocompromised population.

## 2020-08-03 ENCOUNTER — Inpatient Hospital Stay: Payer: Medicare Other

## 2020-08-03 ENCOUNTER — Other Ambulatory Visit: Payer: Medicare Other

## 2020-08-04 ENCOUNTER — Other Ambulatory Visit: Payer: Self-pay

## 2020-08-04 ENCOUNTER — Inpatient Hospital Stay: Payer: Medicare Other

## 2020-08-04 DIAGNOSIS — C3491 Malignant neoplasm of unspecified part of right bronchus or lung: Secondary | ICD-10-CM

## 2020-08-04 DIAGNOSIS — Z5111 Encounter for antineoplastic chemotherapy: Secondary | ICD-10-CM | POA: Diagnosis not present

## 2020-08-04 LAB — CBC WITH DIFFERENTIAL (CANCER CENTER ONLY)
Abs Immature Granulocytes: 0.07 10*3/uL (ref 0.00–0.07)
Basophils Absolute: 0 10*3/uL (ref 0.0–0.1)
Basophils Relative: 0 %
Eosinophils Absolute: 0.1 10*3/uL (ref 0.0–0.5)
Eosinophils Relative: 1 %
HCT: 37.5 % — ABNORMAL LOW (ref 39.0–52.0)
Hemoglobin: 12.4 g/dL — ABNORMAL LOW (ref 13.0–17.0)
Immature Granulocytes: 1 %
Lymphocytes Relative: 28 %
Lymphs Abs: 1.3 10*3/uL (ref 0.7–4.0)
MCH: 28.7 pg (ref 26.0–34.0)
MCHC: 33.1 g/dL (ref 30.0–36.0)
MCV: 86.8 fL (ref 80.0–100.0)
Monocytes Absolute: 0.1 10*3/uL (ref 0.1–1.0)
Monocytes Relative: 3 %
Neutro Abs: 3.2 10*3/uL (ref 1.7–7.7)
Neutrophils Relative %: 67 %
Platelet Count: 140 10*3/uL — ABNORMAL LOW (ref 150–400)
RBC: 4.32 MIL/uL (ref 4.22–5.81)
RDW: 13.8 % (ref 11.5–15.5)
WBC Count: 4.9 10*3/uL (ref 4.0–10.5)
nRBC: 0 % (ref 0.0–0.2)

## 2020-08-04 LAB — CMP (CANCER CENTER ONLY)
ALT: 12 U/L (ref 0–44)
AST: 11 U/L — ABNORMAL LOW (ref 15–41)
Albumin: 3.3 g/dL — ABNORMAL LOW (ref 3.5–5.0)
Alkaline Phosphatase: 82 U/L (ref 38–126)
Anion gap: 8 (ref 5–15)
BUN: 10 mg/dL (ref 8–23)
CO2: 33 mmol/L — ABNORMAL HIGH (ref 22–32)
Calcium: 9.2 mg/dL (ref 8.9–10.3)
Chloride: 95 mmol/L — ABNORMAL LOW (ref 98–111)
Creatinine: 0.9 mg/dL (ref 0.61–1.24)
GFR, Estimated: >60 mL/min (ref >60)
Glucose, Bld: 154 mg/dL — ABNORMAL HIGH (ref 70–99)
Potassium: 4.1 mmol/L (ref 3.5–5.1)
Sodium: 136 mmol/L (ref 135–145)
Total Bilirubin: 0.3 mg/dL (ref 0.3–1.2)
Total Protein: 7.3 g/dL (ref 6.5–8.1)

## 2020-08-10 ENCOUNTER — Inpatient Hospital Stay: Payer: Medicare Other

## 2020-08-10 ENCOUNTER — Other Ambulatory Visit: Payer: Self-pay

## 2020-08-10 DIAGNOSIS — C3491 Malignant neoplasm of unspecified part of right bronchus or lung: Secondary | ICD-10-CM

## 2020-08-10 DIAGNOSIS — Z5111 Encounter for antineoplastic chemotherapy: Secondary | ICD-10-CM | POA: Diagnosis not present

## 2020-08-10 LAB — CBC WITH DIFFERENTIAL (CANCER CENTER ONLY)
Abs Immature Granulocytes: 0 10*3/uL (ref 0.00–0.07)
Basophils Absolute: 0 10*3/uL (ref 0.0–0.1)
Basophils Relative: 1 %
Eosinophils Absolute: 0 10*3/uL (ref 0.0–0.5)
Eosinophils Relative: 1 %
HCT: 35.5 % — ABNORMAL LOW (ref 39.0–52.0)
Hemoglobin: 12 g/dL — ABNORMAL LOW (ref 13.0–17.0)
Immature Granulocytes: 0 %
Lymphocytes Relative: 42 %
Lymphs Abs: 1.3 10*3/uL (ref 0.7–4.0)
MCH: 28.6 pg (ref 26.0–34.0)
MCHC: 33.8 g/dL (ref 30.0–36.0)
MCV: 84.7 fL (ref 80.0–100.0)
Monocytes Absolute: 0.5 10*3/uL (ref 0.1–1.0)
Monocytes Relative: 17 %
Neutro Abs: 1.2 10*3/uL — ABNORMAL LOW (ref 1.7–7.7)
Neutrophils Relative %: 39 %
Platelet Count: 106 10*3/uL — ABNORMAL LOW (ref 150–400)
RBC: 4.19 MIL/uL — ABNORMAL LOW (ref 4.22–5.81)
RDW: 13.7 % (ref 11.5–15.5)
WBC Count: 3.1 10*3/uL — ABNORMAL LOW (ref 4.0–10.5)
nRBC: 0 % (ref 0.0–0.2)

## 2020-08-10 LAB — CMP (CANCER CENTER ONLY)
ALT: 10 U/L (ref 0–44)
AST: 8 U/L — ABNORMAL LOW (ref 15–41)
Albumin: 3.2 g/dL — ABNORMAL LOW (ref 3.5–5.0)
Alkaline Phosphatase: 81 U/L (ref 38–126)
Anion gap: 10 (ref 5–15)
BUN: 11 mg/dL (ref 8–23)
CO2: 28 mmol/L (ref 22–32)
Calcium: 9.2 mg/dL (ref 8.9–10.3)
Chloride: 98 mmol/L (ref 98–111)
Creatinine: 0.98 mg/dL (ref 0.61–1.24)
GFR, Estimated: >60 mL/min (ref >60)
Glucose, Bld: 150 mg/dL — ABNORMAL HIGH (ref 70–99)
Potassium: 3.5 mmol/L (ref 3.5–5.1)
Sodium: 136 mmol/L (ref 135–145)
Total Bilirubin: 0.3 mg/dL (ref 0.3–1.2)
Total Protein: 7.2 g/dL (ref 6.5–8.1)

## 2020-08-11 ENCOUNTER — Other Ambulatory Visit (HOSPITAL_COMMUNITY): Payer: Self-pay

## 2020-08-11 MED FILL — Budesonide-Formoterol Fumarate Dihyd Aerosol 160-4.5 MCG/ACT: RESPIRATORY_TRACT | 30 days supply | Qty: 10.2 | Fill #3 | Status: AC

## 2020-08-11 MED FILL — Hydrochlorothiazide Cap 12.5 MG: ORAL | 90 days supply | Qty: 90 | Fill #1 | Status: AC

## 2020-08-11 MED FILL — Tamsulosin HCl Cap 0.4 MG: ORAL | 90 days supply | Qty: 90 | Fill #1 | Status: AC

## 2020-08-13 ENCOUNTER — Encounter: Payer: Self-pay | Admitting: Cardiology

## 2020-08-13 ENCOUNTER — Other Ambulatory Visit: Payer: Self-pay

## 2020-08-13 ENCOUNTER — Ambulatory Visit: Payer: Medicare Other | Admitting: Cardiology

## 2020-08-13 VITALS — BP 131/76 | HR 70 | Temp 100.0°F | Resp 17 | Ht 72.0 in | Wt 199.6 lb

## 2020-08-13 DIAGNOSIS — I2510 Atherosclerotic heart disease of native coronary artery without angina pectoris: Secondary | ICD-10-CM

## 2020-08-13 DIAGNOSIS — C3490 Malignant neoplasm of unspecified part of unspecified bronchus or lung: Secondary | ICD-10-CM

## 2020-08-13 DIAGNOSIS — Z66 Do not resuscitate: Secondary | ICD-10-CM

## 2020-08-13 NOTE — Progress Notes (Signed)
Primary Physician/Referring:  [name], MD  Patient ID: Jonathon [name], male    DOB: 1950-07-08, 70 y.o.   MRN: [id number]  Chief Complaint  Patient presents with   Coronary Artery Disease   Follow-up   HPI:    Jonathon [name]  is a 70 y.o.  [name] male patient with history of hypertension, hyperlipidemia, remote stroke without restrictive defect, tobacco cessation in September 2018, CAD with PCI in December 2017 to RCA. He also has history of emphysema, pulmonary nodules and right pneumothorax in 2010 due to emphysematous bleb while he was in [place] and has history of right upper lobe lobectomy for lung cancer.  He has had recurrence of cancer in the right lower lobe and finished radiation therapy.  Unfortunately he has now had a third recurrence of metastatic lung cancer and has now been recommended palliative chemotherapy and immunotherapy.  Patient presents for 3 month follow up hypertension, CAD.  He is also been concerned about new CT findings of recurrence of lung cancer and presence of nodules close to the heart.  Is tolerating all his medications well, states that his blood pressure is well controlled, diabetes is now improved.  Dyspnea has remained stable.  He has not had any hemoptysis, leg edema, recent hospitalizations.     Past Medical History:  Diagnosis Date   Cancer ([place])    CHF (congestive heart failure) (HCC)    Chronic cough    COPD, severe (HCC)    PT DENIES REGULAR DAILY INHALER ANY PRN   Coronary artery disease    CVA (cerebral vascular accident) ([place]) 02/22/2011   LEFT BRAIN STEM PONTINE HEMORRHAGE--  RIGHT SIDED WEAKNESS   Diabetes mellitus without complication (HCC)    type 2   Dyspnea    GERD (gastroesophageal reflux disease)    Harsh voice quality    PT STATES NORMAL FOR HIM   High cholesterol    History of CHF (congestive heart failure) MONITORED BY DR Coletta Memos   DIASTOLIC   History of radiation therapy 03/13/19-03/20/19   SBRT  Right Lung; Dr. Gery Pray   History of radiation therapy 02/17/20-02/28/20   Whole brain radiation; Dr. Gery Pray   History of radiation therapy 05/25/2020   05/18/2020-05/25/2020  SBRT Right Lung; Dr. Gery Pray   Hydrocele, left    Hypertension    NSTEMI (non-ST elevated myocardial infarction) ([place]) 12/2015   Pre-operative clearance    GIVEN BY DR Coletta Memos   Short of breath on exertion    Smoker    Weakness of right side of body    S/P CVA   FEB 2013   Past Surgical History:  Procedure Laterality Date   ABDOMINAL ANGIOGRAM  09/04/2012   Procedure: ABDOMINAL ANGIOGRAM;  Surgeon: Laverda Page, MD;  Location: [facility] CATH LAB;  Service: Cardiovascular;;   CARDIAC CATHETERIZATION  05-02-2011  DR Johnsie Cancel   MILD NONOBSTRUCTIVE CAD/ LAD, OM , AND D1/ EF 60^   CARDIAC CATHETERIZATION  AUG 2000   NON-OBSTRUTIVE CAD/ EF 68%/ 25% PROXIMAL LAD/ 20% MID CIRCUMFLEX   CARDIAC CATHETERIZATION [name] 12/23/2015   Procedure: Left Heart Cath and Coronary Angiography;  Surgeon: Adrian Prows, MD;  Location: [place] CV LAB;  Service: Cardiovascular;  Laterality: [name];   CARDIAC CATHETERIZATION [name] 12/24/2015   Procedure: Coronary Stent Intervention;  Surgeon: Adrian Prows, MD;  Location: [facility] CV LAB;  Service: Cardiovascular;  Laterality: [name];   CORONARY ANGIOPLASTY     CORONARY STENT PLACEMENT  12/24/2015  PTCA and stenting of the distal RCA    HYDROCELE EXCISION Left 04/24/2012   Procedure: HYDROCELECTOMY ADULT;  Surgeon: Fredricka Bonine, MD;  Location: [facility];  Service: Urology;  Laterality: Left;  90 mins req for this case      [place]   LEFT AND RIGHT HEART CATHETERIZATION WITH CORONARY ANGIOGRAM [name] 09/04/2012   Procedure: LEFT AND RIGHT HEART CATHETERIZATION WITH CORONARY ANGIOGRAM;  Surgeon: Laverda Page, MD;  Location: [facility] CATH LAB;  Service: Cardiovascular;  Laterality: [name];   LEFT HEART CATHETERIZATION WITH CORONARY ANGIOGRAM [name]  05/02/2011   Procedure: LEFT HEART CATHETERIZATION WITH CORONARY ANGIOGRAM;  Surgeon: Josue Hector, MD;  Location: [facility] CATH LAB;  Service: Cardiovascular;  Laterality: [name];   THORACOTOMY/LOBECTOMY Right 03-06-2008   AND STAPLING OF BLEBS FOR SPONTANOUS PNEUMOTHORAX   TRANSTHORACIC ECHOCARDIOGRAM  02-17-2011   MODERATE LVH/ LVSF NORMAL/ EF 14-97%/ GRADE I DIASTOLIC DYSFUNCTION   Social History   Tobacco Use   Smoking status: Every Day    Packs/day: 0.00    Years: 53.00    Pack years: 0.00    Types: Cigarettes    Last attempt to quit: 2019    Years since quitting: 3.5   Smokeless tobacco: Never   Tobacco comments:    07/27/20: 3-4 cigarettes daily  Substance Use Topics   Alcohol use: Not Currently    Alcohol/week: 0.0 standard drinks    Comment: 02/10/2016 "nothing in the last 10 years"   Marital status: Married   ROS  Review of Systems  Constitutional: Negative for malaise/fatigue and weight gain.  Cardiovascular:  Negative for chest pain, claudication, leg swelling, near-syncope, orthopnea, palpitations, paroxysmal nocturnal dyspnea and syncope.  Respiratory:  Positive for cough, shortness of breath and wheezing.   Hematologic/Lymphatic: Does not bruise/bleed easily.  Gastrointestinal:  Negative for melena.  Neurological:  Negative for dizziness and weakness.  Objective   Vitals with BMI 08/13/2020 08/13/2020 07/29/2020  Height - 6\' 0"  -  Weight - 199 lbs 10 oz -  BMI - 02.63 -  Systolic 785 885 027  Diastolic 76 86 78  Pulse 70 85 68    Blood pressure 131/76, pulse 70, temperature 100 F (37.8 C), temperature source Temporal, resp. rate 17, height 6' (1.829 m), weight 199 lb 9.6 oz (90.5 kg), SpO2 97 %. Body mass index is 27.07 kg/m.   Physical Exam Vitals reviewed.  Constitutional:      Comments: Well-built, mildly obese in no acute distress.  Cardiovascular:     Rate and Rhythm: Normal rate and regular rhythm.     Pulses: Normal pulses and intact distal pulses.      Heart sounds: Normal heart sounds. No murmur heard.   No gallop.     Comments: No JVD.  Pulmonary:     Effort: Pulmonary effort is normal.     Breath sounds: Wheezing (diffuse scattered occasional) and rales (left base) present.  Abdominal:     General: Bowel sounds are normal.     Palpations: Abdomen is soft.  Musculoskeletal:        General: Normal range of motion.     Right lower leg: No edema.     Left lower leg: No edema.  Skin:    General: Skin is warm and dry.     Capillary Refill: Capillary refill takes less than 2 seconds.  Neurological:     General: No focal deficit present.     Mental Status: He is  alert.    Laboratory examination:   CMP Latest Ref Rng & Units 08/10/2020 08/04/2020 07/27/2020  Glucose 70 - 99 mg/dL 150(H) 154(H) 154(H)  BUN 8 - 23 mg/dL 11 10 12   Creatinine 0.61 - 1.24 mg/dL 0.98 0.90 1.12  Sodium 135 - 145 mmol/L 136 136 135  Potassium 3.5 - 5.1 mmol/L 3.5 4.1 3.3(L)  Chloride 98 - 111 mmol/L 98 95(L) 99  CO2 22 - 32 mmol/L 28 33(H) 27  Calcium 8.9 - 10.3 mg/dL 9.2 9.2 9.8  Total Protein 6.5 - 8.1 g/dL 7.2 7.3 7.8  Total Bilirubin 0.3 - 1.2 mg/dL 0.3 0.3 0.4  Alkaline Phos 38 - 126 U/L 81 82 78  AST 15 - 41 U/L 8(L) 11(L) 9(L)  ALT 0 - 44 U/L 10 12 8    CBC Latest Ref Rng & Units 08/10/2020 08/04/2020 07/27/2020  WBC 4.0 - 10.5 K/uL 3.1(L) 4.9 8.4  Hemoglobin 13.0 - 17.0 g/dL 12.0(L) 12.4(L) 13.4  Hematocrit 39.0 - 52.0 % 35.5(L) 37.5(L) 39.5  Platelets 150 - 400 K/uL 106(L) 140(L) 250   TSH Recent Labs    07/27/20 0848  TSH 1.238      External Labs:  11/27/2019: A1c 7.0%. Total cholesterol 135, triglycerides 103, HDL 62, LDL 54.  Medications   Allergies  Allergen Reactions   Atorvastatin Other (See Comments)    Severe constipation      Current Outpatient Medications  Medication Instructions   albuterol (PROVENTIL) 2.5 mg, Nebulization, Every 4 hours PRN   Albuterol Sulfate (PROAIR RESPICLICK) 841 (90 BASE) MCG/ACT AEPB 2  puffs, Inhalation, 4 times daily PRN   amLODipine (NORVASC) 10 MG tablet TAKE 1 TABLET BY MOUTH ONCE A DAY   atorvastatin (LIPITOR) 20 mg, Oral, Daily   bisoprolol (ZEBETA) 5 MG tablet TAKE ONE (1) TABLET BY MOUTH EVERY DAY   cloNIDine (CATAPRES) 0.3 MG tablet TAKE 1 TABLET BY MOUTH TWICE DAILY   Cyanocobalamin (VITAMIN B 12 PO) 1 tablet, Oral, BH-each morning   cyclobenzaprine (FLEXERIL) 5 MG tablet TAKE 1 TABLET BY MOUTH AT BEDTIME AS NEEDED FOR MUSCLE SPASMS   dabigatran (PRADAXA) 150 mg, Oral, 2 times daily   DULoxetine (CYMBALTA) 30 MG capsule TAKE ONE CAPSULE BY MOUTH DAILY   gabapentin (NEURONTIN) 400 MG capsule TAKE 2 CAPSULES BY MOUTH THREE TIMES A DAY   hydrochlorothiazide (MICROZIDE) 12.5 MG capsule TAKE 1 CAPSULE (12.5 MG TOTAL) BY MOUTH DAILY.   losartan (COZAAR) 100 MG tablet TAKE 1 TABLET (100 MG TOTAL) BY MOUTH DAILY.   metFORMIN (GLUCOPHAGE) 500 MG tablet TAKE 1 TABLET BY MOUTH ONCE A DAY WITH BREAKFAST   mupirocin cream (BACTROBAN) 2 % APPLY TOPICALLY 2 (TWO) TIMES DAILY.   nitroGLYCERIN (NITROSTAT) 0.4 MG SL tablet PLACE ONE TABLET (0.4 MG DOSE) UNDER THE TONGUE EVERY 5 (FIVE) MINUTES AS NEEDED.   nortriptyline (PAMELOR) 25 MG capsule TAKE 1 CAPSULE BY MOUTH EVERY NIGHT AT BEDTIME   pantoprazole (PROTONIX) 20 MG tablet TAKE 1 TABLET BY MOUTH DAILY   prochlorperazine (COMPAZINE) 10 mg, Oral, Every 6 hours PRN   SYMBICORT 160-4.5 MCG/ACT inhaler INHALE 2 PUFFS INTO THE LUNGS TWICE DAILY   tamsulosin (FLOMAX) 0.4 MG CAPS capsule TAKE 1 CAPSULE BY MOUTH DAILY   temazepam (RESTORIL) 15 MG capsule Oral   Tiotropium Bromide Monohydrate (SPIRIVA RESPIMAT) 2.5 MCG/ACT AERS Inhale two puffs into the lungs as needed.    Radiology:  No results found.  CT scan of the chest with contrast 11/14/2019: 1. Interval response to therapy.  The right juxta hilar lung nodule has decreased in size in the interval. The subpleural nodule within the periphery of the right lower lobe lung is also  decreased in size in the interval. The tiny nodule within the right upper lung is stable to mildly decreased in the interval. 2. Stable to mild decrease in size of right hilar and right paratracheal lymph nodes. 3. Emphysema and aortic atherosclerosis. 4. Coronary artery calcifications.  Compared to 01/24/2019, there is no mention of descending thoracic aortic aneurysm measuring 3.3 x 3.3 cm.  Cardiac Studies:   Coronary angiogram 12/24/2015: PTCA and stenting of the distal RCA with 4.0 x 12 mm onyx DES. Normal LVEF. Mild disease in other vessels.  Lexiscan myoview stress test 06/19/2017:  1. Lexiscan stress test was performed. Exercise capacity was not assessed. Stress symptoms included shortness of breath, lightheadedness, and chest pressure. Blood pressure was 126/78 mmHg. Stress EKG is non diagnostic for ischemia as it is a pharmacologic stress. In addition, it showed normal sinus rhythm, normal stress conduction, no stress arrhythmias and normal stress repolarization.   2. The overall quality of the study is good. There is no evidence of abnormal lung activity. Stress and rest SPECT images demonstrate homogeneous tracer distribution throughout the myocardium. Gated SPECT imaging reveals normal myocardial thickening and wall motion. The left ventricular ejection fraction was calculated as 43%, however visually appears normal. 3. Low to intermediate risk study.  Echocardiogram 08/08/2017:  Left ventricle cavity is normal in size. Mild concentric hypertrophy of the left ventricle. Normal global wall motion. Normal diastolic filling pattern. Calculated EF 52%. Mild to moderate aortic regurgitation. Mild (Grade I) mitral regurgitation. Inadequate tricuspid regurgitation jet to estimate pulmonary artery pressure. Normal right atrial pressure. No significant change compared to previous study on 01/06/2016.   EKG:   EKG 12/02/2019: Normal sinus rhythm at rate of 66 bpm, leftward axis, incomplete  right bundle branch block.  No significant change from 05/07/2019.   Assessment     ICD-10-CM   1. Coronary artery disease involving native coronary artery of native heart without angina pectoris  I25.10 EKG 12-Lead      No orders of the defined types were placed in this encounter.  There are no discontinued medications.   Recommendations:   Jonathon [name]  is a 70 y.o. [name] male patient with history of hypertension, hyperlipidemia, remote stroke without restrictive defect, tobacco cessation in September 2018, CAD with PCI in December 2017 to RCA. He also has history of emphysema, pulmonary nodules and right pneumothorax in 2010 due to emphysematous bleb while he was in [place] and has history of right upper lobe lobectomy for lung cancer.  He has had recurrence of cancer in the right lower lobe and finished radiation therapy.  Unfortunately he has now had a third recurrence of metastatic lung cancer and has now been recommended palliative chemotherapy and immunotherapy.  From cardiac standpoint there is no clinical evidence of heart failure, blood pressure is well controlled, is tolerating all his medications well and has not had any recurrence of angina.  I had a very long 40-minute discussion with the patient and his wife at the bedside regarding extremely poor outcome overall in view of metastatic lung cancer.  His quality of life has also become extremely poor with every chemotherapy and immunotherapy he gets, states that he is wasted for 4 to 5 days.  End-of-life goals discussions were held.  He does not want to be resuscitated in view of his above  issues if he were to have cardiac arrest or respiratory arrest.  Patient's family was surprised that this discussion had not come up before and he and his wife were very relieved to discuss this openly.  He also plans to discuss with his oncologist regarding discontinuation of all chemotherapy and immunotherapy if there is no  response and probably accept disease process as it is.  His wife is also present at the bedside who is in agreement.  I will certainly forward my note to his oncologist as well.  I have completed the ACP documents, he will be no code, no CPR, DNR.  Full scope of therapy will be given however can be restricted by physicians or his wife at any point with regard to stopping antibiotics or nutrition or IV fluids.  This is a 40-minute office visit encounter with regard to discussions regarding heart failure, coronary artery disease and vascular disease and also additional 20 minutes regarding end-of-life discussion and creation of ACP documents.  As per his request and desire, he wants to come back and see me in 3 months.   Adrian Prows, PA-C 08/13/2020, 4:00 PM Office: [phone]

## 2020-08-17 ENCOUNTER — Inpatient Hospital Stay: Payer: Medicare Other

## 2020-08-17 ENCOUNTER — Other Ambulatory Visit: Payer: Self-pay | Admitting: Medical Oncology

## 2020-08-17 ENCOUNTER — Other Ambulatory Visit (HOSPITAL_COMMUNITY): Payer: Self-pay

## 2020-08-17 ENCOUNTER — Encounter: Payer: Self-pay | Admitting: Internal Medicine

## 2020-08-17 ENCOUNTER — Inpatient Hospital Stay: Payer: Medicare Other | Attending: Internal Medicine | Admitting: Internal Medicine

## 2020-08-17 ENCOUNTER — Other Ambulatory Visit: Payer: Self-pay

## 2020-08-17 VITALS — BP 109/64 | HR 59 | Temp 98.4°F | Resp 20 | Ht 72.0 in | Wt 198.7 lb

## 2020-08-17 DIAGNOSIS — R5383 Other fatigue: Secondary | ICD-10-CM | POA: Diagnosis not present

## 2020-08-17 DIAGNOSIS — Z8673 Personal history of transient ischemic attack (TIA), and cerebral infarction without residual deficits: Secondary | ICD-10-CM | POA: Diagnosis not present

## 2020-08-17 DIAGNOSIS — E119 Type 2 diabetes mellitus without complications: Secondary | ICD-10-CM | POA: Diagnosis not present

## 2020-08-17 DIAGNOSIS — Z7984 Long term (current) use of oral hypoglycemic drugs: Secondary | ICD-10-CM | POA: Diagnosis not present

## 2020-08-17 DIAGNOSIS — I509 Heart failure, unspecified: Secondary | ICD-10-CM | POA: Diagnosis not present

## 2020-08-17 DIAGNOSIS — I809 Phlebitis and thrombophlebitis of unspecified site: Secondary | ICD-10-CM | POA: Insufficient documentation

## 2020-08-17 DIAGNOSIS — C3431 Malignant neoplasm of lower lobe, right bronchus or lung: Secondary | ICD-10-CM | POA: Diagnosis present

## 2020-08-17 DIAGNOSIS — J449 Chronic obstructive pulmonary disease, unspecified: Secondary | ICD-10-CM | POA: Insufficient documentation

## 2020-08-17 DIAGNOSIS — C771 Secondary and unspecified malignant neoplasm of intrathoracic lymph nodes: Secondary | ICD-10-CM | POA: Diagnosis not present

## 2020-08-17 DIAGNOSIS — Z5111 Encounter for antineoplastic chemotherapy: Secondary | ICD-10-CM | POA: Diagnosis present

## 2020-08-17 DIAGNOSIS — C3491 Malignant neoplasm of unspecified part of right bronchus or lung: Secondary | ICD-10-CM | POA: Diagnosis not present

## 2020-08-17 DIAGNOSIS — I10 Essential (primary) hypertension: Secondary | ICD-10-CM | POA: Diagnosis not present

## 2020-08-17 DIAGNOSIS — Z5112 Encounter for antineoplastic immunotherapy: Secondary | ICD-10-CM

## 2020-08-17 DIAGNOSIS — I878 Other specified disorders of veins: Secondary | ICD-10-CM

## 2020-08-17 DIAGNOSIS — Z5189 Encounter for other specified aftercare: Secondary | ICD-10-CM | POA: Diagnosis not present

## 2020-08-17 DIAGNOSIS — Z923 Personal history of irradiation: Secondary | ICD-10-CM | POA: Diagnosis not present

## 2020-08-17 DIAGNOSIS — C3490 Malignant neoplasm of unspecified part of unspecified bronchus or lung: Secondary | ICD-10-CM

## 2020-08-17 DIAGNOSIS — Z79899 Other long term (current) drug therapy: Secondary | ICD-10-CM | POA: Diagnosis not present

## 2020-08-17 LAB — CBC WITH DIFFERENTIAL (CANCER CENTER ONLY)
Abs Immature Granulocytes: 0.09 10*3/uL — ABNORMAL HIGH (ref 0.00–0.07)
Basophils Absolute: 0 10*3/uL (ref 0.0–0.1)
Basophils Relative: 0 %
Eosinophils Absolute: 0 10*3/uL (ref 0.0–0.5)
Eosinophils Relative: 0 %
HCT: 38.1 % — ABNORMAL LOW (ref 39.0–52.0)
Hemoglobin: 12.8 g/dL — ABNORMAL LOW (ref 13.0–17.0)
Immature Granulocytes: 1 %
Lymphocytes Relative: 17 %
Lymphs Abs: 1.1 10*3/uL (ref 0.7–4.0)
MCH: 28.5 pg (ref 26.0–34.0)
MCHC: 33.6 g/dL (ref 30.0–36.0)
MCV: 84.9 fL (ref 80.0–100.0)
Monocytes Absolute: 1.3 10*3/uL — ABNORMAL HIGH (ref 0.1–1.0)
Monocytes Relative: 20 %
Neutro Abs: 4 10*3/uL (ref 1.7–7.7)
Neutrophils Relative %: 62 %
Platelet Count: 309 10*3/uL (ref 150–400)
RBC: 4.49 MIL/uL (ref 4.22–5.81)
RDW: 14.5 % (ref 11.5–15.5)
WBC Count: 6.5 10*3/uL (ref 4.0–10.5)
nRBC: 0 % (ref 0.0–0.2)

## 2020-08-17 LAB — CMP (CANCER CENTER ONLY)
ALT: 9 U/L (ref 0–44)
AST: 9 U/L — ABNORMAL LOW (ref 15–41)
Albumin: 3.3 g/dL — ABNORMAL LOW (ref 3.5–5.0)
Alkaline Phosphatase: 79 U/L (ref 38–126)
Anion gap: 9 (ref 5–15)
BUN: 10 mg/dL (ref 8–23)
CO2: 29 mmol/L (ref 22–32)
Calcium: 9.4 mg/dL (ref 8.9–10.3)
Chloride: 98 mmol/L (ref 98–111)
Creatinine: 1.02 mg/dL (ref 0.61–1.24)
GFR, Estimated: >60 mL/min (ref >60)
Glucose, Bld: 136 mg/dL — ABNORMAL HIGH (ref 70–99)
Potassium: 3.2 mmol/L — ABNORMAL LOW (ref 3.5–5.1)
Sodium: 136 mmol/L (ref 135–145)
Total Bilirubin: 0.2 mg/dL — ABNORMAL LOW (ref 0.3–1.2)
Total Protein: 7.4 g/dL (ref 6.5–8.1)

## 2020-08-17 LAB — TSH: TSH: 1.089 u[IU]/mL (ref 0.320–4.118)

## 2020-08-17 MED ORDER — SODIUM CHLORIDE 0.9 % IV SOLN
560.0000 mg | Freq: Once | INTRAVENOUS | Status: AC
  Administered 2020-08-17: 560 mg via INTRAVENOUS
  Filled 2020-08-17: qty 56

## 2020-08-17 MED ORDER — SODIUM CHLORIDE 0.9 % IV SOLN
Freq: Once | INTRAVENOUS | Status: AC
Start: 2020-08-17 — End: 2020-08-17
  Filled 2020-08-17: qty 250

## 2020-08-17 MED ORDER — TRILACICLIB DIHYDROCHLORIDE INJECTION 300 MG
240.0000 mg/m2 | Freq: Once | INTRAVENOUS | Status: AC
  Administered 2020-08-17: 510 mg via INTRAVENOUS
  Filled 2020-08-17: qty 34

## 2020-08-17 MED ORDER — SODIUM CHLORIDE 0.9 % IV SOLN
100.0000 mg/m2 | Freq: Once | INTRAVENOUS | Status: AC
  Administered 2020-08-17: 220 mg via INTRAVENOUS
  Filled 2020-08-17: qty 11

## 2020-08-17 MED ORDER — SODIUM CHLORIDE 0.9 % IV SOLN
10.0000 mg | Freq: Once | INTRAVENOUS | Status: AC
  Administered 2020-08-17: 10 mg via INTRAVENOUS
  Filled 2020-08-17: qty 10

## 2020-08-17 MED ORDER — SODIUM CHLORIDE 0.9 % IV SOLN
150.0000 mg | Freq: Once | INTRAVENOUS | Status: AC
  Administered 2020-08-17: 150 mg via INTRAVENOUS
  Filled 2020-08-17: qty 150

## 2020-08-17 MED ORDER — PALONOSETRON HCL INJECTION 0.25 MG/5ML
0.2500 mg | Freq: Once | INTRAVENOUS | Status: AC
  Administered 2020-08-17: 0.25 mg via INTRAVENOUS

## 2020-08-17 MED ORDER — PALONOSETRON HCL INJECTION 0.25 MG/5ML
INTRAVENOUS | Status: AC
  Filled 2020-08-17: qty 5

## 2020-08-17 MED ORDER — SODIUM CHLORIDE 0.9 % IV SOLN
1500.0000 mg | Freq: Once | INTRAVENOUS | Status: AC
  Administered 2020-08-17: 1500 mg via INTRAVENOUS
  Filled 2020-08-17: qty 30

## 2020-08-17 NOTE — Progress Notes (Signed)
[facility] [phone]   [phone]  OFFICE PROGRESS NOTE  [name], MD [address] [place] [address]  DIAGNOSIS: Relapsed small cell lung cancer initially diagnosed as limited stage (T3, N2, M0) small cell lung cancer presented with pulmonary nodules and pleural-based nodules in the right lung in addition to mediastinal lymphadenopathy diagnosed in August 2021.  The patient was previously treated without biopsy to a pleural-based nodule in the right lower lobe with SBRT in March 2021 but had evidence for disease recurrence and progression.  He has disease relapse in June 2022.  PRIOR THERAPY:  1) SBRT to right lower lobe pulmonary nodule in March 2021 under the care of Dr. Sondra Come. 2) Systemic chemotherapy with carboplatin for AUC of 5 on day 1 and etoposide 100 mg/M2 on days 1, 2 and 3 with Neulasta support every 3 weeks.  First cycle 10/07/2019.  Status post 4 cycles.  Last dose of the chemotherapy was given on December 09, 2019.  CURRENT THERAPY: Second line systemic chemotherapy with carboplatin for AUC of 5 on day 1, etoposide 100 Mg/M2 on days 1, 2 and 3 with Cosela before the chemotherapy in addition to Imfinzi 1500 Mg IV on day 1 every 3 weeks.  First dose July 27, 2020.  INTERVAL HISTORY: Jonathon [name] 70 y.o. male returns to the clinic today for follow-up visit accompanied by his wife.  The patient is feeling fine today with no concerning complaints except for increasing fatigue and weakness especially after the chemotherapy.  He also has baseline shortness of breath secondary to COPD.  He was seen recently by his cardiologist Dr. [name] who advised him to consider palliative care and hospice based on his other comorbidities.  The patient denied having any chest pain but has cough with no hemoptysis.  He denied having any nausea, vomiting, diarrhea or constipation.  He denied having any headache or visual changes.   He is here today for evaluation before starting cycle #2 of his treatment with carboplatin, etoposide, Cosela and Imfinzi.   MEDICAL HISTORY: Past Medical History:  Diagnosis Date   Cancer ([facility])    CHF (congestive heart failure) (HCC)    Chronic cough    COPD, severe (HCC)    PT DENIES REGULAR DAILY INHALER ANY PRN   Coronary artery disease    CVA (cerebral vascular accident) ([facility]) 02/22/2011   LEFT BRAIN STEM PONTINE HEMORRHAGE--  RIGHT SIDED WEAKNESS   Diabetes mellitus without complication (HCC)    type 2   Dyspnea    GERD (gastroesophageal reflux disease)    Harsh voice quality    PT STATES NORMAL FOR HIM   High cholesterol    History of CHF (congestive heart failure) MONITORED BY DR Coletta Memos   DIASTOLIC   History of radiation therapy 03/13/19-03/20/19   SBRT Right Lung; Dr. Gery Pray   History of radiation therapy 02/17/20-02/28/20   Whole brain radiation; Dr. Gery Pray   History of radiation therapy 05/25/2020   05/18/2020-05/25/2020  SBRT Right Lung; Dr. Gery Pray   Hydrocele, left    Hypertension    NSTEMI (non-ST elevated myocardial infarction) ([place]) 12/2015   Pre-operative clearance    GIVEN BY DR Coletta Memos   Short of breath on exertion    Smoker    Weakness of right side of body    S/P CVA   FEB 2013    ALLERGIES:  is allergic to atorvastatin.  MEDICATIONS:  Current  Outpatient Medications  Medication Sig Dispense Refill   albuterol (PROVENTIL) (2.5 MG/3ML) 0.083% nebulizer solution Take 2.5 mg by nebulization every 4 (four) hours as needed for wheezing or shortness of breath.     Albuterol Sulfate (PROAIR RESPICLICK) 062 (90 BASE) MCG/ACT AEPB Inhale 2 puffs into the lungs 4 (four) times daily as needed. 1 each 5   amLODipine (NORVASC) 10 MG tablet TAKE 1 TABLET BY MOUTH ONCE A DAY 90 tablet 3   atorvastatin (LIPITOR) 20 MG tablet Take 20 mg by mouth daily.     bisoprolol (ZEBETA) 5 MG tablet TAKE ONE (1) TABLET BY MOUTH EVERY DAY 90 tablet 3    cloNIDine (CATAPRES) 0.3 MG tablet TAKE 1 TABLET BY MOUTH TWICE DAILY 180 tablet 3   Cyanocobalamin (VITAMIN B 12 PO) Take 1 tablet by mouth every morning.      cyclobenzaprine (FLEXERIL) 5 MG tablet TAKE 1 TABLET BY MOUTH AT BEDTIME AS NEEDED FOR MUSCLE SPASMS 30 tablet 3   dabigatran (PRADAXA) 150 MG CAPS capsule Take 150 mg by mouth 2 (two) times daily.     DULoxetine (CYMBALTA) 30 MG capsule TAKE ONE CAPSULE BY MOUTH DAILY 90 capsule 3   gabapentin (NEURONTIN) 400 MG capsule TAKE 2 CAPSULES BY MOUTH THREE TIMES A DAY 180 capsule 7   hydrochlorothiazide (MICROZIDE) 12.5 MG capsule TAKE 1 CAPSULE (12.5 MG TOTAL) BY MOUTH DAILY. 90 capsule 2   losartan (COZAAR) 100 MG tablet TAKE 1 TABLET (100 MG TOTAL) BY MOUTH DAILY. 90 tablet 3   metFORMIN (GLUCOPHAGE) 500 MG tablet TAKE 1 TABLET BY MOUTH ONCE A DAY WITH BREAKFAST 90 tablet 3   mupirocin cream (BACTROBAN) 2 % APPLY TOPICALLY 2 (TWO) TIMES DAILY. 15 g 0   nitroGLYCERIN (NITROSTAT) 0.4 MG SL tablet PLACE ONE TABLET (0.4 MG DOSE) UNDER THE TONGUE EVERY 5 (FIVE) MINUTES AS NEEDED. 25 tablet 5   nortriptyline (PAMELOR) 25 MG capsule TAKE 1 CAPSULE BY MOUTH EVERY NIGHT AT BEDTIME 90 capsule 0   pantoprazole (PROTONIX) 20 MG tablet TAKE 1 TABLET BY MOUTH DAILY 90 tablet 2   prochlorperazine (COMPAZINE) 10 MG tablet Take 1 tablet (10 mg total) by mouth every 6 (six) hours as needed for nausea or vomiting. 30 tablet 0   SYMBICORT 160-4.5 MCG/ACT inhaler INHALE 2 PUFFS INTO THE LUNGS TWICE DAILY 10.2 g 11   tamsulosin (FLOMAX) 0.4 MG CAPS capsule TAKE 1 CAPSULE BY MOUTH DAILY 90 capsule 3   temazepam (RESTORIL) 15 MG capsule Take by mouth.     Tiotropium Bromide Monohydrate (SPIRIVA RESPIMAT) 2.5 MCG/ACT AERS Inhale two puffs into the lungs as needed. 4 g 11   No current facility-administered medications for this visit.    SURGICAL HISTORY:  Past Surgical History:  Procedure Laterality Date   ABDOMINAL ANGIOGRAM  09/04/2012   Procedure: ABDOMINAL  ANGIOGRAM;  Surgeon: Laverda Page, MD;  Location: [facility] CATH LAB;  Service: Cardiovascular;;   CARDIAC CATHETERIZATION  05-02-2011  DR Johnsie Cancel   MILD NONOBSTRUCTIVE CAD/ LAD, OM , AND D1/ EF 60^   CARDIAC CATHETERIZATION  AUG 2000   NON-OBSTRUTIVE CAD/ EF 68%/ 25% PROXIMAL LAD/ 20% MID CIRCUMFLEX   CARDIAC CATHETERIZATION [name] 12/23/2015   Procedure: Left Heart Cath and Coronary Angiography;  Surgeon: Adrian Prows, MD;  Location: [facility] CV LAB;  Service: Cardiovascular;  Laterality: [name];   CARDIAC CATHETERIZATION [name] 12/24/2015   Procedure: Coronary Stent Intervention;  Surgeon: Adrian Prows, MD;  Location: [facility] CV LAB;  Service: Cardiovascular;  Laterality: [name];   CORONARY ANGIOPLASTY     CORONARY STENT PLACEMENT  12/24/2015    PTCA and stenting of the distal RCA    HYDROCELE EXCISION Left 04/24/2012   Procedure: HYDROCELECTOMY ADULT;  Surgeon: Fredricka Bonine, MD;  Location: [facility];  Service: Urology;  Laterality: Left;  90 mins req for this case      [place]   LEFT AND RIGHT HEART CATHETERIZATION WITH CORONARY ANGIOGRAM [name] 09/04/2012   Procedure: LEFT AND RIGHT HEART CATHETERIZATION WITH CORONARY ANGIOGRAM;  Surgeon: Laverda Page, MD;  Location: [facility] CATH LAB;  Service: Cardiovascular;  Laterality: [name];   LEFT HEART CATHETERIZATION WITH CORONARY ANGIOGRAM [name] 05/02/2011   Procedure: LEFT HEART CATHETERIZATION WITH CORONARY ANGIOGRAM;  Surgeon: Josue Hector, MD;  Location: [facility] CATH LAB;  Service: Cardiovascular;  Laterality: [name];   THORACOTOMY/LOBECTOMY Right 03-06-2008   AND STAPLING OF BLEBS FOR SPONTANOUS PNEUMOTHORAX   TRANSTHORACIC ECHOCARDIOGRAM  02-17-2011   MODERATE LVH/ LVSF NORMAL/ EF 09-32%/ GRADE I DIASTOLIC DYSFUNCTION    REVIEW OF SYSTEMS:  A comprehensive review of systems was negative except for: Constitutional: positive for fatigue Respiratory: positive for cough and dyspnea on exertion   PHYSICAL EXAMINATION:  General appearance: alert, cooperative, fatigued, and no distress Head: Normocephalic, without obvious abnormality, atraumatic Neck: no adenopathy, no JVD, supple, symmetrical, trachea midline, and thyroid not enlarged, symmetric, no tenderness/mass/nodules Lymph nodes: Cervical, supraclavicular, and axillary nodes normal. Resp: wheezes bilaterally Back: symmetric, no curvature. ROM normal. No CVA tenderness. Cardio: regular rate and rhythm, S1, S2 normal, no murmur, click, rub or gallop GI: soft, non-tender; bowel sounds normal; no masses,  no organomegaly Extremities: extremities normal, atraumatic, no cyanosis or edema  ECOG PERFORMANCE STATUS: 1 - Symptomatic but completely ambulatory  Blood pressure 109/64, pulse (!) 59, temperature 98.4 F (36.9 C), temperature source Tympanic, resp. rate 20, height 6' (1.829 m), weight 198 lb 11.2 oz (90.1 kg), SpO2 95 %.  LABORATORY DATA: Lab Results  Component Value Date   WBC 6.5 08/17/2020   HGB 12.8 (L) 08/17/2020   HCT 38.1 (L) 08/17/2020   MCV 84.9 08/17/2020   PLT 309 08/17/2020      Chemistry      Component Value Date/Time   NA 136 08/17/2020 1011   K 3.2 (L) 08/17/2020 1011   CL 98 08/17/2020 1011   CO2 29 08/17/2020 1011   BUN 10 08/17/2020 1011   CREATININE 1.02 08/17/2020 1011      Component Value Date/Time   CALCIUM 9.4 08/17/2020 1011   ALKPHOS 79 08/17/2020 1011   AST 9 (L) 08/17/2020 1011   ALT 9 08/17/2020 1011   BILITOT 0.2 (L) 08/17/2020 1011       RADIOGRAPHIC STUDIES: No results found.   ASSESSMENT AND PLAN: This is a very pleasant 70 years old white male recently diagnosed with limited stage small cell lung cancer (T3, N2, M0) presented with pleural-based pulmonary nodules in the right lung in addition to mediastinal lymphadenopathy in August 2021.  The patient was initially diagnosed with a pleural-based nodule status post SBRT in March 2021. He completed systemic chemotherapy with carboplatin for AUC  of 5 from day 1 and to etoposide 100 mg/M2 on days 1, 2 and 3 with Neulasta support every 3 weeks.  Status post 4 cycles. The patient tolerated his treatment well except for fatigue. He also underwent palliative radiotherapy to enlarging right lung nodule under the care of Dr. Sondra Come. The patient is  currently on observation.  He had repeat CT scan of the chest performed recently. I personally and independently reviewed the scan images and discussed the result and showed the images to the patient and his wife today. His scan showed new and enlarging bilateral pulmonary nodules and the enlarging right hilar lymph nodes consistent with metastatic disease. The patient is currently undergoing palliative systemic chemotherapy with carboplatin for AUC of 5 on day 1, etoposide 100 Mg/M2 on days 1, 2 and 3 with Cosela before the chemotherapy as well as Imfinzi 1500 Mg IV on day 1 every 3 weeks with the chemotherapy followed by maintenance every 4 weeks.  Status post 1 cycle.  He tolerated the first cycle of this treatment well except for the increasing fatigue. I recommended for him to proceed with cycle #2 today as planned. I will arrange for the patient to have repeat CT scan of the chest, abdomen pelvis in 3 weeks before starting cycle #3.  If the patient has any evidence for disease progression on the upcoming scan, I will discuss with him palliative care and hospice versus second line treatment options. He was advised to call immediately if he has any concerning symptoms in the interval. The patient voices understanding of current disease status and treatment options and is in agreement with the current care plan.  All questions were answered. The patient knows to call the clinic with any problems, questions or concerns. We can certainly see the patient much sooner if necessary.  Disclaimer: This note was dictated with voice recognition software. Similar sounding words can inadvertently be transcribed and  may not be corrected upon review.  ADDENDUM: During his infusion with Cosela later today the patient developed phlebitis and this treatment was discontinued.  He will continue his current chemotherapy as planned.  He declined to consider future treatment with Cosela.

## 2020-08-17 NOTE — Patient Instructions (Signed)
[facility]  Discharge Instructions: Thank you for choosing [facility] to provide your oncology and hematology care.   If you have a lab appointment with the [facility], please go directly to the [facility] and check in at the registration area.   Wear comfortable clothing and clothing appropriate for easy access to any Portacath or PICC line.   We strive to give you quality time with your provider. You may need to reschedule your appointment if you arrive late (15 or more minutes).  Arriving late affects you and other patients whose appointments are after yours.  Also, if you miss three or more appointments without notifying the office, you may be dismissed from the clinic at the provider's discretion.      For prescription refill requests, have your pharmacy contact our office and allow 72 hours for refills to be completed.    Today you received the following chemotherapy and/or immunotherapy agents cosela, imfinzi, carboplatin, etoposide      To help prevent nausea and vomiting after your treatment, we encourage you to take your nausea medication as directed.  BELOW ARE SYMPTOMS THAT SHOULD BE REPORTED IMMEDIATELY: *FEVER GREATER THAN 100.4 F (38 C) OR HIGHER *CHILLS OR SWEATING *NAUSEA AND VOMITING THAT IS NOT CONTROLLED WITH YOUR NAUSEA MEDICATION *UNUSUAL SHORTNESS OF BREATH *UNUSUAL BRUISING OR BLEEDING *URINARY PROBLEMS (pain or burning when urinating, or frequent urination) *BOWEL PROBLEMS (unusual diarrhea, constipation, pain near the anus) TENDERNESS IN MOUTH AND THROAT WITH OR WITHOUT PRESENCE OF ULCERS (sore throat, sores in mouth, or a toothache) UNUSUAL RASH, SWELLING OR PAIN  UNUSUAL VAGINAL DISCHARGE OR ITCHING   Items with * indicate a potential emergency and should be followed up as soon as possible or go to the Emergency Department if any problems should occur.  Please show the CHEMOTHERAPY ALERT CARD or  IMMUNOTHERAPY ALERT CARD at check-in to the Emergency Department and triage nurse.  Should you have questions after your visit or need to cancel or reschedule your appointment, please contact [facility]  Dept: [phone]  and follow the prompts.  Office hours are 8:00 a.m. to 4:30 p.m. Monday - Friday. Please note that voicemails left after 4:00 p.m. may not be returned until the following business day.  We are closed weekends and major holidays. You have access to a nurse at all times for urgent questions. Please call the main number to the clinic Dept: [phone] and follow the prompts.   For any non-urgent questions, you may also contact your provider using MyChart. We now offer e-Visits for anyone 71 and older to request care online for non-urgent symptoms. For details visit [email or web address].   Also download the MyChart app! Go to the app store, search "MyChart", open the app, select [facility], and log in with your MyChart username and password.  Due to Covid, a mask is required upon entering the hospital/clinic. If you do not have a mask, one will be given to you upon arrival. For doctor visits, patients may have 1 support person aged 60 or older with them. For treatment visits, patients cannot have anyone with them due to current Covid guidelines and our immunocompromised population.

## 2020-08-17 NOTE — Patient Instructions (Signed)
Steps to Quit Smoking Smoking tobacco is the leading cause of preventable death. It can affect almost every organ in the body. Smoking puts you and people around you at risk for many serious, long-lasting (chronic) diseases. Quitting smoking can be hard, but it is one of the best things that you can do for your health. It is never too late to quit. How do I get ready to quit? When you decide to quit smoking, make a plan to help you succeed. Before you quit: Pick a date to quit. Set a date within the next 2 weeks to give you time to prepare. Write down the reasons why you are quitting. Keep this list in places where you will see it often. Tell your family, friends, and co-workers that you are quitting. Their support is important. Talk with your doctor about the choices that may help you quit. Find out if your health insurance will pay for these treatments. Know the people, places, things, and activities that make you want to smoke (triggers). Avoid them. What first steps can I take to quit smoking? Throw away all cigarettes at home, at work, and in your car. Throw away the things that you use when you smoke, such as ashtrays and lighters. Clean your car. Make sure to empty the ashtray. Clean your home, including curtains and carpets. What can I do to help me quit smoking? Talk with your doctor about taking medicines and seeing a counselor at the same time. You are more likely to succeed when you do both. If you are pregnant or breastfeeding, talk with your doctor about counseling or other ways to quit smoking. Do not take medicine to help you quit smoking unless your doctor tells you to do so. To quit smoking: Quit right away Quit smoking totally, instead of slowly cutting back on how much you smoke over a period of time. Go to counseling. You are more likely to quit if you go to counseling sessions regularly. Take medicine You may take medicines to help you quit. Some medicines need a  prescription, and some you can buy over-the-counter. Some medicines may contain a drug called nicotine to replace the nicotine in cigarettes. Medicines may: Help you to stop having the desire to smoke (cravings). Help to stop the problems that come when you stop smoking (withdrawal symptoms). Your doctor may ask you to use: Nicotine patches, gum, or lozenges. Nicotine inhalers or sprays. Non-nicotine medicine that is taken by mouth. Find resources Find resources and other ways to help you quit smoking and remain smoke-free after you quit. These resources are most helpful when you use them often. They include: Online chats with a counselor. Phone quitlines. Printed self-help materials. Support groups or group counseling. Text messaging programs. Mobile phone apps. Use apps on your mobile phone or tablet that can help you stick to your quit plan. There are many free apps for mobile phones and tablets as well as websites. Examples include Quit Guide from the CDC and smokefree.gov  What things can I do to make it easier to quit?  Talk to your family and friends. Ask them to support and encourage you. Call a phone quitline (1-800-QUIT-NOW), reach out to support groups, or work with a counselor. Ask people who smoke to not smoke around you. Avoid places that make you want to smoke, such as: Bars. Parties. Smoke-break areas at work. Spend time with people who do not smoke. Lower the stress in your life. Stress can make you want to   smoke. Try these things to help your stress: Getting regular exercise. Doing deep-breathing exercises. Doing yoga. Meditating. Doing a body scan. To do this, close your eyes, focus on one area of your body at a time from head to toe. Notice which parts of your body are tense. Try to relax the muscles in those areas. How will I feel when I quit smoking? Day 1 to 3 weeks Within the first 24 hours, you may start to have some problems that come from quitting tobacco.  These problems are very bad 2-3 days after you quit, but they do not often last for more than 2-3 weeks. You may get these symptoms: Mood swings. Feeling restless, nervous, angry, or annoyed. Trouble concentrating. Dizziness. Strong desire for high-sugar foods and nicotine. Weight gain. Trouble pooping (constipation). Feeling like you may vomit (nausea). Coughing or a sore throat. Changes in how the medicines that you take for other issues work in your body. Depression. Trouble sleeping (insomnia). Week 3 and afterward After the first 2-3 weeks of quitting, you may start to notice more positive results, such as: Better sense of smell and taste. Less coughing and sore throat. Slower heart rate. Lower blood pressure. Clearer skin. Better breathing. Fewer sick days. Quitting smoking can be hard. Do not give up if you fail the first time. Some people need to try a few times before they succeed. Do your best to stick to your quit plan, and talk with your doctor if you have any questions or concerns. Summary Smoking tobacco is the leading cause of preventable death. Quitting smoking can be hard, but it is one of the best things that you can do for your health. When you decide to quit smoking, make a plan to help you succeed. Quit smoking right away, not slowly over a period of time. When you start quitting, seek help from your doctor, family, or friends. This information is not intended to replace advice given to you by your health care provider. Make sure you discuss any questions you have with your health care provider. Document Revised: 09/28/2018 Document Reviewed: 03/24/2018 Elsevier Patient Education  2022 [employer]vier Inc.  

## 2020-08-17 NOTE — Progress Notes (Signed)
Per Dr. Julien Nordmann, ok to proceed with chemo without full infusion of cosela.

## 2020-08-18 ENCOUNTER — Other Ambulatory Visit: Payer: Self-pay

## 2020-08-18 ENCOUNTER — Other Ambulatory Visit: Payer: Self-pay | Admitting: Internal Medicine

## 2020-08-18 ENCOUNTER — Inpatient Hospital Stay: Payer: Medicare Other

## 2020-08-18 ENCOUNTER — Other Ambulatory Visit: Payer: Self-pay | Admitting: Physician Assistant

## 2020-08-18 ENCOUNTER — Other Ambulatory Visit (HOSPITAL_COMMUNITY): Payer: Self-pay

## 2020-08-18 VITALS — BP 129/75 | HR 62 | Temp 98.6°F | Resp 20

## 2020-08-18 DIAGNOSIS — Z5111 Encounter for antineoplastic chemotherapy: Secondary | ICD-10-CM | POA: Diagnosis not present

## 2020-08-18 DIAGNOSIS — C3491 Malignant neoplasm of unspecified part of right bronchus or lung: Secondary | ICD-10-CM

## 2020-08-18 MED ORDER — SODIUM CHLORIDE 0.9 % IV SOLN
Freq: Once | INTRAVENOUS | Status: AC
  Filled 2020-08-18: qty 250

## 2020-08-18 MED ORDER — LIDOCAINE-PRILOCAINE 2.5-2.5 % EX CREA
1.0000 "application " | TOPICAL_CREAM | CUTANEOUS | 2 refills | Status: DC | PRN
  Filled 2020-08-18: qty 30, 30d supply, fill #0

## 2020-08-18 MED ORDER — SODIUM CHLORIDE 0.9 % IV SOLN
10.0000 mg | Freq: Once | INTRAVENOUS | Status: AC
  Administered 2020-08-18: 10 mg via INTRAVENOUS
  Filled 2020-08-18: qty 10

## 2020-08-18 MED ORDER — AMOXICILLIN-POT CLAVULANATE 875-125 MG PO TABS
1.0000 | ORAL_TABLET | Freq: Two times a day (BID) | ORAL | 0 refills | Status: DC
  Filled 2020-08-18: qty 10, 5d supply, fill #0

## 2020-08-18 MED ORDER — SODIUM CHLORIDE 0.9 % IV SOLN
100.0000 mg/m2 | Freq: Once | INTRAVENOUS | Status: AC
  Administered 2020-08-18: 220 mg via INTRAVENOUS
  Filled 2020-08-18: qty 11

## 2020-08-18 NOTE — Patient Instructions (Signed)
[facility]  Discharge Instructions: Thank you for choosing [facility] to provide your oncology and hematology care.   If you have a lab appointment with the Cancer Center, please go directly to the Cancer Center and check in at the registration area.   Wear comfortable clothing and clothing appropriate for easy access to any Portacath or PICC line.   We strive to give you quality time with your provider. You may need to reschedule your appointment if you arrive late (15 or more minutes).  Arriving late affects you and other patients whose appointments are after yours.  Also, if you miss three or more appointments without notifying the office, you may be dismissed from the clinic at the provider's discretion.      For prescription refill requests, have your pharmacy contact our office and allow 72 hours for refills to be completed.    Today you received the following chemotherapy and/or immunotherapy agents: etoposide.     To help prevent nausea and vomiting after your treatment, we encourage you to take your nausea medication as directed.  BELOW ARE SYMPTOMS THAT SHOULD BE REPORTED IMMEDIATELY: . *FEVER GREATER THAN 100.4 F (38 C) OR HIGHER . *CHILLS OR SWEATING . *NAUSEA AND VOMITING THAT IS NOT CONTROLLED WITH YOUR NAUSEA MEDICATION . *UNUSUAL SHORTNESS OF BREATH . *UNUSUAL BRUISING OR BLEEDING . *URINARY PROBLEMS (pain or burning when urinating, or frequent urination) . *BOWEL PROBLEMS (unusual diarrhea, constipation, pain near the anus) . TENDERNESS IN MOUTH AND THROAT WITH OR WITHOUT PRESENCE OF ULCERS (sore throat, sores in mouth, or a toothache) . UNUSUAL RASH, SWELLING OR PAIN  . UNUSUAL VAGINAL DISCHARGE OR ITCHING   Items with * indicate a potential emergency and should be followed up as soon as possible or go to the Emergency Department if any problems should occur.  Please show the CHEMOTHERAPY ALERT CARD or IMMUNOTHERAPY ALERT  CARD at check-in to the Emergency Department and triage nurse.  Should you have questions after your visit or need to cancel or reschedule your appointment, please contact [facility]  Dept: [phone]  and follow the prompts.  Office hours are 8:00 a.m. to 4:30 p.m. Monday - Friday. Please note that voicemails left after 4:00 p.m. may not be returned until the following business day.  We are closed weekends and major holidays. You have access to a nurse at all times for urgent questions. Please call the main number to the clinic Dept: [phone] and follow the prompts.   For any non-urgent questions, you may also contact your provider using MyChart. We now offer e-Visits for anyone 18 and older to request care online for non-urgent symptoms. For details visit [email or web address].   Also download the MyChart app! Go to the app store, search "MyChart", open the app, select [facility], and log in with your MyChart username and password.  Due to Covid, a mask is required upon entering the hospital/clinic. If you do not have a mask, one will be given to you upon arrival. For doctor visits, patients may have 1 support person aged 18 or older with them. For treatment visits, patients cannot have anyone with them due to current Covid guidelines and our immunocompromised population.   

## 2020-08-19 ENCOUNTER — Inpatient Hospital Stay: Payer: Medicare Other

## 2020-08-19 ENCOUNTER — Other Ambulatory Visit (HOSPITAL_COMMUNITY): Payer: Self-pay

## 2020-08-19 VITALS — BP 154/74 | HR 63 | Temp 97.9°F | Resp 20

## 2020-08-19 DIAGNOSIS — Z5111 Encounter for antineoplastic chemotherapy: Secondary | ICD-10-CM | POA: Diagnosis not present

## 2020-08-19 DIAGNOSIS — C3491 Malignant neoplasm of unspecified part of right bronchus or lung: Secondary | ICD-10-CM

## 2020-08-19 MED ORDER — SODIUM CHLORIDE 0.9 % IV SOLN
10.0000 mg | Freq: Once | INTRAVENOUS | Status: AC
  Administered 2020-08-19: 10 mg via INTRAVENOUS
  Filled 2020-08-19: qty 10

## 2020-08-19 MED ORDER — SODIUM CHLORIDE 0.9 % IV SOLN
100.0000 mg/m2 | Freq: Once | INTRAVENOUS | Status: AC
  Administered 2020-08-19: 220 mg via INTRAVENOUS
  Filled 2020-08-19: qty 11

## 2020-08-19 MED ORDER — SODIUM CHLORIDE 0.9 % IV SOLN
Freq: Once | INTRAVENOUS | Status: AC
  Filled 2020-08-19: qty 250

## 2020-08-19 MED FILL — Duloxetine HCl Enteric Coated Pellets Cap 30 MG (Base Eq): ORAL | 90 days supply | Qty: 90 | Fill #1 | Status: AC

## 2020-08-19 NOTE — Patient Instructions (Signed)
[facility]  Discharge Instructions: Thank you for choosing [facility] to provide your oncology and hematology care.   If you have a lab appointment with the Cancer Center, please go directly to the Cancer Center and check in at the registration area.   Wear comfortable clothing and clothing appropriate for easy access to any Portacath or PICC line.   We strive to give you quality time with your provider. You may need to reschedule your appointment if you arrive late (15 or more minutes).  Arriving late affects you and other patients whose appointments are after yours.  Also, if you miss three or more appointments without notifying the office, you may be dismissed from the clinic at the provider's discretion.      For prescription refill requests, have your pharmacy contact our office and allow 72 hours for refills to be completed.    Today you received the following chemotherapy and/or immunotherapy agents: etoposide.     To help prevent nausea and vomiting after your treatment, we encourage you to take your nausea medication as directed.  BELOW ARE SYMPTOMS THAT SHOULD BE REPORTED IMMEDIATELY: . *FEVER GREATER THAN 100.4 F (38 C) OR HIGHER . *CHILLS OR SWEATING . *NAUSEA AND VOMITING THAT IS NOT CONTROLLED WITH YOUR NAUSEA MEDICATION . *UNUSUAL SHORTNESS OF BREATH . *UNUSUAL BRUISING OR BLEEDING . *URINARY PROBLEMS (pain or burning when urinating, or frequent urination) . *BOWEL PROBLEMS (unusual diarrhea, constipation, pain near the anus) . TENDERNESS IN MOUTH AND THROAT WITH OR WITHOUT PRESENCE OF ULCERS (sore throat, sores in mouth, or a toothache) . UNUSUAL RASH, SWELLING OR PAIN  . UNUSUAL VAGINAL DISCHARGE OR ITCHING   Items with * indicate a potential emergency and should be followed up as soon as possible or go to the Emergency Department if any problems should occur.  Please show the CHEMOTHERAPY ALERT CARD or IMMUNOTHERAPY ALERT  CARD at check-in to the Emergency Department and triage nurse.  Should you have questions after your visit or need to cancel or reschedule your appointment, please contact [facility]  Dept: [phone]  and follow the prompts.  Office hours are 8:00 a.m. to 4:30 p.m. Monday - Friday. Please note that voicemails left after 4:00 p.m. may not be returned until the following business day.  We are closed weekends and major holidays. You have access to a nurse at all times for urgent questions. Please call the main number to the clinic Dept: [phone] and follow the prompts.   For any non-urgent questions, you may also contact your provider using MyChart. We now offer e-Visits for anyone 18 and older to request care online for non-urgent symptoms. For details visit [email or web address].   Also download the MyChart app! Go to the app store, search "MyChart", open the app, select , and log in with your MyChart username and password.  Due to Covid, a mask is required upon entering the hospital/clinic. If you do not have a mask, one will be given to you upon arrival. For doctor visits, patients may have 1 support person aged 18 or older with them. For treatment visits, patients cannot have anyone with them due to current Covid guidelines and our immunocompromised population.   

## 2020-08-20 ENCOUNTER — Ambulatory Visit: Payer: Medicare Other

## 2020-08-20 ENCOUNTER — Other Ambulatory Visit (HOSPITAL_COMMUNITY): Payer: Self-pay

## 2020-08-20 MED ORDER — CYCLOBENZAPRINE HCL 5 MG PO TABS
ORAL_TABLET | ORAL | 3 refills | Status: DC
  Filled 2020-08-20: qty 30, 30d supply, fill #0
  Filled 2020-09-23: qty 30, 30d supply, fill #1
  Filled 2020-10-26: qty 30, 30d supply, fill #2
  Filled 2020-11-25: qty 30, 30d supply, fill #3

## 2020-08-20 NOTE — Progress Notes (Deleted)
Called patient inquiring about his appointment this afternoon. He states is unable to come today d/t his wife being sick. He states he is able come Saturday. Patient already has an appointment for 1345 on 8/6

## 2020-08-21 ENCOUNTER — Ambulatory Visit: Payer: Medicare Other

## 2020-08-22 ENCOUNTER — Other Ambulatory Visit: Payer: Self-pay

## 2020-08-22 ENCOUNTER — Inpatient Hospital Stay: Payer: Medicare Other

## 2020-08-22 VITALS — BP 133/76 | HR 89 | Temp 98.2°F | Resp 20

## 2020-08-22 DIAGNOSIS — C3491 Malignant neoplasm of unspecified part of right bronchus or lung: Secondary | ICD-10-CM

## 2020-08-22 DIAGNOSIS — Z5111 Encounter for antineoplastic chemotherapy: Secondary | ICD-10-CM | POA: Diagnosis not present

## 2020-08-22 MED ORDER — PEGFILGRASTIM-BMEZ 6 MG/0.6ML ~~LOC~~ SOSY
PREFILLED_SYRINGE | SUBCUTANEOUS | Status: AC
  Filled 2020-08-22: qty 0.6

## 2020-08-22 MED ORDER — PEGFILGRASTIM-BMEZ 6 MG/0.6ML ~~LOC~~ SOSY
6.0000 mg | PREFILLED_SYRINGE | Freq: Once | SUBCUTANEOUS | Status: AC
  Administered 2020-08-22: 6 mg via SUBCUTANEOUS

## 2020-08-22 NOTE — Patient Instructions (Signed)

## 2020-08-24 ENCOUNTER — Other Ambulatory Visit (HOSPITAL_COMMUNITY): Payer: Self-pay

## 2020-08-24 ENCOUNTER — Other Ambulatory Visit: Payer: Self-pay

## 2020-08-24 ENCOUNTER — Inpatient Hospital Stay: Payer: Medicare Other

## 2020-08-24 DIAGNOSIS — Z5111 Encounter for antineoplastic chemotherapy: Secondary | ICD-10-CM | POA: Diagnosis not present

## 2020-08-24 DIAGNOSIS — C3491 Malignant neoplasm of unspecified part of right bronchus or lung: Secondary | ICD-10-CM

## 2020-08-24 LAB — CBC WITH DIFFERENTIAL (CANCER CENTER ONLY)
Abs Immature Granulocytes: 0 10*3/uL (ref 0.00–0.07)
Band Neutrophils: 2 %
Basophils Absolute: 0 10*3/uL (ref 0.0–0.1)
Basophils Relative: 0 %
Eosinophils Absolute: 0 10*3/uL (ref 0.0–0.5)
Eosinophils Relative: 0 %
HCT: 37.3 % — ABNORMAL LOW (ref 39.0–52.0)
Hemoglobin: 12.7 g/dL — ABNORMAL LOW (ref 13.0–17.0)
Lymphocytes Relative: 10 %
Lymphs Abs: 2.3 10*3/uL (ref 0.7–4.0)
MCH: 28.7 pg (ref 26.0–34.0)
MCHC: 34 g/dL (ref 30.0–36.0)
MCV: 84.4 fL (ref 80.0–100.0)
Monocytes Absolute: 0.2 10*3/uL (ref 0.1–1.0)
Monocytes Relative: 1 %
Neutro Abs: 20.1 10*3/uL — ABNORMAL HIGH (ref 1.7–7.7)
Neutrophils Relative %: 87 %
Platelet Count: 169 10*3/uL (ref 150–400)
RBC: 4.42 MIL/uL (ref 4.22–5.81)
RDW: 14.4 % (ref 11.5–15.5)
WBC Count: 22.6 10*3/uL — ABNORMAL HIGH (ref 4.0–10.5)
nRBC: 0 % (ref 0.0–0.2)

## 2020-08-24 LAB — CMP (CANCER CENTER ONLY)
ALT: 15 U/L (ref 0–44)
AST: 12 U/L — ABNORMAL LOW (ref 15–41)
Albumin: 3.5 g/dL (ref 3.5–5.0)
Alkaline Phosphatase: 96 U/L (ref 38–126)
Anion gap: 11 (ref 5–15)
BUN: 11 mg/dL (ref 8–23)
CO2: 30 mmol/L (ref 22–32)
Calcium: 9.5 mg/dL (ref 8.9–10.3)
Chloride: 93 mmol/L — ABNORMAL LOW (ref 98–111)
Creatinine: 0.85 mg/dL (ref 0.61–1.24)
GFR, Estimated: >60 mL/min (ref >60)
Glucose, Bld: 129 mg/dL — ABNORMAL HIGH (ref 70–99)
Potassium: 3.3 mmol/L — ABNORMAL LOW (ref 3.5–5.1)
Sodium: 134 mmol/L — ABNORMAL LOW (ref 135–145)
Total Bilirubin: 0.7 mg/dL (ref 0.3–1.2)
Total Protein: 7.3 g/dL (ref 6.5–8.1)

## 2020-08-24 LAB — TSH: TSH: 1.091 u[IU]/mL (ref 0.320–4.118)

## 2020-08-25 ENCOUNTER — Other Ambulatory Visit (HOSPITAL_COMMUNITY): Payer: Self-pay

## 2020-08-26 ENCOUNTER — Other Ambulatory Visit (HOSPITAL_COMMUNITY): Payer: Self-pay

## 2020-08-26 MED ORDER — ATORVASTATIN CALCIUM 20 MG PO TABS
ORAL_TABLET | ORAL | 5 refills | Status: DC
  Filled 2020-08-26: qty 30, 30d supply, fill #0
  Filled 2020-09-23: qty 30, 30d supply, fill #1
  Filled 2020-10-26: qty 30, 30d supply, fill #2
  Filled 2020-11-25: qty 30, 30d supply, fill #3
  Filled 2020-12-24: qty 30, 30d supply, fill #4
  Filled 2021-01-21: qty 30, 30d supply, fill #5

## 2020-08-27 ENCOUNTER — Other Ambulatory Visit (HOSPITAL_COMMUNITY): Payer: Self-pay

## 2020-08-28 ENCOUNTER — Other Ambulatory Visit (HOSPITAL_COMMUNITY): Payer: Self-pay

## 2020-08-29 ENCOUNTER — Other Ambulatory Visit (HOSPITAL_COMMUNITY): Payer: Self-pay

## 2020-08-31 ENCOUNTER — Inpatient Hospital Stay: Payer: Medicare Other

## 2020-08-31 ENCOUNTER — Other Ambulatory Visit (HOSPITAL_COMMUNITY): Payer: Self-pay

## 2020-08-31 ENCOUNTER — Other Ambulatory Visit: Payer: Self-pay

## 2020-08-31 DIAGNOSIS — C3491 Malignant neoplasm of unspecified part of right bronchus or lung: Secondary | ICD-10-CM

## 2020-08-31 DIAGNOSIS — Z5111 Encounter for antineoplastic chemotherapy: Secondary | ICD-10-CM | POA: Diagnosis not present

## 2020-08-31 LAB — CMP (CANCER CENTER ONLY)
ALT: 14 U/L (ref 0–44)
AST: 11 U/L — ABNORMAL LOW (ref 15–41)
Albumin: 3.3 g/dL — ABNORMAL LOW (ref 3.5–5.0)
Alkaline Phosphatase: 117 U/L (ref 38–126)
Anion gap: 10 (ref 5–15)
BUN: 6 mg/dL — ABNORMAL LOW (ref 8–23)
CO2: 26 mmol/L (ref 22–32)
Calcium: 8.9 mg/dL (ref 8.9–10.3)
Chloride: 99 mmol/L (ref 98–111)
Creatinine: 0.93 mg/dL (ref 0.61–1.24)
GFR, Estimated: >60 mL/min (ref >60)
Glucose, Bld: 153 mg/dL — ABNORMAL HIGH (ref 70–99)
Potassium: 3.4 mmol/L — ABNORMAL LOW (ref 3.5–5.1)
Sodium: 135 mmol/L (ref 135–145)
Total Bilirubin: 0.2 mg/dL — ABNORMAL LOW (ref 0.3–1.2)
Total Protein: 6.7 g/dL (ref 6.5–8.1)

## 2020-08-31 LAB — CBC WITH DIFFERENTIAL (CANCER CENTER ONLY)
Abs Immature Granulocytes: 1.82 10*3/uL — ABNORMAL HIGH (ref 0.00–0.07)
Basophils Absolute: 0.1 10*3/uL (ref 0.0–0.1)
Basophils Relative: 1 %
Eosinophils Absolute: 0 10*3/uL (ref 0.0–0.5)
Eosinophils Relative: 0 %
HCT: 34.5 % — ABNORMAL LOW (ref 39.0–52.0)
Hemoglobin: 11.5 g/dL — ABNORMAL LOW (ref 13.0–17.0)
Immature Granulocytes: 11 %
Lymphocytes Relative: 11 %
Lymphs Abs: 1.7 10*3/uL (ref 0.7–4.0)
MCH: 28.5 pg (ref 26.0–34.0)
MCHC: 33.3 g/dL (ref 30.0–36.0)
MCV: 85.4 fL (ref 80.0–100.0)
Monocytes Absolute: 1.9 10*3/uL — ABNORMAL HIGH (ref 0.1–1.0)
Monocytes Relative: 12 %
Neutro Abs: 10.4 10*3/uL — ABNORMAL HIGH (ref 1.7–7.7)
Neutrophils Relative %: 65 %
Platelet Count: 86 10*3/uL — ABNORMAL LOW (ref 150–400)
RBC: 4.04 MIL/uL — ABNORMAL LOW (ref 4.22–5.81)
RDW: 16 % — ABNORMAL HIGH (ref 11.5–15.5)
WBC Count: 16 10*3/uL — ABNORMAL HIGH (ref 4.0–10.5)
nRBC: 0.4 % — ABNORMAL HIGH (ref 0.0–0.2)

## 2020-08-31 MED ORDER — NORTRIPTYLINE HCL 25 MG PO CAPS
25.0000 mg | ORAL_CAPSULE | Freq: Every day | ORAL | 0 refills | Status: DC
  Filled 2020-08-31: qty 90, 90d supply, fill #0

## 2020-08-31 MED ORDER — GABAPENTIN 400 MG PO CAPS
ORAL_CAPSULE | ORAL | 7 refills | Status: DC
  Filled 2020-08-31: qty 180, 30d supply, fill #0
  Filled 2020-09-28: qty 180, 30d supply, fill #1
  Filled 2020-10-26: qty 180, 30d supply, fill #2
  Filled 2020-11-25: qty 180, 30d supply, fill #3
  Filled 2020-12-22: qty 180, 30d supply, fill #4
  Filled 2021-01-28: qty 180, 30d supply, fill #5
  Filled 2021-02-23: qty 180, 30d supply, fill #6

## 2020-09-02 ENCOUNTER — Other Ambulatory Visit (HOSPITAL_COMMUNITY): Payer: Self-pay

## 2020-09-04 ENCOUNTER — Other Ambulatory Visit: Payer: Self-pay

## 2020-09-04 ENCOUNTER — Ambulatory Visit (HOSPITAL_COMMUNITY)
Admission: RE | Admit: 2020-09-04 | Discharge: 2020-09-04 | Disposition: A | Discharge status: Home / Self Care | Payer: Medicare Other | Source: Ambulatory Visit | Attending: Internal Medicine | Admitting: Internal Medicine

## 2020-09-04 DIAGNOSIS — C3490 Malignant neoplasm of unspecified part of unspecified bronchus or lung: Secondary | ICD-10-CM | POA: Diagnosis present

## 2020-09-04 MED ORDER — IOHEXOL 350 MG/ML SOLN
80.0000 mL | Freq: Once | INTRAVENOUS | Status: AC | PRN
  Administered 2020-09-04: 80 mL via INTRAVENOUS

## 2020-09-07 ENCOUNTER — Inpatient Hospital Stay: Payer: Medicare Other

## 2020-09-07 ENCOUNTER — Other Ambulatory Visit: Payer: Self-pay | Admitting: Internal Medicine

## 2020-09-07 ENCOUNTER — Other Ambulatory Visit (HOSPITAL_COMMUNITY): Payer: Self-pay

## 2020-09-07 ENCOUNTER — Other Ambulatory Visit: Payer: Self-pay

## 2020-09-07 ENCOUNTER — Encounter: Payer: Self-pay | Admitting: Internal Medicine

## 2020-09-07 ENCOUNTER — Inpatient Hospital Stay (HOSPITAL_BASED_OUTPATIENT_CLINIC_OR_DEPARTMENT_OTHER): Payer: Medicare Other | Admitting: Internal Medicine

## 2020-09-07 VITALS — BP 128/69 | HR 64 | Temp 98.0°F | Resp 20 | Ht 72.0 in | Wt 197.8 lb

## 2020-09-07 DIAGNOSIS — C3491 Malignant neoplasm of unspecified part of right bronchus or lung: Secondary | ICD-10-CM | POA: Diagnosis not present

## 2020-09-07 DIAGNOSIS — Z5111 Encounter for antineoplastic chemotherapy: Secondary | ICD-10-CM | POA: Diagnosis not present

## 2020-09-07 DIAGNOSIS — Z5112 Encounter for antineoplastic immunotherapy: Secondary | ICD-10-CM

## 2020-09-07 LAB — CBC WITH DIFFERENTIAL (CANCER CENTER ONLY)
Abs Immature Granulocytes: 0.26 10*3/uL — ABNORMAL HIGH (ref 0.00–0.07)
Basophils Absolute: 0 10*3/uL (ref 0.0–0.1)
Basophils Relative: 0 %
Eosinophils Absolute: 0 10*3/uL (ref 0.0–0.5)
Eosinophils Relative: 0 %
HCT: 34.4 % — ABNORMAL LOW (ref 39.0–52.0)
Hemoglobin: 11.6 g/dL — ABNORMAL LOW (ref 13.0–17.0)
Immature Granulocytes: 2 %
Lymphocytes Relative: 12 %
Lymphs Abs: 1.7 10*3/uL (ref 0.7–4.0)
MCH: 28.1 pg (ref 26.0–34.0)
MCHC: 33.7 g/dL (ref 30.0–36.0)
MCV: 83.3 fL (ref 80.0–100.0)
Monocytes Absolute: 1.2 10*3/uL — ABNORMAL HIGH (ref 0.1–1.0)
Monocytes Relative: 8 %
Neutro Abs: 10.6 10*3/uL — ABNORMAL HIGH (ref 1.7–7.7)
Neutrophils Relative %: 78 %
Platelet Count: 323 10*3/uL (ref 150–400)
RBC: 4.13 MIL/uL — ABNORMAL LOW (ref 4.22–5.81)
RDW: 16.4 % — ABNORMAL HIGH (ref 11.5–15.5)
WBC Count: 13.8 10*3/uL — ABNORMAL HIGH (ref 4.0–10.5)
nRBC: 0.1 % (ref 0.0–0.2)

## 2020-09-07 LAB — CMP (CANCER CENTER ONLY)
ALT: 13 U/L (ref 0–44)
AST: 8 U/L — ABNORMAL LOW (ref 15–41)
Albumin: 3.3 g/dL — ABNORMAL LOW (ref 3.5–5.0)
Alkaline Phosphatase: 90 U/L (ref 38–126)
Anion gap: 11 (ref 5–15)
BUN: 8 mg/dL (ref 8–23)
CO2: 27 mmol/L (ref 22–32)
Calcium: 9.1 mg/dL (ref 8.9–10.3)
Chloride: 96 mmol/L — ABNORMAL LOW (ref 98–111)
Creatinine: 0.81 mg/dL (ref 0.61–1.24)
GFR, Estimated: >60 mL/min (ref >60)
Glucose, Bld: 146 mg/dL — ABNORMAL HIGH (ref 70–99)
Potassium: 3.2 mmol/L — ABNORMAL LOW (ref 3.5–5.1)
Sodium: 134 mmol/L — ABNORMAL LOW (ref 135–145)
Total Bilirubin: 0.3 mg/dL (ref 0.3–1.2)
Total Protein: 7 g/dL (ref 6.5–8.1)

## 2020-09-07 MED ORDER — SODIUM CHLORIDE 0.9 % IV SOLN
10.0000 mg | Freq: Once | INTRAVENOUS | Status: AC
  Administered 2020-09-07: 10 mg via INTRAVENOUS
  Filled 2020-09-07: qty 10

## 2020-09-07 MED ORDER — SODIUM CHLORIDE 0.9 % IV SOLN: Freq: Once | INTRAVENOUS | Status: AC

## 2020-09-07 MED ORDER — POTASSIUM CHLORIDE CRYS ER 20 MEQ PO TBCR
20.0000 meq | EXTENDED_RELEASE_TABLET | Freq: Every day | ORAL | 0 refills | Status: DC
  Filled 2020-09-07: qty 7, 7d supply, fill #0

## 2020-09-07 MED ORDER — FAMOTIDINE 20 MG IN NS 100 ML IVPB
20.0000 mg | Freq: Once | INTRAVENOUS | Status: AC
  Administered 2020-09-07: 20 mg via INTRAVENOUS
  Filled 2020-09-07: qty 100

## 2020-09-07 MED ORDER — PALONOSETRON HCL INJECTION 0.25 MG/5ML
0.2500 mg | Freq: Once | INTRAVENOUS | Status: AC
  Administered 2020-09-07: 0.25 mg via INTRAVENOUS
  Filled 2020-09-07: qty 5

## 2020-09-07 MED ORDER — SODIUM CHLORIDE 0.9 % IV SOLN
1500.0000 mg | Freq: Once | INTRAVENOUS | Status: AC
  Administered 2020-09-07: 1500 mg via INTRAVENOUS
  Filled 2020-09-07: qty 30

## 2020-09-07 MED ORDER — SODIUM CHLORIDE 0.9 % IV SOLN
100.0000 mg/m2 | Freq: Once | INTRAVENOUS | Status: AC
  Administered 2020-09-07: 220 mg via INTRAVENOUS
  Filled 2020-09-07: qty 11

## 2020-09-07 MED ORDER — SODIUM CHLORIDE 0.9 % IV SOLN
560.0000 mg | Freq: Once | INTRAVENOUS | Status: AC
  Administered 2020-09-07: 560 mg via INTRAVENOUS
  Filled 2020-09-07: qty 56

## 2020-09-07 MED ORDER — DIPHENHYDRAMINE HCL 50 MG/ML IJ SOLN
25.0000 mg | Freq: Once | INTRAMUSCULAR | Status: AC
  Administered 2020-09-07: 25 mg via INTRAVENOUS
  Filled 2020-09-07: qty 1

## 2020-09-07 MED ORDER — SODIUM CHLORIDE 0.9 % IV SOLN
150.0000 mg | Freq: Once | INTRAVENOUS | Status: AC
  Administered 2020-09-07: 150 mg via INTRAVENOUS
  Filled 2020-09-07: qty 150

## 2020-09-07 NOTE — Progress Notes (Signed)
MD aware of K 3.2.  MD to send Rx for potassium.  Pt instructed to increase potassium rich foods in his diet.  Pt verbalizes understanding.

## 2020-09-07 NOTE — Progress Notes (Signed)
[facility] Telephone:([phone]) [phone]   Fax:([phone]) [phone]  OFFICE PROGRESS NOTE  [name], MD [place] [place] [address]  DIAGNOSIS: Relapsed small cell lung cancer initially diagnosed as limited stage (T3, N2, M0) small cell lung cancer presented with pulmonary nodules and pleural-based nodules in the right lung in addition to mediastinal lymphadenopathy diagnosed in August 2021.  The patient was previously treated without biopsy to a pleural-based nodule in the right lower lobe with SBRT in March 2021 but had evidence for disease recurrence and progression.  He has disease relapse in June 2022.  PRIOR THERAPY:  1) SBRT to right lower lobe pulmonary nodule in March 2021 under the care of Dr. Sondra Come. 2) Systemic chemotherapy with carboplatin for AUC of 5 on day 1 and etoposide 100 mg/M2 on days 1, 2 and 3 with Neulasta support every 3 weeks.  First cycle 10/07/2019.  Status post 4 cycles.  Last dose of the chemotherapy was given on December 09, 2019.  CURRENT THERAPY: Second line systemic chemotherapy with carboplatin for AUC of 5 on day 1, etoposide 100 Mg/M2 on days 1, 2 and 3 with Cosela before the chemotherapy in addition to Imfinzi 1500 Mg IV on day 1 every 3 weeks.  First dose July 27, 2020.  Status post 2 cycles.  Cosela was discontinued secondary to phlebitis.  INTERVAL HISTORY: Jonathon [name] 70 y.o. male returns to the clinic today for follow-up visit accompanied by his wife.  The patient is feeling fine today with no concerning complaints.  He has thrombophlebitis from the infusion of Cosela and he decided not to continue with it anymore.  He tolerated the last cycle of his treatment well.  He denied having any current chest pain but has shortness of breath at baseline increased with exertion with no cough or hemoptysis.  He denied having any fever or chills.  He has no headache or visual changes.  He denied having any significant  weight loss or night sweats.  The patient has no nausea, vomiting, diarrhea or constipation.  He had repeat CT scan of the chest, abdomen and pelvis performed on 09/04/2020 and he is here for evaluation before starting cycle #3 of his treatment.   MEDICAL HISTORY: Past Medical History:  Diagnosis Date   Cancer ([facility])    CHF (congestive heart failure) (HCC)    Chronic cough    COPD, severe (HCC)    PT DENIES REGULAR DAILY INHALER ANY PRN   Coronary artery disease    CVA (cerebral vascular accident) ([facility]) 02/22/2011   LEFT BRAIN STEM PONTINE HEMORRHAGE--  RIGHT SIDED WEAKNESS   Diabetes mellitus without complication (HCC)    type 2   Dyspnea    GERD (gastroesophageal reflux disease)    Harsh voice quality    PT STATES NORMAL FOR HIM   High cholesterol    History of CHF (congestive heart failure) MONITORED BY DR Coletta Memos   DIASTOLIC   History of radiation therapy 03/13/19-03/20/19   SBRT Right Lung; Dr. Gery Pray   History of radiation therapy 02/17/20-02/28/20   Whole brain radiation; Dr. Gery Pray   History of radiation therapy 05/25/2020   05/18/2020-05/25/2020  SBRT Right Lung; Dr. Gery Pray   Hydrocele, left    Hypertension    NSTEMI (non-ST elevated myocardial infarction) ([facility]) 12/2015   Pre-operative clearance    GIVEN BY DR Coletta Memos   Short of breath on exertion    Smoker  Weakness of right side of body    S/P CVA   FEB 2013    ALLERGIES:  is allergic to atorvastatin.  MEDICATIONS:  Current Outpatient Medications  Medication Sig Dispense Refill   albuterol (PROVENTIL) (2.5 MG/3ML) 0.083% nebulizer solution Take 2.5 mg by nebulization every 4 (four) hours as needed for wheezing or shortness of breath.     Albuterol Sulfate (PROAIR RESPICLICK) 967 (90 BASE) MCG/ACT AEPB Inhale 2 puffs into the lungs 4 (four) times daily as needed. 1 each 5   amLODipine (NORVASC) 10 MG tablet TAKE 1 TABLET BY MOUTH ONCE A DAY 90 tablet 3   amoxicillin-clavulanate (AUGMENTIN)  875-125 MG tablet Take 1 tablet by mouth 2 times daily. 10 tablet 0   atorvastatin (LIPITOR) 20 MG tablet Take 20 mg by mouth daily.     atorvastatin (LIPITOR) 20 MG tablet Take one tablet (20 mg dose) by mouth daily. 30 tablet 5   bisoprolol (ZEBETA) 5 MG tablet TAKE ONE (1) TABLET BY MOUTH EVERY DAY 90 tablet 3   cloNIDine (CATAPRES) 0.3 MG tablet TAKE 1 TABLET BY MOUTH TWICE DAILY 180 tablet 3   Cyanocobalamin (VITAMIN B 12 PO) Take 1 tablet by mouth every morning.      cyclobenzaprine (FLEXERIL) 5 MG tablet TAKE 1 TABLET BY MOUTH AT BEDTIME AS NEEDED FOR MUSCLE SPASMS 30 tablet 3   dabigatran (PRADAXA) 150 MG CAPS capsule Take 150 mg by mouth 2 (two) times daily.     DULoxetine (CYMBALTA) 30 MG capsule TAKE ONE CAPSULE BY MOUTH DAILY 90 capsule 3   gabapentin (NEURONTIN) 400 MG capsule TAKE 2 CAPSULES BY MOUTH THREE TIMES A DAY 180 capsule 7   hydrochlorothiazide (MICROZIDE) 12.5 MG capsule TAKE 1 CAPSULE (12.5 MG TOTAL) BY MOUTH DAILY. 90 capsule 2   lidocaine-prilocaine (EMLA) cream Apply 1 application topically as needed to port-a-cath site. 30 g 2   losartan (COZAAR) 100 MG tablet TAKE 1 TABLET (100 MG TOTAL) BY MOUTH DAILY. 90 tablet 3   metFORMIN (GLUCOPHAGE) 500 MG tablet TAKE 1 TABLET BY MOUTH ONCE A DAY WITH BREAKFAST 90 tablet 3   mupirocin cream (BACTROBAN) 2 % APPLY TOPICALLY 2 (TWO) TIMES DAILY. 15 g 0   nitroGLYCERIN (NITROSTAT) 0.4 MG SL tablet PLACE ONE TABLET (0.4 MG DOSE) UNDER THE TONGUE EVERY 5 (FIVE) MINUTES AS NEEDED. 25 tablet 5   nortriptyline (PAMELOR) 25 MG capsule TAKE 1 CAPSULE BY MOUTH EVERY NIGHT AT BEDTIME 90 capsule 0   pantoprazole (PROTONIX) 20 MG tablet TAKE 1 TABLET BY MOUTH DAILY 90 tablet 2   prochlorperazine (COMPAZINE) 10 MG tablet Take 1 tablet (10 mg total) by mouth every 6 (six) hours as needed for nausea or vomiting. 30 tablet 0   SYMBICORT 160-4.5 MCG/ACT inhaler INHALE 2 PUFFS INTO THE LUNGS TWICE DAILY 10.2 g 11   tamsulosin (FLOMAX) 0.4 MG CAPS  capsule TAKE 1 CAPSULE BY MOUTH DAILY 90 capsule 3   temazepam (RESTORIL) 15 MG capsule Take by mouth.     Tiotropium Bromide Monohydrate (SPIRIVA RESPIMAT) 2.5 MCG/ACT AERS Inhale two puffs into the lungs as needed. 4 g 11   No current facility-administered medications for this visit.    SURGICAL HISTORY:  Past Surgical History:  Procedure Laterality Date   ABDOMINAL ANGIOGRAM  09/04/2012   Procedure: ABDOMINAL ANGIOGRAM;  Surgeon: Laverda Page, MD;  Location: [facility] CATH LAB;  Service: Cardiovascular;;   CARDIAC CATHETERIZATION  05-02-2011  DR Johnsie Cancel   MILD NONOBSTRUCTIVE CAD/ LAD, OM ,  AND D1/ EF 60^   CARDIAC CATHETERIZATION  AUG 2000   NON-OBSTRUTIVE CAD/ EF 68%/ 25% PROXIMAL LAD/ 20% MID CIRCUMFLEX   CARDIAC CATHETERIZATION [name] 12/23/2015   Procedure: Left Heart Cath and Coronary Angiography;  Surgeon: Adrian Prows, MD;  Location: [facility] CV LAB;  Service: Cardiovascular;  Laterality: [name];   CARDIAC CATHETERIZATION [name] 12/24/2015   Procedure: Coronary Stent Intervention;  Surgeon: Adrian Prows, MD;  Location: [facility];  Service: Cardiovascular;  Laterality: [name];   CORONARY ANGIOPLASTY     CORONARY STENT PLACEMENT  12/24/2015    PTCA and stenting of the distal RCA    HYDROCELE EXCISION Left 04/24/2012   Procedure: HYDROCELECTOMY ADULT;  Surgeon: Fredricka Bonine, MD;  Location: [facility];  Service: Urology;  Laterality: Left;  90 mins req for this case      [facility]   LEFT AND RIGHT HEART CATHETERIZATION WITH CORONARY ANGIOGRAM [name] 09/04/2012   Procedure: LEFT AND RIGHT HEART CATHETERIZATION WITH CORONARY ANGIOGRAM;  Surgeon: Laverda Page, MD;  Location: [facility] CATH LAB;  Service: Cardiovascular;  Laterality: [name];   LEFT HEART CATHETERIZATION WITH CORONARY ANGIOGRAM [name] 05/02/2011   Procedure: LEFT HEART CATHETERIZATION WITH CORONARY ANGIOGRAM;  Surgeon: Josue Hector, MD;  Location: [facility] CATH LAB;  Service: Cardiovascular;   Laterality: [name];   THORACOTOMY/LOBECTOMY Right 03-06-2008   AND STAPLING OF BLEBS FOR SPONTANOUS PNEUMOTHORAX   TRANSTHORACIC ECHOCARDIOGRAM  02-17-2011   MODERATE LVH/ LVSF NORMAL/ EF 40-98%/ GRADE I DIASTOLIC DYSFUNCTION    REVIEW OF SYSTEMS:  Constitutional: positive for fatigue Eyes: negative Ears, nose, mouth, throat, and face: negative Respiratory: positive for dyspnea on exertion Cardiovascular: negative Gastrointestinal: negative Genitourinary:negative Integument/breast: negative Hematologic/lymphatic: negative Musculoskeletal:negative Neurological: negative Behavioral/Psych: negative Endocrine: negative Allergic/Immunologic: negative   PHYSICAL EXAMINATION: General appearance: alert, cooperative, fatigued, and no distress Head: Normocephalic, without obvious abnormality, atraumatic Neck: no adenopathy, no JVD, supple, symmetrical, trachea midline, and thyroid not enlarged, symmetric, no tenderness/mass/nodules Lymph nodes: Cervical, supraclavicular, and axillary nodes normal. Resp: wheezes bilaterally Back: symmetric, no curvature. ROM normal. No CVA tenderness. Cardio: regular rate and rhythm, S1, S2 normal, no murmur, click, rub or gallop GI: soft, non-tender; bowel sounds normal; no masses,  no organomegaly Extremities: extremities normal, atraumatic, no cyanosis or edema Neurologic: Alert and oriented X 3, normal strength and tone. Normal symmetric reflexes. Normal coordination and gait  ECOG PERFORMANCE STATUS: 1 - Symptomatic but completely ambulatory  Blood pressure 128/69, pulse 64, temperature 98 F (36.7 C), temperature source Tympanic, resp. rate 20, height 6' (1.829 m), weight 197 lb 12.8 oz (89.7 kg), SpO2 97 %.  LABORATORY DATA: Lab Results  Component Value Date   WBC 13.8 (H) 09/07/2020   HGB 11.6 (L) 09/07/2020   HCT 34.4 (L) 09/07/2020   MCV 83.3 09/07/2020   PLT 323 09/07/2020      Chemistry      Component Value Date/Time   NA 135  08/31/2020 1026   K 3.4 (L) 08/31/2020 1026   CL 99 08/31/2020 1026   CO2 26 08/31/2020 1026   BUN 6 (L) 08/31/2020 1026   CREATININE 0.93 08/31/2020 1026      Component Value Date/Time   CALCIUM 8.9 08/31/2020 1026   ALKPHOS 117 08/31/2020 1026   AST 11 (L) 08/31/2020 1026   ALT 14 08/31/2020 1026   BILITOT 0.2 (L) 08/31/2020 1026       RADIOGRAPHIC STUDIES: No results found.   ASSESSMENT AND PLAN: This is  a very pleasant 70 years old white male recently diagnosed with limited stage small cell lung cancer (T3, N2, M0) presented with pleural-based pulmonary nodules in the right lung in addition to mediastinal lymphadenopathy in August 2021.  The patient was initially diagnosed with a pleural-based nodule status post SBRT in March 2021. He completed systemic chemotherapy with carboplatin for AUC of 5 from day 1 and to etoposide 100 mg/M2 on days 1, 2 and 3 with Neulasta support every 3 weeks.  Status post 4 cycles. The patient tolerated his treatment well except for fatigue. He also underwent palliative radiotherapy to enlarging right lung nodule under the care of Dr. Sondra Come. The patient is currently on observation.  He had repeat CT scan of the chest performed recently. I personally and independently reviewed the scan images and discussed the result and showed the images to the patient and his wife today. His scan showed new and enlarging bilateral pulmonary nodules and the enlarging right hilar lymph nodes consistent with metastatic disease. The patient is currently undergoing palliative systemic chemotherapy with carboplatin for AUC of 5 on day 1, etoposide 100 Mg/M2 on days 1, 2 and 3 with Cosela before the chemotherapy as well as Imfinzi 1500 Mg IV on day 1 every 3 weeks with the chemotherapy followed by maintenance every 4 weeks.  Status post 2 cycles.  Cosela was discontinued after cycle #1 secondary to phlebitis and the patient refused to continue with the treatment. The patient  tolerated the last cycle of his treatment well. He had repeat CT scan of the chest, abdomen pelvis performed on 09/04/2020 but the final report is still pending.  I personally and independently reviewed the scan images and discussed the results with the patient.  The images showed improvement of his disease but I would wait for the final report for confirmation. I recommended for the patient to proceed with cycle #3 today as planned. I will see him back for follow-up visit in 3 weeks for evaluation before the next cycle of his treatment. For the IV access, I will arrange for the patient to have a Port-A-Cath placed before the next cycle of his treatment. The patient was advised to call immediately if he has any concerning symptoms in the interval. The patient voices understanding of current disease status and treatment options and is in agreement with the current care plan.  All questions were answered. The patient knows to call the clinic with any problems, questions or concerns. We can certainly see the patient much sooner if necessary.  Disclaimer: This note was dictated with voice recognition software. Similar sounding words can inadvertently be transcribed and may not be corrected upon review.

## 2020-09-07 NOTE — Patient Instructions (Addendum)
[facility]  Discharge Instructions: Thank you for choosing [facility] to provide your oncology and hematology care.   If you have a lab appointment with the [facility], please go directly to the [facility] and check in at the registration area.   Wear comfortable clothing and clothing appropriate for easy access to any Portacath or PICC line.   We strive to give you quality time with your provider. You may need to reschedule your appointment if you arrive late (15 or more minutes).  Arriving late affects you and other patients whose appointments are after yours.  Also, if you miss three or more appointments without notifying the office, you may be dismissed from the clinic at the provider's discretion.      For prescription refill requests, have your pharmacy contact our office and allow 72 hours for refills to be completed.    Today you received the following chemotherapy and/or immunotherapy agents Durvalumab; carboplatin, and etoposide      To help prevent nausea and vomiting after your treatment, we encourage you to take your nausea medication as directed.  BELOW ARE SYMPTOMS THAT SHOULD BE REPORTED IMMEDIATELY: *FEVER GREATER THAN 100.4 F (38 C) OR HIGHER *CHILLS OR SWEATING *NAUSEA AND VOMITING THAT IS NOT CONTROLLED WITH YOUR NAUSEA MEDICATION *UNUSUAL SHORTNESS OF BREATH *UNUSUAL BRUISING OR BLEEDING *URINARY PROBLEMS (pain or burning when urinating, or frequent urination) *BOWEL PROBLEMS (unusual diarrhea, constipation, pain near the anus) TENDERNESS IN MOUTH AND THROAT WITH OR WITHOUT PRESENCE OF ULCERS (sore throat, sores in mouth, or a toothache) UNUSUAL RASH, SWELLING OR PAIN  UNUSUAL VAGINAL DISCHARGE OR ITCHING   Items with * indicate a potential emergency and should be followed up as soon as possible or go to the Emergency Department if any problems should occur.  Please show the CHEMOTHERAPY ALERT CARD or  IMMUNOTHERAPY ALERT CARD at check-in to the Emergency Department and triage nurse.  Should you have questions after your visit or need to cancel or reschedule your appointment, please contact [facility]  Dept: [phone]  and follow the prompts.  Office hours are 8:00 a.m. to 4:30 p.m. Monday - Friday. Please note that voicemails left after 4:00 p.m. may not be returned until the following business day.  We are closed weekends and major holidays. You have access to a nurse at all times for urgent questions. Please call the main number to the clinic Dept: [phone] and follow the prompts.   For any non-urgent questions, you may also contact your provider using MyChart. We now offer e-Visits for anyone 50 and older to request care online for non-urgent symptoms. For details visit [email or web address].   Also download the MyChart app! Go to the app store, search "MyChart", open the app, select , and log in with your MyChart username and password.  Due to Covid, a mask is required upon entering the hospital/clinic. If you do not have a mask, one will be given to you upon arrival. For doctor visits, patients may have 1 support person aged 74 or older with them. For treatment visits, patients cannot have anyone with them due to current Covid guidelines and our immunocompromised population.      Potassium Content of Foods  Potassium is a mineral found in many foods and drinks. It affects how the heartworks, and helps keep fluids and minerals balanced in the body. The amount of potassium you need each day depends on your age and  any medical conditions you may have. Talk to your health care provider or dietitian abouthow much potassium you need. The following lists of foods provide the general serving size for foods and the approximate amount of potassium in each serving, listed in milligrams (mg).Actual values may vary depending on the product and how it is  processed. High in potassium The following foods and beverages have 200 mg or more of potassium per serving: Apricots (raw) - 2 have 200 mg of potassium. Apricots (dry) - 5 have 200 mg of potassium. Artichoke - 1 medium has 345 mg of potassium. Avocado -  fruit has 245 mg of potassium. Banana - 1 medium fruit has 425 mg of potassium. [place] or baked beans (canned) -  cup has 280 mg of potassium. White beans (canned) -  cup has 595 mg potassium. Beef roast - 3 oz has 320 mg of potassium. Ground beef - 3 oz has 270 mg of potassium. Beets (raw or cooked) -  cup has 260 mg of potassium. Bran muffin - 2 oz has 300 mg of potassium. Broccoli (cooked) -  cup has 230 mg of potassium. Brussels sprouts -  cup has 250 mg of potassium. Cantaloupe -  cup has 215 mg of potassium. Cereal, 100% bran -  cup has 200-400 mg of potassium. Cheeseburger -1 single fast food burger has 225-400 mg of potassium. Chicken - 3 oz has 220 mg of potassium. Clams (canned) - 3 oz has 535 mg of potassium. Crab - 3 oz has 225 mg of potassium. Dates - 5 have 270 mg of potassium. Dried beans and peas -  cup has 300-475 mg of potassium. Figs (dried) - 2 have 260 mg of potassium. Fish (halibut, tuna, cod, snapper) - 3 oz has 480 mg of potassium. Fish (salmon, haddock, swordfish, perch) - 3 oz has 300 mg of potassium. Fish (tuna, canned) - 3 oz has 200 mg of potassium. [place] fries (fast food) - 3 oz has 470 mg of potassium. Granola with fruit and nuts -  cup has 200 mg of potassium. Grapefruit juice -  cup has 200 mg of potassium. Honeydew melon -  cup has 200 mg of potassium. Kale (raw) - 1 cup has 300 mg of potassium. Kiwi - 1 medium fruit has 240 mg of potassium. Kohlrabi, rutabaga, parsnips -  cup has 280 mg of potassium. Lentils -  cup has 365 mg of potassium. Mango - 1 each has 325 mg of potassium. Milk (nonfat, low-fat, whole, buttermilk) - 1 cup has 350-380 mg of potassium. Milk (chocolate) - 1 cup  has 420 mg of potassium Molasses - 1 Tbsp has 295 mg of potassium. Mushrooms -  cup has 280 mg of potassium. Nectarine - 1 each has 275 mg of potassium. Nuts (almonds, peanuts, hazelnuts, [place], cashew, mixed) - 1 oz has 200 mg of potassium. Nuts (pistachios) - 1 oz has 295 mg of potassium. Orange - 1 fruit has 240 mg of potassium. Orange juice -  cup has 235 mg of potassium. Papaya -  medium fruit has 390 mg of potassium. Peanut butter (chunky) - 2 Tbsp has 240 mg of potassium. Peanut butter (smooth) - 2 Tbsp has 210 mg of potassium. Pear - 1 medium (200 mg) of potassium. Pomegranate - 1 whole fruit has 400 mg of potassium. Pomegranate juice -  cup has 215 mg of potassium. Pork - 3 oz has 350 mg of potassium. Potato chips (salted) - 1 oz has 465 mg of potassium. Potato (  baked with skin) - 1 medium has 925 mg of potassium. Potato (boiled) -  cup has 255 mg of potassium. Potato (Mashed) -  cup has 330 mg of potassium. Prune juice -  cup has 370 mg of potassium. Prunes - 5 have 305 mg of potassium. Pudding (chocolate) -  cup has 230 mg of potassium. Pumpkin (canned) -  cup has 250 mg of potassium. Raisins (seedless) -  cup has 270 mg of potassium. Seeds (sunflower or pumpkin) - 1 oz has 240 mg of potassium. Soy milk - 1 cup has 300 mg of potassium. Spinach (cooked) - 1/2 cup has 420 mg of potassium. Spinach (canned) -  cup has 370 mg of potassium. Sweet potato (baked with skin) - 1 medium has 450 mg of potassium. Swiss chard -  cup has 480 mg of potassium. Tomato or vegetable juice -  cup has 275 mg of potassium. Tomato (sauce or puree) -  cup has 400-550 mg of potassium. Tomato (raw) - 1 medium has 290 mg of potassium. Tomato (canned) -  cup has 200-300 mg of potassium. [place] - 3 oz has 250 mg of potassium. Wheat germ - 1 oz has 250 mg of potassium. Winter squash -  cup has 250 mg of potassium. Yogurt (plain or fruited) - 6 oz has 260-435 mg of  potassium. Zucchini -  cup has 220 mg of potassium. Moderate in potassium The following foods and beverages have 50-200 mg of potassium per serving: Apple - 1 fruit has 150 mg of potassium Apple juice -  cup has 150 mg of potassium Applesauce -  cup has 90 mg of potassium Apricot nectar -  cup has 140 mg of potassium Asparagus (small spears) -  cup has 155 mg of potassium Asparagus (large spears) - 6 have 155 mg of potassium Bagel (cinnamon raisin) - 1 four-inch bagel has 130 mg of potassium Bagel (egg or plain) - 1 four- inch bagel has 70 mg of potassium Beans (green) -  cup has 90 mg of potassium Beans (yellow) -  cup has 190 mg of potassium Beer, regular - 12 oz has 100 mg of potassium Beets (canned) -  cup has 125 mg of potassium Blackberries -  cup has 115 mg of potassium Blueberries -  cup has 60 mg of potassium Bread (whole wheat) - 1 slice has 70 mg of potassium Broccoli (raw) -  cup has 145 mg of potassium Cabbage -  cup has 150 mg of potassium Carrots (cooked or raw) -  cup has 180 mg of potassium Cauliflower (raw) -  cup has 150 mg of potassium Celery (raw) -  cup has 155 mg of potassium Cereal, bran flakes -  cup has 120-150 mg of potassium Cheese (cottage) -  cup has 110 mg of potassium Cherries - 10 have 150 mg of potassium Chocolate - 1 oz bar has 165 mg of potassium Coffee (brewed) - 6 oz has 90 mg of potassium Corn -  cup or 1 ear has 195 mg of potassium Cucumbers -  cup has 80 mg of potassium Egg - 1 large egg has 60 mg of potassium Eggplant -  cup has 60 mg of potassium Endive (raw) -  cup has 80 mg of potassium English muffin - 1 has 65 mg of potassium Fish (ocean perch) - 3 oz has 192 mg of potassium Frankfurter, beef or pork - 1 has 75 mg of potassium Fruit cocktail -  cup has 115 mg of potassium Grape  juice -  cup has 170 mg of potassium Grapefruit -  fruit has 175 mg of potassium Grapes -  cup has 155 mg of potassium Greens:  kale, turnip, collard -  cup has 110-150 mg of potassium Ice cream or frozen yogurt (chocolate) -  cup has 175 mg of potassium Ice cream or frozen yogurt (vanilla) -  cup has 120-150 mg of potassium Lemons, limes - 1 each has 80 mg of potassium Lettuce - 1 cup has 100 mg of potassium Mixed vegetables -  cup has 150 mg of potassium Mushrooms, raw -  cup has 110 mg of potassium Nuts (walnuts, pecans, or macadamia) - 1 oz has 125 mg of potassium Oatmeal -  cup has 80 mg of potassium Okra -  cup has 110 mg of potassium Onions -  cup has 120 mg of potassium Peach - 1 has 185 mg of potassium Peaches (canned) -  cup has 120 mg of potassium Pears (canned) -  cup has 120 mg of potassium Peas, green (frozen) -  cup has 90 mg of potassium Peppers (Green) -  cup has 130 mg of potassium Peppers (Red) -  cup has 160 mg of potassium Pineapple juice -  cup has 165 mg of potassium Pineapple (fresh or canned) -  cup has 100 mg of potassium Plums - 1 has 105 mg of potassium Pudding, vanilla -  cup has 150 mg of potassium Raspberries -  cup has 90 mg of potassium Rhubarb -  cup has 115 mg of potassium Rice, wild -  cup has 80 mg of potassium Shrimp - 3 oz has 155 mg of potassium Spinach (raw) - 1 cup has 170 mg of potassium Strawberries -  cup has 125 mg of potassium Summer squash -  cup has 175-200 mg of potassium Swiss chard (raw) - 1 cup has 135 mg of potassium Tangerines - 1 fruit has 140 mg of potassium Tea, brewed - 6 oz has 65 mg of potassium Turnips -  cup has 140 mg of potassium Watermelon -  cup has 85 mg of potassium Wine (Red, table) - 5 oz has 180 mg of potassium Wine (White, table) - 5 oz 100 mg of potassium Low in potassium The following foods and beverages have less than 50 mg of potassium per serving. Bread (white) - 1 slice has 30 mg of potassium Carbonated beverages - 12 oz has less than 5 mg of potassium Cheese - 1 oz has 20-30 mg of  potassium Cranberries -  cup has 45 mg of potassium Cranberry juice cocktail -  cup has 20 mg of potassium Fats and oils - 1 Tbsp has less than 5 mg of potassium Hummus - 1 Tbsp has 32 mg of potassium Nectar (papaya, mango, or pear) -  cup has 35 mg of potassium Rice (white or brown) -  cup has 50 mg of potassium Spaghetti or macaroni (cooked) -  cup has 30 mg of potassium Tortilla, flour or corn - 1 has 50 mg of potassium Waffle - 1 four-inch waffle has 50 mg of potassium Water chestnuts -  cup has 40 mg of potassium Summary Potassium is a mineral found in many foods and drinks. It affects how the heart works, and helps keep fluids and minerals balanced in the body. The amount of potassium you need each day depends on your age and any existing medical conditions you may have. Your health care provider or dietitian may recommend an amount of potassium that  you should have each day. This information is not intended to replace advice given to you by your health care provider. Make sure you discuss any questions you have with your healthcare provider. Document Revised: 12/16/2016 Document Reviewed: 03/30/2016 Elsevier Patient Education  [place].

## 2020-09-08 ENCOUNTER — Inpatient Hospital Stay: Payer: Medicare Other

## 2020-09-08 VITALS — BP 126/68 | HR 57 | Temp 98.3°F | Resp 16

## 2020-09-08 DIAGNOSIS — Z5111 Encounter for antineoplastic chemotherapy: Secondary | ICD-10-CM | POA: Diagnosis not present

## 2020-09-08 DIAGNOSIS — C3491 Malignant neoplasm of unspecified part of right bronchus or lung: Secondary | ICD-10-CM

## 2020-09-08 MED ORDER — SODIUM CHLORIDE 0.9 % IV SOLN
100.0000 mg/m2 | Freq: Once | INTRAVENOUS | Status: AC
  Administered 2020-09-08: 220 mg via INTRAVENOUS
  Filled 2020-09-08: qty 11

## 2020-09-08 MED ORDER — SODIUM CHLORIDE 0.9 % IV SOLN
10.0000 mg | Freq: Once | INTRAVENOUS | Status: AC
  Administered 2020-09-08: 10 mg via INTRAVENOUS
  Filled 2020-09-08: qty 10

## 2020-09-08 MED ORDER — SODIUM CHLORIDE 0.9 % IV SOLN
Freq: Once | INTRAVENOUS | Status: AC
Start: 2020-09-08 — End: 2020-09-08

## 2020-09-08 MED FILL — Dexamethasone Sodium Phosphate Inj 100 MG/10ML: INTRAMUSCULAR | Qty: 1 | Status: AC

## 2020-09-08 NOTE — Patient Instructions (Signed)
[facility]  Discharge Instructions: Thank you for choosing [facility] to provide your oncology and hematology care.   If you have a lab appointment with the Cancer Center, please go directly to the Cancer Center and check in at the registration area.   Wear comfortable clothing and clothing appropriate for easy access to any Portacath or PICC line.   We strive to give you quality time with your provider. You may need to reschedule your appointment if you arrive late (15 or more minutes).  Arriving late affects you and other patients whose appointments are after yours.  Also, if you miss three or more appointments without notifying the office, you may be dismissed from the clinic at the provider's discretion.      For prescription refill requests, have your pharmacy contact our office and allow 72 hours for refills to be completed.    Today you received the following chemotherapy and/or immunotherapy agents: etoposide.     To help prevent nausea and vomiting after your treatment, we encourage you to take your nausea medication as directed.  BELOW ARE SYMPTOMS THAT SHOULD BE REPORTED IMMEDIATELY: . *FEVER GREATER THAN 100.4 F (38 C) OR HIGHER . *CHILLS OR SWEATING . *NAUSEA AND VOMITING THAT IS NOT CONTROLLED WITH YOUR NAUSEA MEDICATION . *UNUSUAL SHORTNESS OF BREATH . *UNUSUAL BRUISING OR BLEEDING . *URINARY PROBLEMS (pain or burning when urinating, or frequent urination) . *BOWEL PROBLEMS (unusual diarrhea, constipation, pain near the anus) . TENDERNESS IN MOUTH AND THROAT WITH OR WITHOUT PRESENCE OF ULCERS (sore throat, sores in mouth, or a toothache) . UNUSUAL RASH, SWELLING OR PAIN  . UNUSUAL VAGINAL DISCHARGE OR ITCHING   Items with * indicate a potential emergency and should be followed up as soon as possible or go to the Emergency Department if any problems should occur.  Please show the CHEMOTHERAPY ALERT CARD or IMMUNOTHERAPY ALERT  CARD at check-in to the Emergency Department and triage nurse.  Should you have questions after your visit or need to cancel or reschedule your appointment, please contact [facility]  Dept: [phone]  and follow the prompts.  Office hours are 8:00 a.m. to 4:30 p.m. Monday - Friday. Please note that voicemails left after 4:00 p.m. may not be returned until the following business day.  We are closed weekends and major holidays. You have access to a nurse at all times for urgent questions. Please call the main number to the clinic Dept: [phone] and follow the prompts.   For any non-urgent questions, you may also contact your provider using MyChart. We now offer e-Visits for anyone 18 and older to request care online for non-urgent symptoms. For details visit [email or web address].   Also download the MyChart app! Go to the app store, search "MyChart", open the app, select [facility], and log in with your MyChart username and password.  Due to Covid, a mask is required upon entering the hospital/clinic. If you do not have a mask, one will be given to you upon arrival. For doctor visits, patients may have 1 support person aged 18 or older with them. For treatment visits, patients cannot have anyone with them due to current Covid guidelines and our immunocompromised population.   

## 2020-09-09 ENCOUNTER — Inpatient Hospital Stay: Payer: Medicare Other

## 2020-09-09 ENCOUNTER — Other Ambulatory Visit: Payer: Self-pay

## 2020-09-09 VITALS — BP 140/66 | HR 56 | Temp 98.3°F | Resp 18 | Wt 200.5 lb

## 2020-09-09 DIAGNOSIS — C3491 Malignant neoplasm of unspecified part of right bronchus or lung: Secondary | ICD-10-CM

## 2020-09-09 DIAGNOSIS — Z5111 Encounter for antineoplastic chemotherapy: Secondary | ICD-10-CM | POA: Diagnosis not present

## 2020-09-09 MED ORDER — SODIUM CHLORIDE 0.9 % IV SOLN
100.0000 mg/m2 | Freq: Once | INTRAVENOUS | Status: AC
  Administered 2020-09-09: 220 mg via INTRAVENOUS
  Filled 2020-09-09: qty 11

## 2020-09-09 MED ORDER — SODIUM CHLORIDE 0.9 % IV SOLN
10.0000 mg | Freq: Once | INTRAVENOUS | Status: AC
  Administered 2020-09-09: 10 mg via INTRAVENOUS
  Filled 2020-09-09: qty 10

## 2020-09-09 MED ORDER — SODIUM CHLORIDE 0.9 % IV SOLN: Freq: Once | INTRAVENOUS | Status: AC

## 2020-09-09 NOTE — Patient Instructions (Signed)
[facility]  Discharge Instructions: Thank you for choosing [facility] to provide your oncology and hematology care.   If you have a lab appointment with the [facility], please go directly to the [facility] and check in at the registration area.   Wear comfortable clothing and clothing appropriate for easy access to any Portacath or PICC line.   We strive to give you quality time with your provider. You may need to reschedule your appointment if you arrive late (15 or more minutes).  Arriving late affects you and other patients whose appointments are after yours.  Also, if you miss three or more appointments without notifying the office, you may be dismissed from the clinic at the provider's discretion.      For prescription refill requests, have your pharmacy contact our office and allow 72 hours for refills to be completed.    Today you received the following chemotherapy and/or immunotherapy agents Etoposide      To help prevent nausea and vomiting after your treatment, we encourage you to take your nausea medication as directed.  BELOW ARE SYMPTOMS THAT SHOULD BE REPORTED IMMEDIATELY: *FEVER GREATER THAN 100.4 F (38 C) OR HIGHER *CHILLS OR SWEATING *NAUSEA AND VOMITING THAT IS NOT CONTROLLED WITH YOUR NAUSEA MEDICATION *UNUSUAL SHORTNESS OF BREATH *UNUSUAL BRUISING OR BLEEDING *URINARY PROBLEMS (pain or burning when urinating, or frequent urination) *BOWEL PROBLEMS (unusual diarrhea, constipation, pain near the anus) TENDERNESS IN MOUTH AND THROAT WITH OR WITHOUT PRESENCE OF ULCERS (sore throat, sores in mouth, or a toothache) UNUSUAL RASH, SWELLING OR PAIN  UNUSUAL VAGINAL DISCHARGE OR ITCHING   Items with * indicate a potential emergency and should be followed up as soon as possible or go to the Emergency Department if any problems should occur.  Please show the CHEMOTHERAPY ALERT CARD or IMMUNOTHERAPY ALERT CARD at check-in to  the Emergency Department and triage nurse.  Should you have questions after your visit or need to cancel or reschedule your appointment, please contact [facility]  Dept: [phone]  and follow the prompts.  Office hours are 8:00 a.m. to 4:30 p.m. Monday - Friday. Please note that voicemails left after 4:00 p.m. may not be returned until the following business day.  We are closed weekends and major holidays. You have access to a nurse at all times for urgent questions. Please call the main number to the clinic Dept: [phone] and follow the prompts.   For any non-urgent questions, you may also contact your provider using MyChart. We now offer e-Visits for anyone 71 and older to request care online for non-urgent symptoms. For details visit [email or web address].   Also download the MyChart app! Go to the app store, search "MyChart", open the app, select , and log in with your MyChart username and password.  Due to Covid, a mask is required upon entering the hospital/clinic. If you do not have a mask, one will be given to you upon arrival. For doctor visits, patients may have 1 support person aged 71 or older with them. For treatment visits, patients cannot have anyone with them due to current Covid guidelines and our immunocompromised population.

## 2020-09-10 ENCOUNTER — Other Ambulatory Visit (HOSPITAL_COMMUNITY): Payer: Self-pay

## 2020-09-14 ENCOUNTER — Other Ambulatory Visit: Payer: Self-pay

## 2020-09-14 ENCOUNTER — Inpatient Hospital Stay: Payer: Medicare Other

## 2020-09-14 DIAGNOSIS — C3491 Malignant neoplasm of unspecified part of right bronchus or lung: Secondary | ICD-10-CM

## 2020-09-14 DIAGNOSIS — Z5111 Encounter for antineoplastic chemotherapy: Secondary | ICD-10-CM | POA: Diagnosis not present

## 2020-09-14 LAB — CMP (CANCER CENTER ONLY)
ALT: 11 U/L (ref 0–44)
AST: 10 U/L — ABNORMAL LOW (ref 15–41)
Albumin: 3.2 g/dL — ABNORMAL LOW (ref 3.5–5.0)
Alkaline Phosphatase: 63 U/L (ref 38–126)
Anion gap: 10 (ref 5–15)
BUN: 18 mg/dL (ref 8–23)
CO2: 29 mmol/L (ref 22–32)
Calcium: 8.9 mg/dL (ref 8.9–10.3)
Chloride: 95 mmol/L — ABNORMAL LOW (ref 98–111)
Creatinine: 0.77 mg/dL (ref 0.61–1.24)
GFR, Estimated: >60 mL/min (ref >60)
Glucose, Bld: 114 mg/dL — ABNORMAL HIGH (ref 70–99)
Potassium: 3.5 mmol/L (ref 3.5–5.1)
Sodium: 134 mmol/L — ABNORMAL LOW (ref 135–145)
Total Bilirubin: 0.8 mg/dL (ref 0.3–1.2)
Total Protein: 6.5 g/dL (ref 6.5–8.1)

## 2020-09-14 LAB — CBC WITH DIFFERENTIAL (CANCER CENTER ONLY)
Abs Immature Granulocytes: 0.04 10*3/uL (ref 0.00–0.07)
Basophils Absolute: 0 10*3/uL (ref 0.0–0.1)
Basophils Relative: 1 %
Eosinophils Absolute: 0 10*3/uL (ref 0.0–0.5)
Eosinophils Relative: 1 %
HCT: 29.8 % — ABNORMAL LOW (ref 39.0–52.0)
Hemoglobin: 10.2 g/dL — ABNORMAL LOW (ref 13.0–17.0)
Immature Granulocytes: 1 %
Lymphocytes Relative: 21 %
Lymphs Abs: 1 10*3/uL (ref 0.7–4.0)
MCH: 28.5 pg (ref 26.0–34.0)
MCHC: 34.2 g/dL (ref 30.0–36.0)
MCV: 83.2 fL (ref 80.0–100.0)
Monocytes Absolute: 0.1 10*3/uL (ref 0.1–1.0)
Monocytes Relative: 2 %
Neutro Abs: 3.7 10*3/uL (ref 1.7–7.7)
Neutrophils Relative %: 74 %
Platelet Count: 191 10*3/uL (ref 150–400)
RBC: 3.58 MIL/uL — ABNORMAL LOW (ref 4.22–5.81)
RDW: 16.6 % — ABNORMAL HIGH (ref 11.5–15.5)
Smear Review: NORMAL
WBC Count: 4.9 10*3/uL (ref 4.0–10.5)
nRBC: 0 % (ref 0.0–0.2)

## 2020-09-14 LAB — TSH: TSH: 1.255 u[IU]/mL (ref 0.320–4.118)

## 2020-09-22 ENCOUNTER — Other Ambulatory Visit: Payer: Self-pay

## 2020-09-22 ENCOUNTER — Inpatient Hospital Stay: Payer: Medicare Other | Attending: Internal Medicine

## 2020-09-22 DIAGNOSIS — I10 Essential (primary) hypertension: Secondary | ICD-10-CM | POA: Diagnosis not present

## 2020-09-22 DIAGNOSIS — I509 Heart failure, unspecified: Secondary | ICD-10-CM | POA: Diagnosis not present

## 2020-09-22 DIAGNOSIS — I252 Old myocardial infarction: Secondary | ICD-10-CM | POA: Insufficient documentation

## 2020-09-22 DIAGNOSIS — Z5111 Encounter for antineoplastic chemotherapy: Secondary | ICD-10-CM | POA: Diagnosis present

## 2020-09-22 DIAGNOSIS — Z5189 Encounter for other specified aftercare: Secondary | ICD-10-CM | POA: Diagnosis not present

## 2020-09-22 DIAGNOSIS — J449 Chronic obstructive pulmonary disease, unspecified: Secondary | ICD-10-CM | POA: Insufficient documentation

## 2020-09-22 DIAGNOSIS — E119 Type 2 diabetes mellitus without complications: Secondary | ICD-10-CM | POA: Diagnosis not present

## 2020-09-22 DIAGNOSIS — C3431 Malignant neoplasm of lower lobe, right bronchus or lung: Secondary | ICD-10-CM | POA: Diagnosis present

## 2020-09-22 DIAGNOSIS — I2510 Atherosclerotic heart disease of native coronary artery without angina pectoris: Secondary | ICD-10-CM | POA: Insufficient documentation

## 2020-09-22 DIAGNOSIS — C3491 Malignant neoplasm of unspecified part of right bronchus or lung: Secondary | ICD-10-CM

## 2020-09-22 DIAGNOSIS — C771 Secondary and unspecified malignant neoplasm of intrathoracic lymph nodes: Secondary | ICD-10-CM | POA: Insufficient documentation

## 2020-09-22 DIAGNOSIS — I639 Cerebral infarction, unspecified: Secondary | ICD-10-CM | POA: Diagnosis not present

## 2020-09-22 LAB — CBC WITH DIFFERENTIAL (CANCER CENTER ONLY)
Abs Immature Granulocytes: 0 10*3/uL (ref 0.00–0.07)
Basophils Absolute: 0 10*3/uL (ref 0.0–0.1)
Basophils Relative: 0 %
Eosinophils Absolute: 0 10*3/uL (ref 0.0–0.5)
Eosinophils Relative: 1 %
HCT: 30.4 % — ABNORMAL LOW (ref 39.0–52.0)
Hemoglobin: 10.3 g/dL — ABNORMAL LOW (ref 13.0–17.0)
Immature Granulocytes: 0 %
Lymphocytes Relative: 48 %
Lymphs Abs: 1.1 10*3/uL (ref 0.7–4.0)
MCH: 28.9 pg (ref 26.0–34.0)
MCHC: 33.9 g/dL (ref 30.0–36.0)
MCV: 85.4 fL (ref 80.0–100.0)
Monocytes Absolute: 0.4 10*3/uL (ref 0.1–1.0)
Monocytes Relative: 17 %
Neutro Abs: 0.8 10*3/uL — ABNORMAL LOW (ref 1.7–7.7)
Neutrophils Relative %: 34 %
Platelet Count: 92 10*3/uL — ABNORMAL LOW (ref 150–400)
RBC: 3.56 MIL/uL — ABNORMAL LOW (ref 4.22–5.81)
RDW: 18.1 % — ABNORMAL HIGH (ref 11.5–15.5)
WBC Count: 2.3 10*3/uL — ABNORMAL LOW (ref 4.0–10.5)
nRBC: 0 % (ref 0.0–0.2)

## 2020-09-22 LAB — CMP (CANCER CENTER ONLY)
ALT: 12 U/L (ref 0–44)
AST: 9 U/L — ABNORMAL LOW (ref 15–41)
Albumin: 3.5 g/dL (ref 3.5–5.0)
Alkaline Phosphatase: 90 U/L (ref 38–126)
Anion gap: 11 (ref 5–15)
BUN: 6 mg/dL — ABNORMAL LOW (ref 8–23)
CO2: 27 mmol/L (ref 22–32)
Calcium: 9.3 mg/dL (ref 8.9–10.3)
Chloride: 99 mmol/L (ref 98–111)
Creatinine: 0.92 mg/dL (ref 0.61–1.24)
GFR, Estimated: >60 mL/min (ref >60)
Glucose, Bld: 140 mg/dL — ABNORMAL HIGH (ref 70–99)
Potassium: 3.8 mmol/L (ref 3.5–5.1)
Sodium: 137 mmol/L (ref 135–145)
Total Bilirubin: 0.4 mg/dL (ref 0.3–1.2)
Total Protein: 7.2 g/dL (ref 6.5–8.1)

## 2020-09-23 ENCOUNTER — Other Ambulatory Visit (HOSPITAL_COMMUNITY): Payer: Self-pay

## 2020-09-25 MED FILL — Losartan Potassium Tab 100 MG: ORAL | 90 days supply | Qty: 90 | Fill #1 | Status: CN

## 2020-09-26 ENCOUNTER — Other Ambulatory Visit (HOSPITAL_COMMUNITY): Payer: Self-pay

## 2020-09-26 MED FILL — Losartan Potassium Tab 100 MG: ORAL | 90 days supply | Qty: 90 | Fill #0 | Status: AC

## 2020-09-28 ENCOUNTER — Inpatient Hospital Stay: Payer: Medicare Other

## 2020-09-28 ENCOUNTER — Inpatient Hospital Stay (HOSPITAL_BASED_OUTPATIENT_CLINIC_OR_DEPARTMENT_OTHER): Payer: Medicare Other | Admitting: Internal Medicine

## 2020-09-28 ENCOUNTER — Other Ambulatory Visit: Payer: Self-pay | Admitting: Medical Oncology

## 2020-09-28 ENCOUNTER — Encounter: Payer: Self-pay | Admitting: Internal Medicine

## 2020-09-28 ENCOUNTER — Other Ambulatory Visit (HOSPITAL_COMMUNITY): Payer: Self-pay

## 2020-09-28 ENCOUNTER — Other Ambulatory Visit: Payer: Self-pay

## 2020-09-28 VITALS — BP 123/67 | HR 71 | Temp 97.7°F | Resp 20 | Ht 72.0 in | Wt 195.8 lb

## 2020-09-28 DIAGNOSIS — C3491 Malignant neoplasm of unspecified part of right bronchus or lung: Secondary | ICD-10-CM | POA: Diagnosis not present

## 2020-09-28 DIAGNOSIS — Z5111 Encounter for antineoplastic chemotherapy: Secondary | ICD-10-CM | POA: Diagnosis not present

## 2020-09-28 DIAGNOSIS — Z5112 Encounter for antineoplastic immunotherapy: Secondary | ICD-10-CM

## 2020-09-28 DIAGNOSIS — C3490 Malignant neoplasm of unspecified part of unspecified bronchus or lung: Secondary | ICD-10-CM

## 2020-09-28 LAB — CBC WITH DIFFERENTIAL (CANCER CENTER ONLY)
Abs Immature Granulocytes: 0.07 10*3/uL (ref 0.00–0.07)
Basophils Absolute: 0 10*3/uL (ref 0.0–0.1)
Basophils Relative: 0 %
Eosinophils Absolute: 0.1 10*3/uL (ref 0.0–0.5)
Eosinophils Relative: 1 %
HCT: 34 % — ABNORMAL LOW (ref 39.0–52.0)
Hemoglobin: 11.3 g/dL — ABNORMAL LOW (ref 13.0–17.0)
Immature Granulocytes: 2 %
Lymphocytes Relative: 28 %
Lymphs Abs: 1.3 10*3/uL (ref 0.7–4.0)
MCH: 28.9 pg (ref 26.0–34.0)
MCHC: 33.2 g/dL (ref 30.0–36.0)
MCV: 87 fL (ref 80.0–100.0)
Monocytes Absolute: 0.9 10*3/uL (ref 0.1–1.0)
Monocytes Relative: 20 %
Neutro Abs: 2.3 10*3/uL (ref 1.7–7.7)
Neutrophils Relative %: 49 %
Platelet Count: 223 10*3/uL (ref 150–400)
RBC: 3.91 MIL/uL — ABNORMAL LOW (ref 4.22–5.81)
RDW: 19 % — ABNORMAL HIGH (ref 11.5–15.5)
WBC Count: 4.7 10*3/uL (ref 4.0–10.5)
nRBC: 0 % (ref 0.0–0.2)

## 2020-09-28 LAB — CMP (CANCER CENTER ONLY)
ALT: 11 U/L (ref 0–44)
AST: 10 U/L — ABNORMAL LOW (ref 15–41)
Albumin: 3.4 g/dL — ABNORMAL LOW (ref 3.5–5.0)
Alkaline Phosphatase: 96 U/L (ref 38–126)
Anion gap: 10 (ref 5–15)
BUN: 6 mg/dL — ABNORMAL LOW (ref 8–23)
CO2: 27 mmol/L (ref 22–32)
Calcium: 9.4 mg/dL (ref 8.9–10.3)
Chloride: 99 mmol/L (ref 98–111)
Creatinine: 0.88 mg/dL (ref 0.61–1.24)
GFR, Estimated: >60 mL/min (ref >60)
Glucose, Bld: 133 mg/dL — ABNORMAL HIGH (ref 70–99)
Potassium: 3.1 mmol/L — ABNORMAL LOW (ref 3.5–5.1)
Sodium: 136 mmol/L (ref 135–145)
Total Bilirubin: 0.3 mg/dL (ref 0.3–1.2)
Total Protein: 7.5 g/dL (ref 6.5–8.1)

## 2020-09-28 MED ORDER — SODIUM CHLORIDE 0.9 % IV SOLN
10.0000 mg | Freq: Once | INTRAVENOUS | Status: AC
  Administered 2020-09-28: 10 mg via INTRAVENOUS
  Filled 2020-09-28: qty 10

## 2020-09-28 MED ORDER — PALONOSETRON HCL INJECTION 0.25 MG/5ML
0.2500 mg | Freq: Once | INTRAVENOUS | Status: AC
  Administered 2020-09-28: 0.25 mg via INTRAVENOUS
  Filled 2020-09-28: qty 5

## 2020-09-28 MED ORDER — SODIUM CHLORIDE 0.9 % IV SOLN
100.0000 mg/m2 | Freq: Once | INTRAVENOUS | Status: AC
  Administered 2020-09-28: 220 mg via INTRAVENOUS
  Filled 2020-09-28: qty 11

## 2020-09-28 MED ORDER — SODIUM CHLORIDE 0.9 % IV SOLN
150.0000 mg | Freq: Once | INTRAVENOUS | Status: AC
  Administered 2020-09-28: 150 mg via INTRAVENOUS
  Filled 2020-09-28: qty 150

## 2020-09-28 MED ORDER — DIPHENHYDRAMINE HCL 50 MG/ML IJ SOLN
25.0000 mg | Freq: Once | INTRAMUSCULAR | Status: AC
  Administered 2020-09-28: 25 mg via INTRAVENOUS
  Filled 2020-09-28: qty 1

## 2020-09-28 MED ORDER — POTASSIUM CHLORIDE CRYS ER 20 MEQ PO TBCR
20.0000 meq | EXTENDED_RELEASE_TABLET | Freq: Every day | ORAL | 0 refills | Status: DC
  Filled 2020-09-28: qty 10, 10d supply, fill #0

## 2020-09-28 MED ORDER — SODIUM CHLORIDE 0.9 % IV SOLN
Freq: Once | INTRAVENOUS | Status: AC
Start: 2020-09-28 — End: 2020-09-28

## 2020-09-28 MED ORDER — SODIUM CHLORIDE 0.9 % IV SOLN
1500.0000 mg | Freq: Once | INTRAVENOUS | Status: AC
  Administered 2020-09-28: 1500 mg via INTRAVENOUS
  Filled 2020-09-28: qty 30

## 2020-09-28 MED ORDER — FAMOTIDINE 20 MG IN NS 100 ML IVPB
20.0000 mg | Freq: Once | INTRAVENOUS | Status: AC
  Administered 2020-09-28: 20 mg via INTRAVENOUS
  Filled 2020-09-28: qty 100

## 2020-09-28 MED ORDER — SODIUM CHLORIDE 0.9 % IV SOLN
568.5000 mg | Freq: Once | INTRAVENOUS | Status: AC
  Administered 2020-09-28: 570 mg via INTRAVENOUS
  Filled 2020-09-28: qty 57

## 2020-09-28 NOTE — Patient Instructions (Signed)
[facility]  Discharge Instructions: Thank you for choosing [facility] to provide your oncology and hematology care.   If you have a lab appointment with the Agency, please go directly to the [facility] and check in at the registration area.   Wear comfortable clothing and clothing appropriate for easy access to any Portacath or PICC line.   We strive to give you quality time with your provider. You may need to reschedule your appointment if you arrive late (15 or more minutes).  Arriving late affects you and other patients whose appointments are after yours.  Also, if you miss three or more appointments without notifying the office, you may be dismissed from the clinic at the provider's discretion.      For prescription refill requests, have your pharmacy contact our office and allow 72 hours for refills to be completed.    Today you received the following chemotherapy and/or immunotherapy agents : Imfinzi, Carboplatin, Etoposide      To help prevent nausea and vomiting after your treatment, we encourage you to take your nausea medication as directed.  BELOW ARE SYMPTOMS THAT SHOULD BE REPORTED IMMEDIATELY: *FEVER GREATER THAN 100.4 F (38 C) OR HIGHER *CHILLS OR SWEATING *NAUSEA AND VOMITING THAT IS NOT CONTROLLED WITH YOUR NAUSEA MEDICATION *UNUSUAL SHORTNESS OF BREATH *UNUSUAL BRUISING OR BLEEDING *URINARY PROBLEMS (pain or burning when urinating, or frequent urination) *BOWEL PROBLEMS (unusual diarrhea, constipation, pain near the anus) TENDERNESS IN MOUTH AND THROAT WITH OR WITHOUT PRESENCE OF ULCERS (sore throat, sores in mouth, or a toothache) UNUSUAL RASH, SWELLING OR PAIN  UNUSUAL VAGINAL DISCHARGE OR ITCHING   Items with * indicate a potential emergency and should be followed up as soon as possible or go to the Emergency Department if any problems should occur.  Please show the CHEMOTHERAPY ALERT CARD or IMMUNOTHERAPY  ALERT CARD at check-in to the Emergency Department and triage nurse.  Should you have questions after your visit or need to cancel or reschedule your appointment, please contact [facility]  Dept: [phone]  and follow the prompts.  Office hours are 8:00 a.m. to 4:30 p.m. Monday - Friday. Please note that voicemails left after 4:00 p.m. may not be returned until the following business day.  We are closed weekends and major holidays. You have access to a nurse at all times for urgent questions. Please call the main number to the clinic Dept: [phone] and follow the prompts.   For any non-urgent questions, you may also contact your provider using MyChart. We now offer e-Visits for anyone 52 and older to request care online for non-urgent symptoms. For details visit [email or web address].   Also download the MyChart app! Go to the app store, search "MyChart", open the app, select [facility], and log in with your MyChart username and password.  Due to Covid, a mask is required upon entering the hospital/clinic. If you do not have a mask, one will be given to you upon arrival. For doctor visits, patients may have 1 support person aged 27 or older with them. For treatment visits, patients cannot have anyone with them due to current Covid guidelines and our immunocompromised population.

## 2020-09-28 NOTE — Progress Notes (Signed)
[facility] [phone]   Fax:([phone]) [phone]  OFFICE PROGRESS NOTE  [name], MD [address] [place] [address]  DIAGNOSIS: Relapsed small cell lung cancer initially diagnosed as limited stage (T3, N2, M0) small cell lung cancer presented with pulmonary nodules and pleural-based nodules in the right lung in addition to mediastinal lymphadenopathy diagnosed in August 2021.  The patient was previously treated without biopsy to a pleural-based nodule in the right lower lobe with SBRT in March 2021 but had evidence for disease recurrence and progression.  He has disease relapse in June 2022.  PRIOR THERAPY:  1) SBRT to right lower lobe pulmonary nodule in March 2021 under the care of Dr. Sondra Come. 2) Systemic chemotherapy with carboplatin for AUC of 5 on day 1 and etoposide 100 mg/M2 on days 1, 2 and 3 with Neulasta support every 3 weeks.  First cycle 10/07/2019.  Status post 4 cycles.  Last dose of the chemotherapy was given on December 09, 2019.  CURRENT THERAPY: Second line systemic chemotherapy with carboplatin for AUC of 5 on day 1, etoposide 100 Mg/M2 on days 1, 2 and 3 with Cosela before the chemotherapy in addition to Imfinzi 1500 Mg IV on day 1 every 3 weeks.  First dose July 27, 2020.  Status post 3 cycles.  Cosela was discontinued secondary to phlebitis.  INTERVAL HISTORY: Jonathon [name] 70 y.o. male returns to the clinic today for follow-up visit accompanied by his wife.  The patient is feeling fine today with no concerning complaints except for fatigue that lasted for several days after his chemotherapy.  He denied having any current chest pain, shortness of breath, cough or hemoptysis.  He denied having any fever or chills.  He has no nausea, vomiting, diarrhea or constipation.  He denied having any headache or visual changes.  He has no weight loss or night sweats.  He is here today for evaluation before starting cycle #4 of  his treatment.  MEDICAL HISTORY: Past Medical History:  Diagnosis Date   Cancer ([place])    CHF (congestive heart failure) (HCC)    Chronic cough    COPD, severe (HCC)    PT DENIES REGULAR DAILY INHALER ANY PRN   Coronary artery disease    CVA (cerebral vascular accident) ([place]) 02/22/2011   LEFT BRAIN STEM PONTINE HEMORRHAGE--  RIGHT SIDED WEAKNESS   Diabetes mellitus without complication (HCC)    type 2   Dyspnea    GERD (gastroesophageal reflux disease)    Harsh voice quality    PT STATES NORMAL FOR HIM   High cholesterol    History of CHF (congestive heart failure) MONITORED BY DR Coletta Memos   DIASTOLIC   History of radiation therapy 03/13/19-03/20/19   SBRT Right Lung; Dr. Gery Pray   History of radiation therapy 02/17/20-02/28/20   Whole brain radiation; Dr. Gery Pray   History of radiation therapy 05/25/2020   05/18/2020-05/25/2020  SBRT Right Lung; Dr. Gery Pray   Hydrocele, left    Hypertension    NSTEMI (non-ST elevated myocardial infarction) ([facility]) 12/2015   Pre-operative clearance    GIVEN BY DR Coletta Memos   Short of breath on exertion    Smoker    Weakness of right side of body    S/P CVA   FEB 2013    ALLERGIES:  is allergic to atorvastatin.  MEDICATIONS:  Current Outpatient Medications  Medication Sig Dispense Refill   albuterol (PROVENTIL) (2.5 MG/3ML)  0.083% nebulizer solution Take 2.5 mg by nebulization every 4 (four) hours as needed for wheezing or shortness of breath.     Albuterol Sulfate (PROAIR RESPICLICK) 096 (90 BASE) MCG/ACT AEPB Inhale 2 puffs into the lungs 4 (four) times daily as needed. 1 each 5   amLODipine (NORVASC) 10 MG tablet TAKE 1 TABLET BY MOUTH ONCE A DAY 90 tablet 3   atorvastatin (LIPITOR) 20 MG tablet Take one tablet (20 mg dose) by mouth daily. 30 tablet 5   bisoprolol (ZEBETA) 5 MG tablet TAKE ONE (1) TABLET BY MOUTH EVERY DAY 90 tablet 3   cloNIDine (CATAPRES) 0.3 MG tablet TAKE 1 TABLET BY MOUTH TWICE DAILY 180 tablet 3    Cyanocobalamin (VITAMIN B 12 PO) Take 1 tablet by mouth every morning.      cyclobenzaprine (FLEXERIL) 5 MG tablet TAKE 1 TABLET BY MOUTH AT BEDTIME AS NEEDED FOR MUSCLE SPASMS 30 tablet 3   dabigatran (PRADAXA) 150 MG CAPS capsule Take 150 mg by mouth 2 (two) times daily.     DULoxetine (CYMBALTA) 30 MG capsule TAKE ONE CAPSULE BY MOUTH DAILY 90 capsule 3   gabapentin (NEURONTIN) 400 MG capsule TAKE 2 CAPSULES BY MOUTH THREE TIMES A DAY 180 capsule 7   hydrochlorothiazide (MICROZIDE) 12.5 MG capsule TAKE 1 CAPSULE (12.5 MG TOTAL) BY MOUTH DAILY. 90 capsule 2   lidocaine-prilocaine (EMLA) cream Apply 1 application topically as needed to port-a-cath site. 30 g 2   losartan (COZAAR) 100 MG tablet TAKE 1 TABLET (100 MG TOTAL) BY MOUTH DAILY. 90 tablet 3   metFORMIN (GLUCOPHAGE) 500 MG tablet TAKE 1 TABLET BY MOUTH ONCE A DAY WITH BREAKFAST 90 tablet 3   mupirocin cream (BACTROBAN) 2 % APPLY TOPICALLY 2 (TWO) TIMES DAILY. 15 g 0   nitroGLYCERIN (NITROSTAT) 0.4 MG SL tablet PLACE ONE TABLET (0.4 MG DOSE) UNDER THE TONGUE EVERY 5 (FIVE) MINUTES AS NEEDED. 25 tablet 5   nortriptyline (PAMELOR) 25 MG capsule TAKE 1 CAPSULE BY MOUTH EVERY NIGHT AT BEDTIME 90 capsule 0   pantoprazole (PROTONIX) 20 MG tablet TAKE 1 TABLET BY MOUTH DAILY 90 tablet 2   potassium chloride SA (KLOR-CON) 20 MEQ tablet Take 1 tablet (20 mEq total) by mouth daily. 7 tablet 0   prochlorperazine (COMPAZINE) 10 MG tablet Take 1 tablet (10 mg total) by mouth every 6 (six) hours as needed for nausea or vomiting. 30 tablet 0   SYMBICORT 160-4.5 MCG/ACT inhaler INHALE 2 PUFFS INTO THE LUNGS TWICE DAILY 10.2 g 11   tamsulosin (FLOMAX) 0.4 MG CAPS capsule TAKE 1 CAPSULE BY MOUTH DAILY 90 capsule 3   temazepam (RESTORIL) 15 MG capsule Take by mouth.     Tiotropium Bromide Monohydrate (SPIRIVA RESPIMAT) 2.5 MCG/ACT AERS Inhale two puffs into the lungs as needed. 4 g 11   No current facility-administered medications for this visit.     SURGICAL HISTORY:  Past Surgical History:  Procedure Laterality Date   ABDOMINAL ANGIOGRAM  09/04/2012   Procedure: ABDOMINAL ANGIOGRAM;  Surgeon: Laverda Page, MD;  [facility] C[facility] LAB;  Service: Cardiovascular;;   CARDIAC CATHETERIZATION  05-02-2011  DR Johnsie Cancel   MILD NONOBSTRUCTIVE CAD/ LAD, OM , AND D1/ EF 60^   CARDIAC CATHETERIZATION  AUG 2000   NON-OBSTRUTIVE CAD/ EF 68%/ 25% PROXIMAL LAD/ 20% MID CIRCUMFLEX   CARDIAC CATHETERIZATION [name] 12/23/2015   Procedure: Left Heart Cath and Coronary Angiography;  Surgeon: Adrian Prows, MD;  Location: [facility] CV LAB;  Service: Cardiovascular;  Laterality: [name];   CARDIAC CATHETERIZATION [name] 12/24/2015   Procedure: Coronary Stent Intervention;  Surgeon: Adrian Prows, MD;  Location: [facility] CV LAB;  Service: Cardiovascular;  Laterality: [name];   CORONARY ANGIOPLASTY     CORONARY STENT PLACEMENT  12/24/2015    PTCA and stenting of the distal RCA    HYDROCELE EXCISION Left 04/24/2012   Procedure: HYDROCELECTOMY ADULT;  Surgeon: Fredricka Bonine, MD;  Location: [facility];  Service: Urology;  Laterality: Left;  90 mins req for this case      [place]   LEFT AND RIGHT HEART CATHETERIZATION WITH CORONARY ANGIOGRAM [name] 09/04/2012   Procedure: LEFT AND RIGHT HEART CATHETERIZATION WITH CORONARY ANGIOGRAM;  Surgeon: Laverda Page, MD;  Location: [facility] CATH LAB;  Service: Cardiovascular;  Laterality: [name];   LEFT HEART CATHETERIZATION WITH CORONARY ANGIOGRAM [name] 05/02/2011   Procedure: LEFT HEART CATHETERIZATION WITH CORONARY ANGIOGRAM;  Surgeon: Josue Hector, MD;  Location: [facility] CATH LAB;  Service: Cardiovascular;  Laterality: [name];   THORACOTOMY/LOBECTOMY Right 03-06-2008   AND STAPLING OF BLEBS FOR SPONTANOUS PNEUMOTHORAX   TRANSTHORACIC ECHOCARDIOGRAM  02-17-2011   MODERATE LVH/ LVSF NORMAL/ EF 02-40%/ GRADE I DIASTOLIC DYSFUNCTION    REVIEW OF SYSTEMS:  A comprehensive review of systems was negative  except for: Constitutional: positive for fatigue   PHYSICAL EXAMINATION: General appearance: alert, cooperative, fatigued, and no distress Head: Normocephalic, without obvious abnormality, atraumatic Neck: no adenopathy, no JVD, supple, symmetrical, trachea midline, and thyroid not enlarged, symmetric, no tenderness/mass/nodules Lymph nodes: Cervical, supraclavicular, and axillary nodes normal. Resp: wheezes bilaterally Back: symmetric, no curvature. ROM normal. No CVA tenderness. Cardio: regular rate and rhythm, S1, S2 normal, no murmur, click, rub or gallop GI: soft, non-tender; bowel sounds normal; no masses,  no organomegaly Extremities: extremities normal, atraumatic, no cyanosis or edema  ECOG PERFORMANCE STATUS: 1 - Symptomatic but completely ambulatory  Blood pressure 123/67, pulse 71, temperature 97.7 F (36.5 C), temperature source Tympanic, resp. rate 20, height 6' (1.829 m), weight 195 lb 12.8 oz (88.8 kg), SpO2 94 %.  LABORATORY DATA: Lab Results  Component Value Date   WBC 4.7 09/28/2020   HGB 11.3 (L) 09/28/2020   HCT 34.0 (L) 09/28/2020   MCV 87.0 09/28/2020   PLT 223 09/28/2020      Chemistry      Component Value Date/Time   NA 137 09/22/2020 1045   K 3.8 09/22/2020 1045   CL 99 09/22/2020 1045   CO2 27 09/22/2020 1045   BUN 6 (L) 09/22/2020 1045   CREATININE 0.92 09/22/2020 1045      Component Value Date/Time   CALCIUM 9.3 09/22/2020 1045   ALKPHOS 90 09/22/2020 1045   AST 9 (L) 09/22/2020 1045   ALT 12 09/22/2020 1045   BILITOT 0.4 09/22/2020 1045       RADIOGRAPHIC STUDIES: CT Chest W Contrast  Result Date: 09/07/2020 CLINICAL DATA:  Small-cell lung cancer.  Restaging. EXAM: CT CHEST, ABDOMEN, AND PELVIS WITH CONTRAST TECHNIQUE: Multidetector CT imaging of the chest, abdomen and pelvis was performed following the standard protocol during bolus administration of intravenous contrast. CONTRAST:  49mL OMNIPAQUE IOHEXOL 350 MG/ML SOLN COMPARISON:  CT  chest 06/29/2020. FINDINGS: CT CHEST FINDINGS Cardiovascular: The heart size is normal. No substantial pericardial effusion. Coronary artery calcification is evident. Mild atherosclerotic calcification is noted in the wall of the thoracic aorta. Mediastinum/Nodes: 11 mm right paratracheal node measured previously is 10 mm on image 18/2 today. No other  mediastinal lymphadenopathy. 19 mm right hilar node seen previously is 19 mm again today on 40/2. No left hilar lymphadenopathy. The esophagus has normal imaging features. There is no axillary lymphadenopathy. Lungs/Pleura: Emphysema with substantial bullous change noted again. 6 mm right upper lobe nodule measured previously is 6 mm again today (54/6). Right middle lobe nodule measured previously at 6 mm is 4 mm on 96/6 today. 3 mm right middle lobe nodule on 76/6 is similar. 2.6 x 1.7 cm lesion in the lingula on the prior study is decreased slightly to 1.9 x 1.7 cm today. 5 mm left lower lobe nodule seen previously has resolved in the interval. There is also a 8 x 5 mm right lower lobe nodule seen previously that has resolved. No new suspicious pulmonary nodule or mass. There is no evidence of pleural effusion. Musculoskeletal: No worrisome lytic or sclerotic osseous abnormality. Stable posterior right paramidline sebaceous cyst. CT ABDOMEN PELVIS FINDINGS Hepatobiliary: No suspicious focal abnormality within the liver parenchyma. Calcified gallstone again noted. No intrahepatic or extrahepatic biliary dilation. Pancreas: No focal mass lesion. No dilatation of the main duct. No intraparenchymal cyst. No peripancreatic edema. Spleen: No splenomegaly. No focal mass lesion. Adrenals/Urinary Tract: No adrenal nodule or mass. Small cyst again noted left kidney. Tiny cyst noted lower pole right kidney. No evidence for hydroureter. The urinary bladder appears normal for the degree of distention. Stomach/Bowel: Stomach is nondistended. Duodenum is normally positioned as is  the ligament of Treitz. No small bowel wall thickening. No small bowel dilatation. The terminal ileum is normal. The appendix is normal. No gross colonic mass. No colonic wall thickening. Vascular/Lymphatic: Insert calcium aorta There is no gastrohepatic or hepatoduodenal ligament lymphadenopathy. No retroperitoneal or mesenteric lymphadenopathy. No pelvic sidewall lymphadenopathy. Reproductive: The prostate gland and seminal vesicles are unremarkable. Other: No intraperitoneal free fluid. Musculoskeletal: Small right groin hernia contains only fat. No worrisome lytic or sclerotic osseous abnormality. IMPRESSION: 1. Bilateral pulmonary nodules are stable to resolved in the interval. 2. No substantial change in paratracheal and right hilar lymphadenopathy. 3. No new or progressive findings. 4. Cholelithiasis. 5. Aortic Atherosclerosis (ICD10-I70.0) and Emphysema (ICD10-J43.9). Electronically Signed   By: Misty Stanley [name]   On: 09/07/2020 11:02   CT Abdomen Pelvis W Contrast  Result Date: 09/07/2020 CLINICAL DATA:  Small-cell lung cancer.  Restaging. EXAM: CT CHEST, ABDOMEN, AND PELVIS WITH CONTRAST TECHNIQUE: Multidetector CT imaging of the chest, abdomen and pelvis was performed following the standard protocol during bolus administration of intravenous contrast. CONTRAST:  27mL OMNIPAQUE IOHEXOL 350 MG/ML SOLN COMPARISON:  CT chest 06/29/2020. FINDINGS: CT CHEST FINDINGS Cardiovascular: The heart size is normal. No substantial pericardial effusion. Coronary artery calcification is evident. Mild atherosclerotic calcification is noted in the wall of the thoracic aorta. Mediastinum/Nodes: 11 mm right paratracheal node measured previously is 10 mm on image 18/2 today. No other mediastinal lymphadenopathy. 19 mm right hilar node seen previously is 19 mm again today on 40/2. No left hilar lymphadenopathy. The esophagus has normal imaging features. There is no axillary lymphadenopathy. Lungs/Pleura: Emphysema with  substantial bullous change noted again. 6 mm right upper lobe nodule measured previously is 6 mm again today (54/6). Right middle lobe nodule measured previously at 6 mm is 4 mm on 96/6 today. 3 mm right middle lobe nodule on 76/6 is similar. 2.6 x 1.7 cm lesion in the lingula on the prior study is decreased slightly to 1.9 x 1.7 cm today. 5 mm left lower lobe nodule seen previously has resolved  in the interval. There is also a 8 x 5 mm right lower lobe nodule seen previously that has resolved. No new suspicious pulmonary nodule or mass. There is no evidence of pleural effusion. Musculoskeletal: No worrisome lytic or sclerotic osseous abnormality. Stable posterior right paramidline sebaceous cyst. CT ABDOMEN PELVIS FINDINGS Hepatobiliary: No suspicious focal abnormality within the liver parenchyma. Calcified gallstone again noted. No intrahepatic or extrahepatic biliary dilation. Pancreas: No focal mass lesion. No dilatation of the main duct. No intraparenchymal cyst. No peripancreatic edema. Spleen: No splenomegaly. No focal mass lesion. Adrenals/Urinary Tract: No adrenal nodule or mass. Small cyst again noted left kidney. Tiny cyst noted lower pole right kidney. No evidence for hydroureter. The urinary bladder appears normal for the degree of distention. Stomach/Bowel: Stomach is nondistended. Duodenum is normally positioned as is the ligament of Treitz. No small bowel wall thickening. No small bowel dilatation. The terminal ileum is normal. The appendix is normal. No gross colonic mass. No colonic wall thickening. Vascular/Lymphatic: Insert calcium aorta There is no gastrohepatic or hepatoduodenal ligament lymphadenopathy. No retroperitoneal or mesenteric lymphadenopathy. No pelvic sidewall lymphadenopathy. Reproductive: The prostate gland and seminal vesicles are unremarkable. Other: No intraperitoneal free fluid. Musculoskeletal: Small right groin hernia contains only fat. No worrisome lytic or sclerotic  osseous abnormality. IMPRESSION: 1. Bilateral pulmonary nodules are stable to resolved in the interval. 2. No substantial change in paratracheal and right hilar lymphadenopathy. 3. No new or progressive findings. 4. Cholelithiasis. 5. Aortic Atherosclerosis (ICD10-I70.0) and Emphysema (ICD10-J43.9). Electronically Signed   By: Misty Stanley [name]   On: 09/07/2020 11:02     ASSESSMENT AND PLAN: This is a very pleasant 70 years old white male recently diagnosed with limited stage small cell lung cancer (T3, N2, M0) presented with pleural-based pulmonary nodules in the right lung in addition to mediastinal lymphadenopathy in August 2021.  The patient was initially diagnosed with a pleural-based nodule status post SBRT in March 2021. He completed systemic chemotherapy with carboplatin for AUC of 5 from day 1 and to etoposide 100 mg/M2 on days 1, 2 and 3 with Neulasta support every 3 weeks.  Status post 4 cycles. The patient tolerated his treatment well except for fatigue. He also underwent palliative radiotherapy to enlarging right lung nodule under the care of Dr. Sondra Come. The patient is currently on observation.  He had repeat CT scan of the chest performed recently. I personally and independently reviewed the scan images and discussed the result and showed the images to the patient and his wife today. His scan showed new and enlarging bilateral pulmonary nodules and the enlarging right hilar lymph nodes consistent with metastatic disease. The patient is currently undergoing palliative systemic chemotherapy with carboplatin for AUC of 5 on day 1, etoposide 100 Mg/M2 on days 1, 2 and 3 with Cosela before the chemotherapy as well as Imfinzi 1500 Mg IV on day 1 every 3 weeks with the chemotherapy followed by maintenance every 4 weeks.  Status post 3 cycles.  Cosela was discontinued after cycle #1 secondary to phlebitis and the patient refused to continue with the treatment. The patient continues to tolerate his  treatment well except for the fatigue. I recommended for him to proceed with cycle #4 today as planned. I will see him back for follow-up visit in 3 weeks for evaluation with repeat CT scan of the chest, abdomen pelvis for restaging of his disease. For the hypokalemia, I will give him refill for potassium chloride supplement. The patient was advised to call  immediately if he has any other concerning symptoms in the interval.  The patient voices understanding of current disease status and treatment options and is in agreement with the current care plan.  All questions were answered. The patient knows to call the clinic with any problems, questions or concerns. We can certainly see the patient much sooner if necessary.  Disclaimer: This note was dictated with voice recognition software. Similar sounding words can inadvertently be transcribed and may not be corrected upon review.

## 2020-09-29 ENCOUNTER — Inpatient Hospital Stay: Payer: Medicare Other

## 2020-09-29 VITALS — BP 147/81 | HR 64 | Temp 98.8°F | Resp 18

## 2020-09-29 DIAGNOSIS — Z5111 Encounter for antineoplastic chemotherapy: Secondary | ICD-10-CM | POA: Diagnosis not present

## 2020-09-29 DIAGNOSIS — C3491 Malignant neoplasm of unspecified part of right bronchus or lung: Secondary | ICD-10-CM

## 2020-09-29 MED ORDER — SODIUM CHLORIDE 0.9 % IV SOLN: Freq: Once | INTRAVENOUS | Status: AC

## 2020-09-29 MED ORDER — SODIUM CHLORIDE 0.9 % IV SOLN
100.0000 mg/m2 | Freq: Once | INTRAVENOUS | Status: AC
  Administered 2020-09-29: 220 mg via INTRAVENOUS
  Filled 2020-09-29: qty 11

## 2020-09-29 MED ORDER — SODIUM CHLORIDE 0.9 % IV SOLN
10.0000 mg | Freq: Once | INTRAVENOUS | Status: AC
  Administered 2020-09-29: 10 mg via INTRAVENOUS
  Filled 2020-09-29: qty 10

## 2020-09-29 MED FILL — Dexamethasone Sodium Phosphate Inj 100 MG/10ML: INTRAMUSCULAR | Qty: 1 | Status: AC

## 2020-09-29 NOTE — Patient Instructions (Signed)
[facility]COL[facility]  Discharge Instructions: Thank you for choosing [facility] to provide your oncology and hematology care.   If you have a lab appointment with the Cancer Center, please go directly to the Cancer Center and check in at the registration area.   Wear comfortable clothing and clothing appropriate for easy access to any Portacath or PICC line.   We strive to give you quality time with your provider. You may need to reschedule your appointment if you arrive late (15 or more minutes).  Arriving late affects you and other patients whose appointments are after yours.  Also, if you miss three or more appointments without notifying the office, you may be dismissed from the clinic at the provider's discretion.      For prescription refill requests, have your pharmacy contact our office and allow 72 hours for refills to be completed.    Today you received the following chemotherapy and/or immunotherapy agents: etoposide.     To help prevent nausea and vomiting after your treatment, we encourage you to take your nausea medication as directed.  BELOW ARE SYMPTOMS THAT SHOULD BE REPORTED IMMEDIATELY: . *FEVER GREATER THAN 100.4 F (38 C) OR HIGHER . *CHILLS OR SWEATING . *NAUSEA AND VOMITING THAT IS NOT CONTROLLED WITH YOUR NAUSEA MEDICATION . *UNUSUAL SHORTNESS OF BREATH . *UNUSUAL BRUISING OR BLEEDING . *URINARY PROBLEMS (pain or burning when urinating, or frequent urination) . *BOWEL PROBLEMS (unusual diarrhea, constipation, pain near the anus) . TENDERNESS IN MOUTH AND THROAT WITH OR WITHOUT PRESENCE OF ULCERS (sore throat, sores in mouth, or a toothache) . UNUSUAL RASH, SWELLING OR PAIN  . UNUSUAL VAGINAL DISCHARGE OR ITCHING   Items with * indicate a potential emergency and should be followed up as soon as possible or go to the Emergency Department if any problems should occur.  Please show the CHEMOTHERAPY ALERT CARD or IMMUNOTHERAPY ALERT  CARD at check-in to the Emergency Department and triage nurse.  Should you have questions after your visit or need to cancel or reschedule your appointment, please contact [facility]  Dept: [phone]  and follow the prompts.  Office hours are 8:00 a.m. to 4:30 p.m. Monday - Friday. Please note that voicemails left after 4:00 p.m. may not be returned until the following business day.  We are closed weekends and major holidays. You have access to a nurse at all times for urgent questions. Please call the main number to the clinic Dept: [phone] and follow the prompts.   For any non-urgent questions, you may also contact your provider using MyChart. We now offer e-Visits for anyone 18 and older to request care online for non-urgent symptoms. For details visit [email or web address].   Also download the MyChart app! Go to the app store, search "MyChart", open the app, select [facility], and log in with your MyChart username and password.  Due to Covid, a mask is required upon entering the hospital/clinic. If you do not have a mask, one will be given to you upon arrival. For doctor visits, patients may have 1 support person aged 18 or older with them. For treatment visits, patients cannot have anyone with them due to current Covid guidelines and our immunocompromised population.   

## 2020-09-30 ENCOUNTER — Inpatient Hospital Stay: Payer: Medicare Other

## 2020-09-30 ENCOUNTER — Other Ambulatory Visit: Payer: Self-pay | Admitting: Medical Oncology

## 2020-09-30 ENCOUNTER — Other Ambulatory Visit: Payer: Self-pay

## 2020-09-30 VITALS — BP 121/66 | HR 64 | Temp 98.8°F | Resp 18

## 2020-09-30 DIAGNOSIS — C3491 Malignant neoplasm of unspecified part of right bronchus or lung: Secondary | ICD-10-CM

## 2020-09-30 DIAGNOSIS — Z5111 Encounter for antineoplastic chemotherapy: Secondary | ICD-10-CM | POA: Diagnosis not present

## 2020-09-30 MED ORDER — SODIUM CHLORIDE 0.9 % IV SOLN
100.0000 mg/m2 | Freq: Once | INTRAVENOUS | Status: AC
  Administered 2020-09-30: 220 mg via INTRAVENOUS
  Filled 2020-09-30: qty 11

## 2020-09-30 MED ORDER — SODIUM CHLORIDE 0.9 % IV SOLN
10.0000 mg | Freq: Once | INTRAVENOUS | Status: AC
  Administered 2020-09-30: 10 mg via INTRAVENOUS
  Filled 2020-09-30: qty 10

## 2020-09-30 MED ORDER — SODIUM CHLORIDE 0.9 % IV SOLN: Freq: Once | INTRAVENOUS | Status: AC

## 2020-09-30 NOTE — Patient Instructions (Signed)
[facility]  Discharge Instructions: Thank you for choosing [facility] to provide your oncology and hematology care.   If you have a lab appointment with the [facility], please go directly to the [facility] and check in at the registration area.   Wear comfortable clothing and clothing appropriate for easy access to any Portacath or PICC line.   We strive to give you quality time with your provider. You may need to reschedule your appointment if you arrive late (15 or more minutes).  Arriving late affects you and other patients whose appointments are after yours.  Also, if you miss three or more appointments without notifying the office, you may be dismissed from the clinic at the provider's discretion.      For prescription refill requests, have your pharmacy contact our office and allow 72 hours for refills to be completed.    Today you received the following chemotherapy and/or immunotherapy agents Etoposide      To help prevent nausea and vomiting after your treatment, we encourage you to take your nausea medication as directed.  BELOW ARE SYMPTOMS THAT SHOULD BE REPORTED IMMEDIATELY: *FEVER GREATER THAN 100.4 F (38 C) OR HIGHER *CHILLS OR SWEATING *NAUSEA AND VOMITING THAT IS NOT CONTROLLED WITH YOUR NAUSEA MEDICATION *UNUSUAL SHORTNESS OF BREATH *UNUSUAL BRUISING OR BLEEDING *URINARY PROBLEMS (pain or burning when urinating, or frequent urination) *BOWEL PROBLEMS (unusual diarrhea, constipation, pain near the anus) TENDERNESS IN MOUTH AND THROAT WITH OR WITHOUT PRESENCE OF ULCERS (sore throat, sores in mouth, or a toothache) UNUSUAL RASH, SWELLING OR PAIN  UNUSUAL VAGINAL DISCHARGE OR ITCHING   Items with * indicate a potential emergency and should be followed up as soon as possible or go to the Emergency Department if any problems should occur.  Please show the CHEMOTHERAPY ALERT CARD or IMMUNOTHERAPY ALERT CARD at check-in to  the Emergency Department and triage nurse.  Should you have questions after your visit or need to cancel or reschedule your appointment, please contact [facility]  Dept: [phone]  and follow the prompts.  Office hours are 8:00 a.m. to 4:30 p.m. Monday - Friday. Please note that voicemails left after 4:00 p.m. may not be returned until the following business day.  We are closed weekends and major holidays. You have access to a nurse at all times for urgent questions. Please call the main number to the clinic Dept: [phone] and follow the prompts.   For any non-urgent questions, you may also contact your provider using MyChart. We now offer e-Visits for anyone 51 and older to request care online for non-urgent symptoms. For details visit [email or web address].   Also download the MyChart app! Go to the app store, search "MyChart", open the app, select [facility], and log in with your MyChart username and password.  Due to Covid, a mask is required upon entering the hospital/clinic. If you do not have a mask, one will be given to you upon arrival. For doctor visits, patients may have 1 support person aged 22 or older with them. For treatment visits, patients cannot have anyone with them due to current Covid guidelines and our immunocompromised population.

## 2020-10-02 ENCOUNTER — Ambulatory Visit (HOSPITAL_COMMUNITY)
Admission: RE | Admit: 2020-10-02 | Discharge: 2020-10-02 | Disposition: A | Discharge status: Home / Self Care | Payer: Medicare Other | Source: Ambulatory Visit | Attending: Radiation Oncology | Admitting: Radiation Oncology

## 2020-10-02 ENCOUNTER — Ambulatory Visit (HOSPITAL_COMMUNITY): Payer: Medicare Other

## 2020-10-02 ENCOUNTER — Other Ambulatory Visit (HOSPITAL_COMMUNITY): Payer: Medicare Other

## 2020-10-02 DIAGNOSIS — C3491 Malignant neoplasm of unspecified part of right bronchus or lung: Secondary | ICD-10-CM | POA: Diagnosis present

## 2020-10-02 MED ORDER — GADOBUTROL 1 MMOL/ML IV SOLN
8.0000 mL | Freq: Once | INTRAVENOUS | Status: AC | PRN
  Administered 2020-10-02: 8 mL via INTRAVENOUS

## 2020-10-03 ENCOUNTER — Other Ambulatory Visit: Payer: Self-pay

## 2020-10-03 ENCOUNTER — Inpatient Hospital Stay: Payer: Medicare Other

## 2020-10-03 VITALS — BP 145/78 | HR 73 | Temp 98.1°F | Resp 18

## 2020-10-03 DIAGNOSIS — C3491 Malignant neoplasm of unspecified part of right bronchus or lung: Secondary | ICD-10-CM

## 2020-10-03 DIAGNOSIS — Z5111 Encounter for antineoplastic chemotherapy: Secondary | ICD-10-CM | POA: Diagnosis not present

## 2020-10-03 MED ORDER — PEGFILGRASTIM-BMEZ 6 MG/0.6ML ~~LOC~~ SOSY
PREFILLED_SYRINGE | SUBCUTANEOUS | Status: AC
  Administered 2020-10-03: 6 mg via SUBCUTANEOUS
  Filled 2020-10-03: qty 0.6

## 2020-10-03 MED ORDER — PEGFILGRASTIM-BMEZ 6 MG/0.6ML ~~LOC~~ SOSY: 6.0000 mg | PREFILLED_SYRINGE | Freq: Once | SUBCUTANEOUS | Status: AC

## 2020-10-03 NOTE — Progress Notes (Signed)
Radiation Oncology         [phone] ________________________________  Name: Jonathon [name] MRN: [id number]  Date: 10/05/2020  DOB: [birth year]  Follow-Up Visit Note  CC: Jonathon Limbo, MD  Jonathon Bears, MD    ICD-10-CM   1. Primary lung small cell carcinoma, right (HCC)  C34.91 MR Brain W Wo Contrast      Diagnosis: Limited stage (T3, N2, M0) small cell lung cancer presented with pulmonary nodules and pleural-based nodules in the right lung in addition to mediastinal lymphadenopathy diagnosed in August 2021  Interval Since Last Radiation:  4 months and 10 days   Intent: Curative  Radiation Treatment Dates: 05/18/2020 through 05/25/2020 Site Technique Total Dose (Gy) Dose per Fx (Gy) Completed Fx Beam Energies  Lung, Right: Lung_Rt IMRT 54/54 18 3/3 6XFFF    Narrative:  The patient returns today for routine follow-up, he was last seen by me for follow-up on 07/02/20. Shortly before our last follow-up visit, chest CT performed on 06/29/20 showed new and enlarging bilateral pulmonary nodules and the enlarging right hilar lymph nodes consistent with metastatic disease. Accordingly, the patient followed up with Dr. Julien [name] on 07/09/20 to discuss treatment options. Dr. Julien [name] discussed options with the patient including palliative care versus palliative systemic chemotherapy with carboplatin and etoposide, with Cosela before the chemotherapy, as well as Imfinzi every 3 weeks with chemotherapy followed by maintenance every 4 weeks. The patient expressed interest in proceeding with the systemic chemotherapy and received his first dose on 07/27/20.   During follow up with Dr. Julien [name] on 08/17/20, the patient reported increased fatigue and weakness following his first dose of chemotherapy.  He also reported baseline shortness of breath secondary to COPD.  The patient was seen recently by his cardiologist Dr. Einar [name] on 08/13/20 who advised him to consider palliative care and hospice based  on his other comorbidities.   CT of chest/abdomen/pelvis performed on 09/04/20 demonstrated the stable to resolved appearance of the bilateral pulmonary nodules. No substantial change was visualized in respects to the patients known paratracheal and right hilar lymphadenopathy. No new or progressive findings were otherwise seen.   The patient most recently followed up with Dr. Julien [name] on 09/28/20 prior to starting cycle 4 of his systemic treatment. The patient reported feeling well that day other than some complaints of fatigue which lasted for several days following cycle 3 of his treatment. He denied any current chest pain, shortness of breath, cough or hemoptysis at the time.  MRI of the brain performed on 10/02/20 revealed no metastatic disease or acute intracranial abnormalities. Advanced chronic small vessel disease was once again seen as on prior imaging, as well as chronic left anterior frontal lobe encephalomalacia.    PRIOR RADIATION:    Radiation Treatment Dates: 02/17/2020 through 02/28/2020 Site: Brain Technique: Complex Total Dose (Gy): 25/25 Dose per Fx (Gy): 2.5 Completed Fx: 10/10 Beam Energies: 6X  Radiation Treatment Dates: 03/13/2019 through 03/20/2019 Site Technique Total Dose (Gy) Dose per Fx (Gy) Completed Fx Beam Energies  Lung, Right: Lung_Rt IMRT 54/54 18 3/3 6XFFF    CURRENT SYSTEMIC THERAPY: Second line systemic chemotherapy with carboplatin for AUC of 5 on day 1, etoposide 100 Mg/M2 on days 1, 2 and 3 with Cosela before the chemotherapy in addition to Imfinzi 1500 Mg IV on day 1 every 3 weeks.  First dose July 27, 2020.                 Allergies:  is allergic to atorvastatin.  Meds: Current Outpatient Medications  Medication Sig Dispense Refill   albuterol (PROVENTIL) (2.5 MG/3ML) 0.083% nebulizer solution Take 2.5 mg by nebulization every 4 (four) hours as needed for wheezing or shortness of breath.     Albuterol Sulfate (PROAIR RESPICLICK) 030 (90 BASE)  MCG/ACT AEPB Inhale 2 puffs into the lungs 4 (four) times daily as needed. 1 each 5   amLODipine (NORVASC) 10 MG tablet TAKE 1 TABLET BY MOUTH ONCE A DAY 90 tablet 3   atorvastatin (LIPITOR) 20 MG tablet Take one tablet (20 mg dose) by mouth daily. 30 tablet 5   bisoprolol (ZEBETA) 5 MG tablet TAKE ONE (1) TABLET BY MOUTH EVERY DAY 90 tablet 3   cloNIDine (CATAPRES) 0.3 MG tablet TAKE 1 TABLET BY MOUTH TWICE DAILY 180 tablet 3   Cyanocobalamin (VITAMIN B 12 PO) Take 1 tablet by mouth every morning.      cyclobenzaprine (FLEXERIL) 5 MG tablet TAKE 1 TABLET BY MOUTH AT BEDTIME AS NEEDED FOR MUSCLE SPASMS 30 tablet 3   dabigatran (PRADAXA) 150 MG CAPS capsule Take 150 mg by mouth 2 (two) times daily.     DULoxetine (CYMBALTA) 30 MG capsule TAKE ONE CAPSULE BY MOUTH DAILY 90 capsule 3   gabapentin (NEURONTIN) 400 MG capsule TAKE 2 CAPSULES BY MOUTH THREE TIMES A DAY 180 capsule 7   hydrochlorothiazide (MICROZIDE) 12.5 MG capsule TAKE 1 CAPSULE (12.5 MG TOTAL) BY MOUTH DAILY. 90 capsule 2   lidocaine-prilocaine (EMLA) cream Apply 1 application topically as needed to port-a-cath site. 30 g 2   losartan (COZAAR) 100 MG tablet TAKE 1 TABLET (100 MG TOTAL) BY MOUTH DAILY. 90 tablet 3   metFORMIN (GLUCOPHAGE) 500 MG tablet TAKE 1 TABLET BY MOUTH ONCE A DAY WITH BREAKFAST 90 tablet 3   mupirocin cream (BACTROBAN) 2 % APPLY TOPICALLY 2 (TWO) TIMES DAILY. 15 g 0   nitroGLYCERIN (NITROSTAT) 0.4 MG SL tablet PLACE ONE TABLET (0.4 MG DOSE) UNDER THE TONGUE EVERY 5 (FIVE) MINUTES AS NEEDED. 25 tablet 5   nortriptyline (PAMELOR) 25 MG capsule TAKE 1 CAPSULE BY MOUTH EVERY NIGHT AT BEDTIME 90 capsule 0   pantoprazole (PROTONIX) 20 MG tablet TAKE 1 TABLET BY MOUTH DAILY 90 tablet 2   potassium chloride SA (KLOR-CON) 20 MEQ tablet Take 1 tablet (20 mEq total) by mouth daily. 10 tablet 0   prochlorperazine (COMPAZINE) 10 MG tablet Take 1 tablet (10 mg total) by mouth every 6 (six) hours as needed for nausea or  vomiting. 30 tablet 0   SYMBICORT 160-4.5 MCG/ACT inhaler INHALE 2 PUFFS INTO THE LUNGS TWICE DAILY 10.2 g 11   tamsulosin (FLOMAX) 0.4 MG CAPS capsule TAKE 1 CAPSULE BY MOUTH DAILY 90 capsule 3   temazepam (RESTORIL) 15 MG capsule Take by mouth.     Tiotropium Bromide Monohydrate (SPIRIVA RESPIMAT) 2.5 MCG/ACT AERS Inhale two puffs into the lungs as needed. 4 g 11   No current facility-administered medications for this encounter.    Physical Findings: The patient is in no acute distress. Patient is alert and oriented.  height is 6' (1.829 m) and weight is 199 lb 9.6 oz (90.5 kg). His temperature is 97.9 F (36.6 C). His blood pressure is 122/58 (abnormal) and his pulse is 68. His respiration is 24 (abnormal) and oxygen saturation is 97%. .  No significant changes. Lungs are clear to auscultation bilaterally. Heart has regular rate and rhythm. No palpable cervical, supraclavicular, or axillary adenopathy. Abdomen soft, non-tender, normal bowel sounds.  The neurological  examination is nonfocal.   Lab Findings: Lab Results  Component Value Date   WBC 17.7 (H) 10/05/2020   HGB 10.4 (L) 10/05/2020   HCT 30.3 (L) 10/05/2020   MCV 85.6 10/05/2020   PLT 138 (L) 10/05/2020    Radiographic Findings: MR Brain W Wo Contrast  Result Date: 10/04/2020 CLINICAL DATA:  70 year old male with limited stage small cell lung cancer. Restaging. EXAM: MRI HEAD WITHOUT AND WITH CONTRAST TECHNIQUE: Multiplanar, multiecho pulse sequences of the brain and surrounding structures were obtained without and with intravenous contrast. CONTRAST:  7mL GADAVIST GADOBUTROL 1 MMOL/ML IV SOLN COMPARISON:  Brain MRI 06/29/2020 and earlier. FINDINGS: Brain: Chronically advanced, Patchy and confluent bilateral cerebral white matter T2 and FLAIR hyperintensity with scattered cystic areas of encephalomalacia compatible with a combination of chronic lacunar infarcts and perivascular spaces. Stable chronic cortical encephalomalacia  in the anterior left frontal lobe. Stable T2 heterogeneity in the pons. There is chronic hemorrhage in the left midbrain and dorsal brainstem. Small chronic microhemorrhages in the deep cerebellar nuclei are stable. Occasional chronic microhemorrhages are also suspected in the deep gray nuclei (right thalamus series 9, image 27) and may have mildly progressed since 2021. No other cortical encephalomalacia identified. No midline shift, mass effect, or evidence of intracranial mass lesion. No abnormal enhancement identified. No dural thickening identified. No restricted diffusion to suggest acute infarction. No midline shift, mass effect, evidence of mass lesion, ventriculomegaly, extra-axial collection or acute intracranial hemorrhage. Cervicomedullary junction and pituitary are within normal limits. Stable cerebral volume. Vascular: Major intracranial vascular flow voids are stable. Generalized anterior circulation ectasia, tortuosity. The major dural venous sinuses are enhancing and appear to be patent. Skull and upper cervical spine: Negative visible cervical spine. Normal visible bone marrow signal. Sinuses/Orbits: Stable, negative. Other: Trace right mastoid effusion is new. Negative visible nasopharynx. Other visible internal auditory structures appear normal. Negative visible scalp and face. IMPRESSION: 1. No metastatic disease or acute intracranial abnormality. 2. Advanced chronic small vessel disease. Chronic left anterior frontal lobe encephalomalacia. Electronically Signed   By: Genevie Ann [name]   On: 10/04/2020 07:52    Impression:  Limited stage (T3, N2, M0) small cell lung cancer presented with pulmonary nodules and pleural-based nodules in the right lung in addition to mediastinal lymphadenopathy diagnosed in August 2021  Recent brain MRI shows no involvement in this area.  The patient continues on chemotherapy for his chest recurrence.  Plan: MRI of the brain in 3 months, follow-up soon  afterward.   15 minutes of total time was spent for this patient encounter, including preparation, face-to-face counseling with the patient and coordination of care, physical exam, and documentation of the encounter. ____________________________________  Blair Promise, PhD, MD   This document serves as a record of services personally performed by Gery Pray, MD. It was created on his behalf by Roney Mans, a trained medical scribe. The creation of this record is based on the scribe's personal observations and the provider's statements to them. This document has been checked and approved by the attending provider.

## 2020-10-05 ENCOUNTER — Inpatient Hospital Stay: Payer: Medicare Other

## 2020-10-05 ENCOUNTER — Ambulatory Visit
Admission: RE | Admit: 2020-10-05 | Discharge: 2020-10-05 | Disposition: A | Discharge status: Home / Self Care | Payer: Medicare Other | Source: Ambulatory Visit | Attending: Radiation Oncology | Admitting: Radiation Oncology

## 2020-10-05 ENCOUNTER — Other Ambulatory Visit: Payer: Self-pay

## 2020-10-05 ENCOUNTER — Encounter: Payer: Self-pay | Admitting: Radiation Oncology

## 2020-10-05 DIAGNOSIS — R531 Weakness: Secondary | ICD-10-CM | POA: Insufficient documentation

## 2020-10-05 DIAGNOSIS — G9389 Other specified disorders of brain: Secondary | ICD-10-CM | POA: Diagnosis not present

## 2020-10-05 DIAGNOSIS — J449 Chronic obstructive pulmonary disease, unspecified: Secondary | ICD-10-CM | POA: Diagnosis not present

## 2020-10-05 DIAGNOSIS — Z79899 Other long term (current) drug therapy: Secondary | ICD-10-CM | POA: Diagnosis not present

## 2020-10-05 DIAGNOSIS — R5383 Other fatigue: Secondary | ICD-10-CM | POA: Diagnosis not present

## 2020-10-05 DIAGNOSIS — C3491 Malignant neoplasm of unspecified part of right bronchus or lung: Secondary | ICD-10-CM

## 2020-10-05 DIAGNOSIS — Z923 Personal history of irradiation: Secondary | ICD-10-CM | POA: Insufficient documentation

## 2020-10-05 DIAGNOSIS — Z5111 Encounter for antineoplastic chemotherapy: Secondary | ICD-10-CM | POA: Diagnosis not present

## 2020-10-05 DIAGNOSIS — C3431 Malignant neoplasm of lower lobe, right bronchus or lung: Secondary | ICD-10-CM | POA: Diagnosis present

## 2020-10-05 DIAGNOSIS — Z7901 Long term (current) use of anticoagulants: Secondary | ICD-10-CM | POA: Insufficient documentation

## 2020-10-05 DIAGNOSIS — Z7984 Long term (current) use of oral hypoglycemic drugs: Secondary | ICD-10-CM | POA: Diagnosis not present

## 2020-10-05 LAB — CBC WITH DIFFERENTIAL (CANCER CENTER ONLY)
Abs Immature Granulocytes: 0 10*3/uL (ref 0.00–0.07)
Band Neutrophils: 2 %
Basophils Absolute: 0 10*3/uL (ref 0.0–0.1)
Basophils Relative: 0 %
Eosinophils Absolute: 0 10*3/uL (ref 0.0–0.5)
Eosinophils Relative: 0 %
HCT: 30.3 % — ABNORMAL LOW (ref 39.0–52.0)
Hemoglobin: 10.4 g/dL — ABNORMAL LOW (ref 13.0–17.0)
Lymphocytes Relative: 8 %
Lymphs Abs: 1.4 10*3/uL (ref 0.7–4.0)
MCH: 29.4 pg (ref 26.0–34.0)
MCHC: 34.3 g/dL (ref 30.0–36.0)
MCV: 85.6 fL (ref 80.0–100.0)
Monocytes Absolute: 0.2 10*3/uL (ref 0.1–1.0)
Monocytes Relative: 1 %
Neutro Abs: 16.1 10*3/uL — ABNORMAL HIGH (ref 1.7–7.7)
Neutrophils Relative %: 89 %
Platelet Count: 138 10*3/uL — ABNORMAL LOW (ref 150–400)
RBC: 3.54 MIL/uL — ABNORMAL LOW (ref 4.22–5.81)
RDW: 18.1 % — ABNORMAL HIGH (ref 11.5–15.5)
WBC Count: 17.7 10*3/uL — ABNORMAL HIGH (ref 4.0–10.5)
nRBC: 0 % (ref 0.0–0.2)

## 2020-10-05 LAB — TSH: TSH: 0.722 u[IU]/mL (ref 0.320–4.118)

## 2020-10-05 LAB — CMP (CANCER CENTER ONLY)
ALT: 13 U/L (ref 0–44)
AST: 10 U/L — ABNORMAL LOW (ref 15–41)
Albumin: 3.6 g/dL (ref 3.5–5.0)
Alkaline Phosphatase: 107 U/L (ref 38–126)
Anion gap: 9 (ref 5–15)
BUN: 12 mg/dL (ref 8–23)
CO2: 30 mmol/L (ref 22–32)
Calcium: 9.4 mg/dL (ref 8.9–10.3)
Chloride: 96 mmol/L — ABNORMAL LOW (ref 98–111)
Creatinine: 0.83 mg/dL (ref 0.61–1.24)
GFR, Estimated: >60 mL/min (ref >60)
Glucose, Bld: 128 mg/dL — ABNORMAL HIGH (ref 70–99)
Potassium: 3.5 mmol/L (ref 3.5–5.1)
Sodium: 135 mmol/L (ref 135–145)
Total Bilirubin: 0.8 mg/dL (ref 0.3–1.2)
Total Protein: 7.3 g/dL (ref 6.5–8.1)

## 2020-10-05 NOTE — Progress Notes (Signed)
Jonathon [name] is here today for follow up post radiation to the lung.  Lung Side: Right, patient completed radiation treatment on 05/25/20.  Does the patient complain of any of the following: Pain:Patient denies pain.  Shortness of breath w/wo exertion: Reports shortness of breath on exertion. Cough: no Hemoptysis: no Pain with swallowing: at times Swallowing/choking concerns: reports getting choked on foods at times.  Appetite: Fair  Wt Readings from Last 3 Encounters:  10/05/20 199 lb 9.6 oz (90.5 kg)  09/28/20 195 lb 12.8 oz (88.8 kg)  09/09/20 200 lb 8 oz (90.9 kg)    Energy Level: reports having a low energy level.  Post radiation skin Changes: no    Additional comments if applicable:  Vitals:   37/44/51 1005  BP: (!) 122/58  Pulse: 68  Resp: (!) 24  Temp: 97.9 F (36.6 C)  SpO2: 97%  Weight: 199 lb 9.6 oz (90.5 kg)  Height: 6' (1.829 m)

## 2020-10-07 ENCOUNTER — Ambulatory Visit (HOSPITAL_COMMUNITY): Payer: Medicare Other

## 2020-10-07 ENCOUNTER — Other Ambulatory Visit (HOSPITAL_COMMUNITY): Payer: Medicare Other

## 2020-10-12 ENCOUNTER — Other Ambulatory Visit: Payer: Self-pay | Admitting: Internal Medicine

## 2020-10-12 ENCOUNTER — Other Ambulatory Visit (HOSPITAL_COMMUNITY): Payer: Self-pay

## 2020-10-12 ENCOUNTER — Other Ambulatory Visit: Payer: Self-pay

## 2020-10-12 ENCOUNTER — Inpatient Hospital Stay: Payer: Medicare Other

## 2020-10-12 DIAGNOSIS — Z5111 Encounter for antineoplastic chemotherapy: Secondary | ICD-10-CM | POA: Diagnosis not present

## 2020-10-12 DIAGNOSIS — C3491 Malignant neoplasm of unspecified part of right bronchus or lung: Secondary | ICD-10-CM

## 2020-10-12 LAB — CBC WITH DIFFERENTIAL (CANCER CENTER ONLY)
Abs Immature Granulocytes: 1.52 10*3/uL — ABNORMAL HIGH (ref 0.00–0.07)
Basophils Absolute: 0.1 10*3/uL (ref 0.0–0.1)
Basophils Relative: 0 %
Eosinophils Absolute: 0 10*3/uL (ref 0.0–0.5)
Eosinophils Relative: 0 %
HCT: 29.4 % — ABNORMAL LOW (ref 39.0–52.0)
Hemoglobin: 9.7 g/dL — ABNORMAL LOW (ref 13.0–17.0)
Immature Granulocytes: 10 %
Lymphocytes Relative: 13 %
Lymphs Abs: 1.9 10*3/uL (ref 0.7–4.0)
MCH: 29 pg (ref 26.0–34.0)
MCHC: 33 g/dL (ref 30.0–36.0)
MCV: 88 fL (ref 80.0–100.0)
Monocytes Absolute: 2.4 10*3/uL — ABNORMAL HIGH (ref 0.1–1.0)
Monocytes Relative: 15 %
Neutro Abs: 9.5 10*3/uL — ABNORMAL HIGH (ref 1.7–7.7)
Neutrophils Relative %: 62 %
Platelet Count: 67 10*3/uL — ABNORMAL LOW (ref 150–400)
RBC: 3.34 MIL/uL — ABNORMAL LOW (ref 4.22–5.81)
RDW: 19.8 % — ABNORMAL HIGH (ref 11.5–15.5)
Smear Review: DECREASED
WBC Count: 15.4 10*3/uL — ABNORMAL HIGH (ref 4.0–10.5)
nRBC: 0.4 % — ABNORMAL HIGH (ref 0.0–0.2)

## 2020-10-12 LAB — CMP (CANCER CENTER ONLY)
ALT: 13 U/L (ref 0–44)
AST: 13 U/L — ABNORMAL LOW (ref 15–41)
Albumin: 3.5 g/dL (ref 3.5–5.0)
Alkaline Phosphatase: 113 U/L (ref 38–126)
Anion gap: 11 (ref 5–15)
BUN: 5 mg/dL — ABNORMAL LOW (ref 8–23)
CO2: 26 mmol/L (ref 22–32)
Calcium: 9.1 mg/dL (ref 8.9–10.3)
Chloride: 97 mmol/L — ABNORMAL LOW (ref 98–111)
Creatinine: 0.93 mg/dL (ref 0.61–1.24)
GFR, Estimated: >60 mL/min (ref >60)
Glucose, Bld: 142 mg/dL — ABNORMAL HIGH (ref 70–99)
Potassium: 3.1 mmol/L — ABNORMAL LOW (ref 3.5–5.1)
Sodium: 134 mmol/L — ABNORMAL LOW (ref 135–145)
Total Bilirubin: 0.3 mg/dL (ref 0.3–1.2)
Total Protein: 7 g/dL (ref 6.5–8.1)

## 2020-10-12 MED ORDER — POTASSIUM CHLORIDE CRYS ER 20 MEQ PO TBCR
20.0000 meq | EXTENDED_RELEASE_TABLET | Freq: Every day | ORAL | 0 refills | Status: DC
  Filled 2020-10-12: qty 10, 10d supply, fill #0

## 2020-10-19 ENCOUNTER — Other Ambulatory Visit: Payer: Self-pay | Admitting: Medical Oncology

## 2020-10-19 ENCOUNTER — Ambulatory Visit (HOSPITAL_COMMUNITY)
Admission: RE | Admit: 2020-10-19 | Discharge: 2020-10-19 | Disposition: A | Discharge status: Home / Self Care | Payer: Medicare Other | Source: Ambulatory Visit | Attending: Internal Medicine | Admitting: Internal Medicine

## 2020-10-19 ENCOUNTER — Other Ambulatory Visit: Payer: Self-pay

## 2020-10-19 DIAGNOSIS — C3491 Malignant neoplasm of unspecified part of right bronchus or lung: Secondary | ICD-10-CM

## 2020-10-19 DIAGNOSIS — C3490 Malignant neoplasm of unspecified part of unspecified bronchus or lung: Secondary | ICD-10-CM

## 2020-10-19 MED ORDER — IOHEXOL 350 MG/ML SOLN
80.0000 mL | Freq: Once | INTRAVENOUS | Status: AC | PRN
  Administered 2020-10-19: 80 mL via INTRAVENOUS

## 2020-10-20 ENCOUNTER — Inpatient Hospital Stay: Payer: Medicare Other

## 2020-10-20 ENCOUNTER — Encounter: Payer: Self-pay | Admitting: Internal Medicine

## 2020-10-20 ENCOUNTER — Inpatient Hospital Stay: Payer: Medicare Other | Attending: Internal Medicine | Admitting: Internal Medicine

## 2020-10-20 VITALS — BP 120/59 | HR 70 | Temp 97.5°F | Resp 19 | Ht 72.0 in | Wt 199.7 lb

## 2020-10-20 DIAGNOSIS — C3491 Malignant neoplasm of unspecified part of right bronchus or lung: Secondary | ICD-10-CM | POA: Diagnosis not present

## 2020-10-20 DIAGNOSIS — Z79899 Other long term (current) drug therapy: Secondary | ICD-10-CM | POA: Insufficient documentation

## 2020-10-20 DIAGNOSIS — C773 Secondary and unspecified malignant neoplasm of axilla and upper limb lymph nodes: Secondary | ICD-10-CM | POA: Diagnosis not present

## 2020-10-20 DIAGNOSIS — R0609 Other forms of dyspnea: Secondary | ICD-10-CM | POA: Diagnosis not present

## 2020-10-20 DIAGNOSIS — Z923 Personal history of irradiation: Secondary | ICD-10-CM | POA: Diagnosis not present

## 2020-10-20 DIAGNOSIS — Z5112 Encounter for antineoplastic immunotherapy: Secondary | ICD-10-CM | POA: Diagnosis not present

## 2020-10-20 DIAGNOSIS — R062 Wheezing: Secondary | ICD-10-CM | POA: Insufficient documentation

## 2020-10-20 DIAGNOSIS — C3431 Malignant neoplasm of lower lobe, right bronchus or lung: Secondary | ICD-10-CM | POA: Insufficient documentation

## 2020-10-20 DIAGNOSIS — R5383 Other fatigue: Secondary | ICD-10-CM | POA: Diagnosis not present

## 2020-10-20 LAB — CMP (CANCER CENTER ONLY)
ALT: 9 U/L (ref 0–44)
AST: 9 U/L — ABNORMAL LOW (ref 15–41)
Albumin: 3.3 g/dL — ABNORMAL LOW (ref 3.5–5.0)
Alkaline Phosphatase: 87 U/L (ref 38–126)
Anion gap: 12 (ref 5–15)
BUN: 5 mg/dL — ABNORMAL LOW (ref 8–23)
CO2: 25 mmol/L (ref 22–32)
Calcium: 9.2 mg/dL (ref 8.9–10.3)
Chloride: 98 mmol/L (ref 98–111)
Creatinine: 0.97 mg/dL (ref 0.61–1.24)
GFR, Estimated: >60 mL/min (ref >60)
Glucose, Bld: 207 mg/dL — ABNORMAL HIGH (ref 70–99)
Potassium: 3.5 mmol/L (ref 3.5–5.1)
Sodium: 135 mmol/L (ref 135–145)
Total Bilirubin: 0.3 mg/dL (ref 0.3–1.2)
Total Protein: 7.2 g/dL (ref 6.5–8.1)

## 2020-10-20 LAB — CBC WITH DIFFERENTIAL (CANCER CENTER ONLY)
Abs Immature Granulocytes: 0.07 10*3/uL (ref 0.00–0.07)
Basophils Absolute: 0 10*3/uL (ref 0.0–0.1)
Basophils Relative: 0 %
Eosinophils Absolute: 0 10*3/uL (ref 0.0–0.5)
Eosinophils Relative: 0 %
HCT: 30.6 % — ABNORMAL LOW (ref 39.0–52.0)
Hemoglobin: 10.1 g/dL — ABNORMAL LOW (ref 13.0–17.0)
Immature Granulocytes: 1 %
Lymphocytes Relative: 16 %
Lymphs Abs: 1.3 10*3/uL (ref 0.7–4.0)
MCH: 29 pg (ref 26.0–34.0)
MCHC: 33 g/dL (ref 30.0–36.0)
MCV: 87.9 fL (ref 80.0–100.0)
Monocytes Absolute: 1 10*3/uL (ref 0.1–1.0)
Monocytes Relative: 12 %
Neutro Abs: 5.9 10*3/uL (ref 1.7–7.7)
Neutrophils Relative %: 71 %
Platelet Count: 281 10*3/uL (ref 150–400)
RBC: 3.48 MIL/uL — ABNORMAL LOW (ref 4.22–5.81)
RDW: 20.3 % — ABNORMAL HIGH (ref 11.5–15.5)
WBC Count: 8.3 10*3/uL (ref 4.0–10.5)
nRBC: 0 % (ref 0.0–0.2)

## 2020-10-20 LAB — TSH: TSH: 1.171 u[IU]/mL (ref 0.320–4.118)

## 2020-10-20 MED ORDER — SODIUM CHLORIDE 0.9 % IV SOLN
1500.0000 mg | Freq: Once | INTRAVENOUS | Status: AC
  Administered 2020-10-20: 1500 mg via INTRAVENOUS
  Filled 2020-10-20: qty 30

## 2020-10-20 MED ORDER — SODIUM CHLORIDE 0.9 % IV SOLN: Freq: Once | INTRAVENOUS | Status: AC

## 2020-10-20 NOTE — Addendum Note (Signed)
Addended by: Claretha Cooper on: 10/20/2020 10:09 AM   Modules accepted: Orders

## 2020-10-20 NOTE — Patient Instructions (Signed)
[facility]  Discharge Instructions: Thank you for choosing [facility] to provide your oncology and hematology care.   If you have a lab appointment with the Cancer Center, please go directly to the Cancer Center and check in at the registration area.   Wear comfortable clothing and clothing appropriate for easy access to any Portacath or PICC line.   We strive to give you quality time with your provider. You may need to reschedule your appointment if you arrive late (15 or more minutes).  Arriving late affects you and other patients whose appointments are after yours.  Also, if you miss three or more appointments without notifying the office, you may be dismissed from the clinic at the provider's discretion.      For prescription refill requests, have your pharmacy contact our office and allow 72 hours for refills to be completed.    Today you received the following chemotherapy and/or immunotherapy agents Imfinzi      To help prevent nausea and vomiting after your treatment, we encourage you to take your nausea medication as directed.  BELOW ARE SYMPTOMS THAT SHOULD BE REPORTED IMMEDIATELY: *FEVER GREATER THAN 100.4 F (38 C) OR HIGHER *CHILLS OR SWEATING *NAUSEA AND VOMITING THAT IS NOT CONTROLLED WITH YOUR NAUSEA MEDICATION *UNUSUAL SHORTNESS OF BREATH *UNUSUAL BRUISING OR BLEEDING *URINARY PROBLEMS (pain or burning when urinating, or frequent urination) *BOWEL PROBLEMS (unusual diarrhea, constipation, pain near the anus) TENDERNESS IN MOUTH AND THROAT WITH OR WITHOUT PRESENCE OF ULCERS (sore throat, sores in mouth, or a toothache) UNUSUAL RASH, SWELLING OR PAIN  UNUSUAL VAGINAL DISCHARGE OR ITCHING   Items with * indicate a potential emergency and should be followed up as soon as possible or go to the Emergency Department if any problems should occur.  Please show the CHEMOTHERAPY ALERT CARD or IMMUNOTHERAPY ALERT CARD at check-in to the  Emergency Department and triage nurse.  Should you have questions after your visit or need to cancel or reschedule your appointment, please contact [facility]  Dept: [phone]  and follow the prompts.  Office hours are 8:00 a.m. to 4:30 p.m. Monday - Friday. Please note that voicemails left after 4:00 p.m. may not be returned until the following business day.  We are closed weekends and major holidays. You have access to a nurse at all times for urgent questions. Please call the main number to the clinic Dept: [phone] and follow the prompts.   For any non-urgent questions, you may also contact your provider using MyChart. We now offer e-Visits for anyone 18 and older to request care online for non-urgent symptoms. For details visit [email or web address].   Also download the MyChart app! Go to the app store, search "MyChart", open the app, select [facility], and log in with your MyChart username and password.  Due to Covid, a mask is required upon entering the hospital/clinic. If you do not have a mask, one will be given to you upon arrival. For doctor visits, patients may have 1 support person aged 18 or older with them. For treatment visits, patients cannot have anyone with them due to current Covid guidelines and our immunocompromised population.   

## 2020-10-20 NOTE — Progress Notes (Signed)
[facility] [phone]   Fax:([phone]) [phone]  OFFICE PROGRESS NOTE  [name], MD [address] [place] [address]  DIAGNOSIS: Relapsed small cell lung cancer initially diagnosed as limited stage (T3, N2, M0) small cell lung cancer presented with pulmonary nodules and pleural-based nodules in the right lung in addition to mediastinal lymphadenopathy diagnosed in August 2021.  The patient was previously treated without biopsy to a pleural-based nodule in the right lower lobe with SBRT in March 2021 but had evidence for disease recurrence and progression.  He has disease relapse in June 2022.  PRIOR THERAPY:  1) SBRT to right lower lobe pulmonary nodule in March 2021 under the care of Dr. Sondra Come. 2) Systemic chemotherapy with carboplatin for AUC of 5 on day 1 and etoposide 100 mg/M2 on days 1, 2 and 3 with Neulasta support every 3 weeks.  First cycle 10/07/2019.  Status post 4 cycles.  Last dose of the chemotherapy was given on December 09, 2019.  CURRENT THERAPY: Second line systemic chemotherapy with carboplatin for AUC of 5 on day 1, etoposide 100 Mg/M2 on days 1, 2 and 3 with Cosela before the chemotherapy in addition to Imfinzi 1500 Mg IV on day 1 every 3 weeks.  First dose July 27, 2020.  Status post 4 cycles.  Cosela was discontinued secondary to phlebitis.  Starting from cycle #5 the patient will be on maintenance treatment with Imfinzi 1500 Mg IV every 4 weeks.  INTERVAL HISTORY: Jonathon [name] 70 y.o. male returns to the clinic today for follow-up visit accompanied by his wife.  The patient is feeling fine today with no concerning complaints except for mild fatigue few days after the chemotherapy.  He denied having any current chest pain but has shortness of breath with exertion with no cough or hemoptysis.  He denied having any recent weight loss or night sweats.  He has no nausea, vomiting, diarrhea or constipation.  He denied  having any headache or visual changes.  He had repeat CT scan of the chest, abdomen pelvis performed recently and he is here for evaluation and discussion of his scan results before starting cycle #5 of his treatment.  MEDICAL HISTORY: Past Medical History:  Diagnosis Date   Cancer ([place])    CHF (congestive heart failure) (HCC)    Chronic cough    COPD, severe (HCC)    PT DENIES REGULAR DAILY INHALER ANY PRN   Coronary artery disease    CVA (cerebral vascular accident) ([place]) 02/22/2011   LEFT BRAIN STEM PONTINE HEMORRHAGE--  RIGHT SIDED WEAKNESS   Diabetes mellitus without complication (HCC)    type 2   Dyspnea    GERD (gastroesophageal reflux disease)    Harsh voice quality    PT STATES NORMAL FOR HIM   High cholesterol    History of CHF (congestive heart failure) MONITORED BY DR Coletta Memos   DIASTOLIC   History of radiation therapy 03/13/19-03/20/19   SBRT Right Lung; Dr. Gery Pray   History of radiation therapy 02/17/20-02/28/20   Whole brain radiation; Dr. Gery Pray   History of radiation therapy 05/25/2020   05/18/2020-05/25/2020  SBRT Right Lung; Dr. Gery Pray   Hydrocele, left    Hypertension    NSTEMI (non-ST elevated myocardial infarction) ([place]) 12/2015   Pre-operative clearance    GIVEN BY DR Coletta Memos   Short of breath on exertion    Smoker    Weakness of right side of  body    S/P CVA   FEB 2013    ALLERGIES:  is allergic to atorvastatin.  MEDICATIONS:  Current Outpatient Medications  Medication Sig Dispense Refill   albuterol (PROVENTIL) (2.5 MG/3ML) 0.083% nebulizer solution Take 2.5 mg by nebulization every 4 (four) hours as needed for wheezing or shortness of breath.     Albuterol Sulfate (PROAIR RESPICLICK) 010 (90 BASE) MCG/ACT AEPB Inhale 2 puffs into the lungs 4 (four) times daily as needed. 1 each 5   amLODipine (NORVASC) 10 MG tablet TAKE 1 TABLET BY MOUTH ONCE A DAY 90 tablet 3   atorvastatin (LIPITOR) 20 MG tablet Take one tablet (20 mg dose) by  mouth daily. 30 tablet 5   bisoprolol (ZEBETA) 5 MG tablet TAKE ONE (1) TABLET BY MOUTH EVERY DAY 90 tablet 3   cloNIDine (CATAPRES) 0.3 MG tablet TAKE 1 TABLET BY MOUTH TWICE DAILY 180 tablet 3   Cyanocobalamin (VITAMIN B 12 PO) Take 1 tablet by mouth every morning.      cyclobenzaprine (FLEXERIL) 5 MG tablet TAKE 1 TABLET BY MOUTH AT BEDTIME AS NEEDED FOR MUSCLE SPASMS 30 tablet 3   dabigatran (PRADAXA) 150 MG CAPS capsule Take 150 mg by mouth 2 (two) times daily.     DULoxetine (CYMBALTA) 30 MG capsule TAKE ONE CAPSULE BY MOUTH DAILY 90 capsule 3   gabapentin (NEURONTIN) 400 MG capsule TAKE 2 CAPSULES BY MOUTH THREE TIMES A DAY 180 capsule 7   hydrochlorothiazide (MICROZIDE) 12.5 MG capsule TAKE 1 CAPSULE (12.5 MG TOTAL) BY MOUTH DAILY. 90 capsule 2   lidocaine-prilocaine (EMLA) cream Apply 1 application topically as needed to port-a-cath site. 30 g 2   losartan (COZAAR) 100 MG tablet TAKE 1 TABLET (100 MG TOTAL) BY MOUTH DAILY. 90 tablet 3   metFORMIN (GLUCOPHAGE) 500 MG tablet TAKE 1 TABLET BY MOUTH ONCE A DAY WITH BREAKFAST 90 tablet 3   mupirocin cream (BACTROBAN) 2 % APPLY TOPICALLY 2 (TWO) TIMES DAILY. 15 g 0   nitroGLYCERIN (NITROSTAT) 0.4 MG SL tablet PLACE ONE TABLET (0.4 MG DOSE) UNDER THE TONGUE EVERY 5 (FIVE) MINUTES AS NEEDED. 25 tablet 5   nortriptyline (PAMELOR) 25 MG capsule TAKE 1 CAPSULE BY MOUTH EVERY NIGHT AT BEDTIME 90 capsule 0   pantoprazole (PROTONIX) 20 MG tablet TAKE 1 TABLET BY MOUTH DAILY 90 tablet 2   potassium chloride SA (KLOR-CON) 20 MEQ tablet Take 1 tablet by mouth once daily. 10 tablet 0   prochlorperazine (COMPAZINE) 10 MG tablet Take 1 tablet (10 mg total) by mouth every 6 (six) hours as needed for nausea or vomiting. 30 tablet 0   SYMBICORT 160-4.5 MCG/ACT inhaler INHALE 2 PUFFS INTO THE LUNGS TWICE DAILY 10.2 g 11   tamsulosin (FLOMAX) 0.4 MG CAPS capsule TAKE 1 CAPSULE BY MOUTH DAILY 90 capsule 3   temazepam (RESTORIL) 15 MG capsule Take by mouth.      Tiotropium Bromide Monohydrate (SPIRIVA RESPIMAT) 2.5 MCG/ACT AERS Inhale two puffs into the lungs as needed. 4 g 11   No current facility-administered medications for this visit.    SURGICAL HISTORY:  Past Surgical History:  Procedure Laterality Date   ABDOMINAL ANGIOGRAM  09/04/2012   Procedure: ABDOMINAL ANGIOGRAM;  Surgeon: Laverda Page, MD;  Location: [facility] CATH LAB;  Service: Cardiovascular;;   CARDIAC CATHETERIZATION  05-02-2011  DR Johnsie Cancel   MILD NONOBSTRUCTIVE CAD/ LAD, OM , AND D1/ EF 60^   CARDIAC CATHETERIZATION  AUG 2000   NON-OBSTRUTIVE CAD/ EF 68%/ 25% PROXIMAL  LAD/ 20% MID CIRCUMFLEX   CARDIAC CATHETERIZATION [name] 12/23/2015   Procedure: Left Heart Cath and Coronary Angiography;  Surgeon: Adrian Prows, MD;  Location: [facility] CV LAB;  Service: Cardiovascular;  Laterality: [name];   CARDIAC CATHETERIZATION [name] 12/24/2015   Procedure: Coronary Stent Intervention;  Surgeon: Adrian Prows, MD;  Location: [facility] CV LAB;  Service: Cardiovascular;  Laterality: [name];   CORONARY ANGIOPLASTY     CORONARY STENT PLACEMENT  12/24/2015    PTCA and stenting of the distal RCA    HYDROCELE EXCISION Left 04/24/2012   Procedure: HYDROCELECTOMY ADULT;  Surgeon: Fredricka Bonine, MD;  [facility];  Service: Urology;  Laterality: Left;  90 mins req for this case      [place]   LEFT AND RIGHT HEART CATHETERIZATION WITH CORONARY ANGIOGRAM [name] 09/04/2012   Procedure: LEFT AND RIGHT HEART CATHETERIZATION WITH CORONARY ANGIOGRAM;  Surgeon: Laverda Page, MD;  Location: [facility] CATH LAB;  Service: Cardiovascular;  Laterality: [name];   LEFT HEART CATHETERIZATION WITH CORONARY ANGIOGRAM [name] 05/02/2011   Procedure: LEFT HEART CATHETERIZATION WITH CORONARY ANGIOGRAM;  Surgeon: Josue Hector, MD;  Location: [facility] CATH LAB;  Service: Cardiovascular;  Laterality: [name];   THORACOTOMY/LOBECTOMY Right 03-06-2008   AND STAPLING OF BLEBS FOR SPONTANOUS PNEUMOTHORAX    TRANSTHORACIC ECHOCARDIOGRAM  02-17-2011   MODERATE LVH/ LVSF NORMAL/ EF 57-84%/ GRADE I DIASTOLIC DYSFUNCTION    REVIEW OF SYSTEMS:  Constitutional: positive for fatigue Eyes: negative Ears, nose, mouth, throat, and face: negative Respiratory: positive for dyspnea on exertion Cardiovascular: negative Gastrointestinal: negative Genitourinary:negative Integument/breast: negative Hematologic/lymphatic: negative Musculoskeletal:negative Neurological: negative Behavioral/Psych: negative Endocrine: negative Allergic/Immunologic: negative   PHYSICAL EXAMINATION: General appearance: alert, cooperative, fatigued, and no distress Head: Normocephalic, without obvious abnormality, atraumatic Neck: no adenopathy, no JVD, supple, symmetrical, trachea midline, and thyroid not enlarged, symmetric, no tenderness/mass/nodules Lymph nodes: Cervical, supraclavicular, and axillary nodes normal. Resp: clear to auscultation bilaterally Back: symmetric, no curvature. ROM normal. No CVA tenderness. Cardio: regular rate and rhythm, S1, S2 normal, no murmur, click, rub or gallop GI: soft, non-tender; bowel sounds normal; no masses,  no organomegaly Extremities: extremities normal, atraumatic, no cyanosis or edema Neurologic: Alert and oriented X 3, normal strength and tone. Normal symmetric reflexes. Normal coordination and gait  ECOG PERFORMANCE STATUS: 1 - Symptomatic but completely ambulatory  Blood pressure (!) 120/59, pulse 70, temperature (!) 97.5 F (36.4 C), temperature source Tympanic, resp. rate 19, height 6' (1.829 m), weight 199 lb 11.2 oz (90.6 kg), SpO2 97 %.  LABORATORY DATA: Lab Results  Component Value Date   WBC 8.3 10/20/2020   HGB 10.1 (L) 10/20/2020   HCT 30.6 (L) 10/20/2020   MCV 87.9 10/20/2020   PLT 281 10/20/2020      Chemistry      Component Value Date/Time   NA 134 (L) 10/12/2020 1030   K 3.1 (L) 10/12/2020 1030   CL 97 (L) 10/12/2020 1030   CO2 26 10/12/2020 1030    BUN 5 (L) 10/12/2020 1030   CREATININE 0.93 10/12/2020 1030      Component Value Date/Time   CALCIUM 9.1 10/12/2020 1030   ALKPHOS 113 10/12/2020 1030   AST 13 (L) 10/12/2020 1030   ALT 13 10/12/2020 1030   BILITOT 0.3 10/12/2020 1030       RADIOGRAPHIC STUDIES: MR Brain W Wo Contrast  Result Date: 10/04/2020 CLINICAL DATA:  70 year old male with limited stage small cell lung cancer. Restaging. EXAM: MRI HEAD  WITHOUT AND WITH CONTRAST TECHNIQUE: Multiplanar, multiecho pulse sequences of the brain and surrounding structures were obtained without and with intravenous contrast. CONTRAST:  30mL GADAVIST GADOBUTROL 1 MMOL/ML IV SOLN COMPARISON:  Brain MRI 06/29/2020 and earlier. FINDINGS: Brain: Chronically advanced, Patchy and confluent bilateral cerebral white matter T2 and FLAIR hyperintensity with scattered cystic areas of encephalomalacia compatible with a combination of chronic lacunar infarcts and perivascular spaces. Stable chronic cortical encephalomalacia in the anterior left frontal lobe. Stable T2 heterogeneity in the pons. There is chronic hemorrhage in the left midbrain and dorsal brainstem. Small chronic microhemorrhages in the deep cerebellar nuclei are stable. Occasional chronic microhemorrhages are also suspected in the deep gray nuclei (right thalamus series 9, image 27) and may have mildly progressed since 2021. No other cortical encephalomalacia identified. No midline shift, mass effect, or evidence of intracranial mass lesion. No abnormal enhancement identified. No dural thickening identified. No restricted diffusion to suggest acute infarction. No midline shift, mass effect, evidence of mass lesion, ventriculomegaly, extra-axial collection or acute intracranial hemorrhage. Cervicomedullary junction and pituitary are within normal limits. Stable cerebral volume. Vascular: Major intracranial vascular flow voids are stable. Generalized anterior circulation ectasia, tortuosity. The  major dural venous sinuses are enhancing and appear to be patent. Skull and upper cervical spine: Negative visible cervical spine. Normal visible bone marrow signal. Sinuses/Orbits: Stable, negative. Other: Trace right mastoid effusion is new. Negative visible nasopharynx. Other visible internal auditory structures appear normal. Negative visible scalp and face. IMPRESSION: 1. No metastatic disease or acute intracranial abnormality. 2. Advanced chronic small vessel disease. Chronic left anterior frontal lobe encephalomalacia. Electronically Signed   By: Genevie Ann [name]   On: 10/04/2020 07:52     ASSESSMENT AND PLAN: This is a very pleasant 70 years old white male recently diagnosed with limited stage small cell lung cancer (T3, N2, M0) presented with pleural-based pulmonary nodules in the right lung in addition to mediastinal lymphadenopathy in August 2021.  The patient was initially diagnosed with a pleural-based nodule status post SBRT in March 2021. He completed systemic chemotherapy with carboplatin for AUC of 5 from day 1 and to etoposide 100 mg/M2 on days 1, 2 and 3 with Neulasta support every 3 weeks.  Status post 4 cycles. The patient tolerated his treatment well except for fatigue. He also underwent palliative radiotherapy to enlarging right lung nodule under the care of Dr. Sondra Come. The patient is currently on observation.  He had repeat CT scan of the chest performed recently. I personally and independently reviewed the scan images and discussed the result and showed the images to the patient and his wife today. His scan showed new and enlarging bilateral pulmonary nodules and the enlarging right hilar lymph nodes consistent with metastatic disease. The patient is currently undergoing palliative systemic chemotherapy with carboplatin for AUC of 5 on day 1, etoposide 100 Mg/M2 on days 1, 2 and 3 with Cosela before the chemotherapy as well as Imfinzi 1500 Mg IV on day 1 every 3 weeks with the  chemotherapy followed by maintenance every 4 weeks.  Status post 4 cycles.  Starting from cycle #5 the patient will be on maintenance treatment with single agent Imfinzi 1500 Mg IV every 4 weeks.  Cosela was discontinued after cycle #1 secondary to phlebitis and the patient refused to continue with the treatment. The patient has been tolerating his treatment with chemotherapy fairly well. He had repeat CT scan of the chest, abdomen pelvis performed yesterday.  I personally and independently reviewed  the scan images in comparison to the previous scan and I did not see any significant change but I will wait for the final report for confirmation. I recommended for the patient to proceed with cycle #5 today which is the first cycle of the maintenance treatment with Imfinzi. He will come back for follow-up visit in 4 weeks for evaluation before starting cycle #6. The patient was advised to call immediately if he has any concerning symptoms in the interval. The patient voices understanding of current disease status and treatment options and is in agreement with the current care plan.  All questions were answered. The patient knows to call the clinic with any problems, questions or concerns. We can certainly see the patient much sooner if necessary.  Disclaimer: This note was dictated with voice recognition software. Similar sounding words can inadvertently be transcribed and may not be corrected upon review.

## 2020-10-21 ENCOUNTER — Telehealth: Payer: Self-pay | Admitting: Internal Medicine

## 2020-10-21 NOTE — Telephone Encounter (Signed)
Scheduled follow-up appointments per 10/4 los. Patient is aware.

## 2020-10-26 ENCOUNTER — Inpatient Hospital Stay: Payer: Medicare Other

## 2020-10-26 ENCOUNTER — Other Ambulatory Visit (HOSPITAL_COMMUNITY): Payer: Self-pay

## 2020-10-26 ENCOUNTER — Other Ambulatory Visit: Payer: Self-pay

## 2020-10-26 DIAGNOSIS — Z5112 Encounter for antineoplastic immunotherapy: Secondary | ICD-10-CM | POA: Diagnosis not present

## 2020-10-26 DIAGNOSIS — C3491 Malignant neoplasm of unspecified part of right bronchus or lung: Secondary | ICD-10-CM

## 2020-10-26 LAB — CBC WITH DIFFERENTIAL (CANCER CENTER ONLY)
Abs Immature Granulocytes: 0.03 10*3/uL (ref 0.00–0.07)
Basophils Absolute: 0 10*3/uL (ref 0.0–0.1)
Basophils Relative: 1 %
Eosinophils Absolute: 0 10*3/uL (ref 0.0–0.5)
Eosinophils Relative: 0 %
HCT: 30.8 % — ABNORMAL LOW (ref 39.0–52.0)
Hemoglobin: 10.3 g/dL — ABNORMAL LOW (ref 13.0–17.0)
Immature Granulocytes: 1 %
Lymphocytes Relative: 20 %
Lymphs Abs: 1.3 10*3/uL (ref 0.7–4.0)
MCH: 29.6 pg (ref 26.0–34.0)
MCHC: 33.4 g/dL (ref 30.0–36.0)
MCV: 88.5 fL (ref 80.0–100.0)
Monocytes Absolute: 1 10*3/uL (ref 0.1–1.0)
Monocytes Relative: 16 %
Neutro Abs: 3.9 10*3/uL (ref 1.7–7.7)
Neutrophils Relative %: 62 %
Platelet Count: 298 10*3/uL (ref 150–400)
RBC: 3.48 MIL/uL — ABNORMAL LOW (ref 4.22–5.81)
RDW: 20.3 % — ABNORMAL HIGH (ref 11.5–15.5)
WBC Count: 6.3 10*3/uL (ref 4.0–10.5)
nRBC: 0 % (ref 0.0–0.2)

## 2020-10-26 LAB — CMP (CANCER CENTER ONLY)
ALT: 7 U/L (ref 0–44)
AST: 13 U/L — ABNORMAL LOW (ref 15–41)
Albumin: 3.4 g/dL — ABNORMAL LOW (ref 3.5–5.0)
Alkaline Phosphatase: 83 U/L (ref 38–126)
Anion gap: 10 (ref 5–15)
BUN: 7 mg/dL — ABNORMAL LOW (ref 8–23)
CO2: 28 mmol/L (ref 22–32)
Calcium: 9.3 mg/dL (ref 8.9–10.3)
Chloride: 98 mmol/L (ref 98–111)
Creatinine: 1.03 mg/dL (ref 0.61–1.24)
GFR, Estimated: >60 mL/min (ref >60)
Glucose, Bld: 128 mg/dL — ABNORMAL HIGH (ref 70–99)
Potassium: 4 mmol/L (ref 3.5–5.1)
Sodium: 136 mmol/L (ref 135–145)
Total Bilirubin: 0.4 mg/dL (ref 0.3–1.2)
Total Protein: 7.4 g/dL (ref 6.5–8.1)

## 2020-10-26 LAB — TSH: TSH: 2.159 u[IU]/mL (ref 0.320–4.118)

## 2020-11-02 ENCOUNTER — Inpatient Hospital Stay: Payer: Medicare Other

## 2020-11-02 ENCOUNTER — Other Ambulatory Visit: Payer: Self-pay

## 2020-11-02 DIAGNOSIS — Z5112 Encounter for antineoplastic immunotherapy: Secondary | ICD-10-CM | POA: Diagnosis not present

## 2020-11-02 DIAGNOSIS — C3491 Malignant neoplasm of unspecified part of right bronchus or lung: Secondary | ICD-10-CM

## 2020-11-02 LAB — CBC WITH DIFFERENTIAL (CANCER CENTER ONLY)
Abs Immature Granulocytes: 0.03 10*3/uL (ref 0.00–0.07)
Basophils Absolute: 0.1 10*3/uL (ref 0.0–0.1)
Basophils Relative: 1 %
Eosinophils Absolute: 0.1 10*3/uL (ref 0.0–0.5)
Eosinophils Relative: 1 %
HCT: 34 % — ABNORMAL LOW (ref 39.0–52.0)
Hemoglobin: 11.1 g/dL — ABNORMAL LOW (ref 13.0–17.0)
Immature Granulocytes: 0 %
Lymphocytes Relative: 21 %
Lymphs Abs: 1.5 10*3/uL (ref 0.7–4.0)
MCH: 29.8 pg (ref 26.0–34.0)
MCHC: 32.6 g/dL (ref 30.0–36.0)
MCV: 91.2 fL (ref 80.0–100.0)
Monocytes Absolute: 0.9 10*3/uL (ref 0.1–1.0)
Monocytes Relative: 12 %
Neutro Abs: 4.6 10*3/uL (ref 1.7–7.7)
Neutrophils Relative %: 65 %
Platelet Count: 235 10*3/uL (ref 150–400)
RBC: 3.73 MIL/uL — ABNORMAL LOW (ref 4.22–5.81)
RDW: 19.6 % — ABNORMAL HIGH (ref 11.5–15.5)
WBC Count: 7.2 10*3/uL (ref 4.0–10.5)
nRBC: 0 % (ref 0.0–0.2)

## 2020-11-02 LAB — CMP (CANCER CENTER ONLY)
ALT: 9 U/L (ref 0–44)
AST: 11 U/L — ABNORMAL LOW (ref 15–41)
Albumin: 3.5 g/dL (ref 3.5–5.0)
Alkaline Phosphatase: 76 U/L (ref 38–126)
Anion gap: 12 (ref 5–15)
BUN: 8 mg/dL (ref 8–23)
CO2: 26 mmol/L (ref 22–32)
Calcium: 9.5 mg/dL (ref 8.9–10.3)
Chloride: 99 mmol/L (ref 98–111)
Creatinine: 0.9 mg/dL (ref 0.61–1.24)
GFR, Estimated: >60 mL/min (ref >60)
Glucose, Bld: 134 mg/dL — ABNORMAL HIGH (ref 70–99)
Potassium: 3.5 mmol/L (ref 3.5–5.1)
Sodium: 137 mmol/L (ref 135–145)
Total Bilirubin: 0.5 mg/dL (ref 0.3–1.2)
Total Protein: 7.6 g/dL (ref 6.5–8.1)

## 2020-11-02 LAB — TSH: TSH: 2.002 u[IU]/mL (ref 0.320–4.118)

## 2020-11-09 ENCOUNTER — Other Ambulatory Visit: Payer: Self-pay | Admitting: Internal Medicine

## 2020-11-09 ENCOUNTER — Other Ambulatory Visit: Payer: Self-pay

## 2020-11-09 ENCOUNTER — Telehealth: Payer: Self-pay | Admitting: Medical Oncology

## 2020-11-09 ENCOUNTER — Other Ambulatory Visit (HOSPITAL_COMMUNITY): Payer: Self-pay

## 2020-11-09 ENCOUNTER — Inpatient Hospital Stay: Payer: Medicare Other

## 2020-11-09 DIAGNOSIS — C3491 Malignant neoplasm of unspecified part of right bronchus or lung: Secondary | ICD-10-CM

## 2020-11-09 DIAGNOSIS — Z5112 Encounter for antineoplastic immunotherapy: Secondary | ICD-10-CM | POA: Diagnosis not present

## 2020-11-09 LAB — CBC WITH DIFFERENTIAL (CANCER CENTER ONLY)
Abs Immature Granulocytes: 0.03 10*3/uL (ref 0.00–0.07)
Basophils Absolute: 0.1 10*3/uL (ref 0.0–0.1)
Basophils Relative: 1 %
Eosinophils Absolute: 0.2 10*3/uL (ref 0.0–0.5)
Eosinophils Relative: 2 %
HCT: 34.4 % — ABNORMAL LOW (ref 39.0–52.0)
Hemoglobin: 11.5 g/dL — ABNORMAL LOW (ref 13.0–17.0)
Immature Granulocytes: 0 %
Lymphocytes Relative: 20 %
Lymphs Abs: 1.6 10*3/uL (ref 0.7–4.0)
MCH: 29.7 pg (ref 26.0–34.0)
MCHC: 33.4 g/dL (ref 30.0–36.0)
MCV: 88.9 fL (ref 80.0–100.0)
Monocytes Absolute: 0.8 10*3/uL (ref 0.1–1.0)
Monocytes Relative: 11 %
Neutro Abs: 5 10*3/uL (ref 1.7–7.7)
Neutrophils Relative %: 66 %
Platelet Count: 228 10*3/uL (ref 150–400)
RBC: 3.87 MIL/uL — ABNORMAL LOW (ref 4.22–5.81)
RDW: 17.8 % — ABNORMAL HIGH (ref 11.5–15.5)
WBC Count: 7.6 10*3/uL (ref 4.0–10.5)
nRBC: 0 % (ref 0.0–0.2)

## 2020-11-09 LAB — CMP (CANCER CENTER ONLY)
ALT: 11 U/L (ref 0–44)
AST: 15 U/L (ref 15–41)
Albumin: 3.6 g/dL (ref 3.5–5.0)
Alkaline Phosphatase: 73 U/L (ref 38–126)
Anion gap: 8 (ref 5–15)
BUN: 9 mg/dL (ref 8–23)
CO2: 27 mmol/L (ref 22–32)
Calcium: 9.1 mg/dL (ref 8.9–10.3)
Chloride: 99 mmol/L (ref 98–111)
Creatinine: 1.04 mg/dL (ref 0.61–1.24)
GFR, Estimated: >60 mL/min (ref >60)
Glucose, Bld: 127 mg/dL — ABNORMAL HIGH (ref 70–99)
Potassium: 3.1 mmol/L — ABNORMAL LOW (ref 3.5–5.1)
Sodium: 134 mmol/L — ABNORMAL LOW (ref 135–145)
Total Bilirubin: 0.5 mg/dL (ref 0.3–1.2)
Total Protein: 7.7 g/dL (ref 6.5–8.1)

## 2020-11-09 LAB — TSH: TSH: 1.436 u[IU]/mL (ref 0.350–4.500)

## 2020-11-09 MED ORDER — POTASSIUM CHLORIDE CRYS ER 20 MEQ PO TBCR
20.0000 meq | EXTENDED_RELEASE_TABLET | Freq: Every day | ORAL | 0 refills | Status: DC
  Filled 2020-11-09: qty 10, 10d supply, fill #0

## 2020-11-09 NOTE — Telephone Encounter (Signed)
I left a message on pts phone regarding his new K+ rx.

## 2020-11-09 NOTE — Telephone Encounter (Signed)
-----   Message from [name], MD sent at 11/09/2020  1:40 PM EDT ----- Please let the patient know that I will send a refill of his potassium to his pharmacy.  Thank you. ----- Message ----- From: Buel Ream, Lab In [place] Sent: 11/09/2020  10:52 AM EDT To: [name], MD

## 2020-11-10 ENCOUNTER — Other Ambulatory Visit (HOSPITAL_COMMUNITY): Payer: Self-pay

## 2020-11-12 ENCOUNTER — Other Ambulatory Visit (HOSPITAL_COMMUNITY): Payer: Self-pay

## 2020-11-12 MED ORDER — SYMBICORT 160-4.5 MCG/ACT IN AERO
2.0000 | INHALATION_SPRAY | Freq: Two times a day (BID) | RESPIRATORY_TRACT | 11 refills | Status: AC
  Filled 2020-11-12: qty 10.2, 30d supply, fill #0
  Filled 2020-12-03: qty 10.2, 30d supply, fill #1
  Filled 2021-02-15: qty 10.2, 30d supply, fill #2
  Filled 2021-03-26: qty 10.2, 30d supply, fill #3
  Filled 2021-05-11: qty 10.2, 30d supply, fill #4

## 2020-11-12 MED ORDER — DULOXETINE HCL 30 MG PO CPEP
ORAL_CAPSULE | ORAL | 3 refills | Status: AC
  Filled 2020-11-12: qty 90, 90d supply, fill #0
  Filled 2021-02-15: qty 90, 90d supply, fill #1

## 2020-11-12 MED ORDER — BISOPROLOL FUMARATE 5 MG PO TABS
ORAL_TABLET | ORAL | 3 refills | Status: AC
  Filled 2020-11-12 – 2021-02-15 (×2): qty 90, 90d supply, fill #0
  Filled 2021-05-11: qty 90, 90d supply, fill #1

## 2020-11-12 MED ORDER — TAMSULOSIN HCL 0.4 MG PO CAPS
ORAL_CAPSULE | ORAL | 3 refills | Status: DC
  Filled 2020-11-12: qty 90, 90d supply, fill #0
  Filled 2021-02-15: qty 90, 90d supply, fill #1

## 2020-11-12 MED ORDER — HYDROCHLOROTHIAZIDE 12.5 MG PO CAPS
12.5000 mg | ORAL_CAPSULE | Freq: Every day | ORAL | 2 refills | Status: DC
  Filled 2020-11-12: qty 90, 90d supply, fill #0

## 2020-11-13 ENCOUNTER — Other Ambulatory Visit (HOSPITAL_COMMUNITY): Payer: Self-pay

## 2020-11-16 ENCOUNTER — Other Ambulatory Visit (HOSPITAL_COMMUNITY): Payer: Self-pay

## 2020-11-16 ENCOUNTER — Inpatient Hospital Stay: Payer: Medicare Other

## 2020-11-16 ENCOUNTER — Encounter: Payer: Self-pay | Admitting: Cardiology

## 2020-11-16 ENCOUNTER — Encounter: Payer: Self-pay | Admitting: Internal Medicine

## 2020-11-16 ENCOUNTER — Ambulatory Visit: Payer: Medicare Other | Admitting: Cardiology

## 2020-11-16 ENCOUNTER — Other Ambulatory Visit: Payer: Self-pay

## 2020-11-16 ENCOUNTER — Inpatient Hospital Stay (HOSPITAL_BASED_OUTPATIENT_CLINIC_OR_DEPARTMENT_OTHER): Payer: Medicare Other | Admitting: Internal Medicine

## 2020-11-16 VITALS — BP 126/74 | HR 84 | Temp 98.2°F | Resp 16 | Ht 72.0 in | Wt 200.4 lb

## 2020-11-16 VITALS — BP 127/71 | HR 58

## 2020-11-16 VITALS — BP 153/98 | HR 68 | Temp 97.2°F | Resp 21 | Ht 72.0 in | Wt 199.7 lb

## 2020-11-16 DIAGNOSIS — Z5112 Encounter for antineoplastic immunotherapy: Secondary | ICD-10-CM

## 2020-11-16 DIAGNOSIS — C3491 Malignant neoplasm of unspecified part of right bronchus or lung: Secondary | ICD-10-CM | POA: Diagnosis not present

## 2020-11-16 DIAGNOSIS — C3490 Malignant neoplasm of unspecified part of unspecified bronchus or lung: Secondary | ICD-10-CM

## 2020-11-16 DIAGNOSIS — I10 Essential (primary) hypertension: Secondary | ICD-10-CM

## 2020-11-16 DIAGNOSIS — I2510 Atherosclerotic heart disease of native coronary artery without angina pectoris: Secondary | ICD-10-CM

## 2020-11-16 LAB — CMP (CANCER CENTER ONLY)
ALT: 10 U/L (ref 0–44)
AST: 11 U/L — ABNORMAL LOW (ref 15–41)
Albumin: 3.5 g/dL (ref 3.5–5.0)
Alkaline Phosphatase: 81 U/L (ref 38–126)
Anion gap: 10 (ref 5–15)
BUN: 8 mg/dL (ref 8–23)
CO2: 26 mmol/L (ref 22–32)
Calcium: 8.8 mg/dL — ABNORMAL LOW (ref 8.9–10.3)
Chloride: 98 mmol/L (ref 98–111)
Creatinine: 0.86 mg/dL (ref 0.61–1.24)
GFR, Estimated: >60 mL/min (ref >60)
Glucose, Bld: 140 mg/dL — ABNORMAL HIGH (ref 70–99)
Potassium: 3.3 mmol/L — ABNORMAL LOW (ref 3.5–5.1)
Sodium: 134 mmol/L — ABNORMAL LOW (ref 135–145)
Total Bilirubin: 0.5 mg/dL (ref 0.3–1.2)
Total Protein: 7.6 g/dL (ref 6.5–8.1)

## 2020-11-16 LAB — CBC WITH DIFFERENTIAL (CANCER CENTER ONLY)
Abs Immature Granulocytes: 0.02 10*3/uL (ref 0.00–0.07)
Basophils Absolute: 0 10*3/uL (ref 0.0–0.1)
Basophils Relative: 0 %
Eosinophils Absolute: 0.2 10*3/uL (ref 0.0–0.5)
Eosinophils Relative: 3 %
HCT: 35.9 % — ABNORMAL LOW (ref 39.0–52.0)
Hemoglobin: 11.6 g/dL — ABNORMAL LOW (ref 13.0–17.0)
Immature Granulocytes: 0 %
Lymphocytes Relative: 18 %
Lymphs Abs: 1.4 10*3/uL (ref 0.7–4.0)
MCH: 28.9 pg (ref 26.0–34.0)
MCHC: 32.3 g/dL (ref 30.0–36.0)
MCV: 89.5 fL (ref 80.0–100.0)
Monocytes Absolute: 0.9 10*3/uL (ref 0.1–1.0)
Monocytes Relative: 11 %
Neutro Abs: 5.3 10*3/uL (ref 1.7–7.7)
Neutrophils Relative %: 68 %
Platelet Count: 207 10*3/uL (ref 150–400)
RBC: 4.01 MIL/uL — ABNORMAL LOW (ref 4.22–5.81)
RDW: 16.2 % — ABNORMAL HIGH (ref 11.5–15.5)
WBC Count: 7.8 10*3/uL (ref 4.0–10.5)
nRBC: 0 % (ref 0.0–0.2)

## 2020-11-16 LAB — TSH: TSH: 1.33 u[IU]/mL (ref 0.320–4.118)

## 2020-11-16 MED ORDER — SODIUM CHLORIDE 0.9 % IV SOLN
1500.0000 mg | Freq: Once | INTRAVENOUS | Status: AC
  Administered 2020-11-16: 1500 mg via INTRAVENOUS
  Filled 2020-11-16: qty 30

## 2020-11-16 MED ORDER — SODIUM CHLORIDE 0.9 % IV SOLN: Freq: Once | INTRAVENOUS | Status: AC

## 2020-11-16 MED ORDER — NITROGLYCERIN 0.4 MG SL SUBL
SUBLINGUAL_TABLET | SUBLINGUAL | 5 refills | Status: AC
  Filled 2020-11-16: qty 25, 10d supply, fill #0
  Filled 2021-03-26: qty 25, 10d supply, fill #1

## 2020-11-16 MED ORDER — SODIUM CHLORIDE 0.9% FLUSH: 10.0000 mL | INTRAVENOUS | Status: DC | PRN

## 2020-11-16 NOTE — Patient Instructions (Signed)
[facility] MEDICAL ONCOLOGY  Discharge Instructions: Thank you for choosing [facility] to provide your oncology and hematology care.   If you have a lab appointment with the Cancer Center, please go directly to the Cancer Center and check in at the registration area.   Wear comfortable clothing and clothing appropriate for easy access to any Portacath or PICC line.   We strive to give you quality time with your provider. You may need to reschedule your appointment if you arrive late (15 or more minutes).  Arriving late affects you and other patients whose appointments are after yours.  Also, if you miss three or more appointments without notifying the office, you may be dismissed from the clinic at the provider's discretion.      For prescription refill requests, have your pharmacy contact our office and allow 72 hours for refills to be completed.    Today you received the following chemotherapy and/or immunotherapy agents Imfinzi      To help prevent nausea and vomiting after your treatment, we encourage you to take your nausea medication as directed.  BELOW ARE SYMPTOMS THAT SHOULD BE REPORTED IMMEDIATELY: *FEVER GREATER THAN 100.4 F (38 C) OR HIGHER *CHILLS OR SWEATING *NAUSEA AND VOMITING THAT IS NOT CONTROLLED WITH YOUR NAUSEA MEDICATION *UNUSUAL SHORTNESS OF BREATH *UNUSUAL BRUISING OR BLEEDING *URINARY PROBLEMS (pain or burning when urinating, or frequent urination) *BOWEL PROBLEMS (unusual diarrhea, constipation, pain near the anus) TENDERNESS IN MOUTH AND THROAT WITH OR WITHOUT PRESENCE OF ULCERS (sore throat, sores in mouth, or a toothache) UNUSUAL RASH, SWELLING OR PAIN  UNUSUAL VAGINAL DISCHARGE OR ITCHING   Items with * indicate a potential emergency and should be followed up as soon as possible or go to the Emergency Department if any problems should occur.  Please show the CHEMOTHERAPY ALERT CARD or IMMUNOTHERAPY ALERT CARD at check-in to the  Emergency Department and triage nurse.  Should you have questions after your visit or need to cancel or reschedule your appointment, please contact [facility] MEDICAL [facility]COL[facility]  Dept: [phone]  and follow the prompts.  Office hours are 8:00 a.m. to 4:30 p.m. Monday - Friday. Please note that voicemails left after 4:00 p.m. may not be returned until the following business day.  We are closed weekends and major holidays. You have access to a nurse at all times for urgent questions. Please call the main number to the clinic Dept: [phone] and follow the prompts.   For any non-urgent questions, you may also contact your provider using MyChart. We now offer e-Visits for anyone 18 and older to request care online for non-urgent symptoms. For details visit [email or web address].   Also download the MyChart app! Go to the app store, search "MyChart", open the app, select [facility], and log in with your MyChart username and password.  Due to Covid, a mask is required upon entering the hospital/clinic. If you do not have a mask, one will be given to you upon arrival. For doctor visits, patients may have 1 support person aged 18 or older with them. For treatment visits, patients cannot have anyone with them due to current Covid guidelines and our immunocompromised population.   

## 2020-11-16 NOTE — Progress Notes (Signed)
Primary Physician/Referring:  [name], MD  Patient ID: Jonathon [name], male    DOB: August 17, 1950, 70 y.o.   MRN: [id number]  Chief Complaint  Patient presents with  . Coronary Artery Disease  . Hypertension  . Follow-up    3 months   HPI:    Jadore A Cappello  is a 70 y.o.  [name] male patient with history of hypertension, hyperlipidemia, remote stroke without restrictive defect, tobacco cessation in September 2018, CAD with PCI in December 2017 to RCA. He also has history of emphysema, pulmonary nodules and right pneumothorax in 2010 due to emphysematous bleb while he was in [place] and has history of right upper lobe lobectomy for lung cancer.  He has had recurrence of cancer in the right lower lobe and finished radiation therapy.  Unfortunately he has now had a third recurrence of metastatic lung cancer and has now been recommended palliative chemotherapy and immunotherapy.  Patient presents for 3 month follow up hypertension, CAD.  He has had occasional episodes of angina for which he is taking sublingual nitroglycerin.  Otherwise is still going through palliative chemotherapy/immunotherapy.  Accompanied by his wife. Past Medical History:  Diagnosis Date  . Cancer ([place])   . CHF (congestive heart failure) ([place])   . Chronic cough   . COPD, severe ([place])    PT DENIES REGULAR DAILY INHALER ANY PRN  . Coronary artery disease   . CVA (cerebral vascular accident) ([place]) 02/22/2011   LEFT BRAIN STEM PONTINE HEMORRHAGE--  RIGHT SIDED WEAKNESS  . Diabetes mellitus without complication ([place])    type 2  . Dyspnea   . GERD (gastroesophageal reflux disease)   . Harsh voice quality    PT STATES NORMAL FOR HIM  . High cholesterol   . History of CHF (congestive heart failure) MONITORED BY DR BOUSKA   DIASTOLIC  . History of radiation therapy 03/13/19-03/20/19   SBRT Right Lung; Dr. Gery Pray  . History of radiation therapy 02/17/20-02/28/20   Whole brain radiation; Dr.  Gery Pray  . History of radiation therapy 05/25/2020   05/18/2020-05/25/2020  SBRT Right Lung; Dr. Gery Pray  . Hydrocele, left   . Hypertension   . NSTEMI (non-ST elevated myocardial infarction) ([place]) 12/2015  . Pre-operative clearance    GIVEN BY DR Coletta Memos  . Short of breath on exertion   . Smoker   . Weakness of right side of body    S/P CVA   FEB 2013   Past Surgical History:  Procedure Laterality Date  . ABDOMINAL ANGIOGRAM  09/04/2012   Procedure: ABDOMINAL ANGIOGRAM;  Surgeon: Laverda Page, MD;  Location: [facility] CATH LAB;  Service: Cardiovascular;;  . CARDIAC CATHETERIZATION  05-02-2011  DR Johnsie Cancel   MILD NONOBSTRUCTIVE CAD/ LAD, OM , AND D1/ EF 60^  . CARDIAC CATHETERIZATION  AUG 2000   NON-OBSTRUTIVE CAD/ EF 68%/ 25% PROXIMAL LAD/ 20% MID CIRCUMFLEX  . CARDIAC CATHETERIZATION [name] 12/23/2015   Procedure: Left Heart Cath and Coronary Angiography;  Surgeon: Adrian Prows, MD;  Location: [facility] CV LAB;  Service: Cardiovascular;  Laterality: [name];  . CARDIAC CATHETERIZATION [name] 12/24/2015   Procedure: Coronary Stent Intervention;  Surgeon: Adrian Prows, MD;  Location: [facility] CV LAB;  Service: Cardiovascular;  Laterality: [name];  . CORONARY ANGIOPLASTY    . CORONARY STENT PLACEMENT  12/24/2015    PTCA and stenting of the distal RCA   . HYDROCELE EXCISION Left 04/24/2012   Procedure: HYDROCELECTOMY ADULT;  Surgeon: Fredricka Bonine,  MD;  Location: [facility];  Service: Urology;  Laterality: Left;  90 mins req for this case     . [place]  . LEFT AND RIGHT HEART CATHETERIZATION WITH CORONARY ANGIOGRAM [name] 09/04/2012   Procedure: LEFT AND RIGHT HEART CATHETERIZATION WITH CORONARY ANGIOGRAM;  Surgeon: Laverda Page, MD;  Location: [facility] CATH LAB;  Service: Cardiovascular;  Laterality: [name];  . LEFT HEART CATHETERIZATION WITH CORONARY ANGIOGRAM [name] 05/02/2011   Procedure: LEFT HEART CATHETERIZATION WITH CORONARY ANGIOGRAM;  Surgeon: Josue Hector, MD;  Location: [facility] CATH LAB;  Service: Cardiovascular;  Laterality: [name];  . THORACOTOMY/LOBECTOMY Right 03-06-2008   AND STAPLING OF BLEBS FOR SPONTANOUS PNEUMOTHORAX  . TRANSTHORACIC ECHOCARDIOGRAM  02-17-2011   MODERATE LVH/ LVSF NORMAL/ EF 63-84%/ GRADE I DIASTOLIC DYSFUNCTION   Social History   Tobacco Use  . Smoking status: Some Days    Packs/day: 0.00    Years: 53.00    Pack years: 0.00    Types: Cigarettes    Last attempt to quit: 2019    Years since quitting: 3.8  . Smokeless tobacco: Never  . Tobacco comments:    10/17/2020 maybe 1 cigarette with a cup of coffee  Substance Use Topics  . Alcohol use: Not Currently    Alcohol/week: 0.0 standard drinks    Comment: 02/10/2016 "nothing in the last 10 years"   Marital status: Married   ROS  Review of Systems  Cardiovascular:  Positive for chest pain. Negative for leg swelling and syncope.  Respiratory:  Positive for cough, shortness of breath and wheezing.   Hematologic/Lymphatic: Does not bruise/bleed easily.  Gastrointestinal:  Negative for melena.  Objective   Vitals with BMI 11/16/2020 11/16/2020 11/16/2020  Height 6\' 0"  - 6\' 0"   Weight 200 lbs 6 oz - 199 lbs 11 oz  BMI 53.64 - 68.03  Systolic 212 248 250  Diastolic 74 71 98  Pulse 84 58 68    Blood pressure 126/74, pulse 84, temperature 98.2 F (36.8 C), temperature source Temporal, resp. rate 16, height 6' (1.829 m), weight 200 lb 6.4 oz (90.9 kg), SpO2 90 %. Body mass index is 27.18 kg/m.   Physical Exam Vitals reviewed.  Constitutional:      Comments: Well-built, mildly obese in no acute distress.  Cardiovascular:     Rate and Rhythm: Normal rate and regular rhythm.     Pulses: Normal pulses and intact distal pulses.     Heart sounds: Normal heart sounds. No murmur heard.   No gallop.     Comments: No JVD.  Pulmonary:     Effort: Pulmonary effort is normal.     Breath sounds: Wheezing (diffuse scattered occasional) and rales (left base)  present.  Abdominal:     General: Bowel sounds are normal.     Palpations: Abdomen is soft.  Musculoskeletal:        General: Normal range of motion.     Right lower leg: No edema.     Left lower leg: No edema.  Skin:    General: Skin is warm and dry.     Capillary Refill: Capillary refill takes less than 2 seconds.  Neurological:     General: No focal deficit present.     Mental Status: He is alert.    Laboratory examination:   CMP Latest Ref Rng & Units 11/16/2020 11/09/2020 11/02/2020  Glucose 70 - 99 mg/dL 140(H) 127(H) 134(H)  BUN 8 - 23 mg/dL 8 9 8  Creatinine 0.61 - 1.24 mg/dL 0.86 1.04 0.90  Sodium 135 - 145 mmol/L 134(L) 134(L) 137  Potassium 3.5 - 5.1 mmol/L 3.3(L) 3.1(L) 3.5  Chloride 98 - 111 mmol/L 98 99 99  CO2 22 - 32 mmol/L 26 27 26   Calcium 8.9 - 10.3 mg/dL 8.8(L) 9.1 9.5  Total Protein 6.5 - 8.1 g/dL 7.6 7.7 7.6  Total Bilirubin 0.3 - 1.2 mg/dL 0.5 0.5 0.5  Alkaline Phos 38 - 126 U/L 81 73 76  AST 15 - 41 U/L 11(L) 15 11(L)  ALT 0 - 44 U/L 10 11 9    CBC Latest Ref Rng & Units 11/16/2020 11/09/2020 11/02/2020  WBC 4.0 - 10.5 K/uL 7.8 7.6 7.2  Hemoglobin 13.0 - 17.0 g/dL 11.6(L) 11.5(L) 11.1(L)  Hematocrit 39.0 - 52.0 % 35.9(L) 34.4(L) 34.0(L)  Platelets 150 - 400 K/uL 207 228 235   TSH Recent Labs    11/02/20 1048 11/09/20 1047 11/16/20 0909  TSH 2.002 1.436 1.330     External Labs:  11/27/2019: A1c 7.0%. Total cholesterol 135, triglycerides 103, HDL 62, LDL 54.  Medications   Allergies  Allergen Reactions  . Atorvastatin Other (See Comments)    Severe constipation      Current Outpatient Medications  Medication Instructions  . albuterol (PROVENTIL) 2.5 mg, Nebulization, Every 4 hours PRN  . Albuterol Sulfate (PROAIR RESPICLICK) 030 (90 BASE) MCG/ACT AEPB 2 puffs, Inhalation, 4 times daily PRN  . amLODipine (NORVASC) 10 MG tablet TAKE 1 TABLET BY MOUTH ONCE A DAY  . atorvastatin (LIPITOR) 20 MG tablet Take one tablet (20 mg dose) by  mouth daily.  . bisoprolol (ZEBETA) 5 MG tablet Take one tablet (5 mg dose) by mouth daily.  . cloNIDine (CATAPRES) 0.3 MG tablet TAKE 1 TABLET BY MOUTH TWICE DAILY  . Cyanocobalamin (VITAMIN B 12 PO) 1 tablet, Oral, BH-each morning  . cyclobenzaprine (FLEXERIL) 5 MG tablet TAKE 1 TABLET BY MOUTH AT BEDTIME AS NEEDED FOR MUSCLE SPASMS  . dabigatran (PRADAXA) 150 mg, Oral, 2 times daily  . DULoxetine (CYMBALTA) 30 MG capsule Take one capsule (30 mg dose) by mouth daily.  Marland Kitchen gabapentin (NEURONTIN) 400 MG capsule TAKE 2 CAPSULES BY MOUTH THREE TIMES A DAY  . hydrochlorothiazide (MICROZIDE) 12.5 MG capsule TAKE 1 CAPSULE (12.5 MG TOTAL) BY MOUTH DAILY.  Marland Kitchen losartan (COZAAR) 100 MG tablet TAKE 1 TABLET (100 MG TOTAL) BY MOUTH DAILY.  . metFORMIN (GLUCOPHAGE) 500 MG tablet TAKE 1 TABLET BY MOUTH ONCE A DAY WITH BREAKFAST  . nitroGLYCERIN (NITROSTAT) 0.4 MG SL tablet PLACE ONE TABLET (0.4 MG DOSE) UNDER THE TONGUE EVERY 5 (FIVE) MINUTES AS NEEDED.  Marland Kitchen nortriptyline (PAMELOR) 25 MG capsule TAKE 1 CAPSULE BY MOUTH EVERY NIGHT AT BEDTIME  . pantoprazole (PROTONIX) 20 MG tablet TAKE 1 TABLET BY MOUTH DAILY  . potassium chloride SA (KLOR-CON) 20 MEQ tablet Take 1 tablet by mouth once daily.  . prochlorperazine (COMPAZINE) 10 mg, Oral, Every 6 hours PRN  . SYMBICORT 160-4.5 MCG/ACT inhaler INHALE 2 PUFFS INTO THE LUNGS TWICE DAILY  . tamsulosin (FLOMAX) 0.4 MG CAPS capsule Take one capsule (0.4 mg dose) by mouth daily.  . temazepam (RESTORIL) 15 MG capsule Oral    Radiology:  No results found.  CT scan of the chest with contrast 11/14/2019: 1. Interval response to therapy. The right juxta hilar lung nodule has decreased in size in the interval. The subpleural nodule within the periphery of the right lower lobe lung is also decreased in size in  the interval. The tiny nodule within the right upper lung is stable to mildly decreased in the interval. 2. Stable to mild decrease in size of right hilar and  right paratracheal lymph nodes. 3. Emphysema and aortic atherosclerosis. 4. Coronary artery calcifications.  Compared to 01/24/2019, there is no mention of descending thoracic aortic aneurysm measuring 3.3 x 3.3 cm.  Cardiac Studies:   Coronary angiogram 12/24/2015: PTCA and stenting of the distal RCA with 4.0 x 12 mm onyx DES. Normal LVEF. Mild disease in other vessels.  Lexiscan myoview stress test 06/19/2017:  1. Lexiscan stress test was performed. Exercise capacity was not assessed. Stress symptoms included shortness of breath, lightheadedness, and chest pressure. Blood pressure was 126/78 mmHg. Stress EKG is non diagnostic for ischemia as it is a pharmacologic stress. In addition, it showed normal sinus rhythm, normal stress conduction, no stress arrhythmias and normal stress repolarization.   2. The overall quality of the study is good. There is no evidence of abnormal lung activity. Stress and rest SPECT images demonstrate homogeneous tracer distribution throughout the myocardium. Gated SPECT imaging reveals normal myocardial thickening and wall motion. The left ventricular ejection fraction was calculated as 43%, however visually appears normal. 3. Low to intermediate risk study.  Echocardiogram 08/08/2017:  Left ventricle cavity is normal in size. Mild concentric hypertrophy of the left ventricle. Normal global wall motion. Normal diastolic filling pattern. Calculated EF 52%. Mild to moderate aortic regurgitation. Mild (Grade I) mitral regurgitation. Inadequate tricuspid regurgitation jet to estimate pulmonary artery pressure. Normal right atrial pressure. No significant change compared to previous study on 01/06/2016.   EKG:   EKG 12/02/2019: Normal sinus rhythm at rate of 66 bpm, leftward axis, incomplete right bundle branch block.  No significant change from 05/07/2019.   Assessment     ICD-10-CM   1. Coronary artery disease involving native coronary artery of native heart  without angina pectoris  I25.10 nitroGLYCERIN (NITROSTAT) 0.4 MG SL tablet    2. Essential hypertension, benign  I10     3. Metastatic lung cancer (metastasis from lung to other site), unspecified laterality ([place])  C34.90       Meds ordered this encounter  Medications  . nitroGLYCERIN (NITROSTAT) 0.4 MG SL tablet    Sig: PLACE ONE TABLET (0.4 MG DOSE) UNDER THE TONGUE EVERY 5 (FIVE) MINUTES AS NEEDED.    Dispense:  25 tablet    Refill:  5   Medications Discontinued During This Encounter  Medication Reason  . hydrochlorothiazide (MICROZIDE) 12.5 MG capsule Error  . mupirocin cream (BACTROBAN) 2 % Error  . SYMBICORT 160-4.5 MCG/ACT inhaler Error  . nitroGLYCERIN (NITROSTAT) 0.4 MG SL tablet Reorder     Recommendations:   Townsend A Grindle  is a 70 y.o. [name] male patient with history of hypertension, hyperlipidemia, remote stroke without restrictive defect, tobacco cessation in September 2018, CAD with PCI in December 2017 to RCA. He also has history of emphysema, pulmonary nodules and right pneumothorax in 2010 due to emphysematous bleb while he was in [place] and has history of right upper lobe lobectomy for lung cancer.  He has had recurrence of cancer in the right lower lobe and finished radiation therapy.  Unfortunately he has now had a third recurrence of metastatic lung cancer and has now been recommended palliative chemotherapy and immunotherapy.  From cardiac standpoint there is no clinical evidence of heart failure, blood pressure is well controlled, is tolerating all his medications well he has had occasional episodes of angina  and requests refill of nitroglycerin.  Otherwise no clinical evidence of heart failure, he is on appropriate medical therapy, really not much for me to offer but he likes to see me back on a frequent basis, an appointment was again made to be seen at 3 months.  After long discussion on his last office visit 3 months ago, I have created ACP  documents and also end of life goals discussed with the patient.  He is now DNR.      Adrian Prows, PA-C 11/16/2020, 4:08 PM Office: [phone]

## 2020-11-16 NOTE — Progress Notes (Signed)
Jonathon [name] [phone]   Fax:(336) [phone]  OFFICE PROGRESS NOTE  [name], MD [address] [place] [address]  DIAGNOSIS: Relapsed small cell lung cancer initially diagnosed as limited stage (T3, N2, M0) small cell lung cancer presented with pulmonary nodules and pleural-based nodules in the right lung in addition to mediastinal lymphadenopathy diagnosed in August 2021.  The patient was previously treated without biopsy to a pleural-based nodule in the right lower lobe with SBRT in March 2021 but had evidence for disease recurrence and progression.  He has disease relapse in June 2022.  PRIOR THERAPY:  1) SBRT to right lower lobe pulmonary nodule in March 2021 under the care of Dr. Sondra Come. 2) Systemic chemotherapy with carboplatin for AUC of 5 on day 1 and etoposide 100 mg/M2 on days 1, 2 and 3 with Neulasta support every 3 weeks.  First cycle 10/07/2019.  Status post 4 cycles.  Last dose of the chemotherapy was given on December 09, 2019.  CURRENT THERAPY: Second line systemic chemotherapy with carboplatin for AUC of 5 on day 1, etoposide 100 Mg/M2 on days 1, 2 and 3 with Cosela before the chemotherapy in addition to Imfinzi 1500 Mg IV on day 1 every 3 weeks.  First dose July 27, 2020.  Status post 5 cycles.  Cosela was discontinued secondary to phlebitis.  Starting from cycle #5 the patient will be on maintenance treatment with Imfinzi 1500 Mg IV every 4 weeks.  INTERVAL HISTORY: Jonathon [name] 70 y.o. male returns to the clinic today for follow-up visit accompanied by his wife.  The patient is feeling fine today with no concerning complaints except for the baseline shortness of breath.  Is feeling much better after starting the maintenance immunotherapy.  He denied having any current chest pain but continues to have mild cough and wheezing with no hemoptysis.  He denied having any fever or chills.  He has no nausea, vomiting,  diarrhea or constipation.  He has no headache or visual changes.  He denied having any significant weight loss or night sweats.  He is here today for evaluation before starting cycle #6 of his treatment.  MEDICAL HISTORY: Past Medical History:  Diagnosis Date   Cancer ([place])    CHF (congestive heart failure) (HCC)    Chronic cough    COPD, severe (HCC)    PT DENIES REGULAR DAILY INHALER ANY PRN   Coronary artery disease    CVA (cerebral vascular accident) ([place]) 02/22/2011   LEFT BRAIN STEM PONTINE HEMORRHAGE--  RIGHT SIDED WEAKNESS   Diabetes mellitus without complication (HCC)    type 2   Dyspnea    GERD (gastroesophageal reflux disease)    Harsh voice quality    PT STATES NORMAL FOR HIM   High cholesterol    History of CHF (congestive heart failure) MONITORED BY DR Coletta Memos   DIASTOLIC   History of radiation therapy 03/13/19-03/20/19   SBRT Right Lung; Dr. Gery Pray   History of radiation therapy 02/17/20-02/28/20   Whole brain radiation; Dr. Gery Pray   History of radiation therapy 05/25/2020   05/18/2020-05/25/2020  SBRT Right Lung; Dr. Gery Pray   Hydrocele, left    Hypertension    NSTEMI (non-ST elevated myocardial infarction) ([place]) 12/2015   Pre-operative clearance    GIVEN BY DR Coletta Memos   Short of breath on exertion    Smoker    Weakness of right side of body  S/P CVA   FEB 2013    ALLERGIES:  is allergic to atorvastatin.  MEDICATIONS:  Current Outpatient Medications  Medication Sig Dispense Refill   albuterol (PROVENTIL) (2.5 MG/3ML) 0.083% nebulizer solution Take 2.5 mg by nebulization every 4 (four) hours as needed for wheezing or shortness of breath.     Albuterol Sulfate (PROAIR RESPICLICK) 177 (90 BASE) MCG/ACT AEPB Inhale 2 puffs into the lungs 4 (four) times daily as needed. 1 each 5   amLODipine (NORVASC) 10 MG tablet TAKE 1 TABLET BY MOUTH ONCE A DAY 90 tablet 3   atorvastatin (LIPITOR) 20 MG tablet Take one tablet (20 mg dose) by mouth daily.  30 tablet 5   bisoprolol (ZEBETA) 5 MG tablet TAKE ONE (1) TABLET BY MOUTH EVERY DAY 90 tablet 3   bisoprolol (ZEBETA) 5 MG tablet Take one tablet (5 mg dose) by mouth daily. 90 tablet 3   cloNIDine (CATAPRES) 0.3 MG tablet TAKE 1 TABLET BY MOUTH TWICE DAILY 180 tablet 3   Cyanocobalamin (VITAMIN B 12 PO) Take 1 tablet by mouth every morning.      cyclobenzaprine (FLEXERIL) 5 MG tablet TAKE 1 TABLET BY MOUTH AT BEDTIME AS NEEDED FOR MUSCLE SPASMS 30 tablet 3   dabigatran (PRADAXA) 150 MG CAPS capsule Take 150 mg by mouth 2 (two) times daily.     DULoxetine (CYMBALTA) 30 MG capsule TAKE ONE CAPSULE BY MOUTH DAILY 90 capsule 3   DULoxetine (CYMBALTA) 30 MG capsule Take one capsule (30 mg dose) by mouth daily. 90 capsule 3   gabapentin (NEURONTIN) 400 MG capsule TAKE 2 CAPSULES BY MOUTH THREE TIMES A DAY 180 capsule 7   hydrochlorothiazide (MICROZIDE) 12.5 MG capsule TAKE 1 CAPSULE (12.5 MG TOTAL) BY MOUTH DAILY. 90 capsule 2   hydrochlorothiazide (MICROZIDE) 12.5 MG capsule TAKE 1 CAPSULE (12.5 MG TOTAL) BY MOUTH DAILY. 90 capsule 2   losartan (COZAAR) 100 MG tablet TAKE 1 TABLET (100 MG TOTAL) BY MOUTH DAILY. 90 tablet 3   metFORMIN (GLUCOPHAGE) 500 MG tablet TAKE 1 TABLET BY MOUTH ONCE A DAY WITH BREAKFAST 90 tablet 3   mupirocin cream (BACTROBAN) 2 % APPLY TOPICALLY 2 (TWO) TIMES DAILY. 15 g 0   nitroGLYCERIN (NITROSTAT) 0.4 MG SL tablet PLACE ONE TABLET (0.4 MG DOSE) UNDER THE TONGUE EVERY 5 (FIVE) MINUTES AS NEEDED. 25 tablet 5   nortriptyline (PAMELOR) 25 MG capsule TAKE 1 CAPSULE BY MOUTH EVERY NIGHT AT BEDTIME 90 capsule 0   pantoprazole (PROTONIX) 20 MG tablet TAKE 1 TABLET BY MOUTH DAILY 90 tablet 2   potassium chloride SA (KLOR-CON) 20 MEQ tablet Take 1 tablet by mouth once daily. 10 tablet 0   prochlorperazine (COMPAZINE) 10 MG tablet Take 1 tablet (10 mg total) by mouth every 6 (six) hours as needed for nausea or vomiting. 30 tablet 0   SYMBICORT 160-4.5 MCG/ACT inhaler INHALE 2 PUFFS  INTO THE LUNGS TWICE DAILY 10.2 g 11   SYMBICORT 160-4.5 MCG/ACT inhaler INHALE 2 PUFFS INTO THE LUNGS TWICE DAILY 10.2 g 11   tamsulosin (FLOMAX) 0.4 MG CAPS capsule Take one capsule (0.4 mg dose) by mouth daily. 90 capsule 3   temazepam (RESTORIL) 15 MG capsule Take by mouth.     Tiotropium Bromide Monohydrate (SPIRIVA RESPIMAT) 2.5 MCG/ACT AERS Inhale two puffs into the lungs as needed. 4 g 11   No current facility-administered medications for this visit.    SURGICAL HISTORY:  Past Surgical History:  Procedure Laterality Date   ABDOMINAL ANGIOGRAM  09/04/2012   Procedure: ABDOMINAL ANGIOGRAM;  Surgeon: Laverda Page, MD;  Location: [facility] CATH LAB;  Service: Cardiovascular;;   CARDIAC CATHETERIZATION  05-02-2011  DR Johnsie Cancel   MILD NONOBSTRUCTIVE CAD/ LAD, OM , AND D1/ EF 60^   CARDIAC CATHETERIZATION  AUG 2000   NON-OBSTRUTIVE CAD/ EF 68%/ 25% PROXIMAL LAD/ 20% MID CIRCUMFLEX   CARDIAC CATHETERIZATION [name] 12/23/2015   Procedure: Left Heart Cath and Coronary Angiography;  Surgeon: Adrian Prows, MD;  Location: [facility] CV LAB;  Service: Cardiovascular;  Laterality: [name];   CARDIAC CATHETERIZATION [name] 12/24/2015   Procedure: Coronary Stent Intervention;  Surgeon: Adrian Prows, MD;  Location: [facility] CV LAB;  Service: Cardiovascular;  Laterality: [name];   CORONARY ANGIOPLASTY     CORONARY STENT PLACEMENT  12/24/2015    PTCA and stenting of the distal RCA    HYDROCELE EXCISION Left 04/24/2012   Procedure: HYDROCELECTOMY ADULT;  Surgeon: Fredricka Bonine, MD;  Location: [facility];  Service: Urology;  Laterality: Left;  90 mins req for this case      [place]   LEFT AND RIGHT HEART CATHETERIZATION WITH CORONARY ANGIOGRAM [name] 09/04/2012   Procedure: LEFT AND RIGHT HEART CATHETERIZATION WITH CORONARY ANGIOGRAM;  Surgeon: Laverda Page, MD;  Location: [facility] C[facility] LAB;  Service: Cardiovascular;  Laterality: [name];   LEFT HEART CATHETERIZATION WITH CORONARY  ANGIOGRAM [name] 05/02/2011   Procedure: LEFT HEART CATHETERIZATION WITH CORONARY ANGIOGRAM;  Surgeon: Josue Hector, MD;  Location: [facility] CATH LAB;  Service: Cardiovascular;  Laterality: [name];   THORACOTOMY/LOBECTOMY Right 03-06-2008   AND STAPLING OF BLEBS FOR SPONTANOUS PNEUMOTHORAX   TRANSTHORACIC ECHOCARDIOGRAM  02-17-2011   MODERATE LVH/ LVSF NORMAL/ EF 97-98%/ GRADE I DIASTOLIC DYSFUNCTION    REVIEW OF SYSTEMS:  A comprehensive review of systems was negative except for: Respiratory: positive for dyspnea on exertion and wheezing   PHYSICAL EXAMINATION: General appearance: alert, cooperative, fatigued, and no distress Head: Normocephalic, without obvious abnormality, atraumatic Neck: no adenopathy, no JVD, supple, symmetrical, trachea midline, and thyroid not enlarged, symmetric, no tenderness/mass/nodules Lymph nodes: Cervical, supraclavicular, and axillary nodes normal. Resp: clear to auscultation bilaterally Back: symmetric, no curvature. ROM normal. No CVA tenderness. Cardio: regular rate and rhythm, S1, S2 normal, no murmur, click, rub or gallop GI: soft, non-tender; bowel sounds normal; no masses,  no organomegaly Extremities: extremities normal, atraumatic, no cyanosis or edema  ECOG PERFORMANCE STATUS: 1 - Symptomatic but completely ambulatory  Blood pressure (!) 153/98, pulse 68, temperature (!) 97.2 F (36.2 C), temperature source Tympanic, resp. rate (!) 21, height 6' (1.829 m), weight 199 lb 11.2 oz (90.6 kg), SpO2 94 %.  LABORATORY DATA: Lab Results  Component Value Date   WBC 7.8 11/16/2020   HGB 11.6 (L) 11/16/2020   HCT 35.9 (L) 11/16/2020   MCV 89.5 11/16/2020   PLT 207 11/16/2020      Chemistry      Component Value Date/Time   NA 134 (L) 11/09/2020 1047   K 3.1 (L) 11/09/2020 1047   CL 99 11/09/2020 1047   CO2 27 11/09/2020 1047   BUN 9 11/09/2020 1047   CREATININE 1.04 11/09/2020 1047      Component Value Date/Time   CALCIUM 9.1 11/09/2020 1047    ALKPHOS 73 11/09/2020 1047   AST 15 11/09/2020 1047   ALT 11 11/09/2020 1047   BILITOT 0.5 11/09/2020 1047       RADIOGRAPHIC STUDIES: CT Chest W Contrast  Result Date: 10/20/2020  CLINICAL DATA:  A 70 year old male presents for follow-up of small cell lung cancer restaging. EXAM: CT CHEST, ABDOMEN, AND PELVIS WITH CONTRAST TECHNIQUE: Multidetector CT imaging of the chest, abdomen and pelvis was performed following the standard protocol during bolus administration of intravenous contrast. CONTRAST:  50mL OMNIPAQUE IOHEXOL 350 MG/ML SOLN COMPARISON:  None. September 04, 2020. FINDINGS: CT CHEST FINDINGS Cardiovascular: Normal caliber of the thoracic aorta which is tortuous and atherosclerotic with similar appearance to prior imaging. Normal heart size without substantial pericardial effusion. Pericardial nodularity (image 44/2) 10 mm greatest thickness, previously 9 mm. Central pulmonary vasculature unremarkable on venous phase assessment. Mediastinum/Nodes: Scattered small lymph nodes throughout the chest in the mediastinum without change. RIGHT hilar adenopathy (image 40/2) 1.9 cm stable. Lungs/Pleura: Marked pulmonary emphysema with similar appearance to prior imaging. Nodule along the pericardium/LEFT heart border as outlined above. This is within 1 mm of previous imaging in the LEFT upper lobe. New tubular nodular density in the LEFT lower lobe (image 138/5) 8 mm. This appears to track along bronchial structures. No new lobar consolidation or sign of effusion. Musculoskeletal: See below for full musculoskeletal details. CT ABDOMEN PELVIS FINDINGS Hepatobiliary: No focal, suspicious hepatic lesion. No pericholecystic stranding. No biliary duct dilation. Portal vein is patent. Cholelithiasis as before. Pancreas: Normal, without mass, inflammation or ductal dilatation. Spleen: Spleen normal size and contour. Adrenals/Urinary Tract: Adrenal glands are unchanged with mild nodularity of the LEFT adrenal. Upper  pole cyst arises from posterior LEFT kidney without change. No hydronephrosis, suspicious renal lesion or perinephric stranding. Urinary bladder is collapsed without signs of thickening or adjacent stranding. Stomach/Bowel: No acute gastrointestinal process. Stool fills much of the colon without obstruction or signs of inflammation. Vascular/Lymphatic: Aortic atherosclerosis. No sign of aneurysm. Smooth contour of the IVC. There is no gastrohepatic or hepatoduodenal ligament lymphadenopathy. No retroperitoneal or mesenteric lymphadenopathy. No pelvic sidewall lymphadenopathy. Separate origin of hepatic and splenic arteries from the aorta. Reproductive: Unremarkable by CT. Other: No ascites. Musculoskeletal: No acute bone finding. No destructive bone process. Spinal degenerative changes. Lobular density along the posterior RIGHT back in the subcutaneous fat is unchanged at approximately 2.1 cm. Potentially mildly complicated sebaceous cyst unchanged since 2021. Signs of RIGHT thoracotomy as before. IMPRESSION: Stable LEFT juxta cardial soft tissue inseparable from pericardium and stable RIGHT hilar adenopathy. No new or progressive findings. Mildly enlarged RIGHT paratracheal lymph node is also unchanged. New tubular nodular density in the LEFT lower lobe. This appears to track along bronchial structures and is more compatible with infectious or inflammatory changes, attention on follow-up. Cholelithiasis without evidence of acute cholecystitis. Aortic Atherosclerosis (ICD10-I70.0) and Emphysema (ICD10-J43.9). Electronically Signed   By: Zetta Bills [name]   On: 10/20/2020 16:07   CT Abdomen Pelvis W Contrast  Result Date: 10/20/2020 CLINICAL DATA:  A 70 year old male presents for follow-up of small cell lung cancer restaging. EXAM: CT CHEST, ABDOMEN, AND PELVIS WITH CONTRAST TECHNIQUE: Multidetector CT imaging of the chest, abdomen and pelvis was performed following the standard protocol during bolus  administration of intravenous contrast. CONTRAST:  19mL OMNIPAQUE IOHEXOL 350 MG/ML SOLN COMPARISON:  None. September 04, 2020. FINDINGS: CT CHEST FINDINGS Cardiovascular: Normal caliber of the thoracic aorta which is tortuous and atherosclerotic with similar appearance to prior imaging. Normal heart size without substantial pericardial effusion. Pericardial nodularity (image 44/2) 10 mm greatest thickness, previously 9 mm. Central pulmonary vasculature unremarkable on venous phase assessment. Mediastinum/Nodes: Scattered small lymph nodes throughout the chest in the mediastinum without change. RIGHT hilar adenopathy (  image 40/2) 1.9 cm stable. Lungs/Pleura: Marked pulmonary emphysema with similar appearance to prior imaging. Nodule along the pericardium/LEFT heart border as outlined above. This is within 1 mm of previous imaging in the LEFT upper lobe. New tubular nodular density in the LEFT lower lobe (image 138/5) 8 mm. This appears to track along bronchial structures. No new lobar consolidation or sign of effusion. Musculoskeletal: See below for full musculoskeletal details. CT ABDOMEN PELVIS FINDINGS Hepatobiliary: No focal, suspicious hepatic lesion. No pericholecystic stranding. No biliary duct dilation. Portal vein is patent. Cholelithiasis as before. Pancreas: Normal, without mass, inflammation or ductal dilatation. Spleen: Spleen normal size and contour. Adrenals/Urinary Tract: Adrenal glands are unchanged with mild nodularity of the LEFT adrenal. Upper pole cyst arises from posterior LEFT kidney without change. No hydronephrosis, suspicious renal lesion or perinephric stranding. Urinary bladder is collapsed without signs of thickening or adjacent stranding. Stomach/Bowel: No acute gastrointestinal process. Stool fills much of the colon without obstruction or signs of inflammation. Vascular/Lymphatic: Aortic atherosclerosis. No sign of aneurysm. Smooth contour of the IVC. There is no gastrohepatic or  hepatoduodenal ligament lymphadenopathy. No retroperitoneal or mesenteric lymphadenopathy. No pelvic sidewall lymphadenopathy. Separate origin of hepatic and splenic arteries from the aorta. Reproductive: Unremarkable by CT. Other: No ascites. Musculoskeletal: No acute bone finding. No destructive bone process. Spinal degenerative changes. Lobular density along the posterior RIGHT back in the subcutaneous fat is unchanged at approximately 2.1 cm. Potentially mildly complicated sebaceous cyst unchanged since 2021. Signs of RIGHT thoracotomy as before. IMPRESSION: Stable LEFT juxta cardial soft tissue inseparable from pericardium and stable RIGHT hilar adenopathy. No new or progressive findings. Mildly enlarged RIGHT paratracheal lymph node is also unchanged. New tubular nodular density in the LEFT lower lobe. This appears to track along bronchial structures and is more compatible with infectious or inflammatory changes, attention on follow-up. Cholelithiasis without evidence of acute cholecystitis. Aortic Atherosclerosis (ICD10-I70.0) and Emphysema (ICD10-J43.9). Electronically Signed   By: Zetta Bills [name]   On: 10/20/2020 16:07     ASSESSMENT AND PLAN: This is a very pleasant 70 years old white male recently diagnosed with limited stage small cell lung cancer (T3, N2, M0) presented with pleural-based pulmonary nodules in the right lung in addition to mediastinal lymphadenopathy in August 2021.  The patient was initially diagnosed with a pleural-based nodule status post SBRT in March 2021. He completed systemic chemotherapy with carboplatin for AUC of 5 from day 1 and to etoposide 100 mg/M2 on days 1, 2 and 3 with Neulasta support every 3 weeks.  Status post 4 cycles. The patient tolerated his treatment well except for fatigue. He also underwent palliative radiotherapy to enlarging right lung nodule under the care of Dr. Sondra Come. The patient is currently on observation.  He had repeat CT scan of the chest  performed recently. I personally and independently reviewed the scan images and discussed the result and showed the images to the patient and his wife today. His scan showed new and enlarging bilateral pulmonary nodules and the enlarging right hilar lymph nodes consistent with metastatic disease. The patient is currently undergoing palliative systemic chemotherapy with carboplatin for AUC of 5 on day 1, etoposide 100 Mg/M2 on days 1, 2 and 3 with Cosela before the chemotherapy as well as Imfinzi 1500 Mg IV on day 1 every 3 weeks with the chemotherapy followed by maintenance every 4 weeks.  Status post 5 cycles.  Starting from cycle #5 the patient will be on maintenance treatment with single agent Imfinzi 1500  Mg IV every 4 weeks.  Cosela was discontinued after cycle #1 secondary to phlebitis and the patient refused to continue with the treatment. The patient has been tolerating this treatment well with no concerning adverse effects. I recommended for him to proceed with cycle #6 of his maintenance treatment with Imfinzi as planned. I will see him back for follow-up visit in 4 weeks for evaluation before starting cycle #7. The patient was advised to call immediately if he has any other concerning symptoms in the interval. The patient voices understanding of current disease status and treatment options and is in agreement with the current care plan.  All questions were answered. The patient knows to call the clinic with any problems, questions or concerns. We can certainly see the patient much sooner if necessary.  Disclaimer: This note was dictated with voice recognition software. Similar sounding words can inadvertently be transcribed and may not be corrected upon review.

## 2020-11-18 ENCOUNTER — Telehealth: Payer: Self-pay | Admitting: Internal Medicine

## 2020-11-18 NOTE — Telephone Encounter (Signed)
Left message with follow-up appointment per 10/31 los.

## 2020-11-25 ENCOUNTER — Other Ambulatory Visit: Payer: Self-pay | Admitting: Internal Medicine

## 2020-11-25 ENCOUNTER — Other Ambulatory Visit (HOSPITAL_COMMUNITY): Payer: Self-pay

## 2020-11-25 MED ORDER — POTASSIUM CHLORIDE CRYS ER 20 MEQ PO TBCR
20.0000 meq | EXTENDED_RELEASE_TABLET | Freq: Every day | ORAL | 0 refills | Status: DC
  Filled 2020-11-25: qty 10, 10d supply, fill #0

## 2020-11-26 ENCOUNTER — Other Ambulatory Visit (HOSPITAL_COMMUNITY): Payer: Self-pay

## 2020-12-03 ENCOUNTER — Other Ambulatory Visit (HOSPITAL_COMMUNITY): Payer: Self-pay

## 2020-12-03 MED ORDER — SPIRIVA RESPIMAT 2.5 MCG/ACT IN AERS
INHALATION_SPRAY | RESPIRATORY_TRACT | 11 refills | Status: AC
  Filled 2020-12-03: qty 4, 30d supply, fill #0
  Filled 2021-01-08: qty 4, 30d supply, fill #1
  Filled 2021-02-15: qty 4, 30d supply, fill #2
  Filled 2021-03-26: qty 4, 30d supply, fill #3
  Filled 2021-05-11: qty 4, 30d supply, fill #4

## 2020-12-04 ENCOUNTER — Other Ambulatory Visit (HOSPITAL_COMMUNITY): Payer: Self-pay

## 2020-12-05 ENCOUNTER — Other Ambulatory Visit (HOSPITAL_COMMUNITY): Payer: Self-pay

## 2020-12-08 ENCOUNTER — Telehealth: Payer: Self-pay | Admitting: *Deleted

## 2020-12-08 NOTE — Telephone Encounter (Signed)
CALLED PATIENT TO ALTER FU APPT. ON 01-07-21 DUE TO DR. KINARD BEING ON VACATION, RESCHEDULED FOR 01-14-21 @ 4:30 PM, LVM FOR A RETURN CALL

## 2020-12-14 ENCOUNTER — Inpatient Hospital Stay (HOSPITAL_BASED_OUTPATIENT_CLINIC_OR_DEPARTMENT_OTHER): Payer: Medicare Other | Admitting: Internal Medicine

## 2020-12-14 ENCOUNTER — Inpatient Hospital Stay: Payer: Medicare Other | Attending: Internal Medicine

## 2020-12-14 ENCOUNTER — Inpatient Hospital Stay: Payer: Medicare Other

## 2020-12-14 ENCOUNTER — Other Ambulatory Visit (HOSPITAL_COMMUNITY): Payer: Self-pay

## 2020-12-14 ENCOUNTER — Encounter: Payer: Self-pay | Admitting: Internal Medicine

## 2020-12-14 ENCOUNTER — Other Ambulatory Visit: Payer: Self-pay

## 2020-12-14 VITALS — BP 129/67 | HR 68 | Temp 98.0°F | Resp 20 | Ht 72.0 in | Wt 198.0 lb

## 2020-12-14 DIAGNOSIS — K219 Gastro-esophageal reflux disease without esophagitis: Secondary | ICD-10-CM | POA: Insufficient documentation

## 2020-12-14 DIAGNOSIS — Z7901 Long term (current) use of anticoagulants: Secondary | ICD-10-CM | POA: Diagnosis not present

## 2020-12-14 DIAGNOSIS — I252 Old myocardial infarction: Secondary | ICD-10-CM | POA: Diagnosis not present

## 2020-12-14 DIAGNOSIS — I2510 Atherosclerotic heart disease of native coronary artery without angina pectoris: Secondary | ICD-10-CM | POA: Diagnosis not present

## 2020-12-14 DIAGNOSIS — E876 Hypokalemia: Secondary | ICD-10-CM | POA: Insufficient documentation

## 2020-12-14 DIAGNOSIS — Z87891 Personal history of nicotine dependence: Secondary | ICD-10-CM | POA: Diagnosis not present

## 2020-12-14 DIAGNOSIS — Z7951 Long term (current) use of inhaled steroids: Secondary | ICD-10-CM | POA: Diagnosis not present

## 2020-12-14 DIAGNOSIS — Z923 Personal history of irradiation: Secondary | ICD-10-CM | POA: Insufficient documentation

## 2020-12-14 DIAGNOSIS — I509 Heart failure, unspecified: Secondary | ICD-10-CM | POA: Diagnosis not present

## 2020-12-14 DIAGNOSIS — C3491 Malignant neoplasm of unspecified part of right bronchus or lung: Secondary | ICD-10-CM | POA: Diagnosis not present

## 2020-12-14 DIAGNOSIS — Z5112 Encounter for antineoplastic immunotherapy: Secondary | ICD-10-CM | POA: Diagnosis present

## 2020-12-14 DIAGNOSIS — E119 Type 2 diabetes mellitus without complications: Secondary | ICD-10-CM | POA: Insufficient documentation

## 2020-12-14 DIAGNOSIS — Z7984 Long term (current) use of oral hypoglycemic drugs: Secondary | ICD-10-CM | POA: Diagnosis not present

## 2020-12-14 DIAGNOSIS — E7800 Pure hypercholesterolemia, unspecified: Secondary | ICD-10-CM | POA: Insufficient documentation

## 2020-12-14 DIAGNOSIS — I110 Hypertensive heart disease with heart failure: Secondary | ICD-10-CM | POA: Diagnosis not present

## 2020-12-14 DIAGNOSIS — Z79899 Other long term (current) drug therapy: Secondary | ICD-10-CM | POA: Insufficient documentation

## 2020-12-14 DIAGNOSIS — J449 Chronic obstructive pulmonary disease, unspecified: Secondary | ICD-10-CM | POA: Insufficient documentation

## 2020-12-14 DIAGNOSIS — C3490 Malignant neoplasm of unspecified part of unspecified bronchus or lung: Secondary | ICD-10-CM

## 2020-12-14 DIAGNOSIS — C3431 Malignant neoplasm of lower lobe, right bronchus or lung: Secondary | ICD-10-CM | POA: Insufficient documentation

## 2020-12-14 LAB — CBC WITH DIFFERENTIAL (CANCER CENTER ONLY)
Abs Immature Granulocytes: 0.02 10*3/uL (ref 0.00–0.07)
Basophils Absolute: 0 10*3/uL (ref 0.0–0.1)
Basophils Relative: 0 %
Eosinophils Absolute: 0.1 10*3/uL (ref 0.0–0.5)
Eosinophils Relative: 1 %
HCT: 36.4 % — ABNORMAL LOW (ref 39.0–52.0)
Hemoglobin: 12.2 g/dL — ABNORMAL LOW (ref 13.0–17.0)
Immature Granulocytes: 0 %
Lymphocytes Relative: 20 %
Lymphs Abs: 1.5 10*3/uL (ref 0.7–4.0)
MCH: 28.7 pg (ref 26.0–34.0)
MCHC: 33.5 g/dL (ref 30.0–36.0)
MCV: 85.6 fL (ref 80.0–100.0)
Monocytes Absolute: 0.8 10*3/uL (ref 0.1–1.0)
Monocytes Relative: 11 %
Neutro Abs: 4.9 10*3/uL (ref 1.7–7.7)
Neutrophils Relative %: 68 %
Platelet Count: 235 10*3/uL (ref 150–400)
RBC: 4.25 MIL/uL (ref 4.22–5.81)
RDW: 14.1 % (ref 11.5–15.5)
WBC Count: 7.4 10*3/uL (ref 4.0–10.5)
nRBC: 0 % (ref 0.0–0.2)

## 2020-12-14 LAB — CMP (CANCER CENTER ONLY)
ALT: 11 U/L (ref 0–44)
AST: 10 U/L — ABNORMAL LOW (ref 15–41)
Albumin: 3.3 g/dL — ABNORMAL LOW (ref 3.5–5.0)
Alkaline Phosphatase: 78 U/L (ref 38–126)
Anion gap: 10 (ref 5–15)
BUN: 12 mg/dL (ref 8–23)
CO2: 26 mmol/L (ref 22–32)
Calcium: 9 mg/dL (ref 8.9–10.3)
Chloride: 98 mmol/L (ref 98–111)
Creatinine: 0.94 mg/dL (ref 0.61–1.24)
GFR, Estimated: >60 mL/min (ref >60)
Glucose, Bld: 149 mg/dL — ABNORMAL HIGH (ref 70–99)
Potassium: 3.2 mmol/L — ABNORMAL LOW (ref 3.5–5.1)
Sodium: 134 mmol/L — ABNORMAL LOW (ref 135–145)
Total Bilirubin: 0.4 mg/dL (ref 0.3–1.2)
Total Protein: 7.4 g/dL (ref 6.5–8.1)

## 2020-12-14 LAB — TSH: TSH: 0.97 u[IU]/mL (ref 0.320–4.118)

## 2020-12-14 MED ORDER — NORTRIPTYLINE HCL 25 MG PO CAPS
25.0000 mg | ORAL_CAPSULE | Freq: Every day | ORAL | 0 refills | Status: DC
  Filled 2020-12-14: qty 90, 90d supply, fill #0

## 2020-12-14 MED ORDER — POTASSIUM CHLORIDE CRYS ER 20 MEQ PO TBCR
20.0000 meq | EXTENDED_RELEASE_TABLET | Freq: Every day | ORAL | 0 refills | Status: DC
  Filled 2020-12-14: qty 10, 10d supply, fill #0

## 2020-12-14 MED ORDER — SODIUM CHLORIDE 0.9 % IV SOLN
Freq: Once | INTRAVENOUS | Status: AC
Start: 2020-12-14 — End: 2020-12-14

## 2020-12-14 MED ORDER — SODIUM CHLORIDE 0.9 % IV SOLN
1500.0000 mg | Freq: Once | INTRAVENOUS | Status: AC
  Administered 2020-12-14: 12:00:00 1500 mg via INTRAVENOUS
  Filled 2020-12-14: qty 30

## 2020-12-14 NOTE — Patient Instructions (Signed)
[facility] MEDICAL ONCOLOGY  Discharge Instructions: Thank you for choosing [facility] to provide your oncology and hematology care.   If you have a lab appointment with the Cancer Center, please go directly to the Cancer Center and check in at the registration area.   Wear comfortable clothing and clothing appropriate for easy access to any Portacath or PICC line.   We strive to give you quality time with your provider. You may need to reschedule your appointment if you arrive late (15 or more minutes).  Arriving late affects you and other patients whose appointments are after yours.  Also, if you miss three or more appointments without notifying the office, you may be dismissed from the clinic at the provider's discretion.      For prescription refill requests, have your pharmacy contact our office and allow 72 hours for refills to be completed.    Today you received the following chemotherapy and/or immunotherapy agents Imfinzi      To help prevent nausea and vomiting after your treatment, we encourage you to take your nausea medication as directed.  BELOW ARE SYMPTOMS THAT SHOULD BE REPORTED IMMEDIATELY: *FEVER GREATER THAN 100.4 F (38 C) OR HIGHER *CHILLS OR SWEATING *NAUSEA AND VOMITING THAT IS NOT CONTROLLED WITH YOUR NAUSEA MEDICATION *UNUSUAL SHORTNESS OF BREATH *UNUSUAL BRUISING OR BLEEDING *URINARY PROBLEMS (pain or burning when urinating, or frequent urination) *BOWEL PROBLEMS (unusual diarrhea, constipation, pain near the anus) TENDERNESS IN MOUTH AND THROAT WITH OR WITHOUT PRESENCE OF ULCERS (sore throat, sores in mouth, or a toothache) UNUSUAL RASH, SWELLING OR PAIN  UNUSUAL VAGINAL DISCHARGE OR ITCHING   Items with * indicate a potential emergency and should be followed up as soon as possible or go to the Emergency Department if any problems should occur.  Please show the CHEMOTHERAPY ALERT CARD or IMMUNOTHERAPY ALERT CARD at check-in to the  Emergency Department and triage nurse.  Should you have questions after your visit or need to cancel or reschedule your appointment, please contact [facility] MEDICAL ONCOL[facility]  Dept: [phone]  and follow the prompts.  Office hours are 8:00 a.m. to 4:30 p.m. Monday - Friday. Please note that voicemails left after 4:00 p.m. may not be returned until the following business day.  We are closed weekends and major holidays. You have access to a nurse at all times for urgent questions. Please call the main number to the clinic Dept: [phone] and follow the prompts.   For any non-urgent questions, you may also contact your provider using MyChart. We now offer e-Visits for anyone 18 and older to request care online for non-urgent symptoms. For details visit [email or web address].   Also download the MyChart app! Go to the app store, search "MyChart", open the app, select [facility], and log in with your MyChart username and password.  Due to Covid, a mask is required upon entering the hospital/clinic. If you do not have a mask, one will be given to you upon arrival. For doctor visits, patients may have 1 support person aged 18 or older with them. For treatment visits, patients cannot have anyone with them due to current Covid guidelines and our immunocompromised population.   

## 2020-12-14 NOTE — Progress Notes (Signed)
[place] [phone]   [phone]  OFFICE PROGRESS NOTE  Jonathon Limbo, MD [address] [place] [address]  DIAGNOSIS: Relapsed small cell lung cancer initially diagnosed as limited stage (T3, N2, M0) small cell lung cancer presented with pulmonary nodules and pleural-based nodules in the right lung in addition to mediastinal lymphadenopathy diagnosed in August 2021.  The patient was previously treated without biopsy to a pleural-based nodule in the right lower lobe with SBRT in March 2021 but had evidence for disease recurrence and progression.  He has disease relapse in June 2022.  PRIOR THERAPY:  1) SBRT to right lower lobe pulmonary nodule in March 2021 under the care of Dr. Sondra Come. 2) Systemic chemotherapy with carboplatin for AUC of 5 on day 1 and etoposide 100 mg/M2 on days 1, 2 and 3 with Neulasta support every 3 weeks.  First cycle 10/07/2019.  Status post 4 cycles.  Last dose of the chemotherapy was given on December 09, 2019.  CURRENT THERAPY: Second line systemic chemotherapy with carboplatin for AUC of 5 on day 1, etoposide 100 Mg/M2 on days 1, 2 and 3 with Cosela before the chemotherapy in addition to Imfinzi 1500 Mg IV on day 1 every 3 weeks.  First dose July 27, 2020.  Status post 6 cycles.  Cosela was discontinued secondary to phlebitis.  Starting from cycle #5 the patient will be on maintenance treatment with Imfinzi 1500 Mg IV every 4 weeks.  INTERVAL HISTORY: Isaiha A Gaby 70 y.o. male returns to the clinic today for follow-up visit accompanied by his wife.  The patient is feeling fine today with no concerning complaints except for shortness of breath with exertion.  He denied having any current chest pain, cough or hemoptysis.  He denied having any fever or chills.  He has no nausea, vomiting, diarrhea or constipation.  He has no headache or visual changes.  He continues to tolerate his treatment with  immunotherapy fairly well.  The patient is here today for evaluation before starting cycle #7.  MEDICAL HISTORY: Past Medical History:  Diagnosis Date   Cancer ([place])    CHF (congestive heart failure) (HCC)    Chronic cough    COPD, severe (HCC)    PT DENIES REGULAR DAILY INHALER ANY PRN   Coronary artery disease    CVA (cerebral vascular accident) ([place]) 02/22/2011   LEFT BRAIN STEM PONTINE HEMORRHAGE--  RIGHT SIDED WEAKNESS   Diabetes mellitus without complication (HCC)    type 2   Dyspnea    GERD (gastroesophageal reflux disease)    Harsh voice quality    PT STATES NORMAL FOR HIM   High cholesterol    History of CHF (congestive heart failure) MONITORED BY DR Coletta Memos   DIASTOLIC   History of radiation therapy 03/13/19-03/20/19   SBRT Right Lung; Dr. Gery Pray   History of radiation therapy 02/17/20-02/28/20   Whole brain radiation; Dr. Gery Pray   History of radiation therapy 05/25/2020   05/18/2020-05/25/2020  SBRT Right Lung; Dr. Gery Pray   Hydrocele, left    Hypertension    NSTEMI (non-ST elevated myocardial infarction) ([place]) 12/2015   Pre-operative clearance    GIVEN BY DR Coletta Memos   Short of breath on exertion    Smoker    Weakness of right side of body    S/P CVA   FEB 2013    ALLERGIES:  is allergic to atorvastatin.  MEDICATIONS:  Current Outpatient  Medications  Medication Sig Dispense Refill   albuterol (PROVENTIL) (2.5 MG/3ML) 0.083% nebulizer solution Take 2.5 mg by nebulization every 4 (four) hours as needed for wheezing or shortness of breath.     Albuterol Sulfate (PROAIR RESPICLICK) 132 (90 BASE) MCG/ACT AEPB Inhale 2 puffs into the lungs 4 (four) times daily as needed. 1 each 5   amLODipine (NORVASC) 10 MG tablet TAKE 1 TABLET BY MOUTH ONCE A DAY 90 tablet 3   atorvastatin (LIPITOR) 20 MG tablet Take one tablet (20 mg dose) by mouth daily. 30 tablet 5   bisoprolol (ZEBETA) 5 MG tablet Take one tablet (5 mg dose) by mouth daily. 90 tablet 3    cloNIDine (CATAPRES) 0.3 MG tablet TAKE 1 TABLET BY MOUTH TWICE DAILY 180 tablet 3   Cyanocobalamin (VITAMIN B 12 PO) Take 1 tablet by mouth every morning.      cyclobenzaprine (FLEXERIL) 5 MG tablet TAKE 1 TABLET BY MOUTH AT BEDTIME AS NEEDED FOR MUSCLE SPASMS 30 tablet 3   dabigatran (PRADAXA) 150 MG CAPS capsule Take 150 mg by mouth 2 (two) times daily.     DULoxetine (CYMBALTA) 30 MG capsule Take one capsule (30 mg dose) by mouth daily. 90 capsule 3   gabapentin (NEURONTIN) 400 MG capsule TAKE 2 CAPSULES BY MOUTH THREE TIMES A DAY 180 capsule 7   hydrochlorothiazide (MICROZIDE) 12.5 MG capsule TAKE 1 CAPSULE (12.5 MG TOTAL) BY MOUTH DAILY. 90 capsule 2   hydrochlorothiazide (MICROZIDE) 12.5 MG capsule TAKE 1 CAPSULE (12.5 MG TOTAL) BY MOUTH DAILY. 90 capsule 2   losartan (COZAAR) 100 MG tablet TAKE 1 TABLET (100 MG TOTAL) BY MOUTH DAILY. 90 tablet 3   metFORMIN (GLUCOPHAGE) 500 MG tablet TAKE 1 TABLET BY MOUTH ONCE A DAY WITH BREAKFAST 90 tablet 3   nitroGLYCERIN (NITROSTAT) 0.4 MG SL tablet PLACE 1 TABLET UNDER THE TONGUE EVERY 5  MINUTES AS NEEDED AS DIRECTED. 25 tablet 5   nortriptyline (PAMELOR) 25 MG capsule TAKE 1 CAPSULE BY MOUTH EVERY NIGHT AT BEDTIME 90 capsule 0   pantoprazole (PROTONIX) 20 MG tablet TAKE 1 TABLET BY MOUTH DAILY 90 tablet 2   potassium chloride SA (KLOR-CON) 20 MEQ tablet Take 1 tablet by mouth once daily. 10 tablet 0   prochlorperazine (COMPAZINE) 10 MG tablet Take 1 tablet (10 mg total) by mouth every 6 (six) hours as needed for nausea or vomiting. 30 tablet 0   SYMBICORT 160-4.5 MCG/ACT inhaler INHALE 2 PUFFS INTO THE LUNGS TWICE DAILY 10.2 g 11   SYMBICORT 160-4.5 MCG/ACT inhaler INHALE 2 PUFFS INTO THE LUNGS TWICE DAILY 10.2 g 11   tamsulosin (FLOMAX) 0.4 MG CAPS capsule Take one capsule (0.4 mg dose) by mouth daily. 90 capsule 3   temazepam (RESTORIL) 15 MG capsule Take by mouth.     Tiotropium Bromide Monohydrate (SPIRIVA RESPIMAT) 2.5 MCG/ACT AERS Inhale two  puffs into the lungs as needed. 4 g 11   No current facility-administered medications for this visit.    SURGICAL HISTORY:  Past Surgical History:  Procedure Laterality Date   ABDOMINAL ANGIOGRAM  09/04/2012   Procedure: ABDOMINAL ANGIOGRAM;  Surgeon: Laverda Page, MD;  [facility] C[facility] LAB;  Service: Cardiovascular;;   CARDIAC CATHETERIZATION  05-02-2011  DR Johnsie Cancel   MILD NONOBSTRUCTIVE CAD/ LAD, OM , AND D1/ EF 60^   CARDIAC CATHETERIZATION  AUG 2000   NON-OBSTRUTIVE CAD/ EF 68%/ 25% PROXIMAL LAD/ 20% MID CIRCUMFLEX   CARDIAC CATHETERIZATION [name] 12/23/2015   Procedure: Left Heart  Cath and Coronary Angiography;  Surgeon: Adrian Prows, MD;  Location: [place] CV LAB;  Service: Cardiovascular;  Laterality: [name];   CARDIAC CATHETERIZATION [name] 12/24/2015   Procedure: Coronary Stent Intervention;  Surgeon: Adrian Prows, MD;  Location: [place] CV LAB;  Service: Cardiovascular;  Laterality: [name];   CORONARY ANGIOPLASTY     CORONARY STENT PLACEMENT  12/24/2015    PTCA and stenting of the distal RCA    HYDROCELE EXCISION Left 04/24/2012   Procedure: HYDROCELECTOMY ADULT;  Surgeon: Fredricka Bonine, MD;  Location: [facility];  Service: Urology;  Laterality: Left;  90 mins req for this case      [place]   LEFT AND RIGHT HEART CATHETERIZATION WITH CORONARY ANGIOGRAM [name] 09/04/2012   Procedure: LEFT AND RIGHT HEART CATHETERIZATION WITH CORONARY ANGIOGRAM;  Surgeon: Laverda Page, MD;  Location: [facility] CATH LAB;  Service: Cardiovascular;  Laterality: [name];   LEFT HEART CATHETERIZATION WITH CORONARY ANGIOGRAM [name] 05/02/2011   Procedure: LEFT HEART CATHETERIZATION WITH CORONARY ANGIOGRAM;  Surgeon: Josue Hector, MD;  Location: [facility] CATH LAB;  Service: Cardiovascular;  Laterality: [name];   THORACOTOMY/LOBECTOMY Right 03-06-2008   AND STAPLING OF BLEBS FOR SPONTANOUS PNEUMOTHORAX   TRANSTHORACIC ECHOCARDIOGRAM  02-17-2011   MODERATE LVH/ LVSF NORMAL/ EF 14-48%/  GRADE I DIASTOLIC DYSFUNCTION    REVIEW OF SYSTEMS:  A comprehensive review of systems was negative except for: Respiratory: positive for dyspnea on exertion   PHYSICAL EXAMINATION: General appearance: alert, cooperative, and no distress Head: Normocephalic, without obvious abnormality, atraumatic Neck: no adenopathy, no JVD, supple, symmetrical, trachea midline, and thyroid not enlarged, symmetric, no tenderness/mass/nodules Lymph nodes: Cervical, supraclavicular, and axillary nodes normal. Resp: clear to auscultation bilaterally Back: symmetric, no curvature. ROM normal. No CVA tenderness. Cardio: regular rate and rhythm, S1, S2 normal, no murmur, click, rub or gallop GI: soft, non-tender; bowel sounds normal; no masses,  no organomegaly Extremities: extremities normal, atraumatic, no cyanosis or edema  ECOG PERFORMANCE STATUS: 1 - Symptomatic but completely ambulatory  Blood pressure 129/67, pulse 68, temperature 98 F (36.7 C), temperature source Tympanic, resp. rate 20, height 6' (1.829 m), weight 198 lb (89.8 kg), SpO2 95 %.  LABORATORY DATA: Lab Results  Component Value Date   WBC 7.4 12/14/2020   HGB 12.2 (L) 12/14/2020   HCT 36.4 (L) 12/14/2020   MCV 85.6 12/14/2020   PLT 235 12/14/2020      Chemistry      Component Value Date/Time   NA 134 (L) 11/16/2020 0910   K 3.3 (L) 11/16/2020 0910   CL 98 11/16/2020 0910   CO2 26 11/16/2020 0910   BUN 8 11/16/2020 0910   CREATININE 0.86 11/16/2020 0910      Component Value Date/Time   CALCIUM 8.8 (L) 11/16/2020 0910   ALKPHOS 81 11/16/2020 0910   AST 11 (L) 11/16/2020 0910   ALT 10 11/16/2020 0910   BILITOT 0.5 11/16/2020 0910       RADIOGRAPHIC STUDIES: No results found.   ASSESSMENT AND PLAN: This is a very pleasant 70 years old white male recently diagnosed with limited stage small cell lung cancer (T3, N2, M0) presented with pleural-based pulmonary nodules in the right lung in addition to mediastinal  lymphadenopathy in August 2021.  The patient was initially diagnosed with a pleural-based nodule status post SBRT in March 2021. He completed systemic chemotherapy with carboplatin for AUC of 5 from day 1 and to etoposide 100 mg/M2 on days 1, 2  and 3 with Neulasta support every 3 weeks.  Status post 4 cycles. The patient tolerated his treatment well except for fatigue. He also underwent palliative radiotherapy to enlarging right lung nodule under the care of Dr. Sondra Come. The patient is currently on observation.  He had repeat CT scan of the chest performed recently. I personally and independently reviewed the scan images and discussed the result and showed the images to the patient and his wife today. His scan showed new and enlarging bilateral pulmonary nodules and the enlarging right hilar lymph nodes consistent with metastatic disease. The patient is currently undergoing palliative systemic chemotherapy with carboplatin for AUC of 5 on day 1, etoposide 100 Mg/M2 on days 1, 2 and 3 with Cosela before the chemotherapy as well as Imfinzi 1500 Mg IV on day 1 every 3 weeks with the chemotherapy followed by maintenance every 4 weeks.  Status post 6 cycles.  Starting from cycle #5 the patient will be on maintenance treatment with single agent Imfinzi 1500 Mg IV every 4 weeks.  Cosela was discontinued after cycle #1 secondary to phlebitis and the patient refused to continue with the treatment. The patient continues to tolerate his treatment with immunotherapy fairly well. I recommended for him to proceed with cycle #7 today as planned. I will see him back for follow-up visit in 4 weeks for evaluation with repeat CT scan of the chest, abdomen and pelvis for restaging of his disease. For the hypokalemia, we will refill his potassium chloride supplements The patient was advised to call immediately if he has any concerning symptoms in the interval. The patient voices understanding of current disease status and  treatment options and is in agreement with the current care plan.  All questions were answered. The patient knows to call the clinic with any problems, questions or concerns. We can certainly see the patient much sooner if necessary.  Disclaimer: This note was dictated with voice recognition software. Similar sounding words can inadvertently be transcribed and may not be corrected upon review.

## 2020-12-22 ENCOUNTER — Other Ambulatory Visit (HOSPITAL_COMMUNITY): Payer: Self-pay

## 2020-12-24 ENCOUNTER — Other Ambulatory Visit (HOSPITAL_COMMUNITY): Payer: Self-pay

## 2020-12-24 MED ORDER — CYCLOBENZAPRINE HCL 5 MG PO TABS
ORAL_TABLET | ORAL | 3 refills | Status: DC
  Filled 2020-12-24: qty 30, 30d supply, fill #0
  Filled 2021-01-21: qty 30, 30d supply, fill #1
  Filled 2021-02-23: qty 30, 30d supply, fill #2
  Filled 2021-03-30: qty 30, 30d supply, fill #3

## 2021-01-04 ENCOUNTER — Other Ambulatory Visit: Payer: Self-pay

## 2021-01-04 ENCOUNTER — Ambulatory Visit (HOSPITAL_COMMUNITY)
Admission: RE | Admit: 2021-01-04 | Discharge: 2021-01-04 | Disposition: A | Discharge status: Home / Self Care | Payer: Medicare Other | Source: Ambulatory Visit | Attending: Radiation Oncology | Admitting: Radiation Oncology

## 2021-01-04 DIAGNOSIS — C3491 Malignant neoplasm of unspecified part of right bronchus or lung: Secondary | ICD-10-CM | POA: Diagnosis not present

## 2021-01-04 MED ORDER — GADOBUTROL 1 MMOL/ML IV SOLN
9.0000 mL | Freq: Once | INTRAVENOUS | Status: AC | PRN
  Administered 2021-01-04: 16:00:00 9 mL via INTRAVENOUS

## 2021-01-07 ENCOUNTER — Ambulatory Visit: Payer: Self-pay | Admitting: Radiation Oncology

## 2021-01-07 ENCOUNTER — Ambulatory Visit (HOSPITAL_COMMUNITY)
Admission: RE | Admit: 2021-01-07 | Discharge: 2021-01-07 | Disposition: A | Discharge status: Home / Self Care | Payer: Medicare Other | Source: Ambulatory Visit | Attending: Internal Medicine | Admitting: Internal Medicine

## 2021-01-07 ENCOUNTER — Other Ambulatory Visit: Payer: Self-pay

## 2021-01-07 DIAGNOSIS — C3490 Malignant neoplasm of unspecified part of unspecified bronchus or lung: Secondary | ICD-10-CM | POA: Insufficient documentation

## 2021-01-07 MED ORDER — IOHEXOL 350 MG/ML SOLN
75.0000 mL | Freq: Once | INTRAVENOUS | Status: AC | PRN
  Administered 2021-01-07: 14:00:00 75 mL via INTRAVENOUS

## 2021-01-07 MED ORDER — SODIUM CHLORIDE (PF) 0.9 % IJ SOLN
INTRAMUSCULAR | Status: AC
  Filled 2021-01-07: qty 50

## 2021-01-08 ENCOUNTER — Other Ambulatory Visit: Payer: Self-pay | Admitting: Cardiology

## 2021-01-08 ENCOUNTER — Other Ambulatory Visit (HOSPITAL_COMMUNITY): Payer: Self-pay

## 2021-01-08 DIAGNOSIS — I10 Essential (primary) hypertension: Secondary | ICD-10-CM

## 2021-01-08 DIAGNOSIS — I7123 Aneurysm of the descending thoracic aorta, without rupture: Secondary | ICD-10-CM

## 2021-01-12 ENCOUNTER — Inpatient Hospital Stay: Payer: Medicare Other

## 2021-01-12 ENCOUNTER — Encounter: Payer: Self-pay | Admitting: Internal Medicine

## 2021-01-12 ENCOUNTER — Inpatient Hospital Stay (HOSPITAL_BASED_OUTPATIENT_CLINIC_OR_DEPARTMENT_OTHER): Payer: Medicare Other | Admitting: Internal Medicine

## 2021-01-12 ENCOUNTER — Other Ambulatory Visit: Payer: Self-pay

## 2021-01-12 ENCOUNTER — Inpatient Hospital Stay: Payer: Medicare Other | Attending: Internal Medicine

## 2021-01-12 ENCOUNTER — Other Ambulatory Visit (HOSPITAL_COMMUNITY): Payer: Self-pay

## 2021-01-12 VITALS — BP 122/63 | HR 64 | Temp 98.4°F | Resp 18 | Wt 196.5 lb

## 2021-01-12 DIAGNOSIS — C3491 Malignant neoplasm of unspecified part of right bronchus or lung: Secondary | ICD-10-CM | POA: Diagnosis not present

## 2021-01-12 DIAGNOSIS — E876 Hypokalemia: Secondary | ICD-10-CM | POA: Insufficient documentation

## 2021-01-12 DIAGNOSIS — Z79899 Other long term (current) drug therapy: Secondary | ICD-10-CM | POA: Diagnosis not present

## 2021-01-12 DIAGNOSIS — Z5112 Encounter for antineoplastic immunotherapy: Secondary | ICD-10-CM | POA: Diagnosis not present

## 2021-01-12 DIAGNOSIS — Z923 Personal history of irradiation: Secondary | ICD-10-CM | POA: Insufficient documentation

## 2021-01-12 DIAGNOSIS — Z9221 Personal history of antineoplastic chemotherapy: Secondary | ICD-10-CM | POA: Insufficient documentation

## 2021-01-12 DIAGNOSIS — Z87891 Personal history of nicotine dependence: Secondary | ICD-10-CM | POA: Diagnosis not present

## 2021-01-12 DIAGNOSIS — C3431 Malignant neoplasm of lower lobe, right bronchus or lung: Secondary | ICD-10-CM | POA: Insufficient documentation

## 2021-01-12 LAB — CMP (CANCER CENTER ONLY)
ALT: 8 U/L (ref 0–44)
AST: 8 U/L — ABNORMAL LOW (ref 15–41)
Albumin: 3.6 g/dL (ref 3.5–5.0)
Alkaline Phosphatase: 76 U/L (ref 38–126)
Anion gap: 5 (ref 5–15)
BUN: 8 mg/dL (ref 8–23)
CO2: 28 mmol/L (ref 22–32)
Calcium: 8.9 mg/dL (ref 8.9–10.3)
Chloride: 101 mmol/L (ref 98–111)
Creatinine: 0.87 mg/dL (ref 0.61–1.24)
GFR, Estimated: >60 mL/min (ref >60)
Glucose, Bld: 139 mg/dL — ABNORMAL HIGH (ref 70–99)
Potassium: 3.4 mmol/L — ABNORMAL LOW (ref 3.5–5.1)
Sodium: 134 mmol/L — ABNORMAL LOW (ref 135–145)
Total Bilirubin: 0.3 mg/dL (ref 0.3–1.2)
Total Protein: 7.4 g/dL (ref 6.5–8.1)

## 2021-01-12 LAB — CBC WITH DIFFERENTIAL (CANCER CENTER ONLY)
Abs Immature Granulocytes: 0.02 10*3/uL (ref 0.00–0.07)
Basophils Absolute: 0 10*3/uL (ref 0.0–0.1)
Basophils Relative: 0 %
Eosinophils Absolute: 0.1 10*3/uL (ref 0.0–0.5)
Eosinophils Relative: 1 %
HCT: 36.8 % — ABNORMAL LOW (ref 39.0–52.0)
Hemoglobin: 12.1 g/dL — ABNORMAL LOW (ref 13.0–17.0)
Immature Granulocytes: 0 %
Lymphocytes Relative: 19 %
Lymphs Abs: 1.4 10*3/uL (ref 0.7–4.0)
MCH: 27.6 pg (ref 26.0–34.0)
MCHC: 32.9 g/dL (ref 30.0–36.0)
MCV: 83.8 fL (ref 80.0–100.0)
Monocytes Absolute: 0.8 10*3/uL (ref 0.1–1.0)
Monocytes Relative: 11 %
Neutro Abs: 5.1 10*3/uL (ref 1.7–7.7)
Neutrophils Relative %: 69 %
Platelet Count: 259 10*3/uL (ref 150–400)
RBC: 4.39 MIL/uL (ref 4.22–5.81)
RDW: 14.2 % (ref 11.5–15.5)
WBC Count: 7.4 10*3/uL (ref 4.0–10.5)
nRBC: 0 % (ref 0.0–0.2)

## 2021-01-12 LAB — TSH: TSH: 1.835 u[IU]/mL (ref 0.320–4.118)

## 2021-01-12 MED ORDER — SODIUM CHLORIDE 0.9 % IV SOLN: Freq: Once | INTRAVENOUS | Status: AC

## 2021-01-12 MED ORDER — SODIUM CHLORIDE 0.9 % IV SOLN
1500.0000 mg | Freq: Once | INTRAVENOUS | Status: AC
  Administered 2021-01-12: 13:00:00 1500 mg via INTRAVENOUS
  Filled 2021-01-12: qty 30

## 2021-01-12 MED ORDER — POTASSIUM CHLORIDE CRYS ER 20 MEQ PO TBCR
20.0000 meq | EXTENDED_RELEASE_TABLET | Freq: Every day | ORAL | 0 refills | Status: DC
  Filled 2021-01-12: qty 10, 10d supply, fill #0

## 2021-01-12 MED ORDER — LOSARTAN POTASSIUM 100 MG PO TABS
ORAL_TABLET | Freq: Every day | ORAL | 3 refills | Status: AC
  Filled 2021-01-12: qty 90, 90d supply, fill #0
  Filled 2021-04-21: qty 90, 90d supply, fill #1

## 2021-01-12 NOTE — Addendum Note (Signed)
Addended by: Ardeen Garland on: 01/12/2021 12:59 PM   Modules accepted: Orders

## 2021-01-12 NOTE — Progress Notes (Signed)
[place] [phone]   [phone]  OFFICE PROGRESS NOTE  [name], MD [address] [place] [address]  DIAGNOSIS: Relapsed small cell lung cancer initially diagnosed as limited stage (T3, N2, M0) small cell lung cancer presented with pulmonary nodules and pleural-based nodules in the right lung in addition to mediastinal lymphadenopathy diagnosed in August 2021.  The patient was previously treated without biopsy to a pleural-based nodule in the right lower lobe with SBRT in March 2021 but had evidence for disease recurrence and progression.  He has disease relapse in June 2022.  PRIOR THERAPY:  1) SBRT to right lower lobe pulmonary nodule in March 2021 under the care of Dr. Sondra Come. 2) Systemic chemotherapy with carboplatin for AUC of 5 on day 1 and etoposide 100 mg/M2 on days 1, 2 and 3 with Neulasta support every 3 weeks.  First cycle 10/07/2019.  Status post 4 cycles.  Last dose of the chemotherapy was given on December 09, 2019.  CURRENT THERAPY: Second line systemic chemotherapy with carboplatin for AUC of 5 on day 1, etoposide 100 Mg/M2 on days 1, 2 and 3 with Cosela before the chemotherapy in addition to Imfinzi 1500 Mg IV on day 1 every 3 weeks.  First dose July 27, 2020.  Status post 7 cycles.  Cosela was discontinued secondary to phlebitis.  Starting from cycle #5 the patient will be on maintenance treatment with Imfinzi 1500 Mg IV every 4 weeks.  INTERVAL HISTORY: Jonathon [name] 70 y.o. male returns to the clinic today for follow-up visit accompanied by his wife.  The patient is feeling fine today with no concerning complaints except for fatigue.  He denied having any current chest pain but has the baseline shortness of breath increased with exertion.  He has no nausea, vomiting, diarrhea or constipation.  He has no headache or visual changes.  He has no significant weight loss or night sweats.  He has been  tolerating his treatment with Imfinzi fairly well.  The patient is here today for evaluation with repeat CT scan of the chest, abdomen and pelvis for restaging of his disease.  MEDICAL HISTORY: Past Medical History:  Diagnosis Date   Cancer ([place])    CHF (congestive heart failure) (HCC)    Chronic cough    COPD, severe (HCC)    PT DENIES REGULAR DAILY INHALER ANY PRN   Coronary artery disease    CVA (cerebral vascular accident) ([place]) 02/22/2011   LEFT BRAIN STEM PONTINE HEMORRHAGE--  RIGHT SIDED WEAKNESS   Diabetes mellitus without complication (HCC)    type 2   Dyspnea    GERD (gastroesophageal reflux disease)    Harsh voice quality    PT STATES NORMAL FOR HIM   High cholesterol    History of CHF (congestive heart failure) MONITORED BY DR Coletta Memos   DIASTOLIC   History of radiation therapy 03/13/19-03/20/19   SBRT Right Lung; Dr. Gery Pray   History of radiation therapy 02/17/20-02/28/20   Whole brain radiation; Dr. Gery Pray   History of radiation therapy 05/25/2020   05/18/2020-05/25/2020  SBRT Right Lung; Dr. Gery Pray   Hydrocele, left    Hypertension    NSTEMI (non-ST elevated myocardial infarction) ([place]) 12/2015   Pre-operative clearance    GIVEN BY DR Coletta Memos   Short of breath on exertion    Smoker    Weakness of right side of body    S/P CVA  FEB 2013    ALLERGIES:  is allergic to atorvastatin.  MEDICATIONS:  Current Outpatient Medications  Medication Sig Dispense Refill   albuterol (PROVENTIL) (2.5 MG/3ML) 0.083% nebulizer solution Take 2.5 mg by nebulization every 4 (four) hours as needed for wheezing or shortness of breath.     Albuterol Sulfate (PROAIR RESPICLICK) 102 (90 BASE) MCG/ACT AEPB Inhale 2 puffs into the lungs 4 (four) times daily as needed. 1 each 5   amLODipine (NORVASC) 10 MG tablet TAKE 1 TABLET BY MOUTH ONCE A DAY 90 tablet 3   atorvastatin (LIPITOR) 20 MG tablet Take one tablet (20 mg dose) by mouth daily. 30 tablet 5   bisoprolol  (ZEBETA) 5 MG tablet Take one tablet (5 mg dose) by mouth daily. 90 tablet 3   cloNIDine (CATAPRES) 0.3 MG tablet TAKE 1 TABLET BY MOUTH TWICE DAILY 180 tablet 3   Cyanocobalamin (VITAMIN B 12 PO) Take 1 tablet by mouth every morning.      cyclobenzaprine (FLEXERIL) 5 MG tablet TAKE 1 TABLET BY MOUTH AT BEDTIME AS NEEDED FOR MUSCLE SPASMS 30 tablet 3   dabigatran (PRADAXA) 150 MG CAPS capsule Take 150 mg by mouth 2 (two) times daily.     DULoxetine (CYMBALTA) 30 MG capsule Take one capsule (30 mg dose) by mouth daily. 90 capsule 3   gabapentin (NEURONTIN) 400 MG capsule TAKE 2 CAPSULES BY MOUTH THREE TIMES A DAY 180 capsule 7   hydrochlorothiazide (MICROZIDE) 12.5 MG capsule TAKE 1 CAPSULE (12.5 MG TOTAL) BY MOUTH DAILY. 90 capsule 2   losartan (COZAAR) 100 MG tablet TAKE 1 TABLET BY MOUTH DAILY. 90 tablet 3   metFORMIN (GLUCOPHAGE) 500 MG tablet TAKE 1 TABLET BY MOUTH ONCE A DAY WITH BREAKFAST 90 tablet 3   nitroGLYCERIN (NITROSTAT) 0.4 MG SL tablet PLACE 1 TABLET UNDER THE TONGUE EVERY 5  MINUTES AS NEEDED AS DIRECTED. 25 tablet 5   nortriptyline (PAMELOR) 25 MG capsule TAKE 1 CAPSULE BY MOUTH EVERY NIGHT AT BEDTIME 90 capsule 0   pantoprazole (PROTONIX) 20 MG tablet TAKE 1 TABLET BY MOUTH DAILY 90 tablet 2   potassium chloride SA (KLOR-CON) 20 MEQ tablet Take 1 tablet by mouth once daily. 10 tablet 0   prochlorperazine (COMPAZINE) 10 MG tablet Take 1 tablet (10 mg total) by mouth every 6 (six) hours as needed for nausea or vomiting. 30 tablet 0   SYMBICORT 160-4.5 MCG/ACT inhaler INHALE 2 PUFFS INTO THE LUNGS TWICE DAILY 10.2 g 11   SYMBICORT 160-4.5 MCG/ACT inhaler INHALE 2 PUFFS INTO THE LUNGS TWICE DAILY 10.2 g 11   tamsulosin (FLOMAX) 0.4 MG CAPS capsule Take one capsule (0.4 mg dose) by mouth daily. 90 capsule 3   temazepam (RESTORIL) 15 MG capsule Take by mouth.     Tiotropium Bromide Monohydrate (SPIRIVA RESPIMAT) 2.5 MCG/ACT AERS Inhale two puffs into the lungs as needed. 4 g 11   No  current facility-administered medications for this visit.    SURGICAL HISTORY:  Past Surgical History:  Procedure Laterality Date   ABDOMINAL ANGIOGRAM  09/04/2012   Procedure: ABDOMINAL ANGIOGRAM;  Surgeon: Laverda Page, MD;  Location: [facility] CATH LAB;  Service: Cardiovascular;;   CARDIAC CATHETERIZATION  05-02-2011  DR Johnsie Cancel   MILD NONOBSTRUCTIVE CAD/ LAD, OM , AND D1/ EF 60^   CARDIAC CATHETERIZATION  AUG 2000   NON-OBSTRUTIVE CAD/ EF 68%/ 25% PROXIMAL LAD/ 20% MID CIRCUMFLEX   CARDIAC CATHETERIZATION [name] 12/23/2015   Procedure: Left Heart Cath and Coronary Angiography;  Surgeon:  Adrian Prows, MD;  Location: [facility] CV LAB;  Service: Cardiovascular;  Laterality: [name];   CARDIAC CATHETERIZATION [name] 12/24/2015   Procedure: Coronary Stent Intervention;  Surgeon: Adrian Prows, MD;  Location: [facility] CV LAB;  Service: Cardiovascular;  Laterality: [name];   CORONARY ANGIOPLASTY     CORONARY STENT PLACEMENT  12/24/2015    PTCA and stenting of the distal RCA    HYDROCELE EXCISION Left 04/24/2012   Procedure: HYDROCELECTOMY ADULT;  Surgeon: Fredricka Bonine, MD;  Location: [facility];  Service: Urology;  Laterality: Left;  90 mins req for this case      [place]   LEFT AND RIGHT HEART CATHETERIZATION WITH CORONARY ANGIOGRAM [name] 09/04/2012   Procedure: LEFT AND RIGHT HEART CATHETERIZATION WITH CORONARY ANGIOGRAM;  Surgeon: Laverda Page, MD;  Location: [facility] CATH LAB;  Service: Cardiovascular;  Laterality: [name];   LEFT HEART CATHETERIZATION WITH CORONARY ANGIOGRAM [name] 05/02/2011   Procedure: LEFT HEART CATHETERIZATION WITH CORONARY ANGIOGRAM;  Surgeon: Josue Hector, MD;  Location: [facility] CATH LAB;  Service: Cardiovascular;  Laterality: [name];   THORACOTOMY/LOBECTOMY Right 03-06-2008   AND STAPLING OF BLEBS FOR SPONTANOUS PNEUMOTHORAX   TRANSTHORACIC ECHOCARDIOGRAM  02-17-2011   MODERATE LVH/ LVSF NORMAL/ EF 16-10%/ GRADE I DIASTOLIC DYSFUNCTION    REVIEW OF  SYSTEMS:  Constitutional: positive for fatigue Eyes: negative Ears, nose, mouth, throat, and face: negative Respiratory: negative Cardiovascular: negative Gastrointestinal: negative Genitourinary:negative Integument/breast: negative Hematologic/lymphatic: negative Musculoskeletal:negative Neurological: negative Behavioral/Psych: negative Endocrine: negative Allergic/Immunologic: negative   PHYSICAL EXAMINATION: General appearance: alert, cooperative, fatigued, and no distress Head: Normocephalic, without obvious abnormality, atraumatic Neck: no adenopathy, no JVD, supple, symmetrical, trachea midline, and thyroid not enlarged, symmetric, no tenderness/mass/nodules Lymph nodes: Cervical, supraclavicular, and axillary nodes normal. Resp: clear to auscultation bilaterally Back: symmetric, no curvature. ROM normal. No CVA tenderness. Cardio: regular rate and rhythm, S1, S2 normal, no murmur, click, rub or gallop GI: soft, non-tender; bowel sounds normal; no masses,  no organomegaly Extremities: extremities normal, atraumatic, no cyanosis or edema Neurologic: Alert and oriented X 3, normal strength and tone. Normal symmetric reflexes. Normal coordination and gait  ECOG PERFORMANCE STATUS: 1 - Symptomatic but completely ambulatory  Blood pressure 122/63, pulse 64, temperature 98.4 F (36.9 C), temperature source Oral, resp. rate 18, weight 196 lb 8 oz (89.1 kg), SpO2 97 %.  LABORATORY DATA: Lab Results  Component Value Date   WBC 7.4 01/12/2021   HGB 12.1 (L) 01/12/2021   HCT 36.8 (L) 01/12/2021   MCV 83.8 01/12/2021   PLT 259 01/12/2021      Chemistry      Component Value Date/Time   NA 134 (L) 01/12/2021 1013   K 3.4 (L) 01/12/2021 1013   CL 101 01/12/2021 1013   CO2 28 01/12/2021 1013   BUN 8 01/12/2021 1013   CREATININE 0.87 01/12/2021 1013      Component Value Date/Time   CALCIUM 8.9 01/12/2021 1013   ALKPHOS 76 01/12/2021 1013   AST 8 (L) 01/12/2021 1013   ALT 8  01/12/2021 1013   BILITOT 0.3 01/12/2021 1013       RADIOGRAPHIC STUDIES: CT Chest W Contrast  Result Date: 01/08/2021 CLINICAL DATA:  Small-cell lung cancer.  Restaging. EXAM: CT CHEST, ABDOMEN, AND PELVIS WITH CONTRAST TECHNIQUE: Multidetector CT imaging of the chest, abdomen and pelvis was performed following the standard protocol during bolus administration of intravenous contrast. CONTRAST:  53mL OMNIPAQUE IOHEXOL 350 MG/ML SOLN COMPARISON:  10/19/2020 FINDINGS:  CT CHEST FINDINGS Cardiovascular: The heart size is normal. No substantial pericardial effusion. Coronary artery calcification is evident. Insert calcium thoracic Mediastinum/Nodes: 10 mm short axis right paratracheal node on image 14/series 2 is stable. No other mediastinal lymphadenopathy. 19 mm right hilar node measured previously is mildly progressive in the interval measuring 27 mm today (35/2). No left hilar lymphadenopathy. There is no axillary lymphadenopathy. Lungs/Pleura: Centrilobular and paraseptal emphysema evident. Prominent bullous change noted in the both lung apices. Volume loss medial left upper lobe along the heart border again noted. Nodular component today identified on image 97/4 measuring 1.9 cm. This compares to 1.9 cm when remeasured in a similar fashion at the same level on the prior exam. Tubular nodule described previously in the anterior left lower lobe has a configuration suggesting chronic atelectasis or scar on today's study and is unchanged (image 123/4). No new suspicious pulmonary nodule or mass. There is no evidence of pleural effusion. Musculoskeletal: No worrisome lytic or sclerotic osseous abnormality. CT ABDOMEN PELVIS FINDINGS Hepatobiliary: No suspicious focal abnormality within the liver parenchyma. 11 mm calcified gallstone noted. No intrahepatic or extrahepatic biliary dilation. Pancreas: No focal mass lesion. No dilatation of the main duct. No intraparenchymal cyst. No peripancreatic edema. Spleen:  No splenomegaly. No focal mass lesion. Adrenals/Urinary Tract: No adrenal nodule or mass. Right kidney unremarkable. Similar appearance 2.8 cm cyst upper pole left kidney. No evidence for hydroureter. The urinary bladder appears normal for the degree of distention. Stomach/Bowel: Small to moderate hiatal hernia. Stomach otherwise unremarkable. Duodenum is normally positioned as is the ligament of Treitz. No small bowel wall thickening. No small bowel dilatation. The terminal ileum is normal. The appendix is normal. No gross colonic mass. No colonic wall thickening. Vascular/Lymphatic: There is moderate atherosclerotic calcification of the abdominal aorta without aneurysm. There is no gastrohepatic or hepatoduodenal ligament lymphadenopathy. No retroperitoneal or mesenteric lymphadenopathy. No pelvic sidewall lymphadenopathy. Reproductive: The prostate gland and seminal vesicles are unremarkable. Other: No intraperitoneal free fluid. Musculoskeletal: Small right groin hernia contains only fat. No worrisome lytic or sclerotic osseous abnormality. IMPRESSION: 1. Slight interval progression of right hilar lymph node. 2. Similar appearance of volume loss medial left upper lobe with nodular component today measuring 1.9 cm today, stable when measuring on the prior study in a similar fashion. 3. Cholelithiasis. 4. Small to moderate hiatal hernia. 5. Small right groin hernia contains only fat. 6. Aortic Atherosclerosis (ICD10-I70.0) and Emphysema (ICD10-J43.9). Electronically Signed   By: Misty Stanley [name]   On: 01/08/2021 11:19   MR Brain W Wo Contrast  Result Date: 01/05/2021 CLINICAL DATA:  Small cell lung cancer (SCLC), staging EXAM: MRI HEAD WITHOUT AND WITH CONTRAST TECHNIQUE: Multiplanar, multiecho pulse sequences of the brain and surrounding structures were obtained without and with intravenous contrast. CONTRAST:  52mL GADAVIST GADOBUTROL 1 MMOL/ML IV SOLN COMPARISON:  October 02, 2020 FINDINGS: Brain: There  is no acute infarction or intracranial hemorrhage. There is no intracranial mass, mass effect, or edema. There is no hydrocephalus or extra-axial fluid collection. No abnormal enhancement. Prominence of the ventricles and sulci reflects stable parenchymal volume loss. Left anterior frontal encephalomalacia. Additional patchy and confluent areas of T2 hyperintensity in the supratentorial greater than pontine white matter are nonspecific but probably reflects stable chronic microvascular ischemic changes. There are a few superimposed chronic small vessel infarcts. Scattered punctate foci of susceptibility are unchanged and likely reflect chronic microhemorrhages secondary to hypertension. Vascular: Major vessel flow voids at the skull base are preserved. Skull and upper  cervical spine: Normal marrow signal is preserved. Sinuses/Orbits: Paranasal sinuses are aerated. Orbits are unremarkable. Other: Sella is unremarkable. Right mastoid tip fluid opacification. IMPRESSION: No evidence of intracranial metastatic disease. Stable chronic findings detailed above. Electronically Signed   By: Macy Mis [name]   On: 01/05/2021 09:09   CT Abdomen Pelvis W Contrast  Result Date: 01/08/2021 CLINICAL DATA:  Small-cell lung cancer.  Restaging. EXAM: CT CHEST, ABDOMEN, AND PELVIS WITH CONTRAST TECHNIQUE: Multidetector CT imaging of the chest, abdomen and pelvis was performed following the standard protocol during bolus administration of intravenous contrast. CONTRAST:  55mL OMNIPAQUE IOHEXOL 350 MG/ML SOLN COMPARISON:  10/19/2020 FINDINGS: CT CHEST FINDINGS Cardiovascular: The heart size is normal. No substantial pericardial effusion. Coronary artery calcification is evident. Insert calcium thoracic Mediastinum/Nodes: 10 mm short axis right paratracheal node on image 14/series 2 is stable. No other mediastinal lymphadenopathy. 19 mm right hilar node measured previously is mildly progressive in the interval measuring 27 mm  today (35/2). No left hilar lymphadenopathy. There is no axillary lymphadenopathy. Lungs/Pleura: Centrilobular and paraseptal emphysema evident. Prominent bullous change noted in the both lung apices. Volume loss medial left upper lobe along the heart border again noted. Nodular component today identified on image 97/4 measuring 1.9 cm. This compares to 1.9 cm when remeasured in a similar fashion at the same level on the prior exam. Tubular nodule described previously in the anterior left lower lobe has a configuration suggesting chronic atelectasis or scar on today's study and is unchanged (image 123/4). No new suspicious pulmonary nodule or mass. There is no evidence of pleural effusion. Musculoskeletal: No worrisome lytic or sclerotic osseous abnormality. CT ABDOMEN PELVIS FINDINGS Hepatobiliary: No suspicious focal abnormality within the liver parenchyma. 11 mm calcified gallstone noted. No intrahepatic or extrahepatic biliary dilation. Pancreas: No focal mass lesion. No dilatation of the main duct. No intraparenchymal cyst. No peripancreatic edema. Spleen: No splenomegaly. No focal mass lesion. Adrenals/Urinary Tract: No adrenal nodule or mass. Right kidney unremarkable. Similar appearance 2.8 cm cyst upper pole left kidney. No evidence for hydroureter. The urinary bladder appears normal for the degree of distention. Stomach/Bowel: Small to moderate hiatal hernia. Stomach otherwise unremarkable. Duodenum is normally positioned as is the ligament of Treitz. No small bowel wall thickening. No small bowel dilatation. The terminal ileum is normal. The appendix is normal. No gross colonic mass. No colonic wall thickening. Vascular/Lymphatic: There is moderate atherosclerotic calcification of the abdominal aorta without aneurysm. There is no gastrohepatic or hepatoduodenal ligament lymphadenopathy. No retroperitoneal or mesenteric lymphadenopathy. No pelvic sidewall lymphadenopathy. Reproductive: The prostate gland  and seminal vesicles are unremarkable. Other: No intraperitoneal free fluid. Musculoskeletal: Small right groin hernia contains only fat. No worrisome lytic or sclerotic osseous abnormality. IMPRESSION: 1. Slight interval progression of right hilar lymph node. 2. Similar appearance of volume loss medial left upper lobe with nodular component today measuring 1.9 cm today, stable when measuring on the prior study in a similar fashion. 3. Cholelithiasis. 4. Small to moderate hiatal hernia. 5. Small right groin hernia contains only fat. 6. Aortic Atherosclerosis (ICD10-I70.0) and Emphysema (ICD10-J43.9). Electronically Signed   By: Misty Stanley [name]   On: 01/08/2021 11:19     ASSESSMENT AND PLAN: This is a very pleasant 70 years old white male recently diagnosed with limited stage small cell lung cancer (T3, N2, M0) presented with pleural-based pulmonary nodules in the right lung in addition to mediastinal lymphadenopathy in August 2021.  The patient was initially diagnosed with a pleural-based nodule status  post SBRT in March 2021. He completed systemic chemotherapy with carboplatin for AUC of 5 from day 1 and to etoposide 100 mg/M2 on days 1, 2 and 3 with Neulasta support every 3 weeks.  Status post 4 cycles. The patient tolerated his treatment well except for fatigue. He also underwent palliative radiotherapy to enlarging right lung nodule under the care of Dr. Sondra Come. The patient is currently on observation.  He had repeat CT scan of the chest performed recently. I personally and independently reviewed the scan images and discussed the result and showed the images to the patient and his wife today. His scan showed new and enlarging bilateral pulmonary nodules and the enlarging right hilar lymph nodes consistent with metastatic disease. The patient is currently undergoing palliative systemic chemotherapy with carboplatin for AUC of 5 on day 1, etoposide 100 Mg/M2 on days 1, 2 and 3 with Cosela before the  chemotherapy as well as Imfinzi 1500 Mg IV on day 1 every 3 weeks with the chemotherapy followed by maintenance every 4 weeks.  Status post 7 cycles.  Starting from cycle #5 the patient will be on maintenance treatment with single agent Imfinzi 1500 Mg IV every 4 weeks.  Cosela was discontinued after cycle #1 secondary to phlebitis and the patient refused to continue with the treatment. The patient has been tolerating this treatment well with no concerning adverse effects. He had repeat CT scan of the chest, abdomen pelvis performed recently.  I personally and independently reviewed the scan images and discussed the result and showed the images to the patient and his wife. His scan showed a stable disease except for a slight increase in right hilar lymph node. I recommended for the patient to continue his treatment with Imfinzi for now and I will monitor the right hilar lymph node closely on upcoming imaging studies and if it continues to increase in size, I may consider referring the patient to Dr. Sondra Come for palliative radiotherapy. For the hypokalemia, we will refill his potassium chloride supplements The patient will come back for follow-up visit in 4 weeks for evaluation before starting cycle #9. He was advised to call immediately if he has any concerning symptoms in the interval. The patient voices understanding of current disease status and treatment options and is in agreement with the current care plan.  All questions were answered. The patient knows to call the clinic with any problems, questions or concerns. We can certainly see the patient much sooner if necessary.  Disclaimer: This note was dictated with voice recognition software. Similar sounding words can inadvertently be transcribed and may not be corrected upon review.

## 2021-01-12 NOTE — Patient Instructions (Signed)
[facility]COL[facility]  Discharge Instructions: Thank you for choosing [facility] to provide your oncology and hematology care.   If you have a lab appointment with the Cancer Center, please go directly to the Cancer Center and check in at the registration area.   Wear comfortable clothing and clothing appropriate for easy access to any Portacath or PICC line.   We strive to give you quality time with your provider. You may need to reschedule your appointment if you arrive late (15 or more minutes).  Arriving late affects you and other patients whose appointments are after yours.  Also, if you miss three or more appointments without notifying the office, you may be dismissed from the clinic at the provider's discretion.      For prescription refill requests, have your pharmacy contact our office and allow 72 hours for refills to be completed.    Today you received the following chemotherapy and/or immunotherapy agents Imfinzi      To help prevent nausea and vomiting after your treatment, we encourage you to take your nausea medication as directed.  BELOW ARE SYMPTOMS THAT SHOULD BE REPORTED IMMEDIATELY: *FEVER GREATER THAN 100.4 F (38 C) OR HIGHER *CHILLS OR SWEATING *NAUSEA AND VOMITING THAT IS NOT CONTROLLED WITH YOUR NAUSEA MEDICATION *UNUSUAL SHORTNESS OF BREATH *UNUSUAL BRUISING OR BLEEDING *URINARY PROBLEMS (pain or burning when urinating, or frequent urination) *BOWEL PROBLEMS (unusual diarrhea, constipation, pain near the anus) TENDERNESS IN MOUTH AND THROAT WITH OR WITHOUT PRESENCE OF ULCERS (sore throat, sores in mouth, or a toothache) UNUSUAL RASH, SWELLING OR PAIN  UNUSUAL VAGINAL DISCHARGE OR ITCHING   Items with * indicate a potential emergency and should be followed up as soon as possible or go to the Emergency Department if any problems should occur.  Please show the CHEMOTHERAPY ALERT CARD or IMMUNOTHERAPY ALERT CARD at check-in to the  Emergency Department and triage nurse.  Should you have questions after your visit or need to cancel or reschedule your appointment, please contact [facility]  Dept: [phone]  and follow the prompts.  Office hours are 8:00 a.m. to 4:30 p.m. Monday - Friday. Please note that voicemails left after 4:00 p.m. may not be returned until the following business day.  We are closed weekends and major holidays. You have access to a nurse at all times for urgent questions. Please call the main number to the clinic Dept: [phone] and follow the prompts.   For any non-urgent questions, you may also contact your provider using MyChart. We now offer e-Visits for anyone 18 and older to request care online for non-urgent symptoms. For details visit [email or web address].   Also download the MyChart app! Go to the app store, search "MyChart", open the app, select [place], and log in with your MyChart username and password.  Due to Covid, a mask is required upon entering the hospital/clinic. If you do not have a mask, one will be given to you upon arrival. For doctor visits, patients may have 1 support person aged 18 or older with them. For treatment visits, patients cannot have anyone with them due to current Covid guidelines and our immunocompromised population.   

## 2021-01-13 NOTE — Progress Notes (Signed)
Radiation Oncology         [phone] ________________________________  Name: Jonathon [name] MRN: [id number]  Date: 01/14/2021  DOB: 08/26/1950  Follow-Up Visit Note  CC: [name], MD  [name], MD    ICD-10-CM   1. Primary lung small cell carcinoma, right (HCC)  C34.91 MR Brain W Wo Contrast      Diagnosis: Limited stage (T3, N2, M0) small cell lung cancer presented with pulmonary nodules and pleural-based nodules in the right lung in addition to mediastinal lymphadenopathy diagnosed in August 2021  Interval Since Last Radiation: 7 months and 20 days   Intent: Curative  Radiation Treatment Dates: 05/18/2020 through 05/25/2020 Site Technique Total Dose (Gy) Dose per Fx (Gy) Completed Fx Beam Energies  Lung, Right: Lung_Rt IMRT 54/54 18 3/3 6XFFF    Narrative:  The patient returns today for 3 month follow-up (he was last seen here for follow up on 10/05/20). Since he was last seen, the patient has maintained close follow up with Dr. Julien Nordmann. During follow up with Dr. Julien Nordmann on 10/20/20, the patient was noted to be doing well of then ongoing fatigue. The patient also started cycle 5 of his systemic treatment during this visit. Starting from cycle 5 and on, the patient was switched to maintenance treatment with Imfinzi every 4 weeks.   During his most recent follow up with Dr. Julien Nordmann on 01/12/21, the patient again reported ongoing fatigue and denied any other symptoms other than SOB on exertion. The patient was also noted to tolerate Imfinzi fairly well.        Pertinent imaging since the patient was last seen is as follows:  -- CT of the chest abdomen and pelvis on 10/19/20 revealed: stable left juxta cardial soft tissue to appear inseparable from pericardium, and stable right hilar adenopathy. A mildly enlarged right paratracheal lymph node was noted as unchanged in the interval. However, a new tubular nodular density in the left lower lobe was appreciated as  tracking along bronchial structures; and noted as compatible with infectious or inflammatory changes. No new or progressive findings were otherwise appreciated.  -- MRI of the brain on 01/04/21 showed no evidence of intracranial metastatic disease.             -- CT of the chest abdomen and pelvis on 01/07/21 demonstrated slight interval progression of the right hilar lymph node. CT also showed the similar appearance of volume loss in the medial left upper lobe, with the nodular component measuring 1.9 cm.        He denies any headaches or visual problems.  He denies any dizziness.  His only complaint today is ongoing fatigue.  He denies any pain within the chest area significant cough or hemoptysis.    Allergies:  is allergic to atorvastatin.  Meds: Current Outpatient Medications  Medication Sig Dispense Refill   albuterol (PROVENTIL) (2.5 MG/3ML) 0.083% nebulizer solution Take 2.5 mg by nebulization every 4 (four) hours as needed for wheezing or shortness of breath.     Albuterol Sulfate (PROAIR RESPICLICK) 892 (90 BASE) MCG/ACT AEPB Inhale 2 puffs into the lungs 4 (four) times daily as needed. 1 each 5   amLODipine (NORVASC) 10 MG tablet TAKE 1 TABLET BY MOUTH ONCE A DAY 90 tablet 3   atorvastatin (LIPITOR) 20 MG tablet Take one tablet (20 mg dose) by mouth daily. 30 tablet 5   bisoprolol (ZEBETA) 5 MG tablet Take one tablet (5 mg dose) by mouth daily. 90 tablet 3  cloNIDine (CATAPRES) 0.3 MG tablet TAKE 1 TABLET BY MOUTH TWICE DAILY 180 tablet 3   Cyanocobalamin (VITAMIN B 12 PO) Take 1 tablet by mouth every morning.      cyclobenzaprine (FLEXERIL) 5 MG tablet TAKE 1 TABLET BY MOUTH AT BEDTIME AS NEEDED FOR MUSCLE SPASMS 30 tablet 3   dabigatran (PRADAXA) 150 MG CAPS capsule Take 150 mg by mouth 2 (two) times daily.     DULoxetine (CYMBALTA) 30 MG capsule Take one capsule (30 mg dose) by mouth daily. 90 capsule 3   gabapentin (NEURONTIN) 400 MG capsule TAKE 2 CAPSULES BY MOUTH THREE TIMES  A DAY 180 capsule 7   losartan (COZAAR) 100 MG tablet TAKE 1 TABLET BY MOUTH DAILY. 90 tablet 3   metFORMIN (GLUCOPHAGE) 500 MG tablet TAKE 1 TABLET BY MOUTH ONCE A DAY WITH BREAKFAST 90 tablet 3   nitroGLYCERIN (NITROSTAT) 0.4 MG SL tablet PLACE 1 TABLET UNDER THE TONGUE EVERY 5  MINUTES AS NEEDED AS DIRECTED. 25 tablet 5   nortriptyline (PAMELOR) 25 MG capsule TAKE 1 CAPSULE BY MOUTH EVERY NIGHT AT BEDTIME 90 capsule 0   pantoprazole (PROTONIX) 20 MG tablet TAKE 1 TABLET BY MOUTH DAILY 90 tablet 2   potassium chloride SA (KLOR-CON M) 20 MEQ tablet Take 1 tablet by mouth once daily. 10 tablet 0   prochlorperazine (COMPAZINE) 10 MG tablet Take 1 tablet (10 mg total) by mouth every 6 (six) hours as needed for nausea or vomiting. 30 tablet 0   SYMBICORT 160-4.5 MCG/ACT inhaler INHALE 2 PUFFS INTO THE LUNGS TWICE DAILY 10.2 g 11   SYMBICORT 160-4.5 MCG/ACT inhaler INHALE 2 PUFFS INTO THE LUNGS TWICE DAILY 10.2 g 11   tamsulosin (FLOMAX) 0.4 MG CAPS capsule Take one capsule (0.4 mg dose) by mouth daily. 90 capsule 3   temazepam (RESTORIL) 15 MG capsule Take by mouth.     Tiotropium Bromide Monohydrate (SPIRIVA RESPIMAT) 2.5 MCG/ACT AERS Inhale two puffs into the lungs as needed. 4 g 11   No current facility-administered medications for this encounter.    Physical Findings: The patient is in no acute distress. Patient is alert and oriented.  height is 6' (1.829 m) and weight is 192 lb (87.1 kg). His oral temperature is 98.2 F (36.8 C). His blood pressure is 149/82 (abnormal) and his pulse is 69. His respiration is 20 and oxygen saturation is 97%. .  No significant changes. Lungs are clear to auscultation bilaterally. Heart has regular rate and rhythm. No palpable cervical, supraclavicular, or axillary adenopathy. Abdomen soft, non-tender, normal bowel sounds.  The neurological examination is nonfocal.   Lab Findings: Lab Results  Component Value Date   WBC 7.4 01/12/2021   HGB 12.1 (L)  01/12/2021   HCT 36.8 (L) 01/12/2021   MCV 83.8 01/12/2021   PLT 259 01/12/2021    Radiographic Findings: CT Chest W Contrast  Result Date: 01/08/2021 CLINICAL DATA:  Small-cell lung cancer.  Restaging. EXAM: CT CHEST, ABDOMEN, AND PELVIS WITH CONTRAST TECHNIQUE: Multidetector CT imaging of the chest, abdomen and pelvis was performed following the standard protocol during bolus administration of intravenous contrast. CONTRAST:  24mL OMNIPAQUE IOHEXOL 350 MG/ML SOLN COMPARISON:  10/19/2020 FINDINGS: CT CHEST FINDINGS Cardiovascular: The heart size is normal. No substantial pericardial effusion. Coronary artery calcification is evident. Insert calcium thoracic Mediastinum/Nodes: 10 mm short axis right paratracheal node on image 14/series 2 is stable. No other mediastinal lymphadenopathy. 19 mm right hilar node measured previously is mildly progressive in the interval measuring  27 mm today (35/2). No left hilar lymphadenopathy. There is no axillary lymphadenopathy. Lungs/Pleura: Centrilobular and paraseptal emphysema evident. Prominent bullous change noted in the both lung apices. Volume loss medial left upper lobe along the heart border again noted. Nodular component today identified on image 97/4 measuring 1.9 cm. This compares to 1.9 cm when remeasured in a similar fashion at the same level on the prior exam. Tubular nodule described previously in the anterior left lower lobe has a configuration suggesting chronic atelectasis or scar on today's study and is unchanged (image 123/4). No new suspicious pulmonary nodule or mass. There is no evidence of pleural effusion. Musculoskeletal: No worrisome lytic or sclerotic osseous abnormality. CT ABDOMEN PELVIS FINDINGS Hepatobiliary: No suspicious focal abnormality within the liver parenchyma. 11 mm calcified gallstone noted. No intrahepatic or extrahepatic biliary dilation. Pancreas: No focal mass lesion. No dilatation of the main duct. No intraparenchymal cyst.  No peripancreatic edema. Spleen: No splenomegaly. No focal mass lesion. Adrenals/Urinary Tract: No adrenal nodule or mass. Right kidney unremarkable. Similar appearance 2.8 cm cyst upper pole left kidney. No evidence for hydroureter. The urinary bladder appears normal for the degree of distention. Stomach/Bowel: Small to moderate hiatal hernia. Stomach otherwise unremarkable. Duodenum is normally positioned as is the ligament of Treitz. No small bowel wall thickening. No small bowel dilatation. The terminal ileum is normal. The appendix is normal. No gross colonic mass. No colonic wall thickening. Vascular/Lymphatic: There is moderate atherosclerotic calcification of the abdominal aorta without aneurysm. There is no gastrohepatic or hepatoduodenal ligament lymphadenopathy. No retroperitoneal or mesenteric lymphadenopathy. No pelvic sidewall lymphadenopathy. Reproductive: The prostate gland and seminal vesicles are unremarkable. Other: No intraperitoneal free fluid. Musculoskeletal: Small right groin hernia contains only fat. No worrisome lytic or sclerotic osseous abnormality. IMPRESSION: 1. Slight interval progression of right hilar lymph node. 2. Similar appearance of volume loss medial left upper lobe with nodular component today measuring 1.9 cm today, stable when measuring on the prior study in a similar fashion. 3. Cholelithiasis. 4. Small to moderate hiatal hernia. 5. Small right groin hernia contains only fat. 6. Aortic Atherosclerosis (ICD10-I70.0) and Emphysema (ICD10-J43.9). Electronically Signed   By: Misty Stanley [name]   On: 01/08/2021 11:19   MR Brain W Wo Contrast  Result Date: 01/05/2021 CLINICAL DATA:  Small cell lung cancer (SCLC), staging EXAM: MRI HEAD WITHOUT AND WITH CONTRAST TECHNIQUE: Multiplanar, multiecho pulse sequences of the brain and surrounding structures were obtained without and with intravenous contrast. CONTRAST:  49mL GADAVIST GADOBUTROL 1 MMOL/ML IV SOLN COMPARISON:   October 02, 2020 FINDINGS: Brain: There is no acute infarction or intracranial hemorrhage. There is no intracranial mass, mass effect, or edema. There is no hydrocephalus or extra-axial fluid collection. No abnormal enhancement. Prominence of the ventricles and sulci reflects stable parenchymal volume loss. Left anterior frontal encephalomalacia. Additional patchy and confluent areas of T2 hyperintensity in the supratentorial greater than pontine white matter are nonspecific but probably reflects stable chronic microvascular ischemic changes. There are a few superimposed chronic small vessel infarcts. Scattered punctate foci of susceptibility are unchanged and likely reflect chronic microhemorrhages secondary to hypertension. Vascular: Major vessel flow voids at the skull base are preserved. Skull and upper cervical spine: Normal marrow signal is preserved. Sinuses/Orbits: Paranasal sinuses are aerated. Orbits are unremarkable. Other: Sella is unremarkable. Right mastoid tip fluid opacification. IMPRESSION: No evidence of intracranial metastatic disease. Stable chronic findings detailed above. Electronically Signed   By: Macy Mis [name]   On: 01/05/2021 09:09   CT  Abdomen Pelvis W Contrast  Result Date: 01/08/2021 CLINICAL DATA:  Small-cell lung cancer.  Restaging. EXAM: CT CHEST, ABDOMEN, AND PELVIS WITH CONTRAST TECHNIQUE: Multidetector CT imaging of the chest, abdomen and pelvis was performed following the standard protocol during bolus administration of intravenous contrast. CONTRAST:  74mL OMNIPAQUE IOHEXOL 350 MG/ML SOLN COMPARISON:  10/19/2020 FINDINGS: CT CHEST FINDINGS Cardiovascular: The heart size is normal. No substantial pericardial effusion. Coronary artery calcification is evident. Insert calcium thoracic Mediastinum/Nodes: 10 mm short axis right paratracheal node on image 14/series 2 is stable. No other mediastinal lymphadenopathy. 19 mm right hilar node measured previously is mildly  progressive in the interval measuring 27 mm today (35/2). No left hilar lymphadenopathy. There is no axillary lymphadenopathy. Lungs/Pleura: Centrilobular and paraseptal emphysema evident. Prominent bullous change noted in the both lung apices. Volume loss medial left upper lobe along the heart border again noted. Nodular component today identified on image 97/4 measuring 1.9 cm. This compares to 1.9 cm when remeasured in a similar fashion at the same level on the prior exam. Tubular nodule described previously in the anterior left lower lobe has a configuration suggesting chronic atelectasis or scar on today's study and is unchanged (image 123/4). No new suspicious pulmonary nodule or mass. There is no evidence of pleural effusion. Musculoskeletal: No worrisome lytic or sclerotic osseous abnormality. CT ABDOMEN PELVIS FINDINGS Hepatobiliary: No suspicious focal abnormality within the liver parenchyma. 11 mm calcified gallstone noted. No intrahepatic or extrahepatic biliary dilation. Pancreas: No focal mass lesion. No dilatation of the main duct. No intraparenchymal cyst. No peripancreatic edema. Spleen: No splenomegaly. No focal mass lesion. Adrenals/Urinary Tract: No adrenal nodule or mass. Right kidney unremarkable. Similar appearance 2.8 cm cyst upper pole left kidney. No evidence for hydroureter. The urinary bladder appears normal for the degree of distention. Stomach/Bowel: Small to moderate hiatal hernia. Stomach otherwise unremarkable. Duodenum is normally positioned as is the ligament of Treitz. No small bowel wall thickening. No small bowel dilatation. The terminal ileum is normal. The appendix is normal. No gross colonic mass. No colonic wall thickening. Vascular/Lymphatic: There is moderate atherosclerotic calcification of the abdominal aorta without aneurysm. There is no gastrohepatic or hepatoduodenal ligament lymphadenopathy. No retroperitoneal or mesenteric lymphadenopathy. No pelvic sidewall  lymphadenopathy. Reproductive: The prostate gland and seminal vesicles are unremarkable. Other: No intraperitoneal free fluid. Musculoskeletal: Small right groin hernia contains only fat. No worrisome lytic or sclerotic osseous abnormality. IMPRESSION: 1. Slight interval progression of right hilar lymph node. 2. Similar appearance of volume loss medial left upper lobe with nodular component today measuring 1.9 cm today, stable when measuring on the prior study in a similar fashion. 3. Cholelithiasis. 4. Small to moderate hiatal hernia. 5. Small right groin hernia contains only fat. 6. Aortic Atherosclerosis (ICD10-I70.0) and Emphysema (ICD10-J43.9). Electronically Signed   By: Misty Stanley [name]   On: 01/08/2021 11:19    Impression:  Limited stage (T3, N2, M0) small cell lung cancer presented with pulmonary nodules and pleural-based nodules in the right lung in addition to mediastinal lymphadenopathy diagnosed in August 2021  Recent MRI of the brain shows no cranial metastasis.  He does have a slight interval progression of a right hilar node which Dr. Julien Nordmann is following closely.  If this increases on next set of scans and the patient will likely be referred to radiation oncology for treatment to this area.  Patient does wish to continue regular MRIs of the brain for follow-up of potential metastasis to this area.  He has been  scheduled for another scan in approximately 3 months and we will follow-up soon afterward.  Plan: MRI of the brain in 3 months and follow-up soon afterward to review.   20 minutes of total time was spent for this patient encounter, including preparation, face-to-face counseling with the patient and coordination of care, physical exam, ordering of upcoming scans and documentation of the encounter. ____________________________________  Blair Promise, PhD, MD  This document serves as a record of services personally performed by Gery Pray, MD. It was created on his behalf by  Roney Mans, a trained medical scribe. The creation of this record is based on the scribe's personal observations and the provider's statements to them. This document has been checked and approved by the attending provider.

## 2021-01-14 ENCOUNTER — Ambulatory Visit
Admission: RE | Admit: 2021-01-14 | Discharge: 2021-01-14 | Disposition: A | Discharge status: Home / Self Care | Payer: Medicare Other | Source: Ambulatory Visit | Attending: Radiation Oncology | Admitting: Radiation Oncology

## 2021-01-14 ENCOUNTER — Encounter: Payer: Self-pay | Admitting: Radiation Oncology

## 2021-01-14 ENCOUNTER — Other Ambulatory Visit: Payer: Self-pay

## 2021-01-14 DIAGNOSIS — C3491 Malignant neoplasm of unspecified part of right bronchus or lung: Secondary | ICD-10-CM | POA: Diagnosis not present

## 2021-01-14 NOTE — Progress Notes (Signed)
Jonathon [name] is here today for follow up post radiation to the lung.  Lung Side: Right,patient completed treatment on 05/25/20  Does the patient complain of any of the following: Pain:Patient denies pain.  Shortness of breath w/wo exertion: Yes, on exertion.  Cough: no Hemoptysis: no Pain with swallowing: no Swallowing/choking concerns: no Appetite: Fair  Energy Level: low energy level. Patient states he is very weak.  Post radiation skin Changes: no, skin remains intact.   Weight-  Wt Readings from Last 3 Encounters:  01/14/21 192 lb (87.1 kg)  01/12/21 196 lb 8 oz (89.1 kg)  12/14/20 198 lb (89.8 kg)     Additional comments if applicable:  Vitals:   78/58/85 1341  BP: (!) 149/82  Pulse: 69  Resp: 20  Temp: 98.2 F (36.8 C)  TempSrc: Oral  SpO2: 97%  Weight: 192 lb (87.1 kg)  Height: 6' (1.829 m)

## 2021-01-21 ENCOUNTER — Other Ambulatory Visit (HOSPITAL_COMMUNITY): Payer: Self-pay

## 2021-01-28 ENCOUNTER — Other Ambulatory Visit: Payer: Self-pay | Admitting: Internal Medicine

## 2021-01-28 ENCOUNTER — Other Ambulatory Visit (HOSPITAL_COMMUNITY): Payer: Self-pay

## 2021-01-28 MED ORDER — POTASSIUM CHLORIDE CRYS ER 20 MEQ PO TBCR
20.0000 meq | EXTENDED_RELEASE_TABLET | Freq: Every day | ORAL | 0 refills | Status: DC
  Filled 2021-01-28 – 2021-02-22 (×2): qty 10, 10d supply, fill #0

## 2021-02-08 ENCOUNTER — Inpatient Hospital Stay: Payer: Medicare Other | Attending: Internal Medicine

## 2021-02-08 ENCOUNTER — Inpatient Hospital Stay: Payer: Medicare Other

## 2021-02-08 ENCOUNTER — Encounter: Payer: Self-pay | Admitting: Internal Medicine

## 2021-02-08 ENCOUNTER — Other Ambulatory Visit (HOSPITAL_COMMUNITY): Payer: Self-pay

## 2021-02-08 ENCOUNTER — Inpatient Hospital Stay (HOSPITAL_BASED_OUTPATIENT_CLINIC_OR_DEPARTMENT_OTHER): Payer: Medicare Other | Admitting: Internal Medicine

## 2021-02-08 ENCOUNTER — Other Ambulatory Visit: Payer: Self-pay

## 2021-02-08 VITALS — BP 147/84 | HR 75 | Temp 97.8°F | Resp 21 | Ht 72.0 in | Wt 194.8 lb

## 2021-02-08 DIAGNOSIS — Z79899 Other long term (current) drug therapy: Secondary | ICD-10-CM | POA: Diagnosis not present

## 2021-02-08 DIAGNOSIS — C3490 Malignant neoplasm of unspecified part of unspecified bronchus or lung: Secondary | ICD-10-CM

## 2021-02-08 DIAGNOSIS — C3491 Malignant neoplasm of unspecified part of right bronchus or lung: Secondary | ICD-10-CM

## 2021-02-08 DIAGNOSIS — R0609 Other forms of dyspnea: Secondary | ICD-10-CM | POA: Insufficient documentation

## 2021-02-08 DIAGNOSIS — C3431 Malignant neoplasm of lower lobe, right bronchus or lung: Secondary | ICD-10-CM | POA: Diagnosis present

## 2021-02-08 DIAGNOSIS — R5383 Other fatigue: Secondary | ICD-10-CM | POA: Diagnosis not present

## 2021-02-08 DIAGNOSIS — Z5112 Encounter for antineoplastic immunotherapy: Secondary | ICD-10-CM | POA: Diagnosis present

## 2021-02-08 DIAGNOSIS — Z923 Personal history of irradiation: Secondary | ICD-10-CM | POA: Insufficient documentation

## 2021-02-08 DIAGNOSIS — E876 Hypokalemia: Secondary | ICD-10-CM | POA: Diagnosis not present

## 2021-02-08 LAB — CMP (CANCER CENTER ONLY)
ALT: 10 U/L (ref 0–44)
AST: 12 U/L — ABNORMAL LOW (ref 15–41)
Albumin: 3.4 g/dL — ABNORMAL LOW (ref 3.5–5.0)
Alkaline Phosphatase: 73 U/L (ref 38–126)
Anion gap: 8 (ref 5–15)
BUN: 9 mg/dL (ref 8–23)
CO2: 25 mmol/L (ref 22–32)
Calcium: 8.8 mg/dL — ABNORMAL LOW (ref 8.9–10.3)
Chloride: 102 mmol/L (ref 98–111)
Creatinine: 0.78 mg/dL (ref 0.61–1.24)
GFR, Estimated: >60 mL/min (ref >60)
Glucose, Bld: 137 mg/dL — ABNORMAL HIGH (ref 70–99)
Potassium: 3.5 mmol/L (ref 3.5–5.1)
Sodium: 135 mmol/L (ref 135–145)
Total Bilirubin: 0.5 mg/dL (ref 0.3–1.2)
Total Protein: 8.1 g/dL (ref 6.5–8.1)

## 2021-02-08 LAB — CBC WITH DIFFERENTIAL (CANCER CENTER ONLY)
Abs Immature Granulocytes: 0.02 10*3/uL (ref 0.00–0.07)
Basophils Absolute: 0 10*3/uL (ref 0.0–0.1)
Basophils Relative: 0 %
Eosinophils Absolute: 0.1 10*3/uL (ref 0.0–0.5)
Eosinophils Relative: 1 %
HCT: 37.1 % — ABNORMAL LOW (ref 39.0–52.0)
Hemoglobin: 12.2 g/dL — ABNORMAL LOW (ref 13.0–17.0)
Immature Granulocytes: 0 %
Lymphocytes Relative: 19 %
Lymphs Abs: 1.5 10*3/uL (ref 0.7–4.0)
MCH: 26.8 pg (ref 26.0–34.0)
MCHC: 32.9 g/dL (ref 30.0–36.0)
MCV: 81.4 fL (ref 80.0–100.0)
Monocytes Absolute: 0.9 10*3/uL (ref 0.1–1.0)
Monocytes Relative: 11 %
Neutro Abs: 5.5 10*3/uL (ref 1.7–7.7)
Neutrophils Relative %: 69 %
Platelet Count: 260 10*3/uL (ref 150–400)
RBC: 4.56 MIL/uL (ref 4.22–5.81)
RDW: 15.1 % (ref 11.5–15.5)
WBC Count: 8.1 10*3/uL (ref 4.0–10.5)
nRBC: 0 % (ref 0.0–0.2)

## 2021-02-08 LAB — TSH: TSH: 1.108 u[IU]/mL (ref 0.320–4.118)

## 2021-02-08 MED ORDER — SODIUM CHLORIDE 0.9 % IV SOLN
1500.0000 mg | Freq: Once | INTRAVENOUS | Status: AC
  Administered 2021-02-08: 1500 mg via INTRAVENOUS
  Filled 2021-02-08: qty 30

## 2021-02-08 MED ORDER — SODIUM CHLORIDE 0.9 % IV SOLN: Freq: Once | INTRAVENOUS | Status: AC

## 2021-02-08 NOTE — Patient Instructions (Signed)
[facility]   Discharge Instructions: Thank you for choosing [facility] to provide your oncology and hematology care.   If you have a lab appointment with the Cancer Center, please go directly to the Cancer Center and check in at the registration area.   Wear comfortable clothing and clothing appropriate for easy access to any Portacath or PICC line.   We strive to give you quality time with your provider. You may need to reschedule your appointment if you arrive late (15 or more minutes).  Arriving late affects you and other patients whose appointments are after yours.  Also, if you miss three or more appointments without notifying the office, you may be dismissed from the clinic at the provider's discretion.      For prescription refill requests, have your pharmacy contact our office and allow 72 hours for refills to be completed.    Today you received the following chemotherapy and/or immunotherapy agents: Durvalumab (Imfinzi).      To help prevent nausea and vomiting after your treatment, we encourage you to take your nausea medication as directed.  BELOW ARE SYMPTOMS THAT SHOULD BE REPORTED IMMEDIATELY: *FEVER GREATER THAN 100.4 F (38 C) OR HIGHER *CHILLS OR SWEATING *NAUSEA AND VOMITING THAT IS NOT CONTROLLED WITH YOUR NAUSEA MEDICATION *UNUSUAL SHORTNESS OF BREATH *UNUSUAL BRUISING OR BLEEDING *URINARY PROBLEMS (pain or burning when urinating, or frequent urination) *BOWEL PROBLEMS (unusual diarrhea, constipation, pain near the anus) TENDERNESS IN MOUTH AND THROAT WITH OR WITHOUT PRESENCE OF ULCERS (sore throat, sores in mouth, or a toothache) UNUSUAL RASH, SWELLING OR PAIN  UNUSUAL VAGINAL DISCHARGE OR ITCHING   Items with * indicate a potential emergency and should be followed up as soon as possible or go to the Emergency Department if any problems should occur.  Please show the CHEMOTHERAPY ALERT CARD or IMMUNOTHERAPY ALERT CARD  at check-in to the Emergency Department and triage nurse.  Should you have questions after your visit or need to cancel or reschedule your appointment, please contact [facility]  Dept: [phone]  and follow the prompts.  Office hours are 8:00 a.m. to 4:30 p.m. Monday - Friday. Please note that voicemails left after 4:00 p.m. may not be returned until the following business day.  We are closed weekends and major holidays. You have access to a nurse at all times for urgent questions. Please call the main number to the clinic Dept: [phone] and follow the prompts.   For any non-urgent questions, you may also contact your provider using MyChart. We now offer e-Visits for anyone 18 and older to request care online for non-urgent symptoms. For details visit [email or web address].   Also download the MyChart app! Go to the app store, search "MyChart", open the app, select , and log in with your MyChart username and password.  Due to Covid, a mask is required upon entering the hospital/clinic. If you do not have a mask, one will be given to you upon arrival. For doctor visits, patients may have 1 support person aged 18 or older with them. For treatment visits, patients cannot have anyone with them due to current Covid guidelines and our immunocompromised population.   

## 2021-02-08 NOTE — Progress Notes (Signed)
[place] Telephone:([phone]) [phone]   Fax:(336) [phone]  OFFICE PROGRESS NOTE  [name], MD [address] [place] [address]  DIAGNOSIS: Relapsed small cell lung cancer initially diagnosed as limited stage (T3, N2, M0) small cell lung cancer presented with pulmonary nodules and pleural-based nodules in the right lung in addition to mediastinal lymphadenopathy diagnosed in August 2021.  The patient was previously treated without biopsy to a pleural-based nodule in the right lower lobe with SBRT in March 2021 but had evidence for disease recurrence and progression.  He has disease relapse in June 2022.  PRIOR THERAPY:  1) SBRT to right lower lobe pulmonary nodule in March 2021 under the care of Dr. Sondra Come. 2) Systemic chemotherapy with carboplatin for AUC of 5 on day 1 and etoposide 100 mg/M2 on days 1, 2 and 3 with Neulasta support every 3 weeks.  First cycle 10/07/2019.  Status post 4 cycles.  Last dose of the chemotherapy was given on December 09, 2019.  CURRENT THERAPY: Second line systemic chemotherapy with carboplatin for AUC of 5 on day 1, etoposide 100 Mg/M2 on days 1, 2 and 3 with Cosela before the chemotherapy in addition to Imfinzi 1500 Mg IV on day 1 every 3 weeks.  First dose July 27, 2020.  Status post 8 cycles.  Cosela was discontinued secondary to phlebitis.  Starting from cycle #5 the patient will be on maintenance treatment with Imfinzi 1500 Mg IV every 4 weeks.  INTERVAL HISTORY: Jonathon [name] 71 y.o. male returns to the clinic today for follow-up visit accompanied by his wife.  The patient is feeling fine today with no concerning complaints except for shortness of breath with exertion when he goes upstairs.  He has intermittent pain on the right side of the chest and he does not take any medication for it and resolving spontaneously.  He has no cough or hemoptysis.  He denied having any current nausea, vomiting, diarrhea or  constipation.  He has no headache or visual changes.  The patient is here today for evaluation before restarting cycle #9 of his treatment.   MEDICAL HISTORY: Past Medical History:  Diagnosis Date   Cancer ([place])    CHF (congestive heart failure) (HCC)    Chronic cough    COPD, severe (HCC)    PT DENIES REGULAR DAILY INHALER ANY PRN   Coronary artery disease    CVA (cerebral vascular accident) ([place]) 02/22/2011   LEFT BRAIN STEM PONTINE HEMORRHAGE--  RIGHT SIDED WEAKNESS   Diabetes mellitus without complication (HCC)    type 2   Dyspnea    GERD (gastroesophageal reflux disease)    Harsh voice quality    PT STATES NORMAL FOR HIM   High cholesterol    History of CHF (congestive heart failure) MONITORED BY DR Coletta Memos   DIASTOLIC   History of radiation therapy 03/13/19-03/20/19   SBRT Right Lung; Dr. Gery Pray   History of radiation therapy 02/17/20-02/28/20   Whole brain radiation; Dr. Gery Pray   History of radiation therapy 05/25/2020   05/18/2020-05/25/2020  SBRT Right Lung; Dr. Gery Pray   Hydrocele, left    Hypertension    NSTEMI (non-ST elevated myocardial infarction) ([place]) 12/2015   Pre-operative clearance    GIVEN BY DR Coletta Memos   Short of breath on exertion    Smoker    Weakness of right side of body    S/P CVA   FEB 2013  ALLERGIES:  is allergic to atorvastatin.  MEDICATIONS:  Current Outpatient Medications  Medication Sig Dispense Refill   albuterol (PROVENTIL) (2.5 MG/3ML) 0.083% nebulizer solution Take 2.5 mg by nebulization every 4 (four) hours as needed for wheezing or shortness of breath.     Albuterol Sulfate (PROAIR RESPICLICK) 465 (90 BASE) MCG/ACT AEPB Inhale 2 puffs into the lungs 4 (four) times daily as needed. 1 each 5   amLODipine (NORVASC) 10 MG tablet TAKE 1 TABLET BY MOUTH ONCE A DAY 90 tablet 3   atorvastatin (LIPITOR) 20 MG tablet Take one tablet (20 mg dose) by mouth daily. 30 tablet 5   bisoprolol (ZEBETA) 5 MG tablet Take one tablet  (5 mg dose) by mouth daily. 90 tablet 3   cloNIDine (CATAPRES) 0.3 MG tablet TAKE 1 TABLET BY MOUTH TWICE DAILY 180 tablet 3   Cyanocobalamin (VITAMIN B 12 PO) Take 1 tablet by mouth every morning.      cyclobenzaprine (FLEXERIL) 5 MG tablet TAKE 1 TABLET BY MOUTH AT BEDTIME AS NEEDED FOR MUSCLE SPASMS 30 tablet 3   dabigatran (PRADAXA) 150 MG CAPS capsule Take 150 mg by mouth 2 (two) times daily.     DULoxetine (CYMBALTA) 30 MG capsule Take one capsule (30 mg dose) by mouth daily. 90 capsule 3   gabapentin (NEURONTIN) 400 MG capsule TAKE 2 CAPSULES BY MOUTH THREE TIMES A DAY 180 capsule 7   losartan (COZAAR) 100 MG tablet TAKE 1 TABLET BY MOUTH DAILY. 90 tablet 3   metFORMIN (GLUCOPHAGE) 500 MG tablet TAKE 1 TABLET BY MOUTH ONCE A DAY WITH BREAKFAST 90 tablet 3   nitroGLYCERIN (NITROSTAT) 0.4 MG SL tablet PLACE 1 TABLET UNDER THE TONGUE EVERY 5  MINUTES AS NEEDED AS DIRECTED. 25 tablet 5   nortriptyline (PAMELOR) 25 MG capsule TAKE 1 CAPSULE BY MOUTH EVERY NIGHT AT BEDTIME 90 capsule 0   pantoprazole (PROTONIX) 20 MG tablet TAKE 1 TABLET BY MOUTH DAILY 90 tablet 2   potassium chloride SA (KLOR-CON M) 20 MEQ tablet Take 1 tablet by mouth once daily. 10 tablet 0   prochlorperazine (COMPAZINE) 10 MG tablet Take 1 tablet (10 mg total) by mouth every 6 (six) hours as needed for nausea or vomiting. 30 tablet 0   SYMBICORT 160-4.5 MCG/ACT inhaler INHALE 2 PUFFS INTO THE LUNGS TWICE DAILY 10.2 g 11   SYMBICORT 160-4.5 MCG/ACT inhaler INHALE 2 PUFFS INTO THE LUNGS TWICE DAILY 10.2 g 11   tamsulosin (FLOMAX) 0.4 MG CAPS capsule Take one capsule (0.4 mg dose) by mouth daily. 90 capsule 3   temazepam (RESTORIL) 15 MG capsule Take by mouth.     Tiotropium Bromide Monohydrate (SPIRIVA RESPIMAT) 2.5 MCG/ACT AERS Inhale two puffs into the lungs as needed. 4 g 11   No current facility-administered medications for this visit.    SURGICAL HISTORY:  Past Surgical History:  Procedure Laterality Date    ABDOMINAL ANGIOGRAM  09/04/2012   Procedure: ABDOMINAL ANGIOGRAM;  Surgeon: Laverda Page, MD;  Location: [facility] CATH LAB;  Service: Cardiovascular;;   CARDIAC CATHETERIZATION  05-02-2011  DR Johnsie Cancel   MILD NONOBSTRUCTIVE CAD/ LAD, OM , AND D1/ EF 60^   CARDIAC CATHETERIZATION  AUG 2000   NON-OBSTRUTIVE CAD/ EF 68%/ 25% PROXIMAL LAD/ 20% MID CIRCUMFLEX   CARDIAC CATHETERIZATION [name] 12/23/2015   Procedure: Left Heart Cath and Coronary Angiography;  Surgeon: Adrian Prows, MD;  Location: [facility] CV LAB;  Service: Cardiovascular;  Laterality: [name];   CARDIAC CATHETERIZATION [name] 12/24/2015  Procedure: Coronary Stent Intervention;  Surgeon: Adrian Prows, MD;  Location: [place] CV LAB;  Service: Cardiovascular;  Laterality: [name];   CORONARY ANGIOPLASTY     CORONARY STENT PLACEMENT  12/24/2015    PTCA and stenting of the distal RCA    HYDROCELE EXCISION Left 04/24/2012   Procedure: HYDROCELECTOMY ADULT;  Surgeon: Fredricka Bonine, MD;  Location[facility];  Service: Urology;  Laterality: Left;  90 mins req for this case      [place]   LEFT AND RIGHT HEART CATHETERIZATION WITH CORONARY ANGIOGRAM [name] 09/04/2012   Procedure: LEFT AND RIGHT HEART CATHETERIZATION WITH CORONARY ANGIOGRAM;  Surgeon: Laverda Page, MD;  Location: [facility] CATH LAB;  Service: Cardiovascular;  Laterality: [name];   LEFT HEART CATHETERIZATION WITH CORONARY ANGIOGRAM [name] 05/02/2011   Procedure: LEFT HEART CATHETERIZATION WITH CORONARY ANGIOGRAM;  Surgeon: Josue Hector, MD;  Location: [facility] CATH LAB;  Service: Cardiovascular;  Laterality: [name];   THORACOTOMY/LOBECTOMY Right 03-06-2008   AND STAPLING OF BLEBS FOR SPONTANOUS PNEUMOTHORAX   TRANSTHORACIC ECHOCARDIOGRAM  02-17-2011   MODERATE LVH/ LVSF NORMAL/ EF 28-78%/ GRADE I DIASTOLIC DYSFUNCTION    REVIEW OF SYSTEMS:  A comprehensive review of systems was negative except for: Constitutional: positive for fatigue Respiratory: positive for  dyspnea on exertion   PHYSICAL EXAMINATION: General appearance: alert, cooperative, fatigued, and no distress Head: Normocephalic, without obvious abnormality, atraumatic Neck: no adenopathy, no JVD, supple, symmetrical, trachea midline, and thyroid not enlarged, symmetric, no tenderness/mass/nodules Lymph nodes: Cervical, supraclavicular, and axillary nodes normal. Resp: clear to auscultation bilaterally Back: symmetric, no curvature. ROM normal. No CVA tenderness. Cardio: regular rate and rhythm, S1, S2 normal, no murmur, click, rub or gallop GI: soft, non-tender; bowel sounds normal; no masses,  no organomegaly Extremities: extremities normal, atraumatic, no cyanosis or edema  ECOG PERFORMANCE STATUS: 1 - Symptomatic but completely ambulatory  Blood pressure (!) 147/84, pulse 75, temperature 97.8 F (36.6 C), temperature source Tympanic, resp. rate (!) 21, height 6' (1.829 m), weight 194 lb 12.8 oz (88.4 kg), SpO2 95 %.  LABORATORY DATA: Lab Results  Component Value Date   WBC 8.1 02/08/2021   HGB 12.2 (L) 02/08/2021   HCT 37.1 (L) 02/08/2021   MCV 81.4 02/08/2021   PLT 260 02/08/2021      Chemistry      Component Value Date/Time   NA 134 (L) 01/12/2021 1013   K 3.4 (L) 01/12/2021 1013   CL 101 01/12/2021 1013   CO2 28 01/12/2021 1013   BUN 8 01/12/2021 1013   CREATININE 0.87 01/12/2021 1013      Component Value Date/Time   CALCIUM 8.9 01/12/2021 1013   ALKPHOS 76 01/12/2021 1013   AST 8 (L) 01/12/2021 1013   ALT 8 01/12/2021 1013   BILITOT 0.3 01/12/2021 1013       RADIOGRAPHIC STUDIES: No results found.   ASSESSMENT AND PLAN: This is a very pleasant 71 years old white male recently diagnosed with limited stage small cell lung cancer (T3, N2, M0) presented with pleural-based pulmonary nodules in the right lung in addition to mediastinal lymphadenopathy in August 2021.  The patient was initially diagnosed with a pleural-based nodule status post SBRT in March  2021. He completed systemic chemotherapy with carboplatin for AUC of 5 from day 1 and to etoposide 100 mg/M2 on days 1, 2 and 3 with Neulasta support every 3 weeks.  Status post 4 cycles. The patient tolerated his treatment well except for  fatigue. He also underwent palliative radiotherapy to enlarging right lung nodule under the care of Dr. Sondra Come. The patient is currently on observation.  He had repeat CT scan of the chest performed recently. I personally and independently reviewed the scan images and discussed the result and showed the images to the patient and his wife today. His scan showed new and enlarging bilateral pulmonary nodules and the enlarging right hilar lymph nodes consistent with metastatic disease. The patient is currently undergoing palliative systemic chemotherapy with carboplatin for AUC of 5 on day 1, etoposide 100 Mg/M2 on days 1, 2 and 3 with Cosela before the chemotherapy as well as Imfinzi 1500 Mg IV on day 1 every 3 weeks with the chemotherapy followed by maintenance every 4 weeks.  Status post 7 cycles.  Starting from cycle #5 the patient will be on maintenance treatment with single agent Imfinzi 1500 Mg IV every 4 weeks.  Cosela was discontinued after cycle #1 secondary to phlebitis and the patient refused to continue with the treatment. He has been tolerating this treatment well with no concerning adverse effects. I recommended for the patient to proceed with cycle #9 today as planned. I will see him back for follow-up visit in 4 weeks for evaluation with repeat CT scan of the chest, abdomen and pelvis for restaging of his disease and to have close monitoring of the slowly enlarging right hilar lymph node. For the hypokalemia, the patient will continue his current treatment with potassium chloride supplements. He was advised to call immediately if he has any concerning symptoms in the interval.  The patient voices understanding of current disease status and treatment  options and is in agreement with the current care plan.  All questions were answered. The patient knows to call the clinic with any problems, questions or concerns. We can certainly see the patient much sooner if necessary.  Disclaimer: This note was dictated with voice recognition software. Similar sounding words can inadvertently be transcribed and may not be corrected upon review.

## 2021-02-09 ENCOUNTER — Other Ambulatory Visit (HOSPITAL_COMMUNITY): Payer: Self-pay

## 2021-02-15 ENCOUNTER — Other Ambulatory Visit (HOSPITAL_COMMUNITY): Payer: Self-pay

## 2021-02-16 ENCOUNTER — Other Ambulatory Visit: Payer: Self-pay

## 2021-02-16 ENCOUNTER — Other Ambulatory Visit (HOSPITAL_COMMUNITY): Payer: Self-pay

## 2021-02-16 MED ORDER — AMLODIPINE BESYLATE 10 MG PO TABS
10.0000 mg | ORAL_TABLET | Freq: Every day | ORAL | 3 refills | Status: AC
  Filled 2021-02-16: qty 90, 90d supply, fill #0

## 2021-02-17 ENCOUNTER — Other Ambulatory Visit: Payer: Self-pay

## 2021-02-17 ENCOUNTER — Encounter: Payer: Self-pay | Admitting: Cardiology

## 2021-02-17 ENCOUNTER — Ambulatory Visit: Payer: Medicare Other | Admitting: Cardiology

## 2021-02-17 VITALS — BP 138/70 | HR 74 | Temp 98.1°F | Resp 16 | Ht 72.0 in | Wt 181.2 lb

## 2021-02-17 DIAGNOSIS — C3490 Malignant neoplasm of unspecified part of unspecified bronchus or lung: Secondary | ICD-10-CM

## 2021-02-17 DIAGNOSIS — I25118 Atherosclerotic heart disease of native coronary artery with other forms of angina pectoris: Secondary | ICD-10-CM

## 2021-02-17 DIAGNOSIS — I10 Essential (primary) hypertension: Secondary | ICD-10-CM

## 2021-02-17 NOTE — Progress Notes (Signed)
Primary Physician/Referring:  [name], MD  Patient ID: Jonathon [name], male    DOB: [birth year], 71 y.o.   MRN: [id number]  Chief Complaint  Patient presents with   Coronary artery disease involving native coronary artery of    3 month   Follow-up    3 months   HPI:    Jonathon [name]  is a 71 y.o.  [name] male patient with history of hypertension, hyperlipidemia, remote stroke without restrictive defect, tobacco cessation in September 2018, CAD with PCI in December 2017 to RCA. He also has history of emphysema, pulmonary nodules and right pneumothorax in 2010 due to emphysematous bleb while he was in [place] and has history of right upper lobe lobectomy for lung cancer.  He has had recurrence of cancer in the right lower lobe and finished radiation therapy.  Unfortunately he has had a third recurrence of metastatic lung cancer and has now been recommended palliative chemotherapy and immunotherapy.  Patient presents for 72-month follow-up.  Last office visit no changes were made as patient was relatively stable from a cardiovascular standpoint.  He has used sublingual nitroglycerin occasionally last use, 2 tablets 2 days ago.  His wife is present.   Past Medical History:  Diagnosis Date   Cancer ([place])    CHF (congestive heart failure) (HCC)    Chronic cough    COPD, severe (HCC)    PT DENIES REGULAR DAILY INHALER ANY PRN   Coronary artery disease    CVA (cerebral vascular accident) ([place]) 02/22/2011   LEFT BRAIN STEM PONTINE HEMORRHAGE--  RIGHT SIDED WEAKNESS   Diabetes mellitus without complication (HCC)    type 2   Dyspnea    GERD (gastroesophageal reflux disease)    Harsh voice quality    PT STATES NORMAL FOR HIM   High cholesterol    History of CHF (congestive heart failure) MONITORED BY DR Coletta Memos   DIASTOLIC   History of radiation therapy 03/13/19-03/20/19   SBRT Right Lung; Dr. Gery Pray   History of radiation therapy 02/17/20-02/28/20   Whole  brain radiation; Dr. Gery Pray   History of radiation therapy 05/25/2020   05/18/2020-05/25/2020  SBRT Right Lung; Dr. Gery Pray   Hydrocele, left    Hypertension    NSTEMI (non-ST elevated myocardial infarction) ([place]) 12/2015   Pre-operative clearance    GIVEN BY DR Coletta Memos   Short of breath on exertion    Smoker    Weakness of right side of body    S/P CVA   FEB 2013   Past Surgical History:  Procedure Laterality Date   ABDOMINAL ANGIOGRAM  09/04/2012   Procedure: ABDOMINAL ANGIOGRAM;  Surgeon: Laverda Page, MD;  Location: [facility] CATH LAB;  Service: Cardiovascular;;   CARDIAC CATHETERIZATION  05-02-2011  DR Johnsie Cancel   MILD NONOBSTRUCTIVE CAD/ LAD, OM , AND D1/ EF 60^   CARDIAC CATHETERIZATION  AUG 2000   NON-OBSTRUTIVE CAD/ EF 68%/ 25% PROXIMAL LAD/ 20% MID CIRCUMFLEX   CARDIAC CATHETERIZATION [name] 12/23/2015   Procedure: Left Heart Cath and Coronary Angiography;  Surgeon: Adrian Prows, MD;  Location: [facility] CV LAB;  Service: Cardiovascular;  Laterality: [name];   CARDIAC CATHETERIZATION [name] 12/24/2015   Procedure: Coronary Stent Intervention;  Surgeon: Adrian Prows, MD;  Location: [facility] CV LAB;  Service: Cardiovascular;  Laterality: [name];   CORONARY ANGIOPLASTY     CORONARY STENT PLACEMENT  12/24/2015    PTCA and stenting of the distal RCA    HYDROCELE EXCISION  Left 04/24/2012   Procedure: HYDROCELECTOMY ADULT;  Surgeon: Fredricka Bonine, MD;  Location[facility];  Service: Urology;  Laterality: Left;  90 mins req for this case      Jonathon [name]   LEFT AND RIGHT HEART CATHETERIZATION WITH CORONARY ANGIOGRAM [name] 09/04/2012   Procedure: LEFT AND RIGHT HEART CATHETERIZATION WITH CORONARY ANGIOGRAM;  Surgeon: Laverda Page, MD;  Location: [facility] CATH LAB;  Service: Cardiovascular;  Laterality: [name];   LEFT HEART CATHETERIZATION WITH CORONARY ANGIOGRAM [name] 05/02/2011   Procedure: LEFT HEART CATHETERIZATION WITH CORONARY ANGIOGRAM;  Surgeon: Josue Hector, MD;  Location: [facility] CATH LAB;  Service: Cardiovascular;  Laterality: [name];   THORACOTOMY/LOBECTOMY Right 03-06-2008   AND STAPLING OF BLEBS FOR SPONTANOUS PNEUMOTHORAX   TRANSTHORACIC ECHOCARDIOGRAM  02-17-2011   MODERATE LVH/ LVSF NORMAL/ EF 75-64%/ GRADE I DIASTOLIC DYSFUNCTION   Family History  Problem Relation Age of Onset   Hypertension Mother    Stroke Mother 21   Lung disease Father 32       died of "colapsed lung" per patient   Asthma Father    Diabetes Sister    Colon cancer Neg Hx    Stomach cancer Neg Hx    Social History   Tobacco Use   Smoking status: Some Days    Packs/day: 0.25    Years: 53.00    Pack years: 13.25    Types: Cigarettes    Last attempt to quit: 2019    Years since quitting: 4.0   Smokeless tobacco: Never   Tobacco comments:    10/17/2020 maybe 1 cigarette with a cup of coffee  Substance Use Topics   Alcohol use: Not Currently    Alcohol/week: 0.0 standard drinks    Comment: 02/10/2016 "nothing in the last 10 years"   Marital status: Married   ROS  Review of Systems  Constitutional: Positive for malaise/fatigue and weight loss.  Cardiovascular:  Positive for chest pain. Negative for leg swelling and syncope.  Respiratory:  Positive for cough, shortness of breath and wheezing.   Hematologic/Lymphatic: Does not bruise/bleed easily.  Gastrointestinal:  Negative for melena.   Objective   Vitals with BMI 02/17/2021 02/08/2021 01/14/2021  Height 6\' 0"  6\' 0"  6\' 0"   Weight 181 lbs 3 oz 194 lbs 13 oz 192 lbs  BMI 24.57 33.29 51.88  Systolic 416 606 301  Diastolic 70 84 82  Pulse 74 75 69    Blood pressure 138/70, pulse 74, temperature 98.1 F (36.7 C), temperature source Temporal, resp. rate 16, height 6' (1.829 m), weight 181 lb 3.2 oz (82.2 kg), SpO2 93 %. Body mass index is 24.58 kg/m.   Physical Exam Constitutional:      Comments: Well-built, mildly obese in no acute distress.  Cardiovascular:     Rate and Rhythm: Normal rate and  regular rhythm.     Pulses: Normal pulses and intact distal pulses.     Heart sounds: Normal heart sounds. No murmur heard.   No gallop.     Comments: No JVD.  Pulmonary:     Effort: Pulmonary effort is normal.     Breath sounds: Wheezing (diffuse scattered occasional) and rales (left base) present.  Abdominal:     General: Bowel sounds are normal.     Palpations: Abdomen is soft.  Musculoskeletal:     Right lower leg: No edema.     Left lower leg: No edema.    Laboratory examination:   CMP Latest  Ref Rng & Units 02/08/2021 01/12/2021 12/14/2020  Glucose 70 - 99 mg/dL 137(H) 139(H) 149(H)  BUN 8 - 23 mg/dL 9 8 12   Creatinine 0.61 - 1.24 mg/dL 0.78 0.87 0.94  Sodium 135 - 145 mmol/L 135 134(L) 134(L)  Potassium 3.5 - 5.1 mmol/L 3.5 3.4(L) 3.2(L)  Chloride 98 - 111 mmol/L 102 101 98  CO2 22 - 32 mmol/L 25 28 26   Calcium 8.9 - 10.3 mg/dL 8.8(L) 8.9 9.0  Total Protein 6.5 - 8.1 g/dL 8.1 7.4 7.4  Total Bilirubin 0.3 - 1.2 mg/dL 0.5 0.3 0.4  Alkaline Phos 38 - 126 U/L 73 76 78  AST 15 - 41 U/L 12(L) 8(L) 10(L)  ALT 0 - 44 U/L 10 8 11    CBC Latest Ref Rng & Units 02/08/2021 01/12/2021 12/14/2020  WBC 4.0 - 10.5 K/uL 8.1 7.4 7.4  Hemoglobin 13.0 - 17.0 g/dL 12.2(L) 12.1(L) 12.2(L)  Hematocrit 39.0 - 52.0 % 37.1(L) 36.8(L) 36.4(L)  Platelets 150 - 400 K/uL 260 259 235   TSH Recent Labs    12/14/20 0958 01/12/21 1012 02/08/21 0941  TSH 0.970 1.835 1.108     External Labs:  11/27/2019: A1c 7.0%. Total cholesterol 135, triglycerides 103, HDL 62, LDL 54.  Allergies   Allergies  Allergen Reactions   Atorvastatin Other (See Comments)    Severe constipation    Final Medications at End of Visit    Current Meds  Medication Sig   albuterol (PROVENTIL) (2.5 MG/3ML) 0.083% nebulizer solution Take 2.5 mg by nebulization every 4 (four) hours as needed for wheezing or shortness of breath.   Albuterol Sulfate (PROAIR RESPICLICK) 174 (90 BASE) MCG/ACT AEPB Inhale 2 puffs into  the lungs 4 (four) times daily as needed.   amLODipine (NORVASC) 10 MG tablet TAKE 1 TABLET BY MOUTH ONCE A DAY   atorvastatin (LIPITOR) 20 MG tablet Take one tablet (20 mg dose) by mouth daily.   bisoprolol (ZEBETA) 5 MG tablet Take one tablet (5 mg dose) by mouth daily.   cloNIDine (CATAPRES) 0.3 MG tablet TAKE 1 TABLET BY MOUTH TWICE DAILY   Cyanocobalamin (VITAMIN B 12 PO) Take 1 tablet by mouth every morning.    cyclobenzaprine (FLEXERIL) 5 MG tablet TAKE 1 TABLET BY MOUTH AT BEDTIME AS NEEDED FOR MUSCLE SPASMS   dabigatran (PRADAXA) 150 MG CAPS capsule Take 150 mg by mouth 2 (two) times daily.   DULoxetine (CYMBALTA) 30 MG capsule Take one capsule (30 mg dose) by mouth daily.   gabapentin (NEURONTIN) 400 MG capsule TAKE 2 CAPSULES BY MOUTH THREE TIMES A DAY   losartan (COZAAR) 100 MG tablet TAKE 1 TABLET BY MOUTH DAILY.   metFORMIN (GLUCOPHAGE) 500 MG tablet TAKE 1 TABLET BY MOUTH ONCE A DAY WITH BREAKFAST   nitroGLYCERIN (NITROSTAT) 0.4 MG SL tablet PLACE 1 TABLET UNDER THE TONGUE EVERY 5  MINUTES AS NEEDED AS DIRECTED.   nortriptyline (PAMELOR) 25 MG capsule TAKE 1 CAPSULE BY MOUTH EVERY NIGHT AT BEDTIME   pantoprazole (PROTONIX) 20 MG tablet TAKE 1 TABLET BY MOUTH DAILY   potassium chloride SA (KLOR-CON M) 20 MEQ tablet Take 1 tablet by mouth once daily.   prochlorperazine (COMPAZINE) 10 MG tablet Take 1 tablet (10 mg total) by mouth every 6 (six) hours as needed for nausea or vomiting.   SYMBICORT 160-4.5 MCG/ACT inhaler INHALE 2 PUFFS INTO THE LUNGS TWICE DAILY   SYMBICORT 160-4.5 MCG/ACT inhaler INHALE 2 PUFFS INTO THE LUNGS TWICE DAILY   tamsulosin (FLOMAX) 0.4 MG CAPS  capsule Take one capsule (0.4 mg dose) by mouth daily.   temazepam (RESTORIL) 15 MG capsule Take by mouth.   Tiotropium Bromide Monohydrate (SPIRIVA RESPIMAT) 2.5 MCG/ACT AERS Inhale two puffs into the lungs as needed.   Radiology:   MR brain 01/04/2021: No evidence of intracranial metastatic disease. Stable  chronic findings detailed above.  CT chest/abdomen/pelvis 01/07/2021: 1. Slight interval progression of right hilar lymph node. 2. Similar appearance of volume loss medial left upper lobe with nodular component today measuring 1.9 cm today, stable when measuring on the prior study in a similar fashion. 3. Cholelithiasis. 4. Small to moderate hiatal hernia. 5. Small right groin hernia contains only fat. 6. Aortic Atherosclerosis (ICD10-I70.0) and Emphysema (ICD10-J43.9).  Cardiac Studies:   Coronary angiogram 12/24/2015: PTCA and stenting of the distal RCA with 4.0 x 12 mm onyx DES. Normal LVEF. Mild disease in other vessels.  Lexiscan myoview stress test 06/19/2017:  1. Lexiscan stress test was performed. Exercise capacity was not assessed. Stress symptoms included shortness of breath, lightheadedness, and chest pressure. Blood pressure was 126/78 mmHg. Stress EKG is non diagnostic for ischemia as it is a pharmacologic stress. In addition, it showed normal sinus rhythm, normal stress conduction, no stress arrhythmias and normal stress repolarization.   2. The overall quality of the study is good. There is no evidence of abnormal lung activity. Stress and rest SPECT images demonstrate homogeneous tracer distribution throughout the myocardium. Gated SPECT imaging reveals normal myocardial thickening and wall motion. The left ventricular ejection fraction was calculated as 43%, however visually appears normal. 3. Low to intermediate risk study.  Echocardiogram 08/08/2017:  Left ventricle cavity is normal in size. Mild concentric hypertrophy of the left ventricle. Normal global wall motion. Normal diastolic filling pattern. Calculated EF 52%. Mild to moderate aortic regurgitation. Mild (Grade I) mitral regurgitation. Inadequate tricuspid regurgitation jet to estimate pulmonary artery pressure. Normal right atrial pressure. No significant change compared to previous study on 01/06/2016.   EKG:    EKG 02/17/2021: Normal sinus rhythm at rate of 69 bpm, left axis deviation, left anterior fascicular block.  No evidence of ischemia.  No significant change from 12/02/2019.  Assessment     ICD-10-CM   1. Coronary artery disease of native artery of native heart with stable angina pectoris ([place])  I25.118     2. Essential hypertension, benign  I10 EKG 12-Lead    3. Metastatic lung cancer (metastasis from lung to other site), unspecified laterality ([place])  C34.90       No orders of the defined types were placed in this encounter.  There are no discontinued medications.  Recommendations:   Jonathon [name]  is a 71 y.o. [name] male patient with history of hypertension, hyperlipidemia, remote stroke without restrictive defect, tobacco cessation in September 2018, CAD with PCI in December 2017 to RCA. He also has history of emphysema, pulmonary nodules and right pneumothorax in 2010 due to emphysematous bleb while he was in [place] and has history of right upper lobe lobectomy for lung cancer.  He has had recurrence of cancer in the right lower lobe and finished radiation therapy.  Unfortunately he has now had a third recurrence of metastatic lung cancer and has now been recommended palliative chemotherapy and immunotherapy.  From cardiac standpoint there is no clinical evidence of heart failure, blood pressure is well controlled, is tolerating all his medications well he has had occasional episodes of angina which is easily relieved with sublingual nitroglycerin.  Otherwise no clinical  evidence of heart failure, he is on appropriate medical therapy, really not much for me to offer but he likes to see me back on a frequent basis, an appointment was again made to be seen at 3 months.  After long discussion on his last office visit 3 months ago, I have created ACP documents and also end of life goals discussed with the patient and after discussions he was made DNR.  However he is now back  on full code, he again asked me regarding palliative chemotherapy that he is undergoing and what it means to his life.  He does acknowledge feeling extremely poorly for at least a week every time he goes through chemotherapy/immunotherapy and gradually recuperates but then has to be on therapy again.  I have advised them that he has been scheduled for CT scan of the chest by his oncologist, if there is no improvement or stability in tumor burden, it is left to the patient whether he would chooses to going to palliative care and enjoy the remaining years he has versus going through chemo/immunotherapy and feeling poorly.  He is also started to lose weight and in general has low energy and continued fatigue.  I will forward my note to his oncologist as well.   Adrian Prows, MD, [facility] 02/17/2021, 5:05 PM Office: [phone] Fax: [phone] Pager: [phone]

## 2021-02-22 ENCOUNTER — Other Ambulatory Visit (HOSPITAL_COMMUNITY): Payer: Self-pay

## 2021-02-23 ENCOUNTER — Other Ambulatory Visit (HOSPITAL_COMMUNITY): Payer: Self-pay

## 2021-02-23 MED ORDER — ATORVASTATIN CALCIUM 20 MG PO TABS
ORAL_TABLET | ORAL | 5 refills | Status: AC
  Filled 2021-02-23: qty 30, 30d supply, fill #0
  Filled 2021-03-30: qty 30, 30d supply, fill #1
  Filled 2021-04-27: qty 30, 30d supply, fill #2

## 2021-02-24 ENCOUNTER — Other Ambulatory Visit (HOSPITAL_COMMUNITY): Payer: Self-pay

## 2021-02-24 MED ORDER — CEFDINIR 300 MG PO CAPS
ORAL_CAPSULE | ORAL | 0 refills | Status: DC
  Filled 2021-02-24: qty 14, 7d supply, fill #0

## 2021-02-25 ENCOUNTER — Other Ambulatory Visit (HOSPITAL_COMMUNITY): Payer: Self-pay

## 2021-03-01 ENCOUNTER — Other Ambulatory Visit (HOSPITAL_COMMUNITY): Payer: Self-pay

## 2021-03-04 ENCOUNTER — Other Ambulatory Visit: Payer: Self-pay

## 2021-03-04 ENCOUNTER — Ambulatory Visit (HOSPITAL_COMMUNITY)
Admission: RE | Admit: 2021-03-04 | Discharge: 2021-03-04 | Disposition: A | Discharge status: Home / Self Care | Payer: Medicare Other | Source: Ambulatory Visit | Attending: Internal Medicine | Admitting: Internal Medicine

## 2021-03-04 DIAGNOSIS — C3490 Malignant neoplasm of unspecified part of unspecified bronchus or lung: Secondary | ICD-10-CM | POA: Diagnosis present

## 2021-03-04 MED ORDER — SODIUM CHLORIDE (PF) 0.9 % IJ SOLN
INTRAMUSCULAR | Status: AC
  Filled 2021-03-04: qty 50

## 2021-03-04 MED ORDER — IOHEXOL 300 MG/ML  SOLN
100.0000 mL | Freq: Once | INTRAMUSCULAR | Status: AC | PRN
  Administered 2021-03-04: 100 mL via INTRAVENOUS

## 2021-03-08 ENCOUNTER — Other Ambulatory Visit: Payer: Self-pay

## 2021-03-08 ENCOUNTER — Inpatient Hospital Stay: Payer: Medicare Other

## 2021-03-08 ENCOUNTER — Inpatient Hospital Stay: Payer: Medicare Other | Attending: Internal Medicine

## 2021-03-08 ENCOUNTER — Encounter: Payer: Self-pay | Admitting: Internal Medicine

## 2021-03-08 ENCOUNTER — Inpatient Hospital Stay (HOSPITAL_BASED_OUTPATIENT_CLINIC_OR_DEPARTMENT_OTHER): Payer: Medicare Other | Admitting: Internal Medicine

## 2021-03-08 VITALS — BP 124/77 | HR 87 | Temp 97.4°F | Resp 18 | Ht 72.0 in | Wt 188.2 lb

## 2021-03-08 DIAGNOSIS — R0602 Shortness of breath: Secondary | ICD-10-CM | POA: Diagnosis not present

## 2021-03-08 DIAGNOSIS — K219 Gastro-esophageal reflux disease without esophagitis: Secondary | ICD-10-CM | POA: Insufficient documentation

## 2021-03-08 DIAGNOSIS — I2510 Atherosclerotic heart disease of native coronary artery without angina pectoris: Secondary | ICD-10-CM | POA: Diagnosis not present

## 2021-03-08 DIAGNOSIS — C3431 Malignant neoplasm of lower lobe, right bronchus or lung: Secondary | ICD-10-CM | POA: Diagnosis present

## 2021-03-08 DIAGNOSIS — E7800 Pure hypercholesterolemia, unspecified: Secondary | ICD-10-CM | POA: Insufficient documentation

## 2021-03-08 DIAGNOSIS — Z923 Personal history of irradiation: Secondary | ICD-10-CM | POA: Insufficient documentation

## 2021-03-08 DIAGNOSIS — Z5111 Encounter for antineoplastic chemotherapy: Secondary | ICD-10-CM | POA: Diagnosis not present

## 2021-03-08 DIAGNOSIS — I252 Old myocardial infarction: Secondary | ICD-10-CM | POA: Diagnosis not present

## 2021-03-08 DIAGNOSIS — I509 Heart failure, unspecified: Secondary | ICD-10-CM | POA: Insufficient documentation

## 2021-03-08 DIAGNOSIS — Z8673 Personal history of transient ischemic attack (TIA), and cerebral infarction without residual deficits: Secondary | ICD-10-CM | POA: Diagnosis not present

## 2021-03-08 DIAGNOSIS — C771 Secondary and unspecified malignant neoplasm of intrathoracic lymph nodes: Secondary | ICD-10-CM | POA: Diagnosis not present

## 2021-03-08 DIAGNOSIS — E119 Type 2 diabetes mellitus without complications: Secondary | ICD-10-CM | POA: Diagnosis not present

## 2021-03-08 DIAGNOSIS — Z79899 Other long term (current) drug therapy: Secondary | ICD-10-CM | POA: Diagnosis not present

## 2021-03-08 DIAGNOSIS — R5383 Other fatigue: Secondary | ICD-10-CM | POA: Diagnosis not present

## 2021-03-08 DIAGNOSIS — I10 Essential (primary) hypertension: Secondary | ICD-10-CM | POA: Insufficient documentation

## 2021-03-08 DIAGNOSIS — J449 Chronic obstructive pulmonary disease, unspecified: Secondary | ICD-10-CM | POA: Insufficient documentation

## 2021-03-08 DIAGNOSIS — Z7984 Long term (current) use of oral hypoglycemic drugs: Secondary | ICD-10-CM | POA: Insufficient documentation

## 2021-03-08 DIAGNOSIS — C3491 Malignant neoplasm of unspecified part of right bronchus or lung: Secondary | ICD-10-CM

## 2021-03-08 LAB — CBC WITH DIFFERENTIAL (CANCER CENTER ONLY)
Abs Immature Granulocytes: 0.03 10*3/uL (ref 0.00–0.07)
Basophils Absolute: 0 10*3/uL (ref 0.0–0.1)
Basophils Relative: 0 %
Eosinophils Absolute: 0.1 10*3/uL (ref 0.0–0.5)
Eosinophils Relative: 1 %
HCT: 34.4 % — ABNORMAL LOW (ref 39.0–52.0)
Hemoglobin: 11.1 g/dL — ABNORMAL LOW (ref 13.0–17.0)
Immature Granulocytes: 0 %
Lymphocytes Relative: 16 %
Lymphs Abs: 1.2 10*3/uL (ref 0.7–4.0)
MCH: 25.8 pg — ABNORMAL LOW (ref 26.0–34.0)
MCHC: 32.3 g/dL (ref 30.0–36.0)
MCV: 80 fL (ref 80.0–100.0)
Monocytes Absolute: 0.7 10*3/uL (ref 0.1–1.0)
Monocytes Relative: 9 %
Neutro Abs: 5.8 10*3/uL (ref 1.7–7.7)
Neutrophils Relative %: 74 %
Platelet Count: 307 10*3/uL (ref 150–400)
RBC: 4.3 MIL/uL (ref 4.22–5.81)
RDW: 16.1 % — ABNORMAL HIGH (ref 11.5–15.5)
WBC Count: 7.9 10*3/uL (ref 4.0–10.5)
nRBC: 0 % (ref 0.0–0.2)

## 2021-03-08 LAB — CMP (CANCER CENTER ONLY)
ALT: 8 U/L (ref 0–44)
AST: 10 U/L — ABNORMAL LOW (ref 15–41)
Albumin: 3.4 g/dL — ABNORMAL LOW (ref 3.5–5.0)
Alkaline Phosphatase: 74 U/L (ref 38–126)
Anion gap: 7 (ref 5–15)
BUN: 8 mg/dL (ref 8–23)
CO2: 27 mmol/L (ref 22–32)
Calcium: 8.7 mg/dL — ABNORMAL LOW (ref 8.9–10.3)
Chloride: 99 mmol/L (ref 98–111)
Creatinine: 0.99 mg/dL (ref 0.61–1.24)
GFR, Estimated: >60 mL/min (ref >60)
Glucose, Bld: 176 mg/dL — ABNORMAL HIGH (ref 70–99)
Potassium: 3.5 mmol/L (ref 3.5–5.1)
Sodium: 133 mmol/L — ABNORMAL LOW (ref 135–145)
Total Bilirubin: 0.5 mg/dL (ref 0.3–1.2)
Total Protein: 7.5 g/dL (ref 6.5–8.1)

## 2021-03-08 LAB — TSH: TSH: 1.977 u[IU]/mL (ref 0.320–4.118)

## 2021-03-08 NOTE — Progress Notes (Signed)
DISCONTINUE ON PATHWAY REGIMEN - Small Cell Lung     Cycles 1 through 4: A cycle is every 21 days:     Durvalumab      Carboplatin      Etoposide    Cycles 5 and beyond: A cycle is every 28 days:     Durvalumab   **Always confirm dose/schedule in your pharmacy ordering system**  REASON: Disease Progression PRIOR TREATMENT: [id number]: Durvalumab 1,500 mg D1 + Carboplatin AUC=5 D1 + Etoposide 100 mg/m2 D1-3 q21 Days x 4 Cycles, Followed by Durvalumab 1,500 mg q28 Days Until Progression or Unacceptable Toxicity TREATMENT RESPONSE: Stable Disease (SD)  START OFF PATHWAY REGIMEN - Small Cell Lung   [id number]:Lurbinectedin 3.2 mg/m2 IV D1 q21 Days:   A cycle is every 21 days:     Lurbinectedin   **Always confirm dose/schedule in your pharmacy ordering system**  Patient Characteristics: Relapsed or Progressive Disease, Third Line and Beyond Therapeutic Status: Relapsed or Progressive Disease Line of Therapy: Third Line and Beyond  Intent of Therapy: Non-Curative / Palliative Intent, Discussed with Patient

## 2021-03-08 NOTE — Progress Notes (Signed)
[facility] [phone]   [phone]  OFFICE PROGRESS NOTE  [name], MD [place] [address] [place] [address]  DIAGNOSIS: Relapsed small cell lung cancer initially diagnosed as limited stage (T3, N2, M0) small cell lung cancer presented with pulmonary nodules and pleural-based nodules in the right lung in addition to mediastinal lymphadenopathy diagnosed in August 2021.  The patient was previously treated without biopsy to a pleural-based nodule in the right lower lobe with SBRT in March 2021 but had evidence for disease recurrence and progression.  He has disease relapse in June 2022.  PRIOR THERAPY:  1) SBRT to right lower lobe pulmonary nodule in March 2021 under the care of Dr. Sondra Come. 2) Systemic chemotherapy with carboplatin for AUC of 5 on day 1 and etoposide 100 mg/M2 on days 1, 2 and 3 with Neulasta support every 3 weeks.  First cycle 10/07/2019.  Status post 4 cycles.  Last dose of the chemotherapy was given on December 09, 2019. 3) Second line systemic chemotherapy with carboplatin for AUC of 5 on day 1, etoposide 100 Mg/M2 on days 1, 2 and 3 with Cosela before the chemotherapy in addition to Imfinzi 1500 Mg IV on day 1 every 3 weeks.  First dose July 27, 2020.  Status post 9 cycles.  Cosela was discontinued secondary to phlebitis.  Starting from cycle #5 the patient will be on maintenance treatment with Imfinzi 1500 Mg IV every 4 weeks.  Last dose was given on February 08, 2021 discontinued secondary to disease progression  CURRENT THERAPY: Zepzelca (lurbinectedin) 3.2 Mg/M2 every 3 weeks.  First dose March 15, 2021   INTERVAL HISTORY: Jonathon [name] 71 y.o. male returns to the clinic today for follow-up visit accompanied by his wife.  The patient is feeling fine today with no concerning complaints except for fatigue and shortness of breath at baseline increased with exertion.  He denied having any current chest  pain, cough or hemoptysis.  He denied having any fever or chills.  He has no nausea, vomiting, diarrhea or constipation.  He denied having any skin rash.  He denied having any significant weight loss or night sweats.  He has been tolerating his treatment with maintenance Imfinzi fairly well.  The patient had repeat CT scan of the chest, abdomen pelvis performed recently and he is here for evaluation and discussion of his scan results.   MEDICAL HISTORY: Past Medical History:  Diagnosis Date   Cancer ([facility])    CHF (congestive heart failure) (HCC)    Chronic cough    COPD, severe (HCC)    PT DENIES REGULAR DAILY INHALER ANY PRN   Coronary artery disease    CVA (cerebral vascular accident) ([facility]) 02/22/2011   LEFT BRAIN STEM PONTINE HEMORRHAGE--  RIGHT SIDED WEAKNESS   Diabetes mellitus without complication (HCC)    type 2   Dyspnea    GERD (gastroesophageal reflux disease)    Harsh voice quality    PT STATES NORMAL FOR HIM   High cholesterol    History of CHF (congestive heart failure) MONITORED BY DR Coletta Memos   DIASTOLIC   History of radiation therapy 03/13/19-03/20/19   SBRT Right Lung; Dr. Gery Pray   History of radiation therapy 02/17/20-02/28/20   Whole brain radiation; Dr. Gery Pray   History of radiation therapy 05/25/2020   05/18/2020-05/25/2020  SBRT Right Lung; Dr. Gery Pray   Hydrocele, left    Hypertension    NSTEMI (  non-ST elevated myocardial infarction) ([place]) 12/2015   Pre-operative clearance    GIVEN BY DR Coletta Memos   Short of breath on exertion    Smoker    Weakness of right side of body    S/P CVA   FEB 2013    ALLERGIES:  is allergic to atorvastatin.  MEDICATIONS:  Current Outpatient Medications  Medication Sig Dispense Refill   albuterol (PROVENTIL) (2.5 MG/3ML) 0.083% nebulizer solution Take 2.5 mg by nebulization every 4 (four) hours as needed for wheezing or shortness of breath.     Albuterol Sulfate (PROAIR RESPICLICK) 742 (90 BASE) MCG/ACT AEPB  Inhale 2 puffs into the lungs 4 (four) times daily as needed. 1 each 5   amLODipine (NORVASC) 10 MG tablet TAKE 1 TABLET BY MOUTH ONCE A DAY 90 tablet 3   atorvastatin (LIPITOR) 20 MG tablet Take one tablet by mouth daily. 30 tablet 5   bisoprolol (ZEBETA) 5 MG tablet Take one tablet (5 mg dose) by mouth daily. 90 tablet 3   cefdinir (OMNICEF) 300 MG capsule Take one capsule (300 mg dose) by mouth 2 (two) times daily for 7 days. 14 capsule 0   cloNIDine (CATAPRES) 0.3 MG tablet TAKE 1 TABLET BY MOUTH TWICE DAILY 180 tablet 3   Cyanocobalamin (VITAMIN B 12 PO) Take 1 tablet by mouth every morning.      cyclobenzaprine (FLEXERIL) 5 MG tablet TAKE 1 TABLET BY MOUTH AT BEDTIME AS NEEDED FOR MUSCLE SPASMS 30 tablet 3   dabigatran (PRADAXA) 150 MG CAPS capsule Take 150 mg by mouth 2 (two) times daily.     DULoxetine (CYMBALTA) 30 MG capsule Take one capsule (30 mg dose) by mouth daily. 90 capsule 3   gabapentin (NEURONTIN) 400 MG capsule TAKE 2 CAPSULES BY MOUTH THREE TIMES A DAY 180 capsule 7   losartan (COZAAR) 100 MG tablet TAKE 1 TABLET BY MOUTH DAILY. 90 tablet 3   metFORMIN (GLUCOPHAGE) 500 MG tablet TAKE 1 TABLET BY MOUTH ONCE A DAY WITH BREAKFAST 90 tablet 3   nitroGLYCERIN (NITROSTAT) 0.4 MG SL tablet PLACE 1 TABLET UNDER THE TONGUE EVERY 5  MINUTES AS NEEDED AS DIRECTED. 25 tablet 5   nortriptyline (PAMELOR) 25 MG capsule TAKE 1 CAPSULE BY MOUTH EVERY NIGHT AT BEDTIME 90 capsule 0   pantoprazole (PROTONIX) 20 MG tablet TAKE 1 TABLET BY MOUTH DAILY 90 tablet 2   potassium chloride SA (KLOR-CON M) 20 MEQ tablet Take 1 tablet by mouth once daily. 10 tablet 0   prochlorperazine (COMPAZINE) 10 MG tablet Take 1 tablet (10 mg total) by mouth every 6 (six) hours as needed for nausea or vomiting. 30 tablet 0   SYMBICORT 160-4.5 MCG/ACT inhaler INHALE 2 PUFFS INTO THE LUNGS TWICE DAILY 10.2 g 11   SYMBICORT 160-4.5 MCG/ACT inhaler INHALE 2 PUFFS INTO THE LUNGS TWICE DAILY 10.2 g 11   tamsulosin  (FLOMAX) 0.4 MG CAPS capsule Take one capsule (0.4 mg dose) by mouth daily. 90 capsule 3   temazepam (RESTORIL) 15 MG capsule Take by mouth.     Tiotropium Bromide Monohydrate (SPIRIVA RESPIMAT) 2.5 MCG/ACT AERS Inhale two puffs into the lungs as needed. 4 g 11   No current facility-administered medications for this visit.    SURGICAL HISTORY:  Past Surgical History:  Procedure Laterality Date   ABDOMINAL ANGIOGRAM  09/04/2012   Procedure: ABDOMINAL ANGIOGRAM;  Surgeon: Laverda Page, MD;  Location: [facility] CATH LAB;  Service: Cardiovascular;;   CARDIAC CATHETERIZATION  05-02-2011  DR Johnsie Cancel  MILD NONOBSTRUCTIVE CAD/ LAD, OM , AND D1/ EF 60^   CARDIAC CATHETERIZATION  AUG 2000   NON-OBSTRUTIVE CAD/ EF 68%/ 25% PROXIMAL LAD/ 20% MID CIRCUMFLEX   CARDIAC CATHETERIZATION [name] 12/23/2015   Procedure: Left Heart Cath and Coronary Angiography;  Surgeon: Adrian Prows, MD;  Location: [facility] CV LAB;  Service: Cardiovascular;  Laterality: [name];   CARDIAC CATHETERIZATION [name] 12/24/2015   Procedure: Coronary Stent Intervention;  Surgeon: Adrian Prows, MD;  Location: [facility] CV LAB;  Service: Cardiovascular;  Laterality: [name];   CORONARY ANGIOPLASTY     CORONARY STENT PLACEMENT  12/24/2015    PTCA and stenting of the distal RCA    HYDROCELE EXCISION Left 04/24/2012   Procedure: HYDROCELECTOMY ADULT;  Surgeon: Fredricka Bonine, MD;  Location: [facility];  Service: Urology;  Laterality: Left;  90 mins req for this case      [facility]   LEFT AND RIGHT HEART CATHETERIZATION WITH CORONARY ANGIOGRAM [name] 09/04/2012   Procedure: LEFT AND RIGHT HEART CATHETERIZATION WITH CORONARY ANGIOGRAM;  Surgeon: Laverda Page, MD;  Location: [facility] CATH LAB;  Service: Cardiovascular;  Laterality: [name];   LEFT HEART CATHETERIZATION WITH CORONARY ANGIOGRAM [name] 05/02/2011   Procedure: LEFT HEART CATHETERIZATION WITH CORONARY ANGIOGRAM;  Surgeon: Josue Hector, MD;  Location: [facility] CATH  LAB;  Service: Cardiovascular;  Laterality: [name];   THORACOTOMY/LOBECTOMY Right 03-06-2008   AND STAPLING OF BLEBS FOR SPONTANOUS PNEUMOTHORAX   TRANSTHORACIC ECHOCARDIOGRAM  02-17-2011   MODERATE LVH/ LVSF NORMAL/ EF 52-84%/ GRADE I DIASTOLIC DYSFUNCTION    REVIEW OF SYSTEMS:  Constitutional: positive for fatigue Eyes: negative Ears, nose, mouth, throat, and face: negative Respiratory: positive for dyspnea on exertion Cardiovascular: negative Gastrointestinal: negative Genitourinary:negative Integument/breast: negative Hematologic/lymphatic: negative Musculoskeletal:negative Neurological: negative Behavioral/Psych: negative Endocrine: negative Allergic/Immunologic: negative   PHYSICAL EXAMINATION: General appearance: alert, cooperative, fatigued, and no distress Head: Normocephalic, without obvious abnormality, atraumatic Neck: no adenopathy, no JVD, supple, symmetrical, trachea midline, and thyroid not enlarged, symmetric, no tenderness/mass/nodules Lymph nodes: Cervical, supraclavicular, and axillary nodes normal. Resp: clear to auscultation bilaterally Back: symmetric, no curvature. ROM normal. No CVA tenderness. Cardio: regular rate and rhythm, S1, S2 normal, no murmur, click, rub or gallop GI: soft, non-tender; bowel sounds normal; no masses,  no organomegaly Extremities: extremities normal, atraumatic, no cyanosis or edema Neurologic: Alert and oriented X 3, normal strength and tone. Normal symmetric reflexes. Normal coordination and gait  ECOG PERFORMANCE STATUS: 1 - Symptomatic but completely ambulatory  Blood pressure 124/77, pulse 87, temperature (!) 97.4 F (36.3 C), temperature source Tympanic, resp. rate 18, height 6' (1.829 m), weight 188 lb 3.2 oz (85.4 kg), SpO2 94 %.  LABORATORY DATA: Lab Results  Component Value Date   WBC 7.9 03/08/2021   HGB 11.1 (L) 03/08/2021   HCT 34.4 (L) 03/08/2021   MCV 80.0 03/08/2021   PLT 307 03/08/2021      Chemistry       Component Value Date/Time   NA 133 (L) 03/08/2021 1020   K 3.5 03/08/2021 1020   CL 99 03/08/2021 1020   CO2 27 03/08/2021 1020   BUN 8 03/08/2021 1020   CREATININE 0.99 03/08/2021 1020      Component Value Date/Time   CALCIUM 8.7 (L) 03/08/2021 1020   ALKPHOS 74 03/08/2021 1020   AST 10 (L) 03/08/2021 1020   ALT 8 03/08/2021 1020   BILITOT 0.5 03/08/2021 1020       RADIOGRAPHIC STUDIES: CT Chest W  Contrast  Result Date: 03/05/2021 CLINICAL DATA:  Primary Cancer Type: Lung Imaging Indication: Assess response to therapy Interval therapy since last imaging? Yes Initial Cancer Diagnosis Date: 01/24/2019; Established by: Biopsy-proven Detailed Pathology: Initial diagnosis limited stage small cell lung cancer. Primary Tumor location: Right lung. Recurrence? Yes; Date(s) of recurrence: 09/12/2019; Established by: Biopsy-proven Surgeries: Right upper lobectomy and stapling of blebs for spontaneous pneumothorax 03/06/2008. Coronary stent 2017. Chemotherapy: Yes; Ongoing? No; Most recent administration: 12/09/2019 Immunotherapy?  Yes; Type: Imfinzi; Ongoing? Yes Radiation therapy? Yes Date Range: 05/18/2020 - 05/25/2020; Target: Right lung Date Range: 02/17/2020 - 02/28/2020; Target: Whole brain Date Range: 03/13/2019 - 03/20/2019; Target: Right lung EXAM: CT CHEST, ABDOMEN, AND PELVIS WITH CONTRAST TECHNIQUE: Multidetector CT imaging of the chest, abdomen and pelvis was performed following the standard protocol during bolus administration of intravenous contrast. RADIATION DOSE REDUCTION: This exam was performed according to the departmental dose-optimization program which includes automated exposure control, adjustment of the mA and/or kV according to patient size and/or use of iterative reconstruction technique. CONTRAST:  184mL OMNIPAQUE IOHEXOL 300 MG/ML  SOLN COMPARISON:  Most recent CT chest, abdomen and pelvis 01/07/2021. 08/19/2019 PET-CT. FINDINGS: CT CHEST FINDINGS Cardiovascular: The heart  size appears within normal limits. No pericardial effusion. Aortic atherosclerosis and coronary artery calcifications. Mediastinum/Nodes: Index right paratracheal lymph node measures 1 cm, image 17/2. Unchanged from previous exam. The enlarged right hilar lymph node is unchanged in size measuring 2.7 cm, image 37/2. No progressive mediastinal or hilar adenopathy identified at this time. Lungs/Pleura: Status post right upper lobectomy. No pleural effusion. Severe changes of bullous emphysema. Chronic volume loss within the medial left upper lobe along the left heart border is again noted. Previously referenced nodular component measures 1.8 cm, image 101/4. Stable from previous exam. Since the previous exam there is been interval development of numerous pulmonary nodules throughout both lungs. These are most numerous within the right lung. Some of these nodules have a central cavitary component. Index nodules include: -nodule within the anterior right lower lobe measures 0.7 cm, image 73/4. -cavitary nodule in the posterior right lower lobe measures 1.1 cm, image 128/4. -right middle lobe nodule measures 5 mm, image 62/4. -within the posteromedial left lower lobe there is a nodule measuring 6 mm, image 87/4. Musculoskeletal: No chest wall mass or suspicious bone lesions identified. CT ABDOMEN PELVIS FINDINGS Hepatobiliary: No suspicious liver lesion. 1.1 cm gallstone identified. No gallbladder wall thickening or inflammation. No significant bile duct dilatation. Pancreas: Unremarkable. No pancreatic ductal dilatation or surrounding inflammatory changes. Spleen: Normal in size without focal abnormality. Adrenals/Urinary Tract: Normal adrenal glands. Simple cyst arising off the upper pole of left kidney measures 3.3 cm, image 66/2. Smaller cyst arises off the inferior pole of the right kidney measuring 1 cm, image 77/2. No nephrolithiasis or hydronephrosis. Bladder is unremarkable. Stomach/Bowel: Moderate size hiatal  hernia. No pathologic dilatation of the large or small bowel loops. Vascular/Lymphatic: Aortic atherosclerosis. No aneurysm. No abdominopelvic adenopathy. Reproductive: Prostate is unremarkable. Other: No free fluid or fluid collections. Musculoskeletal: No aggressive lytic or sclerotic bone lesions. Degenerative disc disease noted at L5-S1. IMPRESSION: 1. Interval development of numerous pulmonary nodules throughout both lungs. Some of these nodules have a central cavitary component. Findings are concerning for metastatic disease. 2. Stable appearance of enlarged right hilar and prominent right paratracheal lymph nodes. 3. Cholelithiasis. 4. Hiatal hernia. 5. Aortic Atherosclerosis (ICD10-I70.0) and Emphysema (ICD10-J43.9). Electronically Signed   By: Kerby Moors [name]   On: 03/05/2021 11:20   CT  Abdomen Pelvis W Contrast  Result Date: 03/08/2021 CLINICAL DATA:  Primary Cancer Type: Lung Imaging Indication: Assess response to therapy Interval therapy since last imaging? Yes Initial Cancer Diagnosis Date: 01/24/2019; Established by: Biopsy-proven Detailed Pathology: Initial diagnosis limited stage small cell lung cancer. Primary Tumor location: Right lung. Recurrence? Yes; Date(s) of recurrence: 09/12/2019; Established by: Biopsy-proven Surgeries: Right upper lobectomy and stapling of blebs for spontaneous pneumothorax 03/06/2008. Coronary stent 2017. Chemotherapy: Yes; Ongoing? No; Most recent administration: 12/09/2019 Immunotherapy?  Yes; Type: Imfinzi; Ongoing? Yes Radiation therapy? Yes Date Range: 05/18/2020 - 05/25/2020; Target: Right lung Date Range: 02/17/2020 - 02/28/2020; Target: Whole brain Date Range: 03/13/2019 - 03/20/2019; Target: Right lung EXAM: CT CHEST, ABDOMEN, AND PELVIS WITH CONTRAST TECHNIQUE: Multidetector CT imaging of the chest, abdomen and pelvis was performed following the standard protocol during bolus administration of intravenous contrast. RADIATION DOSE REDUCTION: This exam was  performed according to the departmental dose-optimization program which includes automated exposure control, adjustment of the mA and/or kV according to patient size and/or use of iterative reconstruction technique. CONTRAST:  181mL OMNIPAQUE IOHEXOL 300 MG/ML  SOLN COMPARISON:  Most recent CT chest, abdomen and pelvis 01/07/2021. 08/19/2019 PET-CT. FINDINGS: CT CHEST FINDINGS Cardiovascular: The heart size appears within normal limits. No pericardial effusion. Aortic atherosclerosis and coronary artery calcifications. Mediastinum/Nodes: Index right paratracheal lymph node measures 1 cm, image 17/2. Unchanged from previous exam. The enlarged right hilar lymph node is unchanged in size measuring 2.7 cm, image 37/2. No progressive mediastinal or hilar adenopathy identified at this time. Lungs/Pleura: Status post right upper lobectomy. No pleural effusion. Severe changes of bullous emphysema. Chronic volume loss within the medial left upper lobe along the left heart border is again noted. Previously referenced nodular component measures 1.8 cm, image 101/4. Stable from previous exam. Since the previous exam there is been interval development of numerous pulmonary nodules throughout both lungs. These are most numerous within the right lung. Some of these nodules have a central cavitary component. Index nodules include: -nodule within the anterior right lower lobe measures 0.7 cm, image 73/4. -cavitary nodule in the posterior right lower lobe measures 1.1 cm, image 128/4. -right middle lobe nodule measures 5 mm, image 62/4. -within the posteromedial left lower lobe there is a nodule measuring 6 mm, image 87/4. Musculoskeletal: No chest wall mass or suspicious bone lesions identified. CT ABDOMEN PELVIS FINDINGS Hepatobiliary: No suspicious liver lesion. 1.1 cm gallstone identified. No gallbladder wall thickening or inflammation. No significant bile duct dilatation. Pancreas: Unremarkable. No pancreatic ductal dilatation  or surrounding inflammatory changes. Spleen: Normal in size without focal abnormality. Adrenals/Urinary Tract: Normal adrenal glands. Simple cyst arising off the upper pole of left kidney measures 3.3 cm, image 66/2. Smaller cyst arises off the inferior pole of the right kidney measuring 1 cm, image 77/2. No nephrolithiasis or hydronephrosis. Bladder is unremarkable. Stomach/Bowel: Moderate size hiatal hernia. No pathologic dilatation of the large or small bowel loops. Vascular/Lymphatic: Aortic atherosclerosis. No aneurysm. No abdominopelvic adenopathy. Reproductive: Prostate is unremarkable. Other: No free fluid or fluid collections. Musculoskeletal: No aggressive lytic or sclerotic bone lesions. Degenerative disc disease noted at L5-S1. IMPRESSION: 1. Interval development of numerous pulmonary nodules throughout both lungs. Some of these nodules have a central cavitary component. Findings are concerning for metastatic disease. 2. Stable appearance of enlarged right hilar and prominent right paratracheal lymph nodes. 3. Cholelithiasis. 4. Hiatal hernia. 5. Aortic Atherosclerosis (ICD10-I70.0) and Emphysema (ICD10-J43.9). Electronically Signed   By: Kerby Moors [name]   On: 03/08/2021 10:31  ASSESSMENT AND PLAN: This is a very pleasant 71 years old white male recently diagnosed with limited stage small cell lung cancer (T3, N2, M0) presented with pleural-based pulmonary nodules in the right lung in addition to mediastinal lymphadenopathy in August 2021.  The patient was initially diagnosed with a pleural-based nodule status post SBRT in March 2021. He completed systemic chemotherapy with carboplatin for AUC of 5 from day 1 and to etoposide 100 mg/M2 on days 1, 2 and 3 with Neulasta support every 3 weeks.  Status post 4 cycles. The patient tolerated his treatment well except for fatigue. He also underwent palliative radiotherapy to enlarging right lung nodule under the care of Dr. Sondra Come. The patient is  currently on observation.  He had repeat CT scan of the chest performed recently. I personally and independently reviewed the scan images and discussed the result and showed the images to the patient and his wife today. His scan showed new and enlarging bilateral pulmonary nodules and the enlarging right hilar lymph nodes consistent with metastatic disease. The patient is currently undergoing palliative systemic chemotherapy with carboplatin for AUC of 5 on day 1, etoposide 100 Mg/M2 on days 1, 2 and 3 with Cosela before the chemotherapy as well as Imfinzi 1500 Mg IV on day 1 every 3 weeks with the chemotherapy followed by maintenance every 4 weeks.  Status post 9 cycles.  Starting from cycle #5 the patient will be on maintenance treatment with single agent Imfinzi 1500 Mg IV every 4 weeks.  Cosela was discontinued after cycle #1 secondary to phlebitis and the patient refused to continue with the treatment. The patient has been tolerating his treatment with maintenance Imfinzi fairly well. He had repeat CT scan of the chest, abdomen pelvis performed recently.  I personally and independently reviewed the scan images and discussed the result and showed the images to the patient today. Unfortunately his scan showed interval development of numerous pulmonary nodules throughout both lungs some of these have central cavitation concerning for metastatic disease. I had a lengthy discussion with the patient and his wife today about his current condition and treatment options. I discussed with the patient the option of palliative care and hospice referral versus consideration of third line treatment with Zepzelca (lurbinectedin) 3.2 Mg/M2 every 3 weeks.  The patient is interested in proceeding with further treatment.  I discussed with him the adverse effect of this treatment including but not limited to alopecia, myelosuppression, nausea and vomiting, peripheral neuropathy, liver or renal dysfunction. He is expected  to start the first cycle of this treatment next week. He will come back for follow-up visit in 4 weeks for evaluation with the start of cycle #2 of his treatment. For the hypokalemia, we will continue to monitor his potassium closely and consider The patient for potassium supplement if needed. He was advised to call immediately if he has any other concerning symptoms in the interval.  The patient voices understanding of current disease status and treatment options and is in agreement with the current care plan.  All questions were answered. The patient knows to call the clinic with any problems, questions or concerns. We can certainly see the patient much sooner if necessary.  Disclaimer: This note was dictated with voice recognition software. Similar sounding words can inadvertently be transcribed and may not be corrected upon review.

## 2021-03-15 ENCOUNTER — Other Ambulatory Visit: Payer: Medicare Other

## 2021-03-15 ENCOUNTER — Ambulatory Visit: Payer: Medicare Other

## 2021-03-15 ENCOUNTER — Other Ambulatory Visit (HOSPITAL_COMMUNITY): Payer: Self-pay

## 2021-03-15 ENCOUNTER — Other Ambulatory Visit: Payer: Self-pay | Admitting: Cardiology

## 2021-03-15 MED ORDER — PANTOPRAZOLE SODIUM 20 MG PO TBEC
DELAYED_RELEASE_TABLET | Freq: Every day | ORAL | 2 refills | Status: AC
  Filled 2021-03-15: qty 90, 90d supply, fill #0
  Filled 2021-03-26: qty 90, 90d supply, fill #1

## 2021-03-16 ENCOUNTER — Inpatient Hospital Stay: Payer: Medicare Other

## 2021-03-16 ENCOUNTER — Other Ambulatory Visit (HOSPITAL_COMMUNITY): Payer: Self-pay

## 2021-03-16 ENCOUNTER — Inpatient Hospital Stay: Payer: Medicare Other | Admitting: Dietician

## 2021-03-16 ENCOUNTER — Encounter: Payer: Medicare Other | Admitting: Dietician

## 2021-03-16 ENCOUNTER — Encounter: Payer: Self-pay | Admitting: *Deleted

## 2021-03-16 ENCOUNTER — Other Ambulatory Visit: Payer: Self-pay

## 2021-03-16 VITALS — BP 110/65 | HR 64 | Temp 98.2°F | Resp 18 | Wt 189.5 lb

## 2021-03-16 DIAGNOSIS — C3491 Malignant neoplasm of unspecified part of right bronchus or lung: Secondary | ICD-10-CM

## 2021-03-16 DIAGNOSIS — I878 Other specified disorders of veins: Secondary | ICD-10-CM

## 2021-03-16 DIAGNOSIS — Z5111 Encounter for antineoplastic chemotherapy: Secondary | ICD-10-CM | POA: Diagnosis not present

## 2021-03-16 LAB — CBC WITH DIFFERENTIAL (CANCER CENTER ONLY)
Abs Immature Granulocytes: 0.04 10*3/uL (ref 0.00–0.07)
Basophils Absolute: 0 10*3/uL (ref 0.0–0.1)
Basophils Relative: 0 %
Eosinophils Absolute: 0.1 10*3/uL (ref 0.0–0.5)
Eosinophils Relative: 1 %
HCT: 34.6 % — ABNORMAL LOW (ref 39.0–52.0)
Hemoglobin: 11.4 g/dL — ABNORMAL LOW (ref 13.0–17.0)
Immature Granulocytes: 0 %
Lymphocytes Relative: 14 %
Lymphs Abs: 1.3 10*3/uL (ref 0.7–4.0)
MCH: 26.1 pg (ref 26.0–34.0)
MCHC: 32.9 g/dL (ref 30.0–36.0)
MCV: 79.4 fL — ABNORMAL LOW (ref 80.0–100.0)
Monocytes Absolute: 0.9 10*3/uL (ref 0.1–1.0)
Monocytes Relative: 10 %
Neutro Abs: 6.8 10*3/uL (ref 1.7–7.7)
Neutrophils Relative %: 75 %
Platelet Count: 320 10*3/uL (ref 150–400)
RBC: 4.36 MIL/uL (ref 4.22–5.81)
RDW: 16.6 % — ABNORMAL HIGH (ref 11.5–15.5)
WBC Count: 9.2 10*3/uL (ref 4.0–10.5)
nRBC: 0 % (ref 0.0–0.2)

## 2021-03-16 LAB — CMP (CANCER CENTER ONLY)
ALT: 7 U/L (ref 0–44)
AST: 9 U/L — ABNORMAL LOW (ref 15–41)
Albumin: 3.4 g/dL — ABNORMAL LOW (ref 3.5–5.0)
Alkaline Phosphatase: 74 U/L (ref 38–126)
Anion gap: 7 (ref 5–15)
BUN: 9 mg/dL (ref 8–23)
CO2: 26 mmol/L (ref 22–32)
Calcium: 9 mg/dL (ref 8.9–10.3)
Chloride: 101 mmol/L (ref 98–111)
Creatinine: 0.9 mg/dL (ref 0.61–1.24)
GFR, Estimated: >60 mL/min (ref >60)
Glucose, Bld: 144 mg/dL — ABNORMAL HIGH (ref 70–99)
Potassium: 3.5 mmol/L (ref 3.5–5.1)
Sodium: 134 mmol/L — ABNORMAL LOW (ref 135–145)
Total Bilirubin: 0.5 mg/dL (ref 0.3–1.2)
Total Protein: 7.7 g/dL (ref 6.5–8.1)

## 2021-03-16 MED ORDER — SODIUM CHLORIDE 0.9 % IV SOLN
3.2000 mg/m2 | Freq: Once | INTRAVENOUS | Status: AC
  Administered 2021-03-16: 6.65 mg via INTRAVENOUS
  Filled 2021-03-16: qty 13.3

## 2021-03-16 MED ORDER — PALONOSETRON HCL INJECTION 0.25 MG/5ML
0.2500 mg | Freq: Once | INTRAVENOUS | Status: AC
  Administered 2021-03-16: 0.25 mg via INTRAVENOUS
  Filled 2021-03-16: qty 5

## 2021-03-16 MED ORDER — SODIUM CHLORIDE 0.9 % IV SOLN
10.0000 mg | Freq: Once | INTRAVENOUS | Status: AC
  Administered 2021-03-16: 10 mg via INTRAVENOUS
  Filled 2021-03-16: qty 10

## 2021-03-16 MED ORDER — NORTRIPTYLINE HCL 25 MG PO CAPS
25.0000 mg | ORAL_CAPSULE | Freq: Every day | ORAL | 0 refills | Status: AC
  Filled 2021-03-16: qty 90, 90d supply, fill #0

## 2021-03-16 MED ORDER — SODIUM CHLORIDE 0.9 % IV SOLN: Freq: Once | INTRAVENOUS | Status: AC

## 2021-03-16 NOTE — Patient Instructions (Signed)
[facility]  Discharge Instructions: Thank you for choosing [facility] to provide your oncology and hematology care.   If you have a lab appointment with the [facility], please go directly to the [facility] and check in at the registration area.   Wear comfortable clothing and clothing appropriate for easy access to any Portacath or PICC line.   We strive to give you quality time with your provider. You may need to reschedule your appointment if you arrive late (15 or more minutes).  Arriving late affects you and other patients whose appointments are after yours.  Also, if you miss three or more appointments without notifying the office, you may be dismissed from the clinic at the providers discretion.      For prescription refill requests, have your pharmacy contact our office and allow 72 hours for refills to be completed.    Today you received the following chemotherapy and/or immunotherapy agents Zepzelca      To help prevent nausea and vomiting after your treatment, we encourage you to take your nausea medication as directed.  BELOW ARE SYMPTOMS THAT SHOULD BE REPORTED IMMEDIATELY: *FEVER GREATER THAN 100.4 F (38 C) OR HIGHER *CHILLS OR SWEATING *NAUSEA AND VOMITING THAT IS NOT CONTROLLED WITH YOUR NAUSEA MEDICATION *UNUSUAL SHORTNESS OF BREATH *UNUSUAL BRUISING OR BLEEDING *URINARY PROBLEMS (pain or burning when urinating, or frequent urination) *BOWEL PROBLEMS (unusual diarrhea, constipation, pain near the anus) TENDERNESS IN MOUTH AND THROAT WITH OR WITHOUT PRESENCE OF ULCERS (sore throat, sores in mouth, or a toothache) UNUSUAL RASH, SWELLING OR PAIN  UNUSUAL VAGINAL DISCHARGE OR ITCHING   Items with * indicate a potential emergency and should be followed up as soon as possible or go to the Emergency Department if any problems should occur.  Please show the CHEMOTHERAPY ALERT CARD or IMMUNOTHERAPY ALERT CARD at check-in to  the Emergency Department and triage nurse.  Should you have questions after your visit or need to cancel or reschedule your appointment, please contact [facility]  Dept: [phone]  and follow the prompts.  Office hours are 8:00 a.m. to 4:30 p.m. Monday - Friday. Please note that voicemails left after 4:00 p.m. may not be returned until the following business day.  We are closed weekends and major holidays. You have access to a nurse at all times for urgent questions. Please call the main number to the clinic Dept: [phone] and follow the prompts.   For any non-urgent questions, you may also contact your provider using MyChart. We now offer e-Visits for anyone 37 and older to request care online for non-urgent symptoms. For details visit [email or web address].   Also download the MyChart app! Go to the app store, search "MyChart", open the app, select [facility], and log in with your MyChart username and password.  Due to Covid, a mask is required upon entering the hospital/clinic. If you do not have a mask, one will be given to you upon arrival. For doctor visits, patients may have 1 support person aged 10 or older with them. For treatment visits, patients cannot have anyone with them due to current Covid guidelines and our immunocompromised population.   Lurbinectedin Injection What is this medication? LURBINECTEDIN (LOOR bin EK te din) treats lung cancer. It works by slowing down the growth of cancer cells. This medicine may be used for other purposes; ask your health care provider or pharmacist if you have questions. COMMON BRAND NAME(S): ZEPZELCA What  should I tell my care team before I take this medication? They need to know if you have any of these conditions: Liver disease Low blood counts--white cells, platelets, red blood cells An unusual or allergic reaction to lurbinectedin, other medicines, foods, dyes or preservatives Pregnant or trying to  get pregnant Beast-feeding How should I use this medication? This medication is injected into a vein. It is given in a hospital or clinic setting. Talk to your care team about the use of this medicine in children. Special care may be needed. Overdosage: If you think you have taken too much of this medicine contact a poison control center or emergency room at once. NOTE: This medicine is only for you. Do not share this medicine with others. What if I miss a dose? Keep appointments for follow-up doses. It is important not to miss your dose. Call your care team if you are unable to keep an appointment. What may interact with this medication? Certain antibiotics like erythromycin or clarithromycin Certain antivirals for HIV or hepatitis Certain medicines for fungal infections like ketoconazole, itraconazole, posaconazole Certain medicines for seizures like carbamazepine, phenobarbital, phenytoin Grapefruit or grapefruit juice St. John's Wort This list may not describe all possible interactions. Give your health care provider a list of all the medicines, herbs, non-prescription drugs, or dietary supplements you use. Also tell them if you smoke, drink alcohol, or use illegal drugs. Some items may interact with your medicine. What should I watch for while using this medication? This medication may make you feel generally unwell. This is not uncommon as chemotherapy can affect healthy cells as well as cancer cells. Report any side effects. Continue your course of treatment even though you feel ill unless your care team tells you to stop. This medication may increase your risk of getting an infection. Call your care team for advice if you get a fever, chills, sore throat, or other symptoms of a cold or flu. Do not treat yourself. Try to avoid being around people who are sick. Avoid taking medications that contain aspirin, acetaminophen, ibuprofen, naproxen, or ketoprofen unless instructed by your care  team. These medications may hide a fever. Be careful brushing or flossing your teeth or using a toothpick because you may get an infection or bleed more easily. If you have any dental work done, tell your dentist you are receiving this medication. Do not become pregnant while taking this medication or for 6 months after stopping it. Women should inform their care team if they wish to become pregnant or think they might be pregnant. Men should not father a child while taking this medication and for 4 months after stopping it. There is potential for serious side effects to an unborn child. Talk to your care team for more information. Do not breast-feed a child while taking this medication or for 2 weeks after stopping it. What side effects may I notice from receiving this medication? Side effects that you should report to your care team as soon as possible: Allergic reactions--skin rash, itching, hives, swelling of the face, lips, tongue, or throat Infection--fever, chills, cough, or sore throat Kidney injury--decrease in the amount of urine, swelling of the ankles, hands, or feet Leakage of medicine out of your vein during the infusion Liver injury--right upper belly pain, loss of appetite, nausea, light-colored stool, dark yellow or brown urine, yellowing skin or eyes, unusual weakness or fatigue Low red blood cell count--unusual weakness or fatigue, dizziness, headache, trouble breathing Muscle injury--unusual weakness or fatigue,  muscle pain, dark yellow or brown urine, decrease in amount of urine Painful swelling, warmth, or redness of the skin, blisters, or sores Unusual bruising or bleeding Vomiting Side effects that usually do not require medical attention (report these to your care team if they continue or are bothersome): Constipation Cough Diarrhea High blood sugar (hyperglycemia)--increased thirst or amount of urine, unusual weakness or fatigue, blurry vision Loss of appetite Low  magnesium level--muscle pain or cramps, unusual weakness or fatigue, fast or irregular heartbeat, tremors Low sodium level--muscle weakness, fatigue, dizziness, headache, confusion Nausea Trouble breathing This list may not describe all possible side effects. Call your doctor for medical advice about side effects. You may report side effects to FDA at 1-800-FDA-1088. Where should I keep my medication? This medicine is given in a hospital or clinic and will not be stored at home. NOTE: This sheet is a summary. It may not cover all possible information. If you have questions about this medicine, talk to your doctor, pharmacist, or health care provider.  2022 Elsevier/Gold Standard (2020-04-29 00:00:00)

## 2021-03-16 NOTE — Progress Notes (Signed)
IV team here for IV start.  Blood return noted at IV site before Zepzelca infusion started.

## 2021-03-16 NOTE — Progress Notes (Signed)
Pt scheduled for first time zepzelca treatment today.  Zepzelca is a known vessicant.  Pt has poor venous access with previous issues with irritation during chemo infusions.  Discussed with Dr Julien Nordmann.  Per Dr Julien Nordmann, will proceed today without central line.  Will reevualuate on return visit.

## 2021-03-16 NOTE — Progress Notes (Signed)
Nutrition Assessment   Reason for Assessment: MST   ASSESSMENT: 71 year old male with recurrent small cell lung cancer. He is currently receiving zepzelca q3w (first dose today). Patient is followed by Dr. Julien Nordmann.  Past medical history includes COPD, DM2, CVA, hemorrhagic left-sided stroke, CAD, NSTEMI, dCHF, HTN  Met with patient during infusion. He reports his appetite is not good. Patient is unable to eat like he is used to, reports no longer wanting food after a few bites. Patient typically has not eaten much for breakfast. He enjoys 2 cups of coffee with cream and a banana. Patient will snack on fruits during the day. Patient recalls fish, vegetables, rice for dinner. Patient does not like meat, eggs, or peanut butter. Patient denies nausea, vomiting, diarrhea, constipation.   Nutrition Focused Physical Exam: deferred   Medications: B12, Flexeril, Pradaxa, Gabapentin, Metformin, Pamelor, Protonix, Klor-con, Compazine, Restoril   Labs: Na 134, Glucose 144   Anthropometrics: Patient is 8% (~16 lbs) under usual weight  Height: 6' Weight: 189 lb 8 oz UBW: 196-205 lb (in last 6 months) BMI: 25.70   NUTRITION DIAGNOSIS: Unintended weight loss related to recurrent lung cancer and associated treatments as evidenced by reported decreased appetite, diet low in calories and protein per dietary recall, 7.8% decrease from usual weight in the last 6 months   INTERVENTION:  Educated on small frequent meals and snacks with adequate calories and protein - handout with ideas provided Discussed tips for increasing calories and protein, making the most of every bite/sip (having cheese with fruit, eating yogurt with banana in the morning, drinking whole milk, adding gravy/sauces to foods, adding supplement to coffee) - handout provided  Suggested trying oral nutrition supplement, recommend drinking 3 Ensure Plus/equivalent daily - pt provided samples and coupons today Contact information  provided     MONITORING, EVALUATION, GOAL: Patient will tolerate increased calories and protein to minimize weight loss   Next Visit: Monday March 20 during infusion with Desmond Lope

## 2021-03-18 ENCOUNTER — Telehealth: Payer: Self-pay

## 2021-03-18 NOTE — Telephone Encounter (Signed)
Message left for patient that this nurse was calling from Dr. Worthy Flank office to see how he is doing after his first treatment on 03-16-21. ?He can call the office at [phone] if he is has any questions or concerns. ?

## 2021-03-18 NOTE — Telephone Encounter (Signed)
-----   Message from Rolene Course, RN sent at 03/16/2021  2:27 PM EST ----- ?Regarding: Jonathon [name] 1st Tx F/U call - Zepzelca ?Jonathon [name] 1st Tx F/U call - Zepzelca.  Tolerated infusion well. ? ?

## 2021-03-19 ENCOUNTER — Telehealth: Payer: Self-pay | Admitting: Internal Medicine

## 2021-03-19 NOTE — Telephone Encounter (Signed)
Scheduled per 02/20 los, patient has been called and voicemail was left regarding upcoming appointments. ?

## 2021-03-20 ENCOUNTER — Inpatient Hospital Stay (HOSPITAL_COMMUNITY)
Admission: EM | Admit: 2021-03-20 | Discharge: 2021-03-23 | DRG: 193 | Disposition: A | Discharge status: Home / Self Care | Payer: Medicare Other | Attending: Family Medicine | Admitting: Family Medicine

## 2021-03-20 ENCOUNTER — Encounter (HOSPITAL_COMMUNITY): Payer: Self-pay | Admitting: Emergency Medicine

## 2021-03-20 ENCOUNTER — Emergency Department (HOSPITAL_COMMUNITY): Payer: Medicare Other

## 2021-03-20 ENCOUNTER — Other Ambulatory Visit: Payer: Self-pay

## 2021-03-20 DIAGNOSIS — J189 Pneumonia, unspecified organism: Secondary | ICD-10-CM | POA: Diagnosis not present

## 2021-03-20 DIAGNOSIS — Z955 Presence of coronary angioplasty implant and graft: Secondary | ICD-10-CM

## 2021-03-20 DIAGNOSIS — Z8249 Family history of ischemic heart disease and other diseases of the circulatory system: Secondary | ICD-10-CM

## 2021-03-20 DIAGNOSIS — E785 Hyperlipidemia, unspecified: Secondary | ICD-10-CM | POA: Insufficient documentation

## 2021-03-20 DIAGNOSIS — E7800 Pure hypercholesterolemia, unspecified: Secondary | ICD-10-CM | POA: Diagnosis present

## 2021-03-20 DIAGNOSIS — I10 Essential (primary) hypertension: Secondary | ICD-10-CM

## 2021-03-20 DIAGNOSIS — I2510 Atherosclerotic heart disease of native coronary artery without angina pectoris: Secondary | ICD-10-CM | POA: Diagnosis present

## 2021-03-20 DIAGNOSIS — E871 Hypo-osmolality and hyponatremia: Secondary | ICD-10-CM | POA: Diagnosis present

## 2021-03-20 DIAGNOSIS — C3431 Malignant neoplasm of lower lobe, right bronchus or lung: Secondary | ICD-10-CM | POA: Diagnosis present

## 2021-03-20 DIAGNOSIS — Z888 Allergy status to other drugs, medicaments and biological substances status: Secondary | ICD-10-CM

## 2021-03-20 DIAGNOSIS — J439 Emphysema, unspecified: Secondary | ICD-10-CM | POA: Diagnosis present

## 2021-03-20 DIAGNOSIS — E1169 Type 2 diabetes mellitus with other specified complication: Secondary | ICD-10-CM | POA: Insufficient documentation

## 2021-03-20 DIAGNOSIS — Z85118 Personal history of other malignant neoplasm of bronchus and lung: Secondary | ICD-10-CM

## 2021-03-20 DIAGNOSIS — K219 Gastro-esophageal reflux disease without esophagitis: Secondary | ICD-10-CM | POA: Diagnosis present

## 2021-03-20 DIAGNOSIS — Z902 Acquired absence of lung [part of]: Secondary | ICD-10-CM

## 2021-03-20 DIAGNOSIS — Z833 Family history of diabetes mellitus: Secondary | ICD-10-CM

## 2021-03-20 DIAGNOSIS — Z7951 Long term (current) use of inhaled steroids: Secondary | ICD-10-CM

## 2021-03-20 DIAGNOSIS — Z7984 Long term (current) use of oral hypoglycemic drugs: Secondary | ICD-10-CM

## 2021-03-20 DIAGNOSIS — E119 Type 2 diabetes mellitus without complications: Secondary | ICD-10-CM | POA: Diagnosis present

## 2021-03-20 DIAGNOSIS — Z823 Family history of stroke: Secondary | ICD-10-CM

## 2021-03-20 DIAGNOSIS — I110 Hypertensive heart disease with heart failure: Secondary | ICD-10-CM | POA: Diagnosis present

## 2021-03-20 DIAGNOSIS — C3490 Malignant neoplasm of unspecified part of unspecified bronchus or lung: Secondary | ICD-10-CM

## 2021-03-20 DIAGNOSIS — Z923 Personal history of irradiation: Secondary | ICD-10-CM

## 2021-03-20 DIAGNOSIS — Z8673 Personal history of transient ischemic attack (TIA), and cerebral infarction without residual deficits: Secondary | ICD-10-CM

## 2021-03-20 DIAGNOSIS — K5900 Constipation, unspecified: Secondary | ICD-10-CM | POA: Diagnosis present

## 2021-03-20 DIAGNOSIS — Z79899 Other long term (current) drug therapy: Secondary | ICD-10-CM

## 2021-03-20 DIAGNOSIS — C771 Secondary and unspecified malignant neoplasm of intrathoracic lymph nodes: Secondary | ICD-10-CM | POA: Diagnosis present

## 2021-03-20 DIAGNOSIS — C3491 Malignant neoplasm of unspecified part of right bronchus or lung: Secondary | ICD-10-CM | POA: Diagnosis present

## 2021-03-20 DIAGNOSIS — I5032 Chronic diastolic (congestive) heart failure: Secondary | ICD-10-CM | POA: Diagnosis present

## 2021-03-20 DIAGNOSIS — F17210 Nicotine dependence, cigarettes, uncomplicated: Secondary | ICD-10-CM | POA: Diagnosis present

## 2021-03-20 DIAGNOSIS — J9601 Acute respiratory failure with hypoxia: Secondary | ICD-10-CM | POA: Diagnosis present

## 2021-03-20 DIAGNOSIS — J449 Chronic obstructive pulmonary disease, unspecified: Secondary | ICD-10-CM

## 2021-03-20 DIAGNOSIS — Z825 Family history of asthma and other chronic lower respiratory diseases: Secondary | ICD-10-CM

## 2021-03-20 DIAGNOSIS — Z20822 Contact with and (suspected) exposure to covid-19: Secondary | ICD-10-CM | POA: Diagnosis present

## 2021-03-20 DIAGNOSIS — I252 Old myocardial infarction: Secondary | ICD-10-CM

## 2021-03-20 LAB — COMPREHENSIVE METABOLIC PANEL
ALT: 41 U/L (ref 0–44)
AST: 35 U/L (ref 15–41)
Albumin: 3 g/dL — ABNORMAL LOW (ref 3.5–5.0)
Alkaline Phosphatase: 65 U/L (ref 38–126)
Anion gap: 7 (ref 5–15)
BUN: 29 mg/dL — ABNORMAL HIGH (ref 8–23)
CO2: 25 mmol/L (ref 22–32)
Calcium: 8.2 mg/dL — ABNORMAL LOW (ref 8.9–10.3)
Chloride: 98 mmol/L (ref 98–111)
Creatinine, Ser: 1.05 mg/dL (ref 0.61–1.24)
GFR, Estimated: >60 mL/min (ref >60)
Glucose, Bld: 109 mg/dL — ABNORMAL HIGH (ref 70–99)
Potassium: 4.3 mmol/L (ref 3.5–5.1)
Sodium: 130 mmol/L — ABNORMAL LOW (ref 135–145)
Total Bilirubin: 0.7 mg/dL (ref 0.3–1.2)
Total Protein: 7 g/dL (ref 6.5–8.1)

## 2021-03-20 LAB — URINALYSIS, ROUTINE W REFLEX MICROSCOPIC
Bilirubin Urine: NEGATIVE
Glucose, UA: NEGATIVE mg/dL
Hgb urine dipstick: NEGATIVE
Ketones, ur: NEGATIVE mg/dL
Leukocytes,Ua: NEGATIVE
Nitrite: NEGATIVE
Protein, ur: NEGATIVE mg/dL
Specific Gravity, Urine: 1.014 (ref 1.005–1.030)
pH: 5 (ref 5.0–8.0)

## 2021-03-20 LAB — CBC WITH DIFFERENTIAL/PLATELET
Abs Immature Granulocytes: 0 10*3/uL (ref 0.00–0.07)
Basophils Absolute: 0 10*3/uL (ref 0.0–0.1)
Basophils Relative: 0 %
Eosinophils Absolute: 0.1 10*3/uL (ref 0.0–0.5)
Eosinophils Relative: 1 %
HCT: 34.6 % — ABNORMAL LOW (ref 39.0–52.0)
Hemoglobin: 11 g/dL — ABNORMAL LOW (ref 13.0–17.0)
Immature Granulocytes: 0 %
Lymphocytes Relative: 24 %
Lymphs Abs: 1.2 10*3/uL (ref 0.7–4.0)
MCH: 26 pg (ref 26.0–34.0)
MCHC: 31.8 g/dL (ref 30.0–36.0)
MCV: 81.8 fL (ref 80.0–100.0)
Monocytes Absolute: 0.1 10*3/uL (ref 0.1–1.0)
Monocytes Relative: 3 %
Neutro Abs: 3.4 10*3/uL (ref 1.7–7.7)
Neutrophils Relative %: 72 %
Platelets: 233 10*3/uL (ref 150–400)
RBC: 4.23 MIL/uL (ref 4.22–5.81)
RDW: 17.1 % — ABNORMAL HIGH (ref 11.5–15.5)
WBC: 4.8 10*3/uL (ref 4.0–10.5)
nRBC: 0 % (ref 0.0–0.2)

## 2021-03-20 LAB — RESP PANEL BY RT-PCR (FLU A&B, COVID) ARPGX2
Influenza A by PCR: NEGATIVE
Influenza B by PCR: NEGATIVE
SARS Coronavirus 2 by RT PCR: NEGATIVE

## 2021-03-20 LAB — LACTIC ACID, PLASMA
Lactic Acid, Venous: 1 mmol/L (ref 0.5–1.9)
Lactic Acid, Venous: 1 mmol/L (ref 0.5–1.9)

## 2021-03-20 LAB — GLUCOSE, CAPILLARY
Glucose-Capillary: 126 mg/dL — ABNORMAL HIGH (ref 70–99)
Glucose-Capillary: 181 mg/dL — ABNORMAL HIGH (ref 70–99)

## 2021-03-20 MED ORDER — AMLODIPINE BESYLATE 10 MG PO TABS
10.0000 mg | ORAL_TABLET | Freq: Every evening | ORAL | Status: DC
  Administered 2021-03-21 – 2021-03-22 (×2): 10 mg via ORAL
  Filled 2021-03-20 (×2): qty 1

## 2021-03-20 MED ORDER — PROCHLORPERAZINE MALEATE 10 MG PO TABS: 10.0000 mg | ORAL_TABLET | Freq: Four times a day (QID) | ORAL | Status: DC | PRN

## 2021-03-20 MED ORDER — MOMETASONE FURO-FORMOTEROL FUM 200-5 MCG/ACT IN AERO
2.0000 | INHALATION_SPRAY | Freq: Two times a day (BID) | RESPIRATORY_TRACT | Status: DC
Start: 2021-03-20 — End: 2021-03-23
  Administered 2021-03-20 – 2021-03-23 (×6): 2 via RESPIRATORY_TRACT
  Filled 2021-03-20: qty 8.8

## 2021-03-20 MED ORDER — SODIUM CHLORIDE 0.9 % IV SOLN
1.0000 g | Freq: Once | INTRAVENOUS | Status: AC
  Administered 2021-03-20: 1 g via INTRAVENOUS
  Filled 2021-03-20: qty 10

## 2021-03-20 MED ORDER — BISOPROLOL FUMARATE 5 MG PO TABS
5.0000 mg | ORAL_TABLET | Freq: Every evening | ORAL | Status: DC
  Administered 2021-03-20 – 2021-03-22 (×3): 5 mg via ORAL
  Filled 2021-03-20 (×3): qty 1

## 2021-03-20 MED ORDER — CLONIDINE HCL 0.1 MG PO TABS
0.3000 mg | ORAL_TABLET | Freq: Two times a day (BID) | ORAL | Status: DC
  Administered 2021-03-20 – 2021-03-23 (×6): 0.3 mg via ORAL
  Filled 2021-03-20 (×6): qty 3

## 2021-03-20 MED ORDER — ATORVASTATIN CALCIUM 10 MG PO TABS
20.0000 mg | ORAL_TABLET | Freq: Every day | ORAL | Status: DC
  Administered 2021-03-20 – 2021-03-23 (×4): 20 mg via ORAL
  Filled 2021-03-20 (×4): qty 2

## 2021-03-20 MED ORDER — TEMAZEPAM 15 MG PO CAPS
15.0000 mg | ORAL_CAPSULE | Freq: Every day | ORAL | Status: DC
  Administered 2021-03-20 – 2021-03-22 (×3): 15 mg via ORAL
  Filled 2021-03-20 (×3): qty 1

## 2021-03-20 MED ORDER — ALBUTEROL SULFATE (2.5 MG/3ML) 0.083% IN NEBU
2.5000 mg | INHALATION_SOLUTION | RESPIRATORY_TRACT | Status: DC | PRN
  Administered 2021-03-21: 2.5 mg via RESPIRATORY_TRACT
  Filled 2021-03-20: qty 3

## 2021-03-20 MED ORDER — INSULIN ASPART 100 UNIT/ML IJ SOLN
0.0000 [IU] | Freq: Three times a day (TID) | INTRAMUSCULAR | Status: DC
  Administered 2021-03-21: 1 [IU] via SUBCUTANEOUS
  Administered 2021-03-21: 2 [IU] via SUBCUTANEOUS
  Administered 2021-03-21: 3 [IU] via SUBCUTANEOUS
  Administered 2021-03-22 – 2021-03-23 (×3): 1 [IU] via SUBCUTANEOUS

## 2021-03-20 MED ORDER — MORPHINE SULFATE (PF) 2 MG/ML IV SOLN
2.0000 mg | INTRAVENOUS | Status: DC | PRN
  Administered 2021-03-20 – 2021-03-23 (×5): 2 mg via INTRAVENOUS
  Filled 2021-03-20 (×5): qty 1

## 2021-03-20 MED ORDER — SODIUM CHLORIDE 0.9 % IV SOLN
2.0000 g | INTRAVENOUS | Status: DC
  Administered 2021-03-21 – 2021-03-22 (×2): 2 g via INTRAVENOUS
  Filled 2021-03-20 (×3): qty 20

## 2021-03-20 MED ORDER — GUAIFENESIN ER 600 MG PO TB12
600.0000 mg | ORAL_TABLET | Freq: Two times a day (BID) | ORAL | Status: DC
  Administered 2021-03-20 – 2021-03-23 (×6): 600 mg via ORAL
  Filled 2021-03-20 (×6): qty 1

## 2021-03-20 MED ORDER — CYCLOBENZAPRINE HCL 5 MG PO TABS
5.0000 mg | ORAL_TABLET | Freq: Every day | ORAL | Status: DC
  Administered 2021-03-20 – 2021-03-22 (×3): 5 mg via ORAL
  Filled 2021-03-20 (×3): qty 1

## 2021-03-20 MED ORDER — ENOXAPARIN SODIUM 40 MG/0.4ML IJ SOSY
40.0000 mg | PREFILLED_SYRINGE | INTRAMUSCULAR | Status: DC
  Administered 2021-03-20 – 2021-03-22 (×3): 40 mg via SUBCUTANEOUS
  Filled 2021-03-20 (×3): qty 0.4

## 2021-03-20 MED ORDER — AZITHROMYCIN 250 MG PO TABS
500.0000 mg | ORAL_TABLET | Freq: Every day | ORAL | Status: DC
  Administered 2021-03-21 – 2021-03-23 (×3): 500 mg via ORAL
  Filled 2021-03-20 (×3): qty 2

## 2021-03-20 MED ORDER — LACTATED RINGERS IV BOLUS
1000.0000 mL | Freq: Once | INTRAVENOUS | Status: AC
  Administered 2021-03-20: 1000 mL via INTRAVENOUS

## 2021-03-20 MED ORDER — LOSARTAN POTASSIUM 50 MG PO TABS
100.0000 mg | ORAL_TABLET | Freq: Every evening | ORAL | Status: DC
  Administered 2021-03-21 – 2021-03-22 (×2): 100 mg via ORAL
  Filled 2021-03-20 (×2): qty 2

## 2021-03-20 MED ORDER — SODIUM CHLORIDE 0.9 % IV SOLN
500.0000 mg | Freq: Once | INTRAVENOUS | Status: AC
  Administered 2021-03-20: 500 mg via INTRAVENOUS
  Filled 2021-03-20: qty 5

## 2021-03-20 MED ORDER — SODIUM CHLORIDE 0.9 % IV SOLN: Freq: Once | INTRAVENOUS | Status: AC

## 2021-03-20 MED ORDER — PANTOPRAZOLE SODIUM 20 MG PO TBEC
20.0000 mg | DELAYED_RELEASE_TABLET | Freq: Every evening | ORAL | Status: DC
  Administered 2021-03-20 – 2021-03-22 (×3): 20 mg via ORAL
  Filled 2021-03-20 (×3): qty 1

## 2021-03-20 MED ORDER — TIOTROPIUM BROMIDE MONOHYDRATE 2.5 MCG/ACT IN AERS: 1.0000 | INHALATION_SPRAY | Freq: Two times a day (BID) | RESPIRATORY_TRACT | Status: DC

## 2021-03-20 MED ORDER — UMECLIDINIUM BROMIDE 62.5 MCG/ACT IN AEPB
1.0000 | INHALATION_SPRAY | Freq: Every day | RESPIRATORY_TRACT | Status: DC
  Administered 2021-03-21 – 2021-03-23 (×3): 1 via RESPIRATORY_TRACT
  Filled 2021-03-20: qty 7

## 2021-03-20 MED ORDER — NORTRIPTYLINE HCL 25 MG PO CAPS
25.0000 mg | ORAL_CAPSULE | Freq: Every day | ORAL | Status: DC
Start: 2021-03-20 — End: 2021-03-23
  Administered 2021-03-20 – 2021-03-22 (×3): 25 mg via ORAL
  Filled 2021-03-20 (×3): qty 1

## 2021-03-20 MED ORDER — PROCHLORPERAZINE EDISYLATE 10 MG/2ML IJ SOLN
10.0000 mg | Freq: Four times a day (QID) | INTRAMUSCULAR | Status: DC | PRN
  Administered 2021-03-20: 10 mg via INTRAVENOUS
  Filled 2021-03-20: qty 2

## 2021-03-20 MED ORDER — DULOXETINE HCL 30 MG PO CPEP
30.0000 mg | ORAL_CAPSULE | Freq: Every day | ORAL | Status: DC
Start: 2021-03-20 — End: 2021-03-23
  Administered 2021-03-20 – 2021-03-22 (×3): 30 mg via ORAL
  Filled 2021-03-20 (×3): qty 1

## 2021-03-20 MED ORDER — INSULIN ASPART 100 UNIT/ML IJ SOLN: 0.0000 [IU] | Freq: Every day | INTRAMUSCULAR | Status: DC

## 2021-03-20 MED ORDER — GABAPENTIN 400 MG PO CAPS
1200.0000 mg | ORAL_CAPSULE | Freq: Two times a day (BID) | ORAL | Status: DC
Start: 2021-03-20 — End: 2021-03-23
  Administered 2021-03-20 – 2021-03-23 (×6): 1200 mg via ORAL
  Filled 2021-03-20 (×7): qty 3

## 2021-03-20 MED ORDER — POLYETHYLENE GLYCOL 3350 17 G PO PACK: 17.0000 g | PACK | Freq: Every day | ORAL | Status: DC | PRN

## 2021-03-20 MED ORDER — DOCUSATE SODIUM 100 MG PO CAPS
100.0000 mg | ORAL_CAPSULE | Freq: Two times a day (BID) | ORAL | Status: DC
  Administered 2021-03-20 – 2021-03-23 (×6): 100 mg via ORAL
  Filled 2021-03-20 (×6): qty 1

## 2021-03-20 MED ORDER — TAMSULOSIN HCL 0.4 MG PO CAPS
0.4000 mg | ORAL_CAPSULE | Freq: Every evening | ORAL | Status: DC
  Administered 2021-03-20 – 2021-03-22 (×3): 0.4 mg via ORAL
  Filled 2021-03-20 (×3): qty 1

## 2021-03-20 NOTE — ED Provider Notes (Signed)
?[facility] DEPT ?Provider Note ? ? ?CSN: [id number] ?Arrival date & time: 03/20/21  1222 ? ?  ? ?History ? ?No chief complaint on file. ? ? ?Jonathon [name] is a 71 y.o. male. ? ?HPI ?Patient with known history of lung cancer.  CT scan done earlier last month on fortunately showed worsening disease despite chemotherapy.  Started on new chemotherapy on Tuesday.  Now feeling more fatigued.  Nausea without vomiting.  Decreased oral intake.  Found to have new hypoxia though.  Sats of 88% in triage.  No fevers or chills.  Somewhat decreased oral intake. ?  ? ?Home Medications ?Prior to Admission medications   ?Medication Sig Start Date End Date Taking? Authorizing Provider  ?albuterol (PROVENTIL) (2.5 MG/3ML) 0.083% nebulizer solution Take 2.5 mg by nebulization every 4 (four) hours as needed for wheezing or shortness of breath.    [provider]  ?Albuterol Sulfate (PROAIR RESPICLICK) 585 (90 BASE) MCG/ACT AEPB Inhale 2 puffs into the lungs 4 (four) times daily as needed. 02/24/14   Parrett, Fonnie Mu, NP  ?amLODipine (NORVASC) 10 MG tablet TAKE 1 TABLET BY MOUTH ONCE A DAY 02/15/21     ?atorvastatin (LIPITOR) 20 MG tablet Take one tablet by mouth daily. 02/23/21     ?bisoprolol (ZEBETA) 5 MG tablet Take one tablet (5 mg dose) by mouth daily. 11/12/20     ?cefdinir (OMNICEF) 300 MG capsule Take one capsule (300 mg dose) by mouth 2 (two) times daily for 7 days. 02/24/21     ?cloNIDine (CATAPRES) 0.3 MG tablet TAKE 1 TABLET BY MOUTH TWICE DAILY 05/19/20     ?Cyanocobalamin (VITAMIN B 12 PO) Take 1 tablet by mouth every morning.     [provider]  ?cyclobenzaprine (FLEXERIL) 5 MG tablet TAKE 1 TABLET BY MOUTH AT BEDTIME AS NEEDED FOR MUSCLE SPASMS 12/24/20     ?dabigatran (PRADAXA) 150 MG CAPS capsule Take 150 mg by mouth 2 (two) times daily.    [provider]  ?DULoxetine (CYMBALTA) 30 MG capsule Take one capsule (30 mg dose) by mouth daily. 11/12/20     ?gabapentin  (NEURONTIN) 400 MG capsule TAKE 2 CAPSULES BY MOUTH THREE TIMES A DAY 08/31/20     ?losartan (COZAAR) 100 MG tablet TAKE 1 TABLET BY MOUTH DAILY. 01/12/21 01/12/22  Adrian Prows, MD  ?metFORMIN (GLUCOPHAGE) 500 MG tablet TAKE 1 TABLET BY MOUTH ONCE A DAY WITH BREAKFAST 05/19/20     ?nitroGLYCERIN (NITROSTAT) 0.4 MG SL tablet PLACE 1 TABLET UNDER THE TONGUE EVERY 5  MINUTES AS NEEDED AS DIRECTED. 11/16/20 11/16/21  Adrian Prows, MD  ?nortriptyline (PAMELOR) 25 MG capsule TAKE 1 CAPSULE BY MOUTH EVERY NIGHT AT BEDTIME 03/16/21     ?pantoprazole (PROTONIX) 20 MG tablet TAKE 1 TABLET BY MOUTH DAILY 03/15/21 03/15/22  Adrian Prows, MD  ?potassium chloride SA (KLOR-CON M) 20 MEQ tablet Take 1 tablet by mouth once daily. 01/28/21   [name], MD  ?prochlorperazine (COMPAZINE) 10 MG tablet Take 1 tablet (10 mg total) by mouth every 6 (six) hours as needed for nausea or vomiting. 09/26/19   [name], MD  ?[facility] 160-4.5 MCG/ACT inhaler INHALE 2 PUFFS INTO THE LUNGS TWICE DAILY 11/27/19 02/17/21  [name], MD  ?[facility] 160-4.5 MCG/ACT inhaler INHALE 2 PUFFS INTO THE LUNGS TWICE DAILY 11/12/20     ?tamsulosin (FLOMAX) 0.4 MG CAPS capsule Take one capsule (0.4 mg dose) by mouth daily. 11/12/20     ?temazepam (RESTORIL) 15 MG capsule Take  by mouth. 03/02/20   [provider]  ?Tiotropium Bromide Monohydrate (SPIRIVA RESPIMAT) 2.5 MCG/ACT AERS Inhale two puffs into the lungs as needed. 12/03/20     ?   ? ?Allergies    ?Atorvastatin   ? ?Review of Systems   ?Review of Systems  ?Constitutional:  Positive for fatigue. Negative for appetite change and fever.  ?Respiratory:  Positive for shortness of breath.   ?Gastrointestinal:  Positive for nausea. Negative for abdominal pain.  ?Genitourinary:  Negative for flank pain.  ?Musculoskeletal:  Negative for back pain.  ?Neurological:  Negative for weakness.  ? ?Physical Exam ?Updated Vital Signs ?BP 117/71 (BP Location: Left Arm)   Pulse 64   Temp 98.2 ?F (36.8 ?C)  (Oral)   Resp 16   SpO2 99%  ?Physical Exam ?Vitals and nursing note reviewed.  ?HENT:  ?   Head: Atraumatic.  ?Eyes:  ?   Pupils: Pupils are equal, round, and reactive to light.  ?Pulmonary:  ?   Breath sounds: No wheezing or rhonchi.  ?Musculoskeletal:     ?   General: No tenderness.  ?   Cervical back: Neck supple.  ?Skin: ?   Capillary Refill: Capillary refill takes less than 2 seconds.  ?Neurological:  ?   Mental Status: He is oriented to person, place, and time.  ? ? ?ED Results / Procedures / Treatments   ?Labs ?(all labs ordered are listed, but only abnormal results are displayed) ?Labs Reviewed  ?CBC WITH DIFFERENTIAL/PLATELET - Abnormal; Notable for the following components:  ?    Result Value  ? Hemoglobin 11.0 (*)   ? HCT 34.6 (*)   ? RDW 17.1 (*)   ? All other components within normal limits  ?COMPREHENSIVE METABOLIC PANEL - Abnormal; Notable for the following components:  ? Sodium 130 (*)   ? Glucose, Bld 109 (*)   ? BUN 29 (*)   ? Calcium 8.2 (*)   ? Albumin 3.0 (*)   ? All other components within normal limits  ?CULTURE, BLOOD (ROUTINE X 2)  ?CULTURE, BLOOD (ROUTINE X 2)  ?LACTIC ACID, PLASMA  ?URINALYSIS, ROUTINE W REFLEX MICROSCOPIC  ?LACTIC ACID, PLASMA  ? ? ?EKG ?None ? ?Radiology ?DG Chest Portable 1 View ? ?Result Date: 03/20/2021 ?CLINICAL DATA:  Short of breath. Patient is starting a new chemotherapy treatment for carcinoma. EXAM: PORTABLE CHEST 1 VIEW COMPARISON:  09/12/2019.  CT, 03/04/2021. FINDINGS: There is hazy opacity in the mid and lower lungs, right greater than left, more confluent opacity noted in the medial left lung base. This has progressed compared to the prior chest radiograph. Upper lungs are lucent consistent with advanced emphysema, stable. Small ill-defined nodules are vaguely perceived radiographically, better seen on the more recent CT. No definite pleural effusion.  No pneumothorax. Cardiac silhouette is normal in size. No convincing mediastinal or hilar masses.  IMPRESSION: 1. Hazy opacity in the mid to lower lungs, right greater than left, which is in the area of most of the normal residual lung tissue. Findings consistent with acute bilateral infection or inflammation. Additional left lung base opacity is likely atelectasis. 2. Advanced bullous emphysema stable from the prior exams. Electronically Signed   By: Lajean Manes [name]   On: 03/20/2021 13:15   ? ?Procedures ?Procedures  ? ? ?Medications Ordered in ED ?Medications  ?cefTRIAXone (ROCEPHIN) 1 g in sodium chloride 0.9 % 100 mL IVPB (has no administration in time range)  ?azithromycin (ZITHROMAX) 500 mg in sodium chloride 0.9 %  250 mL IVPB (500 mg Intravenous New Bag/Given 03/20/21 1355)  ?lactated ringers bolus 1,000 mL (1,000 mLs Intravenous New Bag/Given 03/20/21 1315)  ? ? ?ED Course/ Medical Decision Making/ A&P ?  ?                        ?Medical Decision Making ?Problems Addressed: ?Community acquired pneumonia, unspecified laterality: acute illness or injury that poses a threat to life or bodily functions ? ?Amount and/or Complexity of Data Reviewed ?Labs: ordered. ?Radiology: ordered. ? ?Risk ?Decision regarding hospitalization. ? ? ?Patient with shortness of breath.  Recent started new chemotherapy.  History of lung cancer.  I reviewed recent oncology notes.  Hypoxic upon arrival.  Doing well on 3 L nasal cannula at this point however.  Lab work reviewed and hemoglobin slightly decreased but white count normal.  Normal lactic acid.  Chest x-ray also independently interpreted and showed infiltrate particularly at right base.  Likely pneumonia.  With hypoxia due to pneumonia will admit to hospitalist.  Doubt other pathology such as pulmonary embolism causing this x-ray pattern.  Does not appear septic at this time.  Will admit to hospitalist ? ? ? ? ? ? ? ?Final Clinical Impression(s) / ED Diagnoses ?Final diagnoses:  ?Community acquired pneumonia, unspecified laterality  ?Malignant neoplasm of lung, unspecified  laterality, unspecified part of lung ([place])  ? ? ?Rx / DC Orders ?ED Discharge Orders   ? ? None  ? ?  ? ? ?  ?Davonna Belling, MD ?03/20/21 1427 ? ?

## 2021-03-20 NOTE — ED Triage Notes (Signed)
Reports SOB and generalized malaise after starting a new chemo treatment on Tuesday. O2 88% RA, 3 L [place] applied in triage.  ?

## 2021-03-20 NOTE — H&P (Signed)
History and Physical    Patient: Jonathon [name]:[id number] DOB: 1950-02-05 DOA: 03/20/2021 DOS: the patient was seen and examined on 03/20/2021 PCP: [name], MD  Patient coming from: Home  Chief Complaint: No chief complaint on file.   HPI: Jonathon [name] is a 71 y.o. male with medical history significant of small cell lung CA s/p RUL lobectomy, HTN, HLD, previous CVA, CAD. Presenting with weakness and shortness of breath. He reports that he had chemotherapy earlier this week. Since then he had felt increasingly fatigued and short of breath. He has been nauseous, but no vomiting. He has not had any sick contacts. He has not tried any medications other than APAP to help. This morning, he felt exceedingly short of breath. He called his onco team, and they recommended that he come to the ED for evaluation. He denies any other aggravating or alleviating factors.   Review of Systems: As mentioned in the history of present illness. All other systems reviewed and are negative. Past Medical History:  Diagnosis Date   Cancer ([place])    CHF (congestive heart failure) (HCC)    Chronic cough    COPD, severe (HCC)    PT DENIES REGULAR DAILY INHALER ANY PRN   Coronary artery disease    CVA (cerebral vascular accident) ([place]) 02/22/2011   LEFT BRAIN STEM PONTINE HEMORRHAGE--  RIGHT SIDED WEAKNESS   Diabetes mellitus without complication (HCC)    type 2   Dyspnea    GERD (gastroesophageal reflux disease)    Harsh voice quality    PT STATES NORMAL FOR HIM   High cholesterol    History of CHF (congestive heart failure) MONITORED BY DR Coletta Memos   DIASTOLIC   History of radiation therapy 03/13/19-03/20/19   SBRT Right Lung; Dr. Gery Pray   History of radiation therapy 02/17/20-02/28/20   Whole brain radiation; Dr. Gery Pray   History of radiation therapy 05/25/2020   05/18/2020-05/25/2020  SBRT Right Lung; Dr. Gery Pray   Hydrocele, left    Hypertension    NSTEMI (non-ST  elevated myocardial infarction) ([place]) 12/2015   Pre-operative clearance    GIVEN BY DR Coletta Memos   Short of breath on exertion    Smoker    Weakness of right side of body    S/P CVA   FEB 2013   Past Surgical History:  Procedure Laterality Date   ABDOMINAL ANGIOGRAM  09/04/2012   Procedure: ABDOMINAL ANGIOGRAM;  Surgeon: Laverda Page, MD;  Location: [facility] CATH LAB;  Service: Cardiovascular;;   CARDIAC CATHETERIZATION  05-02-2011  DR Johnsie Cancel   MILD NONOBSTRUCTIVE CAD/ LAD, OM , AND D1/ EF 60^   CARDIAC CATHETERIZATION  AUG 2000   NON-OBSTRUTIVE CAD/ EF 68%/ 25% PROXIMAL LAD/ 20% MID CIRCUMFLEX   CARDIAC CATHETERIZATION [name] 12/23/2015   Procedure: Left Heart Cath and Coronary Angiography;  Surgeon: Adrian Prows, MD;  Location: [place] CV LAB;  Service: Cardiovascular;  Laterality: [name];   CARDIAC CATHETERIZATION [name] 12/24/2015   Procedure: Coronary Stent Intervention;  Surgeon: Adrian Prows, MD;  Location: [place] CV LAB;  Service: Cardiovascular;  Laterality: [name];   CORONARY ANGIOPLASTY     CORONARY STENT PLACEMENT  12/24/2015    PTCA and stenting of the distal RCA    HYDROCELE EXCISION Left 04/24/2012   Procedure: HYDROCELECTOMY ADULT;  Surgeon: Fredricka Bonine, MD;  Location: [facility];  Service: Urology;  Laterality: Left;  90 mins req for this case      INGUINAL  HERNIA REPAIR  1954   LEFT AND RIGHT HEART CATHETERIZATION WITH CORONARY ANGIOGRAM [name] 09/04/2012   Procedure: LEFT AND RIGHT HEART CATHETERIZATION WITH CORONARY ANGIOGRAM;  Surgeon: Laverda Page, MD;  Location: [facility] CATH LAB;  Service: Cardiovascular;  Laterality: [name];   LEFT HEART CATHETERIZATION WITH CORONARY ANGIOGRAM [name] 05/02/2011   Procedure: LEFT HEART CATHETERIZATION WITH CORONARY ANGIOGRAM;  Surgeon: Josue Hector, MD;  Location: [facility] CATH LAB;  Service: Cardiovascular;  Laterality: [name];   THORACOTOMY/LOBECTOMY Right 03-06-2008   AND STAPLING OF BLEBS FOR SPONTANOUS PNEUMOTHORAX    TRANSTHORACIC ECHOCARDIOGRAM  02-17-2011   MODERATE LVH/ LVSF NORMAL/ EF 71-24%/ GRADE I DIASTOLIC DYSFUNCTION   Social History:  reports that he has been smoking cigarettes. He has a 13.25 pack-year smoking history. He has never used smokeless tobacco. He reports that he does not currently use alcohol. He reports that he does not use drugs.  Allergies  Allergen Reactions   Atorvastatin Other (See Comments)    Severe constipation    Family History  Problem Relation Age of Onset   Hypertension Mother    Stroke Mother 19   Lung disease Father 72       died of "colapsed lung" per patient   Asthma Father    Diabetes Sister    Colon cancer Neg Hx    Stomach cancer Neg Hx     Prior to Admission medications   Medication Sig Start Date End Date Taking? Authorizing Provider  albuterol (PROVENTIL) (2.5 MG/3ML) 0.083% nebulizer solution Take 2.5 mg by nebulization every 4 (four) hours as needed for wheezing or shortness of breath.   Yes [provider]  Albuterol Sulfate (PROAIR RESPICLICK) 580 (90 BASE) MCG/ACT AEPB Inhale 2 puffs into the lungs 4 (four) times daily as needed. Patient taking differently: Inhale 2 puffs into the lungs 4 (four) times daily as needed (wheezing). 02/24/14  Yes Parrett, Tammy S, NP  amLODipine (NORVASC) 10 MG tablet TAKE 1 TABLET BY MOUTH ONCE A DAY Patient taking differently: Take 10 mg by mouth every evening. 02/15/21  Yes   atorvastatin (LIPITOR) 20 MG tablet Take one tablet by mouth daily. Patient taking differently: Take 20 mg by mouth daily. 02/23/21  Yes   bisoprolol (ZEBETA) 5 MG tablet Take one tablet (5 mg dose) by mouth daily. Patient taking differently: Take 5 mg by mouth every evening. 11/12/20  Yes   cloNIDine (CATAPRES) 0.3 MG tablet TAKE 1 TABLET BY MOUTH TWICE DAILY 05/19/20  Yes   Cyanocobalamin (VITAMIN B 12 PO) Take 1 tablet by mouth every morning.    Yes [provider]  cyclobenzaprine (FLEXERIL) 5 MG tablet TAKE 1 TABLET BY  MOUTH AT BEDTIME AS NEEDED FOR MUSCLE SPASMS Patient taking differently: Take 5 mg by mouth at bedtime. 12/24/20  Yes   DULoxetine (CYMBALTA) 30 MG capsule Take one capsule (30 mg dose) by mouth daily. Patient taking differently: Take 30 mg by mouth at bedtime. 11/12/20  Yes   gabapentin (NEURONTIN) 400 MG capsule TAKE 2 CAPSULES BY MOUTH THREE TIMES A DAY Patient taking differently: Take 1,200 mg by mouth 2 (two) times daily. 08/31/20  Yes   losartan (COZAAR) 100 MG tablet TAKE 1 TABLET BY MOUTH DAILY. Patient taking differently: Take 100 mg by mouth every evening. 01/12/21 01/12/22 Yes Adrian Prows, MD  metFORMIN (GLUCOPHAGE) 500 MG tablet TAKE 1 TABLET BY MOUTH ONCE A DAY WITH BREAKFAST Patient taking differently: Take 500 mg by mouth daily with breakfast. 05/19/20  Yes  nitroGLYCERIN (NITROSTAT) 0.4 MG SL tablet PLACE 1 TABLET UNDER THE TONGUE EVERY 5  MINUTES AS NEEDED AS DIRECTED. Patient taking differently: 0.4 mg every 5 (five) minutes as needed for chest pain. 11/16/20 11/16/21 Yes Adrian Prows, MD  nortriptyline (PAMELOR) 25 MG capsule TAKE 1 CAPSULE BY MOUTH EVERY NIGHT AT BEDTIME 03/16/21  Yes   pantoprazole (PROTONIX) 20 MG tablet TAKE 1 TABLET BY MOUTH DAILY Patient taking differently: Take 20 mg by mouth every evening. 03/15/21 03/15/22 Yes Adrian Prows, MD  potassium chloride SA (KLOR-CON M) 20 MEQ tablet Take 1 tablet by mouth once daily. Patient taking differently: Take 20 mEq by mouth every evening. 01/28/21  Yes [name], MD  SYMBICORT 160-4.5 MCG/ACT inhaler INHALE 2 PUFFS INTO THE LUNGS TWICE DAILY 11/12/20  Yes   tamsulosin (FLOMAX) 0.4 MG CAPS capsule Take one capsule (0.4 mg dose) by mouth daily. Patient taking differently: Take 0.4 mg by mouth every evening. 11/12/20  Yes   temazepam (RESTORIL) 15 MG capsule Take 15 mg by mouth at bedtime. 03/02/20  Yes [provider]  Tiotropium Bromide Monohydrate (SPIRIVA RESPIMAT) 2.5 MCG/ACT AERS Inhale two puffs into the lungs  as needed. Patient taking differently: Inhale 1 puff into the lungs 2 (two) times daily. 12/03/20  Yes   cefdinir (OMNICEF) 300 MG capsule Take one capsule (300 mg dose) by mouth 2 (two) times daily for 7 days. Patient not taking: Reported on 03/20/2021 02/24/21     prochlorperazine (COMPAZINE) 10 MG tablet Take 1 tablet (10 mg total) by mouth every 6 (six) hours as needed for nausea or vomiting. Patient not taking: Reported on 03/20/2021 09/26/19   [name], MD  [facility] 160-4.5 MCG/ACT inhaler INHALE 2 PUFFS INTO THE LUNGS TWICE DAILY Patient not taking: Reported on 03/20/2021 11/27/19 02/17/21  [name], MD    Physical Exam: Vitals:   03/20/21 1232 03/20/21 1259 03/20/21 1355 03/20/21 1507  BP: 115/61  117/71 120/73  Pulse: 73  64 67  Resp: (!) 22  16 14   Temp: 98.2 F (36.8 C)     TempSrc: Oral     SpO2: (!) 87% 93% 99% 96%   General: 71 y.o. male resting in bed in NAD Eyes: PERRL, normal sclera ENMT: Nares patent w/o discharge, orophaynx clear, dentition normal, ears w/o discharge/lesions/ulcers Neck: Supple, trachea midline Cardiovascular: RRR, +S1, S2, no m/g/r, equal pulses throughout Respiratory: absent RUL, decreased at bases, soft rhonchi on right, increased WOB GI: BS+, NDNT, no masses noted, no organomegaly noted MSK: No e/c/c Neuro: A&O x 3, no focal deficits Psyc: Appropriate interaction and affect, calm/cooperative  Data Reviewed:  Na+ 130 BUN 29 Hgb 11.0 WBC 4.8  CXR: 1. Hazy opacity in the mid to lower lungs, right greater than left, which is in the area of most of the normal residual lung tissue. Findings consistent with acute bilateral infection or inflammation. Additional left lung base opacity is likely atelectasis. 2. Advanced bullous emphysema stable from the prior exams.  Assessment and Plan: No notes have been filed under this hospital service. Service: Hospitalist Multifocal PNA     - place in obs, med-surg     - continue rocephin, azithro      - check urine legionella, strep; check MRSA     - nebs, guaifenesin     - IS  Small cell lung CA on palliative chemo     - onco added to care team; continue outpt follow up  CAD HTN HLD     -  continue home regimen  COPD     - continue home inhalers  Hyponatremia     - mild, he's a little dry, give fluids  DM2     - A1c, SSI, glucose checks, DM diet  Constipation     - bowel regimen  Advance Care Planning:   Code Status: FULL  Consults: None  Family Communication: None at bedside  Severity of Illness: The appropriate patient status for this patient is OBSERVATION. Observation status is judged to be reasonable and necessary in order to provide the required intensity of service to ensure the patient's safety. The patient's presenting symptoms, physical exam findings, and initial radiographic and laboratory data in the context of their medical condition is felt to place them at decreased risk for further clinical deterioration. Furthermore, it is anticipated that the patient will be medically stable for discharge from the hospital within 2 midnights of admission.   Author: Jonnie Finner, DO 03/20/2021 3:34 PM  For on call review [email or web address].

## 2021-03-20 NOTE — Plan of Care (Signed)
  Problem: Activity: Goal: Ability to tolerate increased activity will improve Outcome: Progressing   Problem: Clinical Measurements: Goal: Ability to maintain a body temperature in the normal range will improve Outcome: Progressing   Problem: Respiratory: Goal: Ability to maintain adequate ventilation will improve Outcome: Progressing Goal: Ability to maintain a clear airway will improve Outcome: Progressing   

## 2021-03-21 DIAGNOSIS — Z8673 Personal history of transient ischemic attack (TIA), and cerebral infarction without residual deficits: Secondary | ICD-10-CM | POA: Diagnosis not present

## 2021-03-21 DIAGNOSIS — Z20822 Contact with and (suspected) exposure to covid-19: Secondary | ICD-10-CM | POA: Diagnosis present

## 2021-03-21 DIAGNOSIS — Z79899 Other long term (current) drug therapy: Secondary | ICD-10-CM | POA: Diagnosis not present

## 2021-03-21 DIAGNOSIS — J189 Pneumonia, unspecified organism: Secondary | ICD-10-CM | POA: Diagnosis present

## 2021-03-21 DIAGNOSIS — C3491 Malignant neoplasm of unspecified part of right bronchus or lung: Secondary | ICD-10-CM

## 2021-03-21 DIAGNOSIS — C771 Secondary and unspecified malignant neoplasm of intrathoracic lymph nodes: Secondary | ICD-10-CM | POA: Diagnosis present

## 2021-03-21 DIAGNOSIS — I110 Hypertensive heart disease with heart failure: Secondary | ICD-10-CM | POA: Diagnosis present

## 2021-03-21 DIAGNOSIS — F17210 Nicotine dependence, cigarettes, uncomplicated: Secondary | ICD-10-CM | POA: Diagnosis present

## 2021-03-21 DIAGNOSIS — K5900 Constipation, unspecified: Secondary | ICD-10-CM | POA: Diagnosis present

## 2021-03-21 DIAGNOSIS — I252 Old myocardial infarction: Secondary | ICD-10-CM | POA: Diagnosis not present

## 2021-03-21 DIAGNOSIS — I5032 Chronic diastolic (congestive) heart failure: Secondary | ICD-10-CM | POA: Diagnosis present

## 2021-03-21 DIAGNOSIS — E119 Type 2 diabetes mellitus without complications: Secondary | ICD-10-CM | POA: Diagnosis present

## 2021-03-21 DIAGNOSIS — E7800 Pure hypercholesterolemia, unspecified: Secondary | ICD-10-CM | POA: Diagnosis present

## 2021-03-21 DIAGNOSIS — Z825 Family history of asthma and other chronic lower respiratory diseases: Secondary | ICD-10-CM | POA: Diagnosis not present

## 2021-03-21 DIAGNOSIS — K219 Gastro-esophageal reflux disease without esophagitis: Secondary | ICD-10-CM | POA: Diagnosis present

## 2021-03-21 DIAGNOSIS — Z902 Acquired absence of lung [part of]: Secondary | ICD-10-CM | POA: Diagnosis not present

## 2021-03-21 DIAGNOSIS — Z923 Personal history of irradiation: Secondary | ICD-10-CM | POA: Diagnosis not present

## 2021-03-21 DIAGNOSIS — J9601 Acute respiratory failure with hypoxia: Secondary | ICD-10-CM | POA: Diagnosis present

## 2021-03-21 DIAGNOSIS — E871 Hypo-osmolality and hyponatremia: Secondary | ICD-10-CM | POA: Diagnosis present

## 2021-03-21 DIAGNOSIS — J439 Emphysema, unspecified: Secondary | ICD-10-CM

## 2021-03-21 DIAGNOSIS — Z955 Presence of coronary angioplasty implant and graft: Secondary | ICD-10-CM | POA: Diagnosis not present

## 2021-03-21 DIAGNOSIS — Z888 Allergy status to other drugs, medicaments and biological substances status: Secondary | ICD-10-CM | POA: Diagnosis not present

## 2021-03-21 DIAGNOSIS — Z8249 Family history of ischemic heart disease and other diseases of the circulatory system: Secondary | ICD-10-CM | POA: Diagnosis not present

## 2021-03-21 DIAGNOSIS — I2510 Atherosclerotic heart disease of native coronary artery without angina pectoris: Secondary | ICD-10-CM | POA: Diagnosis present

## 2021-03-21 DIAGNOSIS — C3431 Malignant neoplasm of lower lobe, right bronchus or lung: Secondary | ICD-10-CM | POA: Diagnosis present

## 2021-03-21 LAB — COMPREHENSIVE METABOLIC PANEL
ALT: 35 U/L (ref 0–44)
AST: 38 U/L (ref 15–41)
Albumin: 2.7 g/dL — ABNORMAL LOW (ref 3.5–5.0)
Alkaline Phosphatase: 54 U/L (ref 38–126)
Anion gap: 6 (ref 5–15)
BUN: 18 mg/dL (ref 8–23)
CO2: 23 mmol/L (ref 22–32)
Calcium: 7.7 mg/dL — ABNORMAL LOW (ref 8.9–10.3)
Chloride: 102 mmol/L (ref 98–111)
Creatinine, Ser: 0.96 mg/dL (ref 0.61–1.24)
GFR, Estimated: >60 mL/min (ref >60)
Glucose, Bld: 132 mg/dL — ABNORMAL HIGH (ref 70–99)
Potassium: 4.1 mmol/L (ref 3.5–5.1)
Sodium: 131 mmol/L — ABNORMAL LOW (ref 135–145)
Total Bilirubin: 1.2 mg/dL (ref 0.3–1.2)
Total Protein: 6.5 g/dL (ref 6.5–8.1)

## 2021-03-21 LAB — CBC
HCT: 31 % — ABNORMAL LOW (ref 39.0–52.0)
Hemoglobin: 10 g/dL — ABNORMAL LOW (ref 13.0–17.0)
MCH: 26.2 pg (ref 26.0–34.0)
MCHC: 32.3 g/dL (ref 30.0–36.0)
MCV: 81.4 fL (ref 80.0–100.0)
Platelets: 179 10*3/uL (ref 150–400)
RBC: 3.81 MIL/uL — ABNORMAL LOW (ref 4.22–5.81)
RDW: 17.2 % — ABNORMAL HIGH (ref 11.5–15.5)
WBC: 4.5 10*3/uL (ref 4.0–10.5)
nRBC: 0 % (ref 0.0–0.2)

## 2021-03-21 LAB — GLUCOSE, CAPILLARY
Glucose-Capillary: 134 mg/dL — ABNORMAL HIGH (ref 70–99)
Glucose-Capillary: 154 mg/dL — ABNORMAL HIGH (ref 70–99)
Glucose-Capillary: 213 mg/dL — ABNORMAL HIGH (ref 70–99)
Glucose-Capillary: 135 mg/dL — ABNORMAL HIGH (ref 70–99)

## 2021-03-21 LAB — HEMOGLOBIN A1C
Hgb A1c MFr Bld: 6.6 % — ABNORMAL HIGH (ref 4.8–5.6)
Mean Plasma Glucose: 142.72 mg/dL

## 2021-03-21 LAB — STREP PNEUMONIAE URINARY ANTIGEN: Strep Pneumo Urinary Antigen: NEGATIVE

## 2021-03-21 NOTE — Assessment & Plan Note (Signed)
--  mild, asymptomatic, monitor ?

## 2021-03-21 NOTE — Progress Notes (Signed)
?  Progress Note ? ? ?Patient: Jonathon [name] GQQ:[id number] DOB: September 26, 1950 DOA: 03/20/2021     0 ?DOS: the patient was seen and examined on 03/21/2021 ?  ?Brief hospital course: ?71 year old man PMH small cell lung cancer status post right upper lobe ectomy, currently on chemotherapy who developed fatigue and shortness of breath after last treatment he was admitted for treatment for pneumonia and acute hypoxic respiratory failure. ? ?Assessment and Plan: ?* PNA (pneumonia) ?--complicated by recent chemotherapy for small cell lung cancer ?--still quite SOB and requiring oxygen ?--continue antibiotics ? ?Acute respiratory failure with hypoxia ([place]) ?--secondary to pneumonia, complicated by lung cancer and bullous emphysema ? ?Primary lung small cell carcinoma, right (HCC) ?--followed by Dr. Julien Nordmann, added to treatment team ? ?COPD (chronic obstructive pulmonary disease) ([place]) ?--appears stable, continue bronchodilators, azithromycin ? ?Hyponatremia ?--mild, asymptomatic, monitor ? ?Type 2 diabetes mellitus without complication, without long-term current use of insulin ([place]) ?--stable. Continue SSI. Resume metformin on discharge. ? ? ? ? ? ? ? ?Subjective:  ?Feels a little better but still SOB ? ?Physical Exam: ?Vitals:  ? 03/20/21 2036 03/21/21 0151 03/21/21 0805 03/21/21 6712  ?BP: 137/72 119/72    ?Pulse: 84 71    ?Resp: 20 16    ?Temp: 98 ?F (36.7 ?C) 98.4 ?F (36.9 ?C)    ?TempSrc: Oral Oral    ?SpO2: 98% 95% 95% 95%  ? ?Physical Exam ?Vitals reviewed.  ?Constitutional:   ?   General: He is not in acute distress. ?   Appearance: He is not ill-appearing or toxic-appearing.  ?Cardiovascular:  ?   Rate and Rhythm: Normal rate and regular rhythm.  ?   Heart sounds: No murmur heard. ?Pulmonary:  ?   Effort: No respiratory distress.  ?   Breath sounds: No wheezing, rhonchi or rales.  ?   Comments: Moderate increased respiratory effort, speaks in short sentences, easily fatigued.  ?Neurological:  ?   Mental Status: He is  alert.  ?Psychiatric:     ?   Mood and Affect: Mood normal.     ?   Behavior: Behavior normal.  ? ? ? ?Data Reviewed: ? ?CBG stable ?Na+ 131, stable, CMP noted ?Hgb stable 10.0 ?CXR w/ bilateral infection or inflammation ? ?Family Communication: wife at bedside ? ?Disposition: ?Status is: Inpatient ?Remains inpatient appropriate because: pneumonia, acute hypoxic respiratory failure ? ? ? ? ? ? ? ? ? Planned Discharge Destination: Home ? ? ? ? ?Time spent: 25 minutes ? ?Author: ?Murray Hodgkins, MD ?03/21/2021 1:53 PM ? ?For on call review [email or web address].  ? ?

## 2021-03-21 NOTE — Assessment & Plan Note (Signed)
--  followed by Dr. Julien Nordmann, added to treatment team ?

## 2021-03-21 NOTE — Assessment & Plan Note (Addendum)
--  complicated by recent chemotherapy for small cell lung cancer ?--still quite SOB and requiring oxygen, slow to improve ?--continue antibiotics ?

## 2021-03-21 NOTE — Progress Notes (Addendum)
Patient Saturations on Room Air at Rest = 96% ? ?Patient Saturations on Room Air while Ambulating = 85% ? ?Patient required 2 Liters of oxygen to recover at rest = 100%  ? ?Pt unable to tolerate further ambulation at this time due to increased SOB. ?

## 2021-03-21 NOTE — Plan of Care (Signed)
  Problem: Activity: Goal: Ability to tolerate increased activity will improve Outcome: Progressing   Problem: Clinical Measurements: Goal: Ability to maintain a body temperature in the normal range will improve Outcome: Progressing   Problem: Respiratory: Goal: Ability to maintain adequate ventilation will improve Outcome: Progressing Goal: Ability to maintain a clear airway will improve Outcome: Progressing   

## 2021-03-21 NOTE — Assessment & Plan Note (Signed)
--  appears stable, continue bronchodilators, azithromycin ?

## 2021-03-21 NOTE — Assessment & Plan Note (Signed)
--  stable. Continue SSI. Resume metformin on discharge. ?

## 2021-03-21 NOTE — Hospital Course (Addendum)
71 year old man PMH small cell lung cancer status post right upper lobe ectomy, currently on chemotherapy who developed fatigue and shortness of breath after last treatment he was admitted for treatment for pneumonia and acute hypoxic respiratory failure. ?--3/6 feels better but not back to baseline, remains fatigued and SOB. Will need home oxygen. Hopefully home 1-2 days if improving. ?

## 2021-03-21 NOTE — Assessment & Plan Note (Signed)
--  secondary to pneumonia, complicated by lung cancer and bullous emphysema ?

## 2021-03-22 ENCOUNTER — Telehealth: Payer: Self-pay | Admitting: Medical Oncology

## 2021-03-22 ENCOUNTER — Inpatient Hospital Stay: Payer: Medicare Other

## 2021-03-22 LAB — GLUCOSE, CAPILLARY
Glucose-Capillary: 127 mg/dL — ABNORMAL HIGH (ref 70–99)
Glucose-Capillary: 139 mg/dL — ABNORMAL HIGH (ref 70–99)
Glucose-Capillary: 104 mg/dL — ABNORMAL HIGH (ref 70–99)
Glucose-Capillary: 172 mg/dL — ABNORMAL HIGH (ref 70–99)

## 2021-03-22 NOTE — TOC Transition Note (Signed)
Transition of Care (TOC) - CM/SW Discharge Note ? ? ?Patient Details  ?Name: Jonathon [name] ?MRN: [id number] ?Date of Birth: 1950-09-29 ? ?Transition of Care (TOC) CM/SW Contact:  ?Trish Mage, LCSW ?Phone Number: ?03/22/2021, 12:47 PM ? ? ?Clinical Narrative:   Patient who is stable for d/c is in need of home O2.  SAT note and orders seen and appreciated.  Contacted Caryl Pina with [employer] who will arrange for delivery of home unit and travel cannister. No further needs identified. TOC sign off. ? ? ? ?Final next level of care: Home/Self Care ?Barriers to Discharge: No Barriers Identified ? ? ?Patient Goals and CMS Choice ?  ?  ?  ? ?Discharge Placement ?  ?           ?  ?  ?  ?  ? ?Discharge Plan and Services ?  ?  ?           ?  ?  ?  ?  ?  ?  ?  ?  ?  ?  ? ?Social Determinants of Health (SDOH) Interventions ?  ? ? ?Readmission Risk Interventions ?No flowsheet data found. ? ? ? ? ?

## 2021-03-22 NOTE — Telephone Encounter (Signed)
?  Admitted-03/04- Pt was admitted to [facility]-with Pneumonia.  ?

## 2021-03-22 NOTE — Progress Notes (Signed)
?  Progress Note ? ? ?Patient: Jonathon [name] QIW:[id number] DOB: 04-27-1950 DOA: 03/20/2021     1 ?DOS: the patient was seen and examined on 03/22/2021 ?  ?Brief hospital course: ?71 year old man PMH small cell lung cancer status post right upper lobe ectomy, currently on chemotherapy who developed fatigue and shortness of breath after last treatment he was admitted for treatment for pneumonia and acute hypoxic respiratory failure. ?--3/6 feels better but not back to baseline, remains fatigued and SOB. Will need home oxygen. Hopefully home 1-2 days if improving. ? ?Assessment and Plan: ?* PNA (pneumonia) ?--complicated by recent chemotherapy for small cell lung cancer ?--still quite SOB and requiring oxygen, slow to improve ?--continue antibiotics ? ?Acute respiratory failure with hypoxia ([place]) ?--secondary to pneumonia, complicated by lung cancer and bullous emphysema ? ?Primary lung small cell carcinoma, right (HCC) ?--followed by Dr. Julien Nordmann, added to treatment team ? ?COPD (chronic obstructive pulmonary disease) ([place]) ?--appears stable, continue bronchodilators, azithromycin ? ?Hyponatremia ?--mild, asymptomatic, monitor ? ?Type 2 diabetes mellitus without complication, without long-term current use of insulin ([place]) ?--stable. Continue SSI. Resume metformin on discharge. ? ? ? ? ?  ? ?Subjective:  ?Feels better but still SOB and not back to baseline ?Ambulated yesterday w/ SOB and fatigue and hypoxia ? ?Physical Exam: ?Vitals:  ? 03/21/21 1940 03/21/21 2038 03/21/21 2106 03/22/21 0458  ?BP:  127/66  127/81  ?Pulse:  80  76  ?Resp:  16  20  ?Temp:  97.9 ?F (36.6 ?C)  97.9 ?F (36.6 ?C)  ?TempSrc:  Oral  Oral  ?SpO2: 98% 95% 96% 98%  ? ?Physical Exam ?Vitals reviewed.  ?Constitutional:   ?   General: He is not in acute distress. ?   Appearance: He is not ill-appearing or toxic-appearing.  ?Cardiovascular:  ?   Rate and Rhythm: Normal rate and regular rhythm.  ?   Heart sounds: No murmur heard. ?Pulmonary:  ?    Effort: No respiratory distress.  ?   Breath sounds: No wheezing, rhonchi or rales.  ?   Comments: Moderate increased WOB, speaks in short sentences, is quickly fatigued ?Neurological:  ?   Mental Status: He is alert.  ?Psychiatric:     ?   Mood and Affect: Mood normal.     ?   Behavior: Behavior normal.  ? ? ?Data Reviewed: ? ?CBG stable ? ?Family Communication: wife at bedside ? ?Disposition: ?Status is: Inpatient ?Remains inpatient appropriate because: hypoxia, pneumonia, complicated by underlying lung cancer ? ? Planned Discharge Destination: Home ? ? ? ?Time spent: 20 minutes ? ?Author: ?Murray Hodgkins, MD ?03/22/2021 12:35 PM ? ?For on call review [email or web address].  ?

## 2021-03-22 NOTE — Progress Notes (Signed)
?   03/22/21 1424  ?Oxygen Therapy  ?SpO2 94 %  ?O2 Device Room Air  ?Patient Activity (if Appropriate) Ambulating  ?Mobility  ?Activity Ambulated with assistance in hallway  ?Level of Assistance Contact guard assist, steadying assist  ?Assistive Device Front wheel walker  ?Distance Ambulated (ft) 150 ft  ?Activity Response Tolerated well  ?$Mobility charge 1 Mobility  ? ?Pt agreeable to mobilize this afternoon. He refused to use O2 when ambulating. Ambulated about 179ft in hall with RW, tolerated well. No complaints. Pt began wheezing slightly. Had NT grab rolling recliner. Left pt in chair, call bell at side. RN/NT notified of session.  ? ?

## 2021-03-23 ENCOUNTER — Encounter (HOSPITAL_COMMUNITY): Payer: Self-pay | Admitting: Family Medicine

## 2021-03-23 ENCOUNTER — Other Ambulatory Visit (HOSPITAL_COMMUNITY): Payer: Self-pay

## 2021-03-23 LAB — LEGIONELLA PNEUMOPHILA SEROGP 1 UR AG: L. pneumophila Serogp 1 Ur Ag: NEGATIVE

## 2021-03-23 LAB — GLUCOSE, CAPILLARY: Glucose-Capillary: 127 mg/dL — ABNORMAL HIGH (ref 70–99)

## 2021-03-23 MED ORDER — SENNA 8.6 MG PO TABS: 1.0000 | ORAL_TABLET | Freq: Every evening | ORAL | 0 refills | Status: AC | PRN

## 2021-03-23 MED ORDER — GABAPENTIN 400 MG PO CAPS: 1200.0000 mg | ORAL_CAPSULE | Freq: Two times a day (BID) | ORAL | Status: DC

## 2021-03-23 MED ORDER — CEFDINIR 300 MG PO CAPS
300.0000 mg | ORAL_CAPSULE | Freq: Two times a day (BID) | ORAL | 0 refills | Status: AC
  Filled 2021-03-23: qty 8, 4d supply, fill #0

## 2021-03-23 MED ORDER — CEFDINIR 300 MG PO CAPS
300.0000 mg | ORAL_CAPSULE | Freq: Two times a day (BID) | ORAL | Status: DC
  Administered 2021-03-23: 300 mg via ORAL
  Filled 2021-03-23: qty 1

## 2021-03-23 MED ORDER — POLYETHYLENE GLYCOL 3350 17 G PO PACK: 17.0000 g | PACK | Freq: Every day | ORAL | Status: AC | PRN

## 2021-03-23 MED ORDER — AZITHROMYCIN 500 MG PO TABS
ORAL_TABLET | ORAL | 0 refills | Status: DC
  Filled 2021-03-23: qty 2, 2d supply, fill #0

## 2021-03-23 NOTE — Discharge Summary (Signed)
Physician Discharge Summary   Patient: Jonathon [name] MRN: [id number] DOB: 1950/09/22  Admit date:     03/20/2021  Discharge date: 03/23/21  Discharge Physician: Murray Hodgkins   PCP: [name], MD   Recommendations at discharge:   Resolution of pneumonia Ongoing care for lung cancer Discharged on new start home oxygen Mild hyponatremia  Discharge Diagnoses: Principal Problem:   PNA (pneumonia) Active Problems:   COPD (chronic obstructive pulmonary disease) ([place])   Primary lung small cell carcinoma, right ([place])   Acute respiratory failure with hypoxia ([place])   Type 2 diabetes mellitus without complication, without long-term current use of insulin ([place])   Hyponatremia  Hospital Course: 71 year old man PMH small cell lung cancer status post right upper lobe ectomy, currently on chemotherapy who developed fatigue and shortness of breath after last treatment he was admitted for treatment for pneumonia and acute hypoxic respiratory failure. Treated with empiric antibiotics with fairly rapid clinical improvement.  Does require oxygen, discharged home in good condition on oxygen.  Hospitalization was uncomplicated.  * Pneumonia --complicated by recent chemotherapy for small cell lung cancer --much improved --continue antibiotics on discharge  Acute respiratory failure with hypoxia ([facility]) --secondary to pneumonia, complicated by lung cancer and bullous emphysema --discharge home on oxygen  Primary lung small cell carcinoma, right ([place]) --followed by Dr. Julien Nordmann, added to treatment team  COPD (chronic obstructive pulmonary disease) ([place]) --appears stable, continue bronchodilators  Hyponatremia --mild, asymptomatic, monitor as outpatient  Type 2 diabetes mellitus without complication, without long-term current use of insulin ([place]) --stable. Continue SSI. Resume metformin on discharge.        Consultants: none Procedures performed: none  Disposition: Home Diet  recommendation:  Discharge Diet Orders (From admission, onward)     Start     Ordered   03/23/21 0000  Diet general        03/23/21 0950           Carb modified diet DISCHARGE MEDICATION: Allergies as of 03/23/2021       Reactions   Pork-derived Products Other (See Comments)   Muslim    Atorvastatin Other (See Comments)   Severe constipation        Medication List     TAKE these medications    albuterol (2.5 MG/3ML) 0.083% nebulizer solution Commonly known as: PROVENTIL Take 2.5 mg by nebulization every 4 (four) hours as needed for wheezing or shortness of breath. What changed: Another medication with the same name was changed. Make sure you understand how and when to take each.   Albuterol Sulfate 108 (90 Base) MCG/ACT Aepb Commonly known as: ProAir RespiClick Inhale 2 puffs into the lungs 4 (four) times daily as needed. What changed: reasons to take this   amLODipine 10 MG tablet Commonly known as: NORVASC TAKE 1 TABLET BY MOUTH ONCE A DAY What changed: when to take this   atorvastatin 20 MG tablet Commonly known as: LIPITOR Take one tablet by mouth daily. What changed:  how much to take how to take this when to take this   azithromycin 500 MG tablet Commonly known as: ZITHROMAX Take 1 tablet by mouth once a day.  Start in evening 03/23/21.   bisoprolol 5 MG tablet Commonly known as: ZEBETA Take one tablet (5 mg dose) by mouth daily. What changed:  how much to take how to take this when to take this   cefdinir 300 MG capsule Commonly known as: OMNICEF Take 1 capsule (300 mg total) by mouth every 12 (  twelve) hours for 4 days. What changed:  how much to take how to take this when to take this   cloNIDine 0.3 MG tablet Commonly known as: CATAPRES TAKE 1 TABLET BY MOUTH TWICE DAILY   cyclobenzaprine 5 MG tablet Commonly known as: FLEXERIL TAKE 1 TABLET BY MOUTH AT BEDTIME AS NEEDED FOR MUSCLE SPASMS What changed:  how much to take how to  take this when to take this   DULoxetine 30 MG capsule Commonly known as: CYMBALTA Take one capsule (30 mg dose) by mouth daily. What changed:  how much to take when to take this   gabapentin 400 MG capsule Commonly known as: NEURONTIN Take 3 capsules (1,200 mg total) by mouth 2 (two) times daily.   losartan 100 MG tablet Commonly known as: COZAAR TAKE 1 TABLET BY MOUTH DAILY. What changed:  how much to take when to take this   metFORMIN 500 MG tablet Commonly known as: GLUCOPHAGE TAKE 1 TABLET BY MOUTH ONCE A DAY WITH BREAKFAST   nitroGLYCERIN 0.4 MG SL tablet Commonly known as: NITROSTAT PLACE 1 TABLET UNDER THE TONGUE EVERY 5  MINUTES AS NEEDED AS DIRECTED. What changed:  how much to take when to take this reasons to take this   nortriptyline 25 MG capsule Commonly known as: PAMELOR TAKE 1 CAPSULE BY MOUTH EVERY NIGHT AT BEDTIME   pantoprazole 20 MG tablet Commonly known as: PROTONIX TAKE 1 TABLET BY MOUTH DAILY What changed:  how much to take when to take this   polyethylene glycol 17 g packet Commonly known as: MiraLax Take 17 g by mouth daily as needed for mild constipation.   potassium chloride SA 20 MEQ tablet Commonly known as: KLOR-CON M Take 1 tablet by mouth once daily. What changed: when to take this   prochlorperazine 10 MG tablet Commonly known as: COMPAZINE Take 1 tablet (10 mg total) by mouth every 6 (six) hours as needed for nausea or vomiting.   senna 8.6 MG Tabs tablet Commonly known as: SENOKOT Take 1 tablet (8.6 mg total) by mouth at bedtime as needed for mild constipation.   Spiriva Respimat 2.5 MCG/ACT Aers Generic drug: Tiotropium Bromide Monohydrate Inhale two puffs into the lungs as needed. What changed:  how much to take how to take this when to take this   Symbicort 160-4.5 MCG/ACT inhaler Generic drug: budesonide-formoterol INHALE 2 PUFFS INTO THE LUNGS TWICE DAILY What changed: Another medication with the same name  was removed. Continue taking this medication, and follow the directions you see here.   tamsulosin 0.4 MG Caps capsule Commonly known as: FLOMAX Take one capsule (0.4 mg dose) by mouth daily. What changed:  how much to take when to take this   temazepam 15 MG capsule Commonly known as: RESTORIL Take 15 mg by mouth at bedtime.   VITAMIN B 12 PO Take 1 tablet by mouth every morning.               Durable Medical Equipment  (From admission, onward)           Start     Ordered   03/22/21 1013  For home use only DME oxygen  Once       Question Answer Comment  Length of Need 6 Months   Mode or (Route) Nasal cannula   Liters per Minute 2   Frequency Continuous (stationary and portable oxygen unit needed)   Oxygen delivery system Gas      03/22/21 1012  Follow-up [facility] Follow up.   Why: Your home oxygen company Contact information: [place] [address] [phone]         [name], MD Follow up.   Specialty: Oncology Why: Office will contact you with an appointment Contact information: [place] [address] [phone]         [name], MD. Schedule an appointment as soon as possible for a visit in 1 week(s).   Specialty: Family Medicine Contact information: [address] [place] [address] [phone]                Feels much better  Discharge Exam: Filed Weights   03/23/21 1133  Weight: 86 kg   Physical Exam Vitals reviewed.  Constitutional:      General: He is not in acute distress.    Appearance: He is not ill-appearing or toxic-appearing.  Cardiovascular:     Rate and Rhythm: Normal rate and regular rhythm.     Heart sounds: No murmur heard. Pulmonary:     Effort: No respiratory distress.     Breath sounds: No wheezing, rhonchi or rales.     Comments: Mild increased respiratory  effort Neurological:     Mental Status: He is alert.  Psychiatric:        Mood and Affect: Mood normal.        Behavior: Behavior normal.     Condition at discharge: good  The results of significant diagnostics from this hospitalization (including imaging, microbiology, ancillary and laboratory) are listed below for reference.   Imaging Studies: CT Chest W Contrast  Result Date: 03/05/2021 CLINICAL DATA:  Primary Cancer Type: Lung Imaging Indication: Assess response to therapy Interval therapy since last imaging? Yes Initial Cancer Diagnosis Date: 01/24/2019; Established by: Biopsy-proven Detailed Pathology: Initial diagnosis limited stage small cell lung cancer. Primary Tumor location: Right lung. Recurrence? Yes; Date(s) of recurrence: 09/12/2019; Established by: Biopsy-proven Surgeries: Right upper lobectomy and stapling of blebs for spontaneous pneumothorax 03/06/2008. Coronary stent 2017. Chemotherapy: Yes; Ongoing? No; Most recent administration: 12/09/2019 Immunotherapy?  Yes; Type: Imfinzi; Ongoing? Yes Radiation therapy? Yes Date Range: 05/18/2020 - 05/25/2020; Target: Right lung Date Range: 02/17/2020 - 02/28/2020; Target: Whole brain Date Range: 03/13/2019 - 03/20/2019; Target: Right lung EXAM: CT CHEST, ABDOMEN, AND PELVIS WITH CONTRAST TECHNIQUE: Multidetector CT imaging of the chest, abdomen and pelvis was performed following the standard protocol during bolus administration of intravenous contrast. RADIATION DOSE REDUCTION: This exam was performed according to the departmental dose-optimization program which includes automated exposure control, adjustment of the mA and/or kV according to patient size and/or use of iterative reconstruction technique. CONTRAST:  15mL OMNIPAQUE IOHEXOL 300 MG/ML  SOLN COMPARISON:  Most recent CT chest, abdomen and pelvis 01/07/2021. 08/19/2019 PET-CT. FINDINGS: CT CHEST FINDINGS Cardiovascular: The heart size appears within normal limits. No pericardial  effusion. Aortic atherosclerosis and coronary artery calcifications. Mediastinum/Nodes: Index right paratracheal lymph node measures 1 cm, image 17/2. Unchanged from previous exam. The enlarged right hilar lymph node is unchanged in size measuring 2.7 cm, image 37/2. No progressive mediastinal or hilar adenopathy identified at this time. Lungs/Pleura: Status post right upper lobectomy. No pleural effusion. Severe changes of bullous emphysema. Chronic volume loss within the medial left upper lobe along the left heart border is again noted. Previously referenced nodular component measures 1.8 cm, image 101/4. Stable from previous exam. Since the previous  exam there is been interval development of numerous pulmonary nodules throughout both lungs. These are most numerous within the right lung. Some of these nodules have a central cavitary component. Index nodules include: -nodule within the anterior right lower lobe measures 0.7 cm, image 73/4. -cavitary nodule in the posterior right lower lobe measures 1.1 cm, image 128/4. -right middle lobe nodule measures 5 mm, image 62/4. -within the posteromedial left lower lobe there is a nodule measuring 6 mm, image 87/4. Musculoskeletal: No chest wall mass or suspicious bone lesions identified. CT ABDOMEN PELVIS FINDINGS Hepatobiliary: No suspicious liver lesion. 1.1 cm gallstone identified. No gallbladder wall thickening or inflammation. No significant bile duct dilatation. Pancreas: Unremarkable. No pancreatic ductal dilatation or surrounding inflammatory changes. Spleen: Normal in size without focal abnormality. Adrenals/Urinary Tract: Normal adrenal glands. Simple cyst arising off the upper pole of left kidney measures 3.3 cm, image 66/2. Smaller cyst arises off the inferior pole of the right kidney measuring 1 cm, image 77/2. No nephrolithiasis or hydronephrosis. Bladder is unremarkable. Stomach/Bowel: Moderate size hiatal hernia. No pathologic dilatation of the large or  small bowel loops. Vascular/Lymphatic: Aortic atherosclerosis. No aneurysm. No abdominopelvic adenopathy. Reproductive: Prostate is unremarkable. Other: No free fluid or fluid collections. Musculoskeletal: No aggressive lytic or sclerotic bone lesions. Degenerative disc disease noted at L5-S1. IMPRESSION: 1. Interval development of numerous pulmonary nodules throughout both lungs. Some of these nodules have a central cavitary component. Findings are concerning for metastatic disease. 2. Stable appearance of enlarged right hilar and prominent right paratracheal lymph nodes. 3. Cholelithiasis. 4. Hiatal hernia. 5. Aortic Atherosclerosis (ICD10-I70.0) and Emphysema (ICD10-J43.9). Electronically Signed   By: Kerby Moors [name]   On: 03/05/2021 11:20   CT Abdomen Pelvis W Contrast  Result Date: 03/08/2021 CLINICAL DATA:  Primary Cancer Type: Lung Imaging Indication: Assess response to therapy Interval therapy since last imaging? Yes Initial Cancer Diagnosis Date: 01/24/2019; Established by: Biopsy-proven Detailed Pathology: Initial diagnosis limited stage small cell lung cancer. Primary Tumor location: Right lung. Recurrence? Yes; Date(s) of recurrence: 09/12/2019; Established by: Biopsy-proven Surgeries: Right upper lobectomy and stapling of blebs for spontaneous pneumothorax 03/06/2008. Coronary stent 2017. Chemotherapy: Yes; Ongoing? No; Most recent administration: 12/09/2019 Immunotherapy?  Yes; Type: Imfinzi; Ongoing? Yes Radiation therapy? Yes Date Range: 05/18/2020 - 05/25/2020; Target: Right lung Date Range: 02/17/2020 - 02/28/2020; Target: Whole brain Date Range: 03/13/2019 - 03/20/2019; Target: Right lung EXAM: CT CHEST, ABDOMEN, AND PELVIS WITH CONTRAST TECHNIQUE: Multidetector CT imaging of the chest, abdomen and pelvis was performed following the standard protocol during bolus administration of intravenous contrast. RADIATION DOSE REDUCTION: This exam was performed according to the departmental  dose-optimization program which includes automated exposure control, adjustment of the mA and/or kV according to patient size and/or use of iterative reconstruction technique. CONTRAST:  165mL OMNIPAQUE IOHEXOL 300 MG/ML  SOLN COMPARISON:  Most recent CT chest, abdomen and pelvis 01/07/2021. 08/19/2019 PET-CT. FINDINGS: CT CHEST FINDINGS Cardiovascular: The heart size appears within normal limits. No pericardial effusion. Aortic atherosclerosis and coronary artery calcifications. Mediastinum/Nodes: Index right paratracheal lymph node measures 1 cm, image 17/2. Unchanged from previous exam. The enlarged right hilar lymph node is unchanged in size measuring 2.7 cm, image 37/2. No progressive mediastinal or hilar adenopathy identified at this time. Lungs/Pleura: Status post right upper lobectomy. No pleural effusion. Severe changes of bullous emphysema. Chronic volume loss within the medial left upper lobe along the left heart border is again noted. Previously referenced nodular component measures 1.8 cm, image 101/4. Stable from previous exam.  Since the previous exam there is been interval development of numerous pulmonary nodules throughout both lungs. These are most numerous within the right lung. Some of these nodules have a central cavitary component. Index nodules include: -nodule within the anterior right lower lobe measures 0.7 cm, image 73/4. -cavitary nodule in the posterior right lower lobe measures 1.1 cm, image 128/4. -right middle lobe nodule measures 5 mm, image 62/4. -within the posteromedial left lower lobe there is a nodule measuring 6 mm, image 87/4. Musculoskeletal: No chest wall mass or suspicious bone lesions identified. CT ABDOMEN PELVIS FINDINGS Hepatobiliary: No suspicious liver lesion. 1.1 cm gallstone identified. No gallbladder wall thickening or inflammation. No significant bile duct dilatation. Pancreas: Unremarkable. No pancreatic ductal dilatation or surrounding inflammatory changes.  Spleen: Normal in size without focal abnormality. Adrenals/Urinary Tract: Normal adrenal glands. Simple cyst arising off the upper pole of left kidney measures 3.3 cm, image 66/2. Smaller cyst arises off the inferior pole of the right kidney measuring 1 cm, image 77/2. No nephrolithiasis or hydronephrosis. Bladder is unremarkable. Stomach/Bowel: Moderate size hiatal hernia. No pathologic dilatation of the large or small bowel loops. Vascular/Lymphatic: Aortic atherosclerosis. No aneurysm. No abdominopelvic adenopathy. Reproductive: Prostate is unremarkable. Other: No free fluid or fluid collections. Musculoskeletal: No aggressive lytic or sclerotic bone lesions. Degenerative disc disease noted at L5-S1. IMPRESSION: 1. Interval development of numerous pulmonary nodules throughout both lungs. Some of these nodules have a central cavitary component. Findings are concerning for metastatic disease. 2. Stable appearance of enlarged right hilar and prominent right paratracheal lymph nodes. 3. Cholelithiasis. 4. Hiatal hernia. 5. Aortic Atherosclerosis (ICD10-I70.0) and Emphysema (ICD10-J43.9). Electronically Signed   By: Kerby Moors [name]   On: 03/08/2021 10:31   DG Chest Portable 1 View  Result Date: 03/20/2021 CLINICAL DATA:  Short of breath. Patient is starting a new chemotherapy treatment for carcinoma. EXAM: PORTABLE CHEST 1 VIEW COMPARISON:  09/12/2019.  CT, 03/04/2021. FINDINGS: There is hazy opacity in the mid and lower lungs, right greater than left, more confluent opacity noted in the medial left lung base. This has progressed compared to the prior chest radiograph. Upper lungs are lucent consistent with advanced emphysema, stable. Small ill-defined nodules are vaguely perceived radiographically, better seen on the more recent CT. No definite pleural effusion.  No pneumothorax. Cardiac silhouette is normal in size. No convincing mediastinal or hilar masses. IMPRESSION: 1. Hazy opacity in the mid to lower  lungs, right greater than left, which is in the area of most of the normal residual lung tissue. Findings consistent with acute bilateral infection or inflammation. Additional left lung base opacity is likely atelectasis. 2. Advanced bullous emphysema stable from the prior exams. Electronically Signed   By: Lajean Manes [name]   On: 03/20/2021 13:15    Microbiology: Results for orders placed or performed during the hospital encounter of 03/20/21  Culture, blood (routine x 2)     Status: None (Preliminary result)   Collection Time: 03/20/21  1:59 PM   Specimen: BLOOD  Result Value Ref Range Status   Specimen Description   Final    BLOOD BLOOD LEFT HAND Performed at [facility] [address] [place], [place] [address]    Special Requests   Final    BOTTLES DRAWN AEROBIC ONLY Blood Culture adequate volume Performed at [facility] [address] [place], [place] [address]    Culture   Final    NO GROWTH 3 DAYS Performed at [facility],  [address] [place], [place] [address]    Report Status PENDING  Incomplete  Culture, blood (routine x 2)     Status: None (Preliminary result)   Collection Time: 03/20/21  1:59 PM   Specimen: BLOOD  Result Value Ref Range Status   Specimen Description   Final    BLOOD BLOOD RIGHT FOREARM Performed at [facility] [address], [place], [place] [address]    Special Requests   Final    BOTTLES DRAWN AEROBIC AND ANAEROBIC Blood Culture adequate volume Performed at [facility] [address] [place], [place] [address]    Culture   Final    NO GROWTH 3 DAYS Performed at [facility], [place] [address] [place] [address] [place], [place] [address]    Report Status PENDING  Incomplete  Resp Panel by RT-PCR (Flu A&B, Covid) Nasopharyngeal Swab     Status: None   Collection Time: 03/20/21  3:38 PM   Specimen: Nasopharyngeal Swab; Nasopharyngeal(NP) swabs in vial transport  medium  Result Value Ref Range Status   SARS Coronavirus 2 by RT PCR NEGATIVE NEGATIVE Final    Comment: (NOTE) SARS-CoV-2 target nucleic acids are NOT DETECTED.  The SARS-CoV-2 RNA is generally detectable in upper respiratory specimens during the acute phase of infection. The lowest concentration of SARS-CoV-2 viral copies this assay can detect is 138 copies/mL. A negative result does not preclude SARS-Cov-2 infection and should not be used as the sole basis for treatment or other patient management decisions. A negative result may occur with  improper specimen collection/handling, submission of specimen other than nasopharyngeal swab, presence of viral mutation(s) within the areas targeted by this assay, and inadequate number of viral copies(<138 copies/mL). A negative result must be combined with clinical observations, patient history, and epidemiological information. The expected result is Negative.  Fact Sheet for Patients:  EntrepreneurPulse.com.au  Fact Sheet for Healthcare Providers:  IncredibleEmployment.be  This test is no t yet approved or cleared by the [place] FDA and  has been authorized for detection and/or diagnosis of SARS-CoV-2 by FDA under an Emergency Use Authorization (EUA). This EUA will remain  in effect (meaning this test can be used) for the duration of the COVID-19 declaration under Section 564(b)(1) of the Act, 21 U.S.C.section 360bbb-3(b)(1), unless the authorization is terminated  or revoked sooner.       Influenza A by PCR NEGATIVE NEGATIVE Final   Influenza B by PCR NEGATIVE NEGATIVE Final    Comment: (NOTE) The Xpert Xpress SARS-CoV-2/FLU/RSV plus assay is intended as an aid in the diagnosis of influenza from Nasopharyngeal swab specimens and should not be used as a sole basis for treatment. Nasal washings and aspirates are unacceptable for Xpert Xpress SARS-CoV-2/FLU/RSV testing.  Fact Sheet for  Patients: EntrepreneurPulse.com.au  Fact Sheet for Healthcare Providers: IncredibleEmployment.be  This test is not yet approved or cleared by the [place] FDA and has been authorized for detection and/or diagnosis of SARS-CoV-2 by FDA under an Emergency Use Authorization (EUA). This EUA will remain in effect (meaning this test can be used) for the duration of the COVID-19 declaration under Section 564(b)(1) of the Act, 21 U.S.C. section 360bbb-3(b)(1), unless the authorization is terminated or revoked.  Performed at [facility], [place] [address], [place],  [address]     Labs: CBC: Recent Labs  Lab 03/20/21 1318 03/21/21 0543  WBC 4.8 4.5  NEUTROABS 3.4  --   HGB 11.0* 10.0*  HCT 34.6* 31.0*  MCV 81.8 81.4  PLT 233 740   Basic Metabolic Panel: Recent Labs  Lab 03/20/21 1318 03/21/21 0543  NA 130* 131*  K 4.3 4.1  CL 98 102  CO2 25 23  GLUCOSE 109* 132*  BUN 29* 18  CREATININE 1.05 0.96  CALCIUM 8.2* 7.7*   Liver Function Tests: Recent Labs  Lab 03/20/21 1318 03/21/21 0543  AST 35 38  ALT 41 35  ALKPHOS 65 54  BILITOT 0.7 1.2  PROT 7.0 6.5  ALBUMIN 3.0* 2.7*   CBG: Recent Labs  Lab 03/22/21 0753 03/22/21 1252 03/22/21 1724 03/22/21 2104 03/23/21 0800  GLUCAP 127* 139* 104* 172* 127*    Discharge time spent: less than 30 minutes.  Signed: Murray Hodgkins, MD [facility] 03/23/2021

## 2021-03-23 NOTE — Discharge Planning (Signed)
Oncology Discharge Planning Admission Note ? ?[facility] ?Address: [place], [place], [place] [address] ?Hours of Operation:  8am - 5pm, Monday - Friday  ?Clinic Contact Information:  [phone] ? ?Oncology Care Team: ?Medical Oncologist:  Dr. [name] ? ?Myrtha Mantis - NP is aware of this hospital admission dated 03/21/21 and has assessed at bedside. The cancer center will follow Casy Billups?s inpatient care to assist with discharge planning as indicated by the oncologist.  We will arrange for necessary outpatient follow up closer to discharge. ? ?Disclaimer:  This [facility] note does not imply a formal consult request has been made by the admitting attending for this admission or there will be an inpatient consult completed by oncology.  Please request oncology consults as per standard process as indicated. ?

## 2021-03-23 NOTE — Progress Notes (Signed)
?   03/23/21 1157  ?Oxygen Therapy  ?SpO2 94 %  ?O2 Device Nasal Cannula  ?O2 Flow Rate (L/min) 2 L/min  ?Patient Activity (if Appropriate) Ambulating  ?Mobility  ?Activity Ambulated with assistance in hallway  ?Level of Assistance Standby assist, set-up cues, supervision of patient - no hands on  ?Assistive Device Front wheel walker  ?Distance Ambulated (ft) 80 ft  ?Activity Response Tolerated well  ?$Mobility charge 1 Mobility  ? ?Pt agreeable to mobilize this morning. Ambulated about 60ft in hall with RW, tolerated well. No complaints. Left pt in chair, call bell at side. RN/NT notified of session.  ? ?Rudell Cobb. ?Mobility Specialist ?Acute Rehab Services ?Office: [phone] ? ? ?

## 2021-03-23 NOTE — Progress Notes (Signed)
?   03/23/21 1211  ?What Happened  ?Was fall witnessed? Yes  ?Who witnessed fall? patients wife  ?Patients activity before fall to/from bed, chair, or stretcher  ?Point of contact buttocks  ?Was patient injured? No  ?Follow Up  ?MD notified Murray Hodgkins, MD  ?Time MD notified [phone]  ?Family notified Yes - comment  ?Time family notified 1213  ?Progress note created (see row info) Yes  ? ?Patients wife was helping patient move from the chair to the bed. Patient slid and fell to buttocks, patient did not hit his head and is not complaining of any pain. Murray Hodgkins, MD notified, will wait for orders from MD. Update, spoke with Dr. Sarajane Jews, he stated if patient did not hit his head or is complaining of any pain, ok to continue with discharge.  ?

## 2021-03-23 NOTE — Progress Notes (Signed)
HEMATOLOGY-ONCOLOGY PROGRESS NOTE  ASSESSMENT AND PLAN: This is a very pleasant 71 year old white male recently diagnosed with limited stage small cell lung cancer (T3, N2, M0) presented with pleural-based pulmonary nodules in the right lung in addition to mediastinal lymphadenopathy in August 2021.  The patient was initially diagnosed with a pleural-based nodule status post SBRT in March 2021. He completed systemic chemotherapy with carboplatin for AUC of 5 from day 1 and to etoposide 100 mg/M2 on days 1, 2 and 3 with Neulasta support every 3 weeks.  Status post 4 cycles. The patient tolerated his treatment well except for fatigue. He also underwent palliative radiotherapy to enlarging right lung nodule under the care of Dr. Sondra Come.  He was then placed on observation.  He then had a restaging CT scan of the chest which showed new and enlarging bilateral pulmonary nodules and the enlarging right hilar lymph nodes consistent with metastatic disease.  He was then placed on palliative systemic chemotherapy with carboplatin for AUC of 5 on day 1, etoposide 100 Mg/M2 on days 1, 2 and 3 with Cosela before the chemotherapy as well as Imfinzi 1500 Mg IV on day 1 every 3 weeks with the chemotherapy followed by maintenance every 4 weeks.  Status post 9 cycles.  Starting from cycle #5 the patient will be on maintenance treatment with single agent Imfinzi 1500 Mg IV every 4 weeks.  Cosela was discontinued after cycle #1 secondary to phlebitis and the patient refused to continue with the treatment.  He tolerated his maintenance immunotherapy well overall.  He had repeat CT scan of the chest, abdomen pelvis performed recently. Unfortunately his scan showed interval development of numerous pulmonary nodules throughout both lungs some of these have central cavitation concerning for metastatic disease.  He was given the option of palliative care and hospice referral versus consideration of third line treatment with Zepzelca  (lurbinectedin) 3.2 Mg/M2 every 3 weeks.  He opted to begin treatment.  This was started on 03/15/2021.  Plan is to continue to evaluate him as an outpatient and continue treatment.  He is now admitted for pneumonia and hypoxic respiratory failure.  Overall, his condition has improved.  He is now on oral antibiotics and not having any fevers.  He still remains on oxygen at 2 L/min and will be going home with oxygen.  He has been discharged by the hospitalist and will be leaving later today.  We will continue to follow him as previously scheduled in our office.  I advised the patient to call our office if any questions or issues arise before he is due back to see [place].  [name], DNP, AGPCNP-BC, AOCNP  SUBJECTIVE: Jonathon [name] is followed by our office for relapsed small cell lung cancer that was initially diagnosed as limited stage (T3, N2, M0) diagnosed in August 2021.  Disease relapse was diagnosed in June 2022.  He is receiving Zepzelca 3.2 mg per metered squared every 3 weeks.  He received his first dose on 03/15/2021.  Now admitted to the hospital with pneumonia and acute hypoxic respiratory failure secondary to pneumonia and emphysema.  On oral antibiotics.  No fevers.  He reports that he is feeling better today.  Breathing improved.  He is still on oxygen and plan to go home with this.  He will be discharged to home later today.  Oncology History  Primary lung small cell carcinoma, right ([place])  09/26/2019 Initial Diagnosis   Primary lung small cell carcinoma, right ([place])  10/07/2019 - 12/13/2019 Chemotherapy          04/08/2020 Cancer Staging   Staging form: Lung, AJCC 8th Edition - Clinical: Stage IIIB (cT3, cN2, cM0) - Signed by [name], MD on 04/08/2020    07/27/2020 - 02/08/2021 Chemotherapy   Patient is on Treatment Plan : LUNG SMALL CELL EXTENSIVE STAGE Durvalumab + Carboplatin D1 + Etoposide D1-3 q21d x 4 Cycles / Durvalumab q28d     03/16/2021 -  Chemotherapy    Patient is on Treatment Plan : LUNG SMALL CELL Lurbinectedin q21d        REVIEW OF SYSTEMS:   Review of Systems  Constitutional:  Negative for chills and fever.  HENT: Negative.    Eyes: Negative.   Respiratory:  Positive for shortness of breath.        Overall shortness of breath has improved.  Remains on oxygen at 2 L/min.  Cardiovascular: Negative.   Gastrointestinal: Negative.   Genitourinary: Negative.   Skin: Negative.   Neurological: Negative.   Endo/Heme/Allergies: Negative.   Psychiatric/Behavioral: Negative.     I have reviewed the past medical history, past surgical history, social history and family history with the patient and they are unchanged from previous note.   PHYSICAL EXAMINATION: ECOG PERFORMANCE STATUS: 2 - Symptomatic, <50% confined to bed  Vitals:   03/23/21 0342 03/23/21 0811  BP: 118/71   Pulse: 78   Resp: 20   Temp: 98.3 F (36.8 C)   SpO2: (!) 88% 92%   There were no vitals filed for this visit.  Intake/Output from previous day: 03/06 0701 - 03/07 0700 In: 680 [P.O.:480; IV Piggyback:200] Out: -   Physical Exam Vitals reviewed.  Constitutional:      General: He is not in acute distress.    Appearance: He is not ill-appearing.  HENT:     Head: Normocephalic.  Eyes:     General: No scleral icterus.    Conjunctiva/sclera: Conjunctivae normal.  Pulmonary:     Effort: Pulmonary effort is normal. No respiratory distress.  Skin:    General: Skin is warm and dry.  Neurological:     Mental Status: He is alert and oriented to person, place, and time.  Psychiatric:        Mood and Affect: Mood normal.        Behavior: Behavior normal.        Thought Content: Thought content normal.        Judgment: Judgment normal.    LABORATORY DATA:  I have reviewed the data as listed CMP Latest Ref Rng & Units 03/21/2021 03/20/2021 03/16/2021  Glucose 70 - 99 mg/dL 132(H) 109(H) 144(H)  BUN 8 - 23 mg/dL 18 29(H) 9  Creatinine 0.61 - 1.24 mg/dL 0.96  1.05 0.90  Sodium 135 - 145 mmol/L 131(L) 130(L) 134(L)  Potassium 3.5 - 5.1 mmol/L 4.1 4.3 3.5  Chloride 98 - 111 mmol/L 102 98 101  CO2 22 - 32 mmol/L 23 25 26   Calcium 8.9 - 10.3 mg/dL 7.7(L) 8.2(L) 9.0  Total Protein 6.5 - 8.1 g/dL 6.5 7.0 7.7  Total Bilirubin 0.3 - 1.2 mg/dL 1.2 0.7 0.5  Alkaline Phos 38 - 126 U/L 54 65 74  AST 15 - 41 U/L 38 35 9(L)  ALT 0 - 44 U/L 35 41 7    Lab Results  Component Value Date   WBC 4.5 03/21/2021   HGB 10.0 (L) 03/21/2021   HCT 31.0 (L) 03/21/2021   MCV 81.4 03/21/2021  PLT 179 03/21/2021   NEUTROABS 3.4 03/20/2021    No results found for: CEA1, CEA, [id number], CA125, PSA1  CT Chest W Contrast  Result Date: 03/05/2021 CLINICAL DATA:  Primary Cancer Type: Lung Imaging Indication: Assess response to therapy Interval therapy since last imaging? Yes Initial Cancer Diagnosis Date: 01/24/2019; Established by: Biopsy-proven Detailed Pathology: Initial diagnosis limited stage small cell lung cancer. Primary Tumor location: Right lung. Recurrence? Yes; Date(s) of recurrence: 09/12/2019; Established by: Biopsy-proven Surgeries: Right upper lobectomy and stapling of blebs for spontaneous pneumothorax 03/06/2008. Coronary stent 2017. Chemotherapy: Yes; Ongoing? No; Most recent administration: 12/09/2019 Immunotherapy?  Yes; Type: Imfinzi; Ongoing? Yes Radiation therapy? Yes Date Range: 05/18/2020 - 05/25/2020; Target: Right lung Date Range: 02/17/2020 - 02/28/2020; Target: Whole brain Date Range: 03/13/2019 - 03/20/2019; Target: Right lung EXAM: CT CHEST, ABDOMEN, AND PELVIS WITH CONTRAST TECHNIQUE: Multidetector CT imaging of the chest, abdomen and pelvis was performed following the standard protocol during bolus administration of intravenous contrast. RADIATION DOSE REDUCTION: This exam was performed according to the departmental dose-optimization program which includes automated exposure control, adjustment of the mA and/or kV according to patient size and/or  use of iterative reconstruction technique. CONTRAST:  126mL OMNIPAQUE IOHEXOL 300 MG/ML  SOLN COMPARISON:  Most recent CT chest, abdomen and pelvis 01/07/2021. 08/19/2019 PET-CT. FINDINGS: CT CHEST FINDINGS Cardiovascular: The heart size appears within normal limits. No pericardial effusion. Aortic atherosclerosis and coronary artery calcifications. Mediastinum/Nodes: Index right paratracheal lymph node measures 1 cm, image 17/2. Unchanged from previous exam. The enlarged right hilar lymph node is unchanged in size measuring 2.7 cm, image 37/2. No progressive mediastinal or hilar adenopathy identified at this time. Lungs/Pleura: Status post right upper lobectomy. No pleural effusion. Severe changes of bullous emphysema. Chronic volume loss within the medial left upper lobe along the left heart border is again noted. Previously referenced nodular component measures 1.8 cm, image 101/4. Stable from previous exam. Since the previous exam there is been interval development of numerous pulmonary nodules throughout both lungs. These are most numerous within the right lung. Some of these nodules have a central cavitary component. Index nodules include: -nodule within the anterior right lower lobe measures 0.7 cm, image 73/4. -cavitary nodule in the posterior right lower lobe measures 1.1 cm, image 128/4. -right middle lobe nodule measures 5 mm, image 62/4. -within the posteromedial left lower lobe there is a nodule measuring 6 mm, image 87/4. Musculoskeletal: No chest wall mass or suspicious bone lesions identified. CT ABDOMEN PELVIS FINDINGS Hepatobiliary: No suspicious liver lesion. 1.1 cm gallstone identified. No gallbladder wall thickening or inflammation. No significant bile duct dilatation. Pancreas: Unremarkable. No pancreatic ductal dilatation or surrounding inflammatory changes. Spleen: Normal in size without focal abnormality. Adrenals/Urinary Tract: Normal adrenal glands. Simple cyst arising off the upper pole  of left kidney measures 3.3 cm, image 66/2. Smaller cyst arises off the inferior pole of the right kidney measuring 1 cm, image 77/2. No nephrolithiasis or hydronephrosis. Bladder is unremarkable. Stomach/Bowel: Moderate size hiatal hernia. No pathologic dilatation of the large or small bowel loops. Vascular/Lymphatic: Aortic atherosclerosis. No aneurysm. No abdominopelvic adenopathy. Reproductive: Prostate is unremarkable. Other: No free fluid or fluid collections. Musculoskeletal: No aggressive lytic or sclerotic bone lesions. Degenerative disc disease noted at L5-S1. IMPRESSION: 1. Interval development of numerous pulmonary nodules throughout both lungs. Some of these nodules have a central cavitary component. Findings are concerning for metastatic disease. 2. Stable appearance of enlarged right hilar and prominent right paratracheal lymph nodes. 3. Cholelithiasis. 4. Hiatal hernia.  5. Aortic Atherosclerosis (ICD10-I70.0) and Emphysema (ICD10-J43.9). Electronically Signed   By: Kerby Moors [name]   On: 03/05/2021 11:20   CT Abdomen Pelvis W Contrast  Result Date: 03/08/2021 CLINICAL DATA:  Primary Cancer Type: Lung Imaging Indication: Assess response to therapy Interval therapy since last imaging? Yes Initial Cancer Diagnosis Date: 01/24/2019; Established by: Biopsy-proven Detailed Pathology: Initial diagnosis limited stage small cell lung cancer. Primary Tumor location: Right lung. Recurrence? Yes; Date(s) of recurrence: 09/12/2019; Established by: Biopsy-proven Surgeries: Right upper lobectomy and stapling of blebs for spontaneous pneumothorax 03/06/2008. Coronary stent 2017. Chemotherapy: Yes; Ongoing? No; Most recent administration: 12/09/2019 Immunotherapy?  Yes; Type: Imfinzi; Ongoing? Yes Radiation therapy? Yes Date Range: 05/18/2020 - 05/25/2020; Target: Right lung Date Range: 02/17/2020 - 02/28/2020; Target: Whole brain Date Range: 03/13/2019 - 03/20/2019; Target: Right lung EXAM: CT CHEST, ABDOMEN,  AND PELVIS WITH CONTRAST TECHNIQUE: Multidetector CT imaging of the chest, abdomen and pelvis was performed following the standard protocol during bolus administration of intravenous contrast. RADIATION DOSE REDUCTION: This exam was performed according to the departmental dose-optimization program which includes automated exposure control, adjustment of the mA and/or kV according to patient size and/or use of iterative reconstruction technique. CONTRAST:  173mL OMNIPAQUE IOHEXOL 300 MG/ML  SOLN COMPARISON:  Most recent CT chest, abdomen and pelvis 01/07/2021. 08/19/2019 PET-CT. FINDINGS: CT CHEST FINDINGS Cardiovascular: The heart size appears within normal limits. No pericardial effusion. Aortic atherosclerosis and coronary artery calcifications. Mediastinum/Nodes: Index right paratracheal lymph node measures 1 cm, image 17/2. Unchanged from previous exam. The enlarged right hilar lymph node is unchanged in size measuring 2.7 cm, image 37/2. No progressive mediastinal or hilar adenopathy identified at this time. Lungs/Pleura: Status post right upper lobectomy. No pleural effusion. Severe changes of bullous emphysema. Chronic volume loss within the medial left upper lobe along the left heart border is again noted. Previously referenced nodular component measures 1.8 cm, image 101/4. Stable from previous exam. Since the previous exam there is been interval development of numerous pulmonary nodules throughout both lungs. These are most numerous within the right lung. Some of these nodules have a central cavitary component. Index nodules include: -nodule within the anterior right lower lobe measures 0.7 cm, image 73/4. -cavitary nodule in the posterior right lower lobe measures 1.1 cm, image 128/4. -right middle lobe nodule measures 5 mm, image 62/4. -within the posteromedial left lower lobe there is a nodule measuring 6 mm, image 87/4. Musculoskeletal: No chest wall mass or suspicious bone lesions identified. CT  ABDOMEN PELVIS FINDINGS Hepatobiliary: No suspicious liver lesion. 1.1 cm gallstone identified. No gallbladder wall thickening or inflammation. No significant bile duct dilatation. Pancreas: Unremarkable. No pancreatic ductal dilatation or surrounding inflammatory changes. Spleen: Normal in size without focal abnormality. Adrenals/Urinary Tract: Normal adrenal glands. Simple cyst arising off the upper pole of left kidney measures 3.3 cm, image 66/2. Smaller cyst arises off the inferior pole of the right kidney measuring 1 cm, image 77/2. No nephrolithiasis or hydronephrosis. Bladder is unremarkable. Stomach/Bowel: Moderate size hiatal hernia. No pathologic dilatation of the large or small bowel loops. Vascular/Lymphatic: Aortic atherosclerosis. No aneurysm. No abdominopelvic adenopathy. Reproductive: Prostate is unremarkable. Other: No free fluid or fluid collections. Musculoskeletal: No aggressive lytic or sclerotic bone lesions. Degenerative disc disease noted at L5-S1. IMPRESSION: 1. Interval development of numerous pulmonary nodules throughout both lungs. Some of these nodules have a central cavitary component. Findings are concerning for metastatic disease. 2. Stable appearance of enlarged right hilar and prominent right paratracheal lymph nodes. 3. Cholelithiasis.  4. Hiatal hernia. 5. Aortic Atherosclerosis (ICD10-I70.0) and Emphysema (ICD10-J43.9). Electronically Signed   By: Kerby Moors [name]   On: 03/08/2021 10:31   DG Chest Portable 1 View  Result Date: 03/20/2021 CLINICAL DATA:  Short of breath. Patient is starting a new chemotherapy treatment for carcinoma. EXAM: PORTABLE CHEST 1 VIEW COMPARISON:  09/12/2019.  CT, 03/04/2021. FINDINGS: There is hazy opacity in the mid and lower lungs, right greater than left, more confluent opacity noted in the medial left lung base. This has progressed compared to the prior chest radiograph. Upper lungs are lucent consistent with advanced emphysema, stable. Small  ill-defined nodules are vaguely perceived radiographically, better seen on the more recent CT. No definite pleural effusion.  No pneumothorax. Cardiac silhouette is normal in size. No convincing mediastinal or hilar masses. IMPRESSION: 1. Hazy opacity in the mid to lower lungs, right greater than left, which is in the area of most of the normal residual lung tissue. Findings consistent with acute bilateral infection or inflammation. Additional left lung base opacity is likely atelectasis. 2. Advanced bullous emphysema stable from the prior exams. Electronically Signed   By: Lajean Manes [name]   On: 03/20/2021 13:15     Future Appointments  Date Time Provider [place]  03/29/2021  1:30 PM [facility] None  04/05/2021 10:00 AM [facility] None  04/05/2021 10:45 AM [name], MD [facility] None  04/05/2021 11:30 AM [facility] INFUSION [facility] None  04/05/2021 12:00 PM Jennet Maduro, RD [facility] None  04/12/2021  1:30 PM [facility] None  04/19/2021  3:00 PM Gery Pray, MD [facility] None  04/20/2021  1:00 PM [facility] None  04/27/2021 10:00 AM [facility] None  04/27/2021 10:30 AM Heilingoetter, Cassandra L, PA-C [facility] None  04/27/2021 11:45 AM [facility] INFUSION [facility] None  05/03/2021  1:00 PM [facility] None  05/10/2021  1:00 PM [facility] None  05/17/2021  3:00 PM Adrian Prows, MD [facility] None  05/20/2021 10:00 AM [facility] None  05/20/21 10:45 AM [name], MD [facility] None  05-20-21 11:45 AM [facility] INFUSION [facility] None  05/24/2021  1:45 PM [facility] None  05/31/2021  1:00 PM [facility] None  06/07/2021 10:00 AM [facility] None  06/07/2021 10:30 AM Heilingoetter, Cassandra L, PA-C [facility] None  06/07/2021 11:30 AM [facility] INFUSION [facility] None       LOS: 2 days

## 2021-03-24 ENCOUNTER — Telehealth (HOSPITAL_COMMUNITY): Payer: Self-pay | Admitting: *Deleted

## 2021-03-24 NOTE — Telephone Encounter (Signed)
Nestor Springborn was contacted by telephone to verify understanding of discharge instructions status post their most recent discharge from the hospital on the date:  03/23/21.  Inpatient discharge AVS was re-reviewed with patient, along with cancer center appointments.  Verification of understanding for oncology specific follow-up was validated using the Teach Back method.  Patient has c/o constipation, however has not started Miralax or Senna that was suggested on discharge.  Also c/o reflux.  Educated him on how to use laxatives and to increase water intake to relieve constipation.  Advised that once he begins having normal bowel movements the reflux should settle, along with taking his protonix.  Wife will pick up medications today. ? ?Transportation to appointments were confirmed for the patient as being self/caregiver. ? ?Pranish Hinkson?s questions were addressed to their satisfaction upon completion of this post discharge follow-up call for outpatient oncology.  ?

## 2021-03-24 NOTE — ED Provider Notes (Signed)
I was notified by nursing team that this patient had blood cultures done on March 20, 2021 that returned positive for gram-positive rods in 1 out of 3 samples. ? ?I reached out to the patient and called him, it appears he had been admitted to the hospital for pneumonia and received antibiotics and discharged on antibiotics.  Patient is otherwise a cancer patient on chemotherapy.  He states that he has not had any fevers or chills and is continuing oral antibiotics upon discharge.  He does state that he is constipated given he thinks he ate too much food during the hospitalization.  Advised clear liquid diet and for him to reach out to his oncologist tomorrow. ? ?Advised him of his blood culture findings, advised immediate return if he has fevers or chills or any additional concerns otherwise follow-up with his oncologist tomorrow.  Patient expressed understanding and all questions were answered. ?  ?Luna Fuse, MD ?03/24/21 1953 ? ?

## 2021-03-25 LAB — CULTURE, BLOOD (ROUTINE X 2)
Culture: NO GROWTH
Special Requests: ADEQUATE

## 2021-03-26 ENCOUNTER — Other Ambulatory Visit (HOSPITAL_COMMUNITY): Payer: Self-pay

## 2021-03-29 ENCOUNTER — Other Ambulatory Visit: Payer: Self-pay

## 2021-03-29 ENCOUNTER — Other Ambulatory Visit: Payer: Self-pay | Admitting: Internal Medicine

## 2021-03-29 ENCOUNTER — Inpatient Hospital Stay: Payer: Medicare Other | Attending: Internal Medicine

## 2021-03-29 ENCOUNTER — Other Ambulatory Visit (HOSPITAL_COMMUNITY): Payer: Self-pay

## 2021-03-29 ENCOUNTER — Telehealth: Payer: Self-pay | Admitting: Medical Oncology

## 2021-03-29 DIAGNOSIS — C3490 Malignant neoplasm of unspecified part of unspecified bronchus or lung: Secondary | ICD-10-CM

## 2021-03-29 DIAGNOSIS — K219 Gastro-esophageal reflux disease without esophagitis: Secondary | ICD-10-CM | POA: Insufficient documentation

## 2021-03-29 DIAGNOSIS — D709 Neutropenia, unspecified: Secondary | ICD-10-CM | POA: Insufficient documentation

## 2021-03-29 DIAGNOSIS — Z79899 Other long term (current) drug therapy: Secondary | ICD-10-CM | POA: Diagnosis not present

## 2021-03-29 DIAGNOSIS — J439 Emphysema, unspecified: Secondary | ICD-10-CM | POA: Insufficient documentation

## 2021-03-29 DIAGNOSIS — E119 Type 2 diabetes mellitus without complications: Secondary | ICD-10-CM | POA: Diagnosis not present

## 2021-03-29 DIAGNOSIS — C3431 Malignant neoplasm of lower lobe, right bronchus or lung: Secondary | ICD-10-CM | POA: Diagnosis present

## 2021-03-29 DIAGNOSIS — I110 Hypertensive heart disease with heart failure: Secondary | ICD-10-CM | POA: Insufficient documentation

## 2021-03-29 DIAGNOSIS — C771 Secondary and unspecified malignant neoplasm of intrathoracic lymph nodes: Secondary | ICD-10-CM | POA: Diagnosis not present

## 2021-03-29 DIAGNOSIS — I252 Old myocardial infarction: Secondary | ICD-10-CM | POA: Diagnosis not present

## 2021-03-29 DIAGNOSIS — R531 Weakness: Secondary | ICD-10-CM | POA: Insufficient documentation

## 2021-03-29 DIAGNOSIS — R0609 Other forms of dyspnea: Secondary | ICD-10-CM | POA: Diagnosis not present

## 2021-03-29 DIAGNOSIS — I2510 Atherosclerotic heart disease of native coronary artery without angina pectoris: Secondary | ICD-10-CM | POA: Insufficient documentation

## 2021-03-29 DIAGNOSIS — Z87891 Personal history of nicotine dependence: Secondary | ICD-10-CM | POA: Insufficient documentation

## 2021-03-29 DIAGNOSIS — R5383 Other fatigue: Secondary | ICD-10-CM | POA: Insufficient documentation

## 2021-03-29 DIAGNOSIS — Z5189 Encounter for other specified aftercare: Secondary | ICD-10-CM | POA: Insufficient documentation

## 2021-03-29 DIAGNOSIS — D701 Agranulocytosis secondary to cancer chemotherapy: Secondary | ICD-10-CM

## 2021-03-29 DIAGNOSIS — E7800 Pure hypercholesterolemia, unspecified: Secondary | ICD-10-CM | POA: Insufficient documentation

## 2021-03-29 DIAGNOSIS — T451X5A Adverse effect of antineoplastic and immunosuppressive drugs, initial encounter: Secondary | ICD-10-CM

## 2021-03-29 DIAGNOSIS — Z8673 Personal history of transient ischemic attack (TIA), and cerebral infarction without residual deficits: Secondary | ICD-10-CM | POA: Insufficient documentation

## 2021-03-29 DIAGNOSIS — Z7984 Long term (current) use of oral hypoglycemic drugs: Secondary | ICD-10-CM | POA: Diagnosis not present

## 2021-03-29 DIAGNOSIS — Z923 Personal history of irradiation: Secondary | ICD-10-CM | POA: Diagnosis not present

## 2021-03-29 DIAGNOSIS — Z5111 Encounter for antineoplastic chemotherapy: Secondary | ICD-10-CM | POA: Diagnosis present

## 2021-03-29 DIAGNOSIS — E876 Hypokalemia: Secondary | ICD-10-CM | POA: Insufficient documentation

## 2021-03-29 DIAGNOSIS — I509 Heart failure, unspecified: Secondary | ICD-10-CM | POA: Insufficient documentation

## 2021-03-29 LAB — CBC WITH DIFFERENTIAL (CANCER CENTER ONLY)
Abs Immature Granulocytes: 0 10*3/uL (ref 0.00–0.07)
Basophils Absolute: 0 10*3/uL (ref 0.0–0.1)
Basophils Relative: 0 %
Eosinophils Absolute: 0 10*3/uL (ref 0.0–0.5)
Eosinophils Relative: 1 %
HCT: 26.7 % — ABNORMAL LOW (ref 39.0–52.0)
Hemoglobin: 9 g/dL — ABNORMAL LOW (ref 13.0–17.0)
Immature Granulocytes: 0 %
Lymphocytes Relative: 52 %
Lymphs Abs: 0.6 10*3/uL — ABNORMAL LOW (ref 0.7–4.0)
MCH: 26.5 pg (ref 26.0–34.0)
MCHC: 33.7 g/dL (ref 30.0–36.0)
MCV: 78.5 fL — ABNORMAL LOW (ref 80.0–100.0)
Monocytes Absolute: 0.4 10*3/uL (ref 0.1–1.0)
Monocytes Relative: 35 %
Neutro Abs: 0.1 10*3/uL — CL (ref 1.7–7.7)
Neutrophils Relative %: 12 %
Platelet Count: 62 10*3/uL — ABNORMAL LOW (ref 150–400)
RBC: 3.4 MIL/uL — ABNORMAL LOW (ref 4.22–5.81)
RDW: 16.2 % — ABNORMAL HIGH (ref 11.5–15.5)
WBC Count: 1.1 10*3/uL — ABNORMAL LOW (ref 4.0–10.5)
nRBC: 0 % (ref 0.0–0.2)

## 2021-03-29 LAB — CMP (CANCER CENTER ONLY)
ALT: 35 U/L (ref 0–44)
AST: 21 U/L (ref 15–41)
Albumin: 3.1 g/dL — ABNORMAL LOW (ref 3.5–5.0)
Alkaline Phosphatase: 65 U/L (ref 38–126)
Anion gap: 8 (ref 5–15)
BUN: 10 mg/dL (ref 8–23)
CO2: 23 mmol/L (ref 22–32)
Calcium: 7.8 mg/dL — ABNORMAL LOW (ref 8.9–10.3)
Chloride: 101 mmol/L (ref 98–111)
Creatinine: 0.92 mg/dL (ref 0.61–1.24)
GFR, Estimated: >60 mL/min (ref >60)
Glucose, Bld: 147 mg/dL — ABNORMAL HIGH (ref 70–99)
Potassium: 3.4 mmol/L — ABNORMAL LOW (ref 3.5–5.1)
Sodium: 132 mmol/L — ABNORMAL LOW (ref 135–145)
Total Bilirubin: 0.6 mg/dL (ref 0.3–1.2)
Total Protein: 6.4 g/dL — ABNORMAL LOW (ref 6.5–8.1)

## 2021-03-29 NOTE — Telephone Encounter (Signed)
Wife notified that we are waiting on insurance auth for zarxio injections to boost  his immune sytem to fight infection.  ? ?I told her someone will call her tomorrow with a f/u and let her know when to bring him in. ?

## 2021-03-30 ENCOUNTER — Telehealth: Payer: Self-pay | Admitting: Internal Medicine

## 2021-03-30 ENCOUNTER — Other Ambulatory Visit (HOSPITAL_COMMUNITY): Payer: Self-pay

## 2021-03-30 ENCOUNTER — Inpatient Hospital Stay: Payer: Medicare Other

## 2021-03-30 ENCOUNTER — Ambulatory Visit: Payer: Medicare Other

## 2021-03-30 VITALS — BP 117/64 | HR 74 | Temp 98.2°F | Resp 18

## 2021-03-30 DIAGNOSIS — C3490 Malignant neoplasm of unspecified part of unspecified bronchus or lung: Secondary | ICD-10-CM

## 2021-03-30 DIAGNOSIS — Z5111 Encounter for antineoplastic chemotherapy: Secondary | ICD-10-CM | POA: Diagnosis not present

## 2021-03-30 DIAGNOSIS — D701 Agranulocytosis secondary to cancer chemotherapy: Secondary | ICD-10-CM

## 2021-03-30 MED ORDER — FILGRASTIM-SNDZ 480 MCG/0.8ML IJ SOSY
480.0000 ug | PREFILLED_SYRINGE | Freq: Once | INTRAMUSCULAR | Status: AC
  Administered 2021-03-30: 480 ug via SUBCUTANEOUS
  Filled 2021-03-30: qty 0.8

## 2021-03-30 MED ORDER — GABAPENTIN 400 MG PO CAPS
ORAL_CAPSULE | ORAL | 7 refills | Status: AC
  Filled 2021-03-30: qty 180, 30d supply, fill #0
  Filled 2021-04-27: qty 180, 30d supply, fill #1

## 2021-03-30 NOTE — Patient Instructions (Signed)
Filgrastim, G-CSF injection ?What is this medication? ?FILGRASTIM, G-CSF (fil GRA stim) is a granulocyte colony-stimulating factor that stimulates the growth of neutrophils, a type of white blood cell (WBC) important in the body's fight against infection. It is used to reduce the incidence of fever and infection in patients with certain types of cancer who are receiving chemotherapy that affects the bone marrow, to stimulate blood cell production for removal of WBCs from the body prior to a bone marrow transplantation, to reduce the incidence of fever and infection in patients who have severe chronic neutropenia, and to improve survival outcomes following high-dose radiation exposure that is toxic to the bone marrow. ?This medicine may be used for other purposes; ask your health care provider or pharmacist if you have questions. ?COMMON BRAND NAME(S): Neupogen, Nivestym, Releuko, Zarxio ?What should I tell my care team before I take this medication? ?They need to know if you have any of these conditions: ?kidney disease ?latex allergy ?ongoing radiation therapy ?sickle cell disease ?an unusual or allergic reaction to filgrastim, pegfilgrastim, other medicines, foods, dyes, or preservatives ?pregnant or trying to get pregnant ?breast-feeding ?How should I use this medication? ?This medicine is for injection under the skin or infusion into a vein. As an infusion into a vein, it is usually given by a health care professional in a hospital or clinic setting. If you get this medicine at home, you will be taught how to prepare and give this medicine. Refer to the Instructions for Use that come with your medication packaging. Use exactly as directed. Take your medicine at regular intervals. Do not take your medicine more often than directed. ?It is important that you put your used needles and syringes in a special sharps container. Do not put them in a trash can. If you do not have a sharps container, call your pharmacist  or healthcare provider to get one. ?Talk to your pediatrician regarding the use of this medicine in children. While this drug may be prescribed for children as young as 7 months for selected conditions, precautions do apply. ?Overdosage: If you think you have taken too much of this medicine contact a poison control center or emergency room at once. ?NOTE: This medicine is only for you. Do not share this medicine with others. ?What if I miss a dose? ?It is important not to miss your dose. Call your doctor or health care professional if you miss a dose. ?What may interact with this medication? ?This medicine may interact with the following medications: ?medicines that may cause a release of neutrophils, such as lithium ?This list may not describe all possible interactions. Give your health care provider a list of all the medicines, herbs, non-prescription drugs, or dietary supplements you use. Also tell them if you smoke, drink alcohol, or use illegal drugs. Some items may interact with your medicine. ?What should I watch for while using this medication? ?Your condition will be monitored carefully while you are receiving this medicine. ?You may need blood work done while you are taking this medicine. ?Talk to your health care provider about your risk of cancer. You may be more at risk for certain types of cancer if you take this medicine. ?What side effects may I notice from receiving this medication? ?Side effects that you should report to your doctor or health care professional as soon as possible: ?allergic reactions like skin rash, itching or hives, swelling of the face, lips, or tongue ?back pain ?dizziness or feeling faint ?fever ?pain, redness, or  irritation at site where injected ?pinpoint red spots on the skin ?shortness of breath or breathing problems ?signs and symptoms of kidney injury like trouble passing urine, change in the amount of urine, or red or dark-brown urine ?stomach or side pain, or pain at  the shoulder ?swelling ?tiredness ?unusual bleeding or bruising ?Side effects that usually do not require medical attention (report to your doctor or health care professional if they continue or are bothersome): ?bone pain ?cough ?diarrhea ?hair loss ?headache ?muscle pain ?This list may not describe all possible side effects. Call your doctor for medical advice about side effects. You may report side effects to FDA at 1-800-FDA-1088. ?Where should I keep my medication? ?Keep out of the reach of children. ?Store in a refrigerator between 2 and 8 degrees C (36 and 46 degrees F). Do not freeze. Keep in carton to protect from light. Throw away this medicine if vials or syringes are left out of the refrigerator for more than 24 hours. Throw away any unused medicine after the expiration date. ?NOTE: This sheet is a summary. It may not cover all possible information. If you have questions about this medicine, talk to your doctor, pharmacist, or health care provider. ?? 2022 Elsevier/Gold Standard (2020-09-22 00:00:00) ? ?

## 2021-03-30 NOTE — Telephone Encounter (Signed)
.  Called patient to schedule appointment per 3/13 inbasket, patient wife is aware of date and time.   ?

## 2021-03-31 ENCOUNTER — Inpatient Hospital Stay: Payer: Medicare Other

## 2021-03-31 ENCOUNTER — Other Ambulatory Visit: Payer: Self-pay

## 2021-03-31 VITALS — BP 109/52 | HR 75 | Temp 98.3°F | Resp 18

## 2021-03-31 DIAGNOSIS — C3490 Malignant neoplasm of unspecified part of unspecified bronchus or lung: Secondary | ICD-10-CM

## 2021-03-31 DIAGNOSIS — T451X5A Adverse effect of antineoplastic and immunosuppressive drugs, initial encounter: Secondary | ICD-10-CM

## 2021-03-31 DIAGNOSIS — Z5111 Encounter for antineoplastic chemotherapy: Secondary | ICD-10-CM | POA: Diagnosis not present

## 2021-03-31 MED ORDER — FILGRASTIM-SNDZ 480 MCG/0.8ML IJ SOSY
480.0000 ug | PREFILLED_SYRINGE | Freq: Once | INTRAMUSCULAR | Status: AC
  Administered 2021-03-31: 480 ug via SUBCUTANEOUS
  Filled 2021-03-31: qty 0.8

## 2021-04-01 ENCOUNTER — Inpatient Hospital Stay: Payer: Medicare Other

## 2021-04-01 VITALS — BP 105/58 | HR 74 | Temp 98.6°F | Resp 16

## 2021-04-01 DIAGNOSIS — Z5111 Encounter for antineoplastic chemotherapy: Secondary | ICD-10-CM | POA: Diagnosis not present

## 2021-04-01 MED ORDER — FILGRASTIM-SNDZ 480 MCG/0.8ML IJ SOSY
480.0000 ug | PREFILLED_SYRINGE | Freq: Once | INTRAMUSCULAR | Status: AC
  Administered 2021-04-01: 480 ug via SUBCUTANEOUS
  Filled 2021-04-01: qty 0.8

## 2021-04-02 MED FILL — Dexamethasone Sodium Phosphate Inj 100 MG/10ML: INTRAMUSCULAR | Qty: 1 | Status: AC

## 2021-04-05 ENCOUNTER — Inpatient Hospital Stay (HOSPITAL_BASED_OUTPATIENT_CLINIC_OR_DEPARTMENT_OTHER): Payer: Medicare Other | Admitting: Internal Medicine

## 2021-04-05 ENCOUNTER — Encounter: Payer: Self-pay | Admitting: Internal Medicine

## 2021-04-05 ENCOUNTER — Ambulatory Visit: Payer: Medicare Other

## 2021-04-05 ENCOUNTER — Other Ambulatory Visit: Payer: Self-pay

## 2021-04-05 ENCOUNTER — Inpatient Hospital Stay: Payer: Medicare Other

## 2021-04-05 ENCOUNTER — Other Ambulatory Visit: Payer: Medicare Other

## 2021-04-05 ENCOUNTER — Other Ambulatory Visit (HOSPITAL_COMMUNITY): Payer: Self-pay

## 2021-04-05 ENCOUNTER — Ambulatory Visit: Payer: Medicare Other | Admitting: Internal Medicine

## 2021-04-05 VITALS — BP 131/60 | Temp 98.1°F | Resp 17 | Wt 186.4 lb

## 2021-04-05 DIAGNOSIS — E876 Hypokalemia: Secondary | ICD-10-CM | POA: Diagnosis not present

## 2021-04-05 DIAGNOSIS — Z5111 Encounter for antineoplastic chemotherapy: Secondary | ICD-10-CM | POA: Diagnosis not present

## 2021-04-05 DIAGNOSIS — C3491 Malignant neoplasm of unspecified part of right bronchus or lung: Secondary | ICD-10-CM | POA: Diagnosis not present

## 2021-04-05 LAB — CMP (CANCER CENTER ONLY)
ALT: 19 U/L (ref 0–44)
AST: 14 U/L — ABNORMAL LOW (ref 15–41)
Albumin: 3.4 g/dL — ABNORMAL LOW (ref 3.5–5.0)
Alkaline Phosphatase: 91 U/L (ref 38–126)
Anion gap: 9 (ref 5–15)
BUN: 8 mg/dL (ref 8–23)
CO2: 24 mmol/L (ref 22–32)
Calcium: 8.5 mg/dL — ABNORMAL LOW (ref 8.9–10.3)
Chloride: 100 mmol/L (ref 98–111)
Creatinine: 0.95 mg/dL (ref 0.61–1.24)
GFR, Estimated: >60 mL/min (ref >60)
Glucose, Bld: 142 mg/dL — ABNORMAL HIGH (ref 70–99)
Potassium: 3 mmol/L — ABNORMAL LOW (ref 3.5–5.1)
Sodium: 133 mmol/L — ABNORMAL LOW (ref 135–145)
Total Bilirubin: 0.5 mg/dL (ref 0.3–1.2)
Total Protein: 7.1 g/dL (ref 6.5–8.1)

## 2021-04-05 LAB — CBC WITH DIFFERENTIAL (CANCER CENTER ONLY)
Abs Immature Granulocytes: 0.7 10*3/uL — ABNORMAL HIGH (ref 0.00–0.07)
Basophils Absolute: 0.1 10*3/uL (ref 0.0–0.1)
Basophils Relative: 1 %
Eosinophils Absolute: 0 10*3/uL (ref 0.0–0.5)
Eosinophils Relative: 0 %
HCT: 29 % — ABNORMAL LOW (ref 39.0–52.0)
Hemoglobin: 9.7 g/dL — ABNORMAL LOW (ref 13.0–17.0)
Immature Granulocytes: 10 %
Lymphocytes Relative: 11 %
Lymphs Abs: 0.8 10*3/uL (ref 0.7–4.0)
MCH: 26.4 pg (ref 26.0–34.0)
MCHC: 33.4 g/dL (ref 30.0–36.0)
MCV: 78.8 fL — ABNORMAL LOW (ref 80.0–100.0)
Monocytes Absolute: 2.3 10*3/uL — ABNORMAL HIGH (ref 0.1–1.0)
Monocytes Relative: 31 %
Neutro Abs: 3.4 10*3/uL (ref 1.7–7.7)
Neutrophils Relative %: 47 %
Platelet Count: 275 10*3/uL (ref 150–400)
RBC: 3.68 MIL/uL — ABNORMAL LOW (ref 4.22–5.81)
RDW: 19.3 % — ABNORMAL HIGH (ref 11.5–15.5)
WBC Count: 7.2 10*3/uL (ref 4.0–10.5)
nRBC: 0 % (ref 0.0–0.2)

## 2021-04-05 MED ORDER — POTASSIUM CHLORIDE CRYS ER 20 MEQ PO TBCR
20.0000 meq | EXTENDED_RELEASE_TABLET | Freq: Two times a day (BID) | ORAL | 0 refills | Status: DC
  Filled 2021-04-05: qty 20, 10d supply, fill #0

## 2021-04-05 NOTE — Progress Notes (Signed)
Nutrition ? ?RD planning to see patient during infusion today but infusion cancelled.  RD will follow-up as able ? ?Niyah Mamaril B. Zenia Resides, RD, LDN ?Registered Dietitian ?[phone] (mobile) ? ?

## 2021-04-05 NOTE — Progress Notes (Signed)
Wife notified to pick up [place] and for pt to start it. She will pick up kdur . ?" He never took the other potassium prescription" ?

## 2021-04-05 NOTE — Progress Notes (Signed)
?    [place] ?Telephone:([phone]   [phone] ? ?OFFICE PROGRESS NOTE ? ?[name], MD ?[address] ?[place] [address] ? ?DIAGNOSIS: Relapsed small cell lung cancer initially diagnosed as limited stage (T3, N2, M0) small cell lung cancer presented with pulmonary nodules and pleural-based nodules in the right lung in addition to mediastinal lymphadenopathy diagnosed in August 2021.  The patient was previously treated without biopsy to a pleural-based nodule in the right lower lobe with SBRT in March 2021 but had evidence for disease recurrence and progression.  He has disease relapse in June 2022. ? ?PRIOR THERAPY:  ?1) SBRT to right lower lobe pulmonary nodule in March 2021 under the care of Dr. Sondra Come. ?2) Systemic chemotherapy with carboplatin for AUC of 5 on day 1 and etoposide 100 mg/M2 on days 1, 2 and 3 with Neulasta support every 3 weeks.  First cycle 10/07/2019.  Status post 4 cycles.  Last dose of the chemotherapy was given on December 09, 2019. ?3) Second line systemic chemotherapy with carboplatin for AUC of 5 on day 1, etoposide 100 Mg/M2 on days 1, 2 and 3 with Cosela before the chemotherapy in addition to Imfinzi 1500 Mg IV on day 1 every 3 weeks.  First dose July 27, 2020.  Status post 9 cycles.  Cosela was discontinued secondary to phlebitis.  Starting from cycle #5 the patient will be on maintenance treatment with Imfinzi 1500 Mg IV every 4 weeks.  Last dose was given on February 08, 2021 discontinued secondary to disease progression ? ?CURRENT THERAPY: Zepzelca (lurbinectedin) 3.2 Mg/M2 every 3 weeks.  First dose March 15, 2021.  Status post 1 cycle. ? ?INTERVAL HISTORY: ?Jonathon [name] 71 y.o. male returns to the clinic today for follow-up visit accompanied by his wife [name].  The patient is feeling fine today with no concerning complaints except for increasing fatigue and weakness as well as a lot of her sleeping.  He was admitted  to [facility] long hospital 1 week after his first cycle of the chemotherapy with suspicious pneumonia but he responded quickly to treatment with antibiotics.  The patient has extensive emphysema that has been contributing to his respiratory distress.  He denied having any chest pain but has shortness of breath at baseline increased with exertion with mild cough and no hemoptysis.  He has no nausea, vomiting, diarrhea or constipation.  He has no headache or visual changes.  He was supposed to start cycle #2 of his treatment today. ?  ? ?MEDICAL HISTORY: ?Past Medical History:  ?Diagnosis Date  ? Cancer [facility])   ? CHF (congestive heart failure) ([place])   ? Chronic cough   ? COPD, severe ([place])   ? PT DENIES REGULAR DAILY INHALER ANY PRN  ? Coronary artery disease   ? CVA (cerebral vascular accident) ([place]) 02/22/2011  ? LEFT BRAIN STEM PONTINE HEMORRHAGE--  RIGHT SIDED WEAKNESS  ? Diabetes mellitus without complication ([place])   ? type 2  ? Dyspnea   ? GERD (gastroesophageal reflux disease)   ? Harsh voice quality   ? PT STATES NORMAL FOR HIM  ? High cholesterol   ? History of CHF (congestive heart failure) MONITORED BY DR Coletta Memos  ? DIASTOLIC  ? History of radiation therapy 03/13/19-03/20/19  ? SBRT Right Lung; Dr. Gery Pray  ? History of radiation therapy 02/17/20-02/28/20  ? Whole brain radiation; Dr. Gery Pray  ? History of radiation therapy 05/25/2020  ? 05/18/2020-05/25/2020  SBRT Right Lung; Dr. Gery Pray  ? Hydrocele, left   ? Hypertension   ? NSTEMI (non-ST elevated myocardial infarction) ([place]) 12/2015  ? Pre-operative clearance   ? GIVEN BY DR Coletta Memos  ? Short of breath on exertion   ? Smoker   ? Weakness of right side of body   ? S/P CVA   FEB 2013  ? ? ?ALLERGIES:  is allergic to pork-derived products and atorvastatin. ? ?MEDICATIONS:  ?Current Outpatient Medications  ?Medication Sig Dispense Refill  ? albuterol (PROVENTIL) (2.5 MG/3ML) 0.083% nebulizer solution Take 2.5 mg by nebulization every 4 (four)  hours as needed for wheezing or shortness of breath.    ? Albuterol Sulfate (PROAIR RESPICLICK) 371 (90 BASE) MCG/ACT AEPB Inhale 2 puffs into the lungs 4 (four) times daily as needed. (Patient taking differently: Inhale 2 puffs into the lungs 4 (four) times daily as needed (wheezing).) 1 each 5  ? amLODipine (NORVASC) 10 MG tablet TAKE 1 TABLET BY MOUTH ONCE A DAY (Patient taking differently: Take 10 mg by mouth every evening.) 90 tablet 3  ? atorvastatin (LIPITOR) 20 MG tablet Take one tablet by mouth daily. (Patient taking differently: Take 20 mg by mouth daily.) 30 tablet 5  ? azithromycin (ZITHROMAX) 500 MG tablet Take 1 tablet by mouth once a day.  Start in evening 03/23/21. 2 tablet 0  ? bisoprolol (ZEBETA) 5 MG tablet Take one tablet (5 mg dose) by mouth daily. (Patient taking differently: Take 5 mg by mouth every evening.) 90 tablet 3  ? cloNIDine (CATAPRES) 0.3 MG tablet TAKE 1 TABLET BY MOUTH TWICE DAILY 180 tablet 3  ? Cyanocobalamin (VITAMIN B 12 PO) Take 1 tablet by mouth every morning.     ? cyclobenzaprine (FLEXERIL) 5 MG tablet TAKE 1 TABLET BY MOUTH AT BEDTIME AS NEEDED FOR MUSCLE SPASMS (Patient taking differently: Take 5 mg by mouth at bedtime.) 30 tablet 3  ? DULoxetine (CYMBALTA) 30 MG capsule Take one capsule (30 mg dose) by mouth daily. (Patient taking differently: Take 30 mg by mouth at bedtime.) 90 capsule 3  ? gabapentin (NEURONTIN) 400 MG capsule Take 3 capsules (1,200 mg total) by mouth 2 (two) times daily.    ? gabapentin (NEURONTIN) 400 MG capsule TAKE 2 CAPSULES BY MOUTH THREE TIMES A DAY 180 capsule 7  ? losartan (COZAAR) 100 MG tablet TAKE 1 TABLET BY MOUTH DAILY. (Patient taking differently: Take 100 mg by mouth every evening.) 90 tablet 3  ? metFORMIN (GLUCOPHAGE) 500 MG tablet TAKE 1 TABLET BY MOUTH ONCE A DAY WITH BREAKFAST (Patient taking differently: Take 500 mg by mouth daily with breakfast.) 90 tablet 3  ? nitroGLYCERIN (NITROSTAT) 0.4 MG SL tablet PLACE 1 TABLET UNDER THE  TONGUE EVERY 5  MINUTES AS NEEDED AS DIRECTED. (Patient taking differently: 0.4 mg every 5 (five) minutes as needed for chest pain.) 25 tablet 5  ? nortriptyline (PAMELOR) 25 MG capsule TAKE 1 CAPSULE BY MOUTH EVERY NIGHT AT BEDTIME 90 capsule 0  ? pantoprazole (PROTONIX) 20 MG tablet TAKE 1 TABLET BY MOUTH DAILY (Patient taking differently: Take 20 mg by mouth every evening.) 90 tablet 2  ? polyethylene glycol (MIRALAX) 17 g packet Take 17 g by mouth daily as needed for mild constipation.    ? potassium chloride SA (KLOR-CON M) 20 MEQ tablet Take 1 tablet by mouth once daily. (Patient taking differently: Take 20 mEq by mouth every evening.) 10 tablet 0  ? prochlorperazine (COMPAZINE) 10 MG tablet Take 1  tablet (10 mg total) by mouth every 6 (six) hours as needed for nausea or vomiting. (Patient not taking: Reported on 03/20/2021) 30 tablet 0  ? senna (SENOKOT) 8.6 MG TABS tablet Take 1 tablet (8.6 mg total) by mouth at bedtime as needed for mild constipation. 120 tablet 0  ? SYMBICORT 160-4.5 MCG/ACT inhaler INHALE 2 PUFFS INTO THE LUNGS TWICE DAILY 10.2 g 11  ? tamsulosin (FLOMAX) 0.4 MG CAPS capsule Take one capsule (0.4 mg dose) by mouth daily. (Patient taking differently: Take 0.4 mg by mouth every evening.) 90 capsule 3  ? temazepam (RESTORIL) 15 MG capsule Take 15 mg by mouth at bedtime.    ? Tiotropium Bromide Monohydrate (SPIRIVA RESPIMAT) 2.5 MCG/ACT AERS Inhale two puffs into the lungs as needed. (Patient taking differently: Inhale 1 puff into the lungs 2 (two) times daily.) 4 g 11  ? ?No current facility-administered medications for this visit.  ? ? ?SURGICAL HISTORY:  ?Past Surgical History:  ?Procedure Laterality Date  ? ABDOMINAL ANGIOGRAM  09/04/2012  ? Procedure: ABDOMINAL ANGIOGRAM;  Surgeon: Laverda Page, MD;  Location: [facility] CATH LAB;  Service: Cardiovascular;;  ? CARDIAC CATHETERIZATION  05-02-2011  DR Johnsie Cancel  ? MILD NONOBSTRUCTIVE CAD/ LAD, OM , AND D1/ EF 60^  ? CARDIAC CATHETERIZATION  AUG  2000  ? NON-OBSTRUTIVE CAD/ EF 68%/ 25% PROXIMAL LAD/ 20% MID CIRCUMFLEX  ? CARDIAC CATHETERIZATION [name] 12/23/2015  ? Procedure: Left Heart Cath and Coronary Angiography;  Surgeon: Adrian Prows, MD;  Location: [facility]

## 2021-04-07 ENCOUNTER — Telehealth: Payer: Self-pay | Admitting: *Deleted

## 2021-04-07 NOTE — Telephone Encounter (Signed)
CALLED PATIENT TO INFORM OF MRI FOR 04-14-21- ARRIVAL TIME- 8:30 AM  @ [facility], NO RESTRICTIONS TO TEST, FU APPT. WITH DR. KINARD FOR RESULTS ON 04-19-21 ?@ 3 PM FOR RESULTS, LVM FOR A RETURN CALL  ?

## 2021-04-09 ENCOUNTER — Telehealth: Payer: Self-pay | Admitting: Internal Medicine

## 2021-04-09 NOTE — Telephone Encounter (Signed)
Scheduled per 03/20 los, patient has been called and notified of upcoming appointments. ?

## 2021-04-12 ENCOUNTER — Inpatient Hospital Stay (HOSPITAL_BASED_OUTPATIENT_CLINIC_OR_DEPARTMENT_OTHER): Payer: Medicare Other | Admitting: Internal Medicine

## 2021-04-12 ENCOUNTER — Other Ambulatory Visit: Payer: Self-pay

## 2021-04-12 ENCOUNTER — Inpatient Hospital Stay: Payer: Medicare Other

## 2021-04-12 ENCOUNTER — Encounter: Payer: Self-pay | Admitting: Internal Medicine

## 2021-04-12 ENCOUNTER — Other Ambulatory Visit (HOSPITAL_COMMUNITY): Payer: Self-pay

## 2021-04-12 VITALS — BP 117/68 | HR 68 | Temp 97.5°F | Resp 18 | Ht 72.0 in | Wt 181.2 lb

## 2021-04-12 DIAGNOSIS — C3491 Malignant neoplasm of unspecified part of right bronchus or lung: Secondary | ICD-10-CM

## 2021-04-12 DIAGNOSIS — Z5111 Encounter for antineoplastic chemotherapy: Secondary | ICD-10-CM

## 2021-04-12 LAB — CMP (CANCER CENTER ONLY)
ALT: 11 U/L (ref 0–44)
AST: 11 U/L — ABNORMAL LOW (ref 15–41)
Albumin: 3.4 g/dL — ABNORMAL LOW (ref 3.5–5.0)
Alkaline Phosphatase: 85 U/L (ref 38–126)
Anion gap: 8 (ref 5–15)
BUN: 8 mg/dL (ref 8–23)
CO2: 24 mmol/L (ref 22–32)
Calcium: 8.8 mg/dL — ABNORMAL LOW (ref 8.9–10.3)
Chloride: 100 mmol/L (ref 98–111)
Creatinine: 0.96 mg/dL (ref 0.61–1.24)
GFR, Estimated: >60 mL/min (ref >60)
Glucose, Bld: 114 mg/dL — ABNORMAL HIGH (ref 70–99)
Potassium: 3.8 mmol/L (ref 3.5–5.1)
Sodium: 132 mmol/L — ABNORMAL LOW (ref 135–145)
Total Bilirubin: 0.5 mg/dL (ref 0.3–1.2)
Total Protein: 7.3 g/dL (ref 6.5–8.1)

## 2021-04-12 LAB — CBC WITH DIFFERENTIAL (CANCER CENTER ONLY)
Abs Immature Granulocytes: 0.05 10*3/uL (ref 0.00–0.07)
Basophils Absolute: 0 10*3/uL (ref 0.0–0.1)
Basophils Relative: 1 %
Eosinophils Absolute: 0 10*3/uL (ref 0.0–0.5)
Eosinophils Relative: 0 %
HCT: 30.6 % — ABNORMAL LOW (ref 39.0–52.0)
Hemoglobin: 9.9 g/dL — ABNORMAL LOW (ref 13.0–17.0)
Immature Granulocytes: 1 %
Lymphocytes Relative: 12 %
Lymphs Abs: 0.9 10*3/uL (ref 0.7–4.0)
MCH: 26.3 pg (ref 26.0–34.0)
MCHC: 32.4 g/dL (ref 30.0–36.0)
MCV: 81.4 fL (ref 80.0–100.0)
Monocytes Absolute: 0.9 10*3/uL (ref 0.1–1.0)
Monocytes Relative: 13 %
Neutro Abs: 5.2 10*3/uL (ref 1.7–7.7)
Neutrophils Relative %: 73 %
Platelet Count: 366 10*3/uL (ref 150–400)
RBC: 3.76 MIL/uL — ABNORMAL LOW (ref 4.22–5.81)
RDW: 20.5 % — ABNORMAL HIGH (ref 11.5–15.5)
WBC Count: 7 10*3/uL (ref 4.0–10.5)
nRBC: 0 % (ref 0.0–0.2)

## 2021-04-12 MED ORDER — PALONOSETRON HCL INJECTION 0.25 MG/5ML
0.2500 mg | Freq: Once | INTRAVENOUS | Status: AC
  Administered 2021-04-12: 0.25 mg via INTRAVENOUS
  Filled 2021-04-12: qty 5

## 2021-04-12 MED ORDER — SODIUM CHLORIDE 0.9 % IV SOLN: Freq: Once | INTRAVENOUS | Status: AC

## 2021-04-12 MED ORDER — METHYLPREDNISOLONE 4 MG PO TBPK
ORAL_TABLET | ORAL | 0 refills | Status: AC
  Filled 2021-04-12: qty 21, 6d supply, fill #0

## 2021-04-12 MED ORDER — SODIUM CHLORIDE 0.9 % IV SOLN
10.0000 mg | Freq: Once | INTRAVENOUS | Status: AC
  Administered 2021-04-12: 10 mg via INTRAVENOUS
  Filled 2021-04-12: qty 10

## 2021-04-12 MED ORDER — SODIUM CHLORIDE 0.9 % IV SOLN
3.2000 mg/m2 | Freq: Once | INTRAVENOUS | Status: AC
  Administered 2021-04-12: 6.65 mg via INTRAVENOUS
  Filled 2021-04-12: qty 13.3

## 2021-04-12 NOTE — Progress Notes (Signed)
?    [place] ?Telephone:(336) [phone]   [phone] ? ?OFFICE PROGRESS NOTE ? ?[name], MD ?[address] ?[place] [address] ? ?DIAGNOSIS: Relapsed small cell lung cancer initially diagnosed as limited stage (T3, N2, M0) small cell lung cancer presented with pulmonary nodules and pleural-based nodules in the right lung in addition to mediastinal lymphadenopathy diagnosed in August 2021.  The patient was previously treated without biopsy to a pleural-based nodule in the right lower lobe with SBRT in March 2021 but had evidence for disease recurrence and progression.  He has disease relapse in June 2022. ? ?PRIOR THERAPY:  ?1) SBRT to right lower lobe pulmonary nodule in March 2021 under the care of Dr. Sondra Come. ?2) Systemic chemotherapy with carboplatin for AUC of 5 on day 1 and etoposide 100 mg/M2 on days 1, 2 and 3 with Neulasta support every 3 weeks.  First cycle 10/07/2019.  Status post 4 cycles.  Last dose of the chemotherapy was given on December 09, 2019. ?3) Second line systemic chemotherapy with carboplatin for AUC of 5 on day 1, etoposide 100 Mg/M2 on days 1, 2 and 3 with Cosela before the chemotherapy in addition to Imfinzi 1500 Mg IV on day 1 every 3 weeks.  First dose July 27, 2020.  Status post 9 cycles.  Cosela was discontinued secondary to phlebitis.  Starting from cycle #5 the patient will be on maintenance treatment with Imfinzi 1500 Mg IV every 4 weeks.  Last dose was given on February 08, 2021 discontinued secondary to disease progression ? ?CURRENT THERAPY: Zepzelca (lurbinectedin) 3.2 Mg/M2 every 3 weeks.  First dose March 15, 2021.  Status post 1 cycle. ? ?INTERVAL HISTORY: ?Jonathon [name] 71 y.o. male returns to the clinic today for follow-up visit accompanied by his wife [name].  The patient is feeling fine today with no concerning complaints.  He had several days of fatigue and weakness after the first cycle of his treatment.  He  also sleeps a lot recently.  He denied having any current chest pain but continues to have the shortness of breath at baseline increased with exertion with mild cough and no hemoptysis.  He has no nausea, vomiting, diarrhea or constipation.  He denied having any headache or visual changes.  He was treated for pneumonia after the first cycle of his treatment. ?He is here today for evaluation before starting cycle #2 of his treatment. ?  ? ?MEDICAL HISTORY: ?Past Medical History:  ?Diagnosis Date  ? Cancer [facility])   ? CHF (congestive heart failure) ([place])   ? Chronic cough   ? COPD, severe ([place])   ? PT DENIES REGULAR DAILY INHALER ANY PRN  ? Coronary artery disease   ? CVA (cerebral vascular accident) ([place]) 02/22/2011  ? LEFT BRAIN STEM PONTINE HEMORRHAGE--  RIGHT SIDED WEAKNESS  ? Diabetes mellitus without complication ([place])   ? type 2  ? Dyspnea   ? GERD (gastroesophageal reflux disease)   ? Harsh voice quality   ? PT STATES NORMAL FOR HIM  ? High cholesterol   ? History of CHF (congestive heart failure) MONITORED BY DR Coletta Memos  ? DIASTOLIC  ? History of radiation therapy 03/13/19-03/20/19  ? SBRT Right Lung; Dr. Gery Pray  ? History of radiation therapy 02/17/20-02/28/20  ? Whole brain radiation; Dr. Gery Pray  ? History of radiation therapy 05/25/2020  ? 05/18/2020-05/25/2020  SBRT Right Lung; Dr. Gery Pray  ? Hydrocele, left   ? Hypertension   ?  NSTEMI (non-ST elevated myocardial infarction) ([place]) 12/2015  ? Pre-operative clearance   ? GIVEN BY DR Coletta Memos  ? Short of breath on exertion   ? Smoker   ? Weakness of right side of body   ? S/P CVA   FEB 2013  ? ? ?ALLERGIES:  is allergic to pork-derived products and atorvastatin. ? ?MEDICATIONS:  ?Current Outpatient Medications  ?Medication Sig Dispense Refill  ? albuterol (PROVENTIL) (2.5 MG/3ML) 0.083% nebulizer solution Take 2.5 mg by nebulization every 4 (four) hours as needed for wheezing or shortness of breath.    ? Albuterol Sulfate (PROAIR RESPICLICK) 109  (90 BASE) MCG/ACT AEPB Inhale 2 puffs into the lungs 4 (four) times daily as needed. (Patient taking differently: Inhale 2 puffs into the lungs 4 (four) times daily as needed (wheezing).) 1 each 5  ? amLODipine (NORVASC) 10 MG tablet TAKE 1 TABLET BY MOUTH ONCE A DAY (Patient taking differently: Take 10 mg by mouth every evening.) 90 tablet 3  ? atorvastatin (LIPITOR) 20 MG tablet Take one tablet by mouth daily. (Patient taking differently: Take 20 mg by mouth daily.) 30 tablet 5  ? bisoprolol (ZEBETA) 5 MG tablet Take one tablet (5 mg dose) by mouth daily. (Patient taking differently: Take 5 mg by mouth every evening.) 90 tablet 3  ? cloNIDine (CATAPRES) 0.3 MG tablet TAKE 1 TABLET BY MOUTH TWICE DAILY 180 tablet 3  ? Cyanocobalamin (VITAMIN B 12 PO) Take 1 tablet by mouth every morning.     ? cyclobenzaprine (FLEXERIL) 5 MG tablet TAKE 1 TABLET BY MOUTH AT BEDTIME AS NEEDED FOR MUSCLE SPASMS (Patient not taking: Reported on 04/05/2021) 30 tablet 3  ? DULoxetine (CYMBALTA) 30 MG capsule Take one capsule (30 mg dose) by mouth daily. (Patient taking differently: Take 30 mg by mouth at bedtime.) 90 capsule 3  ? gabapentin (NEURONTIN) 400 MG capsule TAKE 2 CAPSULES BY MOUTH THREE TIMES A DAY 180 capsule 7  ? losartan (COZAAR) 100 MG tablet TAKE 1 TABLET BY MOUTH DAILY. (Patient taking differently: Take 100 mg by mouth every evening.) 90 tablet 3  ? metFORMIN (GLUCOPHAGE) 500 MG tablet TAKE 1 TABLET BY MOUTH ONCE A DAY WITH BREAKFAST (Patient taking differently: Take 500 mg by mouth daily with breakfast.) 90 tablet 3  ? nitroGLYCERIN (NITROSTAT) 0.4 MG SL tablet PLACE 1 TABLET UNDER THE TONGUE EVERY 5  MINUTES AS NEEDED AS DIRECTED. (Patient taking differently: 0.4 mg every 5 (five) minutes as needed for chest pain.) 25 tablet 5  ? nortriptyline (PAMELOR) 25 MG capsule TAKE 1 CAPSULE BY MOUTH EVERY NIGHT AT BEDTIME 90 capsule 0  ? pantoprazole (PROTONIX) 20 MG tablet TAKE 1 TABLET BY MOUTH DAILY (Patient taking  differently: Take 20 mg by mouth every evening.) 90 tablet 2  ? polyethylene glycol (MIRALAX) 17 g packet Take 17 g by mouth daily as needed for mild constipation.    ? potassium chloride SA (KLOR-CON M) 20 MEQ tablet Take 1 tablet (20 mEq total) by mouth 2 (two) times daily. 20 tablet 0  ? prochlorperazine (COMPAZINE) 10 MG tablet Take 1 tablet (10 mg total) by mouth every 6 (six) hours as needed for nausea or vomiting. (Patient not taking: Reported on 03/20/2021) 30 tablet 0  ? senna (SENOKOT) 8.6 MG TABS tablet Take 1 tablet (8.6 mg total) by mouth at bedtime as needed for mild constipation. 120 tablet 0  ? SYMBICORT 160-4.5 MCG/ACT inhaler INHALE 2 PUFFS INTO THE LUNGS TWICE DAILY 10.2 g 11  ?  temazepam (RESTORIL) 15 MG capsule Take 15 mg by mouth at bedtime.    ? Tiotropium Bromide Monohydrate (SPIRIVA RESPIMAT) 2.5 MCG/ACT AERS Inhale two puffs into the lungs as needed. (Patient taking differently: Inhale 1 puff into the lungs 2 (two) times daily.) 4 g 11  ? ?No current facility-administered medications for this visit.  ? ? ?SURGICAL HISTORY:  ?Past Surgical History:  ?Procedure Laterality Date  ? ABDOMINAL ANGIOGRAM  09/04/2012  ? Procedure: ABDOMINAL ANGIOGRAM;  Surgeon: Laverda Page, MD;  Location: [facility] CATH LAB;  Service: Cardiovascular;;  ? CARDIAC CATHETERIZATION  05-02-2011  DR Johnsie Cancel  ? MILD NONOBSTRUCTIVE CAD/ LAD, OM , AND D1/ EF 60^  ? CARDIAC CATHETERIZATION  AUG 2000  ? NON-OBSTRUTIVE CAD/ EF 68%/ 25% PROXIMAL LAD/ 20% MID CIRCUMFLEX  ? CARDIAC CATHETERIZATION [name] 12/23/2015  ? Procedure: Left Heart Cath and Coronary Angiography;  Surgeon: Adrian Prows, MD;  Location: [place] CV LAB;  Service: Cardiovascular;  Laterality: [name];  ? CARDIAC CATHETERIZATION [name] 12/24/2015  ? Procedure: Coronary Stent Intervention;  Surgeon: Adrian Prows, MD;  Location: [place] CV LAB;  Service: Cardiovascular;  Laterality: [name];  ? CORONARY ANGIOPLASTY    ? CORONARY STENT PLACEMENT  12/24/2015  ?  PTCA and stenting of  the distal RCA   ? HYDROCELE EXCISION Left 04/24/2012  ? Procedure: HYDROCELECTOMY ADULT;  Surgeon: Fredricka Bonine, MD;  Location: [facility];  Service: Urology;  Laterality: Left;  90 mins

## 2021-04-12 NOTE — Patient Instructions (Signed)
[employer]  Discharge Instructions: ?Thank you for choosing [facility] to provide your oncology and hematology care.  ? ?If you have a lab appointment with the [facility], please go directly to the [facility] and check in at the registration area. ?  ?Wear comfortable clothing and clothing appropriate for easy access to any Portacath or PICC line.  ? ?We strive to give you quality time with your provider. You may need to reschedule your appointment if you arrive late (15 or more minutes).  Arriving late affects you and other patients whose appointments are after yours.  Also, if you miss three or more appointments without notifying the office, you may be dismissed from the clinic at the provider?s discretion.    ?  ?For prescription refill requests, have your pharmacy contact our office and allow 72 hours for refills to be completed.   ? ?Today you received the following chemotherapy and/or immunotherapy agents: Lurbinectedin (Zepzelca)  ?  ?To help prevent nausea and vomiting after your treatment, we encourage you to take your nausea medication as directed. ? ?BELOW ARE SYMPTOMS THAT SHOULD BE REPORTED IMMEDIATELY: ?*FEVER GREATER THAN 100.4 F (38 ?C) OR HIGHER ?*CHILLS OR SWEATING ?*NAUSEA AND VOMITING THAT IS NOT CONTROLLED WITH YOUR NAUSEA MEDICATION ?*UNUSUAL SHORTNESS OF BREATH ?*UNUSUAL BRUISING OR BLEEDING ?*URINARY PROBLEMS (pain or burning when urinating, or frequent urination) ?*BOWEL PROBLEMS (unusual diarrhea, constipation, pain near the anus) ?TENDERNESS IN MOUTH AND THROAT WITH OR WITHOUT PRESENCE OF ULCERS (sore throat, sores in mouth, or a toothache) ?UNUSUAL RASH, SWELLING OR PAIN  ?UNUSUAL VAGINAL DISCHARGE OR ITCHING  ? ?Items with * indicate a potential emergency and should be followed up as soon as possible or go to the Emergency Department if any problems should occur. ? ?Please show the CHEMOTHERAPY ALERT CARD or IMMUNOTHERAPY ALERT CARD at  check-in to the Emergency Department and triage nurse. ? ?Should you have questions after your visit or need to cancel or reschedule your appointment, please contact [facility]  Dept: [phone]  and follow the prompts.  Office hours are 8:00 a.m. to 4:30 p.m. Monday - Friday. Please note that voicemails left after 4:00 p.m. may not be returned until the following business day.  We are closed weekends and major holidays. You have access to a nurse at all times for urgent questions. Please call the main number to the clinic Dept: [phone] and follow the prompts. ? ? ?For any non-urgent questions, you may also contact your provider using MyChart. We now offer e-Visits for anyone 72 and older to request care online for non-urgent symptoms. For details visit [email or web address]. ?  ?Also download the MyChart app! Go to the app store, search "MyChart", open the app, select [facility], and log in with your MyChart username and password. ? ?Due to Covid, a mask is required upon entering the hospital/clinic. If you do not have a mask, one will be given to you upon arrival. For doctor visits, patients may have 1 support person aged 72 or older with them. For treatment visits, patients cannot have anyone with them due to current Covid guidelines and our immunocompromised population.  ? ?

## 2021-04-14 ENCOUNTER — Telehealth: Payer: Self-pay | Admitting: *Deleted

## 2021-04-14 ENCOUNTER — Ambulatory Visit (HOSPITAL_COMMUNITY): Admission: RE | Admit: 2021-04-14 | Payer: Medicare Other | Source: Ambulatory Visit

## 2021-04-14 NOTE — Telephone Encounter (Signed)
CALLED PATIENT TO RESCHEDULE MRI FOR 04-14-21 DUE TO BEING SICK, MRI RESCHEDULED FOR 04-21-21 - ARRIVAL TIME- 2:30 PM @ [facility], NO RESTRICTIONS TO TEST, PATIENT TO RECEIVE RESULTS FROM DR. KINARD ON 04-29-21 @ 4:15 PM, SPOKE WITH PATIENT'S WIFE- JANET AND SHE IS AWARE OF THESE APPTS. ?

## 2021-04-15 LAB — MISC LABCORP TEST (SEND OUT): Labcorp test code: 183402

## 2021-04-19 ENCOUNTER — Ambulatory Visit: Payer: Medicare Other | Admitting: Radiation Oncology

## 2021-04-20 ENCOUNTER — Inpatient Hospital Stay: Payer: Medicare Other | Attending: Internal Medicine

## 2021-04-20 ENCOUNTER — Other Ambulatory Visit (HOSPITAL_COMMUNITY): Payer: Self-pay

## 2021-04-20 ENCOUNTER — Other Ambulatory Visit: Payer: Self-pay

## 2021-04-20 DIAGNOSIS — C3431 Malignant neoplasm of lower lobe, right bronchus or lung: Secondary | ICD-10-CM | POA: Insufficient documentation

## 2021-04-20 DIAGNOSIS — I6782 Cerebral ischemia: Secondary | ICD-10-CM | POA: Insufficient documentation

## 2021-04-20 DIAGNOSIS — Z5189 Encounter for other specified aftercare: Secondary | ICD-10-CM | POA: Diagnosis not present

## 2021-04-20 DIAGNOSIS — Z79899 Other long term (current) drug therapy: Secondary | ICD-10-CM | POA: Diagnosis not present

## 2021-04-20 DIAGNOSIS — Z8673 Personal history of transient ischemic attack (TIA), and cerebral infarction without residual deficits: Secondary | ICD-10-CM | POA: Insufficient documentation

## 2021-04-20 DIAGNOSIS — Z7984 Long term (current) use of oral hypoglycemic drugs: Secondary | ICD-10-CM | POA: Insufficient documentation

## 2021-04-20 DIAGNOSIS — Z923 Personal history of irradiation: Secondary | ICD-10-CM | POA: Diagnosis not present

## 2021-04-20 DIAGNOSIS — C3491 Malignant neoplasm of unspecified part of right bronchus or lung: Secondary | ICD-10-CM

## 2021-04-20 DIAGNOSIS — Z7951 Long term (current) use of inhaled steroids: Secondary | ICD-10-CM | POA: Insufficient documentation

## 2021-04-20 DIAGNOSIS — C771 Secondary and unspecified malignant neoplasm of intrathoracic lymph nodes: Secondary | ICD-10-CM | POA: Insufficient documentation

## 2021-04-20 DIAGNOSIS — R630 Anorexia: Secondary | ICD-10-CM | POA: Insufficient documentation

## 2021-04-20 DIAGNOSIS — Z5111 Encounter for antineoplastic chemotherapy: Secondary | ICD-10-CM | POA: Insufficient documentation

## 2021-04-20 LAB — CBC WITH DIFFERENTIAL (CANCER CENTER ONLY)
Abs Immature Granulocytes: 0.02 10*3/uL (ref 0.00–0.07)
Basophils Absolute: 0 10*3/uL (ref 0.0–0.1)
Basophils Relative: 1 %
Eosinophils Absolute: 0 10*3/uL (ref 0.0–0.5)
Eosinophils Relative: 1 %
HCT: 25.2 % — ABNORMAL LOW (ref 39.0–52.0)
Hemoglobin: 8.4 g/dL — ABNORMAL LOW (ref 13.0–17.0)
Immature Granulocytes: 2 %
Lymphocytes Relative: 34 %
Lymphs Abs: 0.4 10*3/uL — ABNORMAL LOW (ref 0.7–4.0)
MCH: 27 pg (ref 26.0–34.0)
MCHC: 33.3 g/dL (ref 30.0–36.0)
MCV: 81 fL (ref 80.0–100.0)
Monocytes Absolute: 0 10*3/uL — ABNORMAL LOW (ref 0.1–1.0)
Monocytes Relative: 3 %
Neutro Abs: 0.6 10*3/uL — ABNORMAL LOW (ref 1.7–7.7)
Neutrophils Relative %: 59 %
Platelet Count: 69 10*3/uL — ABNORMAL LOW (ref 150–400)
RBC: 3.11 MIL/uL — ABNORMAL LOW (ref 4.22–5.81)
RDW: 20.2 % — ABNORMAL HIGH (ref 11.5–15.5)
Smear Review: NORMAL
WBC Count: 1 10*3/uL — ABNORMAL LOW (ref 4.0–10.5)
nRBC: 0 % (ref 0.0–0.2)

## 2021-04-20 LAB — CMP (CANCER CENTER ONLY)
ALT: 28 U/L (ref 0–44)
AST: 24 U/L (ref 15–41)
Albumin: 3.3 g/dL — ABNORMAL LOW (ref 3.5–5.0)
Alkaline Phosphatase: 68 U/L (ref 38–126)
Anion gap: 6 (ref 5–15)
BUN: 18 mg/dL (ref 8–23)
CO2: 21 mmol/L — ABNORMAL LOW (ref 22–32)
Calcium: 8.3 mg/dL — ABNORMAL LOW (ref 8.9–10.3)
Chloride: 104 mmol/L (ref 98–111)
Creatinine: 1.07 mg/dL (ref 0.61–1.24)
GFR, Estimated: >60 mL/min (ref >60)
Glucose, Bld: 145 mg/dL — ABNORMAL HIGH (ref 70–99)
Potassium: 4.3 mmol/L (ref 3.5–5.1)
Sodium: 131 mmol/L — ABNORMAL LOW (ref 135–145)
Total Bilirubin: 0.5 mg/dL (ref 0.3–1.2)
Total Protein: 6.9 g/dL (ref 6.5–8.1)

## 2021-04-20 LAB — CULTURE, BLOOD (ROUTINE X 2): Special Requests: ADEQUATE

## 2021-04-21 ENCOUNTER — Other Ambulatory Visit (HOSPITAL_COMMUNITY): Payer: Self-pay

## 2021-04-21 ENCOUNTER — Telehealth: Payer: Self-pay | Admitting: Internal Medicine

## 2021-04-21 ENCOUNTER — Inpatient Hospital Stay: Payer: Medicare Other

## 2021-04-21 ENCOUNTER — Other Ambulatory Visit: Payer: Self-pay | Admitting: Physician Assistant

## 2021-04-21 ENCOUNTER — Ambulatory Visit (HOSPITAL_COMMUNITY)
Admission: RE | Admit: 2021-04-21 | Discharge: 2021-04-21 | Disposition: A | Discharge status: Home / Self Care | Payer: Medicare Other | Source: Ambulatory Visit | Attending: Radiation Oncology | Admitting: Radiation Oncology

## 2021-04-21 VITALS — BP 103/58 | HR 76 | Temp 98.9°F | Resp 18

## 2021-04-21 DIAGNOSIS — C3491 Malignant neoplasm of unspecified part of right bronchus or lung: Secondary | ICD-10-CM | POA: Diagnosis present

## 2021-04-21 MED ORDER — FILGRASTIM-SNDZ 480 MCG/0.8ML IJ SOSY
480.0000 ug | PREFILLED_SYRINGE | Freq: Once | INTRAMUSCULAR | Status: AC
  Administered 2021-04-21: 480 ug via SUBCUTANEOUS
  Filled 2021-04-21: qty 0.8

## 2021-04-21 MED ORDER — GADOBUTROL 1 MMOL/ML IV SOLN
8.0000 mL | Freq: Once | INTRAVENOUS | Status: AC | PRN
  Administered 2021-04-21: 8 mL via INTRAVENOUS

## 2021-04-21 NOTE — Telephone Encounter (Signed)
.  Called patient to schedule appointment per 4/5 inbasket, patient wife is aware of date and time.   ?

## 2021-04-22 ENCOUNTER — Other Ambulatory Visit: Payer: Self-pay

## 2021-04-22 ENCOUNTER — Inpatient Hospital Stay: Payer: Medicare Other

## 2021-04-22 VITALS — BP 105/63 | HR 73 | Temp 98.4°F | Resp 18

## 2021-04-22 DIAGNOSIS — C3490 Malignant neoplasm of unspecified part of unspecified bronchus or lung: Secondary | ICD-10-CM

## 2021-04-22 DIAGNOSIS — T451X5A Adverse effect of antineoplastic and immunosuppressive drugs, initial encounter: Secondary | ICD-10-CM

## 2021-04-22 DIAGNOSIS — Z5111 Encounter for antineoplastic chemotherapy: Secondary | ICD-10-CM | POA: Diagnosis not present

## 2021-04-22 MED ORDER — FILGRASTIM-SNDZ 480 MCG/0.8ML IJ SOSY
480.0000 ug | PREFILLED_SYRINGE | Freq: Once | INTRAMUSCULAR | Status: AC
  Administered 2021-04-22: 480 ug via SUBCUTANEOUS
  Filled 2021-04-22: qty 0.8

## 2021-04-23 ENCOUNTER — Other Ambulatory Visit: Payer: Self-pay

## 2021-04-23 ENCOUNTER — Emergency Department (HOSPITAL_COMMUNITY): Payer: Medicare Other

## 2021-04-23 ENCOUNTER — Emergency Department (HOSPITAL_COMMUNITY)
Admission: EM | Admit: 2021-04-23 | Discharge: 2021-04-23 | Disposition: A | Discharge status: Home / Self Care | Payer: Medicare Other | Attending: Emergency Medicine | Admitting: Emergency Medicine

## 2021-04-23 ENCOUNTER — Encounter (HOSPITAL_COMMUNITY): Payer: Self-pay

## 2021-04-23 DIAGNOSIS — Z79899 Other long term (current) drug therapy: Secondary | ICD-10-CM | POA: Insufficient documentation

## 2021-04-23 DIAGNOSIS — W010XXA Fall on same level from slipping, tripping and stumbling without subsequent striking against object, initial encounter: Secondary | ICD-10-CM | POA: Diagnosis not present

## 2021-04-23 DIAGNOSIS — I509 Heart failure, unspecified: Secondary | ICD-10-CM | POA: Diagnosis not present

## 2021-04-23 DIAGNOSIS — J449 Chronic obstructive pulmonary disease, unspecified: Secondary | ICD-10-CM | POA: Diagnosis not present

## 2021-04-23 DIAGNOSIS — S6992XA Unspecified injury of left wrist, hand and finger(s), initial encounter: Secondary | ICD-10-CM | POA: Diagnosis present

## 2021-04-23 DIAGNOSIS — I2510 Atherosclerotic heart disease of native coronary artery without angina pectoris: Secondary | ICD-10-CM | POA: Insufficient documentation

## 2021-04-23 DIAGNOSIS — Z85118 Personal history of other malignant neoplasm of bronchus and lung: Secondary | ICD-10-CM | POA: Insufficient documentation

## 2021-04-23 MED ORDER — ONDANSETRON 8 MG PO TBDP
8.0000 mg | ORAL_TABLET | Freq: Once | ORAL | Status: AC
  Administered 2021-04-23: 8 mg via ORAL
  Filled 2021-04-23: qty 1

## 2021-04-23 MED ORDER — ONDANSETRON HCL 8 MG PO TABS: 4.0000 mg | ORAL_TABLET | Freq: Four times a day (QID) | ORAL | 0 refills | Status: AC

## 2021-04-23 NOTE — Discharge Instructions (Addendum)
Please pick up your Zofran ?Please get finger buddy taped in position of comfort.  You can use ice or heating pads for pain relief.  You may also take ibuprofen. ?

## 2021-04-23 NOTE — ED Triage Notes (Signed)
Pt has a finger injury to his left small finger up under his nail. Pt also reports loss of appetite while undergoing tx for his lung cancer.  ?

## 2021-04-23 NOTE — ED Provider Notes (Signed)
?[facility] DEPT ?Provider Note ? ? ?CSN: [id number] ?Arrival date & time: 04/23/21  1947 ? ?  ? ?History ? ?Chief Complaint  ?Patient presents with  ? Finger Injury  ? ? ?Jonathon [name] is a 71 y.o. male with history of CHF, COPD, coronary artery disease, lung cancer.  Patient presents to ED for evaluation of pinky finger pain.  Patient states that earlier today he was walking to go to the bathroom when he tripped over the carpet and reached out to grab a table injuring his left pinky finger.  Patient denies striking his head or being on blood thinners.  Patient states that he is able to fully bend the finger, there is no laceration.  Patient also requesting refill of his Zofran medication because he ran out recently. ? ?HPI ? ?  ? ?Home Medications ?Prior to Admission medications   ?Medication Sig Start Date End Date Taking? Authorizing Provider  ?ondansetron (ZOFRAN) 8 MG tablet Take 0.5 tablets (4 mg total) by mouth every 6 (six) hours. 04/23/21  Yes Azucena Cecil, PA-C  ?albuterol (PROVENTIL) (2.5 MG/3ML) 0.083% nebulizer solution Take 2.5 mg by nebulization every 4 (four) hours as needed for wheezing or shortness of breath.    [provider]  ?Albuterol Sulfate (PROAIR RESPICLICK) 409 (90 BASE) MCG/ACT AEPB Inhale 2 puffs into the lungs 4 (four) times daily as needed. ?Patient taking differently: Inhale 2 puffs into the lungs 4 (four) times daily as needed (wheezing). 02/24/14   Parrett, Fonnie Mu, NP  ?amLODipine (NORVASC) 10 MG tablet TAKE 1 TABLET BY MOUTH ONCE A DAY ?Patient taking differently: Take 10 mg by mouth every evening. 02/15/21     ?atorvastatin (LIPITOR) 20 MG tablet Take one tablet by mouth daily. ?Patient taking differently: Take 20 mg by mouth daily. 02/23/21     ?bisoprolol (ZEBETA) 5 MG tablet Take one tablet (5 mg dose) by mouth daily. ?Patient taking differently: Take 5 mg by mouth every evening. 11/12/20     ?cloNIDine (CATAPRES) 0.3 MG tablet  TAKE 1 TABLET BY MOUTH TWICE DAILY 05/19/20     ?Cyanocobalamin (VITAMIN B 12 PO) Take 1 tablet by mouth every morning.     [provider]  ?cyclobenzaprine (FLEXERIL) 5 MG tablet TAKE 1 TABLET BY MOUTH AT BEDTIME AS NEEDED FOR MUSCLE SPASMS ?Patient not taking: Reported on 04/05/2021 12/24/20     ?DULoxetine (CYMBALTA) 30 MG capsule Take one capsule (30 mg dose) by mouth daily. ?Patient taking differently: Take 30 mg by mouth at bedtime. 11/12/20     ?gabapentin (NEURONTIN) 400 MG capsule TAKE 2 CAPSULES BY MOUTH THREE TIMES A DAY 03/30/21     ?losartan (COZAAR) 100 MG tablet TAKE 1 TABLET BY MOUTH DAILY. ?Patient taking differently: Take 100 mg by mouth every evening. 01/12/21 01/12/22  Adrian Prows, MD  ?metFORMIN (GLUCOPHAGE) 500 MG tablet TAKE 1 TABLET BY MOUTH ONCE A DAY WITH BREAKFAST ?Patient taking differently: Take 500 mg by mouth daily with breakfast. 05/19/20     ?methylPREDNISolone (MEDROL DOSEPAK) 4 MG TBPK tablet Use as instructed. 04/12/21   [name], MD  ?nitroGLYCERIN (NITROSTAT) 0.4 MG SL tablet PLACE 1 TABLET UNDER THE TONGUE EVERY 5  MINUTES AS NEEDED AS DIRECTED. ?Patient taking differently: 0.4 mg every 5 (five) minutes as needed for chest pain. 11/16/20 11/16/21  Adrian Prows, MD  ?nortriptyline (PAMELOR) 25 MG capsule TAKE 1 CAPSULE BY MOUTH EVERY NIGHT AT BEDTIME 03/16/21     ?pantoprazole (PROTONIX) 20 MG  tablet TAKE 1 TABLET BY MOUTH DAILY ?Patient taking differently: Take 20 mg by mouth every evening. 03/15/21 03/15/22  Adrian Prows, MD  ?polyethylene glycol (MIRALAX) 17 g packet Take 17 g by mouth daily as needed for mild constipation. 03/23/21   Samuella Cota, MD  ?potassium chloride SA (KLOR-CON M) 20 MEQ tablet Take 1 tablet (20 mEq total) by mouth 2 (two) times daily. 04/05/21   [name], MD  ?prochlorperazine (COMPAZINE) 10 MG tablet Take 1 tablet (10 mg total) by mouth every 6 (six) hours as needed for nausea or vomiting. ?Patient not taking: Reported on 03/20/2021 09/26/19    [name], MD  ?senna (SENOKOT) 8.6 MG TABS tablet Take 1 tablet (8.6 mg total) by mouth at bedtime as needed for mild constipation. 03/23/21   Samuella Cota, MD  ?Dellis Anes 160-4.5 MCG/ACT inhaler INHALE 2 PUFFS INTO THE LUNGS TWICE DAILY 11/12/20     ?temazepam (RESTORIL) 15 MG capsule Take 15 mg by mouth at bedtime. 03/02/20   [provider]  ?Tiotropium Bromide Monohydrate (SPIRIVA RESPIMAT) 2.5 MCG/ACT AERS Inhale two puffs into the lungs as needed. ?Patient taking differently: Inhale 1 puff into the lungs 2 (two) times daily. 12/03/20     ?   ? ?Allergies    ?Pork-derived products and Atorvastatin   ? ?Review of Systems   ?Review of Systems  ?Gastrointestinal:  Positive for nausea.  ?Musculoskeletal:  Positive for arthralgias.  ?All other systems reviewed and are negative. ? ?Physical Exam ?Updated Vital Signs ?BP (!) 121/58 (BP Location: Left Arm)   Pulse 81   Temp 98.7 ?F (37.1 ?C) (Oral)   Resp 16   Ht 6' (1.829 m)   Wt 85.3 kg   SpO2 94%   BMI 25.50 kg/m?  ?Physical Exam ?Vitals and nursing note reviewed.  ?Constitutional:   ?   General: He is not in acute distress. ?   Appearance: Normal appearance. He is not ill-appearing, toxic-appearing or diaphoretic.  ?HENT:  ?   Head: Normocephalic and atraumatic.  ?   Nose: Nose normal. No congestion.  ?   Mouth/Throat:  ?   Mouth: Mucous membranes are moist.  ?   Pharynx: Oropharynx is clear.  ?Eyes:  ?   Extraocular Movements: Extraocular movements intact.  ?   Conjunctiva/sclera: Conjunctivae normal.  ?   Pupils: Pupils are equal, round, and reactive to light.  ?Cardiovascular:  ?   Rate and Rhythm: Normal rate and regular rhythm.  ?Pulmonary:  ?   Effort: Pulmonary effort is normal.  ?   Comments: Decreased breath sounds ?Abdominal:  ?   General: Abdomen is flat. Bowel sounds are normal.  ?   Tenderness: There is no abdominal tenderness.  ?Musculoskeletal:  ?   Right hand: Normal.  ?   Left hand: No swelling, deformity, lacerations  or tenderness. Normal range of motion.  ?   Cervical back: Normal range of motion and neck supple. No tenderness.  ?   Comments: Patient does not show any signs of deformity or fracture to left pinky finger.  Patient has full strength of finger.  Patient neurovascularly intact.  Small amount of blood underneath the fingernail however no nailbed tourniquet trephination.  ?Neurological:  ?   Mental Status: He is alert.  ? ? ?ED Results / Procedures / Treatments   ?Labs ?(all labs ordered are listed, but only abnormal results are displayed) ?Labs Reviewed - No data to display ? ?EKG ?None ? ?Radiology ?DG Finger Little  Left ? ?Result Date: 04/23/2021 ?CLINICAL DATA:  Status post trauma. EXAM: LEFT LITTLE FINGER 2+V COMPARISON:  None. FINDINGS: There is no evidence of fracture or dislocation. Degenerative changes seen involving the interphalangeal joints. Soft tissues are unremarkable. IMPRESSION: 1. No acute fracture or dislocation. 2. Degenerative changes. Electronically Signed   By: Virgina Norfolk [name]   On: 04/23/2021 20:30   ? ?Procedures ?Procedures  ? ? ?Medications Ordered in ED ?Medications  ?ondansetron (ZOFRAN-ODT) disintegrating tablet 8 mg (8 mg Oral Given 04/23/21 2021)  ? ? ?ED Course/ Medical Decision Making/ A&P ?  ?                        ?Medical Decision Making ?Amount and/or Complexity of Data Reviewed ?Radiology: ordered. ? ?Risk ?Prescription drug management. ? ? ?71 year old male presents to ED for evaluation of left pinky finger pain.  Please see HPI for further details. ? ?On examination, patient left ankle finger is small amount of blood underneath fingernail.  No blood in nailbed and needle trephination.  Patient also requesting Zofran medication refill. ? ?Patient worked up utilizing plain film imaging of pinky finger.  No fracture or dislocation is seen.  Patient given 8 mg Zofran and states he feels better.  Patient left pinky buddy taped to fourth finger for comfort. ? ?At this time, the  patient is stable for discharge home.  Patient advised to follow-up with PCP for further issues.  Patient stable. ? ? ?Final Clinical Impression(s) / ED Diagnoses ?Final diagnoses:  ?Injury of finger of left han

## 2021-04-27 ENCOUNTER — Ambulatory Visit: Payer: Medicare Other | Admitting: Physician Assistant

## 2021-04-27 ENCOUNTER — Ambulatory Visit: Payer: Medicare Other

## 2021-04-27 ENCOUNTER — Inpatient Hospital Stay: Payer: Medicare Other

## 2021-04-27 ENCOUNTER — Other Ambulatory Visit (HOSPITAL_COMMUNITY): Payer: Self-pay

## 2021-04-27 MED ORDER — CYCLOBENZAPRINE HCL 5 MG PO TABS
ORAL_TABLET | ORAL | 3 refills | Status: AC
  Filled 2021-04-27: qty 30, 30d supply, fill #0

## 2021-04-28 NOTE — Progress Notes (Signed)
?Radiation Oncology         [phone] ?________________________________ ? ?Name: Jonathon [name] MRN: [id number]  ?Date: 04/29/2021  DOB: 05/27/1950 ? ?Follow-Up Visit Note ? ?CC: [name], MD  [name], MD ? ?  ICD-10-CM   ?1. Primary lung small cell carcinoma, right (HCC)  C34.91 MR Brain W Wo Contrast  ?  ? ? ?Diagnosis:  Limited stage (T3, N2, M0) small cell lung cancer presented with pulmonary nodules and pleural-based nodules in the right lung in addition to mediastinal lymphadenopathy diagnosed in August 2021 ? ?Interval Since Last Radiation:  11 months and 4 days  ? ?Intent: Curative ? ?Radiation Treatment Dates: 05/18/2020 through 05/25/2020 ?Site Technique Total Dose (Gy) Dose per Fx (Gy) Completed Fx Beam Energies  ?Lung, Right: Lung_Rt IMRT 54/54 18 3/3 6XFFF  ? ?He completed prophylactic whole brain radiation therapy in February of last year totaling 25 Gray in 10 fractions. ? ?Narrative:  The patient returns today for routine follow-up and to review recent imaging, he was last seen here for follow up on 01/14/21.  Since his last visit, the patient continued on palliative systemic chemotherapy with Durvalumab, Carboplatin, and Etoposide, and with Imfinzi every 4 weeks under the care of Dr. Julien Nordmann (cosela was discontinued after cycle 1 secondary to phlebitis). During a follow up visit with Dr Julien Nordmann on 02/08/21, the patient was noted to be doing well other than shortness of breath with exertion and intermittent pain on the right side of the chest which resolves spontaneously  ? ?CT of the chest abdomen and pelvis on 03/04/21 showed interval development of numerous pulmonary nodules throughout ?both lungs (some of which with a central cavitary component) concerning for metastatic disease. T enlarged right hilar and prominent right paratracheal lymph nodes were otherwise seen to appear stable in the interval.  ? ?Given findings on CT, Dr. Julien Nordmann discontinued the patient on Durvalumab,  Carboplatin, and Etoposide. Further treatment options were discussed with the patient vs palliative care and hospice referral. Following discussion of the risks and benefits, the patient opted to proceed with third line palliative systemic treatment consisting of Zepzelca (lurbinectedin) every 3 weeks. First dose on 03/16/21.  ? ?Following his first treatment of Zepzelca, the patient developed fatigue and shortness of breath and was hospitalized from 03/20/21 through 03/721 for pneumonia and acute hypoxic respiratory failure. Hospital course included empiric antibiotics with fairly rapid clinical improvement.  He was discharged home in good condition with supplemental oxygen. (CXR performed during admission on 03/04 showed hazy opacities in the mid to lower lungs, right greater than left, in the area of most of the normal residual lung tissue. Findings were noted as consistent with acute bilateral infection or inflammation. An additional left lung base opacity also appreciated was noted as likely atelectasis). ? ?Given his recent hospitalization, Dr. Julien Nordmann delayed cycle 2 of his treatment for 1 week to give him time to recover.  ? ?During his most recent follow up with Dr. Julien Nordmann on 04/12/21, the patient reported feeling well other than ongoing SOB with exertion and a mild cough. The patient also endorsed sleeping more and a lack of appetite since he was hospitalized with pneumonia, but stated he was overall feeling much better. The patient proceeded with proceed cycle #2 during this visit. (Dr. Julien Nordmann also prescribed him Medrol Dosepak for his fatigue and lack of appetite).  ? ?His most recent  brain MRI on 04/21/21 showed no changes since his prior examinations, and no evidence of metastatic ?disease.  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 ? ?He reports continuing fatigue but feeling better since he got out of the hospital.  He reports occasional blurring of his vision but no double vision.  He also feels his hearing has slowly been dropping off.  He denies any significant cough or hemoptysis. ? ?Allergies:  is allergic to pork-derived products and atorvastatin. ? ?Meds: ?Current Outpatient Medications  ?Medication Sig Dispense Refill  ? albuterol (PROVENTIL) (2.5 MG/3ML) 0.083% nebulizer solution Take 2.5 mg by nebulization every 4 (four) hours as needed for wheezing or shortness of breath.    ? Albuterol Sulfate (PROAIR RESPICLICK) 659 (90 BASE) MCG/ACT AEPB Inhale 2 puffs into the lungs 4 (four) times daily as needed. (Patient taking differently: Inhale 2 puffs into the lungs 4 (four) times daily as needed (wheezing).) 1 each 5  ? amLODipine (NORVASC) 10 MG tablet TAKE 1 TABLET BY MOUTH ONCE A DAY (Patient taking differently: Take 10 mg by mouth every evening.) 90 tablet 3  ? atorvastatin (LIPITOR) 20 MG tablet Take one tablet by mouth daily. (Patient taking differently: Take 20 mg by mouth daily.) 30 tablet 5  ? bisoprolol (ZEBETA) 5 MG tablet Take one tablet (5 mg dose) by mouth daily. (Patient taking differently: Take 5 mg by mouth every evening.) 90 tablet 3  ? cloNIDine (CATAPRES) 0.3 MG tablet TAKE 1 TABLET BY MOUTH TWICE DAILY 180 tablet 3  ? Cyanocobalamin (VITAMIN B 12 PO) Take 1 tablet by mouth every morning.     ? cyclobenzaprine (FLEXERIL) 5 MG tablet TAKE 1 TABLET BY MOUTH AT BEDTIME AS NEEDED FOR MUSCLE SPASMS 30 tablet 3  ? DULoxetine (CYMBALTA) 30 MG capsule Take one capsule (30 mg dose) by mouth daily. (Patient taking differently: Take 30 mg by mouth at bedtime.) 90 capsule 3  ? gabapentin (NEURONTIN) 400 MG capsule TAKE 2 CAPSULES BY MOUTH THREE TIMES A DAY 180 capsule 7  ? losartan (COZAAR) 100 MG tablet TAKE 1 TABLET BY MOUTH DAILY. (Patient taking differently: Take 100 mg by mouth  every evening.) 90 tablet 3  ? metFORMIN (GLUCOPHAGE) 500 MG tablet TAKE 1 TABLET BY MOUTH ONCE A DAY WITH BREAKFAST (Patient taking differently: Take 500 mg by mouth daily with breakfast.) 90 tablet 3  ? nitroGLYCERIN (NITROSTAT) 0.4 MG SL tablet PLACE 1 TABLET UNDER THE TONGUE EVERY 5  MINUTES AS NEEDED AS DIRECTED. (Patient taking differently: 0.4 mg every 5 (five) minutes as needed for chest pain.) 25 tablet 5  ? nortriptyline (PAMELOR) 25 MG capsule TAKE 1 CAPSULE BY MOUTH EVERY NIGHT AT BEDTIME 90 capsule 0  ? ondansetron (ZOFRAN) 8 MG tablet Take 0.5 tablets (4 mg total) by mouth every 6 (six) hours. 20 tablet 0  ? pantoprazole (PROTONIX) 20 MG tablet TAKE 1 TABLET BY MOUTH DAILY (Patient taking differently: Take 20 mg by mouth every evening.) 90 tablet 2  ? polyethylene glycol (MIRALAX) 17 g packet Take 17 g by mouth daily as needed for mild constipation.    ? prochlorperazine (COMPAZINE) 10 MG tablet Take 1 tablet (10 mg total) by mouth every 6 (six) hours as needed for nausea or vomiting. 30 tablet 0  ? senna (SENOKOT) 8.6 MG TABS tablet Take 1 tablet (8.6 mg total) by mouth at bedtime as needed for mild constipation. 120 tablet 0  ?  SYMBICORT 160-4.5 MCG/ACT inhaler INHALE 2 PUFFS INTO THE LUNGS TWICE DAILY 10.2 g 11  ? Tiotropium Bromide Monohydrate (SPIRIVA RESPIMAT) 2.5 MCG/ACT AERS Inhale two puffs into the lungs as needed. (Patient taking differently: Inhale 1 puff into the lungs 2 (two) times daily.) 4 g 11  ? methylPREDNISolone (MEDROL DOSEPAK) 4 MG TBPK tablet Use as instructed. (Patient not taking: Reported on 04/29/2021) 21 tablet 0  ? potassium chloride SA (KLOR-CON M) 20 MEQ tablet Take 1 tablet (20 mEq total) by mouth 2 (two) times daily. 20 tablet 0  ? temazepam (RESTORIL) 15 MG capsule Take 15 mg by mouth at bedtime. (Patient not taking: Reported on 04/29/2021)    ? ?No current facility-administered medications for this encounter.  ? ? ?Physical Findings: ?The patient is in no acute distress.  Patient is alert and oriented. ? height is 6' (1.829 m) and weight is 173 lb 3.2 oz (78.6 kg). His temperature is 97.8 ?F (36.6 ?C). His blood pressure is 110/69 and his pulse is 72. His respiration is

## 2021-04-28 NOTE — Progress Notes (Signed)
Jonathon [name] ?OFFICE PROGRESS NOTE ? ?Jonathon Limbo, MD ?[address] ?[place] [address] ? ?DIAGNOSIS:  Relapsed small cell lung cancer initially diagnosed as limited stage (T3, N2, M0) small cell lung cancer presented with pulmonary nodules and pleural-based nodules in the right lung in addition to mediastinal lymphadenopathy diagnosed in August 2021.  The patient was previously treated without biopsy to a pleural-based nodule in the right lower lobe with SBRT in March 2021 but had evidence for disease recurrence and progression.  He has disease relapse in June 2022. ? ?PRIOR THERAPY: ?1) SBRT to right lower lobe pulmonary nodule in March 2021 under the care of Dr. Sondra Come. ?2) Systemic chemotherapy with carboplatin for AUC of 5 on day 1 and etoposide 100 mg/M2 on days 1, 2 and 3 with Neulasta support every 3 weeks.  First cycle 10/07/2019.  Status post 4 cycles.  Last dose of the chemotherapy was given on December 09, 2019. ?3) Second line systemic chemotherapy with carboplatin for AUC of 5 on day 1, etoposide 100 Mg/M2 on days 1, 2 and 3 with Cosela before the chemotherapy in addition to Imfinzi 1500 Mg IV on day 1 every 3 weeks.  First dose July 27, 2020.  Status post 9 cycles.  Cosela was discontinued secondary to phlebitis.  Starting from cycle #5 the patient will be on maintenance treatment with Imfinzi 1500 Mg IV every 4 weeks.  Last dose was given on February 08, 2021 discontinued secondary to disease progression ? ?CURRENT THERAPY: Zepzelca (lurbinectedin) 3.2 Mg/M2 every 3 weeks.  First dose March 15, 2021.  Status post 2 cycles. ? ?INTERVAL HISTORY: ?Jonathon [name] 71 y.o. male returns to the clinic today for a follow-up visit accompanied by his wife.  The patient was recently found to have evidence of disease progression and his treatment was switched to [place].  The patient was admitted after cycle #1 for pneumonia.  He tolerated treatment #2 fairly well. He  is not very active due to fatigue and weakness. He reports he has had nausea/vomiting about 2 time after eating in the interval. He has zofran which was given to him in the ER but does not have compazine.  Dr. Julien Nordmann gave him a Medrol Dosepak at his last appointment for his fatigue and decreased appetite which did not help his symptoms. He lost another 11 lbs. He is scheduled to meet with a member of the nutritionist team while in the infusion room today. He does not like the taste of ensure/boost due to it being too sweet. He continues to have stable dyspnea on exertion with mild exertion. We will arrange for restaging CT scan after this cycle of treatment to assess for disease progression or response to treatment. ? ? He denies any fever, chills, or night sweats. He denies any chest pain, cough, or hemoptysis. He reports his last bowel movement was 2 days ago. He reports he frequently has a dry mouth so his wife give him candies to suck on. Because he has been sucking on candy, he has a tooth that hurts when he eats candy. He has poor dentition. There is no swelling of his gums. He sometimes has an occassionally mild self limiting headache on the left temporal region that resolves spontaneously without intervention. He is here today for evaluation and repeat blood work before starting cycle #3. ? ? ? ? ?MEDICAL HISTORY: ?Past Medical History:  ?Diagnosis Date  ? Cancer [facility])   ? CHF (congestive  heart failure) ([place])   ? Chronic cough   ? COPD, severe ([place])   ? PT DENIES REGULAR DAILY INHALER ANY PRN  ? Coronary artery disease   ? CVA (cerebral vascular accident) ([place]) 02/22/2011  ? LEFT BRAIN STEM PONTINE HEMORRHAGE--  RIGHT SIDED WEAKNESS  ? Diabetes mellitus without complication ([place])   ? type 2  ? Dyspnea   ? GERD (gastroesophageal reflux disease)   ? Harsh voice quality   ? PT STATES NORMAL FOR HIM  ? High cholesterol   ? History of CHF (congestive heart failure) MONITORED BY DR Coletta Memos  ? DIASTOLIC  ? History  of radiation therapy 03/13/19-03/20/19  ? SBRT Right Lung; Dr. Gery Pray  ? History of radiation therapy 02/17/20-02/28/20  ? Whole brain radiation; Dr. Gery Pray  ? History of radiation therapy 05/25/2020  ? 05/18/2020-05/25/2020  SBRT Right Lung; Dr. Gery Pray  ? Hydrocele, left   ? Hypertension   ? NSTEMI (non-ST elevated myocardial infarction) ([place]) 12/2015  ? Pre-operative clearance   ? GIVEN BY DR Coletta Memos  ? Short of breath on exertion   ? Smoker   ? Weakness of right side of body   ? S/P CVA   FEB 2013  ? ? ?ALLERGIES:  is allergic to pork-derived products and atorvastatin. ? ?MEDICATIONS:  ?Current Outpatient Medications  ?Medication Sig Dispense Refill  ? albuterol (PROVENTIL) (2.5 MG/3ML) 0.083% nebulizer solution Take 2.5 mg by nebulization every 4 (four) hours as needed for wheezing or shortness of breath.    ? Albuterol Sulfate (PROAIR RESPICLICK) 607 (90 BASE) MCG/ACT AEPB Inhale 2 puffs into the lungs 4 (four) times daily as needed. (Patient taking differently: Inhale 2 puffs into the lungs 4 (four) times daily as needed (wheezing).) 1 each 5  ? amLODipine (NORVASC) 10 MG tablet TAKE 1 TABLET BY MOUTH ONCE A DAY (Patient taking differently: Take 10 mg by mouth every evening.) 90 tablet 3  ? atorvastatin (LIPITOR) 20 MG tablet Take one tablet by mouth daily. (Patient taking differently: Take 20 mg by mouth daily.) 30 tablet 5  ? bisoprolol (ZEBETA) 5 MG tablet Take one tablet (5 mg dose) by mouth daily. (Patient taking differently: Take 5 mg by mouth every evening.) 90 tablet 3  ? cloNIDine (CATAPRES) 0.3 MG tablet TAKE 1 TABLET BY MOUTH TWICE DAILY 180 tablet 3  ? Cyanocobalamin (VITAMIN B 12 PO) Take 1 tablet by mouth every morning.     ? cyclobenzaprine (FLEXERIL) 5 MG tablet TAKE 1 TABLET BY MOUTH AT BEDTIME AS NEEDED FOR MUSCLE SPASMS 30 tablet 3  ? doxycycline (VIBRA-TABS) 100 MG tablet Take 1 tablet (100 mg total) by mouth 2 (two) times daily. 10 tablet 0  ? DULoxetine (CYMBALTA) 30 MG  capsule Take one capsule (30 mg dose) by mouth daily. (Patient taking differently: Take 30 mg by mouth at bedtime.) 90 capsule 3  ? gabapentin (NEURONTIN) 400 MG capsule TAKE 2 CAPSULES BY MOUTH THREE TIMES A DAY 180 capsule 7  ? losartan (COZAAR) 100 MG tablet TAKE 1 TABLET BY MOUTH DAILY. (Patient taking differently: Take 100 mg by mouth every evening.) 90 tablet 3  ? metFORMIN (GLUCOPHAGE) 500 MG tablet TAKE 1 TABLET BY MOUTH ONCE A DAY WITH BREAKFAST (Patient taking differently: Take 500 mg by mouth daily with breakfast.) 90 tablet 3  ? methylPREDNISolone (MEDROL DOSEPAK) 4 MG TBPK tablet Use as instructed. 21 tablet 0  ? nitroGLYCERIN (NITROSTAT) 0.4 MG SL tablet PLACE 1 TABLET UNDER THE TONGUE EVERY  5  MINUTES AS NEEDED AS DIRECTED. (Patient taking differently: 0.4 mg every 5 (five) minutes as needed for chest pain.) 25 tablet 5  ? nortriptyline (PAMELOR) 25 MG capsule TAKE 1 CAPSULE BY MOUTH EVERY NIGHT AT BEDTIME 90 capsule 0  ? ondansetron (ZOFRAN) 8 MG tablet Take 0.5 tablets (4 mg total) by mouth every 6 (six) hours. 20 tablet 0  ? pantoprazole (PROTONIX) 20 MG tablet TAKE 1 TABLET BY MOUTH DAILY (Patient taking differently: Take 20 mg by mouth every evening.) 90 tablet 2  ? polyethylene glycol (MIRALAX) 17 g packet Take 17 g by mouth daily as needed for mild constipation.    ? potassium chloride SA (KLOR-CON M) 20 MEQ tablet Take 1 tablet (20 mEq total) by mouth 2 (two) times daily. 20 tablet 0  ? prochlorperazine (COMPAZINE) 10 MG tablet Take 1 tablet by mouth every 6  hours as needed for nausea or vomiting. 30 tablet 2  ? senna (SENOKOT) 8.6 MG TABS tablet Take 1 tablet (8.6 mg total) by mouth at bedtime as needed for mild constipation. 120 tablet 0  ? SYMBICORT 160-4.5 MCG/ACT inhaler INHALE 2 PUFFS INTO THE LUNGS TWICE DAILY 10.2 g 11  ? Tiotropium Bromide Monohydrate (SPIRIVA RESPIMAT) 2.5 MCG/ACT AERS Inhale two puffs into the lungs as needed. (Patient taking differently: Inhale 1 puff into the  lungs 2 (two) times daily.) 4 g 11  ? temazepam (RESTORIL) 15 MG capsule Take 15 mg by mouth at bedtime. (Patient not taking: Reported on 04/29/2021)    ? ?No current facility-administered medications for t

## 2021-04-29 ENCOUNTER — Ambulatory Visit
Admission: RE | Admit: 2021-04-29 | Discharge: 2021-04-29 | Disposition: A | Discharge status: Home / Self Care | Payer: Medicare Other | Source: Ambulatory Visit | Attending: Radiation Oncology | Admitting: Radiation Oncology

## 2021-04-29 ENCOUNTER — Other Ambulatory Visit (HOSPITAL_COMMUNITY): Payer: Self-pay

## 2021-04-29 ENCOUNTER — Encounter: Payer: Self-pay | Admitting: Radiation Oncology

## 2021-04-29 ENCOUNTER — Other Ambulatory Visit: Payer: Self-pay | Admitting: Internal Medicine

## 2021-04-29 ENCOUNTER — Inpatient Hospital Stay: Payer: Medicare Other

## 2021-04-29 VITALS — BP 110/69 | HR 72 | Temp 97.8°F | Resp 20 | Ht 72.0 in | Wt 173.2 lb

## 2021-04-29 DIAGNOSIS — Z7984 Long term (current) use of oral hypoglycemic drugs: Secondary | ICD-10-CM | POA: Insufficient documentation

## 2021-04-29 DIAGNOSIS — C3431 Malignant neoplasm of lower lobe, right bronchus or lung: Secondary | ICD-10-CM | POA: Insufficient documentation

## 2021-04-29 DIAGNOSIS — I6782 Cerebral ischemia: Secondary | ICD-10-CM | POA: Insufficient documentation

## 2021-04-29 DIAGNOSIS — Z5111 Encounter for antineoplastic chemotherapy: Secondary | ICD-10-CM | POA: Diagnosis not present

## 2021-04-29 DIAGNOSIS — R630 Anorexia: Secondary | ICD-10-CM | POA: Insufficient documentation

## 2021-04-29 DIAGNOSIS — Z79899 Other long term (current) drug therapy: Secondary | ICD-10-CM | POA: Insufficient documentation

## 2021-04-29 DIAGNOSIS — C3491 Malignant neoplasm of unspecified part of right bronchus or lung: Secondary | ICD-10-CM

## 2021-04-29 DIAGNOSIS — Z923 Personal history of irradiation: Secondary | ICD-10-CM | POA: Insufficient documentation

## 2021-04-29 DIAGNOSIS — Z7951 Long term (current) use of inhaled steroids: Secondary | ICD-10-CM | POA: Insufficient documentation

## 2021-04-29 DIAGNOSIS — Z8673 Personal history of transient ischemic attack (TIA), and cerebral infarction without residual deficits: Secondary | ICD-10-CM | POA: Insufficient documentation

## 2021-04-29 LAB — CMP (CANCER CENTER ONLY)
ALT: 13 U/L (ref 0–44)
AST: 12 U/L — ABNORMAL LOW (ref 15–41)
Albumin: 3.3 g/dL — ABNORMAL LOW (ref 3.5–5.0)
Alkaline Phosphatase: 80 U/L (ref 38–126)
Anion gap: 9 (ref 5–15)
BUN: 7 mg/dL — ABNORMAL LOW (ref 8–23)
CO2: 23 mmol/L (ref 22–32)
Calcium: 8.2 mg/dL — ABNORMAL LOW (ref 8.9–10.3)
Chloride: 101 mmol/L (ref 98–111)
Creatinine: 0.88 mg/dL (ref 0.61–1.24)
GFR, Estimated: >60 mL/min (ref >60)
Glucose, Bld: 133 mg/dL — ABNORMAL HIGH (ref 70–99)
Potassium: 3.1 mmol/L — ABNORMAL LOW (ref 3.5–5.1)
Sodium: 133 mmol/L — ABNORMAL LOW (ref 135–145)
Total Bilirubin: 0.5 mg/dL (ref 0.3–1.2)
Total Protein: 6.9 g/dL (ref 6.5–8.1)

## 2021-04-29 LAB — CBC WITH DIFFERENTIAL (CANCER CENTER ONLY)
Abs Immature Granulocytes: 0.1 10*3/uL — ABNORMAL HIGH (ref 0.00–0.07)
Basophils Absolute: 0 10*3/uL (ref 0.0–0.1)
Basophils Relative: 0 %
Eosinophils Absolute: 0 10*3/uL (ref 0.0–0.5)
Eosinophils Relative: 0 %
HCT: 25.9 % — ABNORMAL LOW (ref 39.0–52.0)
Hemoglobin: 8.6 g/dL — ABNORMAL LOW (ref 13.0–17.0)
Immature Granulocytes: 3 %
Lymphocytes Relative: 18 %
Lymphs Abs: 0.6 10*3/uL — ABNORMAL LOW (ref 0.7–4.0)
MCH: 27 pg (ref 26.0–34.0)
MCHC: 33.2 g/dL (ref 30.0–36.0)
MCV: 81.4 fL (ref 80.0–100.0)
Monocytes Absolute: 1.1 10*3/uL — ABNORMAL HIGH (ref 0.1–1.0)
Monocytes Relative: 31 %
Neutro Abs: 1.6 10*3/uL — ABNORMAL LOW (ref 1.7–7.7)
Neutrophils Relative %: 48 %
Platelet Count: 113 10*3/uL — ABNORMAL LOW (ref 150–400)
RBC: 3.18 MIL/uL — ABNORMAL LOW (ref 4.22–5.81)
RDW: 20.4 % — ABNORMAL HIGH (ref 11.5–15.5)
WBC Count: 3.4 10*3/uL — ABNORMAL LOW (ref 4.0–10.5)
nRBC: 0 % (ref 0.0–0.2)

## 2021-04-29 MED ORDER — POTASSIUM CHLORIDE CRYS ER 20 MEQ PO TBCR
20.0000 meq | EXTENDED_RELEASE_TABLET | Freq: Two times a day (BID) | ORAL | 0 refills | Status: DC
  Filled 2021-04-29: qty 20, 10d supply, fill #0

## 2021-04-29 NOTE — Progress Notes (Signed)
Jonathon [name] is here today for follow up post radiation to the brain and right lung. ?  ? ?They completed their radiation on: brain - 02/28/2020, right lung 05/25/2020 ? ?Does the patient complain of any of the following: ?Headache: denies ?Visual Changes: denies ?Hearing Changes: decreased hearing ?Nausea: yes ?Vomiting: yes when eating even small amounts ?Balance or coordination issues: increased from baseline ?Memory issues: same as baseline ? ?Is the patient currently on a Decadron regimen? : no ? ?Pain:denies ?Shortness of breath w/wo exertion: shortness of breath with activity ?Cough: occasional productive cough with white phlegm ?Hemoptysis: denies ?Pain with swallowing: yes ?Swallowing/choking concerns: choking and swallowing difficulty ?Appetite: poor ?Energy Level: poor ?Post radiation skin Changes: denies ? ? ? ?Additional comments if applicable: nothing of note ? ?Vitals:  ? 04/29/21 1556  ?BP: 110/69  ?Pulse: 72  ?Resp: 20  ?Temp: 97.8 ?F (36.6 ?C)  ?SpO2: 98%  ?Weight: 173 lb 3.2 oz (78.6 kg)  ?Height: 6' (1.829 m)  ? ? ? ?

## 2021-05-03 ENCOUNTER — Ambulatory Visit: Payer: Medicare Other | Admitting: Internal Medicine

## 2021-05-03 ENCOUNTER — Inpatient Hospital Stay (HOSPITAL_BASED_OUTPATIENT_CLINIC_OR_DEPARTMENT_OTHER): Payer: Medicare Other | Admitting: Physician Assistant

## 2021-05-03 ENCOUNTER — Other Ambulatory Visit: Payer: Self-pay

## 2021-05-03 ENCOUNTER — Other Ambulatory Visit: Payer: Medicare Other

## 2021-05-03 ENCOUNTER — Other Ambulatory Visit (HOSPITAL_COMMUNITY): Payer: Self-pay

## 2021-05-03 ENCOUNTER — Inpatient Hospital Stay: Payer: Medicare Other

## 2021-05-03 ENCOUNTER — Encounter: Payer: Self-pay | Admitting: Physician Assistant

## 2021-05-03 ENCOUNTER — Ambulatory Visit: Payer: Medicare Other

## 2021-05-03 VITALS — BP 109/64 | HR 84 | Temp 97.7°F | Resp 16 | Wt 169.9 lb

## 2021-05-03 DIAGNOSIS — E876 Hypokalemia: Secondary | ICD-10-CM | POA: Diagnosis not present

## 2021-05-03 DIAGNOSIS — C3491 Malignant neoplasm of unspecified part of right bronchus or lung: Secondary | ICD-10-CM

## 2021-05-03 DIAGNOSIS — Z5111 Encounter for antineoplastic chemotherapy: Secondary | ICD-10-CM | POA: Diagnosis not present

## 2021-05-03 DIAGNOSIS — D649 Anemia, unspecified: Secondary | ICD-10-CM

## 2021-05-03 DIAGNOSIS — K0889 Other specified disorders of teeth and supporting structures: Secondary | ICD-10-CM | POA: Diagnosis not present

## 2021-05-03 LAB — CBC WITH DIFFERENTIAL (CANCER CENTER ONLY)
Abs Immature Granulocytes: 0.02 10*3/uL (ref 0.00–0.07)
Basophils Absolute: 0 10*3/uL (ref 0.0–0.1)
Basophils Relative: 0 %
Eosinophils Absolute: 0 10*3/uL (ref 0.0–0.5)
Eosinophils Relative: 0 %
HCT: 26.1 % — ABNORMAL LOW (ref 39.0–52.0)
Hemoglobin: 8.5 g/dL — ABNORMAL LOW (ref 13.0–17.0)
Immature Granulocytes: 1 %
Lymphocytes Relative: 16 %
Lymphs Abs: 0.6 10*3/uL — ABNORMAL LOW (ref 0.7–4.0)
MCH: 26.8 pg (ref 26.0–34.0)
MCHC: 32.6 g/dL (ref 30.0–36.0)
MCV: 82.3 fL (ref 80.0–100.0)
Monocytes Absolute: 0.9 10*3/uL (ref 0.1–1.0)
Monocytes Relative: 23 %
Neutro Abs: 2.2 10*3/uL (ref 1.7–7.7)
Neutrophils Relative %: 60 %
Platelet Count: 178 10*3/uL (ref 150–400)
RBC: 3.17 MIL/uL — ABNORMAL LOW (ref 4.22–5.81)
RDW: 21.2 % — ABNORMAL HIGH (ref 11.5–15.5)
WBC Count: 3.7 10*3/uL — ABNORMAL LOW (ref 4.0–10.5)
nRBC: 0 % (ref 0.0–0.2)

## 2021-05-03 LAB — CMP (CANCER CENTER ONLY)
ALT: 10 U/L (ref 0–44)
AST: 12 U/L — ABNORMAL LOW (ref 15–41)
Albumin: 3.3 g/dL — ABNORMAL LOW (ref 3.5–5.0)
Alkaline Phosphatase: 76 U/L (ref 38–126)
Anion gap: 8 (ref 5–15)
BUN: 8 mg/dL (ref 8–23)
CO2: 25 mmol/L (ref 22–32)
Calcium: 8.3 mg/dL — ABNORMAL LOW (ref 8.9–10.3)
Chloride: 101 mmol/L (ref 98–111)
Creatinine: 0.84 mg/dL (ref 0.61–1.24)
GFR, Estimated: >60 mL/min (ref >60)
Glucose, Bld: 134 mg/dL — ABNORMAL HIGH (ref 70–99)
Potassium: 2.8 mmol/L — ABNORMAL LOW (ref 3.5–5.1)
Sodium: 134 mmol/L — ABNORMAL LOW (ref 135–145)
Total Bilirubin: 0.6 mg/dL (ref 0.3–1.2)
Total Protein: 6.8 g/dL (ref 6.5–8.1)

## 2021-05-03 MED ORDER — SODIUM CHLORIDE 0.9 % IV SOLN
10.0000 mg | Freq: Once | INTRAVENOUS | Status: AC
  Administered 2021-05-03: 10 mg via INTRAVENOUS
  Filled 2021-05-03: qty 10

## 2021-05-03 MED ORDER — POTASSIUM CHLORIDE 10 MEQ/100ML IV SOLN
10.0000 meq | INTRAVENOUS | Status: AC
  Administered 2021-05-03 (×2): 10 meq via INTRAVENOUS
  Filled 2021-05-03 (×2): qty 100

## 2021-05-03 MED ORDER — PALONOSETRON HCL INJECTION 0.25 MG/5ML
0.2500 mg | Freq: Once | INTRAVENOUS | Status: AC
  Administered 2021-05-03: 0.25 mg via INTRAVENOUS
  Filled 2021-05-03: qty 5

## 2021-05-03 MED ORDER — SODIUM CHLORIDE 0.9 % IV SOLN: Freq: Once | INTRAVENOUS | Status: AC

## 2021-05-03 MED ORDER — SODIUM CHLORIDE 0.9 % IV SOLN
3.2000 mg/m2 | Freq: Once | INTRAVENOUS | Status: AC
  Administered 2021-05-03: 6.65 mg via INTRAVENOUS
  Filled 2021-05-03: qty 13.3

## 2021-05-03 MED ORDER — DOXYCYCLINE HYCLATE 100 MG PO TABS
100.0000 mg | ORAL_TABLET | Freq: Two times a day (BID) | ORAL | 0 refills | Status: DC
  Filled 2021-05-03: qty 10, 5d supply, fill #0

## 2021-05-03 MED ORDER — POTASSIUM CHLORIDE CRYS ER 20 MEQ PO TBCR
20.0000 meq | EXTENDED_RELEASE_TABLET | Freq: Two times a day (BID) | ORAL | 0 refills | Status: AC
  Filled 2021-05-03: qty 10, 5d supply, fill #0

## 2021-05-03 MED ORDER — PROCHLORPERAZINE MALEATE 10 MG PO TABS
10.0000 mg | ORAL_TABLET | Freq: Four times a day (QID) | ORAL | 2 refills | Status: AC | PRN
  Filled 2021-05-03: qty 30, 8d supply, fill #0

## 2021-05-03 NOTE — Patient Instructions (Addendum)
[facility]  ?** Please take your potassium chloride prescription as ordered: Take 1 tablet (20 mEq total) by mouth 2 (two) times daily ** ? Discharge Instructions: ?Thank you for choosing [facility] to provide your oncology and hematology care.  ? ?If you have a lab appointment with the [facility], please go directly to the [facility] and check in at the registration area. ?  ?Wear comfortable clothing and clothing appropriate for easy access to any Portacath or PICC line.  ? ?We strive to give you quality time with your provider. You may need to reschedule your appointment if you arrive late (15 or more minutes).  Arriving late affects you and other patients whose appointments are after yours.  Also, if you miss three or more appointments without notifying the office, you may be dismissed from the clinic at the provider?s discretion.    ?  ?For prescription refill requests, have your pharmacy contact our office and allow 72 hours for refills to be completed.   ? ?Today you received the following chemotherapy and/or immunotherapy agents: Lurbinectedin (Zepzelca)     ?  ?To help prevent nausea and vomiting after your treatment, we encourage you to take your nausea medication as directed. ? ?BELOW ARE SYMPTOMS THAT SHOULD BE REPORTED IMMEDIATELY: ?*FEVER GREATER THAN 100.4 F (38 ?C) OR HIGHER ?*CHILLS OR SWEATING ?*NAUSEA AND VOMITING THAT IS NOT CONTROLLED WITH YOUR NAUSEA MEDICATION ?*UNUSUAL SHORTNESS OF BREATH ?*UNUSUAL BRUISING OR BLEEDING ?*URINARY PROBLEMS (pain or burning when urinating, or frequent urination) ?*BOWEL PROBLEMS (unusual diarrhea, constipation, pain near the anus) ?TENDERNESS IN MOUTH AND THROAT WITH OR WITHOUT PRESENCE OF ULCERS (sore throat, sores in mouth, or a toothache) ?UNUSUAL RASH, SWELLING OR PAIN  ?UNUSUAL VAGINAL DISCHARGE OR ITCHING  ? ?Items with * indicate a potential emergency and should be followed up as soon as possible or go  to the Emergency Department if any problems should occur. ? ?Please show the CHEMOTHERAPY ALERT CARD or IMMUNOTHERAPY ALERT CARD at check-in to the Emergency Department and triage nurse. ? ?Should you have questions after your visit or need to cancel or reschedule your appointment, please contact [facility]  Dept: [phone]  and follow the prompts.  Office hours are 8:00 a.m. to 4:30 p.m. Monday - Friday. Please note that voicemails left after 4:00 p.m. may not be returned until the following business day.  We are closed weekends and major holidays. You have access to a nurse at all times for urgent questions. Please call the main number to the clinic Dept: [phone] and follow the prompts. ? ? ?For any non-urgent questions, you may also contact your provider using MyChart. We now offer e-Visits for anyone 61 and older to request care online for non-urgent symptoms. For details visit [email or web address]. ?  ?Also download the MyChart app! Go to the app store, search "MyChart", open the app, select [facility], and log in with your MyChart username and password. ? ?Due to Covid, a mask is required upon entering the hospital/clinic. If you do not have a mask, one will be given to you upon arrival. For doctor visits, patients may have 1 support person aged 49 or older with them. For treatment visits, patients cannot have anyone with them due to current Covid guidelines and our immunocompromised population.  ? ?

## 2021-05-03 NOTE — Progress Notes (Signed)
Nutrition Follow-up: ? ?Add on to schedule today.  Ensure too sweet. ? ?Patient with recurrent small cell lung cancer.  Patient receiving zepzelca.   ? ?Met with patient during infusion.  Reports that he does not have an appetite.  Unsure if he is taking Medrol dosepak.  "My wife keeps up with all my medication."  Says that ensure is too sweet for him to drink.  Says that he vomited on 2 different occassions after eating.  Did not take nausea medication.  ? ? ? ?Medications: compazine ? ?Labs: reviewed ? ?Anthropometrics:  ? ?Weight 169 lb 14.4 oz today ? ?189 lb 8 oz on 2/28 ?UBW of 196-205 lb ? ?11% weight loss in the last month and half, significant ? ? ?NUTRITION DIAGNOSIS: Unintentional weight loss continues ? ? ? ?INTERVENTION:  ?Encouraged taking nausea medication ?Encouraged patient to dilute oral nutrition supplements with milk or pour over ice and let the ice melt.  Provided list of shakes that are lower in sugar for patient to try. ?Encouraged patient to nibble q 2 hours ?  ? ?MONITORING, EVALUATION, GOAL: weight trends, intake ? ? ?NEXT VISIT: Monday, May 8 during infusion ? ?Gwendolen Hewlett B. Zenia Resides, RD, LDN ?Registered Dietitian ?[phone] ? ? ?

## 2021-05-05 ENCOUNTER — Telehealth: Payer: Self-pay | Admitting: Internal Medicine

## 2021-05-05 NOTE — Telephone Encounter (Signed)
Scheduled per 04/17 los, patient has been called and notified. ?

## 2021-05-10 ENCOUNTER — Inpatient Hospital Stay: Payer: Medicare Other

## 2021-05-10 ENCOUNTER — Telehealth: Payer: Self-pay | Admitting: Internal Medicine

## 2021-05-10 NOTE — Telephone Encounter (Signed)
.  Called pt per 4/24 inbasket , Patient was unavailable, a message with appt time and date was left with number on file.   ?

## 2021-05-11 ENCOUNTER — Other Ambulatory Visit (HOSPITAL_COMMUNITY): Payer: Self-pay

## 2021-05-12 ENCOUNTER — Inpatient Hospital Stay: Payer: Medicare Other

## 2021-05-12 ENCOUNTER — Other Ambulatory Visit (HOSPITAL_COMMUNITY): Payer: Self-pay

## 2021-05-12 ENCOUNTER — Other Ambulatory Visit: Payer: Self-pay

## 2021-05-12 ENCOUNTER — Telehealth: Payer: Self-pay | Admitting: Medical Oncology

## 2021-05-12 ENCOUNTER — Other Ambulatory Visit: Payer: Self-pay | Admitting: Pharmacist

## 2021-05-12 ENCOUNTER — Telehealth: Payer: Self-pay | Admitting: Internal Medicine

## 2021-05-12 ENCOUNTER — Telehealth: Payer: Self-pay | Admitting: *Deleted

## 2021-05-12 DIAGNOSIS — K529 Noninfective gastroenteritis and colitis, unspecified: Secondary | ICD-10-CM | POA: Diagnosis not present

## 2021-05-12 DIAGNOSIS — D61810 Antineoplastic chemotherapy induced pancytopenia: Secondary | ICD-10-CM | POA: Diagnosis not present

## 2021-05-12 DIAGNOSIS — C3491 Malignant neoplasm of unspecified part of right bronchus or lung: Secondary | ICD-10-CM

## 2021-05-12 DIAGNOSIS — D649 Anemia, unspecified: Secondary | ICD-10-CM

## 2021-05-12 LAB — CBC WITH DIFFERENTIAL (CANCER CENTER ONLY)
Abs Immature Granulocytes: 0 10*3/uL (ref 0.00–0.07)
Basophils Absolute: 0 10*3/uL (ref 0.0–0.1)
Basophils Relative: 0 %
Eosinophils Absolute: 0 10*3/uL (ref 0.0–0.5)
Eosinophils Relative: 0 %
HCT: 22.3 % — ABNORMAL LOW (ref 39.0–52.0)
Hemoglobin: 7.3 g/dL — ABNORMAL LOW (ref 13.0–17.0)
Immature Granulocytes: 0 %
Lymphocytes Relative: 73 %
Lymphs Abs: 0.2 10*3/uL — ABNORMAL LOW (ref 0.7–4.0)
MCH: 27.2 pg (ref 26.0–34.0)
MCHC: 32.7 g/dL (ref 30.0–36.0)
MCV: 83.2 fL (ref 80.0–100.0)
Monocytes Absolute: 0 10*3/uL — ABNORMAL LOW (ref 0.1–1.0)
Monocytes Relative: 9 %
Neutro Abs: 0.1 10*3/uL — CL (ref 1.7–7.7)
Neutrophils Relative %: 18 %
Platelet Count: 16 10*3/uL — ABNORMAL LOW (ref 150–400)
RBC: 2.68 MIL/uL — ABNORMAL LOW (ref 4.22–5.81)
RDW: 21.3 % — ABNORMAL HIGH (ref 11.5–15.5)
Smear Review: NORMAL
WBC Count: 0.3 10*3/uL — CL (ref 4.0–10.5)
nRBC: 0 % (ref 0.0–0.2)

## 2021-05-12 LAB — CMP (CANCER CENTER ONLY)
ALT: 27 U/L (ref 0–44)
AST: 35 U/L (ref 15–41)
Albumin: 3.1 g/dL — ABNORMAL LOW (ref 3.5–5.0)
Alkaline Phosphatase: 78 U/L (ref 38–126)
Anion gap: 13 (ref 5–15)
BUN: 46 mg/dL — ABNORMAL HIGH (ref 8–23)
CO2: 18 mmol/L — ABNORMAL LOW (ref 22–32)
Calcium: 7.9 mg/dL — ABNORMAL LOW (ref 8.9–10.3)
Chloride: 102 mmol/L (ref 98–111)
Creatinine: 2.43 mg/dL — ABNORMAL HIGH (ref 0.61–1.24)
GFR, Estimated: 28 mL/min — ABNORMAL LOW (ref >60)
Glucose, Bld: 121 mg/dL — ABNORMAL HIGH (ref 70–99)
Potassium: 3.3 mmol/L — ABNORMAL LOW (ref 3.5–5.1)
Sodium: 133 mmol/L — ABNORMAL LOW (ref 135–145)
Total Bilirubin: 0.8 mg/dL (ref 0.3–1.2)
Total Protein: 6.9 g/dL (ref 6.5–8.1)

## 2021-05-12 LAB — SAMPLE TO BLOOD BANK

## 2021-05-12 NOTE — Telephone Encounter (Signed)
.  Called patient to schedule appointment per 4/26 inbasket, patient is aware of date and time.   ?

## 2021-05-12 NOTE — Telephone Encounter (Signed)
LVM for wife and pt regarding pt need for GCSF injections  x 3 days. ?Schedule message sent. ?

## 2021-05-12 NOTE — Telephone Encounter (Signed)
CRITICAL VALUE STICKER ? ?CRITICAL VALUE: WBC 0.3, ANC 0.1 ? ?RECEIVER (on-site recipient of call): Taimur Fier [place]  ? ?DATE & TIME NOTIFIED: 05/12/21 1455 ? ?MESSENGER (representative from lab): Lauren  ? ?MD NOTIFIED: Abelina Bachelor, RN with Dr Julien Nordmann ? ?TIME OF NOTIFICATION:1457 ? ?RESPONSE:  will tell MD  ?

## 2021-05-13 ENCOUNTER — Inpatient Hospital Stay: Payer: Medicare Other

## 2021-05-13 ENCOUNTER — Other Ambulatory Visit (HOSPITAL_COMMUNITY): Payer: Self-pay

## 2021-05-13 MED ORDER — TAMSULOSIN HCL 0.4 MG PO CAPS
ORAL_CAPSULE | ORAL | 3 refills | Status: AC
  Filled 2021-05-13: qty 90, 90d supply, fill #0

## 2021-05-14 ENCOUNTER — Encounter (HOSPITAL_COMMUNITY): Payer: Self-pay

## 2021-05-14 ENCOUNTER — Telehealth: Payer: Self-pay | Admitting: Medical Oncology

## 2021-05-14 ENCOUNTER — Emergency Department (HOSPITAL_COMMUNITY): Payer: Medicare Other

## 2021-05-14 ENCOUNTER — Other Ambulatory Visit: Payer: Self-pay

## 2021-05-14 ENCOUNTER — Inpatient Hospital Stay (HOSPITAL_COMMUNITY)
Admission: EM | Admit: 2021-05-14 | Discharge: 2021-06-17 | DRG: 808 | Disposition: E | Payer: Medicare Other | Attending: Internal Medicine | Admitting: Internal Medicine

## 2021-05-14 ENCOUNTER — Inpatient Hospital Stay: Payer: Medicare Other

## 2021-05-14 DIAGNOSIS — R590 Localized enlarged lymph nodes: Secondary | ICD-10-CM | POA: Diagnosis present

## 2021-05-14 DIAGNOSIS — C3401 Malignant neoplasm of right main bronchus: Secondary | ICD-10-CM | POA: Diagnosis present

## 2021-05-14 DIAGNOSIS — R197 Diarrhea, unspecified: Secondary | ICD-10-CM | POA: Diagnosis not present

## 2021-05-14 DIAGNOSIS — N179 Acute kidney failure, unspecified: Secondary | ICD-10-CM | POA: Diagnosis present

## 2021-05-14 DIAGNOSIS — Z20822 Contact with and (suspected) exposure to covid-19: Secondary | ICD-10-CM | POA: Diagnosis present

## 2021-05-14 DIAGNOSIS — T451X5A Adverse effect of antineoplastic and immunosuppressive drugs, initial encounter: Secondary | ICD-10-CM | POA: Diagnosis present

## 2021-05-14 DIAGNOSIS — K828 Other specified diseases of gallbladder: Secondary | ICD-10-CM | POA: Diagnosis present

## 2021-05-14 DIAGNOSIS — D61810 Antineoplastic chemotherapy induced pancytopenia: Secondary | ICD-10-CM | POA: Diagnosis present

## 2021-05-14 DIAGNOSIS — D61818 Other pancytopenia: Secondary | ICD-10-CM | POA: Diagnosis not present

## 2021-05-14 DIAGNOSIS — Z91014 Allergy to mammalian meats: Secondary | ICD-10-CM

## 2021-05-14 DIAGNOSIS — I252 Old myocardial infarction: Secondary | ICD-10-CM

## 2021-05-14 DIAGNOSIS — D696 Thrombocytopenia, unspecified: Principal | ICD-10-CM

## 2021-05-14 DIAGNOSIS — R410 Disorientation, unspecified: Secondary | ICD-10-CM | POA: Diagnosis not present

## 2021-05-14 DIAGNOSIS — I152 Hypertension secondary to endocrine disorders: Secondary | ICD-10-CM | POA: Diagnosis present

## 2021-05-14 DIAGNOSIS — Z8249 Family history of ischemic heart disease and other diseases of the circulatory system: Secondary | ICD-10-CM

## 2021-05-14 DIAGNOSIS — I69359 Hemiplegia and hemiparesis following cerebral infarction affecting unspecified side: Secondary | ICD-10-CM

## 2021-05-14 DIAGNOSIS — Z833 Family history of diabetes mellitus: Secondary | ICD-10-CM

## 2021-05-14 DIAGNOSIS — R930 Abnormal findings on diagnostic imaging of skull and head, not elsewhere classified: Secondary | ICD-10-CM | POA: Diagnosis present

## 2021-05-14 DIAGNOSIS — J439 Emphysema, unspecified: Secondary | ICD-10-CM | POA: Diagnosis present

## 2021-05-14 DIAGNOSIS — Z515 Encounter for palliative care: Secondary | ICD-10-CM | POA: Diagnosis not present

## 2021-05-14 DIAGNOSIS — E1159 Type 2 diabetes mellitus with other circulatory complications: Secondary | ICD-10-CM | POA: Diagnosis present

## 2021-05-14 DIAGNOSIS — I878 Other specified disorders of veins: Secondary | ICD-10-CM

## 2021-05-14 DIAGNOSIS — E1169 Type 2 diabetes mellitus with other specified complication: Secondary | ICD-10-CM | POA: Diagnosis present

## 2021-05-14 DIAGNOSIS — R58 Hemorrhage, not elsewhere classified: Secondary | ICD-10-CM | POA: Diagnosis not present

## 2021-05-14 DIAGNOSIS — E119 Type 2 diabetes mellitus without complications: Secondary | ICD-10-CM | POA: Diagnosis not present

## 2021-05-14 DIAGNOSIS — I110 Hypertensive heart disease with heart failure: Secondary | ICD-10-CM | POA: Diagnosis present

## 2021-05-14 DIAGNOSIS — F05 Delirium due to known physiological condition: Secondary | ICD-10-CM | POA: Diagnosis not present

## 2021-05-14 DIAGNOSIS — J9611 Chronic respiratory failure with hypoxia: Secondary | ICD-10-CM | POA: Diagnosis present

## 2021-05-14 DIAGNOSIS — Z9981 Dependence on supplemental oxygen: Secondary | ICD-10-CM

## 2021-05-14 DIAGNOSIS — K8000 Calculus of gallbladder with acute cholecystitis without obstruction: Secondary | ICD-10-CM

## 2021-05-14 DIAGNOSIS — Z955 Presence of coronary angioplasty implant and graft: Secondary | ICD-10-CM

## 2021-05-14 DIAGNOSIS — I5032 Chronic diastolic (congestive) heart failure: Secondary | ICD-10-CM | POA: Diagnosis present

## 2021-05-14 DIAGNOSIS — G259 Extrapyramidal and movement disorder, unspecified: Secondary | ICD-10-CM | POA: Diagnosis present

## 2021-05-14 DIAGNOSIS — Z7189 Other specified counseling: Secondary | ICD-10-CM | POA: Diagnosis not present

## 2021-05-14 DIAGNOSIS — C7801 Secondary malignant neoplasm of right lung: Secondary | ICD-10-CM | POA: Diagnosis present

## 2021-05-14 DIAGNOSIS — Z888 Allergy status to other drugs, medicaments and biological substances status: Secondary | ICD-10-CM

## 2021-05-14 DIAGNOSIS — R7989 Other specified abnormal findings of blood chemistry: Secondary | ICD-10-CM | POA: Diagnosis present

## 2021-05-14 DIAGNOSIS — E860 Dehydration: Secondary | ICD-10-CM | POA: Diagnosis present

## 2021-05-14 DIAGNOSIS — E861 Hypovolemia: Secondary | ICD-10-CM | POA: Diagnosis present

## 2021-05-14 DIAGNOSIS — C3491 Malignant neoplasm of unspecified part of right bronchus or lung: Secondary | ICD-10-CM | POA: Diagnosis present

## 2021-05-14 DIAGNOSIS — Z6822 Body mass index (BMI) 22.0-22.9, adult: Secondary | ICD-10-CM

## 2021-05-14 DIAGNOSIS — K8010 Calculus of gallbladder with chronic cholecystitis without obstruction: Secondary | ICD-10-CM | POA: Diagnosis present

## 2021-05-14 DIAGNOSIS — Z79899 Other long term (current) drug therapy: Secondary | ICD-10-CM

## 2021-05-14 DIAGNOSIS — Z823 Family history of stroke: Secondary | ICD-10-CM

## 2021-05-14 DIAGNOSIS — R54 Age-related physical debility: Secondary | ICD-10-CM | POA: Diagnosis present

## 2021-05-14 DIAGNOSIS — Z7984 Long term (current) use of oral hypoglycemic drugs: Secondary | ICD-10-CM

## 2021-05-14 DIAGNOSIS — J449 Chronic obstructive pulmonary disease, unspecified: Secondary | ICD-10-CM | POA: Diagnosis present

## 2021-05-14 DIAGNOSIS — I2510 Atherosclerotic heart disease of native coronary artery without angina pectoris: Secondary | ICD-10-CM | POA: Diagnosis present

## 2021-05-14 DIAGNOSIS — I69351 Hemiplegia and hemiparesis following cerebral infarction affecting right dominant side: Secondary | ICD-10-CM

## 2021-05-14 DIAGNOSIS — Z7951 Long term (current) use of inhaled steroids: Secondary | ICD-10-CM

## 2021-05-14 DIAGNOSIS — R339 Retention of urine, unspecified: Secondary | ICD-10-CM | POA: Diagnosis present

## 2021-05-14 DIAGNOSIS — D701 Agranulocytosis secondary to cancer chemotherapy: Secondary | ICD-10-CM | POA: Diagnosis present

## 2021-05-14 DIAGNOSIS — C3490 Malignant neoplasm of unspecified part of unspecified bronchus or lung: Secondary | ICD-10-CM | POA: Diagnosis not present

## 2021-05-14 DIAGNOSIS — K529 Noninfective gastroenteritis and colitis, unspecified: Secondary | ICD-10-CM | POA: Diagnosis present

## 2021-05-14 DIAGNOSIS — R627 Adult failure to thrive: Secondary | ICD-10-CM | POA: Diagnosis present

## 2021-05-14 DIAGNOSIS — Z66 Do not resuscitate: Secondary | ICD-10-CM | POA: Diagnosis present

## 2021-05-14 DIAGNOSIS — K219 Gastro-esophageal reflux disease without esophagitis: Secondary | ICD-10-CM | POA: Diagnosis present

## 2021-05-14 DIAGNOSIS — R4701 Aphasia: Secondary | ICD-10-CM | POA: Diagnosis present

## 2021-05-14 DIAGNOSIS — D649 Anemia, unspecified: Secondary | ICD-10-CM

## 2021-05-14 DIAGNOSIS — E7800 Pure hypercholesterolemia, unspecified: Secondary | ICD-10-CM | POA: Diagnosis present

## 2021-05-14 DIAGNOSIS — F17210 Nicotine dependence, cigarettes, uncomplicated: Secondary | ICD-10-CM | POA: Diagnosis present

## 2021-05-14 DIAGNOSIS — K838 Other specified diseases of biliary tract: Secondary | ICD-10-CM | POA: Diagnosis present

## 2021-05-14 DIAGNOSIS — G9341 Metabolic encephalopathy: Secondary | ICD-10-CM | POA: Diagnosis present

## 2021-05-14 DIAGNOSIS — D500 Iron deficiency anemia secondary to blood loss (chronic): Secondary | ICD-10-CM

## 2021-05-14 DIAGNOSIS — Z781 Physical restraint status: Secondary | ICD-10-CM

## 2021-05-14 DIAGNOSIS — H04123 Dry eye syndrome of bilateral lacrimal glands: Secondary | ICD-10-CM | POA: Diagnosis present

## 2021-05-14 DIAGNOSIS — Z923 Personal history of irradiation: Secondary | ICD-10-CM

## 2021-05-14 DIAGNOSIS — Z825 Family history of asthma and other chronic lower respiratory diseases: Secondary | ICD-10-CM

## 2021-05-14 LAB — CBC WITH DIFFERENTIAL/PLATELET
Abs Immature Granulocytes: 0 10*3/uL (ref 0.00–0.07)
Basophils Absolute: 0 10*3/uL (ref 0.0–0.1)
Basophils Relative: 0 %
Eosinophils Absolute: 0 10*3/uL (ref 0.0–0.5)
Eosinophils Relative: 0 %
HCT: 18.4 % — ABNORMAL LOW (ref 39.0–52.0)
Hemoglobin: 6.1 g/dL — CL (ref 13.0–17.0)
Immature Granulocytes: 0 %
Lymphocytes Relative: 55 %
Lymphs Abs: 0.3 10*3/uL — ABNORMAL LOW (ref 0.7–4.0)
MCH: 28.4 pg (ref 26.0–34.0)
MCHC: 33.2 g/dL (ref 30.0–36.0)
MCV: 85.6 fL (ref 80.0–100.0)
Monocytes Absolute: 0.2 10*3/uL (ref 0.1–1.0)
Monocytes Relative: 32 %
Neutro Abs: 0.1 10*3/uL — CL (ref 1.7–7.7)
Neutrophils Relative %: 13 %
Platelets: 9 10*3/uL — CL (ref 150–400)
RBC: 2.15 MIL/uL — ABNORMAL LOW (ref 4.22–5.81)
RDW: 21.8 % — ABNORMAL HIGH (ref 11.5–15.5)
WBC: 0.4 10*3/uL — CL (ref 4.0–10.5)
nRBC: 0 % (ref 0.0–0.2)

## 2021-05-14 LAB — COMPREHENSIVE METABOLIC PANEL
ALT: 27 U/L (ref 0–44)
AST: 37 U/L (ref 15–41)
Albumin: 2.8 g/dL — ABNORMAL LOW (ref 3.5–5.0)
Alkaline Phosphatase: 147 U/L — ABNORMAL HIGH (ref 38–126)
Anion gap: 8 (ref 5–15)
BUN: 44 mg/dL — ABNORMAL HIGH (ref 8–23)
CO2: 20 mmol/L — ABNORMAL LOW (ref 22–32)
Calcium: 7.7 mg/dL — ABNORMAL LOW (ref 8.9–10.3)
Chloride: 109 mmol/L (ref 98–111)
Creatinine, Ser: 1.73 mg/dL — ABNORMAL HIGH (ref 0.61–1.24)
GFR, Estimated: 42 mL/min — ABNORMAL LOW (ref >60)
Glucose, Bld: 133 mg/dL — ABNORMAL HIGH (ref 70–99)
Potassium: 3.8 mmol/L (ref 3.5–5.1)
Sodium: 137 mmol/L (ref 135–145)
Total Bilirubin: 2.6 mg/dL — ABNORMAL HIGH (ref 0.3–1.2)
Total Protein: 6.5 g/dL (ref 6.5–8.1)

## 2021-05-14 LAB — RESP PANEL BY RT-PCR (FLU A&B, COVID) ARPGX2
Influenza A by PCR: NEGATIVE
Influenza B by PCR: NEGATIVE
SARS Coronavirus 2 by RT PCR: NEGATIVE

## 2021-05-14 LAB — LACTIC ACID, PLASMA: Lactic Acid, Venous: 0.8 mmol/L (ref 0.5–1.9)

## 2021-05-14 LAB — PREPARE RBC (CROSSMATCH)

## 2021-05-14 MED ORDER — SODIUM CHLORIDE 0.9 % IV SOLN
1.0000 g | Freq: Two times a day (BID) | INTRAVENOUS | Status: DC
  Administered 2021-05-15: 1 g via INTRAVENOUS
  Filled 2021-05-14 (×3): qty 20

## 2021-05-14 MED ORDER — SODIUM CHLORIDE 0.9% IV SOLUTION: Freq: Once | INTRAVENOUS | Status: DC

## 2021-05-14 MED ORDER — DULOXETINE HCL 30 MG PO CPEP: 30.0000 mg | ORAL_CAPSULE | Freq: Every day | ORAL | Status: DC

## 2021-05-14 MED ORDER — SODIUM CHLORIDE 0.9 % IV BOLUS
1000.0000 mL | Freq: Once | INTRAVENOUS | Status: AC
Start: 2021-05-14 — End: 2021-05-14
  Administered 2021-05-14: 1000 mL via INTRAVENOUS

## 2021-05-14 MED ORDER — IOHEXOL 300 MG/ML  SOLN
80.0000 mL | Freq: Once | INTRAMUSCULAR | Status: AC | PRN
  Administered 2021-05-14: 80 mL via INTRAVENOUS

## 2021-05-14 MED ORDER — ATORVASTATIN CALCIUM 10 MG PO TABS: 20.0000 mg | ORAL_TABLET | Freq: Every day | ORAL | Status: DC

## 2021-05-14 MED ORDER — ONDANSETRON HCL 4 MG/2ML IJ SOLN
4.0000 mg | Freq: Four times a day (QID) | INTRAMUSCULAR | Status: DC | PRN
Start: 2021-05-14 — End: 2021-05-18
  Administered 2021-05-17: 4 mg via INTRAVENOUS
  Filled 2021-05-14: qty 2

## 2021-05-14 MED ORDER — SODIUM CHLORIDE 0.9 % IV SOLN
1.0000 g | Freq: Once | INTRAVENOUS | Status: AC
  Administered 2021-05-14: 1 g via INTRAVENOUS
  Filled 2021-05-14: qty 1

## 2021-05-14 MED ORDER — ONDANSETRON HCL 4 MG PO TABS: 4.0000 mg | ORAL_TABLET | Freq: Four times a day (QID) | ORAL | Status: DC | PRN

## 2021-05-14 MED ORDER — ALBUTEROL SULFATE (2.5 MG/3ML) 0.083% IN NEBU
2.5000 mg | INHALATION_SOLUTION | RESPIRATORY_TRACT | Status: DC | PRN
  Administered 2021-05-16 – 2021-05-17 (×2): 2.5 mg via RESPIRATORY_TRACT
  Filled 2021-05-14 (×2): qty 3

## 2021-05-14 MED ORDER — SODIUM CHLORIDE 0.9 % IV SOLN: 500.0000 mg | Freq: Once | INTRAVENOUS | Status: DC

## 2021-05-14 MED ORDER — ACETAMINOPHEN 325 MG PO TABS: 650.0000 mg | ORAL_TABLET | Freq: Four times a day (QID) | ORAL | Status: DC | PRN

## 2021-05-14 MED ORDER — SODIUM CHLORIDE 0.9% FLUSH
3.0000 mL | Freq: Two times a day (BID) | INTRAVENOUS | Status: DC
  Administered 2021-05-16 – 2021-05-17 (×3): 3 mL via INTRAVENOUS

## 2021-05-14 MED ORDER — SODIUM CHLORIDE 0.9 % IV SOLN
10.0000 mL/h | Freq: Once | INTRAVENOUS | Status: AC
  Administered 2021-05-16: 10 mL/h via INTRAVENOUS

## 2021-05-14 MED ORDER — DIPHENHYDRAMINE HCL 25 MG PO CAPS: 25.0000 mg | ORAL_CAPSULE | Freq: Once | ORAL | Status: DC

## 2021-05-14 MED ORDER — MOMETASONE FURO-FORMOTEROL FUM 200-5 MCG/ACT IN AERO
2.0000 | INHALATION_SPRAY | Freq: Two times a day (BID) | RESPIRATORY_TRACT | Status: DC
  Filled 2021-05-14: qty 8.8

## 2021-05-14 MED ORDER — NORTRIPTYLINE HCL 25 MG PO CAPS
25.0000 mg | ORAL_CAPSULE | Freq: Every day | ORAL | Status: DC
Start: 2021-05-15 — End: 2021-05-17
  Filled 2021-05-14 (×2): qty 1

## 2021-05-14 MED ORDER — SODIUM CHLORIDE 0.9 % IV SOLN: INTRAVENOUS | Status: AC

## 2021-05-14 MED ORDER — ACETAMINOPHEN 650 MG RE SUPP: 650.0000 mg | Freq: Four times a day (QID) | RECTAL | Status: DC | PRN

## 2021-05-14 MED ORDER — ACETAMINOPHEN 325 MG PO TABS: 650.0000 mg | ORAL_TABLET | Freq: Once | ORAL | Status: DC

## 2021-05-14 NOTE — H&P (Signed)
?History and Physical  ? ? ?Jonathon [name] JEH:[id number] DOB: 07/12/1950 DOA: 04/19/2021 ? ?PCP: [name], MD  ?Patient coming from: Home ? ?I have personally briefly reviewed patient's old medical records in [place] ? ?Chief Complaint: Diarrhea, abnormal labs ? ?HPI: ?Jonathon [name] is a 71 y.o. 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  History supplemented by patient's spouse at bedside. ? ?Patient undergoing active treatment for small cell right-sided lung cancer after recent progression and recurrence.  He is receiving treatment with Zepzelca every 3 weeks with last treatment 05/03/2021. ? ?Blood work 05/12/2021 showed worsening leukopenia with WBC 0.3 and ANC 0.1, hemoglobin 7.3, platelets 16,000. ? ?Over the last several days patient has had functional decline with generalized weakness.  Also for the last 2 days he has had diarrhea.  He has not had any obvious bleeding.  He has had poor oral intake.  He has been lethargic.  He had some reported right upper abdominal pain.  He has recently been on doxycycline for possible dental infection. ? ?ED Course  Labs/Imaging on admission: I have personally reviewed following labs and imaging studies. ? ?Initial vitals showed BP 122/82, pulse 82, RR 18, temp 98.7 ?F, SPO2 90% on 2 L supplemental O2 via [place]. ? ?Labs show WBC 0.4, ANC 0.1, hemoglobin 6.1, platelets 9000, BUN 44, creatinine 1.73 (baseline 0.8-1.0), sodium 137, potassium 3.8, bicarb 20, serum glucose 133, AST 37, ALT 27, alk phos 147, total bilirubin 2.6, lactic acid 0.8, lipase pending. ? ?Blood cultures collected and pending.  C. difficile and GI pathogen panel  pending collection.  SARS-CoV-2 and influenza PCR negative. ? ?Portable chest x-ray shows advanced bullous emphysema, hazy opacities within the mid to lower lung with scattered areas of nodularity, similar to prior. ? ?CT abdomen/pelvis with contrast showed cholelithiasis with markedly distended gallbladder and mild intrahepatic biliary ductal dilatation.  Common duct also prominent.  Focal fluid-filled bowel loops suggestive of enteritis noted. ? ?Follow-up RUQ ultrasound showed dilated common duct with intrahepatic ductal dilatation.  Appearance is new compared to February 2023.  Cholelithiasis without sonographic findings to suggest acute cholecystitis noted. ? ?CT head without contrast showed findings along the anterior medial aspect of the cerebellum on the left which may represent a subacute infarct.  Chronic left frontal lobe infarct seen. ? ?Patient was given 1 L normal saline.  EDP consulted on-call oncology, Dr. Marin Olp, who recommended blood transfusion followed by platelet transfusion, and empiric meropenem.  Oncology will follow.  The hospitalist service was consulted to admit for further evaluation and management. ? ?Review of Systems: All systems reviewed and are negative except as documented in history of present illness above. ? ? ?Past Medical History:  ?Diagnosis Date  ? Cancer [facility])   ? CHF (congestive heart failure) ([place])   ? Chronic cough   ? COPD, severe ([place])   ? PT DENIES REGULAR DAILY INHALER ANY PRN  ? Coronary artery disease   ? CVA (cerebral vascular accident) ([place]) 02/22/2011  ? LEFT BRAIN STEM PONTINE HEMORRHAGE--  RIGHT SIDED WEAKNESS  ? Diabetes mellitus without complication ([place])   ? type 2  ? Dyspnea   ? GERD (gastroesophageal reflux disease)   ? Harsh voice  quality   ? PT STATES NORMAL FOR HIM  ? High cholesterol   ? History of CHF (congestive heart failure) MONITORED BY DR Coletta Memos  ? DIASTOLIC  ? History of radiation therapy 03/13/19-03/20/19  ? SBRT Right Lung; Dr. Gery Pray   ? History of radiation therapy 02/17/20-02/28/20  ? Whole brain radiation; Dr. Gery Pray  ? History of radiation therapy 05/25/2020  ? 05/18/2020-05/25/2020  SBRT Right Lung; Dr. Gery Pray  ? Hydrocele, left   ? Hypertension   ? NSTEMI (non-ST elevated myocardial infarction) ([place]) 12/2015  ? Pre-operative clearance   ? GIVEN BY DR Coletta Memos  ? Short of breath on exertion   ? Smoker   ? Weakness of right side of body   ? S/P CVA   FEB 2013  ? ? ?Past Surgical History:  ?Procedure Laterality Date  ? ABDOMINAL ANGIOGRAM  09/04/2012  ? Procedure: ABDOMINAL ANGIOGRAM;  Surgeon: Laverda Page, MD;  Location: [facility] LAB;  Service: Cardiovascular;;  ? CARDIAC CATHETERIZATION  05-02-2011  DR Johnsie Cancel  ? MILD NONOBSTRUCTIVE CAD/ LAD, OM , AND D1/ EF 60^  ? CARDIAC CATHETERIZATION  AUG 2000  ? NON-OBSTRUTIVE CAD/ EF 68%/ 25% PROXIMAL LAD/ 20% MID CIRCUMFLEX  ? CARDIAC CATHETERIZATION [name] 12/23/2015  ? Procedure: Left Heart Cath and Coronary Angiography;  Surgeon: Adrian Prows, MD;  Location: [facility] CV LAB;  Service: Cardiovascular;  Laterality: [name];  ? CARDIAC CATHETERIZATION [name] 12/24/2015  ? Procedure: Coronary Stent Intervention;  Surgeon: Adrian Prows, MD;  Location: [facility] CV LAB;  Service: Cardiovascular;  Laterality: [name];  ? CORONARY ANGIOPLASTY    ? CORONARY STENT PLACEMENT  12/24/2015  ?  PTCA and stenting of the distal RCA   ? HYDROCELE EXCISION Left 04/24/2012  ? Procedure: HYDROCELECTOMY ADULT;  Surgeon: Fredricka Bonine, MD;  Location: [facility];  Service: Urology;  Laterality: Left;  90 mins req for this case ? ? ?  ? [place]  ? LEFT AND RIGHT HEART CATHETERIZATION WITH CORONARY ANGIOGRAM [name] 09/04/2012  ? Procedure: LEFT AND RIGHT HEART CATHETERIZATION WITH CORONARY ANGIOGRAM;  Surgeon: Laverda Page, MD;  Location: [facility] CATH LAB;  Service: Cardiovascular;  Laterality: [name];  ? LEFT HEART CATHETERIZATION WITH CORONARY ANGIOGRAM [name] 05/02/2011  ? Procedure: LEFT  HEART CATHETERIZATION WITH CORONARY ANGIOGRAM;  Surgeon: Josue Hector, MD;  Location[facility] CATH LAB;  Service: Cardiovascular;  Laterality: [name];  ? THORACOTOMY/LOBECTOMY Right 03-06-2008  ? AND STAPLING OF BLEBS FOR SPONTANOUS PNEUMOTHORAX  ? TRANSTHORACIC ECHOCARDIOGRAM  02-17-2011  ? MODERATE LVH/ LVSF NORMAL/ EF 08-14%/ GRADE I DIASTOLIC DYSFUNCTION  ? ? ?Social History: ? reports that he has been smoking cigarettes. He has a 13.25 pack-year smoking history. He has never used smokeless tobacco. He reports that he does not currently use alcohol. He reports that he does not use drugs. ? ?Allergies  ?Allergen Reactions  ? Pork-Derived Products Other (See Comments)  ?  Muslim   ? Atorvastatin Other (See Comments)  ?  Severe constipation  ? ? ?Family History  ?Problem Relation Age of Onset  ? Hypertension Mother   ? Stroke Mother 27  ? Lung disease Father 33  ?     died of "colapsed lung" per patient  ? Asthma Father   ? Diabetes Sister   ? Colon cancer Neg Hx   ? Stomach cancer Neg Hx   ? ? ? ?Prior to Admission medications   ?Medication Sig Start Date End Date  Taking? Authorizing Provider  ?albuterol (PROVENTIL) (2.5 MG/3ML) 0.083% nebulizer solution Take 2.5 mg by nebulization every 4 (four) hours as needed for wheezing or shortness of breath.   Yes [provider]  ?Albuterol Sulfate (PROAIR RESPICLICK) 225 (90 BASE) MCG/ACT AEPB Inhale 2 puffs into the lungs 4 (four) times daily as needed. ?Patient taking differently: Inhale 2 puffs into the lungs 4 (four) times daily as needed (wheezing). 02/24/14  Yes Parrett, Tammy S, NP  ?amLODipine (NORVASC) 10 MG tablet TAKE 1 TABLET BY MOUTH ONCE A DAY ?Patient taking differently: Take 10 mg by mouth every evening. 02/15/21  Yes   ?atorvastatin (LIPITOR) 20 MG tablet Take one tablet by mouth daily. ?Patient taking differently: Take 20 mg by mouth daily. 02/23/21  Yes   ?bisoprolol (ZEBETA) 5 MG tablet Take one tablet (5 mg dose) by mouth daily. ?Patient taking  differently: Take 5 mg by mouth every evening. 11/12/20  Yes   ?cloNIDine (CATAPRES) 0.3 MG tablet TAKE 1 TABLET BY MOUTH TWICE DAILY 05/19/20  Yes   ?cyclobenzaprine (FLEXERIL) 5 MG tablet TAKE 1 TABLET BY MOUTH

## 2021-05-14 NOTE — Assessment & Plan Note (Signed)
Hold home metformin.  Monitor CBGs and add SSI if needed. ?

## 2021-05-14 NOTE — Assessment & Plan Note (Signed)
CT imaging and RUQ ultrasound show CBD dilatation with intrahepatic ductal dilatation.  Cholelithiasis noted without evidence of acute cholecystitis.  Patient does have RUQ tenderness on exam with elevated alk phos and total bilirubin on admission. ?-On empiric meropenem as above ?-Obtain MRCP ?-Trend liver enzymes ?

## 2021-05-14 NOTE — ED Triage Notes (Signed)
[facility] reports pt coming from home c/o weakness and diarrhea. Pt is a lung CA pt and receiving chemo. Upon [facility] arrival pt pulse ox 84%. Pt denies any pain. ?

## 2021-05-14 NOTE — Telephone Encounter (Signed)
Pt no showed for his filgrastin injections yesterday and today. Dr Julien Nordmann ordered 3 doses. ? ?Wife stated he is too sick to come for the GCSF injections. He has diarrhea, is very weak and laying in bed. She gave him imodium last night and he has not had any stool /diarrhea today. ? ?I told wife to call EMS to take pt to ED due to neutropenia and symptoms. ?

## 2021-05-14 NOTE — Assessment & Plan Note (Signed)
Secondary to hypovolemia from GI losses.  Continue IV fluid hydration overnight.  Hold home losartan and metformin. ?

## 2021-05-14 NOTE — Hospital Course (Signed)
Jonathon [name] is a 71 y.o. 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 ?

## 2021-05-14 NOTE — ED Notes (Signed)
US at bedside

## 2021-05-14 NOTE — Assessment & Plan Note (Signed)
CT head shows possible subacute infarct at the anteromedial aspect of the left cerebellum. ?-Obtain MRI brain ?-Continue neurochecks ?

## 2021-05-14 NOTE — Assessment & Plan Note (Signed)
Worsening pancytopenia on admission with WBC 0.4, ANC 0.1, hemoglobin 6.1, platelets 9000.  He denies any obvious bleeding.  May be adverse effect of Zepzelca. ?-Oncology to follow, appreciate assistance ?-Receiving 2 unit PRBC transfusion ?-1 unit platelet transfusion ?-Started on empiric meropenem ?-Follow blood cultures ?-Avoid all blood thinners ?

## 2021-05-14 NOTE — Assessment & Plan Note (Signed)
Continue atorvastatin

## 2021-05-14 NOTE — Assessment & Plan Note (Signed)
Continue Dulera, albuterol as needed, supplemental oxygen as needed. ?

## 2021-05-14 NOTE — Assessment & Plan Note (Signed)
Patient with 2 days of diarrhea and changes suggestive of enteritis on CT imaging.  Appears significantly volume overloaded on admission due to GI losses.  May be adverse effect of his cancer treatment.  Also has been on oral antibiotics with doxycycline recently. ?-Continue IV fluid hydration overnight ?-Follow C. difficile and GI pathogen panels ?-Keep n.p.o. tonight ?

## 2021-05-14 NOTE — Assessment & Plan Note (Signed)
Follows with oncology, Dr. [name].  On active treatment with Zepzelca. ?

## 2021-05-14 NOTE — ED Provider Notes (Addendum)
[facility]-EMERGENCY DEPT Provider Note   CSN: [id number] Arrival date & time: 05/14/21  1454     History  Chief Complaint  Patient presents with   Weakness    Jonathon [name] is a 71 y.o. male.  Patient followed by Dr. Shirline Frees hematology oncology for metastatic small cell lung carcinoma originally diagnosed in August 2021 original staging was T3 N2 M0 now receiving chemotherapy, zepzelca, status post 2 cycles.  Is getting treated every 3 weeks.  On Tuesday started with diarrhea.  Had about 4 episodes Wednesday was to Thursday none Friday none not eating and drinking well.  Feeling very fatigued.  Little more confused than usual.  Labs from 2 days ago showed decreased white blood cell count and decreased platelets.  In addition patient was on antibiotics about 2 weeks ago for oral infection.  The diarrhea is very watery.  Not black in color no blood.  Brought in by EMS they said that the pulse ox at home was 84% but sats here were 92%.  Patient is on 2 L of oxygen.  Not normally on oxygen at home past medical history sniffing for hypotension and a smoker who history of congestive heart failure COPD severe weakness on the right side of his body status post CVA in 2013 patient's wife says that he does not really walk on his own diabetes without complication gastroesophageal reflux disease.  Had a non-STEMI in 2017 history of radiation treatment past surgical history significant for right thoracotomy lobectomy in 2010 this was for a bleb and spontaneous pneumothorax.  Last cardiac catheterization was in 2017.  Patient quit smoking in 2019.      Home Medications Prior to Admission medications   Medication Sig Start Date End Date Taking? Authorizing Provider  albuterol (PROVENTIL) (2.5 MG/3ML) 0.083% nebulizer solution Take 2.5 mg by nebulization every 4 (four) hours as needed for wheezing or shortness of breath.   Yes [provider]  Albuterol Sulfate  (PROAIR RESPICLICK) 108 (90 BASE) MCG/ACT AEPB Inhale 2 puffs into the lungs 4 (four) times daily as needed. Patient taking differently: Inhale 2 puffs into the lungs 4 (four) times daily as needed (wheezing). 02/24/14  Yes Parrett, Tammy S, NP  amLODipine (NORVASC) 10 MG tablet TAKE 1 TABLET BY MOUTH ONCE A DAY Patient taking differently: Take 10 mg by mouth every evening. 02/15/21  Yes   atorvastatin (LIPITOR) 20 MG tablet Take one tablet by mouth daily. Patient taking differently: Take 20 mg by mouth daily. 02/23/21  Yes   bisoprolol (ZEBETA) 5 MG tablet Take one tablet (5 mg dose) by mouth daily. Patient taking differently: Take 5 mg by mouth every evening. 11/12/20  Yes   cloNIDine (CATAPRES) 0.3 MG tablet TAKE 1 TABLET BY MOUTH TWICE DAILY 05/19/20  Yes   cyclobenzaprine (FLEXERIL) 5 MG tablet TAKE 1 TABLET BY MOUTH AT BEDTIME AS NEEDED FOR MUSCLE SPASMS Patient taking differently: Take 5 mg by mouth at bedtime. 04/27/21  Yes   DULoxetine (CYMBALTA) 30 MG capsule Take one capsule (30 mg dose) by mouth daily. Patient taking differently: Take 30 mg by mouth at bedtime. 11/12/20  Yes   gabapentin (NEURONTIN) 400 MG capsule TAKE 2 CAPSULES BY MOUTH THREE TIMES A DAY Patient taking differently: Take 1,200 mg by mouth 2 (two) times daily. 03/30/21  Yes   losartan (COZAAR) 100 MG tablet TAKE 1 TABLET BY MOUTH DAILY. Patient taking differently: Take 100 mg by mouth daily. 01/12/21 01/12/22 Yes Yates Decamp, MD  metFORMIN (GLUCOPHAGE) 500 MG tablet TAKE 1 TABLET BY MOUTH ONCE A DAY WITH BREAKFAST Patient taking differently: Take 500 mg by mouth daily with breakfast. 05/19/20  Yes   nitroGLYCERIN (NITROSTAT) 0.4 MG SL tablet PLACE 1 TABLET UNDER THE TONGUE EVERY 5  MINUTES AS NEEDED AS DIRECTED. Patient taking differently: 0.4 mg every 5 (five) minutes as needed for chest pain. 11/16/20 11/16/21 Yes Yates Decamp, MD  nortriptyline (PAMELOR) 25 MG capsule TAKE 1 CAPSULE BY MOUTH EVERY NIGHT AT BEDTIME 03/16/21  Yes    ondansetron (ZOFRAN) 8 MG tablet Take 0.5 tablets (4 mg total) by mouth every 6 (six) hours. Patient taking differently: Take 4 mg by mouth every 4 (four) hours as needed for nausea or vomiting. 04/23/21  Yes Al Decant, PA-C  pantoprazole (PROTONIX) 20 MG tablet TAKE 1 TABLET BY MOUTH DAILY Patient taking differently: Take 20 mg by mouth every evening. 03/15/21 03/15/22 Yes Yates Decamp, MD  polyethylene glycol (MIRALAX) 17 g packet Take 17 g by mouth daily as needed for mild constipation. 03/23/21  Yes Standley Brooking, MD  prochlorperazine (COMPAZINE) 10 MG tablet Take 1 tablet by mouth every 6  hours as needed for nausea or vomiting. 05/03/21  Yes Heilingoetter, Cassandra L, PA-C  SYMBICORT 160-4.5 MCG/ACT inhaler INHALE 2 PUFFS INTO THE LUNGS TWICE DAILY 11/12/20  Yes   vitamin B-12 (CYANOCOBALAMIN) 1000 MCG tablet Take 1,000 mcg by mouth daily.   Yes [provider]  doxycycline (VIBRA-TABS) 100 MG tablet Take 1 tablet (100 mg total) by mouth 2 (two) times daily. Patient taking differently: Take 100 mg by mouth 2 (two) times daily. Start date ; 05/03/21 05/03/21   Heilingoetter, Cassandra L, PA-C  methylPREDNISolone (MEDROL DOSEPAK) 4 MG TBPK tablet Use as instructed. Patient not taking: Reported on 06-08-21 04/12/21   Si Gaul, MD  potassium chloride SA (KLOR-CON M) 20 MEQ tablet Take 1 tablet (20 mEq total) by mouth 2 (two) times daily. Patient not taking: Reported on Jun 08, 2021 05/03/21   Heilingoetter, Cassandra L, PA-C  senna (SENOKOT) 8.6 MG TABS tablet Take 1 tablet (8.6 mg total) by mouth at bedtime as needed for mild constipation. Patient not taking: Reported on 2021/06/08 03/23/21   Standley Brooking, MD  tamsulosin [facility]) 0.4 MG CAPS capsule Take one capsule (0.4 mg dose) by mouth daily. Patient not taking: Reported on 06/08/2021 05/13/21     Tiotropium Bromide Monohydrate (SPIRIVA RESPIMAT) 2.5 MCG/ACT AERS Inhale two puffs into the lungs as needed. Patient not  taking: Reported on 06/08/2021 12/03/20         Allergies    Pork-derived products and Atorvastatin    Review of Systems   Review of Systems  Constitutional:  Positive for fatigue. Negative for chills and fever.  HENT:  Negative for ear pain and sore throat.   Eyes:  Negative for pain and visual disturbance.  Respiratory:  Negative for cough and shortness of breath.   Cardiovascular:  Negative for chest pain and palpitations.  Gastrointestinal:  Positive for abdominal pain and diarrhea. Negative for blood in stool and vomiting.  Genitourinary:  Negative for dysuria and hematuria.  Musculoskeletal:  Negative for arthralgias and back pain.  Skin:  Negative for color change and rash.  Neurological:  Positive for weakness. Negative for seizures and syncope.  All other systems reviewed and are negative.  Physical Exam Updated Vital Signs BP (!) 152/103 (BP Location: Left Arm)   Pulse 87   Temp 98.7 F (37.1 C) (Oral)  Resp (!) 26   Ht 1.829 m (6')   Wt 74.8 kg   SpO2 91%   BMI 22.38 kg/m  Physical Exam Vitals and nursing note reviewed.  Constitutional:      General: He is not in acute distress.    Appearance: Normal appearance. He is well-developed.  HENT:     Head: Normocephalic and atraumatic.  Eyes:     Extraocular Movements: Extraocular movements intact.     Conjunctiva/sclera: Conjunctivae normal.     Pupils: Pupils are equal, round, and reactive to light.  Cardiovascular:     Rate and Rhythm: Normal rate and regular rhythm.     Heart sounds: No murmur heard. Pulmonary:     Effort: Pulmonary effort is normal. No respiratory distress.     Breath sounds: Normal breath sounds.  Abdominal:     Palpations: Abdomen is soft.     Tenderness: There is no abdominal tenderness.  Musculoskeletal:        General: No swelling.     Cervical back: Normal range of motion and neck supple.  Skin:    General: Skin is warm and dry.     Capillary Refill: Capillary refill takes less  than 2 seconds.  Neurological:     General: No focal deficit present.     Mental Status: He is alert. Mental status is at baseline.  Psychiatric:        Mood and Affect: Mood normal.    ED Results / Procedures / Treatments   Labs (all labs ordered are listed, but only abnormal results are displayed) Labs Reviewed  CBC WITH DIFFERENTIAL/PLATELET - Abnormal; Notable for the following components:      Result Value   WBC 0.4 (*)    RBC 2.15 (*)    Hemoglobin 6.1 (*)    HCT 18.4 (*)    RDW 21.8 (*)    Platelets 9 (*)    Neutro Abs 0.1 (*)    Lymphs Abs 0.3 (*)    All other components within normal limits  COMPREHENSIVE METABOLIC PANEL - Abnormal; Notable for the following components:   CO2 20 (*)    Glucose, Bld 133 (*)    BUN 44 (*)    Creatinine, Ser 1.73 (*)    Calcium 7.7 (*)    Albumin 2.8 (*)    Alkaline Phosphatase 147 (*)    Total Bilirubin 2.6 (*)    GFR, Estimated 42 (*)    All other components within normal limits  CULTURE, BLOOD (ROUTINE X 2)  CULTURE, BLOOD (ROUTINE X 2)  C DIFFICILE QUICK SCREEN W PCR REFLEX    LACTIC ACID, PLASMA  LIPASE, BLOOD  TYPE AND SCREEN    EKG EKG Interpretation  Date/Time:  Friday May 17, 2021 15:22:18 EDT Ventricular Rate:  79 PR Interval:  151 QRS Duration: 126 QT Interval:  413 QTC Calculation: 474 R Axis:   -51 Text Interpretation: Sinus rhythm Left bundle branch block No significant change since last tracing Confirmed by Vanetta Mulders [phone]) on May 17, 2021 4:58:29 PM  Radiology CT Head Wo Contrast  Result Date: May 17, 2021 CLINICAL DATA:  History of lung cancer presenting with altered mental status. EXAM: CT HEAD WITHOUT CONTRAST TECHNIQUE: Contiguous axial images were obtained from the base of the skull through the vertex without intravenous contrast. RADIATION DOSE REDUCTION: This exam was performed according to the departmental dose-optimization program which includes automated exposure control, adjustment of the  mA and/or kV according to patient size and/or use of iterative  reconstruction technique. COMPARISON:  Plain brain CT, dated August 29, 2011 and MR head dated April 21, 2021 FINDINGS: Brain: There is moderate severity cerebral atrophy with widening of the extra-axial spaces and ventricular dilatation. There are areas of decreased attenuation within the white matter tracts of the supratentorial brain, consistent with microvascular disease changes. Small bilateral chronic basal ganglia lacunar infarcts are noted. A chronic left frontal lobe infarct is seen. A 2.3 cm x 1.9 cm area of white matter low attenuation is seen along the anteromedial aspect of the cerebellum on the left (axial CT images 7 and 8, CT series 3). This is not clearly visualized on the prior MRI. There is no evidence of associated mass effect or midline shift. Vascular: No hyperdense vessel or unexpected calcification. Skull: Normal. Negative for fracture or focal lesion. Sinuses/Orbits: No acute finding. Other: None. IMPRESSION: 1. Findings along the anteromedial aspect of the cerebellum on the left, as described above, which may represent sequelae associated with a subacute infarct. MRI correlation is recommended. 2. Chronic left frontal lobe infarct. 3. Generalized cerebral atrophy with chronic white matter small vessel ischemia. Electronically Signed   By: Aram Candela [name]   On: 05/14/2021 18:26   CT Abdomen Pelvis W Contrast  Result Date: 05/14/2021 CLINICAL DATA:  Abdominal pain, acute nonlocalized. Receiving chemotherapy and having acute diarrhea. EXAM: CT ABDOMEN AND PELVIS WITH CONTRAST TECHNIQUE: Multidetector CT imaging of the abdomen and pelvis was performed using the standard protocol following bolus administration of intravenous contrast. RADIATION DOSE REDUCTION: This exam was performed according to the departmental dose-optimization program which includes automated exposure control, adjustment of the mA and/or kV according to  patient size and/or use of iterative reconstruction technique. CONTRAST:  80mL OMNIPAQUE IOHEXOL 300 MG/ML  SOLN COMPARISON:  None. FINDINGS: Lower chest: Emphysematous changes with large bibasilar emphysematous bulla. Right basilar atelectasis. Hepatobiliary: No focal liver abnormality is seen. Gallbladder is distended. 1 cm calculus in the dependent gallbladder. Mild intrahepatic biliary ductal dilatation. There is also prominence of the common duct measuring approximately 1.3 cm at the level of the pancreatic head. Pancreas: Unremarkable. No pancreatic ductal dilatation or surrounding inflammatory changes. Spleen: Normal in size without focal abnormality. Adrenals/Urinary Tract: Adrenal glands are unremarkable. Stable simple cyst in the upper pole of the left kidney. No evidence of nephrolithiasis or hydronephrosis. No ureteral calculus. Bladder is unremarkable. Stomach/Bowel: Moderate-sized hiatal hernia. Stomach is within normal limits. Appendix not identified. Focal mildly dilated fluid-filled small bowel loops without significant wall enhancement. (Axial image 57; coronal image 46). Vascular/Lymphatic: Abdominal aorta is normal in caliber with advanced atherosclerotic calcifications and a tortuous course. No lymphadenopathy. Reproductive: Prostate is unremarkable. Other: No abdominal wall hernia or abnormality. No abdominopelvic ascites. Musculoskeletal: Mild degenerate disc disease of the lumbar spine. No acute osseous abnormality. IMPRESSION: 1. Cholelithiasis with markedly distended gallbladder and mild intrahepatic biliary ductal dilatation. The common duct is also prominent but tapers smoothly without appreciable calculus. Right upper quadrant sonogram could be obtained if there is clinical concern for acute cholecystitis. 2. Focal fluid-filled bowel loops which are normal in caliber, which may suggest enteritis. No evidence of bowel wall thickening or pneumatosis. 3.  Advanced emphysematous changes. 4.   Atherosclerotic disease of abdominal aorta and branch vessels. 5.  Additional chronic findings as above. Electronically Signed   By: Larose Hires D.O.   On: 05/14/2021 18:29   US Abdomen Limited  Result Date: 05/14/2021 CLINICAL DATA:  Cholelithiasis EXAM: ULTRASOUND ABDOMEN LIMITED RIGHT UPPER QUADRANT COMPARISON:  CT  abdomen/pelvis dated 05/06/2021 FINDINGS: Gallbladder: Distended gallbladder with 60 mm gallstone. No gallbladder wall thickening or pericholecystic fluid. Negative sonographic Murphy's sign. Common bile duct: Diameter: 15 mm. Associated intrahepatic ductal dilatation. This appearance is new from February 2023, suggesting a nonvisualized common duct stone. Liver: No focal lesion identified. Within normal limits in parenchymal echogenicity. Portal vein is patent on color Doppler imaging with normal direction of blood flow towards the liver. Other: None. IMPRESSION: Dilated common duct with intrahepatic ductal dilatation. This appearance is new from February 2023, raising concern for a nonvisualized common duct stone. Consider MRCP or ERCP for further evaluation. Cholelithiasis, without associated sonographic findings to suggest acute cholecystitis. Electronically Signed   By: Charline Bills [name]   On: 05/14/2021 19:58   DG Chest Port 1 View  Result Date: 05/14/2021 CLINICAL DATA:  Weakness.  History of lung cancer EXAM: PORTABLE CHEST 1 VIEW COMPARISON:  03/20/2021, 03/04/2021 FINDINGS: Stable cardiomediastinal contours. Advanced bullous emphysema with upper lobe predominance. Hazy opacities within the mid to lower lung fields with scattered areas of nodularity. No pleural effusion. No evidence of pneumothorax. IMPRESSION: Similar radiographic appearance of the lungs with hazy opacities within the mid to lower lung fields with scattered areas of nodularity. Findings may represent a combination of atypical/viral infection and pulmonary metastatic disease. Electronically Signed   By: Duanne Guess D.O.   On: 05/14/2021 17:04    Procedures Procedures    Medications Ordered in ED Medications  0.9 %  sodium chloride infusion ( Intravenous Restarted 05/14/21 2028)  meropenem (MERREM) 500 mg in sodium chloride 0.9 % 100 mL IVPB (has no administration in time range)  sodium chloride 0.9 % bolus 1,000 mL (0 mLs Intravenous Stopped 05/14/21 1914)  iohexol (OMNIPAQUE) 300 MG/ML solution 80 mL (80 mLs Intravenous Contrast Given 05/14/21 1744)    ED Course/ Medical Decision Making/ A&P                           Medical Decision Making Amount and/or Complexity of Data Reviewed Labs: ordered. Radiology: ordered.  Risk Prescription drug management. Decision regarding hospitalization.   CRITICAL CARE Performed by: Vanetta Mulders Total critical care time: 60 minutes Critical care time was exclusive of separately billable procedures and treating other patients. Critical care was necessary to treat or prevent imminent or life-threatening deterioration. Critical care was time spent personally by me on the following activities: development of treatment plan with patient and/or surrogate as well as nursing, discussions with consultants, evaluation of patient's response to treatment, examination of patient, obtaining history from patient or surrogate, ordering and performing treatments and interventions, ordering and review of laboratory studies, ordering and review of radiographic studies, pulse oximetry and re-evaluation of patient's condition.  Discussed with Dr. Myna Hidalgo for hematology oncology.  Patient with a neutropenia and thrombocytopenia.  We will give RBC transfusion first.  He recommended covering him with meropenem for the neutropenia.  Also he is aware of probably the need for ERCP based on the ultrasound finding with gallstones not consistent with acute cholecystitis but does have some common bile duct dilatation.  Also aware that head CT raise some concerns about possible small  stroke.  Patient CT otherwise consistent with enteritis.  Patient also had C. difficile done.  Will get COVID influenza testing.  2 units of packed red blood cells now.  Patient will also need platelet transfusion.  Oncology will follow him up in the morning.  We will get the  hospitalist to admit him.  Patient may very well require ERCP may very well require MRI to rule out new acute stroke.  Continue IV fluids.  Patient's lactic acid was normal.  Blood cultures were done and are pending.  In addition pulmonary embolus or issues of concern  Patient normally on oxygen at home.  Just does not use it.  Her on 2 L his oxygen sats are in the low 90s.  No evidence of any distinct pneumonia on the chest x-ray.  I patient not tachypneic or tachycardic.  But something to keep in mind during the admission.      Final Clinical Impression(s) / ED Diagnoses Final diagnoses:  Thrombocytopenia (HCC)  Chemotherapy-induced neutropenia (HCC)  Diarrhea, unspecified type  Anemia, unspecified type    Rx / DC Orders ED Discharge Orders     None         Vanetta Mulders, MD 05/14/21 2106    Vanetta Mulders, MD 05/14/21 1610    Vanetta Mulders, MD 05/14/21 2146

## 2021-05-14 NOTE — Assessment & Plan Note (Addendum)
Hold home losartan, amlodipine, clonidine, bisoprolol as he is high risk for hypotension in setting of hypovolemia. ?

## 2021-05-14 NOTE — Assessment & Plan Note (Signed)
History of left-sided hemorrhagic pontine stroke. ?

## 2021-05-14 NOTE — Assessment & Plan Note (Signed)
S/p PCI with DES to distal RCA December 2017.  Denies chest pain.  Continue atorvastatin.  Hold aspirin with severe thrombocytopenia. ?

## 2021-05-15 ENCOUNTER — Inpatient Hospital Stay: Payer: Medicare Other

## 2021-05-15 ENCOUNTER — Inpatient Hospital Stay (HOSPITAL_COMMUNITY): Payer: Medicare Other

## 2021-05-15 DIAGNOSIS — R197 Diarrhea, unspecified: Secondary | ICD-10-CM

## 2021-05-15 DIAGNOSIS — C3490 Malignant neoplasm of unspecified part of unspecified bronchus or lung: Secondary | ICD-10-CM

## 2021-05-15 DIAGNOSIS — D61818 Other pancytopenia: Secondary | ICD-10-CM | POA: Diagnosis not present

## 2021-05-15 DIAGNOSIS — K8000 Calculus of gallbladder with acute cholecystitis without obstruction: Secondary | ICD-10-CM

## 2021-05-15 DIAGNOSIS — D696 Thrombocytopenia, unspecified: Secondary | ICD-10-CM | POA: Diagnosis not present

## 2021-05-15 DIAGNOSIS — D649 Anemia, unspecified: Secondary | ICD-10-CM | POA: Diagnosis not present

## 2021-05-15 DIAGNOSIS — D701 Agranulocytosis secondary to cancer chemotherapy: Secondary | ICD-10-CM | POA: Diagnosis not present

## 2021-05-15 DIAGNOSIS — K529 Noninfective gastroenteritis and colitis, unspecified: Secondary | ICD-10-CM | POA: Diagnosis not present

## 2021-05-15 LAB — COMPREHENSIVE METABOLIC PANEL
ALT: 27 U/L (ref 0–44)
AST: 35 U/L (ref 15–41)
Albumin: 2.8 g/dL — ABNORMAL LOW (ref 3.5–5.0)
Alkaline Phosphatase: 133 U/L — ABNORMAL HIGH (ref 38–126)
Anion gap: 9 (ref 5–15)
BUN: 30 mg/dL — ABNORMAL HIGH (ref 8–23)
CO2: 20 mmol/L — ABNORMAL LOW (ref 22–32)
Calcium: 7.7 mg/dL — ABNORMAL LOW (ref 8.9–10.3)
Chloride: 115 mmol/L — ABNORMAL HIGH (ref 98–111)
Creatinine, Ser: 1.13 mg/dL (ref 0.61–1.24)
GFR, Estimated: >60 mL/min (ref >60)
Glucose, Bld: 120 mg/dL — ABNORMAL HIGH (ref 70–99)
Potassium: 3.2 mmol/L — ABNORMAL LOW (ref 3.5–5.1)
Sodium: 144 mmol/L (ref 135–145)
Total Bilirubin: 2.1 mg/dL — ABNORMAL HIGH (ref 0.3–1.2)
Total Protein: 6.2 g/dL — ABNORMAL LOW (ref 6.5–8.1)

## 2021-05-15 LAB — URINALYSIS, ROUTINE W REFLEX MICROSCOPIC
Bilirubin Urine: NEGATIVE
Glucose, UA: NEGATIVE mg/dL
Ketones, ur: 5 mg/dL — AB
Leukocytes,Ua: NEGATIVE
Nitrite: NEGATIVE
Protein, ur: NEGATIVE mg/dL
Specific Gravity, Urine: 1.023 (ref 1.005–1.030)
pH: 5 (ref 5.0–8.0)

## 2021-05-15 LAB — PROTIME-INR
INR: 1.1 (ref 0.8–1.2)
Prothrombin Time: 14.4 seconds (ref 11.4–15.2)

## 2021-05-15 LAB — CBC WITH DIFFERENTIAL/PLATELET
Abs Immature Granulocytes: 0.01 10*3/uL (ref 0.00–0.07)
Basophils Absolute: 0 10*3/uL (ref 0.0–0.1)
Basophils Relative: 0 %
Eosinophils Absolute: 0 10*3/uL (ref 0.0–0.5)
Eosinophils Relative: 0 %
HCT: 17.7 % — ABNORMAL LOW (ref 39.0–52.0)
Hemoglobin: 5.9 g/dL — CL (ref 13.0–17.0)
Immature Granulocytes: 1 %
Lymphocytes Relative: 33 %
Lymphs Abs: 0.3 10*3/uL — ABNORMAL LOW (ref 0.7–4.0)
MCH: 28.5 pg (ref 26.0–34.0)
MCHC: 33.3 g/dL (ref 30.0–36.0)
MCV: 85.5 fL (ref 80.0–100.0)
Monocytes Absolute: 0.4 10*3/uL (ref 0.1–1.0)
Monocytes Relative: 53 %
Neutro Abs: 0.1 10*3/uL — CL (ref 1.7–7.7)
Neutrophils Relative %: 13 %
Platelets: 20 10*3/uL — CL (ref 150–400)
RBC: 2.07 MIL/uL — ABNORMAL LOW (ref 4.22–5.81)
RDW: 21.8 % — ABNORMAL HIGH (ref 11.5–15.5)
WBC: 0.8 10*3/uL — CL (ref 4.0–10.5)
nRBC: 0 % (ref 0.0–0.2)

## 2021-05-15 LAB — BILIRUBIN, DIRECT: Bilirubin, Direct: 1 mg/dL — ABNORMAL HIGH (ref 0.0–0.2)

## 2021-05-15 LAB — C DIFFICILE QUICK SCREEN W PCR REFLEX
C Diff antigen: NEGATIVE
C Diff interpretation: NOT DETECTED
C Diff toxin: NEGATIVE

## 2021-05-15 LAB — MRSA NEXT GEN BY PCR, NASAL: MRSA by PCR Next Gen: NOT DETECTED

## 2021-05-15 LAB — PREPARE RBC (CROSSMATCH)

## 2021-05-15 MED ORDER — DEXMEDETOMIDINE HCL IN NACL 200 MCG/50ML IV SOLN
0.4000 ug/kg/h | INTRAVENOUS | Status: DC
  Administered 2021-05-15: 0.4 ug/kg/h via INTRAVENOUS
  Administered 2021-05-15: 0.5 ug/kg/h via INTRAVENOUS
  Filled 2021-05-15 (×4): qty 50

## 2021-05-15 MED ORDER — ARFORMOTEROL TARTRATE 15 MCG/2ML IN NEBU
15.0000 ug | INHALATION_SOLUTION | Freq: Two times a day (BID) | RESPIRATORY_TRACT | Status: DC
  Administered 2021-05-16 – 2021-05-17 (×3): 15 ug via RESPIRATORY_TRACT
  Filled 2021-05-15 (×4): qty 2

## 2021-05-15 MED ORDER — TBO-FILGRASTIM 480 MCG/0.8ML ~~LOC~~ SOSY
480.0000 ug | PREFILLED_SYRINGE | Freq: Every day | SUBCUTANEOUS | Status: DC
  Administered 2021-05-15: 480 ug via SUBCUTANEOUS
  Filled 2021-05-15 (×2): qty 0.8

## 2021-05-15 MED ORDER — POTASSIUM CHLORIDE 10 MEQ/100ML IV SOLN
10.0000 meq | INTRAVENOUS | Status: AC
  Administered 2021-05-15 (×4): 10 meq via INTRAVENOUS
  Filled 2021-05-15 (×4): qty 100

## 2021-05-15 MED ORDER — SODIUM CHLORIDE 0.9 % IV SOLN
1.0000 g | Freq: Three times a day (TID) | INTRAVENOUS | Status: DC
  Administered 2021-05-15 – 2021-05-17 (×5): 1 g via INTRAVENOUS
  Filled 2021-05-15: qty 20
  Filled 2021-05-15: qty 1
  Filled 2021-05-15 (×5): qty 20

## 2021-05-15 MED ORDER — LORAZEPAM 2 MG/ML IJ SOLN
0.5000 mg | Freq: Four times a day (QID) | INTRAMUSCULAR | Status: DC | PRN
  Administered 2021-05-15: 0.5 mg via INTRAVENOUS
  Filled 2021-05-15: qty 1

## 2021-05-15 MED ORDER — SODIUM CHLORIDE 0.9% IV SOLUTION: Freq: Once | INTRAVENOUS | Status: DC

## 2021-05-15 MED ORDER — QUETIAPINE FUMARATE 50 MG PO TABS
25.0000 mg | ORAL_TABLET | Freq: Two times a day (BID) | ORAL | Status: DC
  Filled 2021-05-15: qty 1

## 2021-05-15 MED ORDER — CHLORHEXIDINE GLUCONATE CLOTH 2 % EX PADS
6.0000 | MEDICATED_PAD | Freq: Every day | CUTANEOUS | Status: DC
  Administered 2021-05-15 – 2021-05-17 (×2): 6 via TOPICAL

## 2021-05-15 MED ORDER — BUDESONIDE 0.5 MG/2ML IN SUSP
0.5000 mg | Freq: Two times a day (BID) | RESPIRATORY_TRACT | Status: DC
  Administered 2021-05-16 – 2021-05-17 (×3): 0.5 mg via RESPIRATORY_TRACT
  Filled 2021-05-15 (×4): qty 2

## 2021-05-15 MED ORDER — HYDROMORPHONE HCL 1 MG/ML IJ SOLN
0.5000 mg | Freq: Once | INTRAMUSCULAR | Status: AC | PRN
  Administered 2021-05-15: 0.5 mg via INTRAVENOUS
  Filled 2021-05-15: qty 1

## 2021-05-15 MED ORDER — DEXTROSE IN LACTATED RINGERS 5 % IV SOLN: INTRAVENOUS | Status: AC

## 2021-05-15 MED ORDER — HYDROMORPHONE HCL 1 MG/ML IJ SOLN
0.5000 mg | INTRAMUSCULAR | Status: DC | PRN
  Administered 2021-05-15 – 2021-05-16 (×4): 0.5 mg via INTRAVENOUS
  Filled 2021-05-15 (×6): qty 1

## 2021-05-15 MED ORDER — HALOPERIDOL LACTATE 5 MG/ML IJ SOLN
1.0000 mg | Freq: Once | INTRAMUSCULAR | Status: AC | PRN
  Administered 2021-05-15: 1 mg via INTRAVENOUS
  Filled 2021-05-15: qty 1

## 2021-05-15 MED ORDER — ORAL CARE MOUTH RINSE
15.0000 mL | Freq: Two times a day (BID) | OROMUCOSAL | Status: DC
  Administered 2021-05-16 – 2021-05-17 (×2): 15 mL via OROMUCOSAL

## 2021-05-15 MED ORDER — REVEFENACIN 175 MCG/3ML IN SOLN
175.0000 ug | Freq: Every day | RESPIRATORY_TRACT | Status: DC
  Filled 2021-05-15: qty 3

## 2021-05-15 NOTE — ED Notes (Signed)
Pt becoming increasingly confused, yelling to go home, pulling at lines. Admitting MD notified. Mittens applied.  ?

## 2021-05-15 NOTE — Progress Notes (Signed)
Initial Nutrition Assessment ? ?DOCUMENTATION CODES:  ? ?Not applicable ? ?INTERVENTION:  ? ?Boost Breeze po TID, each supplement provides 250 kcal and 9 grams of protein. ? ?When diet advanced, recommend change to Ensure Enlive/Plus supplement for more protein and calories.  ? ?NUTRITION DIAGNOSIS:  ? ?Increased nutrient needs related to cancer and cancer related treatments as evidenced by estimated needs. ? ?GOAL:  ? ?Patient will meet greater than or equal to 90% of their needs ? ?MONITOR:  ? ?Diet advancement, PO intake, Supplement acceptance ? ?REASON FOR ASSESSMENT:  ? ?Malnutrition Screening Tool ?  ? ?ASSESSMENT:  ? ?71 yo male admitted with severe pancytopenia, abnormal LFT. PMH includes recurrent metastatic small cell lung cancer on chemo, S/P radiation, HTN, smoker, CHF, COPD, CAD, DM, GERD, NSTEMI, CVA. ? ?PTA, patient was having diarrhea and abdominal discomfort. ?CT abd/pelvis showed a 1 cm gallstone with marked gallbladder distention.  ?Currently on a clear liquid diet d/t recent enteritis.  ?Pork is listed as an allergy. Patient does not eat pork d/t religious beliefs.  ?RD working remotely. Unable to reach patient by phone.  ? ?Labs and medications reviewed.  ? ?Weight history reviewed. Patient has lost at least 9% of usual weight within the past month, which is severe. Suspect he has some degree of malnutrition. Unable to obtain enough information at this time for identification of malnutrition.  ? ?NUTRITION - FOCUSED PHYSICAL EXAM: ? ?Unable to complete ? ?Diet Order:   ?Diet Order   ? ?       ?  Diet clear liquid Room service appropriate? Yes; Fluid consistency: Thin  Diet effective now       ?  ? ?  ?  ? ?  ? ? ?EDUCATION NEEDS:  ? ?Not appropriate for education at this time ? ?Skin:  Skin Assessment: Reviewed RN Assessment ? ?Last BM:  4/28 ? ?Height:  ? ?Ht Readings from Last 1 Encounters:  ?05/08/2021 6' (1.829 m)  ? ? ?Weight:  ? ?Wt Readings from Last 1 Encounters:  ?04/21/2021 74.8 kg   ? ? ? ?BMI:  Body mass index is 22.38 kg/m?. ? ?Estimated Nutritional Needs:  ? ?Kcal:  [id number] ? ?Protein:  115-130 gm ? ?Fluid:  2.3 L ? ? ? ?Lucas Mallow RD, LDN, CNSC ?Please refer to Amion for contact information.                                                       ? ?

## 2021-05-15 NOTE — Progress Notes (Addendum)
?PROGRESS NOTE ? ? ? ?Jonathon [name]  GMW:[id number] DOB: 04/14/1950 DOA: 04/17/2021 ?PCP: [name], MD  ? ? ? ?Brief Narrative:  ? 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 ?Oncology, GI, critical care, palliative care consulted ? ? ?Subjective: ? ?Very confused, agitated ?Wife at bedside ? ?Assessment & Plan: ? Principal Problem: ?  Enteritis ?Active Problems: ?  Anemia ?  Common bile duct dilatation ?  AKI (acute kidney injury) ([place]) ?  COPD (chronic obstructive pulmonary disease) ([place]) ?  Primary lung small cell carcinoma, right ([place]) ?  Abnormal head CT ?  Type 2 diabetes mellitus without complication, without long-term current use of insulin ([place]) ?  Hypertension associated with diabetes ([place]) ?  Coronary artery disease ?  CVA, old, hemiparesis ([place]) ?  Hyperlipidemia associated with type 2 diabetes mellitus ([place]) ?  Diarrhea ?  Thrombocytopenia ([place]) ?  Gallstones and inflammation of gallbladder without obstruction ? ? ? ?Assessment and Plan: ? ?Acute metabolic encephalopathy of unclear etiology ?-CT head possible subacute CVA ?-Too agitated to undergo MRI of the brain ?-Seen by critical care, started on Precedex drip ?-Check ammonia level, follow-up on blood culture, stool study, urine culture ? ? ?CT abdomen showed biliary dilatation, fecal fluids filled bowel loops, possible enteritis ?Diarrhea, per RN stool is pasty not acceptable for C. difficile testing, but acceptable GI PCR  ?distended gallbladder with cholelithiasis, diffuse intra and extrahepatic dilation , Ast/alt unremarkable, does not appear to  have pain on exam ,seen by GI no able to do intervention  even IR drain currently due  to severe cytopenias, continue meropenem ? ? ?AKI (acute kidney injury) (HCC)/acute urinary retention ?-400 cc urine drained after Foley insertion ?-Also appeared dehydrated, continue hydration, transfusion  ?-cr improved on repeat  ? ?Pancytopenia, last chemo on 4/17 ?Not appear to have external bleed ?Supportive transfusion ?G-CSF per oncology ? ?Primary lung small cell carcinoma, right ([place]), recurrent ?Follows with oncology, Dr. [name].  On active treatment with Zepzelca,last on 4/17 ?COPD (chronic obstructive pulmonary disease) ([place]) ?Continue Dulera, albuterol as needed, supplemental oxygen as needed. ?Chronic hypoxic respiratory failure on home O2 ? ? ?Hypertension associated with diabetes ([place]) ?Hold home losartan, amlodipine, clonidine, bisoprolol as he is high risk for hypotension in setting of hypovolemia. ?Type 2 diabetes mellitus without complication, without long-term current use of insulin ([place]) ?Hold home metformin.  Monitor CBGs and add SSI if needed. ?Hyperlipidemia associated with type 2 diabetes mellitus ([place]) ?Continue atorvastatin. ?CVA, old, hemiparesis ([place]) ?History of left-sided hemorrhagic pontine stroke. ?Coronary artery disease ?S/p PCI with DES to distal RCA December 2017.  Denies chest pain.  Continue atorvastatin.  Hold aspirin with severe thrombocytopenia. ? ? ?FTT; per family he stopped driving but 104-month ago, he was not able to walk independently about 3 months ago, ongoing weight loss, progressive weakness, palliative care consulted ? ?Nutritional Assessment: ?The patient?s BMI is: Body mass index is 22.38 kg/m?Marland KitchenMarland Kitchen ?Seen by dietician.  I agree with the assessment and plan as outlined below: ?Nutrition Status: ?Nutrition Problem: Increased nutrient needs ?Etiology: cancer and cancer related treatments ?Signs/Symptoms: estimated needs ?Interventions: Boost Breeze ? ?. ? ?   ? ?I  have  Reviewed nursing notes, Vitals, pain scores, I/o's, Lab results and  imaging results since pt's last encounter, details please see discussion above  ?I ordered the following labs:  ?Unresulted Labs (From admission, onward)  ? ?  Start     Ordered  ? 05/15/21 1116  Urine Culture  Once,   R       ?Question:  Indication  Answer:  Altered mental status (if no other cause identified)  ? 05/15/21 1115  ? 05/15/21 0719  CBC with Differential/Platelet  Daily,   R     ? 05/15/21 0717  ? 05/15/21 0719  Comprehensive metabolic panel  Daily,   R     ? 05/15/21 0717  ? 04/27/2021 2220  Gastrointestinal Panel by PCR , Stool  (Gastrointestinal Panel by PCR, Stool                                                                                                                                                     **Does Not include CLOSTRIDIUM DIFFICILE testing. **If CDIFF testing is needed, place order from the "C Difficile Testing" order set.**)  Once,   R       ? 04/24/2021 2219  ? 05/16/2021 1803  C Difficile Quick Screen w PCR reflex  (C Difficile quick screen w PCR reflex panel )  Once, for 24 hours,   URGENT       ?References:    CDiff Information Tool  ? 04/20/2021 1803  ? 04/20/2021 1633  Lipase, blood  Once,   STAT       ? 05/02/2021 1633  ? 05/05/2021 1633  Culture, blood (Routine X 2) w Reflex to ID Panel  BLOOD CULTURE X 2,   R (with STAT occurrences)     ? 05/04/2021 1633  ? ?  ?  ? ?  ? ? ? ?DVT prophylaxis: SCDs Start: 04/27/2021 2221 ? ? ?Code Status:   Code Status: DNR ? ?Family Communication: Wife at bedside, son over the phone ?Disposition:  ? ?Dispo: The patient is from: Home ?             Anticipated d/c is to: TBD ?             Anticipated d/c date is: TBD ? ?Antimicrobials:   ? ?Anti-infectives (From admission, onward)  ? ? Start     Dose/Rate Route Frequency Ordered Stop  ? 05/15/21 2100  meropenem (MERREM) 1 g in sodium chloride 0.9 % 100 mL IVPB       ? 1 g ?200 mL/hr over 30 Minutes Intravenous Every 8 hours 05/15/21 1534     ? 05/15/21 1000  meropenem (MERREM) 1 g in sodium chloride 0.9 % 100 mL IVPB  Status:  Discontinued       ? 1 g ?200 mL/hr over 30  Minutes Intravenous Every 12 hours 04/17/2021 2224 05/15/21 1534  ? 04/28/2021 2130  meropenem (MERREM) 1 g in sodium chloride 0.9 % 100 mL IVPB       ? 1 g ?200 mL/hr over 30 Minutes Intravenous  Once 04/21/2021 2126 04/18/2021 2321  ? 05/12/2021 2115  meropenem (MERREM) 500 mg in sodium chloride 0.9 % 100 mL IVPB  Status:  Discontinued       ? 500 mg ?200 mL/hr over 30 Minutes Intravenous  Once 05/11/2021 2103 04/24/2021 2126  ? ?  ? ? ? ? ? ?Objective: ?Vitals:  ? 05/15/21 1118 05/15/21 1126 05/15/21 1300 05/15/21 1500  ?BP: 123/80 (!) 143/75 (!) 134/53 106/66  ?Pulse: 96 96 95 100  ?Resp: 18 (!) 25 (!) 30 16  ?Temp: 97.6 ?F (36.4 ?C) (!) 97.5 ?F (36.4 ?C)    ?TempSrc: Axillary Axillary    ?SpO2: 99% 96% 91% 92%  ?Weight:      ?Height:      ? ? ?Intake/Output Summary (Last 24 hours) at 05/15/2021 1546 ?Last data filed at 05/15/2021 1200 ?Gross per 24 hour  ?Intake 1321.97 ml  ?Output 300 ml  ?Net 1021.97 ml  ? ?Filed Weights  ? 05/01/2021 1507  ?Weight: 74.8 kg  ? ? ?Examination: ? ?General exam: alert, very confused and agitated, appears to have hallucinations ?Respiratory system: Intermittent tachypneic likely due to agitation, no accessory muscle use, on 2 L oxygen. ?Cardiovascular system: Sinus tachycardia ?Gastrointestinal system: Abdomen is nondistended, soft and nontender.  Normal bowel sounds heard. ?Central nervous system: Alert and confused, does not follow command. ?Extremities:  no edema ?Skin: scattered ecchymosis ?Psychiatry: Agitated ? ? ? ?Data Reviewed: I have personally reviewed  labs and visualized  imaging studies since the last encounter and formulate the plan  ? ? ? ? ? ? ?Scheduled Meds: ? sodium chloride   Intravenous Once  ? acetaminophen  650 mg Oral Once  ? Chlorhexidine Gluconate Cloth  6 each Topical Daily  ? diphenhydrAMINE  25 mg Oral Once  ? DULoxetine  30 mg Oral QHS   ? mouth rinse  15 mL Mouth Rinse BID  ? mometasone-formoterol  2 puff Inhalation BID  ? nortriptyline  25 mg Oral QHS  ? QUEtiapine  25 mg Oral BID  ? sodium chloride flush  3 mL Intravenous Q12H  ? Tbo-Fil

## 2021-05-15 NOTE — Consult Note (Signed)
Referral MD ? ?Reason for Referral: Pancytopenia secondary to chemotherapy; small cell lung cancer-extensive stage ? ?Chief Complaint  ?Patient presents with  ? Weakness  ?: Patient really cannot give any history. ? ?HPI: Jonathon [name] is a 71 year old [place] male.  He is followed by Dr. Earlie Server.  He has small cell lung cancer.  He has extensive stage disease.  He is on lurbinectedin.  His last dose was on 05/03/2021.  He has had 3 cycles. ? ?On 05/12/2021, he was found to be pancytopenic.  His white count 0.3.  Hemoglobin 7.3.  His platelet count was 16,000.  His BUN was 46 and creatinine 2.43.  Blood glucose 121. ? ?Over the past several days.  He has declined and is overall status.  He has had diarrhea.  There is been poor oral intake.  He has been having some abdominal discomfort. ? ?He recently was on doxycycline for a possible dental infection. ? ?He was taken to the ER at [facility].  On 04/17/2021, his CBC showed a white count 0.4.  Hemoglobin 6.1.  Platelet count 90,000.  His sodium 137.  Potassium 3.8.  BUN 44 creatinine 1.73.  Calcium 7.7 with an albumin of 2.8. ? ?He had a CT of the head.  This showed some changes along the anteromedial aspect of the cerebellum on the left.  This may represent subacute infarct.  MRI was recommended. ? ?He had a CT of the abdomen pelvis.  He had a markedly distended gallbladder with intra hepatic biliary ductal dilatation.  There may have been some enteritis noted in the bowel. ? ?He had a ultrasound of the abdomen.  He had cholelithiasis without findings of acute cholecystitis.  There was dilated common duct with intrahepatic ductal dilatation. ? ?He is on IV meropenem.  Cultures have been taken.  They are pending. ? ?He has had 1 unit of blood so far.  Platelets are ordered. ? ?He is tested negative for COVID and Influenza. ? ? ?Past Medical History:  ?Diagnosis Date  ? Cancer [facility])   ? CHF (congestive heart failure) ([place])   ? Chronic cough   ? COPD, severe  ([place])   ? PT DENIES REGULAR DAILY INHALER ANY PRN  ? Coronary artery disease   ? CVA (cerebral vascular accident) ([place]) 02/22/2011  ? LEFT BRAIN STEM PONTINE HEMORRHAGE--  RIGHT SIDED WEAKNESS  ? Diabetes mellitus without complication ([place])   ? type 2  ? Dyspnea   ? GERD (gastroesophageal reflux disease)   ? Harsh voice quality   ? PT STATES NORMAL FOR HIM  ? High cholesterol   ? History of CHF (congestive heart failure) MONITORED BY DR Coletta Memos  ? DIASTOLIC  ? History of radiation therapy 03/13/19-03/20/19  ? SBRT Right Lung; Dr. Gery Pray  ? History of radiation therapy 02/17/20-02/28/20  ? Whole brain radiation; Dr. Gery Pray  ? History of radiation therapy 05/25/2020  ? 05/18/2020-05/25/2020  SBRT Right Lung; Dr. Gery Pray  ? Hydrocele, left   ? Hypertension   ? NSTEMI (non-ST elevated myocardial infarction) ([place]) 12/2015  ? Pre-operative clearance   ? GIVEN BY DR Coletta Memos  ? Short of breath on exertion   ? Smoker   ? Weakness of right side of body   ? S/P CVA   FEB 2013  ?: ? ? ?Past Surgical History:  ?Procedure Laterality Date  ? ABDOMINAL ANGIOGRAM  09/04/2012  ? Procedure: ABDOMINAL ANGIOGRAM;  Surgeon: Laverda Page, MD;  Location: [facility] CATH LAB;  Service: Cardiovascular;;  ? CARDIAC CATHETERIZATION  05-02-2011  DR Johnsie Cancel  ? MILD NONOBSTRUCTIVE CAD/ LAD, OM , AND D1/ EF 60^  ? CARDIAC CATHETERIZATION  AUG 2000  ? NON-OBSTRUTIVE CAD/ EF 68%/ 25% PROXIMAL LAD/ 20% MID CIRCUMFLEX  ? CARDIAC CATHETERIZATION [name] 12/23/2015  ? Procedure: Left Heart Cath and Coronary Angiography;  Surgeon: Adrian Prows, MD;  Location: [facility] CV LAB;  Service: Cardiovascular;  Laterality: [name];  ? CARDIAC CATHETERIZATION [name] 12/24/2015  ? Procedure: Coronary Stent Intervention;  Surgeon: Adrian Prows, MD;  Location: [place] CV LAB;  Service: Cardiovascular;  Laterality: [name];  ? CORONARY ANGIOPLASTY    ? CORONARY STENT PLACEMENT  12/24/2015  ?  PTCA and stenting of the distal RCA   ? HYDROCELE EXCISION Left 04/24/2012  ?  Procedure: HYDROCELECTOMY ADULT;  Surgeon: Fredricka Bonine, MD;  Location: [facility];  Service: Urology;  Laterality: Left;  90 mins req for this case ? ? ?  ? [place]  ? LEFT AND RIGHT HEART CATHETERIZATION WITH CORONARY ANGIOGRAM [name] 09/04/2012  ? Procedure: LEFT AND RIGHT HEART CATHETERIZATION WITH CORONARY ANGIOGRAM;  Surgeon: Laverda Page, MD;  Location: [facility] CATH LAB;  Service: Cardiovascular;  Laterality: [name];  ? LEFT HEART CATHETERIZATION WITH CORONARY ANGIOGRAM [name] 05/02/2011  ? Procedure: LEFT HEART CATHETERIZATION WITH CORONARY ANGIOGRAM;  Surgeon: Josue Hector, MD;  Location[facility] CATH LAB;  Service: Cardiovascular;  Laterality: [name];  ? THORACOTOMY/LOBECTOMY Right 03-06-2008  ? AND STAPLING OF BLEBS FOR SPONTANOUS PNEUMOTHORAX  ? TRANSTHORACIC ECHOCARDIOGRAM  02-17-2011  ? MODERATE LVH/ LVSF NORMAL/ EF 23-53%/ GRADE I DIASTOLIC DYSFUNCTION  ?: ? ? ?Current Facility-Administered Medications:  ?  0.9 %  sodium chloride infusion (Manually program via Guardrails IV Fluids), , Intravenous, Once, Laurrie Toppin, Rudell Cobb, MD ?  0.9 %  sodium chloride infusion, , Intravenous, Continuous, Lenore Cordia, MD, Stopped at 05/15/21 0449 ?  0.9 %  sodium chloride infusion, 10 mL/hr, Intravenous, Once, Fredia Sorrow, MD ?  acetaminophen (TYLENOL) tablet 650 mg, 650 mg, Oral, Q6H PRN **OR** acetaminophen (TYLENOL) suppository 650 mg, 650 mg, Rectal, Q6H PRN, Lenore Cordia, MD ?  acetaminophen (TYLENOL) tablet 650 mg, 650 mg, Oral, Once, Murlin Schrieber, Rudell Cobb, MD ?  albuterol (PROVENTIL) (2.5 MG/3ML) 0.083% nebulizer solution 2.5 mg, 2.5 mg, Nebulization, Q4H PRN, Lenore Cordia, MD ?  atorvastatin (LIPITOR) tablet 20 mg, 20 mg, Oral, Daily, Patel, Vishal R, MD ?  diphenhydrAMINE (BENADRYL) capsule 25 mg, 25 mg, Oral, Once, Chevon Laufer, Rudell Cobb, MD ?  DULoxetine (CYMBALTA) DR capsule 30 mg, 30 mg, Oral, QHS, Patel, Vishal R, MD ?  meropenem (MERREM) 1 g in sodium chloride 0.9 % 100  mL IVPB, 1 g, Intravenous, Q12H, Patel, Vishal R, MD ?  mometasone-formoterol (DULERA) 200-5 MCG/ACT inhaler 2 puff, 2 puff, Inhalation, BID, Lenore Cordia, MD ?  nortriptyline (PAMELOR) capsule 25 mg, 25 mg, Oral, QHS, Patel, Vishal R, MD ?  ondansetron (ZOFRAN) tablet 4 mg, 4 mg, Oral, Q6H PRN **OR** ondansetron (ZOFRAN) injection 4 mg, 4 mg, Intravenous, Q6H PRN, Posey Pronto, Vishal R, MD ?  sodium chloride flush (NS) 0.9 % injection 3 mL, 3 mL, Intravenous, Q12H, Lenore Cordia, MD ? ?Current Outpatient Medications:  ?  albuterol (PROVENTIL) (2.5 MG/3ML) 0.083% nebulizer solution, Take 2.5 mg by nebulization every 4 (four) hours as needed for wheezing or shortness of breath., Disp: , Rfl:  ?  Albuterol Sulfate (PROAIR RESPICLICK) 614 (90 BASE) MCG/ACT AEPB,  Inhale 2 puffs into the lungs 4 (four) times daily as needed. (Patient taking differently: Inhale 2 puffs into the lungs 4 (four) times daily as needed (wheezing).), Disp: 1 each, Rfl: 5 ?  amLODipine (NORVASC) 10 MG tablet, TAKE 1 TABLET BY MOUTH ONCE A DAY (Patient taking differently: Take 10 mg by mouth every evening.), Disp: 90 tablet, Rfl: 3 ?  atorvastatin (LIPITOR) 20 MG tablet, Take one tablet by mouth daily. (Patient taking differently: Take 20 mg by mouth daily.), Disp: 30 tablet, Rfl: 5 ?  bisoprolol (ZEBETA) 5 MG tablet, Take one tablet (5 mg dose) by mouth daily. (Patient taking differently: Take 5 mg by mouth every evening.), Disp: 90 tablet, Rfl: 3 ?  cloNIDine (CATAPRES) 0.3 MG tablet, TAKE 1 TABLET BY MOUTH TWICE DAILY, Disp: 180 tablet, Rfl: 3 ?  cyclobenzaprine (FLEXERIL) 5 MG tablet, TAKE 1 TABLET BY MOUTH AT BEDTIME AS NEEDED FOR MUSCLE SPASMS (Patient taking differently: Take 5 mg by mouth at bedtime.), Disp: 30 tablet, Rfl: 3 ?  DULoxetine (CYMBALTA) 30 MG capsule, Take one capsule (30 mg dose) by mouth daily. (Patient taking differently: Take 30 mg by mouth at bedtime.), Disp: 90 capsule, Rfl: 3 ?  gabapentin (NEURONTIN) 400 MG capsule,  TAKE 2 CAPSULES BY MOUTH THREE TIMES A DAY (Patient taking differently: Take 1,200 mg by mouth 2 (two) times daily.), Disp: 180 capsule, Rfl: 7 ?  losartan (COZAAR) 100 MG tablet, TAKE 1 TABLET BY MOUTH DAILY.

## 2021-05-15 NOTE — Consult Note (Signed)
? ?NAME:  Jonathon [name], MRN:  [id number], DOB:  1950/09/03, LOS: 1 ?ADMISSION DATE:  05/02/2021, CONSULTATION DATE:  05/15/21 ?REFERRING MD:  Jonathon Reasons, MD CHIEF COMPLAINT:  AMS/Agitation ? ?History of Present Illness:  ?Jonathon [name] is a 71 year old male with extensive stage small cell lung cancer on lurbinectedin last treatment on 05/03/21 who was noted to be pancytopenic on 05/12/21 and had a decline in his overall functional status and develop diarrhea/poor oral intake. He was admitted on 05/11/2021 where CT head showed possible subacute infarct and MRI was recommended. CT abdomen/pelvis showed markedly distended gallbladder with intra hepatic biliary ductal dilatation along with possible enteritis of the bowel. US of the abdomen showed cholelithiasis without findings of acute cholecystitis and intrahepatic ductal dilatation. Blood and urine cultures remain negative to date. He has been started on meropenem. He has been transfused 1 unit PRBCs and given Neupogen for the neutropenia.  ? ?GI was consulted for the biliary dilatation and do not recommend any procedures at this time due to his blood counts. They have recommended checking MRCP. ? ?PCCM has been consulted for intermittent agitation that is hindering him from getting MRI brain and MRCP with request for precedex. ? ?Pertinent  Medical History  ? ?CHF ?COPD ?CVA ?DMII ?Small Cell Lung Cancer ?GERD ?Hypertension ? ?Significant Hospital Events: ?Including procedures, antibiotic start and stop dates in addition to other pertinent events   ?4/28 admitted ?4/29 PCCM consulted for precedex ? ?Interim History / Subjective:  ?As above ? ?Objective   ?Blood pressure 106/66, pulse 100, temperature (!) 97.5 ?F (36.4 ?C), temperature source Axillary, resp. rate 16, height 6' (1.829 m), weight 74.8 kg, SpO2 92 %. ?   ?   ? ?Intake/Output Summary (Last 24 hours) at 05/15/2021 1600 ?Last data filed at 05/15/2021 1445 ?Gross per 24 hour  ?Intake 1422.08 ml  ?Output 300  ml  ?Net 1122.08 ml  ? ?Filed Weights  ? 04/22/2021 1507  ?Weight: 74.8 kg  ? ? ?Examination: ?General: elderly male, asleep in bed, no distress ?HENT: [place]/AT, moist mucous membranes, sclera anicteric ?Lungs: diminished breath sounds, mild intermittent wheezing ?Cardiovascular: rrr, no murmurs ?Abdomen: soft, non-distended, bowel sounds present ?Extremities: warm, no edema ?Neuro: sedated on precedex ?GU: n/a ? ?Resolved Hospital Problem list   ? ? ?Assessment & Plan:  ?Altered Mental Status ?Pancytopenia ?Enteritis ?COPD ?Small Cell Lung Cancer ? ?Plan: ?- start precedex for sedation in order to obtain MRI brain and MRCP ?- Give extra dose of dilaudid 0.5mg  if needed prior to traveling for scans ?- For COPD, I started budesonide, brovana and yupelri nebulizer treatments.  ?- rest of management per primary team ? ?Best Practice (right click and "Reselect all SmartList Selections" daily)  ? ?Per primary team ? ?Labs   ?CBC: ?Recent Labs  ?Lab 05/12/21 ?1413 05/09/2021 ?1615 05/15/21 ?1427  ?WBC 0.3* 0.4* 0.8*  ?NEUTROABS 0.1* 0.1* 0.1*  ?HGB 7.3* 6.1* 5.9*  ?HCT 22.3* 18.4* 17.7*  ?MCV 83.2 85.6 85.5  ?PLT 16* 9* 20*  ? ? ?Basic Metabolic Panel: ?Recent Labs  ?Lab 05/12/21 ?1413 04/28/2021 ?1615 05/15/21 ?1427  ?NA 133* 137 144  ?K 3.3* 3.8 3.2*  ?CL 102 109 115*  ?CO2 18* 20* 20*  ?GLUCOSE 121* 133* 120*  ?BUN 46* 44* 30*  ?CREATININE 2.43* 1.73* 1.13  ?CALCIUM 7.9* 7.7* 7.7*  ? ?GFR: ?Estimated Creatinine Clearance: 63.4 mL/min (by C-G formula based on SCr of 1.13 mg/dL). ?Recent Labs  ?Lab 05/12/21 ?1413 05/06/2021 ?1615 05/15/21 ?  1427  ?WBC 0.3* 0.4* 0.8*  ?LATICACIDVEN  --  0.8  --   ? ? ?Liver Function Tests: ?Recent Labs  ?Lab 05/12/21 ?1413 05/16/2021 ?1615 05/15/21 ?1427  ?AST 35 37 35  ?ALT 27 27 27   ?ALKPHOS 78 147* 133*  ?BILITOT 0.8 2.6* 2.1*  ?PROT 6.9 6.5 6.2*  ?ALBUMIN 3.1* 2.8* 2.8*  ? ?No results for input(s): LIPASE, AMYLASE in the last 168 hours. ?No results for input(s): AMMONIA in the last 168  hours. ? ?ABG ?   ?Component Value Date/Time  ? PHART 7.383 09/04/2012 1018  ? PCO2ART 46.2 (H) 09/04/2012 1018  ? PO2ART 67.0 (L) 09/04/2012 1018  ? HCO3 29.0 (H) 09/04/2012 1025  ? TCO2 30 09/04/2012 1025  ? O2SAT 70.0 09/04/2012 1025  ?  ? ?Coagulation Profile: ?Recent Labs  ?Lab 05/15/21 ?1427  ?INR 1.1  ? ? ?Cardiac Enzymes: ?No results for input(s): CKTOTAL, CKMB, CKMBINDEX, TROPONINI in the last 168 hours. ? ?HbA1C: ?Hgb A1c MFr Bld  ?Date/Time Value Ref Range Status  ?03/20/2021 07:11 PM 6.6 (H) 4.8 - 5.6 % Final  ?  Comment:  ?  (NOTE) ?Pre diabetes:          5.7%-6.4% ? ?Diabetes:              >6.4% ? ?Glycemic control for   <7.0% ?adults with diabetes ?  ?09/28/2013 07:20 AM 6.7 (H) <5.7 % Final  ?  Comment:  ?  (NOTE) ?                                                                      ?According to the ADA Clinical Practice Recommendations for 2011, when ?HbA1c is used as a screening test: ? >=6.5%   Diagnostic of Diabetes Mellitus ?          (if abnormal result is confirmed) ?5.7-6.4%   Increased risk of developing Diabetes Mellitus ?References:Diagnosis and Classification of Diabetes Mellitus,Diabetes ?WVPX,1062,69(SWNIO 1):S62-S69 and Standards of Medical Care in         ?Diabetes - 2011,Diabetes Care,2011,34 (Suppl 1):S11-S61.  ? ? ?CBG: ?No results for input(s): GLUCAP in the last 168 hours. ? ?Review of Systems:   ? ?Unable to obtain ROS due to mental status ? ?Past Medical History:  ?He,  has a past medical history of Cancer ([place]), CHF (congestive heart failure) ([place]), Chronic cough, COPD, severe ([place]), Coronary artery disease, CVA (cerebral vascular accident) ([place]) (02/22/2011), Diabetes mellitus without complication ([place]), Dyspnea, GERD (gastroesophageal reflux disease), Harsh voice quality, High cholesterol, History of CHF (congestive heart failure) (MONITORED BY DR Coletta Memos), History of radiation therapy (03/13/19-03/20/19), History of radiation therapy (02/17/20-02/28/20), History of  radiation therapy (05/25/2020), Hydrocele, left, Hypertension, NSTEMI (non-ST elevated myocardial infarction) ([place]) (12/2015), Pre-operative clearance, Short of breath on exertion, Smoker, and Weakness of right side of body.  ? ?Surgical History:  ? ?Past Surgical History:  ?Procedure Laterality Date  ? ABDOMINAL ANGIOGRAM  09/04/2012  ? Procedure: ABDOMINAL ANGIOGRAM;  Surgeon: Laverda Page, MD;  Location: [facility] CATH LAB;  Service: Cardiovascular;;  ? CARDIAC CATHETERIZATION  05-02-2011  DR Johnsie Cancel  ? MILD NONOBSTRUCTIVE CAD/ LAD, OM , AND D1/ EF 60^  ? CARDIAC CATHETERIZATION  AUG 2000  ? NON-OBSTRUTIVE CAD/ EF 68%/  25% PROXIMAL LAD/ 20% MID CIRCUMFLEX  ? CARDIAC CATHETERIZATION [name] 12/23/2015  ? Procedure: Left Heart Cath and Coronary Angiography;  Surgeon: Adrian Prows, MD;  Location: [place] CV LAB;  Service: Cardiovascular;  Laterality: [name];  ? CARDIAC CATHETERIZATION [name] 12/24/2015  ? Procedure: Coronary Stent Intervention;  Surgeon: Adrian Prows, MD;  Location: [place] CV LAB;  Service: Cardiovascular;  Laterality: [name];  ? CORONARY ANGIOPLASTY    ? CORONARY STENT PLACEMENT  12/24/2015  ?  PTCA and stenting of the distal RCA   ? HYDROCELE EXCISION Left 04/24/2012  ? Procedure: HYDROCELECTOMY ADULT;  Surgeon: Fredricka Bonine, MD;  Location[facility];  Service: Urology;  Laterality: Left;  90 mins req for this case ? ? ?  ? [place]  ? LEFT AND RIGHT HEART CATHETERIZATION WITH CORONARY ANGIOGRAM [name] 09/04/2012  ? Procedure: LEFT AND RIGHT HEART CATHETERIZATION WITH CORONARY ANGIOGRAM;  Surgeon: Laverda Page, MD;  Location: [facility] CATH LAB;  Service: Cardiovascular;  Laterality: [name];  ? LEFT HEART CATHETERIZATION WITH CORONARY ANGIOGRAM [name] 05/02/2011  ? Procedure: LEFT HEART CATHETERIZATION WITH CORONARY ANGIOGRAM;  Surgeon: Josue Hector, MD;  Location: [facility] CATH LAB;  Service: Cardiovascular;  Laterality: [name];  ? THORACOTOMY/LOBECTOMY Right 03-06-2008  ? AND STAPLING  OF BLEBS FOR SPONTANOUS PNEUMOTHORAX  ? TRANSTHORACIC ECHOCARDIOGRAM  02-17-2011  ? MODERATE LVH/ LVSF NORMAL/ EF 00-34%/ GRADE I DIASTOLIC DYSFUNCTION  ?  ? ?Social History:  ? reports that he has been smoking cigarette

## 2021-05-15 NOTE — Consult Note (Signed)
? ? ? Consultation ? ?Referring Provider:     [facility], Dr. Florencia Reasons ?Primary Care Physician:  [name], MD ?Primary Gastroenterologist:        Althia Forts, previously Dr.Kaplan ?Reason for Consultation:     Pancytopenia, abnormal LFT ?  ? ? ?      ? HPI:   ?Jonathon [name] is a 71 y.o. male with recurrent metastatic small cell lung cancer in 2022 on chemotherapy admitted with severe pancytopenia. ?He was initially diagnosed with small cell lung cancer in 2021, s/p XRT and chemo ? ?He presented to ER yesterday diarrhea, abdominal discomfort, and LFT abnormality ? ?He is no longer having diarrhea, last bowel movement was yesterday.  None this morning ? ?He is restless, nonpurposeful movements, constantly moving in bed.  Does not complain of any specific pain ?He is conversational but is incoherent at times ? ?He was noted to have 1 cm gallstone, diffuse intra and extrahepatic biliary dilation along with marked distention of gallbladder on CT abdomen and pelvis ? ?He is severely neutropenic less than 0.1, leukopenic 0.4, platelet count 9000 ? ? ?  Latest Ref Rng & Units 05/07/2021  ?  4:15 PM 05/12/2021  ?  2:13 PM 05/03/2021  ?  9:25 AM  ?CBC  ?WBC 4.0 - 10.5 K/uL 0.4   0.3   3.7    ?Hemoglobin 13.0 - 17.0 g/dL 6.1   7.3   8.5    ?Hematocrit 39.0 - 52.0 % 18.4   22.3   26.1    ?Platelets 150 - 400 K/uL 9   16   178    ? ? ? ?  Latest Ref Rng & Units 04/24/2021  ?  4:15 PM 05/12/2021  ?  2:13 PM 05/03/2021  ?  9:25 AM  ?Hepatic Function  ?Total Protein 6.5 - 8.1 g/dL 6.5   6.9   6.8    ?Albumin 3.5 - 5.0 g/dL 2.8   3.1   3.3    ?AST 15 - 41 U/L 37   35   12    ?ALT 0 - 44 U/L 27   27   10     ?Alk Phosphatase 38 - 126 U/L 147   78   76    ?Total Bilirubin 0.3 - 1.2 mg/dL 2.6   0.8   0.6    ? ? ? ? ?Past Medical History:  ?Diagnosis Date  ? Cancer [facility])   ? CHF (congestive heart failure) ([place])   ? Chronic cough   ? COPD, severe ([place])   ? PT DENIES REGULAR DAILY INHALER ANY PRN  ? Coronary artery disease   ? CVA (cerebral  vascular accident) ([place]) 02/22/2011  ? LEFT BRAIN STEM PONTINE HEMORRHAGE--  RIGHT SIDED WEAKNESS  ? Diabetes mellitus without complication ([place])   ? type 2  ? Dyspnea   ? GERD (gastroesophageal reflux disease)   ? Harsh voice quality   ? PT STATES NORMAL FOR HIM  ? High cholesterol   ? History of CHF (congestive heart failure) MONITORED BY DR Coletta Memos  ? DIASTOLIC  ? History of radiation therapy 03/13/19-03/20/19  ? SBRT Right Lung; Dr. Gery Pray  ? History of radiation therapy 02/17/20-02/28/20  ? Whole brain radiation; Dr. Gery Pray  ? History of radiation therapy 05/25/2020  ? 05/18/2020-05/25/2020  SBRT Right Lung; Dr. Gery Pray  ? Hydrocele, left   ? Hypertension   ? NSTEMI (non-ST elevated myocardial infarction) ([place]) 12/2015  ? Pre-operative clearance   ?  GIVEN BY DR Coletta Memos  ? Short of breath on exertion   ? Smoker   ? Weakness of right side of body   ? S/P CVA   FEB 2013  ? ? ?Past Surgical History:  ?Procedure Laterality Date  ? ABDOMINAL ANGIOGRAM  09/04/2012  ? Procedure: ABDOMINAL ANGIOGRAM;  Surgeon: Laverda Page, MD;  Location: [facility] CATH LAB;  Service: Cardiovascular;;  ? CARDIAC CATHETERIZATION  05-02-2011  DR Johnsie Cancel  ? MILD NONOBSTRUCTIVE CAD/ LAD, OM , AND D1/ EF 60^  ? CARDIAC CATHETERIZATION  AUG 2000  ? NON-OBSTRUTIVE CAD/ EF 68%/ 25% PROXIMAL LAD/ 20% MID CIRCUMFLEX  ? CARDIAC CATHETERIZATION [name] 12/23/2015  ? Procedure: Left Heart Cath and Coronary Angiography;  Surgeon: Adrian Prows, MD;  Location: [place] CV LAB;  Service: Cardiovascular;  Laterality: [name];  ? CARDIAC CATHETERIZATION [name] 12/24/2015  ? Procedure: Coronary Stent Intervention;  Surgeon: Adrian Prows, MD;  Location: [place] CV LAB;  Service: Cardiovascular;  Laterality: [name];  ? CORONARY ANGIOPLASTY    ? CORONARY STENT PLACEMENT  12/24/2015  ?  PTCA and stenting of the distal RCA   ? HYDROCELE EXCISION Left 04/24/2012  ? Procedure: HYDROCELECTOMY ADULT;  Surgeon: Fredricka Bonine, MD;  Location: [facility];  Service: Urology;  Laterality: Left;  90 mins req for this case ? ? ?  ? [place]  ? LEFT AND RIGHT HEART CATHETERIZATION WITH CORONARY ANGIOGRAM [name] 09/04/2012  ? Procedure: LEFT AND RIGHT HEART CATHETERIZATION WITH CORONARY ANGIOGRAM;  Surgeon: Laverda Page, MD;  Location: [facility] CATH LAB;  Service: Cardiovascular;  Laterality: [name];  ? LEFT HEART CATHETERIZATION WITH CORONARY ANGIOGRAM [name] 05/02/2011  ? Procedure: LEFT HEART CATHETERIZATION WITH CORONARY ANGIOGRAM;  Surgeon: Josue Hector, MD;  Location: [facility] CATH LAB;  Service: Cardiovascular;  Laterality: [name];  ? THORACOTOMY/LOBECTOMY Right 03-06-2008  ? AND STAPLING OF BLEBS FOR SPONTANOUS PNEUMOTHORAX  ? TRANSTHORACIC ECHOCARDIOGRAM  02-17-2011  ? MODERATE LVH/ LVSF NORMAL/ EF 97-02%/ GRADE I DIASTOLIC DYSFUNCTION  ? ? ?Family History  ?Problem Relation Age of Onset  ? Hypertension Mother   ? Stroke Mother 62  ? Lung disease Father 27  ?     died of "colapsed lung" per patient  ? Asthma Father   ? Diabetes Sister   ? Colon cancer Neg Hx   ? Stomach cancer Neg Hx   ?  ? ?Social History  ? ?Tobacco Use  ? Smoking status: Some Days  ?  Packs/day: 0.25  ?  Years: 53.00  ?  Pack years: 13.25  ?  Types: Cigarettes  ?  Last attempt to quit: 2019  ?  Years since quitting: 4.3  ? Smokeless tobacco: Never  ? Tobacco comments:  ?  10/17/2020 maybe 1 cigarette with a cup of coffee  ?Vaping Use  ? Vaping Use: Never used  ?Substance Use Topics  ? Alcohol use: Not Currently  ?  Alcohol/week: 0.0 standard drinks  ?  Comment: 02/10/2016 "nothing in the last 10 years"  ? Drug use: No  ? ? ?Prior to Admission medications   ?Medication Sig Start Date End Date Taking? Authorizing Provider  ?albuterol (PROVENTIL) (2.5 MG/3ML) 0.083% nebulizer solution Take 2.5 mg by nebulization every 4 (four) hours as needed for wheezing or shortness of breath.   Yes [provider]  ?Albuterol Sulfate (PROAIR RESPICLICK) 637 (90 BASE) MCG/ACT AEPB Inhale 2 puffs  into the lungs 4 (four) times daily as needed. ?Patient taking  differently: Inhale 2 puffs into the lungs 4 (four) times daily as needed (wheezing). 02/24/14  Yes Parrett, Tammy S, NP  ?amLODipine (NORVASC) 10 MG tablet TAKE 1 TABLET BY MOUTH ONCE A DAY ?Patient taking differently: Take 10 mg by mouth every evening. 02/15/21  Yes   ?atorvastatin (LIPITOR) 20 MG tablet Take one tablet by mouth daily. ?Patient taking differently: Take 20 mg by mouth daily. 02/23/21  Yes   ?bisoprolol (ZEBETA) 5 MG tablet Take one tablet (5 mg dose) by mouth daily. ?Patient taking differently: Take 5 mg by mouth every evening. 11/12/20  Yes   ?cloNIDine (CATAPRES) 0.3 MG tablet TAKE 1 TABLET BY MOUTH TWICE DAILY 05/19/20  Yes   ?cyclobenzaprine (FLEXERIL) 5 MG tablet TAKE 1 TABLET BY MOUTH AT BEDTIME AS NEEDED FOR MUSCLE SPASMS ?Patient taking differently: Take 5 mg by mouth at bedtime. 04/27/21  Yes   ?DULoxetine (CYMBALTA) 30 MG capsule Take one capsule (30 mg dose) by mouth daily. ?Patient taking differently: Take 30 mg by mouth at bedtime. 11/12/20  Yes   ?gabapentin (NEURONTIN) 400 MG capsule TAKE 2 CAPSULES BY MOUTH THREE TIMES A DAY ?Patient taking differently: Take 1,200 mg by mouth 2 (two) times daily. 03/30/21  Yes   ?losartan (COZAAR) 100 MG tablet TAKE 1 TABLET BY MOUTH DAILY. ?Patient taking differently: Take 100 mg by mouth daily. 01/12/21 01/12/22 Yes Adrian Prows, MD  ?metFORMIN (GLUCOPHAGE) 500 MG tablet TAKE 1 TABLET BY MOUTH ONCE A DAY WITH BREAKFAST ?Patient taking differently: Take 500 mg by mouth daily with breakfast. 05/19/20  Yes   ?nitroGLYCERIN (NITROSTAT) 0.4 MG SL tablet PLACE 1 TABLET UNDER THE TONGUE EVERY 5  MINUTES AS NEEDED AS DIRECTED. ?Patient taking differently: 0.4 mg every 5 (five) minutes as needed for chest pain. 11/16/20 11/16/21 Yes Adrian Prows, MD  ?nortriptyline (PAMELOR) 25 MG capsule TAKE 1 CAPSULE BY MOUTH EVERY NIGHT AT BEDTIME 03/16/21  Yes   ?ondansetron (ZOFRAN) 8 MG tablet Take 0.5 tablets (4 mg  total) by mouth every 6 (six) hours. ?Patient taking differently: Take 4 mg by mouth every 4 (four) hours as needed for nausea or vomiting. 04/23/21  Yes Azucena Cecil, PA-C  ?pantoprazole (PROTONIX)

## 2021-05-16 ENCOUNTER — Encounter (HOSPITAL_COMMUNITY): Payer: Self-pay | Admitting: Internal Medicine

## 2021-05-16 DIAGNOSIS — K8000 Calculus of gallbladder with acute cholecystitis without obstruction: Secondary | ICD-10-CM

## 2021-05-16 DIAGNOSIS — D696 Thrombocytopenia, unspecified: Secondary | ICD-10-CM | POA: Diagnosis not present

## 2021-05-16 DIAGNOSIS — R930 Abnormal findings on diagnostic imaging of skull and head, not elsewhere classified: Secondary | ICD-10-CM | POA: Diagnosis not present

## 2021-05-16 DIAGNOSIS — J439 Emphysema, unspecified: Secondary | ICD-10-CM

## 2021-05-16 DIAGNOSIS — D701 Agranulocytosis secondary to cancer chemotherapy: Secondary | ICD-10-CM | POA: Diagnosis not present

## 2021-05-16 DIAGNOSIS — T451X5A Adverse effect of antineoplastic and immunosuppressive drugs, initial encounter: Secondary | ICD-10-CM

## 2021-05-16 DIAGNOSIS — K838 Other specified diseases of biliary tract: Secondary | ICD-10-CM | POA: Diagnosis not present

## 2021-05-16 DIAGNOSIS — K529 Noninfective gastroenteritis and colitis, unspecified: Secondary | ICD-10-CM | POA: Diagnosis not present

## 2021-05-16 DIAGNOSIS — C3491 Malignant neoplasm of unspecified part of right bronchus or lung: Secondary | ICD-10-CM

## 2021-05-16 DIAGNOSIS — Z7189 Other specified counseling: Secondary | ICD-10-CM

## 2021-05-16 DIAGNOSIS — R7989 Other specified abnormal findings of blood chemistry: Secondary | ICD-10-CM

## 2021-05-16 DIAGNOSIS — Z66 Do not resuscitate: Secondary | ICD-10-CM

## 2021-05-16 DIAGNOSIS — I69359 Hemiplegia and hemiparesis following cerebral infarction affecting unspecified side: Secondary | ICD-10-CM

## 2021-05-16 DIAGNOSIS — Z515 Encounter for palliative care: Secondary | ICD-10-CM

## 2021-05-16 LAB — CBC
HCT: 26.8 % — ABNORMAL LOW (ref 39.0–52.0)
Hemoglobin: 9 g/dL — ABNORMAL LOW (ref 13.0–17.0)
MCH: 28.8 pg (ref 26.0–34.0)
MCHC: 33.6 g/dL (ref 30.0–36.0)
MCV: 85.9 fL (ref 80.0–100.0)
Platelets: 18 10*3/uL — CL (ref 150–400)
RBC: 3.12 MIL/uL — ABNORMAL LOW (ref 4.22–5.81)
RDW: 18.4 % — ABNORMAL HIGH (ref 11.5–15.5)
WBC: 2 10*3/uL — ABNORMAL LOW (ref 4.0–10.5)
nRBC: 0 % (ref 0.0–0.2)

## 2021-05-16 LAB — CBC WITH DIFFERENTIAL/PLATELET
Abs Immature Granulocytes: 0.04 10*3/uL (ref 0.00–0.07)
Basophils Absolute: 0 10*3/uL (ref 0.0–0.1)
Basophils Relative: 0 %
Eosinophils Absolute: 0 10*3/uL (ref 0.0–0.5)
Eosinophils Relative: 0 %
HCT: 22.7 % — ABNORMAL LOW (ref 39.0–52.0)
Hemoglobin: 7.3 g/dL — ABNORMAL LOW (ref 13.0–17.0)
Immature Granulocytes: 4 %
Lymphocytes Relative: 21 %
Lymphs Abs: 0.2 10*3/uL — ABNORMAL LOW (ref 0.7–4.0)
MCH: 28.1 pg (ref 26.0–34.0)
MCHC: 32.2 g/dL (ref 30.0–36.0)
MCV: 87.3 fL (ref 80.0–100.0)
Monocytes Absolute: 0.4 10*3/uL (ref 0.1–1.0)
Monocytes Relative: 39 %
Neutro Abs: 0.4 10*3/uL — CL (ref 1.7–7.7)
Neutrophils Relative %: 36 %
Platelets: 20 10*3/uL — CL (ref 150–400)
RBC: 2.6 MIL/uL — ABNORMAL LOW (ref 4.22–5.81)
RDW: 20.1 % — ABNORMAL HIGH (ref 11.5–15.5)
WBC: 1.1 10*3/uL — CL (ref 4.0–10.5)
nRBC: 0 % (ref 0.0–0.2)

## 2021-05-16 LAB — GASTROINTESTINAL PANEL BY PCR, STOOL (REPLACES STOOL CULTURE)

## 2021-05-16 LAB — BLOOD GAS, ARTERIAL
Acid-base deficit: 2.5 mmol/L — ABNORMAL HIGH (ref 0.0–2.0)
Bicarbonate: 22.5 mmol/L (ref 20.0–28.0)
FIO2: 4 %
O2 Saturation: 86 %
Patient temperature: 37.4
pCO2 arterial: 40 mmHg (ref 32–48)
pH, Arterial: 7.36 (ref 7.35–7.45)
pO2, Arterial: 50 mmHg — ABNORMAL LOW (ref 83–108)

## 2021-05-16 LAB — COMPREHENSIVE METABOLIC PANEL
ALT: 26 U/L (ref 0–44)
AST: 33 U/L (ref 15–41)
Albumin: 2.7 g/dL — ABNORMAL LOW (ref 3.5–5.0)
Alkaline Phosphatase: 156 U/L — ABNORMAL HIGH (ref 38–126)
Anion gap: 4 — ABNORMAL LOW (ref 5–15)
BUN: 27 mg/dL — ABNORMAL HIGH (ref 8–23)
CO2: 22 mmol/L (ref 22–32)
Calcium: 7.6 mg/dL — ABNORMAL LOW (ref 8.9–10.3)
Chloride: 119 mmol/L — ABNORMAL HIGH (ref 98–111)
Creatinine, Ser: 1.05 mg/dL (ref 0.61–1.24)
GFR, Estimated: >60 mL/min (ref >60)
Glucose, Bld: 129 mg/dL — ABNORMAL HIGH (ref 70–99)
Potassium: 4 mmol/L (ref 3.5–5.1)
Sodium: 145 mmol/L (ref 135–145)
Total Bilirubin: 2.1 mg/dL — ABNORMAL HIGH (ref 0.3–1.2)
Total Protein: 6.1 g/dL — ABNORMAL LOW (ref 6.5–8.1)

## 2021-05-16 LAB — MAGNESIUM: Magnesium: 1.6 mg/dL — ABNORMAL LOW (ref 1.7–2.4)

## 2021-05-16 LAB — URINE CULTURE: Culture: NO GROWTH

## 2021-05-16 LAB — LACTIC ACID, PLASMA: Lactic Acid, Venous: 1 mmol/L (ref 0.5–1.9)

## 2021-05-16 LAB — PREPARE RBC (CROSSMATCH)

## 2021-05-16 LAB — AMMONIA: Ammonia: 31 umol/L (ref 9–35)

## 2021-05-16 MED ORDER — SODIUM CHLORIDE 0.9% IV SOLUTION: Freq: Once | INTRAVENOUS | Status: DC

## 2021-05-16 MED ORDER — SODIUM CHLORIDE 0.9% IV SOLUTION: Freq: Once | INTRAVENOUS | Status: AC

## 2021-05-16 MED ORDER — MAGNESIUM SULFATE 2 GM/50ML IV SOLN
2.0000 g | Freq: Once | INTRAVENOUS | Status: AC
  Administered 2021-05-16: 2 g via INTRAVENOUS
  Filled 2021-05-16: qty 50

## 2021-05-16 MED ORDER — HYDROMORPHONE HCL 1 MG/ML IJ SOLN
2.0000 mg | INTRAMUSCULAR | Status: DC | PRN
  Administered 2021-05-16 – 2021-05-17 (×5): 2 mg via INTRAVENOUS
  Filled 2021-05-16 (×5): qty 2

## 2021-05-16 MED ORDER — HYDROMORPHONE HCL 1 MG/ML IJ SOLN
2.0000 mg | INTRAMUSCULAR | Status: DC | PRN
  Administered 2021-05-16: 2 mg via INTRAVENOUS
  Filled 2021-05-16: qty 2

## 2021-05-16 MED ORDER — REVEFENACIN 175 MCG/3ML IN SOLN
175.0000 ug | Freq: Every day | RESPIRATORY_TRACT | Status: DC
  Administered 2021-05-16 – 2021-05-17 (×2): 175 ug via RESPIRATORY_TRACT
  Filled 2021-05-16 (×3): qty 3

## 2021-05-16 MED ORDER — DEXMEDETOMIDINE HCL IN NACL 400 MCG/100ML IV SOLN
0.4000 ug/kg/h | INTRAVENOUS | Status: DC
  Administered 2021-05-16 – 2021-05-18 (×14): 1.2 ug/kg/h via INTRAVENOUS
  Filled 2021-05-16 (×13): qty 100

## 2021-05-16 MED ORDER — HYDROMORPHONE HCL 1 MG/ML IJ SOLN
0.5000 mg | Freq: Once | INTRAMUSCULAR | Status: AC | PRN
  Administered 2021-05-16: 0.5 mg via INTRAVENOUS

## 2021-05-16 MED ORDER — SODIUM CHLORIDE 0.9 % IV SOLN
500.0000 mg | Freq: Every day | INTRAVENOUS | Status: DC
  Administered 2021-05-16: 500 mg via INTRAVENOUS
  Filled 2021-05-16 (×2): qty 5

## 2021-05-16 MED ORDER — HYDROMORPHONE HCL 1 MG/ML IJ SOLN
1.0000 mg | INTRAMUSCULAR | Status: DC | PRN
  Administered 2021-05-16: 1 mg via INTRAVENOUS
  Filled 2021-05-16: qty 1

## 2021-05-16 MED ORDER — METHYLPREDNISOLONE SODIUM SUCC 40 MG IJ SOLR: 40.0000 mg | Freq: Every day | INTRAMUSCULAR | Status: DC

## 2021-05-16 MED ORDER — ACETAMINOPHEN 650 MG RE SUPP
975.0000 mg | Freq: Once | RECTAL | Status: AC
  Administered 2021-05-16: 975 mg via RECTAL
  Filled 2021-05-16: qty 2

## 2021-05-16 MED ORDER — ACETAMINOPHEN 650 MG RE SUPP
650.0000 mg | Freq: Three times a day (TID) | RECTAL | Status: DC | PRN
  Administered 2021-05-16 – 2021-05-17 (×2): 650 mg via RECTAL
  Filled 2021-05-16 (×2): qty 1

## 2021-05-16 MED ORDER — METHYLPREDNISOLONE SODIUM SUCC 40 MG IJ SOLR
40.0000 mg | Freq: Every day | INTRAMUSCULAR | Status: DC
  Administered 2021-05-16: 40 mg via INTRAVENOUS
  Filled 2021-05-16 (×2): qty 1

## 2021-05-16 MED ORDER — FUROSEMIDE 10 MG/ML IJ SOLN
20.0000 mg | Freq: Once | INTRAMUSCULAR | Status: AC
  Administered 2021-05-16: 20 mg via INTRAVENOUS
  Filled 2021-05-16: qty 2

## 2021-05-16 NOTE — Progress Notes (Signed)
Mr. Vanoverbeke is in the ICU.  He is still not cognitively oriented.  He had an MRI of the brain which did not show any bleed or infarct or malignancy. ? ?He had an MRI of the abdomen.  This was suboptimal. ? ?His white cell count is coming up.  It is 1.1.  Platelet count is 20,000.  Hemoglobin is 7.3.  I do think that he would benefit from another unit of blood. ? ?So far, cultures are all negative.  He is still on Merrem. ? ?His electrolytes show sodium 145.  Potassium 4.0.  BUN is 27 creatinine 1.05.  Calcium is 7.6 with an albumin of 2.7.  Bilirubin is 2.1. ? ?I think there is some question whether or not there is any kind of gallbladder issue.  That is why have the MRI done. ? ?Again his white cell count is coming up.  He is on Neupogen. ? ?His vital signs are temperature 98.6.  Pulse 101.  Blood pressure 136/95.  Oral exam shows dry oral mucosa.  There is no mucositis.  I see no thrush.  Lungs sound clear although there is some wheezing bilaterally.  Cardiac exam is tachycardic but regular.  Abdomen is soft.  Bowel sounds are decreased.  It is hard to say is any obvious pain.  Extremity shows no clubbing, cyanosis or edema.  Neurological exam shows lethargy. ? ?For right now, we still have to wait the white cells to keep improving.  At least, they are trending upward.  I would keep him on the Neupogen.  I would continue him on the [place]. ? ?He is on some nebulizers for the wheezing. ? ?His wife was with him.  She is very nice.  I had a nice talk with her. ? ?Hopefully, this confusion will improve as his blood counts improve.  Thankfully, the MRI did not show anything that was obvious with respect to a stroke or malignancy. ? ?I am sure that Dr. Julien Nordmann will follow-up in the morning. ? ?I do appreciate the incredible care that he is getting from the wonderful and compassionate staff in the ICU. ? ? ? ?Lattie Haw, MD ? ?Psalm 4:8 ?

## 2021-05-16 NOTE — Consult Note (Signed)
? ? ? ?Chief Complaint: ?Cholecystitis ? ?Referring Physician(s): ?K. Denzil Magnuson , MD ? ?Supervising Physician: Michaelle Birks ? ?Patient Status: [facility] - In-pt ? ?History of Present Illness: ?Jonathon [name] is a 71 y.o. male with relapsed small cell lung cancer on active chemotherapy admitted with pancytopenia, severe neutropenia and thrombocytopenia. ? ?CT showed= ?Cholelithiasis with markedly distended gallbladder and mild ?intrahepatic biliary ductal dilatation. The common duct is also ?prominent but tapers smoothly without appreciable calculus. ?  ?MRI abdomen MRCP was limited study but did not show any signs of choledocholithiasis, he has large gallstone along with diffuse intra and extrahepatic dilation.   ? ?On exam he has no right upper quadrant tenderness or rebound, negative for Percell Miller sign however his wife reports he had c/o RUQ pain several times at home. ?  ?He has acute deterioration of his mental status since yesterday, he is currently aphasic. ? ?We are asked to place a percutaneous cholecystostomy tube for pain control. ? ?Family at bedside for consent. ? ?Past Medical History:  ?Diagnosis Date  ? Cancer [facility])   ? CHF (congestive heart failure) ([place])   ? Chronic cough   ? COPD, severe ([place])   ? PT DENIES REGULAR DAILY INHALER ANY PRN  ? Coronary artery disease   ? CVA (cerebral vascular accident) ([place]) 02/22/2011  ? LEFT BRAIN STEM PONTINE HEMORRHAGE--  RIGHT SIDED WEAKNESS  ? Diabetes mellitus without complication ([place])   ? type 2  ? Dyspnea   ? GERD (gastroesophageal reflux disease)   ? Harsh voice quality   ? PT STATES NORMAL FOR HIM  ? High cholesterol   ? History of CHF (congestive heart failure) MONITORED BY DR Coletta Memos  ? DIASTOLIC  ? History of radiation therapy 03/13/19-03/20/19  ? SBRT Right Lung; Dr. Gery Pray  ? History of radiation therapy 02/17/20-02/28/20  ? Whole brain radiation; Dr. Gery Pray  ? History of radiation therapy 05/25/2020  ? 05/18/2020-05/25/2020  SBRT Right  Lung; Dr. Gery Pray  ? Hydrocele, left   ? Hypertension   ? NSTEMI (non-ST elevated myocardial infarction) ([place]) 12/2015  ? Pre-operative clearance   ? GIVEN BY DR Coletta Memos  ? Short of breath on exertion   ? Smoker   ? Weakness of right side of body   ? S/P CVA   FEB 2013  ? ? ?Past Surgical History:  ?Procedure Laterality Date  ? ABDOMINAL ANGIOGRAM  09/04/2012  ? Procedure: ABDOMINAL ANGIOGRAM;  Surgeon: Laverda Page, MD;  Location: [facility] CATH LAB;  Service: Cardiovascular;;  ? CARDIAC CATHETERIZATION  05-02-2011  DR Johnsie Cancel  ? MILD NONOBSTRUCTIVE CAD/ LAD, OM , AND D1/ EF 60^  ? CARDIAC CATHETERIZATION  AUG 2000  ? NON-OBSTRUTIVE CAD/ EF 68%/ 25% PROXIMAL LAD/ 20% MID CIRCUMFLEX  ? CARDIAC CATHETERIZATION [name] 12/23/2015  ? Procedure: Left Heart Cath and Coronary Angiography;  Surgeon: Adrian Prows, MD;  Location: [facility] CV LAB;  Service: Cardiovascular;  Laterality: [name];  ? CARDIAC CATHETERIZATION [name] 12/24/2015  ? Procedure: Coronary Stent Intervention;  Surgeon: Adrian Prows, MD;  Location: [place] CV LAB;  Service: Cardiovascular;  Laterality: [name];  ? CORONARY ANGIOPLASTY    ? CORONARY STENT PLACEMENT  12/24/2015  ?  PTCA and stenting of the distal RCA   ? HYDROCELE EXCISION Left 04/24/2012  ? Procedure: HYDROCELECTOMY ADULT;  Surgeon: Fredricka Bonine, MD;  Location: [facility];  Service: Urology;  Laterality: Left;  90 mins req for this case ? ? ?  ?  [place]  ? LEFT AND RIGHT HEART CATHETERIZATION WITH CORONARY ANGIOGRAM [name] 09/04/2012  ? Procedure: LEFT AND RIGHT HEART CATHETERIZATION WITH CORONARY ANGIOGRAM;  Surgeon: Laverda Page, MD;  Location: [facility] CATH LAB;  Service: Cardiovascular;  Laterality: [name];  ? LEFT HEART CATHETERIZATION WITH CORONARY ANGIOGRAM [name] 05/02/2011  ? Procedure: LEFT HEART CATHETERIZATION WITH CORONARY ANGIOGRAM;  Surgeon: Josue Hector, MD;  Location: [facility] CATH LAB;  Service: Cardiovascular;  Laterality: [name];  ? THORACOTOMY/LOBECTOMY  Right 03-06-2008  ? AND STAPLING OF BLEBS FOR SPONTANOUS PNEUMOTHORAX  ? TRANSTHORACIC ECHOCARDIOGRAM  02-17-2011  ? MODERATE LVH/ LVSF NORMAL/ EF 97-67%/ GRADE I DIASTOLIC DYSFUNCTION  ? ? ?Allergies: ?Pork-derived products and Atorvastatin ? ?Medications: ?Prior to Admission medications   ?Medication Sig Start Date End Date Taking? Authorizing Provider  ?albuterol (PROVENTIL) (2.5 MG/3ML) 0.083% nebulizer solution Take 2.5 mg by nebulization every 4 (four) hours as needed for wheezing or shortness of breath.   Yes [provider]  ?Albuterol Sulfate (PROAIR RESPICLICK) 341 (90 BASE) MCG/ACT AEPB Inhale 2 puffs into the lungs 4 (four) times daily as needed. ?Patient taking differently: Inhale 2 puffs into the lungs 4 (four) times daily as needed (wheezing). 02/24/14  Yes Parrett, Tammy S, NP  ?amLODipine (NORVASC) 10 MG tablet TAKE 1 TABLET BY MOUTH ONCE A DAY ?Patient taking differently: Take 10 mg by mouth every evening. 02/15/21  Yes   ?atorvastatin (LIPITOR) 20 MG tablet Take one tablet by mouth daily. ?Patient taking differently: Take 20 mg by mouth daily. 02/23/21  Yes   ?bisoprolol (ZEBETA) 5 MG tablet Take one tablet (5 mg dose) by mouth daily. ?Patient taking differently: Take 5 mg by mouth every evening. 11/12/20  Yes   ?cloNIDine (CATAPRES) 0.3 MG tablet TAKE 1 TABLET BY MOUTH TWICE DAILY 05/19/20  Yes   ?cyclobenzaprine (FLEXERIL) 5 MG tablet TAKE 1 TABLET BY MOUTH AT BEDTIME AS NEEDED FOR MUSCLE SPASMS ?Patient taking differently: Take 5 mg by mouth at bedtime. 04/27/21  Yes   ?DULoxetine (CYMBALTA) 30 MG capsule Take one capsule (30 mg dose) by mouth daily. ?Patient taking differently: Take 30 mg by mouth at bedtime. 11/12/20  Yes   ?gabapentin (NEURONTIN) 400 MG capsule TAKE 2 CAPSULES BY MOUTH THREE TIMES A DAY ?Patient taking differently: Take 1,200 mg by mouth 2 (two) times daily. 03/30/21  Yes   ?losartan (COZAAR) 100 MG tablet TAKE 1 TABLET BY MOUTH DAILY. ?Patient taking differently: Take 100  mg by mouth daily. 01/12/21 01/12/22 Yes Adrian Prows, MD  ?metFORMIN (GLUCOPHAGE) 500 MG tablet TAKE 1 TABLET BY MOUTH ONCE A DAY WITH BREAKFAST ?Patient taking differently: Take 500 mg by mouth daily with breakfast. 05/19/20  Yes   ?nitroGLYCERIN (NITROSTAT) 0.4 MG SL tablet PLACE 1 TABLET UNDER THE TONGUE EVERY 5  MINUTES AS NEEDED AS DIRECTED. ?Patient taking differently: 0.4 mg every 5 (five) minutes as needed for chest pain. 11/16/20 11/16/21 Yes Adrian Prows, MD  ?nortriptyline (PAMELOR) 25 MG capsule TAKE 1 CAPSULE BY MOUTH EVERY NIGHT AT BEDTIME 03/16/21  Yes   ?ondansetron (ZOFRAN) 8 MG tablet Take 0.5 tablets (4 mg total) by mouth every 6 (six) hours. ?Patient taking differently: Take 4 mg by mouth every 4 (four) hours as needed for nausea or vomiting. 04/23/21  Yes Azucena Cecil, PA-C  ?pantoprazole (PROTONIX) 20 MG tablet TAKE 1 TABLET BY MOUTH DAILY ?Patient taking differently: Take 20 mg by mouth every evening. 03/15/21 03/15/22 Yes Adrian Prows, MD  ?polyethylene glycol [facility])  17 g packet Take 17 g by mouth daily as needed for mild constipation. 03/23/21  Yes Samuella Cota, MD  ?prochlorperazine (COMPAZINE) 10 MG tablet Take 1 tablet by mouth every 6  hours as needed for nausea or vomiting. 05/03/21  Yes Heilingoetter, Cassandra L, PA-C  ?SYMBICORT 160-4.5 MCG/ACT inhaler INHALE 2 PUFFS INTO THE LUNGS TWICE DAILY 11/12/20  Yes   ?vitamin B-12 (CYANOCOBALAMIN) 1000 MCG tablet Take 1,000 mcg by mouth daily.   Yes [provider]  ?methylPREDNISolone (MEDROL DOSEPAK) 4 MG TBPK tablet Use as instructed. ?Patient not taking: Reported on 04/18/2021 04/12/21   [name], MD  ?potassium chloride SA (KLOR-CON M) 20 MEQ tablet Take 1 tablet (20 mEq total) by mouth 2 (two) times daily. ?Patient not taking: Reported on 05/01/2021 05/03/21   Heilingoetter, Cassandra L, PA-C  ?senna (SENOKOT) 8.6 MG TABS tablet Take 1 tablet (8.6 mg total) by mouth at bedtime as needed for mild constipation. ?Patient  not taking: Reported on 05/13/2021 03/23/21   Samuella Cota, MD  ?tamsulosin (FLOMAX) 0.4 MG CAPS capsule Take one capsule (0.4 mg dose) by mouth daily. ?Patient not taking: Reported on 04/22/2021 05/13/21

## 2021-05-16 NOTE — Progress Notes (Addendum)
?PROGRESS NOTE ? ? ? ?Jonathon [name]  HYI:[id number] DOB: 1950-07-21 DOA: 05/13/2021 ?PCP: [name], MD  ? ? ? ?Brief Narrative:  ? 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 ?Details please see below ? ? ? ? ?Subjective: ? ?Very confused, still has agitation while on precedex, on restrains, RN reports dilaudid will help calm him down briefly ?Wife at bedside report patient c/o RUQ pain at home ? ?Assessment & Plan: ? Principal Problem: ?  Enteritis ?Active Problems: ?  Pancytopenia ([place]) ?  Common bile duct dilatation ?  AKI (acute kidney injury) ([place]) ?  COPD (chronic obstructive pulmonary disease) ([place]) ?  Primary lung small cell carcinoma, right ([place]) ?  Abnormal head CT ?  Type 2 diabetes mellitus without complication, without long-term current use of insulin ([place]) ?  Hypertension associated with diabetes ([place]) ?  Coronary artery disease ?  CVA, old, hemiparesis ([place]) ?  Hyperlipidemia associated with type 2 diabetes mellitus ([place]) ?  Diarrhea ?  Thrombocytopenia ([place]) ?  Gallstones and inflammation of gallbladder without obstruction ?  Abnormal LFTs ? ? ? ?Assessment and Plan: ? ?Acute metabolic encephalopathy of unclear etiology, POA ?-CT head possible subacute CVA, MRI brain negative  ?-ammonia level unremarkable,  blood culture no growth, C. difficile negative , GI PCR panel and urine culture in process  ?--Seen by critical care, started on Precedex drip, remain agitated , requiring restraints , per RN dilaudid helped  ? ? ? ?Distended gallbladder with cholelithiasis, diffuse intra and extrahepatic dilation , fecal fluids  filled bowel loops, possible enteritis , findings on ab imaging on admission  ?-C. difficile testing negative,  GI PCR pending ? -Ast/alt unremarkable, mild elevated total bilirubin and alk phos  ?-ab exam unreliable in the setting of encephalopathy , but dilaudid helped calm patient  ?-per family patient has been c/o RUQ for two weeks, then started to have poor oral intake, then started to have n/v prior to coming to the hospital ?-seen by GI no able to do intervention due to pancytopenia,  ?-IR consulted  for perc drain , will transfuse plt x2units up to 50 , then plt x1 on call for IR drain placement ?-continue meropenem ? ?Pancytopenia, POA ?last chemo on 4/17 ?Not appear to have external bleed ?Supportive transfusion ?G-CSF per oncology ?Improving, case discussed with oncology who recommend transfuse pltx2units to bring plt up to 50 for perc drain ? ?Poor IV access, can do picc line if still needed after plt transfusion ? ? ?AKI (acute kidney injury) (HCC)/acute urinary retention, presents on admission  ?-400 cc urine drained after Foley insertion ?-Also appeared dehydrated, continue hydration, transfusion  ?-cr improved /normalized on repeat  ? ?Hypomagnesemia, replace mag ? ? ?Primary lung small cell carcinoma, right ([place]), recurrent ?Follows with oncology, Dr. [name].  On active treatment with Zepzelca,last on 4/17 ?COPD (chronic obstructive pulmonary disease) ([place]) ?Continue Dulera, albuterol as needed, supplemental oxygen as needed, started on steroids due to wheezing on 4/30, pccm following  ?Chronic hypoxic respiratory failure on home O2 ? ? ?Hypertension  ?Hold home losartan, amlodipine, clonidine, bisoprolol as he is high risk for hypotension in setting of hypovolemia. ?Type  2 diabetes mellitus without complication, without long-term current use of insulin ([place]) ?Hold home metformin.  Monitor CBGs and add SSI if needed. ?Hyperlipidemia  ?Continue atorvastatin. ?CVA, old, hemiparesis  ([place]) ?History of left-sided hemorrhagic pontine stroke. ?Coronary artery disease ?S/p PCI with DES to distal RCA December 2017.  Denies chest pain.  Continue atorvastatin.  Hold aspirin with severe thrombocytopenia. ? ? ?FTT; per family he stopped driving but 48-monthago, he was not able to walk independently about 3 months ago, ongoing weight loss, progressive weakness, palliative care consulted ? ?Nutritional Assessment: ?The patient?s BMI is: Body mass index is 22.38 kg/m?.Marland KitchenMarland Kitchen?Seen by dietician.  I agree with the assessment and plan as outlined below: ?Nutrition Status: ?Nutrition Problem: Increased nutrient needs ?Etiology: cancer and cancer related treatments ?Signs/Symptoms: estimated needs ?Interventions: Boost Breeze ? ?. ? ?   ? ?I have Reviewed nursing notes, Vitals, pain scores, I/o's, Lab results and  imaging results since pt's last encounter, details please see discussion above  ?I ordered the following labs:  ?Unresulted Labs (From admission, onward)  ? ?  Start     Ordered  ? 05/17/21 0500  Lipase, blood  Tomorrow morning,   R       ?Question:  Specimen collection method  Answer:  Lab=Lab collect  ? 05/16/21 0750  ? 05/17/21 0500  Magnesium  Tomorrow morning,   R       ?Question:  Specimen collection method  Answer:  Lab=Lab collect  ? 05/16/21 0750  ? 05/16/21 0903  Legionella Pneumophila Serogp 1 Ur Ag  Once,   R       ? 05/16/21 0902  ? 05/15/21 0719  CBC with Differential/Platelet  Daily,   R     ? 05/15/21 0717  ? 05/15/21 0719  Comprehensive metabolic panel  Daily,   R     ? 05/15/21 0717  ? 05/05/2021 2220  Gastrointestinal Panel by PCR , Stool  (Gastrointestinal Panel by PCR, Stool                                                                                                                                                     **Does Not include CLOSTRIDIUM DIFFICILE testing. **If CDIFF testing is needed, place order from the "C Difficile Testing" order set.**)  Once,   R       ? 04/26/2021 2219   ? 04/19/2021 1633  Lipase, blood  Once,   STAT       ? 05/05/2021 1633  ? 05/08/2021 1633  Culture, blood (Routine X 2) w Reflex to ID Panel  BLOOD CULTURE X 2,   R     ? 05/16/2021 1633  ? ?  ?  ? ?  ? ? ? ?DVT prophylaxis: SCDs Start: 04/23/2021 2221 ? ? ?Code Status:   Code Status: DNR ? ?  Family Communication: Wife at bedside daily , son over the phone on 4/29, son and daughter over the phone on 4/30 ?Disposition:  ? ?Dispo: The patient is from: Home ?             Anticipated d/c is to: TBD ?             Anticipated d/c date is: TBD ? ?Antimicrobials:   ? ?Anti-infectives (From admission, onward)  ? ? Start     Dose/Rate Route Frequency Ordered Stop  ? 05/16/21 1245  azithromycin (ZITHROMAX) 500 mg in sodium chloride 0.9 % 250 mL IVPB       ? 500 mg ?250 mL/hr over 60 Minutes Intravenous Daily 05/16/21 1154 05/21/21 0959  ? 05/15/21 2100  meropenem (MERREM) 1 g in sodium chloride 0.9 % 100 mL IVPB       ? 1 g ?200 mL/hr over 30 Minutes Intravenous Every 8 hours 05/15/21 1534    ? 05/15/21 1000  meropenem (MERREM) 1 g in sodium chloride 0.9 % 100 mL IVPB  Status:  Discontinued       ? 1 g ?200 mL/hr over 30 Minutes Intravenous Every 12 hours 05/05/2021 2224 05/15/21 1534  ? 04/18/2021 2130  meropenem (MERREM) 1 g in sodium chloride 0.9 % 100 mL IVPB       ? 1 g ?200 mL/hr over 30 Minutes Intravenous  Once 05/13/2021 2126 05/12/2021 2321  ? 05/16/2021 2115  meropenem (MERREM) 500 mg in sodium chloride 0.9 % 100 mL IVPB  Status:  Discontinued       ? 500 mg ?200 mL/hr over 30 Minutes Intravenous  Once 04/21/2021 2103 05/10/2021 2126  ? ?  ? ? ? ? ? ?Objective: ?Vitals:  ? 05/16/21 1100 05/16/21 1200 05/16/21 1300 05/16/21 1400  ?BP: (!) 154/124 (!) 130/55 (!) 132/55 (!) 129/109  ?Pulse: (!) 111 96 91 (!) 106  ?Resp: (!) 26 14 13  (!) 27  ?Temp: 99.1 ?F (37.3 ?C)     ?TempSrc: Axillary     ?SpO2: 100% 94% 93% 93%  ?Weight:      ?Height:      ? ? ?Intake/Output Summary (Last 24 hours) at 05/16/2021 1503 ?Last data filed at 05/16/2021  1304 ?Gross per 24 hour  ?Intake 2922.58 ml  ?Output 550 ml  ?Net 2372.58 ml  ? ?Filed Weights  ? 05/13/2021 1507  ?Weight: 74.8 kg  ? ? ?Examination: ? ?General exam: very confused and agitated, exam is not reliable due

## 2021-05-16 NOTE — Progress Notes (Signed)
? ?NAME:  Jonathon [name], MRN:  [id number], DOB:  May 15, 1950, LOS: 2 ?ADMISSION DATE:  04/27/2021, CONSULTATION DATE:  05/15/21 ?REFERRING MD:  Florencia Reasons, MD CHIEF COMPLAINT:  AMS/Agitation ? ?History of Present Illness:  ?Jonathon [name] is a 71 year old male with extensive stage small cell lung cancer on lurbinectedin last treatment on 05/03/21 who was noted to be pancytopenic on 05/12/21 and had a decline in his overall functional status and develop diarrhea/poor oral intake. He was admitted on 05/09/2021 where CT head showed possible subacute infarct and MRI was recommended. CT abdomen/pelvis showed markedly distended gallbladder with intra hepatic biliary ductal dilatation along with possible enteritis of the bowel. US of the abdomen showed cholelithiasis without findings of acute cholecystitis and intrahepatic ductal dilatation. Blood and urine cultures remain negative to date. He has been started on meropenem. He has been transfused 1 unit PRBCs and given Neupogen for the neutropenia.  ? ?GI was consulted for the biliary dilatation and do not recommend any procedures at this time due to his blood counts. They have recommended checking MRCP. ? ?PCCM has been consulted for intermittent agitation that is hindering him from getting MRI brain and MRCP with request for precedex. ? ?Pertinent  Medical History  ? ?CHF ?COPD ?CVA ?DMII ?Small Cell Lung Cancer ?GERD ?Hypertension ? ?Significant Hospital Events: ?Including procedures, antibiotic start and stop dates in addition to other pertinent events   ?4/28 admitted ?4/29 PCCM consulted for precedex ? ?Interim History / Subjective:  ? ?Patient remains very encephalopathic ?MRI brain did not show acute pathology ?MRCP was not adequate due to patient agitation, it did show dilation of biliary tree but no overt stone. Gall bladder did not appear acutely infected. ? ?Objective   ?Blood pressure (!) 154/124, pulse (!) 111, temperature 99.1 ?F (37.3 ?C), temperature source  Axillary, resp. rate (!) 26, height 6' (1.829 m), weight 74.8 kg, SpO2 100 %. ?   ?   ? ?Intake/Output Summary (Last 24 hours) at 05/16/2021 1151 ?Last data filed at 05/16/2021 1100 ?Gross per 24 hour  ?Intake 2053.14 ml  ?Output 1150 ml  ?Net 903.14 ml  ? ?Filed Weights  ? 04/25/2021 1507  ?Weight: 74.8 kg  ? ? ?Examination: ?General: elderly male, restless trying to get out of bed ?HENT: [place]/AT, moist mucous membranes, sclera anicteric ?Lungs: diminished breath sounds, diffuse wheezing ?Cardiovascular: rrr, no murmurs ?Abdomen: soft, non-distended, bowel sounds present ?Extremities: warm, no edema ?Neuro: sedated on precedex ?GU: n/a ? ?Resolved Hospital Problem list   ? ? ?Assessment & Plan:  ?Altered Mental Status ?Pancytopenia ?Enteritis ?Dilated Biliary Tree ?COPD with acute exacerbation ?? Pneumonia ?Small Cell Lung Cancer ? ?Plan: ?- Continue precedex for agitation. Also receiving dilaudid, seroquel and ativan. ?- Continue ICS/LABA/LAMA nebulizer treatments ?- Start IV solumedrol 40mg  for COPD exacerbation given wheezing on exam ?- Add azithromycin coverage to meropenem for aytpical pneumonia coverage such as legionella ?- rest of management per primary team ? ?Best Practice (right click and "Reselect all SmartList Selections" daily)  ? ?Per primary team ? ?Labs   ?CBC: ?Recent Labs  ?Lab 05/12/21 ?1413 04/28/2021 ?1615 05/15/21 ?1427 05/16/21 ?0236 05/16/21 ?4854  ?WBC 0.3* 0.4* 0.8* 1.1* 1.5*  ?NEUTROABS 0.1* 0.1* 0.1* 0.4*  --   ?HGB 7.3* 6.1* 5.9* 7.3* 8.6*  ?HCT 22.3* 18.4* 17.7* 22.7* 25.8*  ?MCV 83.2 85.6 85.5 87.3 87.5  ?PLT 16* 9* 20* 20* 19*  ? ? ?Basic Metabolic Panel: ?Recent Labs  ?Lab 05/12/21 ?1413 04/18/2021 ?1615 05/15/21 ?  1427 05/16/21 ?0236  ?NA 133* 137 144 145  ?K 3.3* 3.8 3.2* 4.0  ?CL 102 109 115* 119*  ?CO2 18* 20* 20* 22  ?GLUCOSE 121* 133* 120* 129*  ?BUN 46* 44* 30* 27*  ?CREATININE 2.43* 1.73* 1.13 1.05  ?CALCIUM 7.9* 7.7* 7.7* 7.6*  ?MG  --   --   --  1.6*  ? ?GFR: ?Estimated Creatinine  Clearance: 68.3 mL/min (by C-G formula based on SCr of 1.05 mg/dL). ?Recent Labs  ?Lab 05/02/2021 ?1615 05/15/21 ?1427 05/16/21 ?0236 05/16/21 ?0211  ?WBC 0.4* 0.8* 1.1* 1.5*  ?LATICACIDVEN 0.8  --  1.0  --   ? ? ?Liver Function Tests: ?Recent Labs  ?Lab 05/12/21 ?1413 04/18/2021 ?1615 05/15/21 ?1427 05/16/21 ?0236  ?AST 35 37 35 33  ?ALT 27 27 27 26   ?ALKPHOS 78 147* 133* 156*  ?BILITOT 0.8 2.6* 2.1* 2.1*  ?PROT 6.9 6.5 6.2* 6.1*  ?ALBUMIN 3.1* 2.8* 2.8* 2.7*  ? ?No results for input(s): LIPASE, AMYLASE in the last 168 hours. ?Recent Labs  ?Lab 05/16/21 ?0236  ?AMMONIA 31  ? ? ?ABG ?   ?Component Value Date/Time  ? PHART 7.36 05/16/2021 0918  ? PCO2ART 40 05/16/2021 0918  ? PO2ART 50 (L) 05/16/2021 1552  ? HCO3 22.5 05/16/2021 0918  ? TCO2 30 09/04/2012 1025  ? ACIDBASEDEF 2.5 (H) 05/16/2021 0802  ? O2SAT 86 05/16/2021 0918  ?  ? ?Coagulation Profile: ?Recent Labs  ?Lab 05/15/21 ?1427  ?INR 1.1  ? ? ?Cardiac Enzymes: ?No results for input(s): CKTOTAL, CKMB, CKMBINDEX, TROPONINI in the last 168 hours. ? ?HbA1C: ?Hgb A1c MFr Bld  ?Date/Time Value Ref Range Status  ?03/20/2021 07:11 PM 6.6 (H) 4.8 - 5.6 % Final  ?  Comment:  ?  (NOTE) ?Pre diabetes:          5.7%-6.4% ? ?Diabetes:              >6.4% ? ?Glycemic control for   <7.0% ?adults with diabetes ?  ?09/28/2013 07:20 AM 6.7 (H) <5.7 % Final  ?  Comment:  ?  (NOTE) ?                                                                      ?According to the ADA Clinical Practice Recommendations for 2011, when ?HbA1c is used as a screening test: ? >=6.5%   Diagnostic of Diabetes Mellitus ?          (if abnormal result is confirmed) ?5.7-6.4%   Increased risk of developing Diabetes Mellitus ?References:Diagnosis and Classification of Diabetes Mellitus,Diabetes ?MVVK,1224,49(PNPYY 1):S62-S69 and Standards of Medical Care in         ?Diabetes - 2011,Diabetes Care,2011,34 (Suppl 1):S11-S61.  ? ? ?CBG: ?No results for input(s): GLUCAP in the last 168 hours. ? ? ?Critical care  time: 30 minutes ?  ? ?Freda Jackson, MD ?[facility] ?Office: [phone] ? ? ?See Amion for personal pager ?PCCM on call pager [phone] until 7pm. ?Please call Elink 7p-7a. [phone] ? ? ?  ?

## 2021-05-16 NOTE — Progress Notes (Addendum)
[facility] ROUNDING NOTE ? ? ?Subjective: ?Significant decline in mental status, he is currently nonverbal with restless movement, is under restraints.  His not oriented or respond to his name ? ? ?Objective: ?Vital signs in last 24 hours: ?Temp:  [97.2 ?F (36.2 ?C)-99.4 ?F (37.4 ?C)] 99.4 ?F (37.4 ?C) (04/30 3220) ?Pulse Rate:  [68-113] 101 (04/30 0849) ?Resp:  [16-32] 23 (04/30 0849) ?BP: (106-196)/(51-144) 125/63 (04/30 0849) ?SpO2:  [90 %-99 %] 93 % (04/30 0921) ?Last BM Date : 05/15/21 ?General: Restless, in restraints, alert but not oriented ?Abdomen: Soft, mild distention, tympanic but no rebound or tenderness, negative Murphy's ? ? ? ? ?Intake/Output from previous day: ?04/29 0701 - 04/30 0700 ?In: 2242.8 [P.O.:120; I.V.:364.4; Blood:1162.7; IV Piggyback:595.8] ?Out: 1450 [Urine:1450] ?Intake/Output this shift: ?Total I/O ?In: 3 [I.V.:3] ?Out: -  ? ? ?Lab Results: ?Recent Labs  ?  05/15/21 ?1427 05/16/21 ?0236 05/16/21 ?2542  ?WBC 0.8* 1.1* 1.5*  ?HGB 5.9* 7.3* 8.6*  ?PLT 20* 20* 19*  ?MCV 85.5 87.3 87.5  ? ?BMET ?Recent Labs  ?  05/13/2021 ?1615 05/15/21 ?1427 05/16/21 ?0236  ?NA 137 144 145  ?K 3.8 3.2* 4.0  ?CL 109 115* 119*  ?CO2 20* 20* 22  ?GLUCOSE 133* 120* 129*  ?BUN 44* 30* 27*  ?CREATININE 1.73* 1.13 1.05  ?CALCIUM 7.7* 7.7* 7.6*  ? ?LFT ?Recent Labs  ?  05/03/2021 ?1615 05/15/21 ?1427 05/16/21 ?0236  ?PROT 6.5 6.2* 6.1*  ?ALBUMIN 2.8* 2.8* 2.7*  ?AST 37 35 33  ?ALT 27 27 26   ?ALKPHOS 147* 133* 156*  ?BILITOT 2.6* 2.1* 2.1*  ?BILIDIR  --  1.0*  --   ? ?PT/INR ?Recent Labs  ?  05/15/21 ?1427  ?INR 1.1  ? ? ? ? ?Imaging/Other results: ?CT Head Wo Contrast ? ?Result Date: 05/16/2021 ?CLINICAL DATA:  History of lung cancer presenting with altered mental status. EXAM: CT HEAD WITHOUT CONTRAST TECHNIQUE: Contiguous axial images were obtained from the base of the skull through the vertex without intravenous contrast. RADIATION DOSE REDUCTION: This exam was performed according to the departmental  dose-optimization program which includes automated exposure control, adjustment of the mA and/or kV according to patient size and/or use of iterative reconstruction technique. COMPARISON:  Plain brain CT, dated August 29, 2011 and MR head dated April 21, 2021 FINDINGS: Brain: There is moderate severity cerebral atrophy with widening of the extra-axial spaces and ventricular dilatation. There are areas of decreased attenuation within the white matter tracts of the supratentorial brain, consistent with microvascular disease changes. Small bilateral chronic basal ganglia lacunar infarcts are noted. A chronic left frontal lobe infarct is seen. A 2.3 cm x 1.9 cm area of white matter low attenuation is seen along the anteromedial aspect of the cerebellum on the left (axial CT images 7 and 8, CT series 3). This is not clearly visualized on the prior MRI. There is no evidence of associated mass effect or midline shift. Vascular: No hyperdense vessel or unexpected calcification. Skull: Normal. Negative for fracture or focal lesion. Sinuses/Orbits: No acute finding. Other: None. IMPRESSION: 1. Findings along the anteromedial aspect of the cerebellum on the left, as described above, which may represent sequelae associated with a subacute infarct. MRI correlation is recommended. 2. Chronic left frontal lobe infarct. 3. Generalized cerebral atrophy with chronic white matter small vessel ischemia. Electronically Signed   By: Virgina Norfolk [name]   On: 04/19/2021 18:26  ? ?MR BRAIN WO CONTRAST ? ?Result Date: 05/15/2021 ?CLINICAL DATA:  Altered mental  status, history of lung cancer EXAM: MRI HEAD WITHOUT CONTRAST TECHNIQUE: Multiplanar, multiecho pulse sequences of the brain and surrounding structures were obtained without intravenous contrast. COMPARISON:  04/21/2021 FINDINGS: Brain: No restricted diffusion to suggest acute or subacute infarct. No acute hemorrhage, mass, mass effect, or midline shift. No hydrocephalus or  extra-axial collection. Redemonstrated remote left frontal cortical and subcortical infarct. Confluent T2 hyperintense signal in the periventricular white matter and pons, likely the sequela of moderate to severe chronic small vessel ischemic disease. Unchanged hemosiderin deposition in the pons, likely sequela of hypertensive microhemorrhage. Advanced cerebral atrophy for age. Vascular: Normal flow voids. Skull and upper cervical spine: Normal marrow signal. Sinuses/Orbits: Minimal mucosal thickening in the ethmoid air cells. The orbits are unremarkable. Other: Fluid in the right-greater-than-left mastoid air cells. IMPRESSION: No acute intracranial process. No evidence of acute or subacute infarct. Electronically Signed   By: Merilyn Baba [name]   On: 05/15/2021 21:54  ? ?CT Abdomen Pelvis W Contrast ? ?Result Date: 05/13/2021 ?CLINICAL DATA:  Abdominal pain, acute nonlocalized. Receiving chemotherapy and having acute diarrhea. EXAM: CT ABDOMEN AND PELVIS WITH CONTRAST TECHNIQUE: Multidetector CT imaging of the abdomen and pelvis was performed using the standard protocol following bolus administration of intravenous contrast. RADIATION DOSE REDUCTION: This exam was performed according to the departmental dose-optimization program which includes automated exposure control, adjustment of the mA and/or kV according to patient size and/or use of iterative reconstruction technique. CONTRAST:  73mL OMNIPAQUE IOHEXOL 300 MG/ML  SOLN COMPARISON:  None. FINDINGS: Lower chest: Emphysematous changes with large bibasilar emphysematous bulla. Right basilar atelectasis. Hepatobiliary: No focal liver abnormality is seen. Gallbladder is distended. 1 cm calculus in the dependent gallbladder. Mild intrahepatic biliary ductal dilatation. There is also prominence of the common duct measuring approximately 1.3 cm at the level of the pancreatic head. Pancreas: Unremarkable. No pancreatic ductal dilatation or surrounding inflammatory  changes. Spleen: Normal in size without focal abnormality. Adrenals/Urinary Tract: Adrenal glands are unremarkable. Stable simple cyst in the upper pole of the left kidney. No evidence of nephrolithiasis or hydronephrosis. No ureteral calculus. Bladder is unremarkable. Stomach/Bowel: Moderate-sized hiatal hernia. Stomach is within normal limits. Appendix not identified. Focal mildly dilated fluid-filled small bowel loops without significant wall enhancement. (Axial image 57; coronal image 46). Vascular/Lymphatic: Abdominal aorta is normal in caliber with advanced atherosclerotic calcifications and a tortuous course. No lymphadenopathy. Reproductive: Prostate is unremarkable. Other: No abdominal wall hernia or abnormality. No abdominopelvic ascites. Musculoskeletal: Mild degenerate disc disease of the lumbar spine. No acute osseous abnormality. IMPRESSION: 1. Cholelithiasis with markedly distended gallbladder and mild intrahepatic biliary ductal dilatation. The common duct is also prominent but tapers smoothly without appreciable calculus. Right upper quadrant sonogram could be obtained if there is clinical concern for acute cholecystitis. 2. Focal fluid-filled bowel loops which are normal in caliber, which may suggest enteritis. No evidence of bowel wall thickening or pneumatosis. 3.  Advanced emphysematous changes. 4.  Atherosclerotic disease of abdominal aorta and branch vessels. 5.  Additional chronic findings as above. Electronically Signed   By: Keane Police D.O.   On: 04/22/2021 18:29  ? ?MR ABDOMEN MRCP WO CONTRAST ? ?Result Date: 05/15/2021 ?CLINICAL DATA:  Right upper quadrant pain, cholelithiasis, dilated CBD on ultrasound. EXAM: MRI ABDOMEN WITHOUT CONTRAST  (INCLUDING MRCP) TECHNIQUE: Multiplanar multisequence MR imaging of the abdomen was performed. Heavily T2-weighted images of the biliary and pancreatic ducts were obtained, and three-dimensional MRCP images were rendered by post processing. COMPARISON:   Right upper quadrant ultrasound dated  04/22/2021. CT abdomen/pelvis dated 04/25/2021. FINDINGS: Study prematurely aborted following 2 sequences (axial T2 and diffusion/ADC). Patient could not tolerate additiona

## 2021-05-16 NOTE — Plan of Care (Signed)
?  Problem: Nutrition: ?Goal: Adequate nutrition will be maintained ?Outcome: Not Progressing ?  ?Problem: Coping: ?Goal: Level of anxiety will decrease ?Outcome: Not Progressing ?  ?Problem: Pain Managment: ?Goal: General experience of comfort will improve ?Outcome: Not Progressing ?  ?Problem: Safety: ?Goal: Non-violent Restraint(s) ?Outcome: Not Progressing ?  ?

## 2021-05-16 NOTE — Progress Notes (Signed)
? ? ? ?  Referral received for Genesis A Bartee :goals of care discussion. Chart reviewed and updates received from RN. Patient unable to engage in discussions. Confused and appears uncomfortable and agitated. Wife is at bedside attempting to calm along with IV team and RN. Poor vein access. They have been unable to gain IV access and unfortunately due to platelet count unable to place PICC.   ? ?PMT will re-attempt to contact family at a later time/date. RN to secure chat. Detailed note and recommendations to follow once GOC has been completed.  ? ?Thank you for your referral and allowing PMT to assist in Mr.Halen A Lauritsen's care.  ? ?Alda Lea, AGPCNP-BC ?Palliative Medicine Team  ?Phone: [phone] ?Pager: [phone] ?Amion: N. Cous[place]  ? ?NO CHARGE   ?

## 2021-05-16 NOTE — Consult Note (Signed)
? ?Palliative Care Consult Note  ?                                ?Date: 05/16/2021  ? ?Patient Name: Jonathon [name]  ?DOB: 02-28-1950  MRN: [id number]  Age / Sex: 71 y.o., male  ?PCP: [name], MD ?Referring Physician: Florencia Reasons, MD ? ?Reason for Consultation: Establishing goals of care ? ?HPI/Patient Profile: Palliative Care consult requested for goals of care discussion in this 71 y.o. 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 He was admitted on 04/19/2021 from home with generalized weakness, decreased intake, abdominal pain, and diarrhea.  ? ?Past Medical History:  ?Diagnosis Date  ? Cancer [facility])   ? CHF (congestive heart failure) ([place])   ? Chronic cough   ? COPD, severe ([place])   ? PT DENIES REGULAR DAILY INHALER ANY PRN  ? Coronary artery disease   ? CVA (cerebral vascular accident) ([place]) 02/22/2011  ? LEFT BRAIN STEM PONTINE HEMORRHAGE--  RIGHT SIDED WEAKNESS  ? Diabetes mellitus without complication ([place])   ? type 2  ? Dyspnea   ? GERD (gastroesophageal reflux disease)   ? Harsh voice quality   ? PT STATES NORMAL FOR HIM  ? High cholesterol   ? History of CHF (congestive heart failure) MONITORED BY DR Coletta Memos  ? DIASTOLIC  ? History of radiation therapy 03/13/19-03/20/19  ? SBRT Right Lung; Dr. Gery Pray  ? History of radiation therapy 02/17/20-02/28/20  ? Whole brain radiation; Dr. Gery Pray  ? History of radiation therapy 05/25/2020  ? 05/18/2020-05/25/2020  SBRT Right Lung; Dr. Gery Pray  ? Hydrocele, left   ? Hypertension   ? NSTEMI (non-ST elevated myocardial infarction) ([place]) 12/2015  ? Pre-operative clearance   ? GIVEN BY DR Coletta Memos  ? Short of breath on exertion   ? Smoker   ? Weakness of right side of body   ? S/P CVA   FEB  2013  ? ? ? ?Subjective:  ? ?This NP Osborne Oman reviewed medical records, received report from team, assessed the patient and then met at the patient's bedside with patient's son-Abdul and daughter, Judson Roch to discuss diagnosis, prognosis, GOC, EOL wishes disposition and options. Wife previously at bedside however left for self-care as she was emotional regarding patient's ongoing agitation and confusion.  ? ?When I initially arrived [name] appeared uncomfortable, agitated moving around in bed. Wife, RN, and IV team at the bedside attempting to establish a site. Now much calmer after receiving medications.  ?  ?Concept of Palliative Care was introduced as specialized medical care for people and their families living with serious illness.  It focuses on providing relief from the symptoms and stress of a serious illness.  The goal is to improve quality of life for both the patient and the family. Values and goals of care important to patient and family were attempted to be elicited. ? ?I created space and opportunity for patient and family to explore state of health prior to admission, thoughts, and feelings.  ? ?[name] lives in the home with his wife of more than 25 years. He has 4 children from previous marriage. 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 Is of Muslim faith.  ? ?Family  shares patient's health has significantly decline over the past 1-2 months. He was previously driving and attending Friday prayer however has not been able to do so. Unsteady gait and unable to ambulate independently. Son reports fall on last Wednesday with no major injuries. Appetite continues to be diminished. Family reports approximately 15-20 lb weight loss over the past months. Wife has been trying to encourage him to eat however did not seem to have much of an appetite.  ? ?We discussed His current illness and what it means in the larger context of His on-going co-morbidities. Natural disease trajectory and  expectations were discussed. Open and honest discussions around quality of life and long-term prognosis.  ? ?Marcy Siren verbalized understanding of current illness and co-morbidities. He is emotional expressing updates from the medical team. He and his stepmother have also been in conversations regarding his father's condition. They understand he has not been doing well for some time as patient has also expressed this prior to hospitalization.  ? ?Son is emotional sharing his father told him last week that he thought he was slowly dying and that it would not be long. He shares that was the hardest conversation to have and to hear his father express this to him. Emotional support provided.  ? ?He shares family are remaining hopeful for some improvement/stability but also preparing for this not to happen. We discussed at length best case and worst case scenario. Children state they are hopeful his mental state would improve and he can have an appropriate conversation with him and they can express their love and feelings.  ? ?They do not wish for him to suffer or be in pain. Family would like to continue to treat the treatable however if he continues to decline or shows no meaningful recovery would then consider focusing on his comfort.  ? ?I discussed the importance of continued conversation with family and their medical providers regarding overall plan of care and treatment options, ensuring decisions are within the context of the patients values and GOCs. ? ?Questions and concerns were addressed. The family was encouraged to call with questions or concerns.  PMT will continue to support holistically as needed. ? ? ?Objective:  ? ?Primary Diagnoses: ?Present on Admission: ? Enteritis ? Common bile duct dilatation ? AKI (acute kidney injury) ([place]) ? COPD (chronic obstructive pulmonary disease) ([place]) ? Primary lung small cell carcinoma, right ([place]) ? Abnormal head CT ? Coronary artery disease ? Hypertension associated with  diabetes ([place]) ? Hyperlipidemia associated with type 2 diabetes mellitus ([place]) ? ? ?Scheduled Meds: ? sodium chloride   Intravenous Once  ? sodium chloride   Intravenous Once  ? acetaminophen  650 mg Oral Once  ? arformoterol  15 mcg Nebulization BID  ? budesonide (PULMICORT) nebulizer solution  0.5 mg Nebulization BID  ? Chlorhexidine Gluconate Cloth  6 each Topical Daily  ? diphenhydrAMINE  25 mg Oral Once  ? DULoxetine  30 mg Oral QHS  ? furosemide  20 mg Intravenous Once  ? mouth rinse  15 mL Mouth Rinse BID  ? methylPREDNISolone (SOLU-MEDROL) injection  40 mg Intravenous Daily  ? nortriptyline  25 mg Oral QHS  ? QUEtiapine  25 mg Oral BID  ? revefenacin  175 mcg Nebulization Daily  ? sodium chloride flush  3 mL Intravenous Q12H  ? Tbo-Filgrastim  480 mcg Subcutaneous q1800  ? ? ?Continuous Infusions: ? sodium chloride 10 mL/hr (05/15/21 1244)  ? dexmedetomidine (PRECEDEX) IV infusion 1.2 mcg/kg/hr (05/16/21 0431)  ?  dextrose 5% lactated ringers 75 mL/hr at 05/16/21 0014  ? magnesium sulfate bolus IVPB Stopped (05/16/21 1023)  ? meropenem (MERREM) IV Stopped (05/16/21 0542)  ? ? ?PRN Meds: ?albuterol, HYDROmorphone (DILAUDID) injection, LORazepam, ondansetron **OR** ondansetron (ZOFRAN) IV ? ?Allergies  ?Allergen Reactions  ? Pork-Derived Products Other (See Comments)  ?  Muslim   ? Atorvastatin Other (See Comments)  ?  Severe constipation  ? ? ?Review of Systems  ?Unable to perform ROS: Acuity of condition  ? ?Physical Exam ?General: Uncomfortable, confused, agitated, frail, ill appearing ?Cardiovascular:irregular, tachycardic  ?Pulmonary:  diminished bilaterally  ?Extremities: no edema, no joint deformities ?Skin: no rashes, warm and dry ?Neurological: confused, unable to follow commands  ? ?Vital Signs:  ?BP 135/63   Pulse 87   Temp 99.4 ?F (37.4 ?C) (Axillary)   Resp 18   Ht 6' (1.829 m)   Wt 74.8 kg   SpO2 95%   BMI 22.38 kg/m?  ?Pain Scale: PAINAD ?  ?Pain Score: Asleep ? ?SpO2: SpO2: 95 % ?O2  Device:SpO2: 95 % ?O2 Flow Rate: .O2 Flow Rate (L/min): 4 L/min ? ?IO: Intake/output summary:  ?Intake/Output Summary (Last 24 hours) at 05/16/2021 1052 ?Last data filed at 05/16/2021 1024 ?Gross per 24 hour  ?Intake 1889

## 2021-05-17 ENCOUNTER — Ambulatory Visit: Payer: Medicare Other | Admitting: Cardiology

## 2021-05-17 ENCOUNTER — Inpatient Hospital Stay: Payer: Medicare Other

## 2021-05-17 DIAGNOSIS — D696 Thrombocytopenia, unspecified: Secondary | ICD-10-CM | POA: Diagnosis not present

## 2021-05-17 DIAGNOSIS — K529 Noninfective gastroenteritis and colitis, unspecified: Secondary | ICD-10-CM | POA: Diagnosis not present

## 2021-05-17 DIAGNOSIS — I878 Other specified disorders of veins: Secondary | ICD-10-CM

## 2021-05-17 DIAGNOSIS — E119 Type 2 diabetes mellitus without complications: Secondary | ICD-10-CM

## 2021-05-17 DIAGNOSIS — N179 Acute kidney failure, unspecified: Secondary | ICD-10-CM

## 2021-05-17 DIAGNOSIS — R410 Disorientation, unspecified: Secondary | ICD-10-CM

## 2021-05-17 DIAGNOSIS — C3491 Malignant neoplasm of unspecified part of right bronchus or lung: Secondary | ICD-10-CM | POA: Diagnosis not present

## 2021-05-17 LAB — BPAM RBC
Blood Product Expiration Date: 202305052359
Blood Product Expiration Date: 202305052359
Blood Product Expiration Date: 202305072359
Blood Product Expiration Date: 202305102359
Blood Product Expiration Date: 202305172359
ISSUE DATE / TIME: 202304290036
ISSUE DATE / TIME: 202304290644
ISSUE DATE / TIME: 202304292137
ISSUE DATE / TIME: 202304300222
ISSUE DATE / TIME: 202304300818
Unit Type and Rh: 7300
Unit Type and Rh: 7300
Unit Type and Rh: 7300
Unit Type and Rh: 7300
Unit Type and Rh: 7300

## 2021-05-17 LAB — TYPE AND SCREEN
ABO/RH(D): B POS
Antibody Screen: NEGATIVE
Unit division: 0
Unit division: 0
Unit division: 0
Unit division: 0
Unit division: 0

## 2021-05-17 LAB — COMPREHENSIVE METABOLIC PANEL
ALT: 31 U/L (ref 0–44)
AST: 36 U/L (ref 15–41)
Albumin: 2.9 g/dL — ABNORMAL LOW (ref 3.5–5.0)
Alkaline Phosphatase: 147 U/L — ABNORMAL HIGH (ref 38–126)
Anion gap: 9 (ref 5–15)
BUN: 30 mg/dL — ABNORMAL HIGH (ref 8–23)
CO2: 21 mmol/L — ABNORMAL LOW (ref 22–32)
Calcium: 7.8 mg/dL — ABNORMAL LOW (ref 8.9–10.3)
Chloride: 118 mmol/L — ABNORMAL HIGH (ref 98–111)
Creatinine, Ser: 1.39 mg/dL — ABNORMAL HIGH (ref 0.61–1.24)
GFR, Estimated: 54 mL/min — ABNORMAL LOW (ref >60)
Glucose, Bld: 159 mg/dL — ABNORMAL HIGH (ref 70–99)
Potassium: 4 mmol/L (ref 3.5–5.1)
Sodium: 148 mmol/L — ABNORMAL HIGH (ref 135–145)
Total Bilirubin: 1.5 mg/dL — ABNORMAL HIGH (ref 0.3–1.2)
Total Protein: 6.4 g/dL — ABNORMAL LOW (ref 6.5–8.1)

## 2021-05-17 LAB — BPAM PLATELET PHERESIS
Blood Product Expiration Date: 202304302359
ISSUE DATE / TIME: 202304290954
Unit Type and Rh: 6200

## 2021-05-17 LAB — CBC WITH DIFFERENTIAL/PLATELET
Abs Immature Granulocytes: 0.1 10*3/uL — ABNORMAL HIGH (ref 0.00–0.07)
Basophils Absolute: 0 10*3/uL (ref 0.0–0.1)
Basophils Relative: 1 %
Eosinophils Absolute: 0 10*3/uL (ref 0.0–0.5)
Eosinophils Relative: 0 %
HCT: 28.3 % — ABNORMAL LOW (ref 39.0–52.0)
Hemoglobin: 9.5 g/dL — ABNORMAL LOW (ref 13.0–17.0)
Immature Granulocytes: 2 %
Lymphocytes Relative: 8 %
Lymphs Abs: 0.4 10*3/uL — ABNORMAL LOW (ref 0.7–4.0)
MCH: 29.4 pg (ref 26.0–34.0)
MCHC: 33.6 g/dL (ref 30.0–36.0)
MCV: 87.6 fL (ref 80.0–100.0)
Monocytes Absolute: 1 10*3/uL (ref 0.1–1.0)
Monocytes Relative: 23 %
Neutro Abs: 2.8 10*3/uL (ref 1.7–7.7)
Neutrophils Relative %: 66 %
Platelets: 23 10*3/uL — CL (ref 150–400)
RBC: 3.23 MIL/uL — ABNORMAL LOW (ref 4.22–5.81)
RDW: 19.1 % — ABNORMAL HIGH (ref 11.5–15.5)
WBC: 4.3 10*3/uL (ref 4.0–10.5)
nRBC: 0 % (ref 0.0–0.2)

## 2021-05-17 LAB — PREPARE PLATELET PHERESIS: Unit division: 0

## 2021-05-17 LAB — MAGNESIUM: Magnesium: 1.5 mg/dL — ABNORMAL LOW (ref 1.7–2.4)

## 2021-05-17 LAB — LIPASE, BLOOD: Lipase: 25 U/L (ref 11–51)

## 2021-05-17 MED ORDER — FENTANYL CITRATE PF 50 MCG/ML IJ SOSY: 25.0000 ug | PREFILLED_SYRINGE | INTRAMUSCULAR | Status: DC | PRN

## 2021-05-17 MED ORDER — OLANZAPINE 10 MG IM SOLR
10.0000 mg | Freq: Once | INTRAMUSCULAR | Status: DC
Start: 2021-05-17 — End: 2021-05-17
  Filled 2021-05-17: qty 10

## 2021-05-17 MED ORDER — HYDROMORPHONE HCL 1 MG/ML IJ SOLN
1.0000 mg | Freq: Once | INTRAMUSCULAR | Status: DC
  Filled 2021-05-17: qty 1

## 2021-05-17 MED ORDER — INSULIN ASPART 100 UNIT/ML IJ SOLN: 0.0000 [IU] | Freq: Every day | INTRAMUSCULAR | Status: DC

## 2021-05-17 MED ORDER — MIDAZOLAM HCL 2 MG/2ML IJ SOLN
INTRAMUSCULAR | Status: AC
  Administered 2021-05-17: 4 mg via INTRAVENOUS
  Administered 2021-05-17: 2 mg
  Filled 2021-05-17: qty 4

## 2021-05-17 MED ORDER — HALOPERIDOL LACTATE 5 MG/ML IJ SOLN
2.0000 mg | Freq: Four times a day (QID) | INTRAMUSCULAR | Status: DC | PRN
  Administered 2021-05-17 (×2): 2 mg via INTRAMUSCULAR
  Filled 2021-05-17 (×2): qty 1

## 2021-05-17 MED ORDER — SODIUM CHLORIDE 0.9 % IV SOLN: 250.0000 mg | Freq: Every day | INTRAVENOUS | Status: DC

## 2021-05-17 MED ORDER — MORPHINE SULFATE (CONCENTRATE) 10 MG/0.5ML PO SOLN: 10.0000 mg | Freq: Once | ORAL | Status: DC

## 2021-05-17 MED ORDER — MORPHINE SULFATE (CONCENTRATE) 10 MG/0.5ML PO SOLN
5.0000 mg | Freq: Once | ORAL | Status: DC
  Filled 2021-05-17: qty 0.5

## 2021-05-17 MED ORDER — MIDAZOLAM-SODIUM CHLORIDE 100-0.9 MG/100ML-% IV SOLN
2.0000 mg/h | INTRAVENOUS | Status: DC
  Administered 2021-05-17: 2 mg/h via INTRAVENOUS
  Filled 2021-05-17: qty 100

## 2021-05-17 MED ORDER — HALOPERIDOL LACTATE 5 MG/ML IJ SOLN
4.0000 mg | Freq: Once | INTRAMUSCULAR | Status: AC
  Administered 2021-05-17: 4 mg via INTRAMUSCULAR
  Filled 2021-05-17: qty 1

## 2021-05-17 MED ORDER — THIAMINE HCL 100 MG/ML IJ SOLN
500.0000 mg | Freq: Three times a day (TID) | INTRAVENOUS | Status: DC
  Filled 2021-05-17 (×5): qty 5

## 2021-05-17 MED ORDER — INSULIN ASPART 100 UNIT/ML IJ SOLN: 0.0000 [IU] | Freq: Three times a day (TID) | INTRAMUSCULAR | Status: DC

## 2021-05-17 MED ORDER — FENTANYL 2500MCG IN NS 250ML (10MCG/ML) PREMIX INFUSION
0.0000 ug/h | INTRAVENOUS | Status: DC
  Administered 2021-05-17: 25 ug/h via INTRAVENOUS
  Administered 2021-05-18: 200 ug/h via INTRAVENOUS
  Filled 2021-05-17 (×2): qty 250

## 2021-05-17 MED ORDER — LORAZEPAM 2 MG/ML IJ SOLN
0.5000 mg | INTRAMUSCULAR | Status: DC | PRN
Start: 2021-05-17 — End: 2021-05-17

## 2021-05-17 MED ORDER — FENTANYL CITRATE PF 50 MCG/ML IJ SOSY
25.0000 ug | PREFILLED_SYRINGE | INTRAMUSCULAR | Status: DC | PRN
  Administered 2021-05-18: 50 ug via INTRAVENOUS
  Filled 2021-05-17: qty 1

## 2021-05-17 MED ORDER — MORPHINE SULFATE (PF) 2 MG/ML IV SOLN: 2.0000 mg | Freq: Once | INTRAVENOUS | Status: AC

## 2021-05-17 MED ORDER — MORPHINE SULFATE (PF) 2 MG/ML IV SOLN
2.0000 mg | Freq: Once | INTRAVENOUS | Status: AC
  Administered 2021-05-17: 2 mg via INTRAVENOUS
  Filled 2021-05-17: qty 1

## 2021-05-17 MED ORDER — GLYCOPYRROLATE 0.2 MG/ML IJ SOLN
0.3000 mg | INTRAMUSCULAR | Status: DC | PRN
  Administered 2021-05-18: 0.3 mg via INTRAVENOUS
  Filled 2021-05-17: qty 2

## 2021-05-17 MED ORDER — DEXTROSE IN LACTATED RINGERS 5 % IV SOLN: INTRAVENOUS | Status: DC

## 2021-05-17 MED ORDER — LIDOCAINE HCL 2 % IJ SOLN
INTRAMUSCULAR | Status: AC
  Administered 2021-05-17: 400 mg
  Filled 2021-05-17: qty 20

## 2021-05-17 MED ORDER — HYDROMORPHONE HCL 1 MG/ML IJ SOLN: 1.0000 mg | INTRAMUSCULAR | Status: DC | PRN

## 2021-05-17 MED ORDER — MAGNESIUM SULFATE 2 GM/50ML IV SOLN
2.0000 g | Freq: Once | INTRAVENOUS | Status: DC
  Filled 2021-05-17: qty 50

## 2021-05-17 MED ORDER — POLYVINYL ALCOHOL 1.4 % OP SOLN
1.0000 [drp] | Freq: Four times a day (QID) | OPHTHALMIC | Status: DC | PRN
  Filled 2021-05-17: qty 15

## 2021-05-17 MED ORDER — FENTANYL CITRATE PF 50 MCG/ML IJ SOSY
25.0000 ug | PREFILLED_SYRINGE | INTRAMUSCULAR | Status: DC | PRN
Start: 2021-05-17 — End: 2021-05-17
  Administered 2021-05-17 (×2): 25 ug via INTRAVENOUS
  Filled 2021-05-17 (×2): qty 1

## 2021-05-17 MED ORDER — ARFORMOTEROL TARTRATE 15 MCG/2ML IN NEBU: 15.0000 ug | INHALATION_SOLUTION | Freq: Two times a day (BID) | RESPIRATORY_TRACT | Status: DC | PRN

## 2021-05-17 MED ORDER — MORPHINE SULFATE (CONCENTRATE) 10 MG/0.5ML PO SOLN
10.0000 mg | Freq: Once | ORAL | Status: AC
  Administered 2021-05-17: 10 mg via SUBLINGUAL

## 2021-05-17 MED ORDER — MIDAZOLAM HCL 2 MG/2ML IJ SOLN
1.0000 mg | INTRAMUSCULAR | Status: DC | PRN
  Administered 2021-05-17: 2 mg via INTRAVENOUS
  Administered 2021-05-17: 4 mg via INTRAVENOUS
  Administered 2021-05-17: 2 mg via INTRAVENOUS
  Filled 2021-05-17: qty 2
  Filled 2021-05-17: qty 4
  Filled 2021-05-17: qty 2

## 2021-05-17 MED ORDER — MORPHINE SULFATE (PF) 2 MG/ML IV SOLN
INTRAVENOUS | Status: AC
  Administered 2021-05-17: 2 mg via INTRAVENOUS
  Filled 2021-05-17: qty 1

## 2021-05-17 MED ORDER — MIDAZOLAM HCL (PF) 5 MG/ML IJ SOLN: 1.0000 mg | INTRAMUSCULAR | Status: DC | PRN

## 2021-05-17 MED ORDER — FENTANYL CITRATE (PF) 100 MCG/2ML IJ SOLN
INTRAMUSCULAR | Status: AC
  Administered 2021-05-17: 100 ug
  Filled 2021-05-17: qty 2

## 2021-05-17 NOTE — TOC Initial Note (Signed)
Transition of Care (TOC) - Initial/Assessment Note  ?  ?Transition of Care (TOC) Screening Note ? ?Patient Details  ?Name: Jonathon [name] ?Date of Birth: [birth year] ? ?Transition of Care (TOC) CM/SW Contact:    ?Sherie Don, LCSW ?Phone Number: ?05/17/2021, 10:12 AM ? ?Transition of Care Department [facility]) has reviewed patient and no TOC needs have been identified at this time. We will continue to monitor patient advancement through interdisciplinary progression rounds. If new patient transition needs arise, please place a TOC consult. ? ?Expected Discharge Plan: Home/Self Care ?Barriers to Discharge: Continued Medical Work up ? ?Expected Discharge Plan and Services ?Expected Discharge Plan: Home/Self Care ?In-house Referral: Clinical Social Work ?Living arrangements for the past 2 months: [place] ? ?Prior Living Arrangements/Services ?Living arrangements for the past 2 months: [place] ?Lives with:: Spouse ?Patient language and need for interpreter reviewed:: Yes ?Need for Family Participation in Patient Care: Yes (Comment) ?Care giver support system in place?: Yes (comment) ?Criminal Activity/Legal Involvement Pertinent to Current Situation/Hospitalization: No - Comment as needed ? ?Activities of Daily Living ?Home Assistive Devices/Equipment: Cane (specify quad or straight), Walker (specify type), Wheelchair, Other (Comment) (liftchair) ?ADL Screening (condition at time of admission) ?Patient's cognitive ability adequate to safely complete daily activities?: No ?Is the patient deaf or have difficulty hearing?: No ?Does the patient have difficulty seeing, even when wearing glasses/contacts?: No ?Does the patient have difficulty concentrating, remembering, or making decisions?: Yes ?Patient able to express need for assistance with ADLs?: Yes ?Does the patient have difficulty dressing or bathing?: Yes ?Independently performs ADLs?: No ?Communication: Independent ?Dressing (OT): Needs  assistance ?Is this a change from baseline?: Pre-admission baseline ?Grooming: Needs assistance ?Is this a change from baseline?: Pre-admission baseline ?Feeding: Independent ?Bathing: Needs assistance ?Is this a change from baseline?: Change from baseline, expected to last >3 days ?Toileting: Needs assistance ?Is this a change from baseline?: Pre-admission baseline ?In/Out Bed: Needs assistance ?Is this a change from baseline?: Pre-admission baseline ?Walks in Home: Needs assistance ?Is this a change from baseline?: Pre-admission baseline ?Does the patient have difficulty walking or climbing stairs?: Yes ?Weakness of Legs: Both ?Weakness of Arms/Hands: Both ? ?Emotional Assessment ?Orientation: :  (Patient is disoriented x4.) ? ?Admission diagnosis:  Enteritis [K52.9] ?Thrombocytopenia ([place]) [D69.6] ?Chemotherapy-induced neutropenia ([place]) [D70.1, T45.1X5A] ?Pancytopenia ([place]) [D61.818] ?Anemia, unspecified type [D64.9] ?Diarrhea, unspecified type [R19.7] ?Patient Active Problem List  ? Diagnosis Date Noted  ? Abnormal LFTs   ? Diarrhea   ? Thrombocytopenia ([place])   ? Gallstones and inflammation of gallbladder without obstruction   ? Pancytopenia ([place]) 05/07/2021  ? Enteritis 04/30/2021  ? Common bile duct dilatation 05/11/2021  ? AKI (acute kidney injury) ([place]) 05/08/2021  ? Abnormal head CT 05/02/2021  ? Malignant neoplasm of unspecified part of unspecified bronchus or lung ([place]) 03/29/2021  ? Neutropenia ([place]) 03/29/2021  ? Pneumonia 03/21/2021  ? Acute respiratory failure with hypoxia ([place]) 03/21/2021  ? PNA (pneumonia) 03/20/2021  ? Hyperlipidemia associated with type 2 diabetes mellitus ([place]) 03/20/2021  ? Hyponatremia 03/20/2021  ? Constipation 03/20/2021  ? Encounter for antineoplastic immunotherapy 07/09/2020  ? Primary lung small cell carcinoma, right ([place]) 09/26/2019  ? Encounter for antineoplastic chemotherapy 09/26/2019  ? Goals of care, counseling/discussion 09/26/2019  ? Solitary pulmonary nodule  02/27/2019  ? CVA, old, hemiparesis ([place]) 02/27/2019  ? Diastolic dysfunction 97/94/8016  ? Aortic atherosclerosis ([place]) 02/11/2016  ? Chest pain 02/10/2016  ? Coronary artery disease 02/10/2016  ? COPD exacerbation ([place]) 02/10/2016  ?  Influenza A 02/10/2016  ? NSTEMI (non-ST elevated myocardial infarction) ([place]) 12/24/2015  ? Pain in joint, shoulder region 11/05/2013  ? Abnormal CT of the chest 10/09/2013  ? Chest wall pain 10/04/2013  ? Mid back pain 10/04/2013  ? Type 2 diabetes mellitus without complication, without long-term current use of insulin ([place]) 09/28/2013  ? Smoker   ? COPD (chronic obstructive pulmonary disease) ([place])   ? Diastolic heart failure ([place]) 04/16/2012  ? Atypical chest pain 04/30/2011  ? Alcohol intoxication ([place]) 04/30/2011  ? Hypertension associated with diabetes ([place]) 04/30/2011  ? History of Left Stroke, hemorrhagic 04/30/2011  ? Hypokalemia 04/30/2011  ? Pontine hemorrhage ([place]) 02/23/2011  ? ?PCP:  [name], MD ?Pharmacy:   ?[facility] ?[address] ?[place] [address] ?Phone: [phone] Fax: [phone] ? ?Readmission Risk Interventions ? ?  05/17/2021  ? 10:10 AM  ?Readmission Risk Prevention Plan  ?Transportation Screening Complete  ?Medication Review Press photographer) Complete  ?[place] or Home Care Consult Complete  ?SW Recovery Care/Counseling Consult Complete  ?Palliative Care Screening Complete  ?[place] Not Applicable  ? ?

## 2021-05-17 NOTE — Progress Notes (Signed)
? ?NAME:  Jonathon [name], MRN:  [id number], DOB:  1950-07-15, LOS: 3 ?ADMISSION DATE:  05/10/2021, CONSULTATION DATE:  05/15/21 ?REFERRING MD:  Florencia Reasons, MD CHIEF COMPLAINT:  AMS/Agitation ? ?History of Present Illness:  ?Jonathon [name] is a 71 year old male with extensive stage small cell lung cancer on lurbinectedin last treatment on 05/03/21 who was noted to be pancytopenic on 05/12/21 and had a decline in his overall functional status and develop diarrhea/poor oral intake. He was admitted on 04/19/2021 where CT head showed possible subacute infarct and MRI was recommended. CT abdomen/pelvis showed markedly distended gallbladder with intra hepatic biliary ductal dilatation along with possible enteritis of the bowel. US of the abdomen showed cholelithiasis without findings of acute cholecystitis and intrahepatic ductal dilatation. Blood and urine cultures remain negative to date. He has been started on meropenem. He has been transfused 1 unit PRBCs and given Neupogen for the neutropenia.  ? ?GI was consulted for the biliary dilatation and do not recommend any procedures at this time due to his blood counts. They have recommended checking MRCP. ? ?PCCM has been consulted for intermittent agitation that is hindering him from getting MRI brain and MRCP with request for precedex. ? ?Pertinent  Medical History  ?CHF ?COPD ?CVA ?DMII ?Small Cell Lung Cancer ?GERD ?Hypertension ? ?Significant Hospital Events: ?Including procedures, antibiotic start and stop dates in addition to other pertinent events   ?4/28 admitted ?4/29 PCCM consulted for precedex ?MRI did not show acute pathology ?MRCP inadequate due to patient agitation. Did show dilation of biliary tree but no overt stone. ? ?Interim History / Subjective:  ?Ongoing agitaiton overnight. Refractory to multiple doses of analgesics, anxiolytics, and anti-physchotic medications.  ? ?Objective   ?Blood pressure (!) 162/71, pulse (!) 111, temperature (!) 101.7 ?F (38.7  ?C), temperature source Axillary, resp. rate (!) 23, height 6' (1.829 m), weight 74.8 kg, SpO2 90 %. ?   ?   ? ?Intake/Output Summary (Last 24 hours) at 05/17/2021 0947 ?Last data filed at 05/17/2021 0800 ?Gross per 24 hour  ?Intake 2784.05 ml  ?Output 550 ml  ?Net 2234.05 ml  ? ? ?Filed Weights  ? 05/02/2021 1507  ?Weight: 74.8 kg  ? ? ?Examination: ?General: elderly male profound delirium writhing in bed ?HENT: [place]/AT, PERRL, no JVD ?Lungs: expiratory wheeze mild. Otherwise clear.  ?Cardiovascular: Tachycardic, regular, no MRG ?Abdomen: soft, non-distended, bowel sounds present ?Extremities: Warm, no edema.  ?Neuro: writhing in bed, profound delirium. Not alert or oriented at all.  ? ?Resolved Hospital Problem list   ? ? ?Assessment & Plan:  ? ?Acute encephalopathy: etiology uncertain. Could certainly be terminal delirium related to small progression of small cell lung cancer. Fever cannot rule out meningitis, but unable to obtain LP given current agitation. Patient would not want intubation per [place] review. Mild LFT elevation, ammonia 31. MRI brain negative.  ?- Multiple medications given to control delirium without effect. Poor IV access. Wonder if his only IV is working properly. Nurses were having some issues with it this morning.  ?- Giving SL morphine, IM haldol. Will try to get IM placed. ?- Precedex ongoing for RASS 0 ?- Trial of high dose thiamine 500mg  TID for 2 days then 250mg  daily for 5 days.  ?- If this represents terminal delirium there are limited options, first and foremost being comfort care.  ? ?Cholelithiasis and diffuse biliary tree dilation. No obvious stone identified on MRCP, but study limited by agitation.  ?Enteritis ?- Continue meropenem ?- IR  consulted. Planning for drain placement, however, thrombocytopenia and poor IV access are complicated this plan.  ? ?Small cell carcinoma of the R lung:  progression despite two chemotherapy regimens. Acitevly on third (Zepzelca) last dose  4/17. ?Pancytopenia: Likely related to chemotherapy.  ?- Oncology following ?- Unable to transfuse platelets due to lack of IV access. Need improvement in encephalopathy before any attempt at central access. Will try IO as above.  ? ?COPD with acute exacerbation ?- Continue ICS/LABA/LAMA nebulizer treatments ?- Solumedrol continue. Ongoing wheezing.  ?- Azithro added to meropenem.  ? ?Hypertenion: ?-holding home medications ? ?DMII ?- CBG monitoring and SSI ? ?Failure to thrive: progressive over the past 6 months.  ?- appreciate palliative care  ? ?Best Practice (right click and "Reselect all SmartList Selections" daily)  ? ?Per primary team ? ?Labs   ?CBC: ?Recent Labs  ?Lab 05/12/21 ?1413 05/12/21 ?1413 05/01/2021 ?1615 05/15/21 ?1427 05/16/21 ?0236 05/16/21 ?4235 05/16/21 ?1255 05/17/21 ?3614  ?WBC 0.3*   < > 0.4* 0.8* 1.1* 1.5* 2.0* 4.3  ?NEUTROABS 0.1*  --  0.1* 0.1* 0.4*  --   --  2.8  ?HGB 7.3*   < > 6.1* 5.9* 7.3* 8.6* 9.0* 9.5*  ?HCT 22.3*  --  18.4* 17.7* 22.7* 25.8* 26.8* 28.3*  ?MCV 83.2  --  85.6 85.5 87.3 87.5 85.9 87.6  ?PLT 16*   < > 9* 20* 20* 19* 18* 23*  ? < > = values in this interval not displayed.  ? ? ? ?Basic Metabolic Panel: ?Recent Labs  ?Lab 05/12/21 ?1413 04/19/2021 ?1615 05/15/21 ?1427 05/16/21 ?0236 05/17/21 ?4315  ?NA 133* 137 144 145 148*  ?K 3.3* 3.8 3.2* 4.0 4.0  ?CL 102 109 115* 119* 118*  ?CO2 18* 20* 20* 22 21*  ?GLUCOSE 121* 133* 120* 129* 159*  ?BUN 46* 44* 30* 27* 30*  ?CREATININE 2.43* 1.73* 1.13 1.05 1.39*  ?CALCIUM 7.9* 7.7* 7.7* 7.6* 7.8*  ?MG  --   --   --  1.6* 1.5*  ? ? ?GFR: ?Estimated Creatinine Clearance: 51.6 mL/min (A) (by C-G formula based on SCr of 1.39 mg/dL (H)). ?Recent Labs  ?Lab 04/30/2021 ?1615 05/15/21 ?1427 05/16/21 ?0236 05/16/21 ?4008 05/16/21 ?1255 05/17/21 ?6761  ?WBC 0.4*   < > 1.1* 1.5* 2.0* 4.3  ?LATICACIDVEN 0.8  --  1.0  --   --   --   ? < > = values in this interval not displayed.  ? ? ? ?Liver Function Tests: ?Recent Labs  ?Lab 05/12/21 ?1413  04/30/2021 ?1615 05/15/21 ?1427 05/16/21 ?0236 05/17/21 ?9509  ?AST 35 37 35 33 36  ?ALT 27 27 27 26 31   ?ALKPHOS 78 147* 133* 156* 147*  ?BILITOT 0.8 2.6* 2.1* 2.1* 1.5*  ?PROT 6.9 6.5 6.2* 6.1* 6.4*  ?ALBUMIN 3.1* 2.8* 2.8* 2.7* 2.9*  ? ? ?Recent Labs  ?Lab 05/17/21 ?3267  ?LIPASE 25  ? ?Recent Labs  ?Lab 05/16/21 ?0236  ?AMMONIA 31  ? ? ? ?ABG ?   ?Component Value Date/Time  ? PHART 7.36 05/16/2021 0918  ? PCO2ART 40 05/16/2021 0918  ? PO2ART 50 (L) 05/16/2021 1245  ? HCO3 22.5 05/16/2021 0918  ? TCO2 30 09/04/2012 1025  ? ACIDBASEDEF 2.5 (H) 05/16/2021 8099  ? O2SAT 86 05/16/2021 0918  ? ?  ? ?Coagulation Profile: ?Recent Labs  ?Lab 05/15/21 ?1427  ?INR 1.1  ? ? ? ?Cardiac Enzymes: ?No results for input(s): CKTOTAL, CKMB, CKMBINDEX, TROPONINI in the last 168 hours. ? ?HbA1C: ?Hgb A1c  MFr Bld  ?Date/Time Value Ref Range Status  ?03/20/2021 07:11 PM 6.6 (H) 4.8 - 5.6 % Final  ?  Comment:  ?  (NOTE) ?Pre diabetes:          5.7%-6.4% ? ?Diabetes:              >6.4% ? ?Glycemic control for   <7.0% ?adults with diabetes ?  ?09/28/2013 07:20 AM 6.7 (H) <5.7 % Final  ?  Comment:  ?  (NOTE) ?                                                                      ?According to the ADA Clinical Practice Recommendations for 2011, when ?HbA1c is used as a screening test: ? >=6.5%   Diagnostic of Diabetes Mellitus ?          (if abnormal result is confirmed) ?5.7-6.4%   Increased risk of developing Diabetes Mellitus ?References:Diagnosis and Classification of Diabetes Mellitus,Diabetes ?GMWN,0272,53(GUYQI 1):S62-S69 and Standards of Medical Care in         ?Diabetes - 2011,Diabetes Care,2011,34 (Suppl 1):S11-S61.  ? ? ?CBG: ?No results for input(s): GLUCAP in the last 168 hours. ? ? ?Critical care time: 60 minutes ?  ? ? ?Georgann Housekeeper, AGACNP-BC ?[facility] ? ?See Amion for personal pager ?PCCM on call pager [phone] until 7pm. ?Please call Elink 7p-7a. [phone] ? ?05/17/2021 10:48 AM ? ? ? ? ? ?  ?

## 2021-05-17 NOTE — Progress Notes (Signed)
?PROGRESS NOTE ? ?Jonathon [name]  ?DOB: [birth year]  ?PCP: [name], MD ?HTD:[id number]  ?DOA: 05/06/2021 ? LOS: 3 days  ?Hospital Day: 4 ? ?Brief narrative: ?Jonathon [name] is a 71 y.o. male with PMH significant for extensive stage small cell lung cancer, COPD on home oxygen, CAD s/p distal RCA DES, h/o brainstem pontine hemorrhage with right-sided weakness, DM2, HTN, HLD  ?Patient was brought to the ED on 05/15/2021 with complaint of altered mental status ?For his lung cancer, patient was on lurbinectedin, last treatment on 05/03/21.  He was noted to be pancytopenic on 05/12/21 and had a decline in his overall functional status and developed diarrhea/poor oral intake.  ?   ?On admission, CT head showed possible subacute infarct and MRI was recommended.  ?CT abdomen/pelvis showed markedly distended gallbladder with intra hepatic biliary ductal dilatation along with possible enteritis of the bowel.  ?US of the abdomen showed cholelithiasis without findings of acute cholecystitis and intrahepatic ductal dilatation.  ?Blood and urine cultures were sent.  Patient was started on IV meropenem. ?He was transfused 1 unit of PRBC and given Neupogen for the neutropenia.  ? ?GI was consulted for the biliary dilatation and did not recommend any procedures because of low platelet counts  ?IR was consulted for cholecystostomy tube placement. ?PCCM was consulted for intermittent agitation hindering him from getting MRI brain and MRCP ? ?Subjective: ?Patient was seen and examined this morning.  Awake, and agitated.  Not alert, unable to have a conversation or follow movements.  Agitated and responded to moving extremities.  Wife and daughter at bedside. ?Patient is maxed out on Precedex drip and is getting intermittent morphine or fentanyl.  Despite that, his agitation is difficult to control.  He is a DNR. ?PCCM, oncology and palliative care following. ? ?Principal Problem: ?  Enteritis ?Active Problems: ?  Pancytopenia  ([place]) ?  Common bile duct dilatation ?  AKI (acute kidney injury) ([place]) ?  COPD (chronic obstructive pulmonary disease) ([place]) ?  Primary lung small cell carcinoma, right ([place]) ?  Abnormal head CT ?  Type 2 diabetes mellitus without complication, without long-term current use of insulin ([place]) ?  Hypertension associated with diabetes ([place]) ?  Coronary artery disease ?  CVA, old, hemiparesis ([place]) ?  Hyperlipidemia associated with type 2 diabetes mellitus ([place]) ?  Diarrhea ?  Thrombocytopenia ([place]) ?  Gallstones and inflammation of gallbladder without obstruction ?  Abnormal LFTs ?  ? ? ?Assessment and Plan: ?Acute metabolic encephalopathy ?-Patient seems consistently agitated and altered, unable to have a conversation or follow commands.  He has maxed out on Precedex drip and is also requiring intermittent fentanyl or morphine with no effect.  Critical care following.  He is a DNR. ? ?Acute enteritis ?-Presented with 2 days of diarrhea.  CT abdomen showed changes of enteritis.   ?-GI following.  Currently on IV hydration.   ?-Also on empiric IV meropenem and azithromycin. ? ?Pancytopenia ?-Secondary to cancer and chemotherapy.  Given Neupogen.  Oncology following.  Last set of labs from this morning with improving WBC count, currently normal at 4.3, platelet count improving but is still in 20s.   ?-No active bleeding but hemoglobin was at the lowest of 5.9 on 4/29.  ?-So far patient has received 2 units of PRBC and 1 unit of platelets. ?Recent Labs  ?Lab 05/12/21 ?1413 05/12/21 ?1413 04/24/2021 ?1615 05/15/21 ?1427 05/16/21 ?0236 05/16/21 ?7262 05/16/21 ?1255 05/17/21 ?0355  ?WBC 0.3*   < > 0.4*  0.8* 1.1* 1.5* 2.0* 4.3  ?NEUTROABS 0.1*  --  0.1* 0.1* 0.4*  --   --  2.8  ?HGB 7.3*   < > 6.1* 5.9* 7.3* 8.6* 9.0* 9.5*  ?HCT 22.3*  --  18.4* 17.7* 22.7* 25.8* 26.8* 28.3*  ?MCV 83.2  --  85.6 85.5 87.3 87.5 85.9 87.6  ?PLT 16*   < > 9* 20* 20* 19* 18* 23*  ? < > = values in this interval not displayed.  ? ?Common bile duct  dilatation ?-CT imaging and RUQ ultrasound show CBD dilatation with intrahepatic ductal dilatation. Cholelithiasis noted without evidence of acute cholecystitis.   ?-MRCP showed cholelithiasis without cholecystitis.  Mild to moderate central intrahepatic ductal dilatation, dilated CBD measuring 1.6 cm but no choledocholithiasis. ?-Patient does have RUQ tenderness on exam with elevated alk phos and total bilirubin on admission. ?-On empiric meropenem as above ?-IR suggested HIDA scan before cholecystostomy tube placement.  Unable to do HIDA scan today because of agitation. ?-Trend liver enzymes ?Recent Labs  ?Lab 05/12/21 ?1413 04/21/2021 ?1615 05/15/21 ?1427 05/16/21 ?0236 05/17/21 ?6759  ?AST 35 37 35 33 36  ?ALT _0 ?ALKPHOS 78 147* 133* 156* 147*  ?BILITOT 0.8 2.6* 2.1* 2.1* 1.5*  ?PROT 6.9 6.5 6.2* 6.1* 6.4*  ?ALBUMIN 3.1* 2.8* 2.8* 2.7* 2.9*  ? ?AKI (acute kidney injury) ?-Secondary to hypovolemia from GI losses.  Metformin and losartan on hold.   ?-Creatinine initially improved with IV fluid but worsening again. ?Recent Labs  ?  04/05/21 ?0952 04/12/21 ?1347 04/20/21 ?1318 04/29/21 ?1537 05/03/21 ?0925 05/12/21 ?1413 05/07/2021 ?1615 05/15/21 ?1427 05/16/21 ?0236 05/17/21 ?1638  ?BUN _1 7* 8 46* 44* 30* 27* 30*  ?CREATININE 0.95 0.96 1.07 0.88 0.84 2.43* 1.73* 1.13 1.05 1.39*  ? ?COPD (chronic obstructive pulmonary disease) ([place]) ?Continue Dulera, albuterol as needed, supplemental oxygen as needed. ? ?Primary lung small cell carcinoma, right ([place]) ?-Follows with oncology, Dr. [name].  On active treatment with Zepzelca. ?-Oncology following. ? ?Hypertension ?-Currently on hold : losartan, amlodipine, clonidine, bisoprolol  ?-Blood pressure and heart rate are up because of agitation. ?-Once oral intake is ensured, will resume meds. ? ?Type 2 diabetes mellitus ?-A1c 6.6 on 03/20/2021 ?-Home meds include metformin. ?-Currently metformin on hold.  Continue sliding scale insulin with  Accu-Cheks. ?No results for input(s): GLUCAP in the last 168 hours. ? ?Hyperlipidemia ?-Continue atorvastatin. ? ?Coronary artery disease ?S/p PCI with DES to distal RCA December 2017.   ?-Currently denies any chest pain.  Continue atorvastatin.  Aspirin on hold because of thrombocytopenia. ? ?Abnormal head CT ?History of left CVA with right hemiparesis. ?-CT head shows possible subacute infarct at the anteromedial aspect of the left cerebellum. ?-MRI brain 4/29 did not show any evidence of acute or subacute infarct. ? ? ?Goals of care ?  Code Status: DNR  ? ? ?Mobility: Agitated at this time ? ?Skin assessment:  ?  ? ?Nutritional status:  ?Body mass index is 22.38 kg/m?Marland Kitchen  ?Nutrition Problem: Increased nutrient needs ?Etiology: cancer and cancer related treatments ?Signs/Symptoms: estimated needs ? ? ? ? ?Diet:  ?Diet Order   ? ?       ?  Diet clear liquid Room service appropriate? Yes; Fluid consistency: Thin  Diet effective now       ?  ? ?  ?  ? ?  ? ? ?DVT prophylaxis:  ?SCDs Start: 04/28/2021 2221 ?  ?Antimicrobials: IV meropenem and IV azithromycin ?Fluid: Currently  on D5 LR at 75 mill per hour ?Consultants: Oncology, critical care, palliative care ?Family Communication: Wife and daughter at bedside ? ?Status is: Inpatient ? ?Continue in-hospital care because: Agitated, poor prognosis ?Level of care: Stepdown  ? ?Dispo: The patient is from: Home ?             Anticipated d/c is to: Pending clinical course ?             Patient currently is not medically stable to d/c. ?  Difficult to place patient No ? ? ? ? ?Infusions:  ? azithromycin Stopped (05/16/21 1452)  ? dexmedetomidine (PRECEDEX) IV infusion 1.2 mcg/kg/hr (05/17/21 1006)  ? dextrose 5% lactated ringers 75 mL/hr at 05/17/21 0800  ? magnesium sulfate bolus IVPB    ? meropenem (MERREM) IV Stopped (05/17/21 0631)  ? thiamine injection    ? Followed by  ? [START ON 05/19/2021] thiamine injection    ? ? ?Scheduled Meds: ? fentaNYL      ? midazolam      ? sodium  chloride   Intravenous Once  ? sodium chloride   Intravenous Once  ? sodium chloride   Intravenous Once  ? arformoterol  15 mcg Nebulization BID  ? budesonide (PULMICORT) nebulizer solution  0.5 mg Nebulization BI

## 2021-05-17 NOTE — Procedures (Signed)
Central Venous Catheter Insertion Procedure Note ? ?Jonathon [name]  ?[id number]  ?23-Aug-1950 ? ?Date:05/17/21  ?Time:12:12 PM  ? ?Provider Performing:Gerre Ranum [place] Guiles  ? ?Procedure: Insertion of Non-tunneled Central Venous Catheter(36556) with US guidance (48270)  ? ?Indication(s) ?Medication administration and Difficult access ? ?Consent ?Risks of the procedure as well as the alternatives and risks of each were explained to the patient and/or caregiver.  Consent for the procedure was obtained and is signed in the bedside chart ? ?Anesthesia ?Topical only with 1% lidocaine  ? ?Timeout ?Verified patient identification, verified procedure, site/side was marked, verified correct patient position, special equipment/implants available, medications/allergies/relevant history reviewed, required imaging and test results available. ? ?Sterile Technique ?Maximal sterile technique including full sterile barrier drape, hand hygiene, sterile gown, sterile gloves, mask, hair covering, sterile ultrasound probe cover (if used). ? ?Procedure Description ?Area of catheter insertion was cleaned with chlorhexidine and draped in sterile fashion.  With real-time ultrasound guidance a central venous catheter was placed into the right femoral vein. Nonpulsatile blood flow and easy flushing noted in all ports.  The catheter was sutured in place and sterile dressing applied. ? ?Complications/Tolerance ?None; patient tolerated the procedure well. ?Chest X-ray is ordered to verify placement for internal jugular or subclavian cannulation.   Chest x-ray is not ordered for femoral cannulation. ? ?EBL ?Minimal ? ?Specimen(s) ?None  ? ? ?Jonathon [name], AGACNP-BC ?[facility] ? ?See Amion for personal pager ?PCCM on call pager [phone] until 7pm. ?Please call Elink 7p-7a. [phone] ? ?05/17/2021 12:13 PM ? ? ? ? ?

## 2021-05-17 NOTE — Progress Notes (Addendum)
HEMATOLOGY-ONCOLOGY PROGRESS NOTE  ASSESSMENT AND PLAN: Jonathon [name] is a 71 year old male with relapsed small cell lung cancer.  He was initially diagnosed as limited stage small cell lung cancer (T3, N2, M0).  He initially presented with pleural-based pulmonary nodules in the right lung in addition to mediastinal lymphadenopathy in August 2021.  He received SBRT in March 2021 but had disease recurrence and relapse in June 2022 with enlarging bilateral pulmonary nodules and enlarging right hilar lymphadenopathy that were biopsied and found to be small cell lung cancer.  He is currently receiving a third line, palliative systemic chemotherapy with Zepzelca and is status post 3 cycles.  He received his last cycle of chemotherapy on 05/03/2021.  Now admitted with encephalopathy and pancytopenia.  He has received Granix and his white blood cell count and ANC are normal today.  Will DC Granix.  He has received a total of 5 units PRBCs this admission.  His hemoglobin is up to 9.5 this morning and will hold off on any additional PRBC transfusion.  Recommend PRBC transfusion to keep hemoglobin above 8.  He has received 2 units of platelets this admission.  Platelet count is 23,000 this morning.  We will hold off on platelet transfusion.  Recommend platelet transfusion to keep platelets above 20,000 or for active bleeding.  The patient was due for a CT of the chest following his third cycle of chemotherapy.  We will go ahead and order this today if he can hold still long enough for the imaging to assess response to therapy.  If evidence of disease progression, will need to have further goals of care discussion and possible referral to hospice/palliative care.  Palliative care team is following.  The etiology of his encephalopathy is unclear.  CT of the head without contrast showed findings concerning for possible subacute infarct in the anteromedial aspect of the cerebellum.  However, MRI of the brain without  contrast showed no acute intracranial process and there was no evidence of acute or subacute infarct.  He is quite agitated and unclear if he is in pain at this time.  Pain medications are currently being adjusted so that we can continue additional work-up.  Terminal delirium also a consideration but do need to rule out other causes.  His abdominal ultrasound showed a dilated common bile duct with intrahepatic ductal dilatation which raises concern for a nonvisualized common duct stone.  MRCP recommended but patient unable to hold still long enough to complete this imaging.  We will consider trying again when he is calmer.  He is also noted to have cholelithiasis without associated sonographic findings to suggest acute cholecystitis.  Clenton Pare, DNP, AGPCNP-BC, AOCNP  SUBJECTIVE: Jonathon [name] has been receiving third line, palliative systemic chemotherapy with Zepzelca.  He is status post 3 cycles.  Last cycle was given on 05/03/2021.  At his recent visits with [place], he has been experiencing more fatigue and anorexia.  He had lost another 11 pounds at his last visit with [place].  Now admitted with acute metabolic encephalopathy of unclear etiology and was noted to have a distended gallbladder with cholelithiasis and possible enteritis on imaging.  He has been experiencing fevers up to 103.5.  He has been extremely agitated and is in restraints.  Unable to maintain IV access.  The patient was seen in his hospital room this morning.  Multiple family members at the bedside including his children, wife, and brother.  They are trying to keep him as calm as possible.  Discussed with nursing and no active bleeding reported.  Oncology History  Primary lung small cell carcinoma, right (HCC)  09/26/2019 Initial Diagnosis   Primary lung small cell carcinoma, right (HCC)    10/07/2019 - 12/13/2019 Chemotherapy          04/08/2020 Cancer Staging   Staging form: Lung, AJCC 8th Edition - Clinical: Stage IIIB  (cT3, cN2, cM0) - Signed by Si Gaul, MD on 04/08/2020    07/27/2020 - 02/08/2021 Chemotherapy   Patient is on Treatment Plan : LUNG SMALL CELL EXTENSIVE STAGE Durvalumab + Carboplatin D1 + Etoposide D1-3 q21d x 4 Cycles / Durvalumab q28d       03/16/2021 -  Chemotherapy   Patient is on Treatment Plan : LUNG SMALL CELL Lurbinectedin q21d          REVIEW OF SYSTEMS:   Review of Systems  Reason unable to perform ROS: Unable to obtain due to encephalopathy and agitation.  Obtained information from family and nursing.   I have reviewed the past medical history, past surgical history, social history and family history with the patient and they are unchanged from previous note.   PHYSICAL EXAMINATION: ECOG PERFORMANCE STATUS: 4 - Bedbound  Vitals:   05/17/21 0730 05/17/21 0834  BP:    Pulse:    Resp:    Temp: (!) 101.7 F (38.7 C)   SpO2:  90%   Filed Weights   May 15, 2021 1507  Weight: 74.8 kg    Intake/Output from previous day: 04/30 0701 - 05/01 0700 In: 1962.7 [I.V.:978.7; Blood:588; IV Piggyback:396] Out: 250 [Urine:250]  Physical Exam Vitals reviewed.  Constitutional:      Appearance: He is ill-appearing.     Comments: Agitated in bed. Very restless.   Cardiovascular:     Rate and Rhythm: Tachycardia present.  Pulmonary:     Comments: Tachypneic Neurological:     Mental Status: He is disoriented.    LABORATORY DATA:  I have reviewed the data as listed    Latest Ref Rng & Units 05/17/2021    7:52 AM 05/16/2021    2:36 AM 05/15/2021    2:27 PM  CMP  Glucose 70 - 99 mg/dL 308   657   846    BUN 8 - 23 mg/dL 30   27   30     Creatinine 0.61 - 1.24 mg/dL 9.62   9.52   8.41    Sodium 135 - 145 mmol/L 148   145   144    Potassium 3.5 - 5.1 mmol/L 4.0   4.0   3.2    Chloride 98 - 111 mmol/L 118   119   115    CO2 22 - 32 mmol/L 21   22   20     Calcium 8.9 - 10.3 mg/dL 7.8   7.6   7.7    Total Protein 6.5 - 8.1 g/dL 6.4   6.1   6.2    Total Bilirubin 0.3  - 1.2 mg/dL 1.5   2.1   2.1    Alkaline Phos 38 - 126 U/L 147   156   133    AST 15 - 41 U/L 36   33   35    ALT 0 - 44 U/L 31   26   27       Lab Results  Component Value Date   WBC 2.0 (L) 05/16/2021   HGB 9.0 (L) 05/16/2021   HCT 26.8 (L) 05/16/2021   MCV 85.9 05/16/2021  PLT 18 (LL) 05/16/2021   NEUTROABS 0.4 (LL) 05/16/2021    No results found for: CEA1, CEA, CAN199, CA125, PSA1  CT Head Wo Contrast  Result Date: 06/09/2021 CLINICAL DATA:  History of lung cancer presenting with altered mental status. EXAM: CT HEAD WITHOUT CONTRAST TECHNIQUE: Contiguous axial images were obtained from the base of the skull through the vertex without intravenous contrast. RADIATION DOSE REDUCTION: This exam was performed according to the departmental dose-optimization program which includes automated exposure control, adjustment of the mA and/or kV according to patient size and/or use of iterative reconstruction technique. COMPARISON:  Plain brain CT, dated August 29, 2011 and MR head dated April 21, 2021 FINDINGS: Brain: There is moderate severity cerebral atrophy with widening of the extra-axial spaces and ventricular dilatation. There are areas of decreased attenuation within the white matter tracts of the supratentorial brain, consistent with microvascular disease changes. Small bilateral chronic basal ganglia lacunar infarcts are noted. A chronic left frontal lobe infarct is seen. A 2.3 cm x 1.9 cm area of white matter low attenuation is seen along the anteromedial aspect of the cerebellum on the left (axial CT images 7 and 8, CT series 3). This is not clearly visualized on the prior MRI. There is no evidence of associated mass effect or midline shift. Vascular: No hyperdense vessel or unexpected calcification. Skull: Normal. Negative for fracture or focal lesion. Sinuses/Orbits: No acute finding. Other: None. IMPRESSION: 1. Findings along the anteromedial aspect of the cerebellum on the left, as described  above, which may represent sequelae associated with a subacute infarct. MRI correlation is recommended. 2. Chronic left frontal lobe infarct. 3. Generalized cerebral atrophy with chronic white matter small vessel ischemia. Electronically Signed   By: Aram Candela [name]   On: Jun 09, 2021 18:26   MR BRAIN WO CONTRAST  Result Date: 05/15/2021 CLINICAL DATA:  Altered mental status, history of lung cancer EXAM: MRI HEAD WITHOUT CONTRAST TECHNIQUE: Multiplanar, multiecho pulse sequences of the brain and surrounding structures were obtained without intravenous contrast. COMPARISON:  04/21/2021 FINDINGS: Brain: No restricted diffusion to suggest acute or subacute infarct. No acute hemorrhage, mass, mass effect, or midline shift. No hydrocephalus or extra-axial collection. Redemonstrated remote left frontal cortical and subcortical infarct. Confluent T2 hyperintense signal in the periventricular white matter and pons, likely the sequela of moderate to severe chronic small vessel ischemic disease. Unchanged hemosiderin deposition in the pons, likely sequela of hypertensive microhemorrhage. Advanced cerebral atrophy for age. Vascular: Normal flow voids. Skull and upper cervical spine: Normal marrow signal. Sinuses/Orbits: Minimal mucosal thickening in the ethmoid air cells. The orbits are unremarkable. Other: Fluid in the right-greater-than-left mastoid air cells. IMPRESSION: No acute intracranial process. No evidence of acute or subacute infarct. Electronically Signed   By: Wiliam Ke [name]   On: 05/15/2021 21:54   MR Brain W Wo Contrast  Result Date: 04/21/2021 CLINICAL DATA:  Small cell lung cancer.  Assess treatment response. EXAM: MRI HEAD WITHOUT AND WITH CONTRAST TECHNIQUE: Multiplanar, multiecho pulse sequences of the brain and surrounding structures were obtained without and with intravenous contrast. CONTRAST:  8mL GADAVIST GADOBUTROL 1 MMOL/ML IV SOLN COMPARISON:  01/04/2021 FINDINGS: Brain: Diffusion  imaging does not show any acute or subacute infarction or other cause of restricted diffusion. There are chronic small-vessel ischemic changes of the pons. Few old small vessel cerebellar infarctions. Cerebral hemispheres show generalized atrophy with chronic small-vessel ischemic changes affecting the white matter, moderate to marked in degree. There is an old left frontal cortical and  subcortical infarction. There is scattered foci of punctate hemosiderin deposition associated with some of the old small vessel insults. There is no evidence of metastatic disease. No hydrocephalus or extra-axial collection. No other cause of abnormal enhancement is identified. Vascular: Major vessels at the base of the brain show flow. Skull and upper cervical spine: Negative Sinuses/Orbits: Clear/normal Other: None IMPRESSION: No change since prior examinations. No evidence of metastatic disease. Old hemorrhagic infarction of the left pons and midbrain. Extensive chronic small-vessel ischemic changes affecting the cerebral hemispheric white matter. Old left frontal cortical and subcortical infarction. Electronically Signed   By: Paulina Fusi [name]   On: 04/21/2021 15:55   CT Abdomen Pelvis W Contrast  Result Date: 05/14/2021 CLINICAL DATA:  Abdominal pain, acute nonlocalized. Receiving chemotherapy and having acute diarrhea. EXAM: CT ABDOMEN AND PELVIS WITH CONTRAST TECHNIQUE: Multidetector CT imaging of the abdomen and pelvis was performed using the standard protocol following bolus administration of intravenous contrast. RADIATION DOSE REDUCTION: This exam was performed according to the departmental dose-optimization program which includes automated exposure control, adjustment of the mA and/or kV according to patient size and/or use of iterative reconstruction technique. CONTRAST:  80mL OMNIPAQUE IOHEXOL 300 MG/ML  SOLN COMPARISON:  None. FINDINGS: Lower chest: Emphysematous changes with large bibasilar emphysematous bulla.  Right basilar atelectasis. Hepatobiliary: No focal liver abnormality is seen. Gallbladder is distended. 1 cm calculus in the dependent gallbladder. Mild intrahepatic biliary ductal dilatation. There is also prominence of the common duct measuring approximately 1.3 cm at the level of the pancreatic head. Pancreas: Unremarkable. No pancreatic ductal dilatation or surrounding inflammatory changes. Spleen: Normal in size without focal abnormality. Adrenals/Urinary Tract: Adrenal glands are unremarkable. Stable simple cyst in the upper pole of the left kidney. No evidence of nephrolithiasis or hydronephrosis. No ureteral calculus. Bladder is unremarkable. Stomach/Bowel: Moderate-sized hiatal hernia. Stomach is within normal limits. Appendix not identified. Focal mildly dilated fluid-filled small bowel loops without significant wall enhancement. (Axial image 57; coronal image 46). Vascular/Lymphatic: Abdominal aorta is normal in caliber with advanced atherosclerotic calcifications and a tortuous course. No lymphadenopathy. Reproductive: Prostate is unremarkable. Other: No abdominal wall hernia or abnormality. No abdominopelvic ascites. Musculoskeletal: Mild degenerate disc disease of the lumbar spine. No acute osseous abnormality. IMPRESSION: 1. Cholelithiasis with markedly distended gallbladder and mild intrahepatic biliary ductal dilatation. The common duct is also prominent but tapers smoothly without appreciable calculus. Right upper quadrant sonogram could be obtained if there is clinical concern for acute cholecystitis. 2. Focal fluid-filled bowel loops which are normal in caliber, which may suggest enteritis. No evidence of bowel wall thickening or pneumatosis. 3.  Advanced emphysematous changes. 4.  Atherosclerotic disease of abdominal aorta and branch vessels. 5.  Additional chronic findings as above. Electronically Signed   By: Larose Hires D.O.   On: 05/14/2021 18:29   MR ABDOMEN MRCP WO CONTRAST  Result  Date: 05/15/2021 CLINICAL DATA:  Right upper quadrant pain, cholelithiasis, dilated CBD on ultrasound. EXAM: MRI ABDOMEN WITHOUT CONTRAST  (INCLUDING MRCP) TECHNIQUE: Multiplanar multisequence MR imaging of the abdomen was performed. Heavily T2-weighted images of the biliary and pancreatic ducts were obtained, and three-dimensional MRCP images were rendered by post processing. COMPARISON:  Right upper quadrant ultrasound dated 05/14/2021. CT abdomen/pelvis dated 05/14/2021. FINDINGS: Study prematurely aborted following 2 sequences (axial T2 and diffusion/ADC). Patient could not tolerate additional imaging. Lower chest: Mild patchy bilateral lower lobe opacities, atelectasis versus pneumonia. Right posterior Bochdalek's hernia. Hepatobiliary: No focal hepatic lesion. Distended gallbladder. Layering 13 mm gallstone (series  4/image 24). No pericolonic inflammatory changes. Mild to moderate central intrahepatic ductal dilatation, similar to the prior. Dilated common duct, measuring 16 mm, and smoothly tapering at the ampulla. No choledocholithiasis is seen on this single axial T2. Pancreas:  Within normal limits. Spleen:  Within normal limits. Adrenals/Urinary Tract:  Adrenal glands are within normal limits. Bilateral renal cysts, measuring up to 3.0 cm in the left upper kidney. No hydronephrosis. Stomach/Bowel: Stomach and visualized bowel are grossly unremarkable. Vascular/Lymphatic:  No evidence of abdominal aortic aneurysm. No suspicious abdominal lymphadenopathy. Other:  No abdominal ascites. Musculoskeletal: No focal osseous lesions. IMPRESSION: Markedly limited evaluation. Study prematurely aborted after 2 sequences. Patient could not tolerate additional imaging. Cholelithiasis, without associated inflammatory changes to suggest acute cholecystitis. Mild to moderate central intrahepatic ductal dilatation. Dilated common duct, measuring 16 mm, and smoothly tapering at the ampulla. However, no choledocholithiasis  is seen. Additional ancillary findings as above. Electronically Signed   By: Charline Bills [name]   On: 05/15/2021 21:08   US Abdomen Limited  Result Date: 05/14/2021 CLINICAL DATA:  Cholelithiasis EXAM: ULTRASOUND ABDOMEN LIMITED RIGHT UPPER QUADRANT COMPARISON:  CT abdomen/pelvis dated 05/06/2021 FINDINGS: Gallbladder: Distended gallbladder with 60 mm gallstone. No gallbladder wall thickening or pericholecystic fluid. Negative sonographic Murphy's sign. Common bile duct: Diameter: 15 mm. Associated intrahepatic ductal dilatation. This appearance is new from February 2023, suggesting a nonvisualized common duct stone. Liver: No focal lesion identified. Within normal limits in parenchymal echogenicity. Portal vein is patent on color Doppler imaging with normal direction of blood flow towards the liver. Other: None. IMPRESSION: Dilated common duct with intrahepatic ductal dilatation. This appearance is new from February 2023, raising concern for a nonvisualized common duct stone. Consider MRCP or ERCP for further evaluation. Cholelithiasis, without associated sonographic findings to suggest acute cholecystitis. Electronically Signed   By: Charline Bills [name]   On: 05/14/2021 19:58   DG Chest Port 1 View  Result Date: 05/14/2021 CLINICAL DATA:  Weakness.  History of lung cancer EXAM: PORTABLE CHEST 1 VIEW COMPARISON:  03/20/2021, 03/04/2021 FINDINGS: Stable cardiomediastinal contours. Advanced bullous emphysema with upper lobe predominance. Hazy opacities within the mid to lower lung fields with scattered areas of nodularity. No pleural effusion. No evidence of pneumothorax. IMPRESSION: Similar radiographic appearance of the lungs with hazy opacities within the mid to lower lung fields with scattered areas of nodularity. Findings may represent a combination of atypical/viral infection and pulmonary metastatic disease. Electronically Signed   By: Duanne Guess D.O.   On: 05/14/2021 17:04   DG Finger  Little Left  Result Date: 04/23/2021 CLINICAL DATA:  Status post trauma. EXAM: LEFT LITTLE FINGER 2+V COMPARISON:  None. FINDINGS: There is no evidence of fracture or dislocation. Degenerative changes seen involving the interphalangeal joints. Soft tissues are unremarkable. IMPRESSION: 1. No acute fracture or dislocation. 2. Degenerative changes. Electronically Signed   By: Aram Candela [name]   On: 04/23/2021 20:30     Future Appointments  Date Time Provider Department Center  05/17/2021  1:00 PM [facility] None  05/17/2021  2:15 PM [facility] FLUSH [facility] None  05/17/2021  3:00 PM Yates Decamp, MD [facility] None  05/24/2021  8:45 AM [facility] None  05/24/2021  9:15 AM Si Gaul, MD [facility] None  05/24/2021 10:00 AM [facility] INFUSION [facility] None  05/24/2021 10:30 AM Alphonse Guild, RD [facility] None  05/31/2021  1:00 PM [facility] None  06/07/2021  1:00 PM [facility] None  06/15/2021  9:30 AM  [facility] None  06/15/2021 10:00 AM Heilingoetter, Cassandra L, PA-C [facility] None  06/15/2021 11:30 AM [facility] INFUSION [facility] None  06/15/2021 12:00 PM Noreene Larsson, RD [facility] None  07/05/2021 10:15 AM [facility] None  07/05/2021 10:45 AM Si Gaul, MD [facility] None  07/05/2021 11:30 AM [facility] INFUSION [facility] None  08/02/2021  3:00 PM Antony Blackbird, MD [facility] None    ADDENDUM: Hematology/Oncology Attending: The patient is seen and examined today.  I agree with the above note.  I also had a lengthy discussion with the family today. This is a very pleasant 71 years old white male with relapsed small cell lung cancer that was initially diagnosed as limited stage disease in August 2021 status post SBRT to the right lower lobe pulmonary nodule.  The patient also had initial systemic chemotherapy with carboplatin and etoposide for 4 cycles followed  by observation.  He had evidence for disease progression few months later and the patient is started second line systemic chemotherapy with carboplatin, etoposide as well as Imfinzi for a total of 9 cycles before it was discontinued secondary to disease progression. He started third line systemic chemotherapy with Zepzelca, Lurbinectedin status post 3 cycles and has been tolerating the treatment well except for significant pancytopenia.  He was admitted to the hospital few days ago complaining of encephalopathy as well as pancytopenia and shortness of breath.  He received Granix because of the neutropenia and his total white blood count has improved.  He also received 5 units of PRBCs transfusion since his admission and his hemoglobin is up to 9.5.  He continues to have significant thrombocytopenia with platelets of 23,000.  The patient has been agitated and combative and poorly responsive. I had a lengthy discussion with the family today including his wife Marylu Lund, brother, Artis Flock, son Rennis Chris and daughter Maralyn Sago about his current condition. I explained to them that his condition had declined significantly and he may not be a candidate for any additional treatment in the future.  I also know from previous encounter with the patient that he would not like to extend his life with no quality.  I have a doubt that the patient would make it out of the hospital this admission and he is likely will die before being discharged home. After the discussion the family opted for comfort care at this point.  They are all in agreement with the plan.  I discussed the plan with Dr. Marchelle Gearing from the critical care team. Thank you so much for taking good care of Mr. Harwell.  Disclaimer: This note was dictated with voice recognition software. Similar sounding words can inadvertently be transcribed and may be missed upon review. Lajuana Matte, MD     LOS: 3 days

## 2021-05-17 NOTE — Progress Notes (Signed)
Unable to use 2nd bag of platelets. Pt IV infiltrated again. Bag [id number] wasted. 309 ml. MD notified. See MAR for orders.  ?

## 2021-05-17 NOTE — Progress Notes (Signed)
? ?Palliative Medicine Inpatient Follow Up Note ? ? ? ?Chart Reviewed. Patient assessed at the bedside.  ? ?Jonathon [name] is resting comfortably in bed. Remains in restraints due to ongoing agitation and confusion. His family is at the bedside, wife, son, daughter, and brother.  ? ?We discussed at length patient's continued health decline with no meaningful recovery. Family is emotional expressing their understanding despite previous hopes for some improvement. Emotional support provided. They have discussed his poor prognosis with the hospital medical team and Dr. Julien Nordmann. Wife and son expresses understanding that comfort focused care are their wishes and to eliminate any suffering or distress. They are appreciative of his calming restful state currently.  ? ?Extensive education provided on comfort focused care and what this would look like for Jonathon [name] and family. They understand we will continue to administer medications with a goal of comfort and ability to remove him out of restraints. Education provided on all medical interventions including labs, procedures, frequent vital signs, and certain medications not comfort focused will be discontinued. They verbalized understanding and appreciation. I discussed expectations as he approaches end-of-life and possible physical changes they may observed. Wife verbalized understanding expressing again she does not want him to suffer. They were hopeful to have an opportunity to have conversations with him again but aware this most likely will not happen. I did encourage them although he may not be able to communicate effectively with them to continue to express their feelings and love to him. Family verbalized understanding and appreciation.  ? ?They are aware patient could potentially be moved up to a different floor in the setting of comfort pending he is able to maintain a comfort state and not require extensive medications. Education provided if a transfer is  expected they would be kept informed.  ? ?All questions answered and emotional support provided. During my visit the family's Spiritual leader arrived to visit and offer Islamic support. Acknowledged his presence and importance.  ? ?All questions answered and family verbalized appreciation of ongoing support and care. They have my information and encouraged to call if needed or notify nursing staff.  ? ?8786: Patient continues to have intermittent agitation despite maximum fentanyl and precedex dosing. He is receiving Versed 4mg  every 2 hours or 2mg  every hour for ongoing agitation related to terminal delirium in the setting of end-of-life. Giving frequent need for Versed and discomfort will initiate Versed infusion with titration for comfort.  ? ?Objective Assessment: ?Vital Signs ?Vitals:  ? 05/17/21 0834 05/17/21 1428  ?BP:    ?Pulse:  94  ?Resp:  20  ?Temp:  (!) 100.8 ?F (38.2 ?C)  ?SpO2: 90% 98%  ? ? ?Intake/Output Summary (Last 24 hours) at 05/17/2021 1613 ?Last data filed at 05/17/2021 1400 ?Gross per 24 hour  ?Intake 2007.64 ml  ?Output 550 ml  ?Net 1457.64 ml  ? ?Last Weight  Most recent update: 05/09/2021  3:08 PM  ? ? Weight  ?74.8 kg (165 lb)  ?      ? ?  ? ?Gen: Critically-ill, frail  ?CV: Tachycardic  ?PULM: diminished bilaterally, wheeze  ?ABD: soft/nontender/nondistended/normal bowel sounds ?EXT: anasarca, scattered ecchymosis ?Neuro: currently resting however with ongoing agitation ? ?SUMMARY OF RECOMMENDATIONS   ?DNR/DNI ?Family is clear in expressed wishes to transition all care to focus on comfort. Education provided on terminal delirium, comfort focused care, and anticipated hospital death.  ?Discontinue all interventions, treatments, medications not comfort focused.  ?Fentanyl infusion with available boluses via infusion ?Dilaudid PRN for  pain/air hunger/comfort if no relief with fentanyl ?Robinul PRN for excessive secretions ?Precedex infusion for agitation ?Versed PRN for agitation/anxiety. Versed  infusion 2mg  may titrate to max of 8mg  for comfort/agitation ?Zofran PRN for nausea ?Liquifilm tears PRN for dry eyes ?Haldol PRN for agitation/anxiety ?Comfort cart for family ?Unrestricted visitations in the setting of EOL (per policy) ?Oxygen PRN 2L or less for comfort. No escalation.   ?PMT will continue to support and follow as needed basis. Please secure chat for urgent needs.  ? ?Time Total: 90 min.  ? ?Visit consisted of counseling and education dealing with the complex and emotionally intense issues of symptom management and palliative care in the setting of serious and potentially life-threatening illness.Greater than 50%  of this time was spent counseling and coordinating care related to the above assessment and plan. ? ?Alda Lea, AGPCNP-BC  ?[place]  ?[phone] or [phone] ? ?Palliative Medicine Team providers are available by phone from 7am to 7pm daily and can be reached through the team cell phone. Should this patient require assistance outside of these hours, please call the patient's attending physician.  ?

## 2021-05-17 DEATH — deceased

## 2021-05-18 ENCOUNTER — Ambulatory Visit: Payer: Medicare Other

## 2021-05-18 ENCOUNTER — Ambulatory Visit: Payer: Medicare Other | Admitting: Internal Medicine

## 2021-05-18 ENCOUNTER — Encounter: Payer: Medicare Other | Admitting: Dietician

## 2021-05-18 ENCOUNTER — Other Ambulatory Visit: Payer: Medicare Other

## 2021-05-18 LAB — PREPARE PLATELET PHERESIS
Unit division: 0
Unit division: 0
Unit division: 0

## 2021-05-18 LAB — BPAM PLATELET PHERESIS
Blood Product Expiration Date: 202305022359
Blood Product Expiration Date: 202305032359
Blood Product Expiration Date: 202305032359
ISSUE DATE / TIME: 202305010030
ISSUE DATE / TIME: 202305010501
Unit Type and Rh: 5100
Unit Type and Rh: 5100
Unit Type and Rh: 7300

## 2021-05-19 LAB — CULTURE, BLOOD (ROUTINE X 2)
Culture: NO GROWTH
Special Requests: ADEQUATE

## 2021-05-19 LAB — CBC
HCT: 25.8 % — ABNORMAL LOW (ref 39.0–52.0)
Hemoglobin: 8.6 g/dL — ABNORMAL LOW (ref 13.0–17.0)
MCH: 29.2 pg (ref 26.0–34.0)
MCHC: 33.3 g/dL (ref 30.0–36.0)
MCV: 87.5 fL (ref 80.0–100.0)
Platelets: 19 10*3/uL — CL (ref 150–400)
RBC: 2.95 MIL/uL — ABNORMAL LOW (ref 4.22–5.81)
RDW: 18.8 % — ABNORMAL HIGH (ref 11.5–15.5)
WBC: 1.5 10*3/uL — ABNORMAL LOW (ref 4.0–10.5)
nRBC: 0 % (ref 0.0–0.2)

## 2021-05-22 ENCOUNTER — Other Ambulatory Visit (HOSPITAL_COMMUNITY): Payer: Self-pay

## 2021-05-24 ENCOUNTER — Inpatient Hospital Stay: Payer: Medicare Other

## 2021-05-24 ENCOUNTER — Other Ambulatory Visit: Payer: Medicare Other

## 2021-05-24 ENCOUNTER — Inpatient Hospital Stay: Payer: Medicare Other | Admitting: Internal Medicine

## 2021-05-31 ENCOUNTER — Ambulatory Visit: Payer: Medicare Other

## 2021-05-31 ENCOUNTER — Other Ambulatory Visit: Payer: Medicare Other

## 2021-05-31 ENCOUNTER — Ambulatory Visit: Payer: Medicare Other | Admitting: Internal Medicine

## 2021-05-31 ENCOUNTER — Inpatient Hospital Stay: Payer: Medicare Other

## 2021-06-07 ENCOUNTER — Ambulatory Visit: Payer: Medicare Other | Admitting: Physician Assistant

## 2021-06-07 ENCOUNTER — Other Ambulatory Visit: Payer: Medicare Other

## 2021-06-07 ENCOUNTER — Ambulatory Visit: Payer: Medicare Other

## 2021-06-15 ENCOUNTER — Encounter: Payer: Medicare Other | Admitting: Dietician

## 2021-06-15 ENCOUNTER — Ambulatory Visit: Payer: Medicare Other | Admitting: Physician Assistant

## 2021-06-15 ENCOUNTER — Other Ambulatory Visit: Payer: Medicare Other

## 2021-06-15 ENCOUNTER — Ambulatory Visit: Payer: Medicare Other

## 2021-06-17 NOTE — Accreditation Note (Signed)
Restraints reported to CMS  ?Pursuant to regulation 482.13 (G) (3) use of restraints was logged and CMS was notified via electronic portal on 05/20/2021. ?

## 2021-06-17 NOTE — Death Summary Note (Signed)
? ?DEATH SUMMARY  ? ?Patient Details  ?Name: Jonathon [name] ?MRN: [id number] ?DOB: November 04, 1950 ?[id number], Shanon Brow, MD ?Admission/Discharge Information  ? ?Admit Date:  05-23-21  ?Date of Death: Date of Death: 05-27-2021  ?Time of Death: Time of Death: 0916  ?Length of Stay: 4  ? ?Principle Cause of death: Acute metabolic encephalopathy related to advanced lung cancer. ? ?Hospital Diagnoses: ?Principal Problem: ?  Enteritis ?Active Problems: ?  Pancytopenia ([place]) ?  Common bile duct dilatation ?  AKI (acute kidney injury) ([place]) ?  COPD (chronic obstructive pulmonary disease) ([place]) ?  Primary lung small cell carcinoma, right ([place]) ?  Abnormal head CT ?  Type 2 diabetes mellitus without complication, without long-term current use of insulin ([place]) ?  Hypertension associated with diabetes ([place]) ?  Coronary artery disease ?  CVA, old, hemiparesis ([place]) ?  Hyperlipidemia associated with type 2 diabetes mellitus ([place]) ?  Diarrhea ?  Thrombocytopenia ([place]) ?  Gallstones and inflammation of gallbladder without obstruction ?  Abnormal LFTs ?  Delirium ?  Poor venous access ? ? ?Hospital Course: ?Jonathon [name] is a 71 y.o. male with PMH significant for extensive stage small cell lung cancer, COPD on home oxygen, CAD s/p distal RCA DES, h/o brainstem pontine hemorrhage with right-sided weakness, DM2, HTN, HLD  ?Patient was brought to the ED on 23-May-2021 with complaint of altered mental status ?For his lung cancer, patient was on lurbinectedin, last treatment on 05/03/21.  He was noted to be pancytopenic on 05/12/21 and had a decline in his overall functional status and developed diarrhea/poor oral intake.  ?   ?On admission, CT head showed possible subacute infarct and MRI was recommended.  ?CT abdomen/pelvis showed markedly distended gallbladder with intra hepatic biliary ductal dilatation along with possible enteritis of the bowel.  ?US of the abdomen showed cholelithiasis without findings of acute cholecystitis and  intrahepatic ductal dilatation.  ?Blood and urine cultures were sent.  Patient was started on IV meropenem. ?He was transfused 1 unit of PRBC and given Neupogen for the neutropenia.  ?  ?GI was consulted for the biliary dilatation and did not recommend any procedures because of low platelet counts  ?IR was consulted for cholecystostomy tube placement. ? ?However patient's mental status continued to get worse.  He was transferred to ICU and placed on Precedex drip as well as fentanyl despite which his agitation remained difficult to control ?Palliative care was consulted.  After discussion with palliative care, critical care and oncology, family chose comfort care. ? ?Patient expired today 05-27-2021 at 9:16 AM.  Family was at bedside. ?  ?Acute issues addressed in the hospital were ?Acute metabolic encephalopathy ?Acute enteritis ?Pancytopenia ?Common bile duct dilatation ?AKI (acute kidney injury) ?COPD (chronic obstructive pulmonary disease) ?Primary lung small cell carcinoma, right ?Hypertension ?Type 2 diabetes mellitus ?Hyperlipidemia ?Coronary artery disease ?Abnormal head CT ?History of left CVA with right hemiparesis. ? ?  ? ? ?Procedures:  ? ?Consultations:  ? ?The results of significant diagnostics from this hospitalization (including imaging, microbiology, ancillary and laboratory) are listed below for reference.  ? ?Significant Diagnostic Studies: ?CT Head Wo Contrast ? ?Result Date: 05/23/2021 ?CLINICAL DATA:  History of lung cancer presenting with altered mental status. EXAM: CT HEAD WITHOUT CONTRAST TECHNIQUE: Contiguous axial images were obtained from the base of the skull through the vertex without intravenous contrast. RADIATION DOSE REDUCTION: This exam was performed according to the departmental dose-optimization program which includes automated exposure control, adjustment of the mA and/or kV  according to patient size and/or use of iterative reconstruction technique. COMPARISON:  Plain brain CT,  dated August 29, 2011 and MR head dated April 21, 2021 FINDINGS: Brain: There is moderate severity cerebral atrophy with widening of the extra-axial spaces and ventricular dilatation. There are areas of decreased attenuation within the white matter tracts of the supratentorial brain, consistent with microvascular disease changes. Small bilateral chronic basal ganglia lacunar infarcts are noted. A chronic left frontal lobe infarct is seen. A 2.3 cm x 1.9 cm area of white matter low attenuation is seen along the anteromedial aspect of the cerebellum on the left (axial CT images 7 and 8, CT series 3). This is not clearly visualized on the prior MRI. There is no evidence of associated mass effect or midline shift. Vascular: No hyperdense vessel or unexpected calcification. Skull: Normal. Negative for fracture or focal lesion. Sinuses/Orbits: No acute finding. Other: None. IMPRESSION: 1. Findings along the anteromedial aspect of the cerebellum on the left, as described above, which may represent sequelae associated with a subacute infarct. MRI correlation is recommended. 2. Chronic left frontal lobe infarct. 3. Generalized cerebral atrophy with chronic white matter small vessel ischemia. Electronically Signed   By: Virgina Norfolk [name]   On: 04/17/2021 18:26  ? ?MR BRAIN WO CONTRAST ? ?Result Date: 05/15/2021 ?CLINICAL DATA:  Altered mental status, history of lung cancer EXAM: MRI HEAD WITHOUT CONTRAST TECHNIQUE: Multiplanar, multiecho pulse sequences of the brain and surrounding structures were obtained without intravenous contrast. COMPARISON:  04/21/2021 FINDINGS: Brain: No restricted diffusion to suggest acute or subacute infarct. No acute hemorrhage, mass, mass effect, or midline shift. No hydrocephalus or extra-axial collection. Redemonstrated remote left frontal cortical and subcortical infarct. Confluent T2 hyperintense signal in the periventricular white matter and pons, likely the sequela of moderate to severe  chronic small vessel ischemic disease. Unchanged hemosiderin deposition in the pons, likely sequela of hypertensive microhemorrhage. Advanced cerebral atrophy for age. Vascular: Normal flow voids. Skull and upper cervical spine: Normal marrow signal. Sinuses/Orbits: Minimal mucosal thickening in the ethmoid air cells. The orbits are unremarkable. Other: Fluid in the right-greater-than-left mastoid air cells. IMPRESSION: No acute intracranial process. No evidence of acute or subacute infarct. Electronically Signed   By: Merilyn Baba [name]   On: 05/15/2021 21:54  ? ?MR Brain W Wo Contrast ? ?Result Date: 04/21/2021 ?CLINICAL DATA:  Small cell lung cancer.  Assess treatment response. EXAM: MRI HEAD WITHOUT AND WITH CONTRAST TECHNIQUE: Multiplanar, multiecho pulse sequences of the brain and surrounding structures were obtained without and with intravenous contrast. CONTRAST:  76mL GADAVIST GADOBUTROL 1 MMOL/ML IV SOLN COMPARISON:  01/04/2021 FINDINGS: Brain: Diffusion imaging does not show any acute or subacute infarction or other cause of restricted diffusion. There are chronic small-vessel ischemic changes of the pons. Few old small vessel cerebellar infarctions. Cerebral hemispheres show generalized atrophy with chronic small-vessel ischemic changes affecting the white matter, moderate to marked in degree. There is an old left frontal cortical and subcortical infarction. There is scattered foci of punctate hemosiderin deposition associated with some of the old small vessel insults. There is no evidence of metastatic disease. No hydrocephalus or extra-axial collection. No other cause of abnormal enhancement is identified. Vascular: Major vessels at the base of the brain show flow. Skull and upper cervical spine: Negative Sinuses/Orbits: Clear/normal Other: None IMPRESSION: No change since prior examinations. No evidence of metastatic disease. Old hemorrhagic infarction of the left pons and midbrain. Extensive chronic  small-vessel ischemic changes affecting the cerebral hemispheric  white matter. Old left frontal cortical and subcortical infarction. Electronically Signed   By: Nelson Chimes [name]   On: 04/21/2021 15:55  ? ?CT

## 2021-06-17 NOTE — Progress Notes (Signed)
Nutrition Brief Note ? ?Full initial RD assessment completed remotely on 4/29. Chart reviewed. Palliative Care note from yesterday indicates patient is DNR/DNI, now transitioning to comfort care, and hospital death anticipated.  ? ?He is NPO. No further nutrition interventions planned at this time. Please re-consult as needed.  ? ? ? ? ?Jonathon Matin, MS, RD, LDN ?Registered Dietitian II ?Inpatient Clinical Nutrition ?RD pager # and on-call/weekend pager # available in [place]  ? ? ?

## 2021-06-17 NOTE — Progress Notes (Signed)
This RN to bedside due to comfort care patient's monitor alarming asystole. No heart sounds or breaths auscultated; no pulse upon palpation. [employer] by Encarnacion Chu. TOD [phone]. Family at bedside.Emotional support provided. Belongings to morgue with patient.  ?

## 2021-06-17 DEATH — deceased

## 2021-06-24 ENCOUNTER — Other Ambulatory Visit (HOSPITAL_COMMUNITY): Payer: Self-pay

## 2021-07-05 ENCOUNTER — Ambulatory Visit: Payer: Medicare Other | Admitting: Internal Medicine

## 2021-07-05 ENCOUNTER — Other Ambulatory Visit: Payer: Medicare Other

## 2021-07-05 ENCOUNTER — Ambulatory Visit: Payer: Medicare Other

## 2021-08-02 ENCOUNTER — Ambulatory Visit: Payer: Self-pay | Admitting: Radiation Oncology

## 2021-11-21 IMAGING — CT NM PET TUM IMG RESTAG (PS) SKULL BASE T - THIGH
7 series · 25 of 25 positions shown · non-contrast
Comparison: Multiple prior studies most recent February 07, 2019

CLINICAL DATA: Subsequent treatment strategy for non-small cell
lung cancer restaging.

EXAM:
NUCLEAR MEDICINE PET SKULL BASE TO THIGH
TECHNIQUE: 10.6 mCi F-18 FDG was injected intravenously. Full-ring PET imaging
was performed from the skull base to thigh after the radiotracer. CT
data was obtained and used for attenuation correction and anatomic
localization.
Fasting blood glucose: 135 mg/dl

[Series 3: pet sk_thigh ac · axial · 5.0mm · 4.07mm/px · z∈[-332,+636]mm · 5 of 243 slices shown]
[im 1/243]
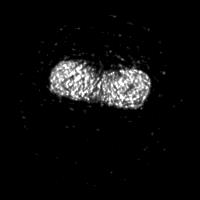
[im 61/243]
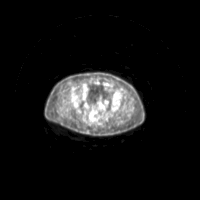
[im 122/243]
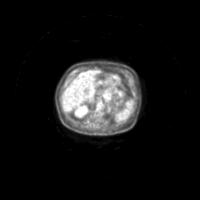
[im 182/243]
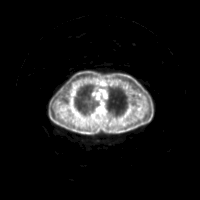
[im 243/243]
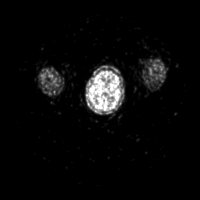

[Series 4: ct sk_thigh 5.0 b31f · axial · 5.0mm · 0.98mm/px · z∈[-332,+636]mm · 5 of 243 slices shown]
[im 1/243]
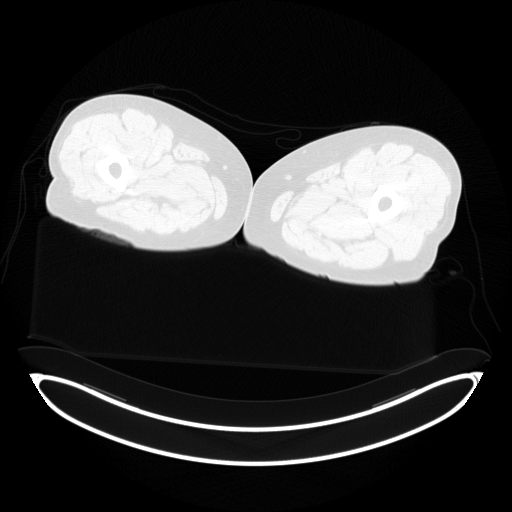
[im 61/243]
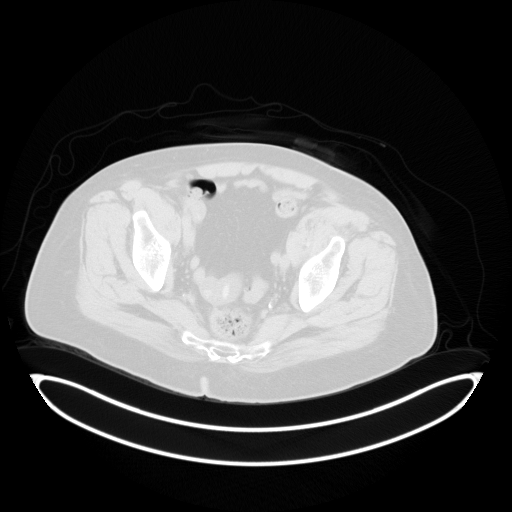
[im 122/243]
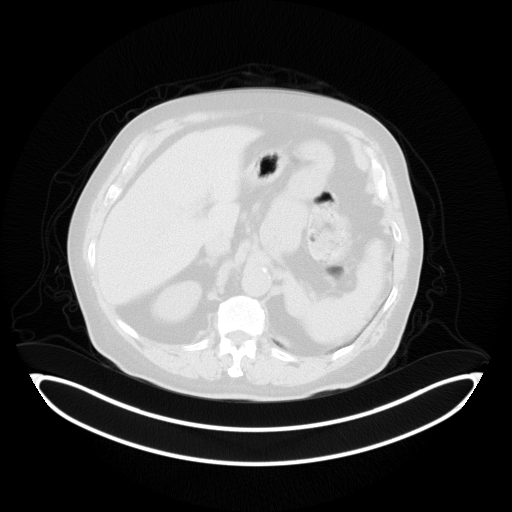
[im 182/243]
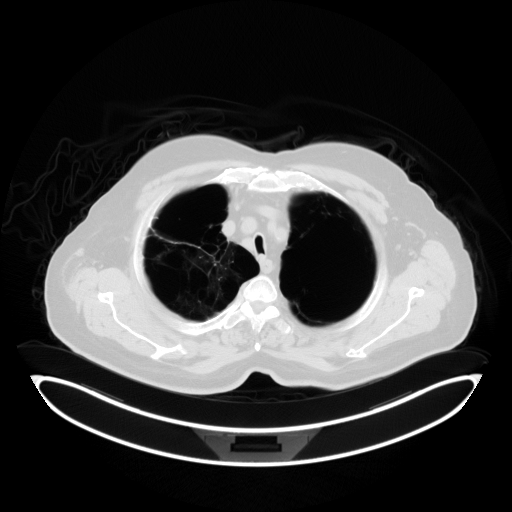
[im 243/243  brain]
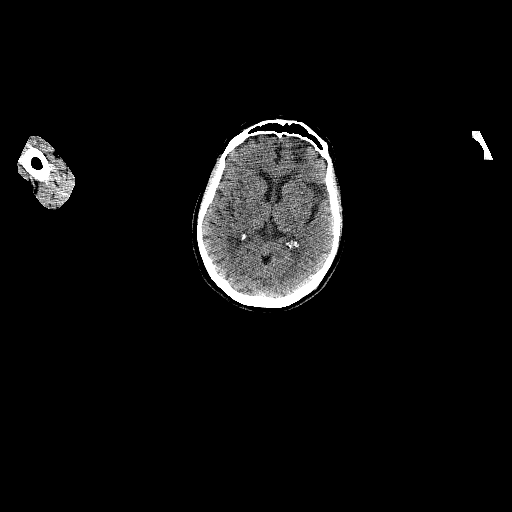

[Series 5: pet sk_thigh nac · axial · 5.0mm · 4.07mm/px · z∈[-332,+636]mm · 5 of 243 slices shown]
[im 1/243]
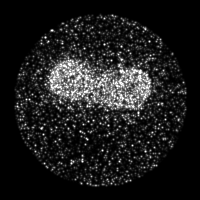
[im 61/243]
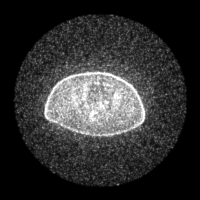
[im 122/243]
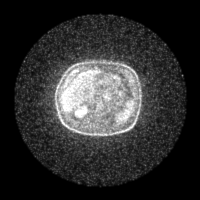
[im 182/243]
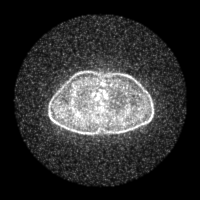
[im 243/243]
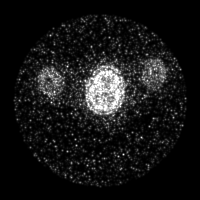

[Series 8: ct sk_thigh 5.0 b70f lung_bone · axial · 5.0mm · 0.98mm/px · z∈[+184,+456]mm · 2 of 69 slices shown]
[im 1/69  bone]
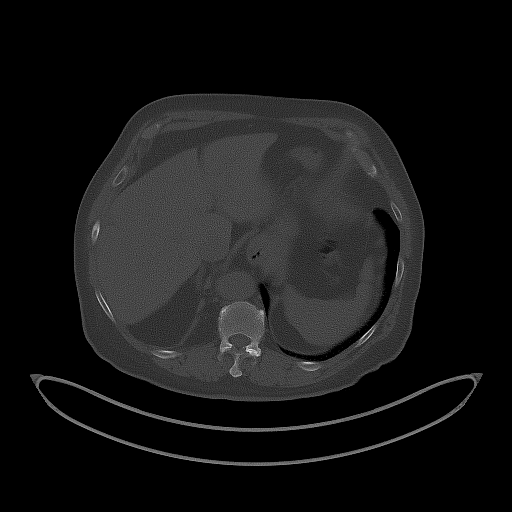
[im 69/69  bone]
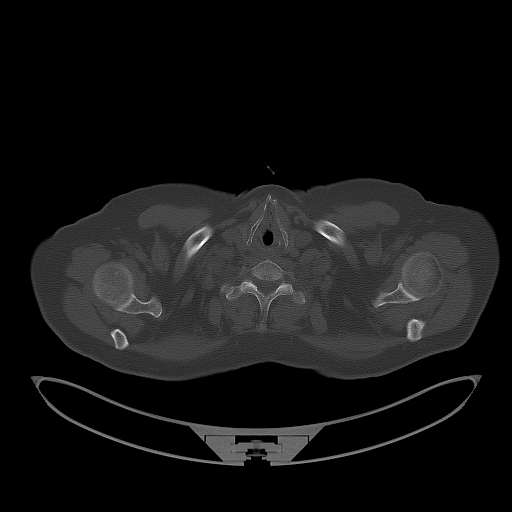

[Series 603: mip range 5 · coronal · 2.01mm/px · 1 of 32 slices shown]
[im 1/32]
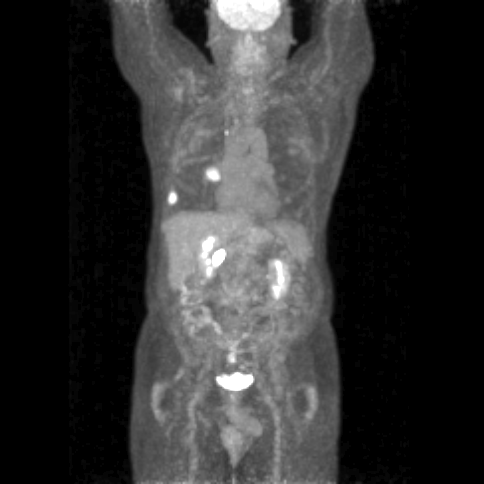

[Series 604: range-ct sk_thigh 5.0 (id)<alpha range> · 2 of 77 slices shown (1 of 2)]
[im 1/77]
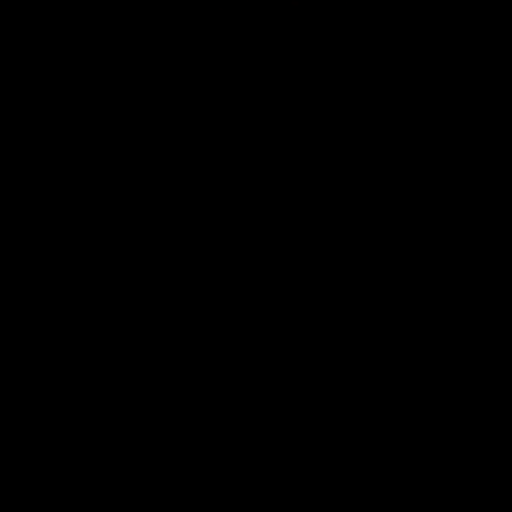
[im 77/77]
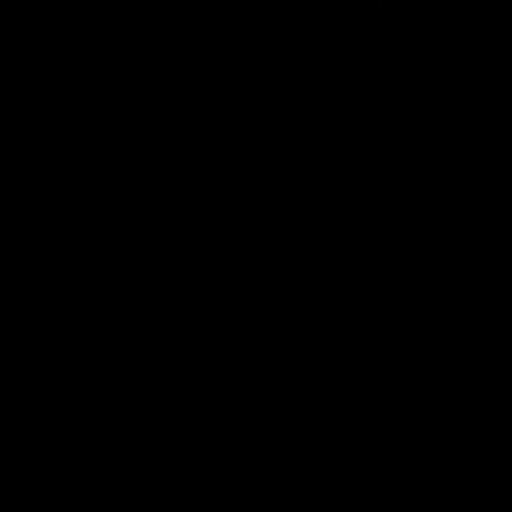

[Series 605: range-ct sk_thigh 5.0 (id)<alpha range> · 5 of 237 slices shown (2 of 2)]
[im 1/237]
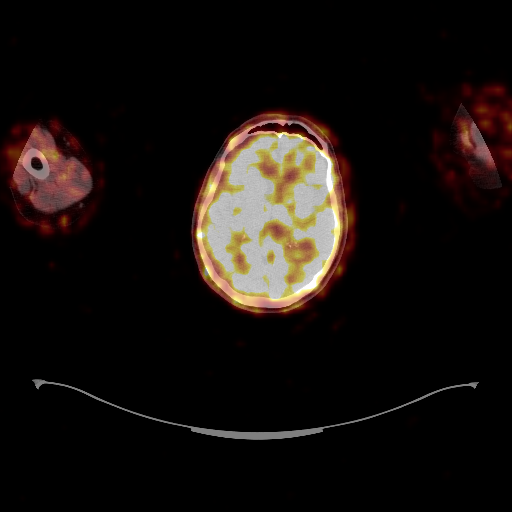
[im 60/237]
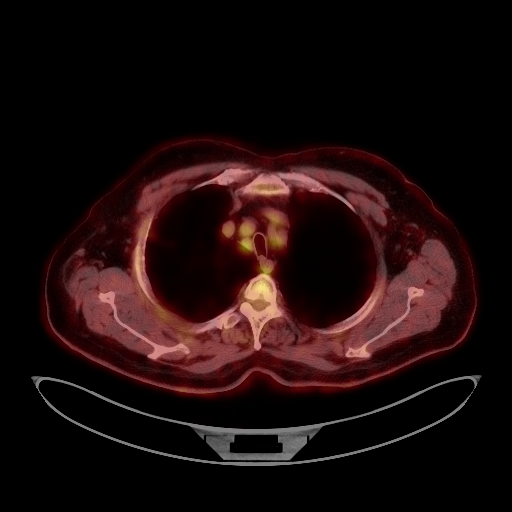
[im 119/237]
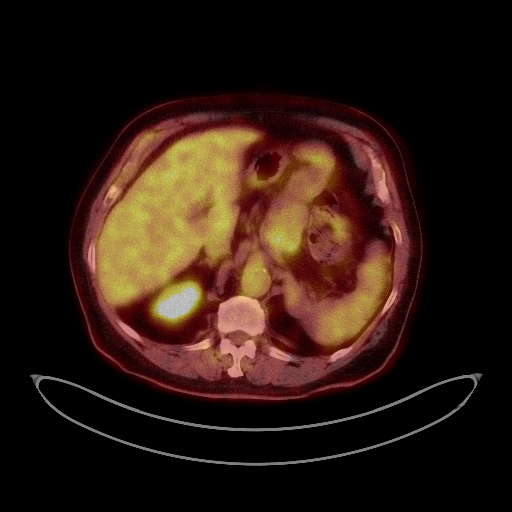
[im 178/237]
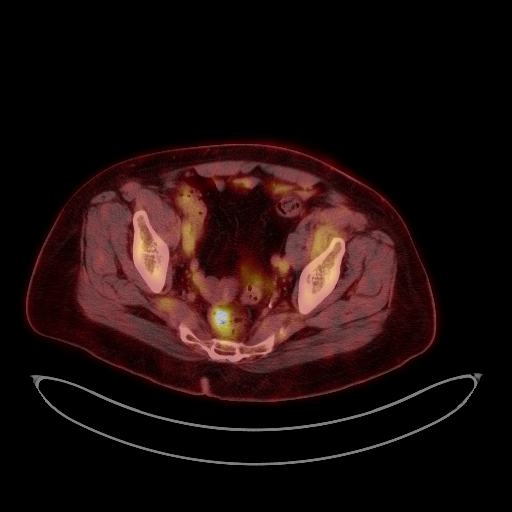
[im 237/237]
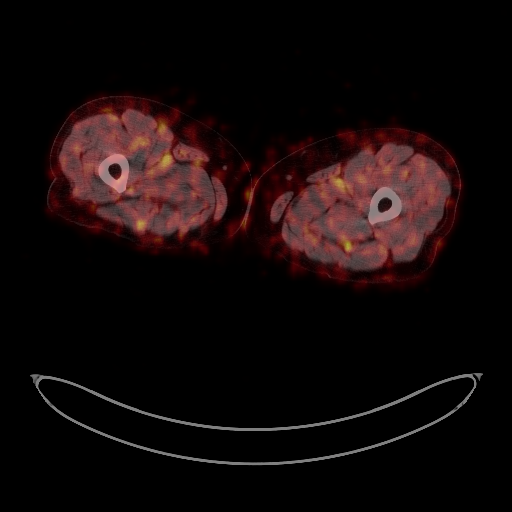

[25 of 25 positions shown; findings below may reference images not displayed]

FINDINGS: Mediastinal blood pool activity: SUV max

Liver activity: SUV max NA

NECK: No hypermetabolic lymph nodes in the neck.

Incidental CT findings: none

CHEST: Resolution of soft tissue associated with parenchymal lesion
in the medial RIGHT chest without area of associated increased FDG
uptake but with new RIGHT juxta hilar, central mass (image 42,
series [DATE] x 2.7 cm as seen on the recent chest CT. (SUVmax =
10.8)

Pleural based lesion also new in the peripheral RIGHT chest on image
54 of series 8 measuring approximately 2 cm (SUVmax = 12.5)

Small RIGHT paratracheal lymph node (image 62, series 4) (SUVmax =
3.3)

New lesion in the RIGHT upper chest (image 23 and 22 of series 8) 8
mm (SUVmax = 5.6)

Incidental CT findings:

postoperative changes in the chest as before.

Nodular area along the LEFT heart border without signs of increased
FDG uptake above blood pool activity and without change since the
previous PET-CT.

Calcified atheromatous plaque throughout the thoracic aorta.
Calcified coronary artery disease. No pericardial effusion.

Severe pulmonary emphysema.

ABDOMEN/PELVIS: No sign of solid organ uptake. Adrenal glands are
normal. Expected renal activity. No retroperitoneal adenopathy mild
celiac nodal uptake (SUVmax = 4.0) (image 127, series 4) 8 mm.

Incidental CT findings: Cholelithiasis. No pericholecystic
stranding. Atheromatous plaque without dilation of the abdominal
aorta. No acute abdominal process. No free fluid in the pelvis.

SKELETON: No focal hypermetabolic activity to suggest skeletal
metastasis.

Incidental CT findings: none
IMPRESSION: 1. Findings of concern discovered on recent chest CT are markedly
hypermetabolic and compatible with disease recurrence.
2. RIGHT paratracheal lymph node indicative of mediastinal
involvement.
3. Postoperative changes and marked pulmonary emphysema as before.
4. RIGHT lower lobe abnormality seen on previous PET-CT has
resolved.
5. Cholelithiasis.

Aortic Atherosclerosis (T1QMN-4S4.4) and Emphysema (T1QMN-FH6.7).

## 2022-07-10 IMAGING — CT CT CHEST W/ CM
2 of 4 series · 15 of 36 positions shown, 18 images · IV contrast (omnipaque)
Comparison: 01/01/2020

CLINICAL DATA: Small-cell lung cancer.  Restaging.

EXAM:
CT CHEST WITH CONTRAST
TECHNIQUE: Multidetector CT imaging of the chest was performed during
intravenous contrast administration.
CONTRAST:  75mL OMNIPAQUE IOHEXOL 300 MG/ML  SOLN

[Series 2: axial st · axial · 0.80mm/px · z∈[-210,+110]mm · 12 of 190 slices shown, 15 images]
[im 15/190  mediastinal]
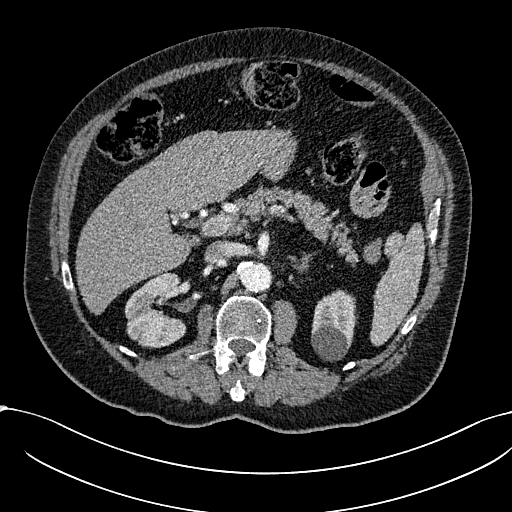
[im 15/190  lung]
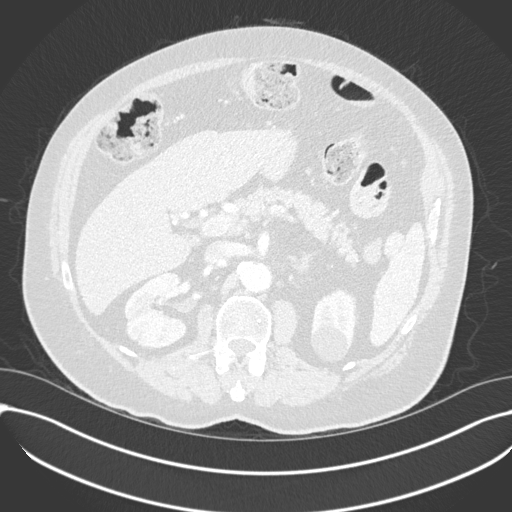
[im 30/190  lung]
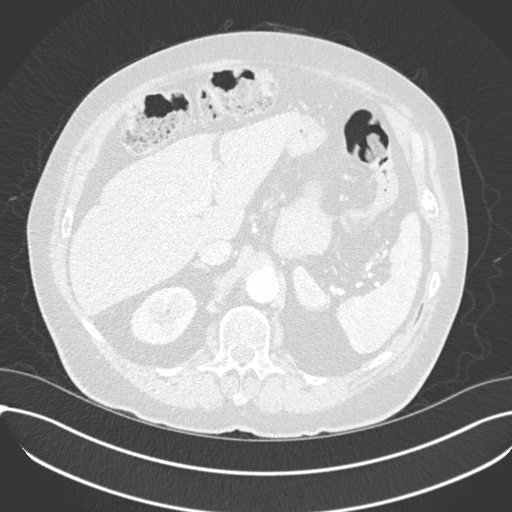
[im 44/190  lung]
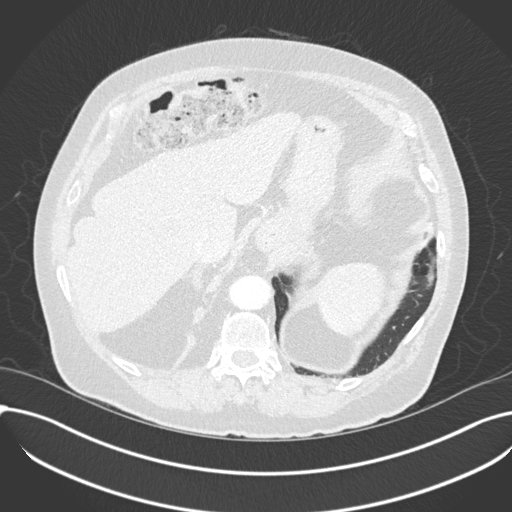
[im 59/190  lung]
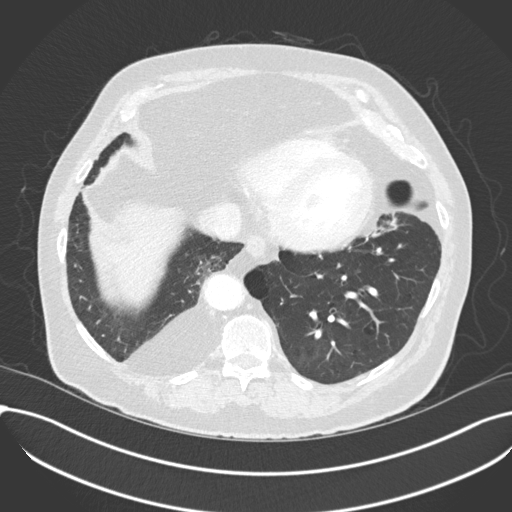
[im 73/190  mediastinal]
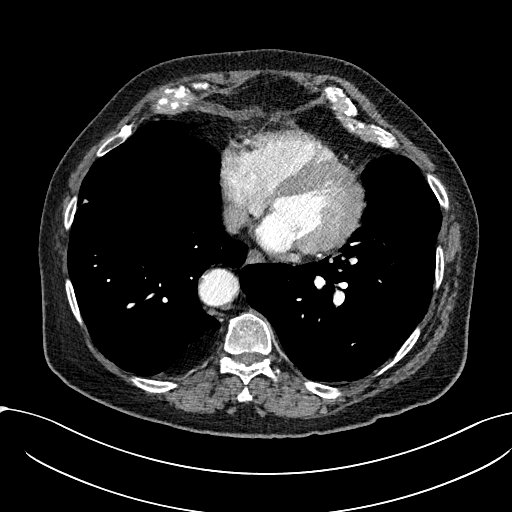
[im 73/190  lung]
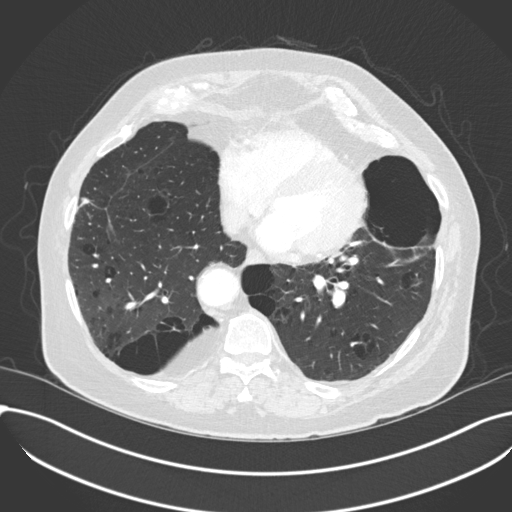
[im 88/190  lung]
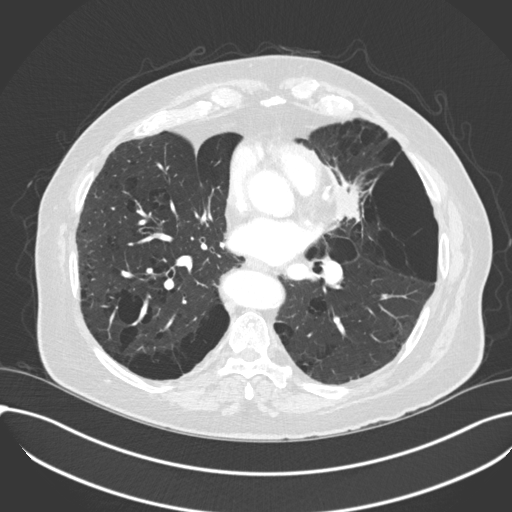
[im 102/190  lung]
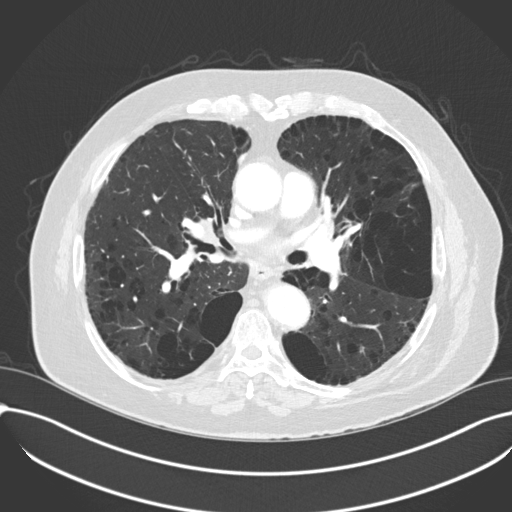
[im 117/190  lung]
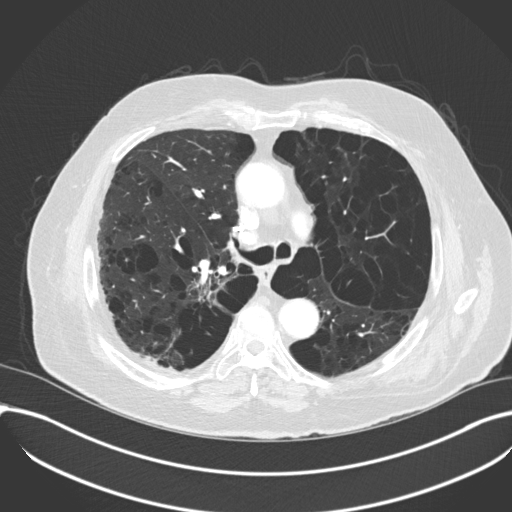
[im 131/190  mediastinal]
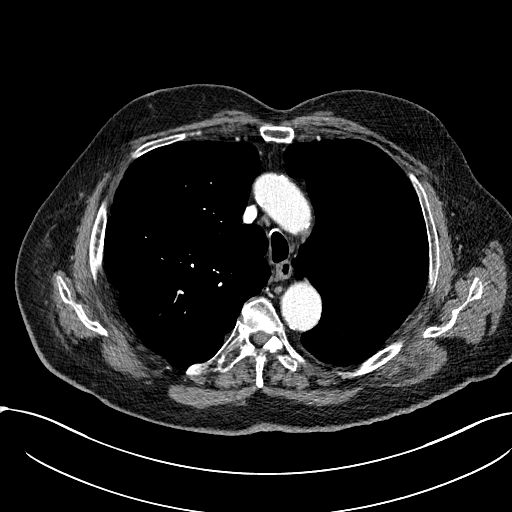
[im 131/190  lung]
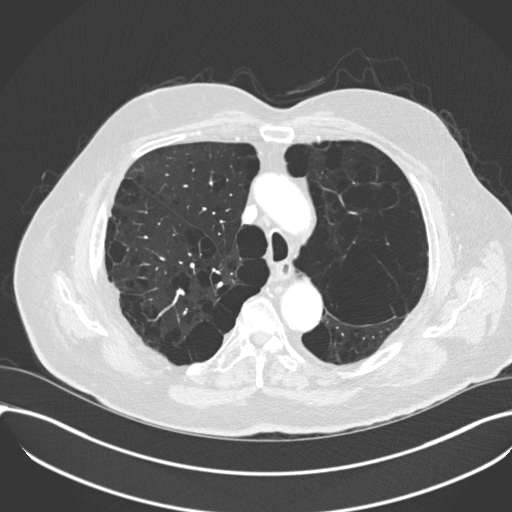
[im 146/190  lung]
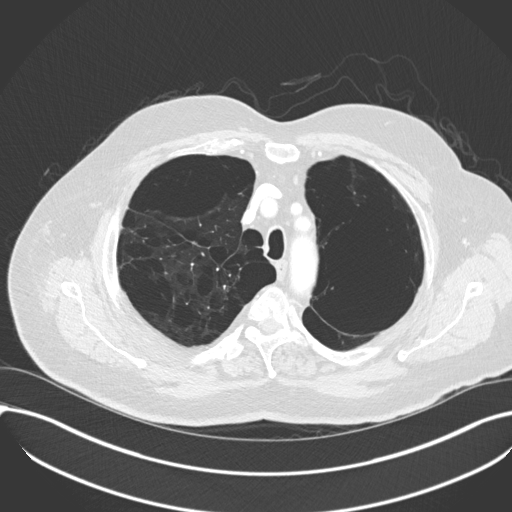
[im 160/190  lung]
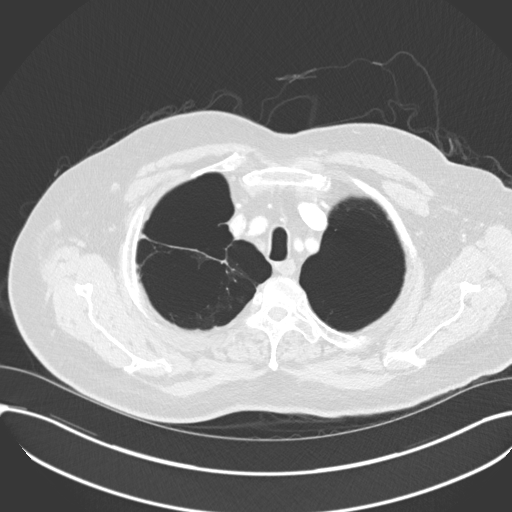
[im 175/190  lung]
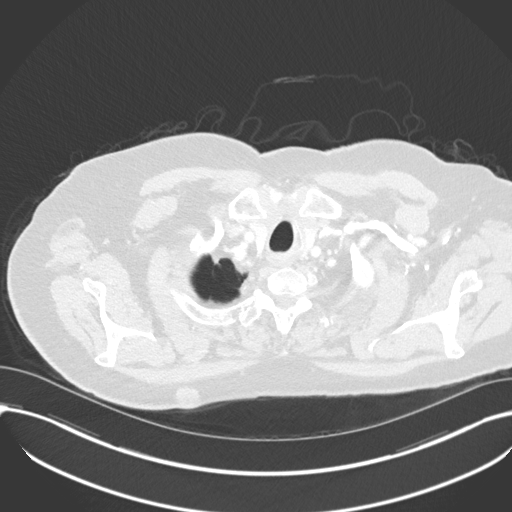

[Series 6: coronal · coronal · 0.76mm/px · 3 of 161 slices shown]
[im 33/161  lung]
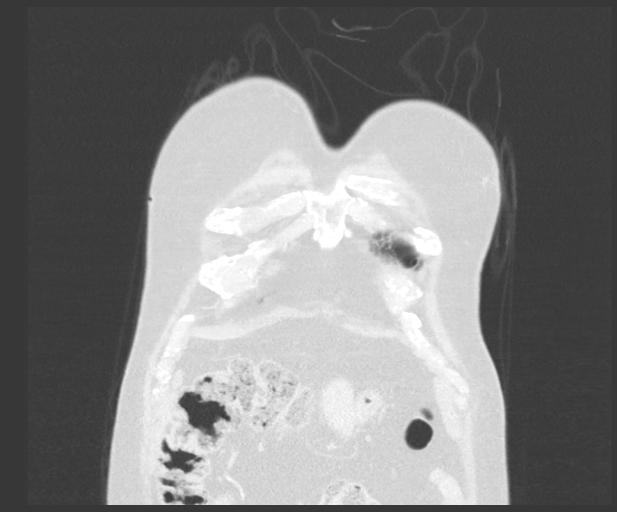
[im 65/161  lung]
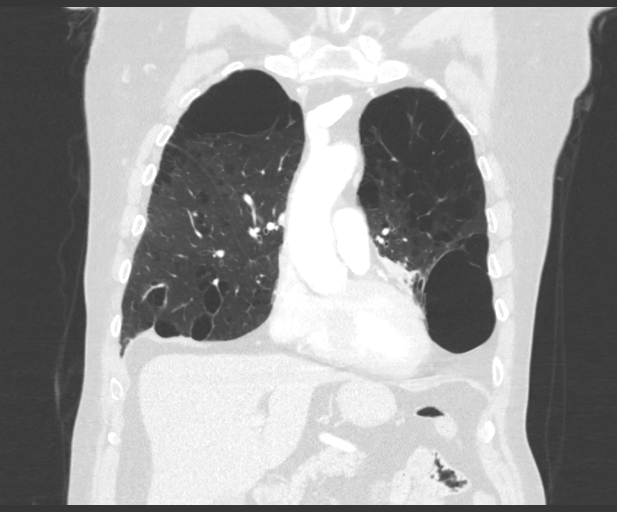
[im 97/161  lung]
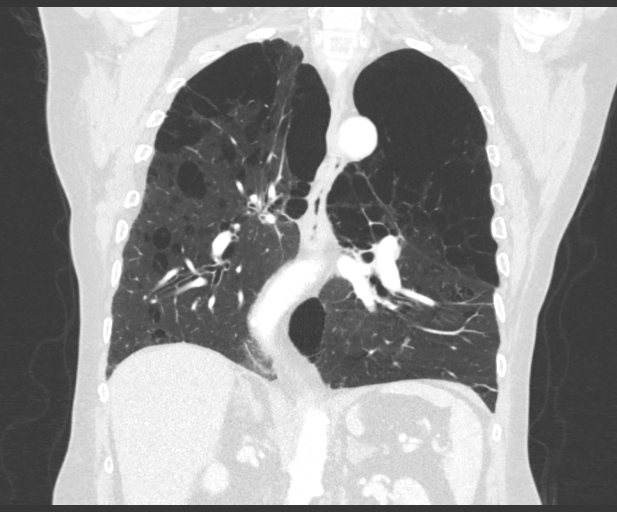

[15 of 36 positions shown; findings below may reference images not displayed]

FINDINGS: Cardiovascular: The heart size is normal. No substantial pericardial
effusion. Coronary artery calcification is evident. Atherosclerotic
calcification is noted in the wall of the thoracic aorta.

Mediastinum/Nodes: No mediastinal lymphadenopathy. There is no hilar
lymphadenopathy. The esophagus has normal imaging features. There is
no axillary lymphadenopathy.

Lungs/Pleura: Extensive changes of emphysema with bullous disease in
the apices bilaterally. 2.3 cm nodular opacity in the medial left
upper lobe is stable, previously characterized as scarring (105/5).

7 mm right lower lobe subpleural nodule described previously is
almost imperceptible today (image 108/5).

9 mm right upper lobe nodule (40/5) was measured previously at 5 mm.
Qualitatively this nodule has more convex margins today than before.

3 mm anterior right lower lobe nodule on 84/5 is new in the
interval.

Upper Abdomen: 13 mm calcified gallstone evident. 3 cm well-defined
homogeneous low-density lesion upper pole left kidney is compatible
with a cyst.

Musculoskeletal: No worrisome lytic or sclerotic osseous
abnormality.
IMPRESSION: 1. Interval increase in size of the 9 mm right upper lobe pulmonary
nodule with more convex margins today. Imaging features raise
concern for neoplasm. Close follow-up recommended with repeat CT
chest in 3 months.
2. New 3 mm anterior right lower lobe pulmonary nodule. Attention on
follow-up recommended.
3. Stable 2.3 cm medial left upper lobe nodular opacity, previously
characterized as scarring.
4. Cholelithiasis.
5. Aortic Atherosclerosis (99W1S-O3A.A) and Emphysema (99W1S-ELN.G).

## 2022-08-19 ENCOUNTER — Other Ambulatory Visit (HOSPITAL_COMMUNITY): Payer: Self-pay

## 2023-08-25 NOTE — Telephone Encounter (Signed)
 SABRA

## 2023-11-15 ENCOUNTER — Other Ambulatory Visit (HOSPITAL_BASED_OUTPATIENT_CLINIC_OR_DEPARTMENT_OTHER): Payer: Self-pay
